# Patient Record
Sex: Female | Born: 1980
Health system: Southern US, Community
[De-identification: ages and names within clinical notes are randomized; demographics above are authoritative.]

## PROBLEM LIST (undated history)

## (undated) ENCOUNTER — Emergency Department (HOSPITAL_COMMUNITY): Payer: Medicaid Other

## (undated) DIAGNOSIS — G43909 Migraine, unspecified, not intractable, without status migrainosus: Secondary | ICD-10-CM

## (undated) DIAGNOSIS — I219 Acute myocardial infarction, unspecified: Secondary | ICD-10-CM

## (undated) DIAGNOSIS — I209 Angina pectoris, unspecified: Secondary | ICD-10-CM

## (undated) DIAGNOSIS — K219 Gastro-esophageal reflux disease without esophagitis: Secondary | ICD-10-CM

## (undated) DIAGNOSIS — G709 Myoneural disorder, unspecified: Secondary | ICD-10-CM

## (undated) DIAGNOSIS — E785 Hyperlipidemia, unspecified: Secondary | ICD-10-CM

## (undated) DIAGNOSIS — Z87442 Personal history of urinary calculi: Secondary | ICD-10-CM

## (undated) DIAGNOSIS — M1990 Unspecified osteoarthritis, unspecified site: Secondary | ICD-10-CM

## (undated) DIAGNOSIS — R0789 Other chest pain: Secondary | ICD-10-CM

## (undated) DIAGNOSIS — N189 Chronic kidney disease, unspecified: Secondary | ICD-10-CM

## (undated) DIAGNOSIS — N132 Hydronephrosis with renal and ureteral calculous obstruction: Secondary | ICD-10-CM

## (undated) DIAGNOSIS — F419 Anxiety disorder, unspecified: Secondary | ICD-10-CM

## (undated) DIAGNOSIS — I509 Heart failure, unspecified: Secondary | ICD-10-CM

## (undated) DIAGNOSIS — R0600 Dyspnea, unspecified: Secondary | ICD-10-CM

## (undated) DIAGNOSIS — T7840XA Allergy, unspecified, initial encounter: Secondary | ICD-10-CM

## (undated) DIAGNOSIS — F329 Major depressive disorder, single episode, unspecified: Secondary | ICD-10-CM

## (undated) DIAGNOSIS — I2510 Atherosclerotic heart disease of native coronary artery without angina pectoris: Secondary | ICD-10-CM

## (undated) DIAGNOSIS — I10 Essential (primary) hypertension: Secondary | ICD-10-CM

## (undated) DIAGNOSIS — G4730 Sleep apnea, unspecified: Secondary | ICD-10-CM

## (undated) DIAGNOSIS — F32A Depression, unspecified: Secondary | ICD-10-CM

## (undated) DIAGNOSIS — D649 Anemia, unspecified: Secondary | ICD-10-CM

## (undated) DIAGNOSIS — N138 Other obstructive and reflux uropathy: Secondary | ICD-10-CM

## (undated) DIAGNOSIS — R002 Palpitations: Secondary | ICD-10-CM

## (undated) DIAGNOSIS — N200 Calculus of kidney: Secondary | ICD-10-CM

## (undated) HISTORY — DX: Anemia, unspecified: D64.9

## (undated) HISTORY — DX: Heart failure, unspecified: I50.9

## (undated) HISTORY — DX: Hyperlipidemia, unspecified: E78.5

## (undated) HISTORY — DX: Essential (primary) hypertension: I10

## (undated) HISTORY — PX: CHOLECYSTECTOMY: SHX55

## (undated) HISTORY — DX: Calculus of kidney: N20.0

## (undated) HISTORY — PX: CARPAL TUNNEL RELEASE: SHX101

## (undated) HISTORY — DX: Other chest pain: R07.89

## (undated) HISTORY — DX: Palpitations: R00.2

## (undated) HISTORY — DX: Other obstructive and reflux uropathy: N13.8

## (undated) HISTORY — DX: Sleep apnea, unspecified: G47.30

## (undated) HISTORY — DX: Major depressive disorder, single episode, unspecified: F32.9

## (undated) HISTORY — PX: OTHER SURGICAL HISTORY: SHX169

## (undated) HISTORY — DX: Atherosclerotic heart disease of native coronary artery without angina pectoris: I25.10

## (undated) HISTORY — DX: Hydronephrosis with renal and ureteral calculous obstruction: N13.2

## (undated) HISTORY — DX: Allergy, unspecified, initial encounter: T78.40XA

## (undated) HISTORY — DX: Depression, unspecified: F32.A

## (undated) SURGERY — CYSTOURETEROSCOPY, WITH STENT INSERTION
Anesthesia: General | Laterality: Left

## (undated) SURGERY — LEFT HEART CATHETERIZATION WITH CORONARY ANGIOGRAM
Anesthesia: Moderate Sedation

---

## 2004-11-18 ENCOUNTER — Observation Stay (HOSPITAL_COMMUNITY): Admission: EM | Admit: 2004-11-18 | Discharge: 2004-11-19 | Payer: Self-pay | Admitting: Emergency Medicine

## 2004-11-18 ENCOUNTER — Ambulatory Visit: Payer: Self-pay | Admitting: Family Medicine

## 2005-05-29 ENCOUNTER — Emergency Department (HOSPITAL_COMMUNITY): Admission: EM | Admit: 2005-05-29 | Discharge: 2005-05-30 | Payer: Self-pay | Admitting: Emergency Medicine

## 2005-06-07 ENCOUNTER — Ambulatory Visit: Payer: Self-pay | Admitting: Orthopedic Surgery

## 2011-09-28 DIAGNOSIS — I2510 Atherosclerotic heart disease of native coronary artery without angina pectoris: Secondary | ICD-10-CM

## 2011-09-28 DIAGNOSIS — I219 Acute myocardial infarction, unspecified: Secondary | ICD-10-CM

## 2011-09-28 HISTORY — DX: Atherosclerotic heart disease of native coronary artery without angina pectoris: I25.10

## 2011-09-28 HISTORY — DX: Acute myocardial infarction, unspecified: I21.9

## 2011-10-08 ENCOUNTER — Encounter (HOSPITAL_COMMUNITY)
Admission: EM | Disposition: A | Discharge status: Home / Self Care | Payer: Self-pay | Source: Ambulatory Visit | Attending: Cardiovascular Disease

## 2011-10-08 ENCOUNTER — Encounter (HOSPITAL_COMMUNITY): Payer: Self-pay | Admitting: Internal Medicine

## 2011-10-08 ENCOUNTER — Inpatient Hospital Stay (HOSPITAL_COMMUNITY)
Admission: EM | Admit: 2011-10-08 | Discharge: 2011-10-11 | DRG: 249 | Disposition: A | Discharge status: Home / Self Care | Payer: Medicaid Other | Source: Ambulatory Visit | Attending: Cardiovascular Disease | Admitting: Cardiovascular Disease

## 2011-10-08 DIAGNOSIS — F17200 Nicotine dependence, unspecified, uncomplicated: Secondary | ICD-10-CM | POA: Diagnosis present

## 2011-10-08 DIAGNOSIS — I252 Old myocardial infarction: Secondary | ICD-10-CM | POA: Diagnosis present

## 2011-10-08 DIAGNOSIS — Z7902 Long term (current) use of antithrombotics/antiplatelets: Secondary | ICD-10-CM

## 2011-10-08 DIAGNOSIS — Z882 Allergy status to sulfonamides status: Secondary | ICD-10-CM

## 2011-10-08 DIAGNOSIS — I2109 ST elevation (STEMI) myocardial infarction involving other coronary artery of anterior wall: Secondary | ICD-10-CM

## 2011-10-08 DIAGNOSIS — E785 Hyperlipidemia, unspecified: Secondary | ICD-10-CM | POA: Diagnosis present

## 2011-10-08 DIAGNOSIS — Z720 Tobacco use: Secondary | ICD-10-CM

## 2011-10-08 DIAGNOSIS — R079 Chest pain, unspecified: Secondary | ICD-10-CM

## 2011-10-08 DIAGNOSIS — Z79899 Other long term (current) drug therapy: Secondary | ICD-10-CM

## 2011-10-08 DIAGNOSIS — Z23 Encounter for immunization: Secondary | ICD-10-CM

## 2011-10-08 DIAGNOSIS — E119 Type 2 diabetes mellitus without complications: Secondary | ICD-10-CM | POA: Diagnosis present

## 2011-10-08 DIAGNOSIS — E1169 Type 2 diabetes mellitus with other specified complication: Secondary | ICD-10-CM | POA: Diagnosis present

## 2011-10-08 DIAGNOSIS — I2510 Atherosclerotic heart disease of native coronary artery without angina pectoris: Secondary | ICD-10-CM | POA: Diagnosis present

## 2011-10-08 DIAGNOSIS — E782 Mixed hyperlipidemia: Secondary | ICD-10-CM | POA: Diagnosis present

## 2011-10-08 DIAGNOSIS — Z7985 Type 2 diabetes mellitus without complications: Secondary | ICD-10-CM

## 2011-10-08 HISTORY — PX: CARDIAC CATHETERIZATION: SHX172

## 2011-10-08 HISTORY — DX: Morbid (severe) obesity due to excess calories: E66.01

## 2011-10-08 HISTORY — PX: LEFT HEART CATHETERIZATION WITH CORONARY ANGIOGRAM: SHX5451

## 2011-10-08 HISTORY — DX: Migraine, unspecified, not intractable, without status migrainosus: G43.909

## 2011-10-08 HISTORY — PX: CORONARY ANGIOPLASTY WITH STENT PLACEMENT: SHX49

## 2011-10-08 SURGERY — LEFT HEART CATHETERIZATION WITH CORONARY ANGIOGRAM
Anesthesia: LOCAL

## 2011-10-08 MED ORDER — BIVALIRUDIN 250 MG IV SOLR
INTRAVENOUS | Status: AC
  Filled 2011-10-08: qty 250

## 2011-10-08 MED ORDER — LIDOCAINE HCL (PF) 1 % IJ SOLN
INTRAMUSCULAR | Status: AC
  Filled 2011-10-08: qty 30

## 2011-10-08 MED ORDER — FENTANYL CITRATE 0.05 MG/ML IJ SOLN
INTRAMUSCULAR | Status: AC
  Filled 2011-10-08: qty 2

## 2011-10-08 MED ORDER — MIDAZOLAM HCL 2 MG/2ML IJ SOLN
INTRAMUSCULAR | Status: AC
  Filled 2011-10-08: qty 2

## 2011-10-08 MED ORDER — HEPARIN (PORCINE) IN NACL 2-0.9 UNIT/ML-% IJ SOLN
INTRAMUSCULAR | Status: AC
  Filled 2011-10-08: qty 2000

## 2011-10-08 MED ORDER — HEPARIN SODIUM (PORCINE) 1000 UNIT/ML IJ SOLN
INTRAMUSCULAR | Status: AC
  Filled 2011-10-08: qty 1

## 2011-10-08 MED ORDER — VERAPAMIL HCL 2.5 MG/ML IV SOLN
INTRAVENOUS | Status: AC
  Filled 2011-10-08: qty 2

## 2011-10-08 MED ORDER — NITROGLYCERIN 0.2 MG/ML ON CALL CATH LAB
INTRAVENOUS | Status: AC
  Filled 2011-10-08: qty 1

## 2011-10-08 NOTE — H&P (Signed)
Cardiology History and Physical   History of Present Illness (and review of medical records): [name] is a 31 y.o. female who presents for evaluation of chest pain. Onset was approximately two hours ago, with persistent course since that time.  The patient admits to chest discomfort that feels like someone sitting on her chest, located in the mid chest with sharp pain radiating to her left arm. Currently rated as a scale of 5/10 in intensity.  Associated symptoms include nausea. Aggravating factors are none.  Alleviating factors are none.  She called EMS and was taken to [facility].  She initially ecg revealed sinus rhythm with PVCs.  Follow up ecg then revealed ST elevation in leads V1- V5.  CODE STEMI was activated.  Patient received Nitro, Zofran, Heparin bolus, Plavix load prior to transport.  She has had no previous cardiac testing.  Past Medical History  Diagnosis Date  . Morbid obesity   . Migraines     Past Surgical History  Procedure Date  . Cholecystectomy   . Cesarean section     No prescriptions prior to admission   Allergies  Allergen Reactions  . Aspirin Other (See Comments)    Tachycardia  . Sulfa Drugs Cross Reactors Other (See Comments)    Unknown    History  Substance Use Topics  . Smoking status: Current Everyday Smoker -- 0.5 packs/day    Types: Cigarettes  . Smokeless tobacco: Not on file  . Alcohol Use: No    Family History  Problem Relation Age of Onset  . Heart attack Father     Review of Systems A comprehensive review of systems was negative other than stated in HPI.  Objective:  No data found.  General Appearance:    Alert, cooperative, mod distress, obese female  Head:    Normocephalic, without obvious abnormality, atraumatic  Eyes:     PERRL, EOMI, anicteric sclerae  Neck:   Supple, no carotid bruit or JVD  Lungs:     Clear to auscultation bilaterally  Heart:    Regular rate and rhythm, S1 and S2 normal   Abdomen:     Soft,  non-tender, normoactive bowel sounds  Extremities:   Extremiteis nontraumatic  Pulses:   2+ and symmetric all extremities  Skin:   no rashes or lesions  Neurologic:   No focal deficits. AAO x3  No results found for this or any previous visit (from the past 48 hour(s)). No results found.  ECG:  First at 21:23 prob sinus rhythm with PVCS HR 89 2nd 21:50 Sinus with ST elevation in V1-V5, ST depression 1, avl  Assessment: Acute Anterior STEMI   Plan:  1. Direct Admit to Cath Lab for cardiac catheterization with likely intervention 2. Received Heparin bolus and Plavix 600mg  prior to arrival 3. Will Admit to CCU post cardiac catheterization 4. Medical management with Plavix/BB/Statin/Nitro prn 5. ASA allergy 6. Check lipids, hgba1c, tsh 6.  Tobacco cessation

## 2011-10-09 ENCOUNTER — Other Ambulatory Visit: Payer: Self-pay

## 2011-10-09 ENCOUNTER — Encounter (HOSPITAL_COMMUNITY): Payer: Self-pay | Admitting: *Deleted

## 2011-10-09 DIAGNOSIS — E785 Hyperlipidemia, unspecified: Secondary | ICD-10-CM | POA: Diagnosis present

## 2011-10-09 DIAGNOSIS — Z720 Tobacco use: Secondary | ICD-10-CM | POA: Diagnosis present

## 2011-10-09 DIAGNOSIS — I2109 ST elevation (STEMI) myocardial infarction involving other coronary artery of anterior wall: Secondary | ICD-10-CM

## 2011-10-09 DIAGNOSIS — E782 Mixed hyperlipidemia: Secondary | ICD-10-CM | POA: Diagnosis present

## 2011-10-09 DIAGNOSIS — I2510 Atherosclerotic heart disease of native coronary artery without angina pectoris: Secondary | ICD-10-CM

## 2011-10-09 DIAGNOSIS — I252 Old myocardial infarction: Secondary | ICD-10-CM | POA: Diagnosis present

## 2011-10-09 DIAGNOSIS — E1169 Type 2 diabetes mellitus with other specified complication: Secondary | ICD-10-CM | POA: Diagnosis present

## 2011-10-09 LAB — BASIC METABOLIC PANEL
BUN: 9 mg/dL (ref 6–23)
CO2: 24 mEq/L (ref 19–32)
Calcium: 8.8 mg/dL (ref 8.4–10.5)
Chloride: 103 mEq/L (ref 96–112)
Creatinine, Ser: 0.59 mg/dL (ref 0.50–1.10)
GFR calc Af Amer: >90 mL/min (ref >90)
GFR calc non Af Amer: >90 mL/min (ref >90)
Glucose, Bld: 170 mg/dL — ABNORMAL HIGH (ref 70–99)
Potassium: 3.7 mEq/L (ref 3.5–5.1)
Sodium: 137 mEq/L (ref 135–145)

## 2011-10-09 LAB — CBC
HCT: 36.8 % (ref 36.0–46.0)
Hemoglobin: 13 g/dL (ref 12.0–15.0)
MCH: 30.7 pg (ref 26.0–34.0)
MCHC: 35.3 g/dL (ref 30.0–36.0)
MCV: 87 fL (ref 78.0–100.0)
Platelets: 207 10*3/uL (ref 150–400)
RBC: 4.23 MIL/uL (ref 3.87–5.11)
RDW: 12.7 % (ref 11.5–15.5)
WBC: 11.9 10*3/uL — ABNORMAL HIGH (ref 4.0–10.5)

## 2011-10-09 LAB — CARDIAC PANEL(CRET KIN+CKTOT+MB+TROPI)
CK, MB: 62.4 ng/mL (ref 0.3–4.0)
CK, MB: 57.1 ng/mL (ref 0.3–4.0)
Relative Index: 8.4 — ABNORMAL HIGH (ref 0.0–2.5)
Relative Index: 8.1 — ABNORMAL HIGH (ref 0.0–2.5)
Total CK: 743 U/L — ABNORMAL HIGH (ref 7–177)
Total CK: 702 U/L — ABNORMAL HIGH (ref 7–177)
Troponin I: 20.07 ng/mL (ref <0.30)
Troponin I: 11.02 ng/mL (ref <0.30)

## 2011-10-09 LAB — GLUCOSE, CAPILLARY
Glucose-Capillary: 140 mg/dL — ABNORMAL HIGH (ref 70–99)
Glucose-Capillary: 138 mg/dL — ABNORMAL HIGH (ref 70–99)

## 2011-10-09 LAB — MRSA PCR SCREENING: MRSA by PCR: NEGATIVE

## 2011-10-09 LAB — LIPID PANEL
Cholesterol: 203 mg/dL — ABNORMAL HIGH (ref 0–200)
HDL: 27 mg/dL — ABNORMAL LOW (ref >39)
LDL Cholesterol: 125 mg/dL — ABNORMAL HIGH (ref 0–99)
Total CHOL/HDL Ratio: 7.5 RATIO
Triglycerides: 257 mg/dL — ABNORMAL HIGH (ref <150)
VLDL: 51 mg/dL — ABNORMAL HIGH (ref 0–40)

## 2011-10-09 LAB — HEMOGLOBIN A1C
Hgb A1c MFr Bld: 7.8 % — ABNORMAL HIGH (ref <5.7)
Mean Plasma Glucose: 177 mg/dL — ABNORMAL HIGH (ref <117)

## 2011-10-09 LAB — PROTIME-INR
INR: 1.17 (ref 0.00–1.49)
Prothrombin Time: 15.1 seconds (ref 11.6–15.2)

## 2011-10-09 LAB — TSH: TSH: 0.998 u[IU]/mL (ref 0.350–4.500)

## 2011-10-09 MED ORDER — NITROGLYCERIN 0.4 MG SL SUBL: 0.4000 mg | SUBLINGUAL_TABLET | SUBLINGUAL | Status: DC | PRN

## 2011-10-09 MED ORDER — ACETAMINOPHEN 325 MG PO TABS
650.0000 mg | ORAL_TABLET | ORAL | Status: DC | PRN
  Administered 2011-10-09 – 2011-10-11 (×3): 650 mg via ORAL
  Filled 2011-10-09 (×3): qty 2

## 2011-10-09 MED ORDER — CARVEDILOL 6.25 MG PO TABS
6.2500 mg | ORAL_TABLET | Freq: Two times a day (BID) | ORAL | Status: DC
  Administered 2011-10-09 – 2011-10-10 (×4): 6.25 mg via ORAL
  Filled 2011-10-09 (×7): qty 1

## 2011-10-09 MED ORDER — ENOXAPARIN SODIUM 100 MG/ML ~~LOC~~ SOLN
90.0000 mg | SUBCUTANEOUS | Status: DC
  Administered 2011-10-09 – 2011-10-10 (×2): 90 mg via SUBCUTANEOUS
  Filled 2011-10-09 (×3): qty 1

## 2011-10-09 MED ORDER — INSULIN ASPART 100 UNIT/ML ~~LOC~~ SOLN
0.0000 [IU] | Freq: Three times a day (TID) | SUBCUTANEOUS | Status: DC
  Administered 2011-10-10: 2 [IU] via SUBCUTANEOUS
  Administered 2011-10-10: 3 [IU] via SUBCUTANEOUS
  Administered 2011-10-10 – 2011-10-11 (×2): 2 [IU] via SUBCUTANEOUS
  Filled 2011-10-09: qty 3

## 2011-10-09 MED ORDER — MORPHINE SULFATE 2 MG/ML IJ SOLN: 2.0000 mg | INTRAMUSCULAR | Status: DC | PRN

## 2011-10-09 MED ORDER — CLOPIDOGREL BISULFATE 75 MG PO TABS
75.0000 mg | ORAL_TABLET | Freq: Every day | ORAL | Status: DC
  Administered 2011-10-09 – 2011-10-11 (×3): 75 mg via ORAL
  Filled 2011-10-09 (×4): qty 1

## 2011-10-09 MED ORDER — SPIRONOLACTONE 12.5 MG HALF TABLET
12.5000 mg | ORAL_TABLET | Freq: Every day | ORAL | Status: DC
  Administered 2011-10-09 – 2011-10-11 (×3): 12.5 mg via ORAL
  Filled 2011-10-09 (×3): qty 1

## 2011-10-09 MED ORDER — SODIUM CHLORIDE 0.9 % IJ SOLN
3.0000 mL | Freq: Two times a day (BID) | INTRAMUSCULAR | Status: AC
  Administered 2011-10-09 – 2011-10-11 (×4): 3 mL via INTRAVENOUS

## 2011-10-09 MED ORDER — NICOTINE 21 MG/24HR TD PT24
21.0000 mg | MEDICATED_PATCH | Freq: Every day | TRANSDERMAL | Status: DC
  Administered 2011-10-09 – 2011-10-10 (×2): 21 mg via TRANSDERMAL
  Filled 2011-10-09 (×3): qty 1

## 2011-10-09 MED ORDER — ROSUVASTATIN CALCIUM 40 MG PO TABS
40.0000 mg | ORAL_TABLET | Freq: Every day | ORAL | Status: DC
  Administered 2011-10-09 – 2011-10-10 (×2): 40 mg via ORAL
  Filled 2011-10-09 (×3): qty 1

## 2011-10-09 MED ORDER — INFLUENZA VIRUS VACC SPLIT PF IM SUSP
0.5000 mL | INTRAMUSCULAR | Status: AC
  Administered 2011-10-10: 0.5 mL via INTRAMUSCULAR
  Filled 2011-10-09: qty 0.5

## 2011-10-09 MED ORDER — PNEUMOCOCCAL VAC POLYVALENT 25 MCG/0.5ML IJ INJ
0.5000 mL | INJECTION | INTRAMUSCULAR | Status: AC
  Administered 2011-10-10: 0.5 mL via INTRAMUSCULAR
  Filled 2011-10-09: qty 0.5

## 2011-10-09 MED ORDER — SODIUM CHLORIDE 0.9 % IV SOLN
1.0000 mL/kg/h | INTRAVENOUS | Status: AC
  Administered 2011-10-09: 1.307 mL/kg/h via INTRAVENOUS

## 2011-10-09 MED ORDER — ONDANSETRON HCL 4 MG/2ML IJ SOLN
4.0000 mg | Freq: Four times a day (QID) | INTRAMUSCULAR | Status: DC | PRN
  Administered 2011-10-09: 4 mg via INTRAVENOUS

## 2011-10-09 MED ORDER — ONDANSETRON HCL 4 MG/2ML IJ SOLN
INTRAMUSCULAR | Status: AC
  Administered 2011-10-09: 4 mg via INTRAVENOUS
  Filled 2011-10-09: qty 2

## 2011-10-09 MED ORDER — METOPROLOL TARTRATE 25 MG PO TABS
25.0000 mg | ORAL_TABLET | Freq: Two times a day (BID) | ORAL | Status: DC
Start: 2011-10-09 — End: 2011-10-09

## 2011-10-09 NOTE — Progress Notes (Signed)
CARDIAC REHAB PHASE I   PRE:  Rate/Rhythm: 98SR  BP:  Supine: 139/77  Sitting:   Standing:    SaO2: 98%2L  MODE:  Ambulation: 150 ft   POST:  Rate/Rhythem: 103ST  BP:  Supine:   Sitting: 147/92  Standing:    SaO2: 95%RA 0900-1010 [name] with light pressure in chest but not pain as on adm. Walked 150 ft with asst x 1 with very slow pace. To recliner with call bell. Discussed Phase 2 and Katelyn gave permission to refer to [facility]. Encourage smoking cessation and gave handout. Katelyn was craving 4 on scale 0-4 so asked RN to check on nicotine patch. Began ed. Katelyn needs lots of reenforcement. Left diet handout and will go over diet on Monday.  Duanne Limerick

## 2011-10-09 NOTE — Progress Notes (Addendum)
Subjective:   [name] is a 31 year old white female with history of tobacco abuse and morbid obesity who was transferred from [facility] for an acute ST elevation anterior myocardial infarction on 10/08/11. She has 2 mm of ST elevation anterior precordial leads.   Cath with high grade lesion in LAD and Ramus. LAD treated with BMS. Ramus with cutting balloon. EF 55%.  Doing OK. Mild residual CP. Mild nausea. No CP or SOB. Radial site ok    Intake/Output Summary (Last 24 hours) at 10/09/11 0828 Last data filed at 10/09/11 0700  Gross per 24 hour  Intake   1098 ml  Output    850 ml  Net    248 ml    Current meds:    . bivalirudin      . clopidogrel  75 mg Oral Q breakfast  . fentaNYL      . heparin      . heparin      . influenza  inactive virus vaccine  0.5 mL Intramuscular Tomorrow-1000  . lidocaine      . metoprolol tartrate  25 mg Oral BID  . midazolam      . nitroGLYCERIN      . pneumococcal 23 valent vaccine  0.5 mL Intramuscular Tomorrow-1000  . rosuvastatin  40 mg Oral q1800  . verapamil       Infusions:    . sodium chloride 1.307 mL/kg/hr (10/09/11 0200)     Objective:  Blood pressure 136/73, pulse 80, temperature 98.5 F (36.9 C), temperature source Oral, resp. rate 18, height 5\' 10"  (1.778 m), weight 183 kg (403 lb 7.1 oz), SpO2 91.00%. Weight change:   Physical Exam: General:  Obese.No resp difficulty HEENT: normal. + hirsuit Neck: supple. JVP unable to see . Carotids 2+ bilat; no bruits. No lymphadenopathy or thryomegaly appreciated. Cor: PMI nonpalpable Regular rate & rhythm. No rubs, gallops or murmurs. Lungs: clear Abdomen: obese soft, nontender, nondistended.  Extremities: no cyanosis, clubbing, rash, tr edema. r radial site ok Neuro: alert & orientedx3, cranial nerves grossly intact. moves all 4 extremities w/o difficulty. Affect pleasant  Telemetry:  Sinus rhythm  Lab Results: Basic Metabolic Panel: No results found for this  basename: NA:5,K:2,CL:5,CO2:5,GLUCOSE:5,BUN:5,CREATININE:5,CALCIUM:5,MG:5,PHOS:5 in the last 168 hours Liver Function Tests: No results found for this basename: AST:5,ALT:5,ALKPHOS:5,BILITOT:5,PROT:5,ALBUMIN:5 in the last 168 hours No results found for this basename: LIPASE:5,AMYLASE:5 in the last 168 hours No results found for this basename: AMMONIA:5 in the last 168 hours CBC:  Lab 10/09/11 0630  WBC 11.9*  NEUTROABS --  HGB 13.0  HCT 36.8  MCV 87.0  PLT 207   Cardiac Enzymes:  Lab 10/09/11 0600  CKTOTAL 743*  CKMB 62.4*  CKMBINDEX --  TROPONINI 20.07*   BNP: No components found with this basename: POCBNP:5 CBG: No results found for this basename: GLUCAP:5 in the last 168 hours Microbiology: No results found for this basename: cult   No results found for this basename: CULT:2,SDES:2 in the last 168 hours  Imaging: No results found.  ECG:  SR 83. Evolving anterolateral MI. No ST elevation.   ASSESSMENT:   1. Anterior STEMI 1/11    --s/p BMS to LAD and cutting balloon of ramus 2. Morbid obesity 3. Tobacco abuse 4. Probable OSA  PLAN/DISCUSSION:  Doing OK. Continue b-blocker, statin and plavix. Cardiac rehab to see. Given Top >20 and ECG findings suspect EF will not remain 55%. Check echo. Change lopressor to carvedilol. Add low dose spiro.    Need for  smoking cessation reinforced. Will check CE to make sure CE coming down. If CE coming down can transfer to tele. Will need outpt sleep study.   LOS: 1 day    Arvilla Meres, MD 10/09/2011, 8:28 AM

## 2011-10-09 NOTE — Op Note (Signed)
Cardiac Catheterization Procedure Note  Name: [name] MRN: [id number] DOB: [birth year]  Procedure: Left Heart Cath, Selective Coronary Angiography, LV angiography, PTCA and stenting of the Mid LAD, cutting balloon angioplasty of the ramus intermediate   Indication: 31 year old white female with history of tobacco abuse and morbid obesity who is transferred from [facility] for an acute ST elevation anterior myocardial infarction. She has 2 mm of ST elevation anterior precordial leads. She has ongoing chest pain.  Procedural Details:  The right wrist was prepped, draped, and anesthetized with 1% lidocaine. Using the modified Seldinger technique, a 5 French sheath was introduced into the right radial artery. 3 mg of verapamil was administered through the sheath, Angiomax was administered intravenously. Standard Judkins catheters were used for selective coronary angiography and left ventriculography. Catheter exchanges were performed over an exchange length guidewire.  PROCEDURAL FINDINGS Hemodynamics: AO  135/92 with a mean of 114 mmHg LV 136/25 mmHg   Coronary angiography: Coronary dominance: Left  Left mainstem: Normal.  Left anterior descending (LAD): There is a 95% stenosis in the mid LAD immediately after the takeoff of the first diagonal. The first diagonal branch is moderate in size and appears normal. The LAD stenosis does appear to be hazy.  There is a moderately large ramus intermediate branch which has a 95% ostial stenosis.  Left circumflex (LCx): This is a dominant vessel and appears normal throughout.  Right coronary artery (RCA): This is a small nondominant vessel and is normal.  Left ventriculography: Left ventricular systolic function is abnormal with apical hypokinesis. LVEF is estimated at 55%, there is no significant mitral regurgitation   PCI Note:  Following the diagnostic procedure, the decision was made to proceed with PCI of the LAD.  Weight-based  bivalirudin was given for anticoagulation. Once a therapeutic ACT was achieved, a 5 [place] EBU guide catheter was inserted.  A pro-water coronary guidewire was used to cross the lesion.  The lesion was predilated with a 2.5 mm balloon.  The lesion was then stented with a 3.0 x 18 mm vision stent.  The stent was postdilated with a 3.25 mm noncompliant balloon.  Following PCI, there was 0% residual stenosis and TIMI-3 flow. At this point the patient's pain was improved but was Tauer of moderate intensity. We then proceeded to intervene on the ostial ramus intermediate lesion. This was crossed with the prowater wire. This was dilated using a 3.0 x 10 mm cutting balloon performing 3 inflations to 6 atmospheres. This showed a good angiographic result with less than 30% residual stenosis and TIMI grade 3 flow. Final angiography confirmed an excellent result. The patient tolerated the procedure well. There were no immediate procedural complications. A TR band was used for radial hemostasis. The patient was transferred to the post catheterization recovery area for further monitoring.  PCI Data: Vessel -  LAD/Segment - mid vessel immediately after the takeoff of the first diagonal. Percent Stenosis (pre)  95% TIMI-flow 3. Stent 3.0 x 18 mm vision stent. Percent Stenosis (post) 0% TIMI-flow (post) 3.  Second vessel: Ostial ramus intermediate branch Percent stenosis: 95%. TIMI flow 3. 3.0 mm cutting balloon Percent stenosis (post) less than 30% TIMI flow 3  Final Conclusions:   1. Severe 2 vessel obstructive coronary disease. 2. Overall well preserved LV systolic function with apical wall motion abnormality and ejection fraction of 55%. 3. Successful intracoronary stenting of the mid LAD with a bare-metal stent. 4. Successful cutting balloon angioplasty of the ramus intermediate branch.  Recommendations:  Continue therapy with Plavix. Patient reports a history of allergy to aspirin. Aggressive risk  factor modification.  Disposition: Patient be observed in the intensive care unit tonight. If she has no complications she should be able to be transferred to telemetry in the morning.  Theron Arista [facility] 10/09/2011, 12:20 AM

## 2011-10-09 NOTE — Progress Notes (Signed)
ANTICOAGULATION CONSULT NOTE - Initial Consult  Pharmacy Consult for Lovenox Indication: VTE prophylaxis  Allergies  Allergen Reactions  . Aspirin Other (See Comments)    Tachycardia  . Sulfa Drugs Cross Reactors Other (See Comments)    Unknown    Patient Measurements: Height: 5\' 10"  (177.8 cm) Weight: 403 lb 7.1 oz (183 kg) IBW/kg (Calculated) : 68.5  Adjusted Body Weight:   Vital Signs: Temp: 98.5 F (36.9 C) (01/12 0812) Temp src: Oral (01/12 0812) BP: 146/80 mmHg (01/12 1001) Pulse Rate: 84  (01/12 1000)  Labs:  Basename 10/09/11 0630 10/09/11 0600 10/09/11 0500  HGB 13.0 -- --  HCT 36.8 -- --  PLT 207 -- --  APTT -- -- --  LABPROT -- -- 15.1  INR -- -- 1.17  HEPARINUNFRC -- -- --  CREATININE 0.59 -- --  CKTOTAL -- 743* --  CKMB -- 62.4* --  TROPONINI -- 20.07* --   Estimated Creatinine Clearance: 182.2 ml/min (by C-G formula based on Cr of 0.59).  Medical History: Past Medical History  Diagnosis Date  . Morbid obesity   . Migraines     Assessment: 30 yoWF with hx tobacco abuse and morbid obesity s/p cardiac cath for STEMI on 01/11.   Pharmacy asked to dose lovenox for VTE prophylaxis.  CrCl 182 ml/min.  BMI 57.8 (weight 183 kg, height 5'10").  Will dose LMWH as 0.5mg /kg q 24h.  No bleeding noted.   Pt also on plavix,    Plan:  1.  Lovenox 90mg  SQ q 24 hours 2.  F/u renal fxn, duration, clinical course.    Haynes Hoehn, PharmD 10/09/2011 11:39 AM  Pager: [phone]

## 2011-10-10 DIAGNOSIS — E119 Type 2 diabetes mellitus without complications: Secondary | ICD-10-CM

## 2011-10-10 DIAGNOSIS — I369 Nonrheumatic tricuspid valve disorder, unspecified: Secondary | ICD-10-CM

## 2011-10-10 DIAGNOSIS — I2109 ST elevation (STEMI) myocardial infarction involving other coronary artery of anterior wall: Principal | ICD-10-CM

## 2011-10-10 DIAGNOSIS — Z7985 Type 2 diabetes mellitus without complications: Secondary | ICD-10-CM

## 2011-10-10 LAB — BASIC METABOLIC PANEL
BUN: 8 mg/dL (ref 6–23)
CO2: 25 mEq/L (ref 19–32)
Calcium: 9.1 mg/dL (ref 8.4–10.5)
Chloride: 100 mEq/L (ref 96–112)
Creatinine, Ser: 0.61 mg/dL (ref 0.50–1.10)
GFR calc Af Amer: >90 mL/min (ref >90)
GFR calc non Af Amer: >90 mL/min (ref >90)
Glucose, Bld: 263 mg/dL — ABNORMAL HIGH (ref 70–99)
Potassium: 3.6 mEq/L (ref 3.5–5.1)
Sodium: 136 mEq/L (ref 135–145)

## 2011-10-10 LAB — GLUCOSE, CAPILLARY
Glucose-Capillary: 151 mg/dL — ABNORMAL HIGH (ref 70–99)
Glucose-Capillary: 129 mg/dL — ABNORMAL HIGH (ref 70–99)
Glucose-Capillary: 177 mg/dL — ABNORMAL HIGH (ref 70–99)

## 2011-10-10 MED ORDER — LISINOPRIL 5 MG PO TABS
5.0000 mg | ORAL_TABLET | Freq: Every day | ORAL | Status: DC
  Administered 2011-10-10 – 2011-10-11 (×2): 5 mg via ORAL
  Filled 2011-10-10 (×3): qty 1

## 2011-10-10 MED ORDER — METFORMIN HCL 500 MG PO TABS
500.0000 mg | ORAL_TABLET | Freq: Two times a day (BID) | ORAL | Status: DC
  Administered 2011-10-11: 500 mg via ORAL
  Filled 2011-10-10 (×3): qty 1

## 2011-10-10 NOTE — Progress Notes (Signed)
In process of being transferred to 2900 via w/c with monitor

## 2011-10-10 NOTE — Progress Notes (Signed)
  Echocardiogram 2D Echocardiogram has been performed.  [name] [name] 10/10/2011, 10:12 AM

## 2011-10-10 NOTE — Progress Notes (Signed)
See note from earlier today.  Nurse called needing order for transfer.  Ok to transfer to tele. Tereso Newcomer, PA-C  8:45 PM 10/10/2011

## 2011-10-10 NOTE — Progress Notes (Signed)
Subjective:   [name] is a 31 year old white female with history of tobacco abuse and morbid obesity who was transferred from [facility] for an acute ST elevation anterior myocardial infarction on evening of 10/08/11. She has 2 mm of ST elevation anterior precordial leads.   Cath with high grade lesion in LAD and Ramus. LAD treated with BMS. Ramus with cutting balloon. EF 55%.  Improving. Feels much better! Walking halls with Cardiac Rehab. Cardiac enzymes washing out slowly. No further CP/SOB or nausea.  CBGs 140-150s.    Intake/Output Summary (Last 24 hours) at 10/10/11 0855 Last data filed at 10/10/11 0400  Gross per 24 hour  Intake   1746 ml  Output   1600 ml  Net    146 ml    Current meds:    . carvedilol  6.25 mg Oral BID WC  . clopidogrel  75 mg Oral Q breakfast  . enoxaparin (LOVENOX) injection  90 mg Subcutaneous Q24H  . influenza  inactive virus vaccine  0.5 mL Intramuscular Tomorrow-1000  . insulin aspart  0-15 Units Subcutaneous TID WC  . nicotine  21 mg Transdermal Daily  . pneumococcal 23 valent vaccine  0.5 mL Intramuscular Tomorrow-1000  . rosuvastatin  40 mg Oral q1800  . sodium chloride  3 mL Intravenous Q12H  . spironolactone  12.5 mg Oral Daily   Infusions:    . sodium chloride 1.307 mL/kg/hr (10/09/11 0200)     Objective:  Blood pressure 118/77, pulse 82, temperature 98 F (36.7 C), temperature source Oral, resp. rate 18, height 5\' 10"  (1.778 m), weight 183 kg (403 lb 7.1 oz), SpO2 98.00%. Weight change:   Physical Exam: General:  Obese.No resp difficulty HEENT: normal. + hirsuit Neck: supple. JVP unable to see . Carotids 2+ bilat; no bruits. No lymphadenopathy or thryomegaly appreciated. Cor: PMI nonpalpable Regular rate & rhythm. No rubs, gallops or murmurs. Lungs: clear Abdomen: obese soft, nontender, nondistended.  Extremities: no cyanosis, clubbing, rash, tr edema. r radial site ok Neuro: alert & orientedx3, cranial nerves grossly  intact. moves all 4 extremities w/o difficulty. Affect pleasant  Telemetry:  Sinus rhythm  Lab Results: Basic Metabolic Panel:  Lab 10/09/11 6578  NA 137  K 3.7  CL 103  CO2 24  GLUCOSE 170*  BUN 9  CREATININE 0.59  CALCIUM 8.8  MG --  PHOS --   Liver Function Tests: No results found for this basename: AST:5,ALT:5,ALKPHOS:5,BILITOT:5,PROT:5,ALBUMIN:5 in the last 168 hours No results found for this basename: LIPASE:5,AMYLASE:5 in the last 168 hours No results found for this basename: AMMONIA:5 in the last 168 hours CBC:  Lab 10/09/11 0630  WBC 11.9*  NEUTROABS --  HGB 13.0  HCT 36.8  MCV 87.0  PLT 207   Cardiac Enzymes:  Lab 10/09/11 1140 10/09/11 0600  CKTOTAL 702* 743*  CKMB 57.1* 62.4*  CKMBINDEX -- --  TROPONINI 11.02* 20.07*   BNP: No components found with this basename: POCBNP:5 CBG:  Lab 10/09/11 2113 10/09/11 1741  GLUCAP 138* 140*   Microbiology: No results found for this basename: cult   No results found for this basename: CULT:2,SDES:2 in the last 168 hours  Imaging: No results found.  ECG:  SR 83. Evolving anterolateral MI. No ST elevation.   ASSESSMENT:   1. Anterior STEMI 1/11    --s/p BMS to LAD and cutting balloon of ramus 2. Morbid obesity 3. Tobacco abuse 4. Probable OSA 5. DM2, new diagnosis  PLAN/DISCUSSION:  Doing well. Continue b-blocker, statin and  plavix.  Given Trop >20 and ECG findings suspect EF will not remain 55%. Check echo.   Transfer tele. Start metformin. Check Hgba1c. Will need outpt sleep study and PCP. Home in am.   LOS: 2 days    Arvilla Meres, MD 10/10/2011, 8:55 AM

## 2011-10-11 ENCOUNTER — Telehealth: Payer: Self-pay | Admitting: Cardiology

## 2011-10-11 ENCOUNTER — Telehealth: Payer: Self-pay | Admitting: *Deleted

## 2011-10-11 LAB — TSH: TSH: 1.17 u[IU]/mL (ref 0.350–4.500)

## 2011-10-11 LAB — POCT ACTIVATED CLOTTING TIME: Activated Clotting Time: 457 seconds

## 2011-10-11 LAB — GLUCOSE, CAPILLARY
Glucose-Capillary: 144 mg/dL — ABNORMAL HIGH (ref 70–99)
Glucose-Capillary: 135 mg/dL — ABNORMAL HIGH (ref 70–99)

## 2011-10-11 LAB — HEMOGLOBIN A1C
Hgb A1c MFr Bld: 7.4 % — ABNORMAL HIGH (ref <5.7)
Mean Plasma Glucose: 166 mg/dL — ABNORMAL HIGH (ref <117)

## 2011-10-11 MED ORDER — METFORMIN HCL 500 MG PO TABS: 500.0000 mg | ORAL_TABLET | Freq: Two times a day (BID) | ORAL | Status: DC

## 2011-10-11 MED ORDER — CARVEDILOL 12.5 MG PO TABS: 12.5000 mg | ORAL_TABLET | Freq: Two times a day (BID) | ORAL | Status: DC

## 2011-10-11 MED ORDER — CLOPIDOGREL BISULFATE 75 MG PO TABS: 75.0000 mg | ORAL_TABLET | Freq: Every day | ORAL | Status: DC

## 2011-10-11 MED ORDER — NITROGLYCERIN 0.4 MG SL SUBL: 0.4000 mg | SUBLINGUAL_TABLET | SUBLINGUAL | Status: DC | PRN

## 2011-10-11 MED ORDER — VARENICLINE TARTRATE 0.5 MG PO TABS: 0.5000 mg | ORAL_TABLET | Freq: Every day | ORAL | Status: DC

## 2011-10-11 MED ORDER — ROSUVASTATIN CALCIUM 40 MG PO TABS: 40.0000 mg | ORAL_TABLET | Freq: Every day | ORAL | Status: DC

## 2011-10-11 MED ORDER — NICOTINE 21 MG/24HR TD PT24: 1.0000 | MEDICATED_PATCH | Freq: Every day | TRANSDERMAL | Status: DC

## 2011-10-11 MED ORDER — CARVEDILOL 12.5 MG PO TABS
12.5000 mg | ORAL_TABLET | Freq: Two times a day (BID) | ORAL | Status: DC
  Administered 2011-10-11: 12.5 mg via ORAL
  Filled 2011-10-11 (×3): qty 1

## 2011-10-11 MED ORDER — NICOTINE 21 MG/24HR TD PT24: MEDICATED_PATCH | TRANSDERMAL | Status: DC

## 2011-10-11 MED ORDER — LISINOPRIL 5 MG PO TABS: 5.0000 mg | ORAL_TABLET | Freq: Every day | ORAL | Status: DC

## 2011-10-11 MED ORDER — ATORVASTATIN CALCIUM 80 MG PO TABS: 80.0000 mg | ORAL_TABLET | Freq: Every day | ORAL | Status: DC

## 2011-10-11 MED FILL — Ondansetron HCl Inj 4 MG/2ML (2 MG/ML): INTRAMUSCULAR | Qty: 2 | Status: AC

## 2011-10-11 MED FILL — Nitroglycerin IV Soln 200 MCG/ML in D5W: INTRAVENOUS | Qty: 250 | Status: AC

## 2011-10-11 MED FILL — Morphine Sulfate Inj 10 MG/ML: INTRAMUSCULAR | Qty: 1 | Status: AC

## 2011-10-11 MED FILL — Dextrose Inj 5%: INTRAVENOUS | Qty: 50 | Status: AC

## 2011-10-11 NOTE — Progress Notes (Signed)
Pt. Discharged 10/11/2011  11:31 AM Discharge instructions reviewed with patient/family. Patient/family verbalized understanding. All Rx's given. Questions answered as needed. Pt. Discharged to home with family/self. Taken off unit via W/C. Ashliegh Parekh

## 2011-10-11 NOTE — Telephone Encounter (Signed)
Discussed with Dr. Kirke Corin who states pt can have Lipitor 80 mg Take 1 tablet by mouth every night. Pt's husband notified and Rx sent to [facility].

## 2011-10-11 NOTE — Progress Notes (Signed)
   CARE MANAGEMENT NOTE 10/11/2011  Patient:  [name], [name]   Account Number:  [id number]  Date Initiated:  10/11/2011  Documentation initiated by:  GRAVES-BIGELOW,Deaundre Allston  Subjective/Objective Assessment:   Pt in with cp s/p cath. Plan for home today. Had some concerns with blood sugar machine and strips. PA wrote rx to see if insurance will pay. CM did make pt aware of places to get cheaper strips and machine.     Action/Plan:   No other needs assessed at this time.   Anticipated DC Date:  10/11/2011   Anticipated DC Plan:  HOME/SELF CARE      DC Planning Services  Medication Assistance  CM consult      Choice offered to / List presented to:             Status of service:  Completed, signed off Medicare Important Message given?   (If response is "NO", the following Medicare IM given date fields will be blank) Date Medicare IM given:   Date Additional Medicare IM given:    Discharge Disposition:  HOME/SELF CARE  Per UR Regulation:    Comments:

## 2011-10-11 NOTE — Discharge Summary (Signed)
Patient seen and examined and history reviewed. Agree with above findings and plan. See rounding note earlier today.   Theron Arista JordanMD 10/11/2011 11:04 AM

## 2011-10-11 NOTE — Telephone Encounter (Signed)
Discussed with Dr. Kirke Corin who states pt can have Lipitor 80 mg Take 1 tablet by mouth every night. Pt's husband notified and Rx sent to [facility] in [place].

## 2011-10-11 NOTE — Telephone Encounter (Signed)
New Msg: pt husband calling wanting to speak with someone regarding RX for lipitor. RX needs to be rewritten for generic. Please call this into [employer]. Please return pt husband call to inform when RX was sent.

## 2011-10-11 NOTE — Discharge Summary (Signed)
CARDIOLOGY DISCHARGE SUMMARY   Patient ID: [name] MRN: [id number] DOB/AGE: June 21, 1981 30 y.o.  Admit date: 10/08/2011 Discharge date: 10/11/2011  Primary Discharge Diagnosis:   . Acute anterior wall MI   Secondary Discharge Diagnosis:  Patient Active Problem List  Diagnoses    status post C-section and cholecystectomy   . Morbid obesity  . Tobacco abuse  . Hyperlipidemia  . DM2 (diabetes mellitus, type 2)   Procedures: Left Heart Cath, Selective Coronary Angiography, LV angiography, PTCA and stenting of the Mid LAD with a 3.0 x 18 mm bare metal Vision stent, cutting balloon angioplasty of the ramus intermediate, 2-D echo  Hospital Course: [name] is a 31 year old female with no previous cardiac issues. She had chest pain and called EMS. She was initially taken to [facility] where her initial ECG did not show any acute problem. However, a followup ECG showed lateral ST elevation. She was treated medically with nitroglycerin, heparin, Plavix and Zofran. She was transported emergently to [facility] and taken directly to the cath lab.  He had a 95% LAD that was treated with PTCA and a bare metal stent described above. A ramus intermediate branch had Cutting Balloon angioplasty which reduced the stenosis from 95% to less than 30%. There was no other significant disease noted. Her EF at cath was 55% and an echo was slightly less, 50%. She was started on a beta blocker, statin and ACE inhibitor. She was seen by cardiac rehabilitation and educated on MI restrictions and lifestyle modifications. She was seen by smoking cessation as well and started on nicotine patch.  She progressed gradually and continue to ambulate with cardiac rehabilitation. Her cardiac enzymes were trending down. On 08/10/2012 she was seen by Dr. [place]. She was ambulating without chest pain or shortness of breath and considered stable for discharge, to followup as an outpatient.   Labs:   Lab Results    Component Value Date   WBC 11.9* 10/09/2011   HGB 13.0 10/09/2011   HCT 36.8 10/09/2011   MCV 87.0 10/09/2011   PLT 207 10/09/2011    Lab 10/10/11 0929  NA 136  K 3.6  CL 100  CO2 25  BUN 8  CREATININE 0.61  CALCIUM 9.1  PROT --  BILITOT --  ALKPHOS --  ALT --  AST --  GLUCOSE 263*    Basename 10/09/11 1140 10/09/11 0600  CKTOTAL 702* 743*  CKMB 57.1* 62.4*  CKMBINDEX -- --  TROPONINI 11.02* 20.07*   Lipid Panel     Component Value Date/Time   CHOL 203* 10/09/2011 0600   TRIG 257* 10/09/2011 0600   HDL 27* 10/09/2011 0600   CHOLHDL 7.5 10/09/2011 0600   VLDL 51* 10/09/2011 0600   LDLCALC 125* 10/09/2011 0600     EKG: 09-Oct-2011 07:42:07  Normal sinus rhythm Possible Inferior infarct , age undetermined Anteroseptal infarct , age undetermined - likely recent ST & T wave abnormality, consider lateral ischemia and septal ischemia (vs. evolving MI) Prolonged QT Abnormal ECG No significant change since last tracing - except for further evloving of ST-T abnormalities in Precordial leads  Echo: Study Conclusions - Left ventricle: The cavity size was normal. Wall thickness was normal. The estimated ejection fraction was 50%. Wall motion was normal; there were no regional wall motion abnormalities. Left ventricular diastolic function parameters were normal. - Left atrium: The atrium was mildly dilated.  Allergies  Allergen Reactions  . Aspirin Other (See Comments)    Tachycardia  . Sulfa  Drugs Cross Reactors Other (See Comments)    Unknown     FOLLOW UP PLANS AND APPOINTMENTS Discharge Orders    Future Orders Please Complete By Expires   Diet - low sodium heart healthy      Diet Carb Modified      Increase activity slowly      Call MD for:  redness, tenderness, or signs of infection (pain, swelling, redness, odor or green/yellow discharge around incision site)        Current Discharge Medication List    START taking these medications   Details  carvedilol  (COREG) 12.5 MG tablet Take 1 tablet (12.5 mg total) by mouth 2 (two) times daily with a meal. Qty: 60 tablet, Refills: 11    clopidogrel (PLAVIX) 75 MG tablet Take 1 tablet (75 mg total) by mouth daily with breakfast. Qty: 30 tablet, Refills: 11    lisinopril (PRINIVIL,ZESTRIL) 5 MG tablet Take 1 tablet (5 mg total) by mouth daily. Qty: 30 tablet, Refills: 11    metFORMIN (GLUCOPHAGE) 500 MG tablet Take 1 tablet (500 mg total) by mouth 2 (two) times daily with a meal. Qty: 60 tablet, Refills: 3    nicotine (NICODERM CQ - DOSED IN MG/24 HOURS) 21 mg/24hr patch Place 1 patch (21 mg total) onto the skin daily. Qty: 7 patch, Refills: 0 - see instructions    nitroGLYCERIN (NITROSTAT) 0.4 MG SL tablet Place 1 tablet (0.4 mg total) under the tongue every 5 (five) minutes as needed for chest pain. Qty: 25 tablet, Refills: 3    Chantix starter pack - take as directed.  Crestor 40mg  daily  Glucose meter - check blood sugar BID - Rx hand-written and given to patient    CONTINUE these medications which have NOT CHANGED   Details  OVER THE COUNTER MEDICATION Apply 1 application topically once a week. For excema on hands.      STOP taking these medications     ibuprofen (ADVIL,MOTRIN) 200 MG tablet                BRING ALL MEDICATIONS WITH YOU TO FOLLOW UP APPOINTMENTS  Time spent with patient to include physician time: 40 min Signed: Theodore Demark 10/11/2011, 10:23 AM Co-Sign MD

## 2011-10-11 NOTE — Telephone Encounter (Signed)
Pt's husband called stating pt was just d/c'd from [facility] by [place]. Rx was sent to [facility] in [place] for Crestor 40 mg. Pt's insurance will not cover this. He would like to know if it can be changed to Lipitor which insurance will cover. Pt's husband is aware that there is no MD in our office at present to ask. We will discuss with MD as soon as available. Pt's husband  verbalized understanding. He states in the mean time he is going to try to reach [place] by phone.

## 2011-10-11 NOTE — Progress Notes (Signed)
CARDIAC REHAB PHASE I   PRE:  Rate/Rhythm: 92SR  BP:  Supine:   Sitting: 104/78  Standing:    SaO2: 99%RA  MODE:  Ambulation: 550 ft   POST:  Rate/Rhythem: 109  BP:  Supine:   Sitting: 116/80  Standing:    SaO2: 99%RA 0840-0925 Pt walked without chest pain. Tolerated well. Gait steady. Pt newly diagnosed with diabetes. Encouraged outpt classes. Gave diabetic diet and discussed counting carbs.  Put diabetic video on as I left and encouraged pt to view videos before she leaves. Needs to see case mgr to see if any asst. With glucometer.  Duanne Limerick

## 2011-10-11 NOTE — Progress Notes (Signed)
Pharmacy - Lovenox for VTE prophylaxis  Assessment: Pt is on lovenox for VTE prophylaxis. She is currently on 0.5mg /kg/day due to obesity. Dose is appropriate and no signs of bleeding or other problems noted.   Plan: Pharmacy will sign off since pt is stable. Please re- consult [place] if anything else is needed. Thank you for the consult!  Lysle Pearl, PharmD, BCPS Pager # [phone] 10/11/2011 8:29 AM

## 2011-10-11 NOTE — Consult Note (Signed)
Pt smokes about just under 1 ppd and says she knows she has to quit and wants to get a Rx for chantix. Pt is in preparation stage. Discussed chantix use instructions, side effects etc. Referred to MD for Rx. Pt's husband also smokes but she is asking him to smoke outside. Referred to 1-800 quit now for f/u and support. Discussed oral fixation substitutes, second hand smoke and in home smoking policy. Reviewed and gave pt Written education/contact information.

## 2011-10-11 NOTE — Progress Notes (Signed)
Subjective:   [name] is a 31 year old white female with history of tobacco abuse and morbid obesity who was transferred from [facility] for an acute ST elevation anterior myocardial infarction on evening of 10/08/11. She has 2 mm of ST elevation anterior precordial leads.   Cath with high grade lesion in LAD and Ramus. LAD treated with BMS. Ramus with cutting balloon. EF 55%.  Improving. Feels much better! Walking halls with Cardiac Rehab. Cardiac enzymes washing out slowly. No further CP/SOB or nausea.  CBGs 129-177.    Intake/Output Summary (Last 24 hours) at 10/11/11 0742 Last data filed at 10/10/11 1700  Gross per 24 hour  Intake    780 ml  Output    550 ml  Net    230 ml    Current meds:    . carvedilol  12.5 mg Oral BID WC  . clopidogrel  75 mg Oral Q breakfast  . enoxaparin (LOVENOX) injection  90 mg Subcutaneous Q24H  . influenza  inactive virus vaccine  0.5 mL Intramuscular Tomorrow-1000  . insulin aspart  0-15 Units Subcutaneous TID WC  . lisinopril  5 mg Oral Daily  . metFORMIN  500 mg Oral BID WC  . nicotine  21 mg Transdermal Daily  . pneumococcal 23 valent vaccine  0.5 mL Intramuscular Tomorrow-1000  . rosuvastatin  40 mg Oral q1800  . sodium chloride  3 mL Intravenous Q12H  . spironolactone  12.5 mg Oral Daily  . DISCONTD: carvedilol  6.25 mg Oral BID WC   Infusions:     Objective:  Blood pressure 132/89, pulse 85, temperature 98.2 F (36.8 C), temperature source Oral, resp. rate 18, height 5\' 10"  (1.778 m), weight 183 kg (403 lb 7.1 oz), SpO2 97.00%. Weight change:   Physical Exam: General:  Obese.No resp difficulty HEENT: normal. + hirsuit Neck: supple. JVP unable to see . Carotids 2+ bilat; no bruits. No lymphadenopathy or thryomegaly appreciated. Cor: PMI nonpalpable Regular rate & rhythm. No rubs, gallops or murmurs. Lungs: clear Abdomen: obese soft, nontender, nondistended.  Extremities: no cyanosis, clubbing, rash, tr edema. r radial  site ok Neuro: alert & orientedx3, cranial nerves grossly intact. moves all 4 extremities w/o difficulty. Affect pleasant  Telemetry:  Sinus rhythm  Lab Results: Basic Metabolic Panel:  Lab 10/10/11 8413 10/09/11 0630  NA 136 137  K 3.6 3.7  CL 100 103  CO2 25 24  GLUCOSE 263* 170*  BUN 8 9  CREATININE 0.61 0.59  CALCIUM 9.1 8.8  MG -- --  PHOS -- --   Liver Function Tests: No results found for this basename: AST:5,ALT:5,ALKPHOS:5,BILITOT:5,PROT:5,ALBUMIN:5 in the last 168 hours No results found for this basename: LIPASE:5,AMYLASE:5 in the last 168 hours No results found for this basename: AMMONIA:5 in the last 168 hours CBC:  Lab 10/09/11 0630  WBC 11.9*  NEUTROABS --  HGB 13.0  HCT 36.8  MCV 87.0  PLT 207   Cardiac Enzymes:  Lab 10/09/11 1140 10/09/11 0600  CKTOTAL 702* 743*  CKMB 57.1* 62.4*  CKMBINDEX -- --  TROPONINI 11.02* 20.07*   BNP: No components found with this basename: POCBNP:5 CBG:  Lab 10/11/11 0534 10/10/11 2114 10/10/11 1715 10/10/11 1218 10/10/11 0749  GLUCAP 135* 177* 129* 151* 144*   Microbiology: No results found for this basename: cult   No results found for this basename: CULT:2,SDES:2 in the last 168 hours  Imaging: No results found.  ECG:  SR 83. Evolving anterolateral MI. No ST elevation.   Echo  shows EF 50%   ASSESSMENT:   1. Anterior STEMI 1/11    --s/p BMS to LAD and cutting balloon of ramus 2. Morbid obesity 3. Tobacco abuse 4. Probable OSA 5. DM2, new diagnosis 6. Dyslipidemia  PLAN/DISCUSSION:  Doing well. Will increase Coreg to 12.5 mg bid. Continue ACEi, ASA, plavix, statin. Pt. Interested in having home glucose monitor if Medicaid will pay for it. Also wants to try Chantix. States Nicotine patch isn't helping. Discussed necessary lifestyle modifications. OK for discharge today. Will need follow up in our [place] office. I also recommended she get established with primary care for management of DM.    LOS: 3  days    Peter [place], MD,FACC 10/11/2011, 7:42 AM

## 2011-10-12 ENCOUNTER — Telehealth: Payer: Self-pay | Admitting: *Deleted

## 2011-10-12 LAB — POCT I-STAT, CHEM 8
BUN: 8 mg/dL (ref 6–23)
Calcium, Ion: 1.19 mmol/L (ref 1.12–1.32)
Chloride: 105 mEq/L (ref 96–112)
Creatinine, Ser: 0.5 mg/dL (ref 0.50–1.10)
Glucose, Bld: 188 mg/dL — ABNORMAL HIGH (ref 70–99)
HCT: 41 % (ref 36.0–46.0)
Hemoglobin: 13.9 g/dL (ref 12.0–15.0)
Potassium: 3.4 mEq/L — ABNORMAL LOW (ref 3.5–5.1)
Sodium: 140 mEq/L (ref 135–145)
TCO2: 23 mmol/L (ref 0–100)

## 2011-10-12 NOTE — Telephone Encounter (Signed)
Pt notified. She states she does not have a primary MD at this time. She is working on getting someone with her Careers information officer. She was first diagnosed w/diabetes during this hospitalization.

## 2011-10-12 NOTE — Telephone Encounter (Signed)
[name] called the office to ask if she could take dressing off her wrist from cath. She states she cannot find any instructions in d/c notes regarding this. Per Dr. Kirke Corin, pt can remove dressing.

## 2011-10-12 NOTE — Telephone Encounter (Signed)
Message copied by Arlyss Gandy on Tue Oct 12, 2011  3:42 PM ------      Message from: [place], [place] T      Created: Mon Oct 11, 2011  5:26 PM       Not sure why this is coming to me.      Hospital lab.      She has a h/o diabetes.      Fax to PCP.      Tereso Newcomer, PA-C  5:26 PM 10/11/2011

## 2011-10-25 ENCOUNTER — Encounter: Payer: Self-pay | Admitting: Cardiovascular Disease

## 2011-10-25 ENCOUNTER — Ambulatory Visit (INDEPENDENT_AMBULATORY_CARE_PROVIDER_SITE_OTHER): Payer: Medicaid Other | Admitting: Cardiovascular Disease

## 2011-10-25 DIAGNOSIS — Z720 Tobacco use: Secondary | ICD-10-CM

## 2011-10-25 DIAGNOSIS — E785 Hyperlipidemia, unspecified: Secondary | ICD-10-CM

## 2011-10-25 DIAGNOSIS — Z79899 Other long term (current) drug therapy: Secondary | ICD-10-CM

## 2011-10-25 DIAGNOSIS — I2510 Atherosclerotic heart disease of native coronary artery without angina pectoris: Secondary | ICD-10-CM | POA: Insufficient documentation

## 2011-10-25 DIAGNOSIS — F17200 Nicotine dependence, unspecified, uncomplicated: Secondary | ICD-10-CM

## 2011-10-25 NOTE — Assessment & Plan Note (Signed)
I will request a fasting lipid and liver profile to be done in 2 weeks from now. Continue high-dose Lipitor.

## 2011-10-25 NOTE — Assessment & Plan Note (Signed)
The patient had a recent anterior myocardial infarction. She had a bare-metal stent placed to the mid LAD and cutting balloon angioplasty to the ostial ramus. I had a prolonged discussion with the patient explaining that it's unusual to have that degree of coronary artery disease at her age. However, she has multiple risk factors for coronary artery disease and very poor lifestyle. She is now trying to improve her diet and trying to quit smoking. She is allergic to aspirin which caused her to have tachycardia in the past. I recommend continuing Plavix for at least one year but possibly indefinitely given that she is not able to take aspirin. I recommend starting cardiac rehabilitation. Continue Coreg. She has been having dry cough which is likely a side effect of lisinopril which will be stopped today especially that her ejection fraction was normal.

## 2011-10-25 NOTE — Progress Notes (Signed)
HPI  This is a 31 year old female who is here today for a followup visit. She presented recently with chest pain and was found to have anterior ST elevation. She was transferred to [facility] where she underwent emergent cardiac catheterization. She was found to have a 95% mid LAD stenosis and 95% ostial ramus stenosis. She had an angioplasty in bare-metal stent placement to the mid LAD as well as cutting balloon angioplasty to ostial ramus. Her ejection fraction was 55%. She had no complications. The patient has multiple comorbidities. She was diagnosed with type 2 diabetes at the time of her myocardial infarction. She has prolonged history of morbid obesity and tobacco use. She also has hypertension and hyperlipidemia. She had very poor lifestyle. She is now trying to make changes including smoking cessation and healthier diet. Since her hospital discharge, she has not had any chest pain. She continues to complain of dyspnea. She has been having severe dry cough since her discharge.  Allergies  Allergen Reactions  . Aspirin Other (See Comments)    Tachycardia  . Sulfa Drugs Cross Reactors Other (See Comments)    Unknown     Current Outpatient Prescriptions on File Prior to Visit  Medication Sig Dispense Refill  . atorvastatin (LIPITOR) 80 MG tablet Take 1 tablet (80 mg total) by mouth daily.  30 tablet  4  . carvedilol (COREG) 12.5 MG tablet Take 1 tablet (12.5 mg total) by mouth 2 (two) times daily with a meal.  60 tablet  11  . clopidogrel (PLAVIX) 75 MG tablet Take 1 tablet (75 mg total) by mouth daily with breakfast.  30 tablet  11  . metFORMIN (GLUCOPHAGE) 500 MG tablet Take 1 tablet (500 mg total) by mouth 2 (two) times daily with a meal.  60 tablet  3  . nitroGLYCERIN (NITROSTAT) 0.4 MG SL tablet Place 1 tablet (0.4 mg total) under the tongue every 5 (five) minutes as needed for chest pain.  25 tablet  3  . OVER THE COUNTER MEDICATION Apply 1 application topically once a week.  For excema on hands.      . varenicline (CHANTIX) 0.5 MG tablet Take 1 tablet (0.5 mg total) by mouth daily. 1 tab per day on days 1-3, 1 tab BID days 4-7, 2 tabs BID after that.  90 tablet  0     Past Medical History  Diagnosis Date  . Morbid obesity   . Migraines   . Hypertension   . Hyperlipidemia   . Coronary artery disease 09/2011    Anterior MI. PCI and BMS to LAD and angioplsty to ostial Ramus     Past Surgical History  Procedure Date  . Cholecystectomy   . Cesarean section   . Cardiac catheterization 10/08/2011    LAD: 95% mid, Ramus: 95% ostial  . Coronary angioplasty with stent placement 10/08/2011    LAD: BMS, Ramus: cutting balloon angioplasty     Family History  Problem Relation Age of Onset  . Heart attack Father      History   Social History  . Marital Status: Married    Spouse Name: N/A    Number of Children: N/A  . Years of Education: N/A   Occupational History  . Not on file.   Social History Main Topics  . Smoking status: Current Everyday Smoker -- 0.5 packs/day for 20 years    Types: Cigarettes  . Smokeless tobacco: Never Used  . Alcohol Use: No  . Drug Use: No  .  Sexually Active: Not on file   Other Topics Concern  . Not on file   Social History Narrative  . No narrative on file     PHYSICAL EXAM   BP 116/75  Pulse 86  Ht 5\' 11"  (1.803 m)  Wt 387 lb (175.542 kg)  BMI 53.98 kg/m2  Constitutional: She is oriented to person, place, and time. She is morbidly obese and well-nourished. No distress.  HENT: No nasal discharge.  Head: Normocephalic and atraumatic.  Eyes: Pupils are equal, round, and reactive to light. Right eye exhibits no discharge. Left eye exhibits no discharge.  Neck: Normal range of motion. Neck supple. No JVD present. No thyromegaly present.  Cardiovascular: Normal rate, regular rhythm, normal heart sounds and intact distal pulses. Exam reveals no gallop and no friction rub.  No murmur heard.  Pulmonary/Chest:  Effort normal and breath sounds normal. No stridor. No respiratory distress. She has no wheezes. She has no rales. She exhibits no tenderness.  Abdominal: Soft. Bowel sounds are normal. She exhibits no distension. There is no tenderness. There is no rebound and no guarding.  Musculoskeletal: Normal range of motion. She exhibits no edema and no tenderness.  Neurological: She is alert and oriented to person, place, and time. Coordination normal.  Skin: Skin is warm and dry. No rash noted. She is not diaphoretic. No erythema. No pallor.  Psychiatric: She has a normal mood and affect. Her behavior is normal. Judgment and thought content normal.  Right radial pulse is normal. There is no hematoma or ecchymosis.    ASSESSMENT AND PLAN

## 2011-10-25 NOTE — Assessment & Plan Note (Signed)
The patient is trying to quit smoking and currently she is known to about 5 cigarettes a day. The importance of smoking cessation was discussed with her. She was given a prescription for Chantix when she left the hospital.

## 2011-10-25 NOTE — Patient Instructions (Signed)
Follow up as scheduled. Stop Lisinopril. Your physician recommends that you go to the [facility] for a FASTING lipid profile and liver function labs. Do not eat or drink after midnight.  DO IN 2 WEEKS.  If the results of your test are normal or stable, you will receive a letter. If they are abnormal, the nurse will contact you by phone.

## 2011-11-03 ENCOUNTER — Telehealth: Payer: Self-pay | Admitting: *Deleted

## 2011-11-03 NOTE — Telephone Encounter (Signed)
Awaiting response from MD.  

## 2011-11-05 MED ORDER — VARENICLINE TARTRATE 0.5 MG X 11 & 1 MG X 42 PO MISC: ORAL | Status: DC

## 2011-11-05 NOTE — Telephone Encounter (Signed)
Send her an Rx for Chantix (1 start pack + 2 continuation packs)

## 2011-11-09 ENCOUNTER — Encounter: Payer: Self-pay | Admitting: *Deleted

## 2011-12-17 ENCOUNTER — Ambulatory Visit: Payer: Medicaid Other | Admitting: Cardiovascular Disease

## 2012-01-04 ENCOUNTER — Encounter: Payer: Self-pay | Admitting: Cardiology

## 2012-01-04 ENCOUNTER — Ambulatory Visit (INDEPENDENT_AMBULATORY_CARE_PROVIDER_SITE_OTHER): Payer: Medicaid Other | Admitting: Cardiology

## 2012-01-04 VITALS — BP 105/73 | HR 84 | Ht 71.0 in | Wt 361.0 lb

## 2012-01-04 DIAGNOSIS — I2510 Atherosclerotic heart disease of native coronary artery without angina pectoris: Secondary | ICD-10-CM

## 2012-01-04 DIAGNOSIS — F17200 Nicotine dependence, unspecified, uncomplicated: Secondary | ICD-10-CM

## 2012-01-04 DIAGNOSIS — Z720 Tobacco use: Secondary | ICD-10-CM

## 2012-01-04 MED ORDER — NICOTINE 10 MG IN INHA: RESPIRATORY_TRACT | Status: DC

## 2012-01-04 NOTE — Assessment & Plan Note (Signed)
The patient understands the need to lose weight with diet and exercise. We have discussed specific strategies for this.  She has lost 40 pounds by her report.

## 2012-01-04 NOTE — Progress Notes (Signed)
   HPI The patient presents for follow up of CAD.  Since she was last seen she has had no new cardiovascular complaints.  Unfortunately she has not been able to complete cardiac rehabilitation because she has gotten viruses or illnesses every time she has attended.  She is not exercising on her own.  She started smoking again after taking a course of Chantix.  The patient denies any new symptoms such as chest discomfort, neck or arm discomfort. There has been no new shortness of breath, PND or orthopnea. There have been no reported palpitations, presyncope or syncope.   Allergies  Allergen Reactions  . Aspirin Other (See Comments)    Tachycardia  . Sulfa Drugs Cross Reactors Other (See Comments)    Unknown    Current Outpatient Prescriptions  Medication Sig Dispense Refill  . ALPRAZolam (XANAX) 0.25 MG tablet Take 0.25 mg by mouth 2 (two) times daily.      Marland Kitchen atorvastatin (LIPITOR) 80 MG tablet Take 1 tablet (80 mg total) by mouth daily.  30 tablet  4  . carvedilol (COREG) 12.5 MG tablet Take 1 tablet (12.5 mg total) by mouth 2 (two) times daily with a meal.  60 tablet  11  . clopidogrel (PLAVIX) 75 MG tablet Take 1 tablet (75 mg total) by mouth daily with breakfast.  30 tablet  11  . metFORMIN (GLUCOPHAGE) 500 MG tablet Take 1 tablet (500 mg total) by mouth 2 (two) times daily with a meal.  60 tablet  3  . nitroGLYCERIN (NITROSTAT) 0.4 MG SL tablet Place 1 tablet (0.4 mg total) under the tongue every 5 (five) minutes as needed for chest pain.  25 tablet  3  . OVER THE COUNTER MEDICATION Apply 1 application topically once a week. For excema on hands.        Past Medical History  Diagnosis Date  . Morbid obesity   . Migraines   . Hypertension   . Hyperlipidemia   . Coronary artery disease 09/2011    Anterior MI. PCI and BMS to LAD and angioplsty to ostial Ramus    Past Surgical History  Procedure Date  . Cholecystectomy   . Cesarean section   . Cardiac catheterization 10/08/2011   LAD: 95% mid, Ramus: 95% ostial  . Coronary angioplasty with stent placement 10/08/2011    LAD: BMS, Ramus: cutting balloon angioplasty    ROS:  As stated in the HPI and negative for all other systems.  PHYSICAL EXAM BP 105/73  Pulse 84  Ht 5\' 11"  (1.803 m)  Wt 361 lb (163.749 kg)  BMI 50.35 kg/m2 GENERAL:  Well appearing HEENT:  Pupils equal round and reactive, fundi not visualized, oral mucosa unremarkable NECK:  No jugular venous distention, waveform within normal limits, carotid upstroke brisk and symmetric, no bruits, no thyromegaly LYMPHATICS:  No cervical, inguinal adenopathy LUNGS:  Clear to auscultation bilaterally BACK:  No CVA tenderness CHEST:  Unremarkable HEART:  PMI not displaced or sustained,S1 and S2 within normal limits, no S3, no S4, no clicks, no rubs, no murmurs ABD:  Flat, positive bowel sounds normal in frequency in pitch, no bruits, no rebound, no guarding, no midline pulsatile mass, no hepatomegaly, no splenomegaly, obese EXT:  2 plus pulses throughout, no edema, no cyanosis no clubbing SKIN:  No rashes no nodules NEURO:  Cranial nerves II through XII grossly intact, motor grossly intact throughout PSYCH:  Cognitively intact, oriented to person place and time   ASSESSMENT AND PLAN

## 2012-01-04 NOTE — Assessment & Plan Note (Signed)
The patient has no new sypmtoms.  No further cardiovascular testing is indicated.  We will continue with aggressive risk reduction and meds as listed.  

## 2012-01-04 NOTE — Assessment & Plan Note (Signed)
Her LDL in February was 12 with an HDL of 26. She will continue her current medication with increased exercise to try to raise her HDL.

## 2012-01-04 NOTE — Patient Instructions (Addendum)
Follow up in 6 weeks. Your physician recommends that you continue on your current medications as directed. Please refer to the Current Medication list given to you today. Use Nicotrol inhaler. 6-16 cartridges/day for 6-12 weeks and then gradually decrease for the next 6-12 weeks.

## 2012-01-04 NOTE — Assessment & Plan Note (Signed)
Greater than 10 minutes counseling. She ultimately wants to try the nicotine inhaler. I gave her prescription for this.

## 2012-02-17 ENCOUNTER — Encounter: Payer: Self-pay | Admitting: Physician Assistant

## 2012-02-17 ENCOUNTER — Ambulatory Visit (INDEPENDENT_AMBULATORY_CARE_PROVIDER_SITE_OTHER): Payer: Medicaid Other | Admitting: Physician Assistant

## 2012-02-17 VITALS — BP 103/70 | HR 76 | Ht 71.0 in | Wt 356.0 lb

## 2012-02-17 DIAGNOSIS — I2510 Atherosclerotic heart disease of native coronary artery without angina pectoris: Secondary | ICD-10-CM

## 2012-02-17 DIAGNOSIS — Z720 Tobacco use: Secondary | ICD-10-CM

## 2012-02-17 DIAGNOSIS — R002 Palpitations: Secondary | ICD-10-CM

## 2012-02-17 DIAGNOSIS — F17200 Nicotine dependence, unspecified, uncomplicated: Secondary | ICD-10-CM

## 2012-02-17 HISTORY — DX: Palpitations: R00.2

## 2012-02-17 MED ORDER — ATORVASTATIN CALCIUM 80 MG PO TABS: 80.0000 mg | ORAL_TABLET | Freq: Every day | ORAL | Status: DC

## 2012-02-17 NOTE — Patient Instructions (Addendum)
Your physician recommends that you schedule a follow-up appointment in:6 months. You will receive a reminder letter in the mail in about 4 months reminding you to call and schedule your appointment. If you don't receive this letter, please contact our office.   Your physician recommends that you continue on your current medications as directed. Please refer to the Current Medication list given to you today.   Your physician has recommended that you wear an event monitor. Event monitors are medical devices that record the heart's electrical activity. Doctors most often [place] these monitors to diagnose arrhythmias. Arrhythmias are problems with the speed or rhythm of the heartbeat. The monitor is a small, portable device. You can wear one while you do your normal daily activities. This is usually used to diagnose what is causing palpitations/syncope (passing out). You will be contacted by [facility].  Your physician recommends that you return for lab work in TODAY AT [facility].

## 2012-02-17 NOTE — Assessment & Plan Note (Signed)
Patient was encouraged to continue on current course to stop smoking tobacco altogether. She feels that she can do this without the recent addition of nicotine inhaler, which she was unable to tolerate secondary to lip ulceration. She appears to be committed to do so.

## 2012-02-17 NOTE — Assessment & Plan Note (Signed)
Quiescent on current medication regimen. Continue Plavix indefinitely, given that patient is allergic to ASA.

## 2012-02-17 NOTE — Progress Notes (Signed)
HPI: Patient returns for early scheduled followup, per Dr. Jenene Slicker request.  When last seen, she prescribed a nicotine inhaler to assist her in smoking cessation. She is currently down from one half pack per day to 2 cigarettes a day. Unfortunately, she was not able to tolerate the inhaler after a few weeks, due to blistering of her lips. At this point, however, she feels that she can proceed "cold [place]", finally quitting altogether.  She has not had any recurrent CP, since her myocardial infarction in January. She has, however, developed new onset tachy palpitations, near daily, lasting a few minutes in duration, and associated with some mild nausea.  Allergies  Allergen Reactions  . Aspirin Other (See Comments)    Tachycardia  . Sulfa Drugs Cross Reactors Other (See Comments)    Unknown    Current Outpatient Prescriptions  Medication Sig Dispense Refill  . ALPRAZolam (XANAX) 0.25 MG tablet Take 0.25 mg by mouth 2 (two) times daily.      Marland Kitchen atorvastatin (LIPITOR) 80 MG tablet Take 1 tablet (80 mg total) by mouth daily.  30 tablet  6  . carvedilol (COREG) 12.5 MG tablet Take 1 tablet (12.5 mg total) by mouth 2 (two) times daily with a meal.  60 tablet  11  . clopidogrel (PLAVIX) 75 MG tablet Take 1 tablet (75 mg total) by mouth daily with breakfast.  30 tablet  11  . metFORMIN (GLUCOPHAGE) 500 MG tablet Take 1 tablet (500 mg total) by mouth 2 (two) times daily with a meal.  60 tablet  3  . nitroGLYCERIN (NITROSTAT) 0.4 MG SL tablet Place 1 tablet (0.4 mg total) under the tongue every 5 (five) minutes as needed for chest pain.  25 tablet  3  . OVER THE COUNTER MEDICATION Apply 1 application topically once a week. For excema on hands.      Marland Kitchen DISCONTD: atorvastatin (LIPITOR) 80 MG tablet Take 1 tablet (80 mg total) by mouth daily.  30 tablet  4    Past Medical History  Diagnosis Date  . Morbid obesity   . Migraines   . Hypertension   . Hyperlipidemia   . Coronary artery disease  09/2011    Anterior MI. PCI and BMS to LAD and angioplsty to ostial Ramus    Past Surgical History  Procedure Date  . Cholecystectomy   . Cesarean section   . Cardiac catheterization 10/08/2011    LAD: 95% mid, Ramus: 95% ostial  . Coronary angioplasty with stent placement 10/08/2011    LAD: BMS, Ramus: cutting balloon angioplasty    History   Social History  . Marital Status: Married    Spouse Name: N/A    Number of Children: N/A  . Years of Education: N/A   Occupational History  . Not on file.   Social History Main Topics  . Smoking status: Current Everyday Smoker -- 0.5 packs/day for 20 years    Types: Cigarettes  . Smokeless tobacco: Never Used  . Alcohol Use: No  . Drug Use: No  . Sexually Active: Not on file   Other Topics Concern  . Not on file   Social History Narrative  . No narrative on file    Family History  Problem Relation Age of Onset  . Heart attack Father     ROS: no nausea, vomiting; no fever, chills; no melena, hematochezia; no claudication  PHYSICAL EXAM: BP 103/70  Pulse 76  Ht 5\' 11"  (1.803 m)  Wt 356 lb (161.481 kg)  BMI 49.65 kg/m2 GENERAL: 31 year old female, morbidly obese; NAD HEENT: NCAT, PERRLA, EOMI; sclera clear; no xanthelasma NECK: palpable bilateral carotid pulses, no bruits; unable to assess JVD; no TM LUNGS: CTA bilaterally CARDIAC: RRR (S1, S2); no significant murmurs; no rubs or gallops ABDOMEN: Markedly protuberant  EXTREMETIES: Trace bilateral peripheral edema SKIN: warm/dry; no obvious rash/lesions MUSCULOSKELETAL: no joint deformity NEURO: no focal deficit; NL affect   EKG:    ASSESSMENT & PLAN:  Palpitations We'll order a 21 day event monitor to rule out dysrhythmia. Will check BMET and TSH level.  Coronary artery disease Quiescent on current medication regimen. Continue Plavix indefinitely, given that patient is allergic to ASA.  Tobacco abuse Patient was encouraged to continue on current course to  stop smoking tobacco altogether. She feels that she can do this without the recent addition of nicotine inhaler, which she was unable to tolerate secondary to lip ulceration. She appears to be committed to do so.     Gene Oley [place], PAC

## 2012-02-17 NOTE — Assessment & Plan Note (Signed)
We'll order a 21 day event monitor to rule out dysrhythmia. Will check BMET and TSH level.

## 2012-02-23 ENCOUNTER — Telehealth: Payer: Self-pay | Admitting: *Deleted

## 2012-02-23 NOTE — Telephone Encounter (Signed)
Patient's husband informed

## 2012-02-23 NOTE — Telephone Encounter (Signed)
Message copied by Eustace Moore on Wed Feb 23, 2012  3:04 PM ------      Message from: Rande Brunt      Created: Fri Feb 18, 2012  3:17 PM       NL labs

## 2012-02-29 DIAGNOSIS — R002 Palpitations: Secondary | ICD-10-CM

## 2012-03-01 ENCOUNTER — Inpatient Hospital Stay (HOSPITAL_COMMUNITY)
Admission: EM | Admit: 2012-03-01 | Discharge: 2012-03-03 | DRG: 287 | Disposition: A | Discharge status: Home / Self Care | Payer: Medicaid Other | Source: Ambulatory Visit | Attending: Cardiology | Admitting: Cardiology

## 2012-03-01 ENCOUNTER — Encounter (HOSPITAL_COMMUNITY): Payer: Self-pay | Admitting: Emergency Medicine

## 2012-03-01 ENCOUNTER — Emergency Department (HOSPITAL_COMMUNITY): Payer: Medicaid Other

## 2012-03-01 DIAGNOSIS — I10 Essential (primary) hypertension: Secondary | ICD-10-CM | POA: Diagnosis present

## 2012-03-01 DIAGNOSIS — R0789 Other chest pain: Secondary | ICD-10-CM

## 2012-03-01 DIAGNOSIS — Z9861 Coronary angioplasty status: Secondary | ICD-10-CM

## 2012-03-01 DIAGNOSIS — E1169 Type 2 diabetes mellitus with other specified complication: Secondary | ICD-10-CM | POA: Diagnosis present

## 2012-03-01 DIAGNOSIS — I252 Old myocardial infarction: Secondary | ICD-10-CM

## 2012-03-01 DIAGNOSIS — E119 Type 2 diabetes mellitus without complications: Secondary | ICD-10-CM | POA: Diagnosis present

## 2012-03-01 DIAGNOSIS — Z7902 Long term (current) use of antithrombotics/antiplatelets: Secondary | ICD-10-CM

## 2012-03-01 DIAGNOSIS — Z882 Allergy status to sulfonamides status: Secondary | ICD-10-CM

## 2012-03-01 DIAGNOSIS — Z7985 Type 2 diabetes mellitus without complications: Secondary | ICD-10-CM

## 2012-03-01 DIAGNOSIS — R072 Precordial pain: Principal | ICD-10-CM | POA: Diagnosis present

## 2012-03-01 DIAGNOSIS — Z6841 Body Mass Index (BMI) 40.0 and over, adult: Secondary | ICD-10-CM

## 2012-03-01 DIAGNOSIS — Z720 Tobacco use: Secondary | ICD-10-CM | POA: Diagnosis present

## 2012-03-01 DIAGNOSIS — F17200 Nicotine dependence, unspecified, uncomplicated: Secondary | ICD-10-CM | POA: Diagnosis present

## 2012-03-01 DIAGNOSIS — Z8249 Family history of ischemic heart disease and other diseases of the circulatory system: Secondary | ICD-10-CM

## 2012-03-01 DIAGNOSIS — E782 Mixed hyperlipidemia: Secondary | ICD-10-CM | POA: Diagnosis present

## 2012-03-01 DIAGNOSIS — R079 Chest pain, unspecified: Secondary | ICD-10-CM

## 2012-03-01 DIAGNOSIS — I2510 Atherosclerotic heart disease of native coronary artery without angina pectoris: Secondary | ICD-10-CM | POA: Diagnosis present

## 2012-03-01 DIAGNOSIS — R002 Palpitations: Secondary | ICD-10-CM | POA: Diagnosis present

## 2012-03-01 DIAGNOSIS — Z886 Allergy status to analgesic agent status: Secondary | ICD-10-CM

## 2012-03-01 DIAGNOSIS — E785 Hyperlipidemia, unspecified: Secondary | ICD-10-CM | POA: Diagnosis present

## 2012-03-01 HISTORY — DX: Anxiety disorder, unspecified: F41.9

## 2012-03-01 LAB — BASIC METABOLIC PANEL
BUN: 10 mg/dL (ref 6–23)
CO2: 23 mEq/L (ref 19–32)
Calcium: 9.9 mg/dL (ref 8.4–10.5)
Chloride: 103 mEq/L (ref 96–112)
Creatinine, Ser: 0.57 mg/dL (ref 0.50–1.10)
GFR calc Af Amer: >90 mL/min (ref >90)
GFR calc non Af Amer: >90 mL/min (ref >90)
Glucose, Bld: 93 mg/dL (ref 70–99)
Potassium: 3.8 mEq/L (ref 3.5–5.1)
Sodium: 136 mEq/L (ref 135–145)

## 2012-03-01 LAB — D-DIMER, QUANTITATIVE: D-Dimer, Quant: 0.5 ug/mL-FEU — ABNORMAL HIGH (ref 0.00–0.48)

## 2012-03-01 LAB — POCT I-STAT TROPONIN I: Troponin i, poc: 0 ng/mL (ref 0.00–0.08)

## 2012-03-01 LAB — MRSA PCR SCREENING: MRSA by PCR: NEGATIVE

## 2012-03-01 LAB — CARDIAC PANEL(CRET KIN+CKTOT+MB+TROPI)
CK, MB: 1.6 ng/mL (ref 0.3–4.0)
CK, MB: 1.6 ng/mL (ref 0.3–4.0)
Relative Index: INVALID (ref 0.0–2.5)
Relative Index: INVALID (ref 0.0–2.5)
Total CK: 81 U/L (ref 7–177)
Total CK: 71 U/L (ref 7–177)
Troponin I: <0.3 ng/mL (ref <0.30)
Troponin I: <0.3 ng/mL (ref <0.30)

## 2012-03-01 LAB — CBC
HCT: 39.6 % (ref 36.0–46.0)
Hemoglobin: 13.6 g/dL (ref 12.0–15.0)
MCH: 30.4 pg (ref 26.0–34.0)
MCHC: 34.3 g/dL (ref 30.0–36.0)
MCV: 88.6 fL (ref 78.0–100.0)
Platelets: 265 10*3/uL (ref 150–400)
RBC: 4.47 MIL/uL (ref 3.87–5.11)
RDW: 13.2 % (ref 11.5–15.5)
WBC: 10.3 10*3/uL (ref 4.0–10.5)

## 2012-03-01 LAB — GLUCOSE, CAPILLARY: Glucose-Capillary: 93 mg/dL (ref 70–99)

## 2012-03-01 MED ORDER — SODIUM CHLORIDE 0.9 % IJ SOLN: 3.0000 mL | INTRAMUSCULAR | Status: DC | PRN

## 2012-03-01 MED ORDER — NITROGLYCERIN IN D5W 200-5 MCG/ML-% IV SOLN
3.0000 ug/min | INTRAVENOUS | Status: DC
  Administered 2012-03-01: 5 ug/min via INTRAVENOUS
  Administered 2012-03-02: 10 ug/min via INTRAVENOUS

## 2012-03-01 MED ORDER — SODIUM CHLORIDE 0.9 % IV SOLN: 250.0000 mL | INTRAVENOUS | Status: DC | PRN

## 2012-03-01 MED ORDER — INSULIN ASPART 100 UNIT/ML ~~LOC~~ SOLN: 0.0000 [IU] | Freq: Three times a day (TID) | SUBCUTANEOUS | Status: DC

## 2012-03-01 MED ORDER — CLOPIDOGREL BISULFATE 75 MG PO TABS
75.0000 mg | ORAL_TABLET | Freq: Every day | ORAL | Status: DC
  Administered 2012-03-02 – 2012-03-03 (×2): 75 mg via ORAL
  Filled 2012-03-01 (×2): qty 1

## 2012-03-01 MED ORDER — CARVEDILOL 12.5 MG PO TABS
12.5000 mg | ORAL_TABLET | Freq: Two times a day (BID) | ORAL | Status: DC
  Administered 2012-03-02 – 2012-03-03 (×3): 12.5 mg via ORAL
  Filled 2012-03-01 (×6): qty 1

## 2012-03-01 MED ORDER — ACETAMINOPHEN 325 MG PO TABS: 650.0000 mg | ORAL_TABLET | ORAL | Status: DC | PRN

## 2012-03-01 MED ORDER — ONDANSETRON HCL 4 MG/2ML IJ SOLN: 4.0000 mg | Freq: Four times a day (QID) | INTRAMUSCULAR | Status: DC | PRN

## 2012-03-01 MED ORDER — SODIUM CHLORIDE 0.9 % IV SOLN
INTRAVENOUS | Status: DC
  Administered 2012-03-02: 1000 mL via INTRAVENOUS

## 2012-03-01 MED ORDER — ALPRAZOLAM 0.25 MG PO TABS
0.2500 mg | ORAL_TABLET | Freq: Two times a day (BID) | ORAL | Status: DC
  Administered 2012-03-01 – 2012-03-03 (×4): 0.25 mg via ORAL
  Filled 2012-03-01 (×4): qty 1

## 2012-03-01 MED ORDER — HEPARIN (PORCINE) IN NACL 100-0.45 UNIT/ML-% IJ SOLN
1900.0000 [IU]/h | INTRAMUSCULAR | Status: DC
  Administered 2012-03-01: 1550 [IU]/h via INTRAVENOUS
  Administered 2012-03-02: 1900 [IU]/h via INTRAVENOUS
  Filled 2012-03-01 (×3): qty 250

## 2012-03-01 MED ORDER — ATORVASTATIN CALCIUM 80 MG PO TABS
80.0000 mg | ORAL_TABLET | Freq: Every day | ORAL | Status: DC
  Administered 2012-03-02 – 2012-03-03 (×2): 80 mg via ORAL
  Filled 2012-03-01 (×4): qty 1

## 2012-03-01 MED ORDER — DIAZEPAM 5 MG PO TABS
5.0000 mg | ORAL_TABLET | ORAL | Status: AC
  Administered 2012-03-02: 5 mg via ORAL
  Filled 2012-03-01: qty 1

## 2012-03-01 MED ORDER — HEPARIN BOLUS VIA INFUSION
4000.0000 [IU] | Freq: Once | INTRAVENOUS | Status: AC
  Administered 2012-03-01: 4000 [IU] via INTRAVENOUS

## 2012-03-01 MED ORDER — NITROGLYCERIN IN D5W 200-5 MCG/ML-% IV SOLN
2.0000 ug/min | Freq: Once | INTRAVENOUS | Status: AC
  Administered 2012-03-01: 5 ug/min via INTRAVENOUS
  Filled 2012-03-01: qty 250

## 2012-03-01 MED ORDER — NITROGLYCERIN 0.4 MG SL SUBL: 0.4000 mg | SUBLINGUAL_TABLET | SUBLINGUAL | Status: DC | PRN

## 2012-03-01 MED ORDER — MORPHINE SULFATE 4 MG/ML IJ SOLN
4.0000 mg | Freq: Once | INTRAMUSCULAR | Status: AC
  Administered 2012-03-01: 4 mg via INTRAVENOUS
  Filled 2012-03-01: qty 1

## 2012-03-01 MED ORDER — HYDROCERIN EX CREA: TOPICAL_CREAM | CUTANEOUS | Status: DC | PRN

## 2012-03-01 MED ORDER — SODIUM CHLORIDE 0.9 % IJ SOLN
3.0000 mL | Freq: Two times a day (BID) | INTRAMUSCULAR | Status: DC
  Administered 2012-03-01 – 2012-03-02 (×3): 3 mL via INTRAVENOUS

## 2012-03-01 MED ORDER — EUCERIN EX CREA: 1.0000 "application " | TOPICAL_CREAM | CUTANEOUS | Status: DC | PRN

## 2012-03-01 MED ORDER — ZOLPIDEM TARTRATE 5 MG PO TABS: 10.0000 mg | ORAL_TABLET | Freq: Every evening | ORAL | Status: DC | PRN

## 2012-03-01 MED ORDER — ONDANSETRON HCL 4 MG/2ML IJ SOLN
4.0000 mg | Freq: Once | INTRAMUSCULAR | Status: AC
  Administered 2012-03-01: 4 mg via INTRAVENOUS
  Filled 2012-03-01: qty 2

## 2012-03-01 NOTE — ED Notes (Signed)
C/o pressure to L side of chest with sob, nausea, and diaphoresis x 30 min.  States she was driving when pain started.  Reports MI in January.  States symptoms feel the same.  Reports pain and then tingling to R arm.

## 2012-03-01 NOTE — ED Notes (Signed)
Dr. Dillard Essex at the bedside with patient. Patient placed back on cardiac monitor.

## 2012-03-01 NOTE — H&P (Signed)
[name] is an 31 y.o. female.   Chief Complaint: Chest pain Primary Cardiologist: Dr. Antoine Poche HPI: This morbidly obese WF is admitted from [facility] ER with chest pain. Today she was driving to [place] and began to feel very hot. She turned around and started to drive back to [facility] and noted severe precordial chest pressure "like a car sitting on my chest" associated with right arm pain. Noted nausea and diaphoresis and dyspnea. Took SL NTG while driving with no significant improvement in pain. She states pain is identical to her heart attack pain of January 2013. At that time she had acute ASMI and had bare metal stent to LAD and cutting balloon angioplast of the intermediate.  Since then has done well with no recurrent angina until today.  She has had recent palpitations and started wearing a 21 day event monitor yesterday.  Past Medical History  Diagnosis Date  . Morbid obesity   . Migraines   . Hypertension   . Hyperlipidemia   . Coronary artery disease 09/2011    Anterior MI. PCI and BMS to LAD and angioplsty to ostial Ramus    Past Surgical History  Procedure Date  . Cholecystectomy   . Cesarean section   . Cardiac catheterization 10/08/2011    LAD: 95% mid, Ramus: 95% ostial  . Coronary angioplasty with stent placement 10/08/2011    LAD: BMS, Ramus: cutting balloon angioplasty    Family History  Problem Relation Age of Onset  . Heart attack Father    Social History:  reports that she has been smoking Cigarettes.  She has a 10 pack-year smoking history. She has never used smokeless tobacco. She reports that she does not drink alcohol or use illicit drugs.  Allergies:  Allergies  Allergen Reactions  . Aspirin Other (See Comments)    Tachycardia  . Sulfa Drugs Cross Reactors Nausea And Vomiting     (Not in a hospital admission)  Results for orders placed during the hospital encounter of 03/01/12 (from the past 48 hour(s))  CBC     Status: Normal   Collection Time   03/01/12  3:53 PM      Component Value Range Comment   WBC 10.3  4.0 - 10.5 (K/uL)    RBC 4.47  3.87 - 5.11 (MIL/uL)    Hemoglobin 13.6  12.0 - 15.0 (g/dL)    HCT 16.1  09.6 - 04.5 (%)    MCV 88.6  78.0 - 100.0 (fL)    MCH 30.4  26.0 - 34.0 (pg)    MCHC 34.3  30.0 - 36.0 (g/dL)    RDW 40.9  81.1 - 91.4 (%)    Platelets 265  150 - 400 (K/uL)   BASIC METABOLIC PANEL     Status: Normal   Collection Time   03/01/12  3:53 PM      Component Value Range Comment   Sodium 136  135 - 145 (mEq/L)    Potassium 3.8  3.5 - 5.1 (mEq/L)    Chloride 103  96 - 112 (mEq/L)    CO2 23  19 - 32 (mEq/L)    Glucose, Bld 93  70 - 99 (mg/dL)    BUN 10  6 - 23 (mg/dL)    Creatinine, Ser 7.82  0.50 - 1.10 (mg/dL)    Calcium 9.9  8.4 - 10.5 (mg/dL)    GFR calc non Af Amer >90  >90 (mL/min)    GFR calc Af Amer >90  >90 (mL/min)  POCT I-STAT TROPONIN I     Status: Normal   Collection Time   03/01/12  4:15 PM      Component Value Range Comment   Troponin i, poc 0.00  0.00 - 0.08 (ng/mL)    Comment 3            D-DIMER, QUANTITATIVE     Status: Abnormal   Collection Time   03/01/12  4:41 PM      Component Value Range Comment   D-Dimer, Quant 0.50 (*) 0.00 - 0.48 (ug/mL-FEU)   CARDIAC PANEL(CRET KIN+CKTOT+MB+TROPI)     Status: Normal   Collection Time   03/01/12  4:41 PM      Component Value Range Comment   Total CK 81  7 - 177 (U/L)    CK, MB 1.6  0.3 - 4.0 (ng/mL)    Troponin I <0.30  <0.30 (ng/mL)    Relative Index RELATIVE INDEX IS INVALID  0.0 - 2.5     Dg Chest 2 View  03/01/2012  *RADIOLOGY REPORT*  Clinical Data: Chest pain.  Shortness of breath.  CHEST - 2 VIEW  Comparison: None.  Findings: Suboptimal inspiration due to body habitus which accounts for atelectasis at the bases, left greater than right, and accentuates the cardiac silhouette. Cardiac silhouette moderately enlarged.  Hilar and mediastinal contours otherwise unremarkable. Lungs otherwise clear.  No visible pleural  effusions.  Visualized bony thorax intact.  IMPRESSION: Suboptimal inspiration due to body habitus accounts for bibasilar atelectasis.  No acute cardiopulmonary disease otherwise.  Moderate cardiomegaly.  Original Report Authenticated By: Arnell Sieving, M.D.   ROS reveals history of diarrhea since gall bladder surgery in 2005.  Hx of migraine headaches which have almost entirely disappeared since her MI. Patient has history of incomplete emptying of her bladder with frequent bladder infections. Has not seen a urologist yet. Nonproductive cough which she relates to her smoking (has cut way back but not yet stopped smoking altogether).  Blood pressure 100/59, pulse 70, temperature 98.9 F (37.2 C), temperature source Oral, resp. rate 22, last menstrual period 02/11/2012, SpO2 100.00%. The patient appears to be in no distress.  Head and neck exam reveals that the pupils are equal and reactive.  The extraocular movements are full.  There is no scleral icterus.  Mouth and pharynx are benign.  No lymphadenopathy. Patient has excessive facial hair.   No carotid bruits.  The jugular venous pressure is normal.  Thyroid is not enlarged or tender.  Chest is clear to percussion and auscultation.  No rales or rhonchi.  Expansion of the chest is symmetrical.  Heart reveals no abnormal lift or heave.  First and second heart sounds are normal.  There is no murmur gallop rub or click.  The abdomen is obese and nontender.  Bowel sounds are normoactive.  There is no hepatosplenomegaly or mass.  There are no abdominal bruits.  Extremities reveal no phlebitis or edema.  Pedal pulses are good.  There is no cyanosis or clubbing.  Neurologic exam is normal strength and no lateralizing weakness.  No sensory deficits.  Integument reveals no rash  Chest xray shows cardiomegaly exaggerated by poor inspiration.  No CHF.  EKG shows NSR with old ASMI but no acute ST changes.  Initial troponin  normal.  Assessment/Plan 1. Chest pain suggestive of acute coronary syndrome. No evidence of acute MI at this time. Chest pain identical to prior MI pain. Doubt pulmonary embolus (DDimer 0.50) but will be on IV heparin  for ACS. 2. CAD with prior ASMI 10/08/11 treated with BMS for 95% stenosis of mid-LAD and cutting balloon angioplasty of intermediate by Dr. [place]. 3. Morbid obesity but has lost 50 lb since January.  4. Allergy to ASA. Is on long term plavix. 5. Diabetes mellitus. Will hold metformin for cath and will check A1C 6. Tobacco abuse, ongoing but making an effort to stop.  Plan: Cardiac cath in am.  Noninvasive strategy likely to be inconclusive because of her body habitus.  Cassell Clement 03/01/2012, 5:45 PM

## 2012-03-01 NOTE — ED Notes (Signed)
[name] Husband cell phone [phone]

## 2012-03-01 NOTE — ED Notes (Signed)
Attempted report to 2000. RN unavailable

## 2012-03-01 NOTE — ED Notes (Signed)
Attempted report to 2013 RN. RN unavailable at this time. Per [employer] RN give report at the bedside.

## 2012-03-01 NOTE — ED Provider Notes (Signed)
History     CSN: [id number]  Arrival date & time 03/01/12  1538   First MD Initiated Contact with Patient 03/01/12 1627      Chief Complaint  Patient presents with  . Chest Pain    (Consider location/radiation/quality/duration/timing/severity/associated sxs/prior treatment) HPI Comments: Sudden onset of substernal chest pressure radiating to the left associated with shortness of breath, nausea diaphoresis. History of two-vessel CAD status post catheterization in January. One stent with Plavix. Patient has aspirin allergy. She took 3 nitroglycerin without relief. No previous pain since her MI. Denies any abdominal pain, vomiting, back pain, fever chills.  The history is provided by the patient.    Past Medical History  Diagnosis Date  . Morbid obesity   . Migraines   . Hypertension   . Hyperlipidemia   . Coronary artery disease 09/2011    Anterior MI. PCI and BMS to LAD and angioplsty to ostial Ramus    Past Surgical History  Procedure Date  . Cholecystectomy   . Cesarean section   . Cardiac catheterization 10/08/2011    LAD: 95% mid, Ramus: 95% ostial  . Coronary angioplasty with stent placement 10/08/2011    LAD: BMS, Ramus: cutting balloon angioplasty    Family History  Problem Relation Age of Onset  . Heart attack Father     History  Substance Use Topics  . Smoking status: Current Everyday Smoker -- 0.5 packs/day for 20 years    Types: Cigarettes  . Smokeless tobacco: Never Used  . Alcohol Use: No    OB History    Grav Para Term Preterm Abortions TAB SAB Ect Mult Living                  Review of Systems  Constitutional: Positive for diaphoresis. Negative for fever, activity change and appetite change.  HENT: Negative for congestion and rhinorrhea.   Eyes: Negative for visual disturbance.  Respiratory: Positive for chest tightness and shortness of breath. Negative for cough.   Cardiovascular: Positive for chest pain.  Gastrointestinal: Positive for  nausea. Negative for vomiting and abdominal pain.  Genitourinary: Negative for dysuria and hematuria.  Musculoskeletal: Negative for back pain.  Neurological: Negative for dizziness and headaches.    Allergies  Aspirin and Sulfa drugs cross reactors  Home Medications   No current outpatient prescriptions on file.  BP 108/56  Pulse 64  Temp(Src) 97.4 F (36.3 C) (Oral)  Resp 19  Ht 5\' 11"  (1.803 m)  Wt 350 lb 1.5 oz (158.8 kg)  BMI 48.83 kg/m2  SpO2 99%  LMP 02/11/2012  Physical Exam  Constitutional: She is oriented to person, place, and time. She appears well-developed and well-nourished. She appears distressed.       Uncomfortable  HENT:  Head: Normocephalic and atraumatic.  Mouth/Throat: Oropharynx is clear and moist.  Eyes: Conjunctivae are normal. Pupils are equal, round, and reactive to light.  Neck: Normal range of motion. Neck supple.  Cardiovascular: Normal rate, regular rhythm and normal heart sounds.   No murmur heard. Pulmonary/Chest: Effort normal and breath sounds normal. No respiratory distress. She exhibits tenderness.       Chest pain somewhat reproducible  Abdominal: Soft. There is no tenderness. There is no rebound and no guarding.  Musculoskeletal: Normal range of motion. She exhibits no edema and no tenderness.  Neurological: She is alert and oriented to person, place, and time. No cranial nerve deficit.  Skin: Skin is warm.    ED Course  Procedures (including critical care  time)  Labs Reviewed  D-DIMER, QUANTITATIVE - Abnormal; Notable for the following:    D-Dimer, Quant 0.50 (*)    All other components within normal limits  HEPARIN LEVEL (UNFRACTIONATED) - Abnormal; Notable for the following:    Heparin Unfractionated 0.19 (*)    All other components within normal limits  COMPREHENSIVE METABOLIC PANEL - Abnormal; Notable for the following:    Potassium 3.3 (*)    Albumin 3.2 (*)    All other components within normal limits  APTT -  Abnormal; Notable for the following:    aPTT 62 (*)    All other components within normal limits  CBC - Abnormal; Notable for the following:    HCT 35.4 (*)    All other components within normal limits  LIPID PANEL - Abnormal; Notable for the following:    Triglycerides 161 (*)    HDL 26 (*)    All other components within normal limits  CBC  BASIC METABOLIC PANEL  POCT I-STAT TROPONIN I  CARDIAC PANEL(CRET KIN+CKTOT+MB+TROPI)  MRSA PCR SCREENING  TSH  CARDIAC PANEL(CRET KIN+CKTOT+MB+TROPI)  CARDIAC PANEL(CRET KIN+CKTOT+MB+TROPI)  CARDIAC PANEL(CRET KIN+CKTOT+MB+TROPI)  PROTIME-INR  GLUCOSE, CAPILLARY  PREGNANCY, URINE  GLUCOSE, CAPILLARY  GLUCOSE, CAPILLARY  HEMOGLOBIN A1C   Dg Chest 2 View  03/01/2012  *RADIOLOGY REPORT*  Clinical Data: Chest pain.  Shortness of breath.  CHEST - 2 VIEW  Comparison: None.  Findings: Suboptimal inspiration due to body habitus which accounts for atelectasis at the bases, left greater than right, and accentuates the cardiac silhouette. Cardiac silhouette moderately enlarged.  Hilar and mediastinal contours otherwise unremarkable. Lungs otherwise clear.  No visible pleural effusions.  Visualized bony thorax intact.  IMPRESSION: Suboptimal inspiration due to body habitus accounts for bibasilar atelectasis.  No acute cardiopulmonary disease otherwise.  Moderate cardiomegaly.  Original Report Authenticated By: Arnell Sieving, M.D.     1. Coronary artery disease       MDM  Substernal chest pressure with shortness of breath and nausea. Similar to MI pain in January. History of two-vessel disease with stents. Pain goes in the right arm. Associated with diaphoresis.  Patient states ASA allergy.  Given NTG.  EKG without acute ischemia.  Troponin negative.  IV nitro, heparin, d/w Dr. Patty Sermons who will admit for acute coronary syndrome.       Date: 03/01/2012  Rate: 86  Rhythm: normal sinus rhythm  QRS Axis: normal  Intervals: normal  ST/T  Wave abnormalities: nonspecific ST/T changes  Conduction Disutrbances:none  Narrative Interpretation: T waves now upright  Old EKG Reviewed: unchanged    Glynn Octave, MD 03/02/12 1313

## 2012-03-01 NOTE — ED Notes (Signed)
Attempted report to RN at 2013. Per RN this is not stepdown unit and they cannot take patients with active chest pain. [facility] paged and notified of same who will change patient to stepdown unit bed assignment.

## 2012-03-01 NOTE — ED Notes (Signed)
Pt reports she was driving out of town to [place] this afternoon when she developed substernal chest pain described as "heaviness", rated 10/10 with radiation to her right arm. Pt reports she took 3 nitro while driving with change of pain from 10/10 to 8/10. Pt with recent MI in Jan and 1 stent placed at that time. Pt placed in gown on cardiac monitor and aware of plan.

## 2012-03-01 NOTE — Progress Notes (Signed)
ANTICOAGULATION CONSULT NOTE - Initial Consult  Pharmacy Consult for Heparin Indication: chest pain/ACS  Allergies  Allergen Reactions  . Aspirin Other (See Comments)    Tachycardia  . Sulfa Drugs Cross Reactors Nausea And Vomiting    Patient Measurements: Height: 5\' 11"  (180.3 cm) Weight: 356 lb (161.481 kg) IBW/kg (Calculated) : 70.8  Heparin Dosing Weight: 110 kg  Vital Signs: Temp: 98.9 F (37.2 C) (06/05 1546) Temp src: Oral (06/05 1546) BP: 100/59 mmHg (06/05 1730) Pulse Rate: 70  (06/05 1730)  Labs:  Basename 03/01/12 1641 03/01/12 1553  HGB -- 13.6  HCT -- 39.6  PLT -- 265  APTT -- --  LABPROT -- --  INR -- --  HEPARINUNFRC -- --  CREATININE -- 0.57  CKTOTAL 81 --  CKMB 1.6 --  TROPONINI <0.30 --    Estimated Creatinine Clearance: 170.7 ml/min (by C-G formula based on Cr of 0.57).   Medical History: Past Medical History  Diagnosis Date  . Morbid obesity   . Migraines   . Hypertension   . Hyperlipidemia   . Coronary artery disease 09/2011    Anterior MI. PCI and BMS to LAD and angioplsty to ostial Ramus    Medications:  Home Meds - Xanax, Lipitor, Coreg, Plavix, Metformin, NTG SL, Eucerin  Assessment: 31 y.o. female presents with chest pain. S/p MI Jan 2013 with BMS to LAD and balloon angioplasty to intermediate. To begin heparin. Plan for cath tomorrow.  Goal of Therapy:  Heparin level 0.3-0.7 units/ml Monitor platelets by anticoagulation protocol: Yes   Plan:  1. Heparin 4000 units IV now 2. Heparin gtt at 1550 units/hr 3. Heparin level 6 hours past gtt start 4. Daily Heparin level and CBC   Christoper Fabian, PharmD, BCPS Clinical pharmacist, pager [phone] 03/01/2012,6:12 PM

## 2012-03-02 ENCOUNTER — Encounter (HOSPITAL_COMMUNITY)
Admission: EM | Disposition: A | Discharge status: Home / Self Care | Payer: Self-pay | Source: Ambulatory Visit | Attending: Cardiology

## 2012-03-02 ENCOUNTER — Encounter (HOSPITAL_COMMUNITY): Payer: Self-pay | Admitting: Dietician

## 2012-03-02 DIAGNOSIS — I2510 Atherosclerotic heart disease of native coronary artery without angina pectoris: Secondary | ICD-10-CM

## 2012-03-02 HISTORY — PX: LEFT HEART CATHETERIZATION WITH CORONARY ANGIOGRAM: SHX5451

## 2012-03-02 LAB — COMPREHENSIVE METABOLIC PANEL
ALT: 19 U/L (ref 0–35)
AST: 15 U/L (ref 0–37)
Albumin: 3.2 g/dL — ABNORMAL LOW (ref 3.5–5.2)
Alkaline Phosphatase: 91 U/L (ref 39–117)
BUN: 12 mg/dL (ref 6–23)
CO2: 25 mEq/L (ref 19–32)
Calcium: 9.1 mg/dL (ref 8.4–10.5)
Chloride: 101 mEq/L (ref 96–112)
Creatinine, Ser: 0.75 mg/dL (ref 0.50–1.10)
GFR calc Af Amer: >90 mL/min (ref >90)
GFR calc non Af Amer: >90 mL/min (ref >90)
Glucose, Bld: 90 mg/dL (ref 70–99)
Potassium: 3.3 mEq/L — ABNORMAL LOW (ref 3.5–5.1)
Sodium: 136 mEq/L (ref 135–145)
Total Bilirubin: 0.3 mg/dL (ref 0.3–1.2)
Total Protein: 6.7 g/dL (ref 6.0–8.3)

## 2012-03-02 LAB — LIPID PANEL
Cholesterol: 114 mg/dL (ref 0–200)
HDL: 26 mg/dL — ABNORMAL LOW (ref >39)
LDL Cholesterol: 56 mg/dL (ref 0–99)
Total CHOL/HDL Ratio: 4.4 RATIO
Triglycerides: 161 mg/dL — ABNORMAL HIGH (ref <150)
VLDL: 32 mg/dL (ref 0–40)

## 2012-03-02 LAB — PROTIME-INR
INR: 1.13 (ref 0.00–1.49)
Prothrombin Time: 14.7 seconds (ref 11.6–15.2)

## 2012-03-02 LAB — CBC
HCT: 35.4 % — ABNORMAL LOW (ref 36.0–46.0)
Hemoglobin: 12.1 g/dL (ref 12.0–15.0)
MCH: 30.6 pg (ref 26.0–34.0)
MCHC: 34.2 g/dL (ref 30.0–36.0)
MCV: 89.6 fL (ref 78.0–100.0)
Platelets: 211 10*3/uL (ref 150–400)
RBC: 3.95 MIL/uL (ref 3.87–5.11)
RDW: 13.3 % (ref 11.5–15.5)
WBC: 9.8 10*3/uL (ref 4.0–10.5)

## 2012-03-02 LAB — APTT: aPTT: 62 seconds — ABNORMAL HIGH (ref 24–37)

## 2012-03-02 LAB — CARDIAC PANEL(CRET KIN+CKTOT+MB+TROPI)
CK, MB: 1.6 ng/mL (ref 0.3–4.0)
CK, MB: 1.6 ng/mL (ref 0.3–4.0)
Relative Index: INVALID (ref 0.0–2.5)
Relative Index: INVALID (ref 0.0–2.5)
Total CK: 61 U/L (ref 7–177)
Total CK: 69 U/L (ref 7–177)
Troponin I: <0.3 ng/mL (ref <0.30)
Troponin I: <0.3 ng/mL (ref <0.30)

## 2012-03-02 LAB — GLUCOSE, CAPILLARY
Glucose-Capillary: 97 mg/dL (ref 70–99)
Glucose-Capillary: 85 mg/dL (ref 70–99)
Glucose-Capillary: 101 mg/dL — ABNORMAL HIGH (ref 70–99)
Glucose-Capillary: 104 mg/dL — ABNORMAL HIGH (ref 70–99)
Glucose-Capillary: 71 mg/dL (ref 70–99)

## 2012-03-02 LAB — PREGNANCY, URINE: Preg Test, Ur: NEGATIVE

## 2012-03-02 LAB — TSH: TSH: 0.973 u[IU]/mL (ref 0.350–4.500)

## 2012-03-02 LAB — HEPARIN LEVEL (UNFRACTIONATED): Heparin Unfractionated: 0.19 IU/mL — ABNORMAL LOW (ref 0.30–0.70)

## 2012-03-02 LAB — HEMOGLOBIN A1C
Hgb A1c MFr Bld: 5.3 % (ref <5.7)
Mean Plasma Glucose: 105 mg/dL (ref <117)

## 2012-03-02 SURGERY — LEFT HEART CATHETERIZATION WITH CORONARY ANGIOGRAM

## 2012-03-02 MED ORDER — NITROGLYCERIN 0.2 MG/ML ON CALL CATH LAB
INTRAVENOUS | Status: AC
  Filled 2012-03-02: qty 1

## 2012-03-02 MED ORDER — HEPARIN BOLUS VIA INFUSION
2500.0000 [IU] | Freq: Once | INTRAVENOUS | Status: AC
  Administered 2012-03-02: 2500 [IU] via INTRAVENOUS
  Filled 2012-03-02: qty 2500

## 2012-03-02 MED ORDER — POTASSIUM CHLORIDE CRYS ER 20 MEQ PO TBCR
60.0000 meq | EXTENDED_RELEASE_TABLET | Freq: Once | ORAL | Status: AC
  Administered 2012-03-02: 60 meq via ORAL
  Filled 2012-03-02: qty 3

## 2012-03-02 MED ORDER — HEPARIN (PORCINE) IN NACL 2-0.9 UNIT/ML-% IJ SOLN
INTRAMUSCULAR | Status: AC
  Filled 2012-03-02: qty 2000

## 2012-03-02 MED ORDER — LIDOCAINE HCL (PF) 1 % IJ SOLN
INTRAMUSCULAR | Status: AC
  Filled 2012-03-02: qty 30

## 2012-03-02 MED ORDER — SODIUM CHLORIDE 0.9 % IV SOLN
INTRAVENOUS | Status: AC
  Administered 2012-03-02: 12:00:00 via INTRAVENOUS

## 2012-03-02 MED ORDER — HEPARIN SODIUM (PORCINE) 1000 UNIT/ML IJ SOLN
INTRAMUSCULAR | Status: AC
  Filled 2012-03-02: qty 1

## 2012-03-02 NOTE — H&P (View-Only) (Signed)
SUBJECTIVE:  Chest tightness throughout the night.  No SOB   PHYSICAL EXAM Filed Vitals:   03/02/12 0300 03/02/12 0400 03/02/12 0500 03/02/12 0600  BP: 94/49 91/52  91/47  Pulse: 70 73  61  Temp:  97.3 F (36.3 C)    TempSrc:  Oral    Resp: 17 19  17   Height:      Weight:   350 lb 1.5 oz (158.8 kg)   SpO2: 99% 97%  97%   General:  No distress Lungs:  Clear Heart:  RRR Abdomen:  Positive bowel sounds, no rebound no guarding Extremities:  No edema.  LABS: Lab Results  Component Value Date   CKTOTAL 61 03/02/2012   CKMB 1.6 03/02/2012   TROPONINI <0.30 03/02/2012   Results for orders placed during the hospital encounter of 03/01/12 (from the past 24 hour(s))  CBC     Status: Normal   Collection Time   03/01/12  3:53 PM      Component Value Range   WBC 10.3  4.0 - 10.5 (K/uL)   RBC 4.47  3.87 - 5.11 (MIL/uL)   Hemoglobin 13.6  12.0 - 15.0 (g/dL)   HCT 16.1  09.6 - 04.5 (%)   MCV 88.6  78.0 - 100.0 (fL)   MCH 30.4  26.0 - 34.0 (pg)   MCHC 34.3  30.0 - 36.0 (g/dL)   RDW 40.9  81.1 - 91.4 (%)   Platelets 265  150 - 400 (K/uL)  BASIC METABOLIC PANEL     Status: Normal   Collection Time   03/01/12  3:53 PM      Component Value Range   Sodium 136  135 - 145 (mEq/L)   Potassium 3.8  3.5 - 5.1 (mEq/L)   Chloride 103  96 - 112 (mEq/L)   CO2 23  19 - 32 (mEq/L)   Glucose, Bld 93  70 - 99 (mg/dL)   BUN 10  6 - 23 (mg/dL)   Creatinine, Ser 7.82  0.50 - 1.10 (mg/dL)   Calcium 9.9  8.4 - 95.6 (mg/dL)   GFR calc non Af Amer >90  >90 (mL/min)   GFR calc Af Amer >90  >90 (mL/min)  POCT I-STAT TROPONIN I     Status: Normal   Collection Time   03/01/12  4:15 PM      Component Value Range   Troponin i, poc 0.00  0.00 - 0.08 (ng/mL)   Comment 3           D-DIMER, QUANTITATIVE     Status: Abnormal   Collection Time   03/01/12  4:41 PM      Component Value Range   D-Dimer, Quant 0.50 (*) 0.00 - 0.48 (ug/mL-FEU)  CARDIAC PANEL(CRET KIN+CKTOT+MB+TROPI)     Status: Normal   Collection  Time   03/01/12  4:41 PM      Component Value Range   Total CK 81  7 - 177 (U/L)   CK, MB 1.6  0.3 - 4.0 (ng/mL)   Troponin I <0.30  <0.30 (ng/mL)   Relative Index RELATIVE INDEX IS INVALID  0.0 - 2.5   MRSA PCR SCREENING     Status: Normal   Collection Time   03/01/12  8:52 PM      Component Value Range   MRSA by PCR NEGATIVE  NEGATIVE   CARDIAC PANEL(CRET KIN+CKTOT+MB+TROPI)     Status: Normal   Collection Time   03/01/12  9:11 PM  Component Value Range   Total CK 71  7 - 177 (U/L)   CK, MB 1.6  0.3 - 4.0 (ng/mL)   Troponin I <0.30  <0.30 (ng/mL)   Relative Index RELATIVE INDEX IS INVALID  0.0 - 2.5   GLUCOSE, CAPILLARY     Status: Normal   Collection Time   03/01/12  9:34 PM      Component Value Range   Glucose-Capillary 93  70 - 99 (mg/dL)  HEPARIN LEVEL (UNFRACTIONATED)     Status: Abnormal   Collection Time   03/02/12  2:23 AM      Component Value Range   Heparin Unfractionated 0.19 (*) 0.30 - 0.70 (IU/mL)  COMPREHENSIVE METABOLIC PANEL     Status: Abnormal   Collection Time   03/02/12  2:23 AM      Component Value Range   Sodium 136  135 - 145 (mEq/L)   Potassium 3.3 (*) 3.5 - 5.1 (mEq/L)   Chloride 101  96 - 112 (mEq/L)   CO2 25  19 - 32 (mEq/L)   Glucose, Bld 90  70 - 99 (mg/dL)   BUN 12  6 - 23 (mg/dL)   Creatinine, Ser 4.54  0.50 - 1.10 (mg/dL)   Calcium 9.1  8.4 - 09.8 (mg/dL)   Total Protein 6.7  6.0 - 8.3 (g/dL)   Albumin 3.2 (*) 3.5 - 5.2 (g/dL)   AST 15  0 - 37 (U/L)   ALT 19  0 - 35 (U/L)   Alkaline Phosphatase 91  39 - 117 (U/L)   Total Bilirubin 0.3  0.3 - 1.2 (mg/dL)   GFR calc non Af Amer >90  >90 (mL/min)   GFR calc Af Amer >90  >90 (mL/min)  CARDIAC PANEL(CRET KIN+CKTOT+MB+TROPI)     Status: Normal   Collection Time   03/02/12  2:23 AM      Component Value Range   Total CK 61  7 - 177 (U/L)   CK, MB 1.6  0.3 - 4.0 (ng/mL)   Troponin I <0.30  <0.30 (ng/mL)   Relative Index RELATIVE INDEX IS INVALID  0.0 - 2.5   PROTIME-INR     Status: Normal    Collection Time   03/02/12  2:23 AM      Component Value Range   Prothrombin Time 14.7  11.6 - 15.2 (seconds)   INR 1.13  0.00 - 1.49   APTT     Status: Abnormal   Collection Time   03/02/12  2:23 AM      Component Value Range   aPTT 62 (*) 24 - 37 (seconds)  CBC     Status: Abnormal   Collection Time   03/02/12  2:23 AM      Component Value Range   WBC 9.8  4.0 - 10.5 (K/uL)   RBC 3.95  3.87 - 5.11 (MIL/uL)   Hemoglobin 12.1  12.0 - 15.0 (g/dL)   HCT 11.9 (*) 14.7 - 46.0 (%)   MCV 89.6  78.0 - 100.0 (fL)   MCH 30.6  26.0 - 34.0 (pg)   MCHC 34.2  30.0 - 36.0 (g/dL)   RDW 82.9  56.2 - 13.0 (%)   Platelets 211  150 - 400 (K/uL)  LIPID PANEL     Status: Abnormal   Collection Time   03/02/12  2:23 AM      Component Value Range   Cholesterol 114  0 - 200 (mg/dL)   Triglycerides 865 (*) <150 (mg/dL)  HDL 26 (*) >39 (mg/dL)   Total CHOL/HDL Ratio 4.4     VLDL 32  0 - 40 (mg/dL)   LDL Cholesterol 56  0 - 99 (mg/dL)    Intake/Output Summary (Last 24 hours) at 03/02/12 0725 Last data filed at 03/02/12 0600  Gross per 24 hour  Intake   1053 ml  Output      0 ml  Net   1053 ml    EKG:  NSR, rate 69, poor anterior R wave progression, no acute ST T wave changes.  03/02/2012  ASSESSMENT AND PLAN:  1)  Chest pain:  Ruled out. Cath today.  2)  Palpitations:  No sustained dysrhythmias on telemetry.  She will have her event recorder reviewed after it has been turned into our office  3)  Obesity:  Continue to educate.     4)  HTN:  BP is actually running low.  Continue current therapy.  5)  Hyperlipidemia:  LDL is 56.  Continue current therapy.  Rollene Rotunda 03/02/2012 7:25 AM

## 2012-03-02 NOTE — Progress Notes (Signed)
TR BAND REMOVAL  LOCATION:  right radial  DEFLATED PER PROTOCOL:  yes  TIME BAND OFF / DRESSING APPLIED:   1345   SITE UPON ARRIVAL:   Level 0  SITE AFTER BAND REMOVAL:  Level 0  REVERSE ALLEN'S TEST:    positive  CIRCULATION SENSATION AND MOVEMENT:  Within Normal Limits  yes  COMMENTS:    

## 2012-03-02 NOTE — Progress Notes (Signed)
 SUBJECTIVE:  Chest tightness throughout the night.  No SOB   PHYSICAL EXAM Filed Vitals:   03/02/12 0300 03/02/12 0400 03/02/12 0500 03/02/12 0600  BP: 94/49 91/52  91/47  Pulse: 70 73  61  Temp:  97.3 F (36.3 C)    TempSrc:  Oral    Resp: 17 19  17  Height:      Weight:   350 lb 1.5 oz (158.8 kg)   SpO2: 99% 97%  97%   General:  No distress Lungs:  Clear Heart:  RRR Abdomen:  Positive bowel sounds, no rebound no guarding Extremities:  No edema.  LABS: Lab Results  Component Value Date   CKTOTAL 61 03/02/2012   CKMB 1.6 03/02/2012   TROPONINI <0.30 03/02/2012   Results for orders placed during the hospital encounter of 03/01/12 (from the past 24 hour(s))  CBC     Status: Normal   Collection Time   03/01/12  3:53 PM      Component Value Range   WBC 10.3  4.0 - 10.5 (K/uL)   RBC 4.47  3.87 - 5.11 (MIL/uL)   Hemoglobin 13.6  12.0 - 15.0 (g/dL)   HCT 39.6  36.0 - 46.0 (%)   MCV 88.6  78.0 - 100.0 (fL)   MCH 30.4  26.0 - 34.0 (pg)   MCHC 34.3  30.0 - 36.0 (g/dL)   RDW 13.2  11.5 - 15.5 (%)   Platelets 265  150 - 400 (K/uL)  BASIC METABOLIC PANEL     Status: Normal   Collection Time   03/01/12  3:53 PM      Component Value Range   Sodium 136  135 - 145 (mEq/L)   Potassium 3.8  3.5 - 5.1 (mEq/L)   Chloride 103  96 - 112 (mEq/L)   CO2 23  19 - 32 (mEq/L)   Glucose, Bld 93  70 - 99 (mg/dL)   BUN 10  6 - 23 (mg/dL)   Creatinine, Ser 0.57  0.50 - 1.10 (mg/dL)   Calcium 9.9  8.4 - 10.5 (mg/dL)   GFR calc non Af Amer >90  >90 (mL/min)   GFR calc Af Amer >90  >90 (mL/min)  POCT I-STAT TROPONIN I     Status: Normal   Collection Time   03/01/12  4:15 PM      Component Value Range   Troponin i, poc 0.00  0.00 - 0.08 (ng/mL)   Comment 3           D-DIMER, QUANTITATIVE     Status: Abnormal   Collection Time   03/01/12  4:41 PM      Component Value Range   D-Dimer, Quant 0.50 (*) 0.00 - 0.48 (ug/mL-FEU)  CARDIAC PANEL(CRET KIN+CKTOT+MB+TROPI)     Status: Normal   Collection  Time   03/01/12  4:41 PM      Component Value Range   Total CK 81  7 - 177 (U/L)   CK, MB 1.6  0.3 - 4.0 (ng/mL)   Troponin I <0.30  <0.30 (ng/mL)   Relative Index RELATIVE INDEX IS INVALID  0.0 - 2.5   MRSA PCR SCREENING     Status: Normal   Collection Time   03/01/12  8:52 PM      Component Value Range   MRSA by PCR NEGATIVE  NEGATIVE   CARDIAC PANEL(CRET KIN+CKTOT+MB+TROPI)     Status: Normal   Collection Time   03/01/12  9:11 PM        Component Value Range   Total CK 71  7 - 177 (U/L)   CK, MB 1.6  0.3 - 4.0 (ng/mL)   Troponin I <0.30  <0.30 (ng/mL)   Relative Index RELATIVE INDEX IS INVALID  0.0 - 2.5   GLUCOSE, CAPILLARY     Status: Normal   Collection Time   03/01/12  9:34 PM      Component Value Range   Glucose-Capillary 93  70 - 99 (mg/dL)  HEPARIN LEVEL (UNFRACTIONATED)     Status: Abnormal   Collection Time   03/02/12  2:23 AM      Component Value Range   Heparin Unfractionated 0.19 (*) 0.30 - 0.70 (IU/mL)  COMPREHENSIVE METABOLIC PANEL     Status: Abnormal   Collection Time   03/02/12  2:23 AM      Component Value Range   Sodium 136  135 - 145 (mEq/L)   Potassium 3.3 (*) 3.5 - 5.1 (mEq/L)   Chloride 101  96 - 112 (mEq/L)   CO2 25  19 - 32 (mEq/L)   Glucose, Bld 90  70 - 99 (mg/dL)   BUN 12  6 - 23 (mg/dL)   Creatinine, Ser 0.75  0.50 - 1.10 (mg/dL)   Calcium 9.1  8.4 - 10.5 (mg/dL)   Total Protein 6.7  6.0 - 8.3 (g/dL)   Albumin 3.2 (*) 3.5 - 5.2 (g/dL)   AST 15  0 - 37 (U/L)   ALT 19  0 - 35 (U/L)   Alkaline Phosphatase 91  39 - 117 (U/L)   Total Bilirubin 0.3  0.3 - 1.2 (mg/dL)   GFR calc non Af Amer >90  >90 (mL/min)   GFR calc Af Amer >90  >90 (mL/min)  CARDIAC PANEL(CRET KIN+CKTOT+MB+TROPI)     Status: Normal   Collection Time   03/02/12  2:23 AM      Component Value Range   Total CK 61  7 - 177 (U/L)   CK, MB 1.6  0.3 - 4.0 (ng/mL)   Troponin I <0.30  <0.30 (ng/mL)   Relative Index RELATIVE INDEX IS INVALID  0.0 - 2.5   PROTIME-INR     Status: Normal    Collection Time   03/02/12  2:23 AM      Component Value Range   Prothrombin Time 14.7  11.6 - 15.2 (seconds)   INR 1.13  0.00 - 1.49   APTT     Status: Abnormal   Collection Time   03/02/12  2:23 AM      Component Value Range   aPTT 62 (*) 24 - 37 (seconds)  CBC     Status: Abnormal   Collection Time   03/02/12  2:23 AM      Component Value Range   WBC 9.8  4.0 - 10.5 (K/uL)   RBC 3.95  3.87 - 5.11 (MIL/uL)   Hemoglobin 12.1  12.0 - 15.0 (g/dL)   HCT 35.4 (*) 36.0 - 46.0 (%)   MCV 89.6  78.0 - 100.0 (fL)   MCH 30.6  26.0 - 34.0 (pg)   MCHC 34.2  30.0 - 36.0 (g/dL)   RDW 13.3  11.5 - 15.5 (%)   Platelets 211  150 - 400 (K/uL)  LIPID PANEL     Status: Abnormal   Collection Time   03/02/12  2:23 AM      Component Value Range   Cholesterol 114  0 - 200 (mg/dL)   Triglycerides 161 (*) <150 (mg/dL)     HDL 26 (*) >39 (mg/dL)   Total CHOL/HDL Ratio 4.4     VLDL 32  0 - 40 (mg/dL)   LDL Cholesterol 56  0 - 99 (mg/dL)    Intake/Output Summary (Last 24 hours) at 03/02/12 0725 Last data filed at 03/02/12 0600  Gross per 24 hour  Intake   1053 ml  Output      0 ml  Net   1053 ml    EKG:  NSR, rate 69, poor anterior R wave progression, no acute ST T wave changes.  03/02/2012  ASSESSMENT AND PLAN:  1)  Chest pain:  Ruled out. Cath today.  2)  Palpitations:  No sustained dysrhythmias on telemetry.  She will have her event recorder reviewed after it has been turned into our office  3)  Obesity:  Continue to educate.     4)  HTN:  BP is actually running low.  Continue current therapy.  5)  Hyperlipidemia:  LDL is 56.  Continue current therapy.  [name] 03/02/2012 7:25 AM   

## 2012-03-02 NOTE — Care Management Note (Signed)
    Page 1 of 1   03/02/2012     8:41:29 AM   CARE MANAGEMENT NOTE 03/02/2012  Patient:  [name], [name]   Account Number:  [id number]  Date Initiated:  03/02/2012  Documentation initiated by:  Junius Creamer  Subjective/Objective Assessment:   adm w ch pain     Action/Plan:   lives w husband, pcp dr Ninetta Lights [place]   Anticipated DC Date:     Anticipated DC Plan:        DC Planning Services  CM consult      Choice offered to / List presented to:             Status of service:   Medicare Important Message given?   (If response is "NO", the following Medicare IM given date fields will be blank) Date Medicare IM given:   Date Additional Medicare IM given:    Discharge Disposition:    Per UR Regulation:  Reviewed for med. necessity/level of care/duration of stay  If discussed at Long Length of Stay Meetings, dates discussed:    Comments:  6/6 8:40a debbie Zoriana Oats rn,bsn [phone]

## 2012-03-02 NOTE — Progress Notes (Signed)
ANTICOAGULATION CONSULT NOTE   Pharmacy Consult for Heparin Indication: chest pain/ACS  Allergies  Allergen Reactions  . Aspirin Other (See Comments)    Tachycardia  . Sulfa Drugs Cross Reactors Nausea And Vomiting    Patient Measurements: Height: 5\' 11"  (180.3 cm) Weight: 349 lb 10.4 oz (158.6 kg) IBW/kg (Calculated) : 70.8  Heparin Dosing Weight: 110 kg  Vital Signs: Temp: 97.3 F (36.3 C) (06/06 0400) Temp src: Oral (06/06 0400) BP: 106/56 mmHg (06/05 2300) Pulse Rate: 67  (06/05 2300)  Labs:  Basename 03/02/12 0223 03/01/12 2111 03/01/12 1641 03/01/12 1553  HGB 12.1 -- -- 13.6  HCT 35.4* -- -- 39.6  PLT 211 -- -- 265  APTT 62* -- -- --  LABPROT 14.7 -- -- --  INR 1.13 -- -- --  HEPARINUNFRC 0.19* -- -- --  CREATININE 0.75 -- -- 0.57  CKTOTAL 61 71 81 --  CKMB 1.6 1.6 1.6 --  TROPONINI <0.30 <0.30 <0.30 --    Estimated Creatinine Clearance: 168.8 ml/min (by C-G formula based on Cr of 0.75).  Assessment: 31 y.o. female with chest pain for Heparin Goal of Therapy:  Heparin level 0.3-0.7 units/ml Monitor platelets by anticoagulation protocol: Yes   Plan:  Heparin 2500 units IV bolus, then increase heparin 1900 units/hr F/U after cath  Geannie Risen, PharmD, BCPS  03/02/2012,4:26 AM

## 2012-03-02 NOTE — Interval H&P Note (Signed)
History and Physical Interval Note:  03/02/2012 10:46 AM  [name]  has presented today for surgery, with the diagnosis of cp  The various methods of treatment have been discussed with the patient and family. After consideration of risks, benefits and other options for treatment, the patient has consented to  Procedure(s) (LRB): LEFT HEART CATHETERIZATION WITH CORONARY ANGIOGRAM (N/A) as a surgical intervention .  The patients' history has been reviewed, patient examined, no change in status, stable for surgery.  I have reviewed the patients' chart and labs.  Questions were answered to the patient's satisfaction.     Timisha Mondry

## 2012-03-02 NOTE — CV Procedure (Signed)
    Cardiac Catheterization Operative Report  [name] [id number] 6/6/201311:36 AM FANTA,TESFAYE, MD, MD  Procedure Performed:  1. Left Heart Catheterization 2. Selective Coronary Angiography 3. Left ventricular angiogram  Operator: Verne Carrow, MD  Arterial access site:  Right radial artery.   Indication:  31 yo female with history of anterior STEMI January 2013 with bare metal stent placement mid LAD and cutting balloon angioplasty intermediate who is here today for cardiac cath after she was admitted yesterday with chest pain, negative cardiac enzymes.                             Procedure Details: The risks, benefits, complications, treatment options, and expected outcomes were discussed with the patient. The patient and/or family concurred with the proposed plan, giving informed consent. The patient was brought to the cath lab after IV hydration was begun and oral premedication was given. The patient was not sedated due to hypotension. The right wrist was assessed with an Allens test which was positive. The right wrist was prepped and draped in a sterile fashion. 1% lidocaine was used for local anesthesia. Using the modified Seldinger access technique, a 5 French sheath was placed in the right radial artery. 1.25 mg Nicardipine was given through the sheath. 6000 units IV heparin was given. Standard diagnostic catheters were used to perform selective coronary angiography. A pigtail catheter was used to perform a left ventricular angiogram. The sheath was removed from the right radial artery and a Terumo hemostasis band was applied at the arteriotomy site on the right wrist.   There were no immediate complications. The patient was taken to the recovery area in stable condition.   Hemodynamic Findings: Central aortic pressure: 108/70 Left ventricular pressure: 123/10/21  Angiographic Findings:  Left main: No obstructive disease noted.   Left Anterior Descending  Artery:  Large caliber vessel that courses to the apex. There is a stent present in the mid vessel that is widely patent with no restenosis. The remainder of the LAD is disease free. There is a moderate sized diagonal branch with 30% proximal stenosis.   Circumflex Artery: Dominant, large caliber vessel with no disease throughout the AV groove segment. There is small to moderate sized intermediate branch that has ostial 30% stenosis. The remainder of the Circumflex has no obstructive disease.   Right Coronary Artery: Small, non-dominant vessel. No disease noted.  Left Ventricular Angiogram: LVEF 50-55%.   Impression: 1. Double vessel CAD with patent stent mid LAD and patent angioplasty site at ostium of intermediate branch.  2. Normal LV systolic function   Recommendations: Continue medical management. Consider GI workup.        Complications:  None. The patient tolerated the procedure well.

## 2012-03-03 ENCOUNTER — Encounter (HOSPITAL_COMMUNITY): Payer: Self-pay | Admitting: Nurse Practitioner

## 2012-03-03 DIAGNOSIS — R0789 Other chest pain: Secondary | ICD-10-CM

## 2012-03-03 HISTORY — DX: Other chest pain: R07.89

## 2012-03-03 LAB — GLUCOSE, CAPILLARY: Glucose-Capillary: 93 mg/dL (ref 70–99)

## 2012-03-03 MED ORDER — METFORMIN HCL 500 MG PO TABS: 500.0000 mg | ORAL_TABLET | Freq: Two times a day (BID) | ORAL | Status: DC

## 2012-03-03 NOTE — Progress Notes (Signed)
Patient Name: [name] Date of Encounter: 03/03/2012   Principal Problem:  *Midsternal chest pain Active Problems:  Coronary artery disease  Morbid obesity  Tobacco abuse  Hyperlipidemia  DM2 (diabetes mellitus, type 2)  Anxiety   SUBJECTIVE  No chest pain or sob.  Feels relieved with results from cath.  Eager to go home.  CURRENT MEDS    . ALPRAZolam  0.25 mg Oral BID  . atorvastatin  80 mg Oral Daily  . carvedilol  12.5 mg Oral BID WC  . clopidogrel  75 mg Oral Daily  . diazepam  5 mg Oral On Call  . heparin      . heparin      . insulin aspart  0-15 Units Subcutaneous TID WC  . lidocaine      . nitroGLYCERIN      . sodium chloride  3 mL Intravenous Q12H   OBJECTIVE  Filed Vitals:   03/02/12 2101 03/03/12 0035 03/03/12 0512 03/03/12 0801  BP: 97/41 92/43 108/51 108/54  Pulse: 64 76 76 75  Temp: 98.5 F (36.9 C) 98.3 F (36.8 C) 98.1 F (36.7 C) 98 F (36.7 C)  TempSrc: Oral Oral Oral Oral  Resp: 18 16 19 20   Height:      Weight:  351 lb 10.1 oz (159.5 kg)    SpO2: 98% 100% 95% 97%    Intake/Output Summary (Last 24 hours) at 03/03/12 0839 Last data filed at 03/02/12 1006  Gross per 24 hour  Intake    244 ml  Output    550 ml  Net   -306 ml   Filed Weights   03/01/12 2000 03/02/12 0500 03/03/12 0035  Weight: 349 lb 10.4 oz (158.6 kg) 350 lb 1.5 oz (158.8 kg) 351 lb 10.1 oz (159.5 kg)    PHYSICAL EXAM  General: Pleasant, NAD. Neuro: Alert and oriented X 3. Moves all extremities spontaneously. Psych: Normal affect. HEENT:  Normal.  Hirsute.   Neck: Supple without bruits.  Obese, difficult to assess jvp. Lungs:  Resp regular and unlabored, CTA. Heart: RRR no s3, s4, or murmurs. Abdomen: Soft, non-tender, non-distended, BS + x 4.  Extremities: No clubbing, cyanosis or edema. DP/PT/Radials 2+ and equal bilaterally.  R wrist cath site w/o b/b/h.  Accessory Clinical Findings  CBC  Basename 03/02/12 0223 03/01/12 1553  WBC 9.8 10.3    NEUTROABS -- --  HGB 12.1 13.6  HCT 35.4* 39.6  MCV 89.6 88.6  PLT 211 265   Basic Metabolic Panel  Basename 03/02/12 0223 03/01/12 1553  NA 136 136  K 3.3* 3.8  CL 101 103  CO2 25 23  GLUCOSE 90 93  BUN 12 10  CREATININE 0.75 0.57  CALCIUM 9.1 9.9  MG -- --  PHOS -- --   Liver Function Tests  Basename 03/02/12 0223  AST 15  ALT 19  ALKPHOS 91  BILITOT 0.3  PROT 6.7  ALBUMIN 3.2*   Cardiac Enzymes  Basename 03/02/12 0830 03/02/12 0223 03/01/12 2111  CKTOTAL 69 61 71  CKMB 1.6 1.6 1.6  CKMBINDEX -- -- --  TROPONINI <0.30 <0.30 <0.30    D-Dimer  Basename 03/01/12 1641  DDIMER 0.50*   Hemoglobin A1C  Basename 03/02/12 0223  HGBA1C 5.3   Fasting Lipid Panel  Basename 03/02/12 0223  CHOL 114  HDL 26*  LDLCALC 56  TRIG 540*  CHOLHDL 4.4  LDLDIRECT --   Thyroid Function Tests  Basename 03/02/12 0223  TSH 0.973  T4TOTAL --  T3FREE --  THYROIDAB --   TELE  rsr  ECG  RSR, 72, poor R prog.  No acute changes.  ASSESSMENT AND PLAN  1. Midsternal Chest pain/cad:  S/p cath yesterday revealing patent stent and prev pci site (lad/Ramus).  No recurrent chest pain.  Pt believes anxiety likely playing a role in Ss and plans to f/u with Dr. Felecia Shelling (pcp) for additional mgmt.  Cont current cardiac meds - plavix, bb, statin (asa allergy).  D-dimer mildly elevated (0.50) - ? Clinical significance.  No further c/p, sob, no tachycardia - doubt PE.  2.  HTN:  Stable.    3. HL:  LDL 56.  LFT's wnl.  Cont current dose of statin.  4.  Tob Abuse:  Complete cessation advised.  She conts to smoke 3-5 cigs/day.  She says she is trying to cut back.  5.  Morbid Obesity:  May benefit from ETT @ some point with Rx for exercise.  6.  Anxiety:  On xanax @ home.  F/U PCP.  7.  Dispo:  Home today.  F/u in [place].  Signed, Nicolasa Ducking NP  History and all data above reviewed.  Patient examined.  I agree with the findings as above.  No further pain.  The patient  exam reveals COR:RRR  ,  Lungs: Clear  ,  Abd: Positive bowel sounds, no rebound no guarding, Ext Right wrist OK  .  All available labs, radiology testing, previous records reviewed. Agree with documented assessment and plan. OK to discharge.  Non anginal pain.  Very low clinical suspicion for pulmonary emboli.  Rollene Rotunda  9:39 AM  03/03/2012

## 2012-03-03 NOTE — Discharge Instructions (Signed)
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
## 2012-03-03 NOTE — Progress Notes (Signed)
Pt is smoking 4-5 cigarettes per day down from 1/2 ppd since she had her heart attack. congatulated pt on cutting down. She does need help with quitting completely. Recommended 2 mg nicotine gum to use for cravings in place of smoking. Pt agreeable. Discussed gum use instructions. Pt voices understanding. Referred to 1-800 quit now for f/u and support. Discussed oral fixation substitutes, second hand smoke and in home smoking policy. Reviewed and gave pt Written education/contact information.

## 2012-03-06 MED FILL — Nicardipine HCl IV Soln 2.5 MG/ML: INTRAVENOUS | Qty: 1 | Status: AC

## 2012-03-17 ENCOUNTER — Encounter: Payer: Medicaid Other | Admitting: Physician Assistant

## 2012-03-29 ENCOUNTER — Encounter: Payer: Medicaid Other | Admitting: Physician Assistant

## 2012-05-08 ENCOUNTER — Encounter: Payer: Self-pay | Admitting: Cardiology

## 2012-05-08 ENCOUNTER — Ambulatory Visit (INDEPENDENT_AMBULATORY_CARE_PROVIDER_SITE_OTHER): Payer: Medicaid Other | Admitting: Cardiology

## 2012-05-08 VITALS — BP 97/66 | HR 80 | Ht 71.0 in | Wt 335.0 lb

## 2012-05-08 DIAGNOSIS — R079 Chest pain, unspecified: Secondary | ICD-10-CM

## 2012-05-08 DIAGNOSIS — I2510 Atherosclerotic heart disease of native coronary artery without angina pectoris: Secondary | ICD-10-CM

## 2012-05-08 DIAGNOSIS — E785 Hyperlipidemia, unspecified: Secondary | ICD-10-CM

## 2012-05-08 DIAGNOSIS — Z720 Tobacco use: Secondary | ICD-10-CM

## 2012-05-08 DIAGNOSIS — R002 Palpitations: Secondary | ICD-10-CM

## 2012-05-08 DIAGNOSIS — F17200 Nicotine dependence, unspecified, uncomplicated: Secondary | ICD-10-CM

## 2012-05-08 MED ORDER — ATORVASTATIN CALCIUM 40 MG PO TABS: 40.0000 mg | ORAL_TABLET | Freq: Every day | ORAL | Status: DC

## 2012-05-08 NOTE — Progress Notes (Signed)
HPI The patient presents for follow up of CAD.  Because of chest pain she did have a followup catheterization on 6/6 which revealed nonobstructive CAD with patent LAD stent and previous PTCA site in the ramus. LV function was normal. Since that time the patient has had no chest discomfort, neck or arm discomfort. She's had no new palpitations, presyncope or syncope. She denies any PND or orthopnea. She has been doing her walking. She's had no significant limitations with this. Her biggest complaint has been cold intolerance.   Allergies  Allergen Reactions  . Aspirin Other (See Comments)    Tachycardia  . Sulfa Drugs Cross Reactors Nausea And Vomiting    Current Outpatient Prescriptions  Medication Sig Dispense Refill  . ALPRAZolam (XANAX) 0.25 MG tablet Take 0.25 mg by mouth 3 (three) times daily.       Marland Kitchen atorvastatin (LIPITOR) 80 MG tablet Take 80 mg by mouth daily.      . carvedilol (COREG) 12.5 MG tablet Take 12.5 mg by mouth 2 (two) times daily with a meal.      . clopidogrel (PLAVIX) 75 MG tablet Take 75 mg by mouth daily.      . metFORMIN (GLUCOPHAGE) 500 MG tablet Take 1 tablet (500 mg total) by mouth 2 (two) times daily with a meal.      . nitroGLYCERIN (NITROSTAT) 0.4 MG SL tablet Place 0.4 mg under the tongue every 5 (five) minutes x 3 doses as needed. For chest pain       . Skin Protectants, Misc. (EUCERIN) cream Apply 1 application topically as needed. For dry hands         Past Medical History  Diagnosis Date  . Morbid obesity   . Migraines   . Hypertension   . Hyperlipidemia   . Coronary artery disease 09/2011    a. Anterior MI. PCI and BMS to LAD and angioplsty to ostial Ramus;  b. 02/2012 Cath patent stent and ptca site, med rx.  . Anxiety     Past Surgical History  Procedure Date  . Cholecystectomy   . Cesarean section   . Cardiac catheterization 10/08/2011    LAD: 95% mid, Ramus: 95% ostial  . Coronary angioplasty with stent placement 10/08/2011    LAD: BMS,  Ramus: cutting balloon angioplasty    ROS:  As stated in the HPI and negative for all other systems.  PHYSICAL EXAM BP 97/66  Pulse 80  Ht 5\' 11"  (1.803 m)  Wt 335 lb (151.955 kg)  BMI 46.72 kg/m2 GENERAL:  Well appearing HEENT:  Pupils equal round and reactive, fundi not visualized, oral mucosa unremarkable NECK:  No jugular venous distention, waveform within normal limits, carotid upstroke brisk and symmetric, no bruits, no thyromegaly LYMPHATICS:  No cervical, inguinal adenopathy LUNGS:  Clear to auscultation bilaterally BACK:  No CVA tenderness CHEST:  Unremarkable HEART:  PMI not displaced or sustained,S1 and S2 within normal limits, no S3, no S4, no clicks, no rubs, no murmurs ABD:  Flat, positive bowel sounds normal in frequency in pitch, no bruits, no rebound, no guarding, no midline pulsatile mass, no hepatomegaly, no splenomegaly, obese EXT:  2 plus pulses throughout, no edema, no cyanosis no clubbing, right wrist without bruising or scarring at previous cath site   ASSESSMENT AND PLAN   Palpitations -  These are no longer problematic. No change in therapy is indicated.  Coronary artery disease -  The patient given the results of her recent catheterization will be managed  with risk reduction.  Tobacco abuse -  Patient was encouraged to continue on current course to stop smoking tobacco altogether.  Hypokalemia  - I will check a basic metabolic profile.   Hyperlipidemia  - I will reduce her Lipitor to 40 mg daily as her LDL was only 53. We can follow this up again at the next appointment.

## 2012-05-08 NOTE — Patient Instructions (Addendum)
Your physician recommends that you schedule a follow-up appointment in: 4 months with Dr. Antoine Poche. You will receive a reminder letter in the mail in about 2 months reminding you to call and schedule your appointment. If you don't receive this letter, please contact our office.  Your physician has recommended you make the following change in your medication: Decrease lipitor to 40 mg daily. You may break your 80 mg tablets in half until they are finished. Your new prescription has been sent to your pharmacy. Your physician recommends that you return for lab work tomorrow August 13 th 2013 at [facility].

## 2012-05-15 ENCOUNTER — Telehealth: Payer: Self-pay | Admitting: *Deleted

## 2012-05-15 NOTE — Telephone Encounter (Signed)
Patient informed. 

## 2012-05-15 NOTE — Telephone Encounter (Signed)
Message copied by Eustace Moore on Mon May 15, 2012  4:46 PM ------      Message from: FLEMING, PAMELA J      Created: Mon May 15, 2012  8:18 AM                   ----- Message -----         From: Rollene Rotunda, MD         Sent: 05/11/2012   9:25 PM           To: Rocco Serene, RN            Labs OK.

## 2012-06-23 ENCOUNTER — Emergency Department (HOSPITAL_COMMUNITY)
Admission: EM | Admit: 2012-06-23 | Discharge: 2012-06-23 | Disposition: A | Discharge status: Home / Self Care | Payer: Medicaid Other | Attending: Emergency Medicine | Admitting: Emergency Medicine

## 2012-06-23 ENCOUNTER — Encounter (HOSPITAL_COMMUNITY): Payer: Self-pay | Admitting: *Deleted

## 2012-06-23 DIAGNOSIS — I2510 Atherosclerotic heart disease of native coronary artery without angina pectoris: Secondary | ICD-10-CM | POA: Insufficient documentation

## 2012-06-23 DIAGNOSIS — Z9861 Coronary angioplasty status: Secondary | ICD-10-CM | POA: Insufficient documentation

## 2012-06-23 DIAGNOSIS — E119 Type 2 diabetes mellitus without complications: Secondary | ICD-10-CM | POA: Insufficient documentation

## 2012-06-23 DIAGNOSIS — E785 Hyperlipidemia, unspecified: Secondary | ICD-10-CM | POA: Insufficient documentation

## 2012-06-23 DIAGNOSIS — F17200 Nicotine dependence, unspecified, uncomplicated: Secondary | ICD-10-CM | POA: Insufficient documentation

## 2012-06-23 DIAGNOSIS — F411 Generalized anxiety disorder: Secondary | ICD-10-CM | POA: Insufficient documentation

## 2012-06-23 DIAGNOSIS — K029 Dental caries, unspecified: Secondary | ICD-10-CM

## 2012-06-23 DIAGNOSIS — I10 Essential (primary) hypertension: Secondary | ICD-10-CM | POA: Insufficient documentation

## 2012-06-23 MED ORDER — AMOXICILLIN 500 MG PO CAPS: 500.0000 mg | ORAL_CAPSULE | Freq: Three times a day (TID) | ORAL | Status: DC

## 2012-06-23 MED ORDER — HYDROCODONE-ACETAMINOPHEN 5-325 MG PO TABS: 1.0000 | ORAL_TABLET | ORAL | Status: DC | PRN

## 2012-06-23 MED ORDER — CEFTRIAXONE SODIUM 1 G IJ SOLR
1.0000 g | Freq: Once | INTRAMUSCULAR | Status: AC
  Administered 2012-06-23: 1 g via INTRAMUSCULAR
  Filled 2012-06-23: qty 10

## 2012-06-23 MED ORDER — PROMETHAZINE HCL 12.5 MG PO TABS
12.5000 mg | ORAL_TABLET | Freq: Once | ORAL | Status: AC
  Administered 2012-06-23: 12.5 mg via ORAL
  Filled 2012-06-23: qty 1

## 2012-06-23 MED ORDER — HYDROCODONE-ACETAMINOPHEN 5-325 MG PO TABS
2.0000 | ORAL_TABLET | Freq: Once | ORAL | Status: AC
  Administered 2012-06-23: 2 via ORAL
  Filled 2012-06-23: qty 2

## 2012-06-23 MED ORDER — LIDOCAINE HCL (PF) 2 % IJ SOLN
INTRAMUSCULAR | Status: AC
  Filled 2012-06-23: qty 10

## 2012-06-23 NOTE — ED Provider Notes (Signed)
History     CSN: [id number]  Arrival date & time 06/23/12  1603   First MD Initiated Contact with Patient 06/23/12 1708      Chief Complaint  Patient presents with  . Dental Pain    (Consider location/radiation/quality/duration/timing/severity/associated sxs/prior treatment) Patient is a 31 y.o. female presenting with tooth pain. The history is provided by the patient.  Dental PainThe primary symptoms include mouth pain and headaches. Primary symptoms do not include shortness of breath or cough. The symptoms began 3 to 5 days ago. The symptoms are worsening. The symptoms occur constantly.  The headache is not associated with photophobia.  Additional symptoms include: dental sensitivity to temperature, gum swelling, jaw pain and facial swelling. Additional symptoms do not include: nosebleeds. Medical issues include: smoking.    Past Medical History  Diagnosis Date  . Morbid obesity   . Migraines   . Hypertension   . Hyperlipidemia   . Anxiety   . Coronary artery disease 09/2011    a. Anterior MI. PCI and BMS to LAD and angioplsty to ostial Ramus;  b. 02/2012 Cath patent stent and ptca site, med rx.  . Diabetes mellitus     Past Surgical History  Procedure Date  . Cholecystectomy   . Cesarean section   . Coronary angioplasty with stent placement 10/08/2011    LAD: BMS, Ramus: cutting balloon angioplasty  . Cardiac catheterization 10/08/2011    LAD: 95% mid, Ramus: 95% ostial    Family History  Problem Relation Age of Onset  . Heart attack Father     History  Substance Use Topics  . Smoking status: Current Every Day Smoker -- 0.3 packs/day for 20 years    Types: Cigarettes  . Smokeless tobacco: Never Used  . Alcohol Use: No    OB History    Grav Para Term Preterm Abortions TAB SAB Ect Mult Living                  Review of Systems  Constitutional: Negative for activity change.       All ROS Neg except as noted in HPI  HENT: Positive for facial swelling and  dental problem. Negative for nosebleeds and neck pain.   Eyes: Negative for photophobia and discharge.  Respiratory: Negative for cough, shortness of breath and wheezing.   Cardiovascular: Positive for chest pain. Negative for palpitations.  Gastrointestinal: Negative for abdominal pain and blood in stool.  Genitourinary: Negative for dysuria, frequency and hematuria.  Musculoskeletal: Negative for back pain and arthralgias.  Skin: Negative.   Neurological: Positive for headaches. Negative for dizziness, seizures and speech difficulty.  Psychiatric/Behavioral: Negative for hallucinations and confusion. The patient is nervous/anxious.     Allergies  Aspirin and Sulfa drugs cross reactors  Home Medications   Current Outpatient Rx  Name Route Sig Dispense Refill  . ALPRAZOLAM 0.25 MG PO TABS Oral Take 0.25 mg by mouth 3 (three) times daily.     . ATORVASTATIN CALCIUM 40 MG PO TABS Oral Take 1 tablet (40 mg total) by mouth daily. 30 tablet 6    DOSE DECREASE  . CARVEDILOL 12.5 MG PO TABS Oral Take 12.5 mg by mouth 2 (two) times daily with a meal.    . CLOPIDOGREL BISULFATE 75 MG PO TABS Oral Take 75 mg by mouth daily.    Marland Kitchen METFORMIN HCL 500 MG PO TABS Oral Take 1 tablet (500 mg total) by mouth 2 (two) times daily with a meal.  Resume on 03/05/2012  . NITROGLYCERIN 0.4 MG SL SUBL Sublingual Place 0.4 mg under the tongue every 5 (five) minutes x 3 doses as needed. For chest pain     . EUCERIN EX CREA Topical Apply 1 application topically as needed. For dry hands       BP 139/82  Pulse 94  Temp 99.6 F (37.6 C) (Oral)  Resp 20  Ht 5\' 11"  (1.803 m)  Wt 296 lb (134.265 kg)  BMI 41.28 kg/m2  SpO2 100%  LMP 05/15/2012  Physical Exam  Nursing note and vitals reviewed. Constitutional: She is oriented to person, place, and time. She appears well-developed and well-nourished.  Non-toxic appearance.  HENT:  Head: Normocephalic.  Right Ear: Tympanic membrane and external ear normal.    Left Ear: Tympanic membrane and external ear normal.       Patient has poor dentition throughout the upper and lower jaw areas. There are 2 deep cavities of the first and second molar on the right lower jaw area. There is some swelling around the gum in this area. There is no visible abscess appreciated. There is no swelling under the tongue area. The airway is patent.  Eyes: EOM and lids are normal. Pupils are equal, round, and reactive to light.  Neck: Normal range of motion. Neck supple. Carotid bruit is not present.       Soreness with range of motion of the neck. No palpable lymphadenopathy appreciated.  Cardiovascular: Normal rate, regular rhythm, normal heart sounds, intact distal pulses and normal pulses.   Pulmonary/Chest: Breath sounds normal. No respiratory distress.  Abdominal: Soft. Bowel sounds are normal. There is no tenderness. There is no guarding.  Musculoskeletal: Normal range of motion.  Lymphadenopathy:       Head (right side): No submandibular adenopathy present.       Head (left side): No submandibular adenopathy present.    She has no cervical adenopathy.  Neurological: She is alert and oriented to person, place, and time. She has normal strength. No cranial nerve deficit or sensory deficit.  Skin: Skin is warm and dry.  Psychiatric: She has a normal mood and affect. Her speech is normal.    ED Course  Procedures (including critical care time)  Labs Reviewed - No data to display No results found.   No diagnosis found.    MDM  I have reviewed nursing notes, vital signs, and all appropriate lab and imaging results for this patient. Patient is a diabetic coronary artery disease patient who has advanced dental disease. She now has swelling around the gums of the right lower jaw. She feels a sensation of swelling of her face extending around her chin. The patient is treated in the emergency department with IM Rocephin. Prescription given for amoxicillin, and Norco  for pain #20 tablets. Patient strongly advised to see a dentist as sone as possible. Patient advised to notify her cardiac physicians of the medications ordered.       Kathie Dike, [place] 06/23/12 1728

## 2012-06-23 NOTE — ED Provider Notes (Signed)
Medical screening examination/treatment/procedure(s) were performed by non-physician practitioner and as supervising physician I was immediately available for consultation/collaboration.   Yaritzel Stange L Jasman Murri, MD 06/23/12 1853 

## 2012-06-23 NOTE — ED Notes (Signed)
Dental pain for 1 week rt mandibular  Pain.  With swelling.

## 2012-06-25 ENCOUNTER — Emergency Department (HOSPITAL_COMMUNITY)
Admission: EM | Admit: 2012-06-25 | Discharge: 2012-06-25 | DRG: 603 | Discharge status: Against Medical Advice | Payer: Medicaid Other | Attending: Emergency Medicine | Admitting: Emergency Medicine

## 2012-06-25 ENCOUNTER — Emergency Department (HOSPITAL_COMMUNITY): Payer: Medicaid Other

## 2012-06-25 ENCOUNTER — Encounter (HOSPITAL_COMMUNITY): Payer: Self-pay | Admitting: Emergency Medicine

## 2012-06-25 DIAGNOSIS — L0201 Cutaneous abscess of face: Secondary | ICD-10-CM | POA: Diagnosis present

## 2012-06-25 DIAGNOSIS — I10 Essential (primary) hypertension: Secondary | ICD-10-CM | POA: Insufficient documentation

## 2012-06-25 DIAGNOSIS — E119 Type 2 diabetes mellitus without complications: Secondary | ICD-10-CM | POA: Insufficient documentation

## 2012-06-25 DIAGNOSIS — Z6839 Body mass index (BMI) 39.0-39.9, adult: Secondary | ICD-10-CM

## 2012-06-25 DIAGNOSIS — L03211 Cellulitis of face: Secondary | ICD-10-CM | POA: Diagnosis present

## 2012-06-25 DIAGNOSIS — I2510 Atherosclerotic heart disease of native coronary artery without angina pectoris: Secondary | ICD-10-CM

## 2012-06-25 DIAGNOSIS — Z9089 Acquired absence of other organs: Secondary | ICD-10-CM | POA: Insufficient documentation

## 2012-06-25 DIAGNOSIS — Z79899 Other long term (current) drug therapy: Secondary | ICD-10-CM | POA: Insufficient documentation

## 2012-06-25 DIAGNOSIS — L0390 Cellulitis, unspecified: Secondary | ICD-10-CM

## 2012-06-25 LAB — COMPREHENSIVE METABOLIC PANEL
ALT: 16 U/L (ref 0–35)
AST: 13 U/L (ref 0–37)
Albumin: 3.6 g/dL (ref 3.5–5.2)
Alkaline Phosphatase: 84 U/L (ref 39–117)
BUN: 8 mg/dL (ref 6–23)
CO2: 24 mEq/L (ref 19–32)
Calcium: 9.7 mg/dL (ref 8.4–10.5)
Chloride: 98 mEq/L (ref 96–112)
Creatinine, Ser: 0.57 mg/dL (ref 0.50–1.10)
GFR calc Af Amer: >90 mL/min (ref >90)
GFR calc non Af Amer: >90 mL/min (ref >90)
Glucose, Bld: 96 mg/dL (ref 70–99)
Potassium: 3.8 mEq/L (ref 3.5–5.1)
Sodium: 133 mEq/L — ABNORMAL LOW (ref 135–145)
Total Bilirubin: 0.7 mg/dL (ref 0.3–1.2)
Total Protein: 8 g/dL (ref 6.0–8.3)

## 2012-06-25 LAB — CBC WITH DIFFERENTIAL/PLATELET
Basophils Absolute: 0 10*3/uL (ref 0.0–0.1)
Basophils Relative: 0 % (ref 0–1)
Eosinophils Absolute: 0.1 10*3/uL (ref 0.0–0.7)
Eosinophils Relative: 1 % (ref 0–5)
HCT: 37.1 % (ref 36.0–46.0)
Hemoglobin: 12.5 g/dL (ref 12.0–15.0)
Lymphocytes Relative: 18 % (ref 12–46)
Lymphs Abs: 2.1 10*3/uL (ref 0.7–4.0)
MCH: 29.8 pg (ref 26.0–34.0)
MCHC: 33.7 g/dL (ref 30.0–36.0)
MCV: 88.5 fL (ref 78.0–100.0)
Monocytes Absolute: 1.2 10*3/uL — ABNORMAL HIGH (ref 0.1–1.0)
Monocytes Relative: 10 % (ref 3–12)
Neutro Abs: 8.6 10*3/uL — ABNORMAL HIGH (ref 1.7–7.7)
Neutrophils Relative %: 72 % (ref 43–77)
Platelets: 232 10*3/uL (ref 150–400)
RBC: 4.19 MIL/uL (ref 3.87–5.11)
RDW: 13.3 % (ref 11.5–15.5)
WBC: 12 10*3/uL — ABNORMAL HIGH (ref 4.0–10.5)

## 2012-06-25 MED ORDER — CARVEDILOL 12.5 MG PO TABS
12.5000 mg | ORAL_TABLET | Freq: Two times a day (BID) | ORAL | Status: DC
  Filled 2012-06-25 (×3): qty 1

## 2012-06-25 MED ORDER — IOHEXOL 300 MG/ML  SOLN
75.0000 mL | Freq: Once | INTRAMUSCULAR | Status: AC | PRN
  Administered 2012-06-25: 75 mL via INTRAVENOUS

## 2012-06-25 MED ORDER — ONDANSETRON HCL 4 MG/2ML IJ SOLN: 4.0000 mg | Freq: Four times a day (QID) | INTRAMUSCULAR | Status: DC | PRN

## 2012-06-25 MED ORDER — CLOPIDOGREL BISULFATE 75 MG PO TABS: 75.0000 mg | ORAL_TABLET | Freq: Every day | ORAL | Status: DC

## 2012-06-25 MED ORDER — INSULIN ASPART 100 UNIT/ML ~~LOC~~ SOLN: 0.0000 [IU] | Freq: Three times a day (TID) | SUBCUTANEOUS | Status: DC

## 2012-06-25 MED ORDER — ENOXAPARIN SODIUM 40 MG/0.4ML ~~LOC~~ SOLN: 40.0000 mg | SUBCUTANEOUS | Status: DC

## 2012-06-25 MED ORDER — METFORMIN HCL 500 MG PO TABS: 500.0000 mg | ORAL_TABLET | Freq: Two times a day (BID) | ORAL | Status: DC

## 2012-06-25 MED ORDER — ATORVASTATIN CALCIUM 40 MG PO TABS
40.0000 mg | ORAL_TABLET | Freq: Every day | ORAL | Status: DC
  Filled 2012-06-25 (×3): qty 1

## 2012-06-25 MED ORDER — ACETAMINOPHEN 650 MG RE SUPP: 650.0000 mg | Freq: Four times a day (QID) | RECTAL | Status: DC | PRN

## 2012-06-25 MED ORDER — INSULIN ASPART 100 UNIT/ML ~~LOC~~ SOLN: 0.0000 [IU] | Freq: Every day | SUBCUTANEOUS | Status: DC

## 2012-06-25 MED ORDER — HYDROCODONE-ACETAMINOPHEN 5-325 MG PO TABS: 1.0000 | ORAL_TABLET | ORAL | Status: DC | PRN

## 2012-06-25 MED ORDER — SODIUM CHLORIDE 0.9 % IV SOLN: INTRAVENOUS | Status: DC

## 2012-06-25 MED ORDER — ACETAMINOPHEN 325 MG PO TABS: 650.0000 mg | ORAL_TABLET | Freq: Four times a day (QID) | ORAL | Status: DC | PRN

## 2012-06-25 MED ORDER — PIPERACILLIN-TAZOBACTAM 3.375 G IVPB: 3.3750 g | Freq: Three times a day (TID) | INTRAVENOUS | Status: DC

## 2012-06-25 MED ORDER — VANCOMYCIN HCL IN DEXTROSE 1-5 GM/200ML-% IV SOLN
1000.0000 mg | Freq: Once | INTRAVENOUS | Status: AC
  Administered 2012-06-25: 1000 mg via INTRAVENOUS
  Filled 2012-06-25: qty 200

## 2012-06-25 MED ORDER — ALUM & MAG HYDROXIDE-SIMETH 200-200-20 MG/5ML PO SUSP
30.0000 mL | Freq: Four times a day (QID) | ORAL | Status: DC | PRN
Start: 2012-06-25 — End: 2012-06-26

## 2012-06-25 MED ORDER — ONDANSETRON HCL 4 MG PO TABS: 4.0000 mg | ORAL_TABLET | Freq: Four times a day (QID) | ORAL | Status: DC | PRN

## 2012-06-25 MED ORDER — NITROGLYCERIN 0.4 MG SL SUBL: 0.4000 mg | SUBLINGUAL_TABLET | SUBLINGUAL | Status: DC | PRN

## 2012-06-25 NOTE — ED Notes (Signed)
Pt voicing concerns about admission. Pt states she doesn't want to be admitted.

## 2012-06-25 NOTE — ED Provider Notes (Addendum)
History  This chart was scribed for Katelyn Lennert, MD by Erskine Emery. This patient was seen in room [facility] and the patient's care was started at 16:36.   CSN: [id number]  Arrival date & time 06/25/12  1422   First MD Initiated Contact with Patient 06/25/12 1636      Chief Complaint  Patient presents with  . Abscess    (Consider location/radiation/quality/duration/timing/severity/associated sxs/prior Treatment) [name] is a 31 y.o. female who presents to the Emergency Department complaining of a dental abscess since Wednesday (5 days ago). Pt reports the abscess got bad enough that she couldn't open her mouth this Friday so she came to [facility]. Pt was given a shot and sent home with amoxicillin and Toradol. Pt reports since then the swelling has progressed, her face has become numb, and she has noticed discoloration. Pt reports a h/o CAD and stent placement. Patient is a 31 y.o. female presenting with abscess. The history is provided by the patient and the spouse. No language interpreter was used.  Abscess  This is a new problem. The current episode started less than one week ago. The onset was gradual. The problem occurs continuously. The problem has been gradually worsening. The abscess is present on the face. The problem is moderate. The abscess is characterized by painfulness and swelling. It is unknown what she was exposed to. Pertinent negatives include no diarrhea, no congestion and no cough. Recently, medical care has been given at this facility. Services received include medications given (Toradol and amoxicilin).    Past Medical History  Diagnosis Date  . Morbid obesity   . Migraines   . Hypertension   . Hyperlipidemia   . Anxiety   . Coronary artery disease 09/2011    a. Anterior MI. PCI and BMS to LAD and angioplsty to ostial Ramus;  b. 02/2012 Cath patent stent and ptca site, med rx.  . Diabetes mellitus     Past Surgical History  Procedure Date  .  Cholecystectomy   . Cesarean section   . Coronary angioplasty with stent placement 10/08/2011    LAD: BMS, Ramus: cutting balloon angioplasty  . Cardiac catheterization 10/08/2011    LAD: 95% mid, Ramus: 95% ostial    Family History  Problem Relation Age of Onset  . Heart attack Father     History  Substance Use Topics  . Smoking status: Current Every Day Smoker -- 0.3 packs/day for 20 years    Types: Cigarettes  . Smokeless tobacco: Never Used  . Alcohol Use: No    OB History    Grav Para Term Preterm Abortions TAB SAB Ect Mult Living                  Review of Systems  Constitutional: Negative for fatigue.  HENT: Positive for facial swelling, mouth sores, trouble swallowing and dental problem. Negative for congestion, sinus pressure and ear discharge.   Eyes: Negative for discharge.  Respiratory: Negative for cough.   Cardiovascular: Negative for chest pain.  Gastrointestinal: Negative for abdominal pain and diarrhea.  Genitourinary: Negative for frequency and hematuria.  Musculoskeletal: Negative for back pain.  Skin: Negative for rash.  Neurological: Negative for seizures and headaches.  Hematological: Negative.   Psychiatric/Behavioral: Negative for hallucinations.  All other systems reviewed and are negative.    Allergies  Aspirin; Bee venom; and Sulfa drugs cross reactors  Home Medications   Current Outpatient Rx  Name Route Sig Dispense Refill  . AMOXICILLIN 500 MG  PO CAPS Oral Take 1 capsule (500 mg total) by mouth 3 (three) times daily. 21 capsule 0  . ATORVASTATIN CALCIUM 40 MG PO TABS Oral Take 1 tablet (40 mg total) by mouth daily. 30 tablet 6    DOSE DECREASE  . CARVEDILOL 12.5 MG PO TABS Oral Take 12.5 mg by mouth 2 (two) times daily with a meal.    . CLOPIDOGREL BISULFATE 75 MG PO TABS Oral Take 75 mg by mouth daily.    Marland Kitchen HYDROCODONE-ACETAMINOPHEN 5-325 MG PO TABS Oral Take 1 tablet by mouth every 4 (four) hours as needed for pain. 20 tablet 0  .  METFORMIN HCL 500 MG PO TABS Oral Take 1 tablet (500 mg total) by mouth 2 (two) times daily with a meal.      Resume on 03/05/2012  . EUCERIN EX CREA Topical Apply 1 application topically as needed. For dry hands     . NITROGLYCERIN 0.4 MG SL SUBL Sublingual Place 0.4 mg under the tongue every 5 (five) minutes x 3 doses as needed. For chest pain       Triage Vitals: BP 99/61  Pulse 87  Temp 99.5 F (37.5 C)  Resp 20  Ht 5\' 11"  (1.803 m)  Wt 286 lb (129.729 kg)  BMI 39.89 kg/m2  SpO2 100%  LMP 05/15/2012  Physical Exam  Nursing note and vitals reviewed. Constitutional: She is oriented to person, place, and time. She appears well-developed.  HENT:  Head: Normocephalic and atraumatic.       Tender to lower right molar.  Eyes: Conjunctivae normal and EOM are normal. No scleral icterus.  Neck: Neck supple. No thyromegaly present.       Swelling to the left side of upper neck with tenderness and redness.  Cardiovascular: Normal rate and regular rhythm.  Exam reveals no friction rub.   No murmur heard. Pulmonary/Chest: No stridor. She has no wheezes. She exhibits no tenderness.  Abdominal: There is no tenderness.  Musculoskeletal: Normal range of motion. She exhibits no edema.  Neurological: She is oriented to person, place, and time. Coordination normal.  Skin: No rash noted. No erythema.  Psychiatric: She has a normal mood and affect. Her behavior is normal.    ED Course  Procedures (including critical care time) DIAGNOSTIC STUDIES: Oxygen Saturation is 100% on room air, normal by my interpretation.    COORDINATION OF CARE: 16:39--I evaluated the patient and we discussed a treatment plan including IV antibiotics, x-ray, and pain medication to which the pt agreed.   18:30--I rechecked the pt.  19:40--I rechecked the pt. We discussed her results and she agreed to be admitted. Dr. Felecia Shelling is currently the pt's PCP.  19:57--I rechecked the pt who is debating whether or not she  wants to be admitted.  20:18--I rechecked the pt who has decided to stay.  Labs Reviewed - No data to display No results found.   No diagnosis found.    MDM        The chart was scribed for me under my direct supervision.  I personally performed the history, physical, and medical decision making and all procedures in the evaluation of this patient.. Pt was seen by dr. Ouida Sills and she decided to leave ama  Katelyn Lennert, MD 06/25/12 1610  Katelyn Lennert, MD 06/25/12 2117

## 2012-06-25 NOTE — Progress Notes (Signed)
ANTIBIOTIC CONSULT NOTE - INITIAL  Pharmacy Consult for Zosyn Indication: cellulitis/abscess  Allergies  Allergen Reactions  . Aspirin Other (See Comments)    Tachycardia  . Bee Venom Swelling  . Sulfa Drugs Cross Reactors Nausea And Vomiting    Patient Measurements: Height: 5\' 11"  (180.3 cm) Weight: 286 lb (129.729 kg) IBW/kg (Calculated) : 70.8   Vital Signs: Temp: 99.5 F (37.5 C) (09/29 1436) BP: 99/61 mmHg (09/29 1436) Pulse Rate: 87  (09/29 1436) Intake/Output from previous day:   Intake/Output from this shift:    Labs:  Basename 06/25/12 1655  WBC 12.0*  HGB 12.5  PLT 232  LABCREA --  CREATININE 0.57   Estimated Creatinine Clearance: 149.1 ml/min (by C-G formula based on Cr of 0.57). No results found for this basename: VANCOTROUGH:2,VANCOPEAK:2,VANCORANDOM:2,GENTTROUGH:2,GENTPEAK:2,GENTRANDOM:2,TOBRATROUGH:2,TOBRAPEAK:2,TOBRARND:2,AMIKACINPEAK:2,AMIKACINTROU:2,AMIKACIN:2, in the last 72 hours   Microbiology: No results found for this or any previous visit (from the past 720 hour(s)).  Medical History: Past Medical History  Diagnosis Date  . Morbid obesity   . Migraines   . Hypertension   . Hyperlipidemia   . Anxiety   . Coronary artery disease 09/2011    a. Anterior MI. PCI and BMS to LAD and angioplsty to ostial Ramus;  b. 02/2012 Cath patent stent and ptca site, med rx.  . Diabetes mellitus     Medications:  Scheduled:    . atorvastatin  40 mg Oral Daily  . carvedilol  12.5 mg Oral BID WC  . clopidogrel  75 mg Oral Daily  . enoxaparin (LOVENOX) injection  40 mg Subcutaneous Q24H  . insulin aspart  0-20 Units Subcutaneous TID WC  . insulin aspart  0-5 Units Subcutaneous QHS  . metFORMIN  500 mg Oral BID WC   Assessment: 31 yo F with dental abscess that has worsened after IM dose of amoxicillin. She has received Vancomycin 1gm in ED. Admission orders to start on Zosyn.  Excellent renal function.   Goal of Therapy:  Eradicate  infection.  Plan:  1) Zosyn 3.375gm IV Q8h to be infused over 4hrs 2) Monitor renal function and cx data   Elson Clan 06/25/2012,9:14 PM

## 2012-06-25 NOTE — ED Notes (Signed)
Pt c/o dental abscess that is causing difficulty breathing d/t throat swelling. Pt was treated at [facility] on Friday and released. Pt stated her infection has gotten worse since she started the anitbiotics given to ger Friday.

## 2012-06-26 LAB — HEMOGLOBIN A1C
Hgb A1c MFr Bld: 5.3 % (ref <5.7)
Mean Plasma Glucose: 105 mg/dL (ref <117)

## 2012-06-27 NOTE — Progress Notes (Signed)
NAMEJAYLI, [name] NO.:  [id number]  MEDICAL RECORD NO.:  [id number]  LOCATION:  [facility]                         FACILITY:  [facility]  PHYSICIAN:  Kingsley Callander. Ouida Sills, MD       DATE OF BIRTH:  10-20-1980  DATE OF PROCEDURE: DATE OF DISCHARGE:  06/25/2012                                PROGRESS NOTE   This patient is a 31 year old white female patient of Dr. Felecia Shelling who presented to the emergency room with pain, redness, and swelling in her upper neck under her chin.  She had previously been in the emergency room 2 days prior to this visit and was treated for dental pain with amoxicillin on re-evaluation, she has since developed a cellulitis involving the soft tissues of the anterior neck/chin region.  There was no evidence of abscess.  The patient was treated with IV vancomycin, and she was advised to initiate continued IV therapy in the hospital.  She has a history of diabetes as well as hypertension and coronary heart disease.  PHYSICAL EXAMINATION:  GENERAL:  She was in no distress and was breathing comfortably. HEENT:  Oropharynx reveals carious dentition.  Eyes, nose, TMs are unremarkable.  She has marked swelling with redness and increased warmth under her chin.  There are no palpably enlarged lymph nodes. LUNGS:  Clear. HEART:  Regular with no murmurs. ABDOMEN:  Overweight and nontender with no hepatosplenomegaly. EXTREMITIES:  Reveal no clubbing or edema. NEUROLOGIC:  Grossly intact.  IMPRESSION/PLAN:  Cellulitis.  Admission advised for IV antibiotic therapy.  The patient and her husband declined admission stating that they will lose their house and subsequently lose their 2 children.  She will therefore be treated with Augmentin 875 mg b.i.d.  She was advised to follow up with Dr. Felecia Shelling and to follow up with her dentist in 1 day. As stated, she is leaving against medical advice.  This has been discussed with Dr. Estell Harpin, the emergency room physician as well, who  is initially seeing the patient.     Kingsley Callander. Ouida Sills, MD     ROF/MEDQ  D:  06/26/2012  T:  06/27/2012  Job:  [id number]

## 2012-07-07 ENCOUNTER — Telehealth: Payer: Self-pay | Admitting: Cardiology

## 2012-07-07 NOTE — Telephone Encounter (Signed)
[name] called stating that she is seeing [name] Dentist [phone]. They told her that she needs a letter stating That it is ok to have her tooth pulled and does she need to come off any medications.

## 2012-07-07 NOTE — Telephone Encounter (Signed)
She should not come off the Plavix for an elective procedure until January of next year.  She is OK to have the tooth pulled now if they will do it on the Plavix.  [name]

## 2012-07-10 NOTE — Telephone Encounter (Signed)
Patient informed and copy of message sent to [facility]. [phone].

## 2012-10-05 ENCOUNTER — Other Ambulatory Visit: Payer: Self-pay | Admitting: *Deleted

## 2012-10-05 DIAGNOSIS — I2510 Atherosclerotic heart disease of native coronary artery without angina pectoris: Secondary | ICD-10-CM

## 2012-10-05 MED ORDER — CLOPIDOGREL BISULFATE 75 MG PO TABS: 75.0000 mg | ORAL_TABLET | Freq: Every day | ORAL | Status: DC

## 2012-10-05 MED ORDER — CARVEDILOL 12.5 MG PO TABS: 12.5000 mg | ORAL_TABLET | Freq: Two times a day (BID) | ORAL | Status: DC

## 2012-10-05 MED ORDER — ATORVASTATIN CALCIUM 40 MG PO TABS: 40.0000 mg | ORAL_TABLET | Freq: Every day | ORAL | Status: DC

## 2012-11-24 ENCOUNTER — Ambulatory Visit: Payer: Medicaid Other | Admitting: Cardiology

## 2012-11-30 ENCOUNTER — Ambulatory Visit (INDEPENDENT_AMBULATORY_CARE_PROVIDER_SITE_OTHER): Payer: Medicaid Other | Admitting: Cardiology

## 2012-11-30 ENCOUNTER — Encounter: Payer: Self-pay | Admitting: Cardiology

## 2012-11-30 VITALS — BP 130/80 | HR 81 | Ht 71.0 in | Wt 328.0 lb

## 2012-11-30 DIAGNOSIS — F17200 Nicotine dependence, unspecified, uncomplicated: Secondary | ICD-10-CM

## 2012-11-30 DIAGNOSIS — I2510 Atherosclerotic heart disease of native coronary artery without angina pectoris: Secondary | ICD-10-CM

## 2012-11-30 DIAGNOSIS — R002 Palpitations: Secondary | ICD-10-CM

## 2012-11-30 DIAGNOSIS — I2109 ST elevation (STEMI) myocardial infarction involving other coronary artery of anterior wall: Secondary | ICD-10-CM

## 2012-11-30 DIAGNOSIS — Z720 Tobacco use: Secondary | ICD-10-CM

## 2012-11-30 NOTE — Progress Notes (Signed)
HPI The patient presents for follow up of CAD.  Because of chest pain she did have a followup catheterization on 03/02/12 which revealed nonobstructive CAD with patent LAD stent (placed in 09/2011) and previous PTCA site in the ramus. LV function was normal.   The patient has been under considerable stress at home dealing with a disabled child. She has been unable stop smoking. She's not exercising. She had lost significant weight but has gained some of this back. The patient denies any new symptoms such as chest discomfort, neck or arm discomfort. There has been no new shortness of breath, PND or orthopnea. There have been no reported palpitations, presyncope or syncope.I   Allergies  Allergen Reactions  . Aspirin Other (See Comments)    Tachycardia  . Bee Venom Swelling  . Sulfa Drugs Cross Reactors Nausea And Vomiting    Current Outpatient Prescriptions  Medication Sig Dispense Refill  . atorvastatin (LIPITOR) 40 MG tablet Take 1 tablet (40 mg total) by mouth daily.  30 tablet  6  . carvedilol (COREG) 12.5 MG tablet Take 1 tablet (12.5 mg total) by mouth 2 (two) times daily with a meal.  60 tablet  6  . clopidogrel (PLAVIX) 75 MG tablet Take 1 tablet (75 mg total) by mouth daily.  30 tablet  6  . gabapentin (NEURONTIN) 300 MG capsule Take 300 mg by mouth 2 (two) times daily.      . medroxyPROGESTERone (PROVERA) 5 MG tablet Take 5 mg by mouth daily.      . metFORMIN (GLUCOPHAGE) 500 MG tablet Take 1 tablet (500 mg total) by mouth 2 (two) times daily with a meal.      . nitroGLYCERIN (NITROSTAT) 0.4 MG SL tablet Place 0.4 mg under the tongue every 5 (five) minutes x 3 doses as needed. For chest pain       . Skin Protectants, Misc. (EUCERIN) cream Apply 1 application topically as needed. For dry hands        No current facility-administered medications for this visit.    Past Medical History  Diagnosis Date  . Morbid obesity   . Migraines   . Hypertension   . Hyperlipidemia   .  Anxiety   . Coronary artery disease 09/2011    a. Anterior MI. PCI and BMS to LAD and angioplsty to ostial Ramus;  b. 02/2012 Cath patent stent and ptca site, med rx.  . Diabetes mellitus     Past Surgical History  Procedure Laterality Date  . Cholecystectomy    . Cesarean section    . Coronary angioplasty with stent placement  10/08/2011    LAD: BMS, Ramus: cutting balloon angioplasty  . Cardiac catheterization  10/08/2011    LAD: 95% mid, Ramus: 95% ostial    ROS:  As stated in the HPI and negative for all other systems.  PHYSICAL EXAM BP 130/80  Pulse 81  Ht 5\' 11"  (1.803 m)  Wt 328 lb (148.78 kg)  BMI 45.77 kg/m2 GENERAL:  Well appearing HEENT:  Pupils equal round and reactive, fundi not visualized, oral mucosa unremarkable NECK:  No jugular venous distention, waveform within normal limits, carotid upstroke brisk and symmetric, no bruits, no thyromegaly LYMPHATICS:  No cervical, inguinal adenopathy LUNGS:  Clear to auscultation bilaterally BACK:  No CVA tenderness CHEST:  Unremarkable HEART:  PMI not displaced or sustained,S1 and S2 within normal limits, no S3, no S4, no clicks, no rubs, no murmurs ABD:  Flat, positive bowel sounds normal in frequency  in pitch, no bruits, no rebound, no guarding, no midline pulsatile mass, no hepatomegaly, no splenomegaly, obese EXT:  2 plus pulses throughout, no edema, no cyanosis no clubbing, right wrist without bruising or scarring at previous cath site   ASSESSMENT AND PLAN   Coronary artery disease -  The patient has no new sypmtoms.  No further cardiovascular testing is indicated.  We will continue with aggressive risk reduction and meds as listed.  Tobacco abuse -  I again discussed with the patient the need to stop smoking.  She has failed Chantix and nicotine replacement..  Hyperlipidemia  - She is at a target dose of Lipitor. She's had an excellent lipid profile previously. No change in therapy is indicated.  Weight - We  have had multiple discussions about this but this continues to be an ongoing in her life.

## 2012-11-30 NOTE — Patient Instructions (Addendum)

## 2013-02-16 ENCOUNTER — Other Ambulatory Visit: Payer: Self-pay | Admitting: Physician Assistant

## 2013-03-26 ENCOUNTER — Encounter: Payer: Self-pay | Admitting: Cardiovascular Disease

## 2013-03-26 DIAGNOSIS — R079 Chest pain, unspecified: Secondary | ICD-10-CM

## 2013-04-16 ENCOUNTER — Encounter: Payer: Medicaid Other | Admitting: Physician Assistant

## 2013-04-16 NOTE — Progress Notes (Signed)
Primary Cardiologist: Rollene Rotunda, MD   HPI: Post hospital followup from [facility], following recent presentation with CP. We were not formally consulted. Troponins, d-dimer NL. Of note, patient was discharged with diagnosis of costochondritis, with arrangements to follow in our clinic.  Allergies  Allergen Reactions  . Aspirin Other (See Comments)    Tachycardia  . Bee Venom Swelling  . Sulfa Drugs Cross Reactors Nausea And Vomiting    Current Outpatient Prescriptions  Medication Sig Dispense Refill  . atorvastatin (LIPITOR) 40 MG tablet Take 1 tablet (40 mg total) by mouth daily.  30 tablet  6  . carvedilol (COREG) 12.5 MG tablet Take 1 tablet (12.5 mg total) by mouth 2 (two) times daily with a meal.  60 tablet  6  . clopidogrel (PLAVIX) 75 MG tablet Take 1 tablet (75 mg total) by mouth daily.  30 tablet  6  . gabapentin (NEURONTIN) 300 MG capsule Take 300 mg by mouth 2 (two) times daily.      . medroxyPROGESTERone (PROVERA) 5 MG tablet Take 5 mg by mouth daily.      . metFORMIN (GLUCOPHAGE) 500 MG tablet Take 1 tablet (500 mg total) by mouth 2 (two) times daily with a meal.      . NITROSTAT 0.4 MG SL tablet TAKE 1 TABLET UNDER TONGUE AS NEEDED FOR CHEST PAINS EVERY 5 MINS. UP TO THREE TABLETS  25 tablet  2  . Skin Protectants, Misc. (EUCERIN) cream Apply 1 application topically as needed. For dry hands        No current facility-administered medications for this visit.    Past Medical History  Diagnosis Date  . Morbid obesity   . Migraines   . Hypertension   . Hyperlipidemia   . Anxiety   . Coronary artery disease 09/2011    a. Anterior MI. PCI and BMS to LAD and angioplsty to ostial Ramus;  b. 02/2012 Cath patent stent and ptca site, med rx.  . Diabetes mellitus     Past Surgical History  Procedure Laterality Date  . Cholecystectomy    . Cesarean section    . Coronary angioplasty with stent placement  10/08/2011    LAD: BMS, Ramus: cutting balloon angioplasty  . Cardiac  catheterization  10/08/2011    LAD: 95% mid, Ramus: 95% ostial    History   Social History  . Marital Status: Married    Spouse Name: N/A    Number of Children: N/A  . Years of Education: N/A   Occupational History  . Not on file.   Social History Main Topics  . Smoking status: Current Every Day Smoker -- 0.30 packs/day for 20 years    Types: Cigarettes  . Smokeless tobacco: Never Used  . Alcohol Use: No  . Drug Use: No  . Sexually Active: Yes    Birth Control/ Protection: None   Other Topics Concern  . Not on file   Social History Narrative  . No narrative on file    Family History  Problem Relation Age of Onset  . Heart attack Father     ROS: no nausea, vomiting; no fever, chills; no melena, hematochezia; no claudication  PHYSICAL EXAM: There were no vitals taken for this visit. GENERAL: 32 year old female; NAD HEENT: NCAT, PERRLA, EOMI; sclera clear; no xanthelasma NECK: palpable bilateral carotid pulses, no bruits; no JVD; no TM LUNGS: CTA bilaterally CARDIAC: RRR (S1, S2); no significant murmurs; no rubs or gallops ABDOMEN: soft, non-tender; intact BS EXTREMETIES: intact distal pulses;  no significant peripheral edema SKIN: warm/dry; no obvious rash/lesions MUSCULOSKELETAL: no joint deformity NEURO: no focal deficit; NL affect   EKG: reviewed and available in Electronic Records   ASSESSMENT & PLAN:  No problem-specific assessment & plan notes found for this encounter.   Gene Yakelin Grenier, PAC

## 2013-05-11 ENCOUNTER — Encounter: Payer: Medicaid Other | Admitting: Physician Assistant

## 2013-05-11 ENCOUNTER — Encounter: Payer: Self-pay | Admitting: Physician Assistant

## 2013-05-14 ENCOUNTER — Other Ambulatory Visit: Payer: Self-pay | Admitting: Cardiology

## 2013-11-26 ENCOUNTER — Encounter: Payer: Self-pay | Admitting: Cardiology

## 2013-11-26 ENCOUNTER — Ambulatory Visit (INDEPENDENT_AMBULATORY_CARE_PROVIDER_SITE_OTHER): Payer: Medicaid Other | Admitting: Cardiology

## 2013-11-26 ENCOUNTER — Encounter (INDEPENDENT_AMBULATORY_CARE_PROVIDER_SITE_OTHER): Payer: Self-pay

## 2013-11-26 ENCOUNTER — Encounter: Payer: Self-pay | Admitting: *Deleted

## 2013-11-26 VITALS — BP 109/68 | HR 77 | Ht 71.0 in | Wt 329.0 lb

## 2013-11-26 DIAGNOSIS — I2510 Atherosclerotic heart disease of native coronary artery without angina pectoris: Secondary | ICD-10-CM

## 2013-11-26 DIAGNOSIS — R0989 Other specified symptoms and signs involving the circulatory and respiratory systems: Secondary | ICD-10-CM

## 2013-11-26 DIAGNOSIS — R079 Chest pain, unspecified: Secondary | ICD-10-CM

## 2013-11-26 DIAGNOSIS — R0609 Other forms of dyspnea: Secondary | ICD-10-CM

## 2013-11-26 DIAGNOSIS — R0600 Dyspnea, unspecified: Secondary | ICD-10-CM

## 2013-11-26 DIAGNOSIS — R002 Palpitations: Secondary | ICD-10-CM

## 2013-11-26 MED ORDER — LISINOPRIL 2.5 MG PO TABS: 2.5000 mg | ORAL_TABLET | Freq: Every day | ORAL | Status: DC

## 2013-11-26 NOTE — Patient Instructions (Signed)
Your physician recommends that you schedule a follow-up appointment in: 1 month with Dr Wyline Mood  Your physician has requested that you have an echocardiogram. Echocardiography is a painless test that uses sound waves to create images of your heart. It provides your doctor with information about the size and shape of your heart and how well your heart's chambers and valves are working. This procedure takes approximately one hour. There are no restrictions for this procedure.  Your physician has requested that you have a lexiscan myoview. For further information please visit [email or web address] Please follow instruction sheet, as given.  Your physician recommends that you return for lab work in 3 weeks BMET and LIPID PANEL.  Your physician has recommended you make the following change in your medication:  START Lisinopril 2.5 mg daily

## 2013-11-26 NOTE — Progress Notes (Signed)
Clinical Summary Ms. Cardosa is a 33 y.o.female last seen by Dr Antoine PocheHochrein, this is our first visit together. She is seen for the following medical problems.  1. CAD - prior anterior MI 09/2011, 95% lesion in mid LAD and ramus with 95% ostial stenosis. LVEF 55% by LV gram, apical hypokinesis. BMS to mid LAD and cutting balloon angioplasty of ramus.  - repeat cath 02/2012 with patent vessels and stent - 09/2011 Echo: LVEF 50%  - chest pain on Wednesday, stabbing feeling in mid chest to right shoulder. 10/10. Took NG with some benefit. + SOB + Palps + Diaphoresis. Symptoms occurred at rest.  - negative EKG and cardiac enzymes. CT PE negative.  - diagnosed with pneumonia, completed abx. Symptoms resolving.  - has noted some progressive  DOE over the last few months as well  2. HTN - compliant with medications  3. Hyperlipidemia - no recent panel in our system - compliant with statin   Past Medical History  Diagnosis Date  . Morbid obesity   . Migraines   . Hypertension   . Hyperlipidemia   . Anxiety   . Coronary artery disease 09/2011    a. Anterior MI. PCI and BMS to LAD and angioplsty to ostial Ramus;  b. 02/2012 Cath patent stent and ptca site, med rx.  . Diabetes mellitus      Allergies  Allergen Reactions  . Aspirin Other (See Comments)    Tachycardia  . Bee Venom Swelling  . Sulfa Drugs Cross Reactors Nausea And Vomiting     Current Outpatient Prescriptions  Medication Sig Dispense Refill  . atorvastatin (LIPITOR) 40 MG tablet TAKE 1 TABLET BY MOUTH EVERY DAY  30 tablet  6  . carvedilol (COREG) 12.5 MG tablet TAKE 1 TABLET BY MOUTH TWICE DAILY WITH A MEAL.  60 tablet  6  . clopidogrel (PLAVIX) 75 MG tablet TAKE 1 TABLET BY MOUTH EVERY DAY  30 tablet  6  . gabapentin (NEURONTIN) 300 MG capsule Take 300 mg by mouth 2 (two) times daily.      . medroxyPROGESTERone (PROVERA) 5 MG tablet Take 5 mg by mouth daily.      . metFORMIN (GLUCOPHAGE) 500 MG tablet Take 1 tablet  (500 mg total) by mouth 2 (two) times daily with a meal.      . NITROSTAT 0.4 MG SL tablet TAKE 1 TABLET UNDER TONGUE AS NEEDED FOR CHEST PAINS EVERY 5 MINS. UP TO THREE TABLETS  25 tablet  2  . Skin Protectants, Misc. (EUCERIN) cream Apply 1 application topically as needed. For dry hands        No current facility-administered medications for this visit.     Past Surgical History  Procedure Laterality Date  . Cholecystectomy    . Cesarean section    . Coronary angioplasty with stent placement  10/08/2011    LAD: BMS, Ramus: cutting balloon angioplasty  . Cardiac catheterization  10/08/2011    LAD: 95% mid, Ramus: 95% ostial     Allergies  Allergen Reactions  . Aspirin Other (See Comments)    Tachycardia  . Bee Venom Swelling  . Sulfa Drugs Cross Reactors Nausea And Vomiting      Family History  Problem Relation Age of Onset  . Heart attack Father      Social History Ms. Betzer reports that she has been smoking Cigarettes.  She has a 6 pack-year smoking history. She has never used smokeless tobacco. Ms. Raymundo reports that she  does not drink alcohol.   Review of Systems CONSTITUTIONAL: No weight loss, fever, chills, weakness or fatigue.  HEENT: Eyes: No visual loss, blurred vision, double vision or yellow sclerae.No hearing loss, sneezing, congestion, runny nose or sore throat.  SKIN: No rash or itching.  CARDIOVASCULAR: per HPI RESPIRATORY: SOB GASTROINTESTINAL: No anorexia, nausea, vomiting or diarrhea. No abdominal pain or blood.  GENITOURINARY: No burning on urination, no polyuria NEUROLOGICAL: No headache, dizziness, syncope, paralysis, ataxia, numbness or tingling in the extremities. No change in bowel or bladder control.  MUSCULOSKELETAL: No muscle, back pain, joint pain or stiffness.  LYMPHATICS: No enlarged nodes. No history of splenectomy.  PSYCHIATRIC: No history of depression or anxiety.  ENDOCRINOLOGIC: No reports of sweating, cold or heat intolerance. No  polyuria or polydipsia.  Marland Kitchen   Physical Examination p 77 bp 109/68 Wt 329 lbs BMI 46 Gen: resting comfortably, no acute distress HEENT: no scleral icterus, pupils equal round and reactive, no palptable cervical adenopathy,  CV: RRR, no m/r/g, no JVD, no carotid bruits Resp: Clear to auscultation bilaterally GI: abdomen is soft, non-tender, non-distended, normal bowel sounds, no hepatosplenomegaly MSK: extremities are warm, no edema.  Skin: warm, no rash Neuro:  no focal deficits Psych: appropriate affect   Diagnostic Studies 02/2012 Cath Hemodynamic Findings:  Central aortic pressure: 108/70  Left ventricular pressure: 123/10/21  Angiographic Findings:  Left main: No obstructive disease noted.  Left Anterior Descending Artery: Large caliber vessel that courses to the apex. There is a stent present in the mid vessel that is widely patent with no restenosis. The remainder of the LAD is disease free. There is a moderate sized diagonal Betul Brisky with 30% proximal stenosis.  Circumflex Artery: Dominant, large caliber vessel with no disease throughout the AV groove segment. There is small to moderate sized intermediate Ajay Strubel that has ostial 30% stenosis. The remainder of the Circumflex has no obstructive disease.  Right Coronary Artery: Small, non-dominant vessel. No disease noted.  Left Ventricular Angiogram: LVEF 50-55%.  Impression:  1. Double vessel CAD with patent stent mid LAD and patent angioplasty site at ostium of intermediate Joanthony Hamza.  2. Normal LV systolic function    09/2011 Cath PROCEDURAL FINDINGS  Hemodynamics:  AO 135/92 with a mean of 114 mmHg  LV 136/25 mmHg  Coronary angiography:  Coronary dominance: Left  Left mainstem: Normal.  Left anterior descending (LAD): There is a 95% stenosis in the mid LAD immediately after the takeoff of the first diagonal. The first diagonal Sabel Hornbeck is moderate in size and appears normal. The LAD stenosis does appear to be hazy.  There is a  moderately large ramus intermediate Tasmine Hipwell which has a 95% ostial stenosis.  Left circumflex (LCx): This is a dominant vessel and appears normal throughout.  Right coronary artery (RCA): This is a small nondominant vessel and is normal.  Left ventriculography: Left ventricular systolic function is abnormal with apical hypokinesis. LVEF is estimated at 55%, there is no significant mitral regurgitation  PCI Note: Following the diagnostic procedure, the decision was made to proceed with PCI of the LAD. Weight-based bivalirudin was given for anticoagulation. Once a therapeutic ACT was achieved, a 5 [place] EBU guide catheter was inserted. A pro-water coronary guidewire was used to cross the lesion. The lesion was predilated with a 2.5 mm balloon. The lesion was then stented with a 3.0 x 18 mm vision stent. The stent was postdilated with a 3.25 mm noncompliant balloon. Following PCI, there was 0% residual stenosis and TIMI-3 flow. At  this point the patient's pain was improved but was Sole of moderate intensity. We then proceeded to intervene on the ostial ramus intermediate lesion. This was crossed with the prowater wire. This was dilated using a 3.0 x 10 mm cutting balloon performing 3 inflations to 6 atmospheres. This showed a good angiographic result with less than 30% residual stenosis and TIMI grade 3 flow. Final angiography confirmed an excellent result. The patient tolerated the procedure well. There were no immediate procedural complications. A TR band was used for radial hemostasis. The patient was transferred to the post catheterization recovery area for further monitoring.  PCI Data:  Vessel - LAD/Segment - mid vessel immediately after the takeoff of the first diagonal.  Percent Stenosis (pre) 95%  TIMI-flow 3.  Stent 3.0 x 18 mm vision stent.  Percent Stenosis (post) 0%  TIMI-flow (post) 3.  Second vessel: Ostial ramus intermediate Yemaya Barnier  Percent stenosis: 95%.  TIMI flow 3.  3.0 mm cutting  balloon  Percent stenosis (post) less than 30%  TIMI flow 3  Final Conclusions:  1. Severe 2 vessel obstructive coronary disease.  2. Overall well preserved LV systolic function with apical wall motion abnormality and ejection fraction of 55%.  3. Successful intracoronary stenting of the mid LAD with a bare-metal stent.  4. Successful cutting balloon angioplasty of the ramus intermediate Vishwa Dais.  Recommendations:  Continue therapy with Plavix. Patient reports a history of allergy to aspirin. Aggressive risk factor modification.  Disposition: Patient be observed in the intensive care unit tonight. If she has no complications she should be able to be transferred to telemetry in the morning.     Assessment and Plan  1. CAD/Chest pain - recent episode of chest pain along with progressing DOE - given history of CAD, will obtain lexiscan to evaluate for ischemia - obtain echo in setting of progressing DOE  2. HTN - continue current meds, start low dose ACE-I in setting of CAD and DM. Some question if previously causes a cough or not in past, will follow symptoms.   3. Hyperlipidemia - obtain fasting lipid panel - continue current statin for now  Follow up 1 month      Antoine Poche, M.D., F.A.C.C.

## 2013-11-30 ENCOUNTER — Ambulatory Visit (HOSPITAL_COMMUNITY): Payer: Medicaid Other

## 2013-12-05 ENCOUNTER — Encounter (HOSPITAL_COMMUNITY): Payer: Medicaid Other

## 2013-12-05 ENCOUNTER — Other Ambulatory Visit (HOSPITAL_COMMUNITY): Payer: Medicaid Other

## 2013-12-10 ENCOUNTER — Ambulatory Visit (HOSPITAL_COMMUNITY)
Admission: RE | Admit: 2013-12-10 | Discharge: 2013-12-10 | Disposition: A | Discharge status: Home / Self Care | Payer: Medicaid Other | Source: Ambulatory Visit | Attending: Cardiology | Admitting: Cardiology

## 2013-12-10 DIAGNOSIS — R002 Palpitations: Secondary | ICD-10-CM | POA: Insufficient documentation

## 2013-12-10 DIAGNOSIS — R0600 Dyspnea, unspecified: Secondary | ICD-10-CM

## 2013-12-10 DIAGNOSIS — R0989 Other specified symptoms and signs involving the circulatory and respiratory systems: Secondary | ICD-10-CM | POA: Insufficient documentation

## 2013-12-10 DIAGNOSIS — E119 Type 2 diabetes mellitus without complications: Secondary | ICD-10-CM | POA: Insufficient documentation

## 2013-12-10 DIAGNOSIS — Z9861 Coronary angioplasty status: Secondary | ICD-10-CM | POA: Insufficient documentation

## 2013-12-10 DIAGNOSIS — E785 Hyperlipidemia, unspecified: Secondary | ICD-10-CM | POA: Insufficient documentation

## 2013-12-10 DIAGNOSIS — I2510 Atherosclerotic heart disease of native coronary artery without angina pectoris: Secondary | ICD-10-CM | POA: Insufficient documentation

## 2013-12-10 DIAGNOSIS — E669 Obesity, unspecified: Secondary | ICD-10-CM | POA: Insufficient documentation

## 2013-12-10 DIAGNOSIS — I079 Rheumatic tricuspid valve disease, unspecified: Secondary | ICD-10-CM | POA: Insufficient documentation

## 2013-12-10 DIAGNOSIS — I10 Essential (primary) hypertension: Secondary | ICD-10-CM | POA: Insufficient documentation

## 2013-12-10 DIAGNOSIS — R0609 Other forms of dyspnea: Secondary | ICD-10-CM

## 2013-12-10 DIAGNOSIS — Z6841 Body Mass Index (BMI) 40.0 and over, adult: Secondary | ICD-10-CM | POA: Insufficient documentation

## 2013-12-10 NOTE — Progress Notes (Signed)
*  PRELIMINARY RESULTS* Echocardiogram 2D Echocardiogram has been performed.  Jacier Gladu 12/10/2013, 3:10 PM

## 2013-12-11 ENCOUNTER — Encounter (HOSPITAL_COMMUNITY): Admission: RE | Admit: 2013-12-11 | Payer: Medicaid Other | Source: Ambulatory Visit

## 2013-12-11 ENCOUNTER — Ambulatory Visit (HOSPITAL_COMMUNITY): Admission: RE | Admit: 2013-12-11 | Payer: Medicaid Other | Source: Ambulatory Visit

## 2013-12-11 ENCOUNTER — Encounter (HOSPITAL_COMMUNITY): Payer: Medicaid Other

## 2013-12-17 ENCOUNTER — Telehealth: Payer: Self-pay

## 2013-12-17 ENCOUNTER — Encounter (HOSPITAL_COMMUNITY): Payer: Medicaid Other

## 2013-12-17 ENCOUNTER — Encounter (HOSPITAL_COMMUNITY): Admission: RE | Admit: 2013-12-17 | Payer: Medicaid Other | Source: Ambulatory Visit

## 2013-12-17 NOTE — Telephone Encounter (Signed)
No show for myoview x 2

## 2013-12-17 NOTE — Telephone Encounter (Signed)
Message copied by Nori Riis on Mon Dec 17, 2013 10:47 AM ------      Message from: Ashley Akin      Created: Mon Dec 17, 2013 10:28 AM      Regarding: No Show for Myoview       Patient has no showed twice now for myoview.            Thanks,      Aurther Loft ------

## 2013-12-17 NOTE — Telephone Encounter (Signed)
Pt read on internet the test can have "more problems than it is worth" ie heart attack She wants to go see a pulmonologist to see why she is not "getting enough oxygen"

## 2013-12-17 NOTE — Telephone Encounter (Signed)
Message copied by CARLTON, CATHERINE A on Mon Dec 17, 2013 10:47 AM ------      Message from: STEWART, TERRY G      Created: Mon Dec 17, 2013 10:28 AM      Regarding: No Show for Myoview       Patient has no showed twice now for myoview.            Thanks,      Terry ------ 

## 2013-12-17 NOTE — Telephone Encounter (Signed)
She has a history of previous blockages and stents. There is a good chance that her recent symptoms are due to new blockages. This stress test is the safer way to evaluate for that, I continue to recommend that for her.  Dina Rich MD

## 2013-12-17 NOTE — Telephone Encounter (Signed)
Left message to call back  

## 2013-12-18 ENCOUNTER — Telehealth: Payer: Self-pay

## 2013-12-18 ENCOUNTER — Ambulatory Visit: Payer: Medicaid Other | Admitting: Cardiology

## 2013-12-18 NOTE — Telephone Encounter (Signed)
Patient states she wants to have lexi scan in 2 weeks,have referred to front desk to schedule apt

## 2013-12-18 NOTE — Telephone Encounter (Signed)
Pt states she wants to re-schedule lexi scan for 2 weeks from today,sent to receptionist to schedule

## 2013-12-31 ENCOUNTER — Encounter (HOSPITAL_COMMUNITY)
Admission: RE | Admit: 2013-12-31 | Discharge: 2013-12-31 | Disposition: A | Discharge status: Home / Self Care | Payer: Medicaid Other | Source: Ambulatory Visit | Attending: Cardiology | Admitting: Cardiology

## 2013-12-31 ENCOUNTER — Encounter (HOSPITAL_COMMUNITY): Payer: Self-pay

## 2013-12-31 DIAGNOSIS — R0789 Other chest pain: Secondary | ICD-10-CM

## 2013-12-31 DIAGNOSIS — R079 Chest pain, unspecified: Secondary | ICD-10-CM

## 2013-12-31 DIAGNOSIS — I2510 Atherosclerotic heart disease of native coronary artery without angina pectoris: Secondary | ICD-10-CM

## 2013-12-31 DIAGNOSIS — R9439 Abnormal result of other cardiovascular function study: Secondary | ICD-10-CM | POA: Insufficient documentation

## 2013-12-31 MED ORDER — REGADENOSON 0.4 MG/5ML IV SOLN
INTRAVENOUS | Status: AC
  Administered 2013-12-31: 0.4 mg via INTRAVENOUS
  Filled 2013-12-31: qty 5

## 2013-12-31 MED ORDER — SODIUM CHLORIDE 0.9 % IJ SOLN
INTRAMUSCULAR | Status: AC
  Administered 2013-12-31: 10 mL via INTRAVENOUS
  Filled 2013-12-31: qty 10

## 2013-12-31 MED ORDER — TECHNETIUM TC 99M SESTAMIBI GENERIC - CARDIOLITE
10.0000 | Freq: Once | INTRAVENOUS | Status: AC | PRN
  Administered 2013-12-31: 10 via INTRAVENOUS

## 2013-12-31 MED ORDER — TECHNETIUM TC 99M SESTAMIBI - CARDIOLITE
30.0000 | Freq: Once | INTRAVENOUS | Status: AC | PRN
  Administered 2013-12-31: 30 via INTRAVENOUS

## 2013-12-31 NOTE — Progress Notes (Signed)
.  Stress Lab Nurses Notes - [facility]  [name] 12/31/2013 Reason for doing test: Chest Pain and Dyspnea Type of test: Lexiscan  Cardiolite Nurse performing test: Fareeda Downard Billingsly, RN Nuclear Medicine Tech: Miranda Womack Echo Tech: Not Applicable MD performing test: Branch Family MD: Fanta Test explained and consent signed: yes IV started: 22g jelco, Saline lock flushed, No redness or edema and Saline lock started in radiology Symptoms: nausea Treatment/Intervention: None Reason test stopped: protocol completed After recovery IV was: Discontinued via X-ray tech and No redness or edema Patient to return to Nuc. Med at : 11:15 Patient discharged: Home Patient's Condition upon discharge was: stable Comments: During test BP 110/60 & HR 102.  Recovery BP 108/58 & HR 82.  Symptoms resolved in recovery. [name] 

## 2014-01-02 ENCOUNTER — Telehealth: Payer: Self-pay | Admitting: *Deleted

## 2014-01-02 ENCOUNTER — Encounter: Payer: Medicaid Other | Admitting: Cardiology

## 2014-01-02 ENCOUNTER — Encounter: Payer: Self-pay | Admitting: Cardiology

## 2014-01-02 DIAGNOSIS — R9439 Abnormal result of other cardiovascular function study: Secondary | ICD-10-CM

## 2014-01-02 DIAGNOSIS — R079 Chest pain, unspecified: Secondary | ICD-10-CM

## 2014-01-02 DIAGNOSIS — Z01812 Encounter for preprocedural laboratory examination: Secondary | ICD-10-CM

## 2014-01-02 NOTE — Telephone Encounter (Signed)
Please advise and I will call pt with results.

## 2014-01-02 NOTE — Progress Notes (Signed)
Clinical Summary [name] is a 33 y.o.female seen today for follow up of the following medical problems.   1. CAD  - prior anterior MI 09/2011, 95% lesion in mid LAD and ramus with 95% ostial stenosis. LVEF 55% by LV gram, apical hypokinesis. BMS to mid LAD and cutting balloon angioplasty of ramus.  - repeat cath 02/2012 with patent vessels and stent  - 09/2011 Echo: LVEF 50%  - chest pain on Wednesday, stabbing feeling in mid chest to right shoulder. 10/10. Took NG with some benefit. + SOB + Palps + Diaphoresis. Symptoms occurred at rest.  - negative EKG and cardiac enzymes. CT PE negative.  - diagnosed with pneumonia, completed abx. Symptoms resolving.  - has noted some progressive DOE over the last few months as well   2. HTN  - compliant with medications   3. Hyperlipidemia  - no recent panel in our system  - compliant with statin     Past Medical History  Diagnosis Date  . Morbid obesity   . Migraines   . Hypertension   . Hyperlipidemia   . Anxiety   . Coronary artery disease 09/2011    a. Anterior MI. PCI and BMS to LAD and angioplsty to ostial Ramus;  b. 02/2012 Cath patent stent and ptca site, med rx.  . Diabetes mellitus      Allergies  Allergen Reactions  . Bee Venom Swelling  . Sulfa Drugs Cross Reactors Nausea And Vomiting     Current Outpatient Prescriptions  Medication Sig Dispense Refill  . aspirin 81 MG tablet Take 81 mg by mouth daily.      Marland Kitchen atorvastatin (LIPITOR) 40 MG tablet TAKE 1 TABLET BY MOUTH EVERY DAY  30 tablet  6  . carvedilol (COREG) 12.5 MG tablet TAKE 1 TABLET BY MOUTH TWICE DAILY WITH A MEAL.  60 tablet  6  . clopidogrel (PLAVIX) 75 MG tablet TAKE 1 TABLET BY MOUTH EVERY DAY  30 tablet  6  . gabapentin (NEURONTIN) 300 MG capsule Take 300 mg by mouth 2 (two) times daily.      Marland Kitchen lisinopril (PRINIVIL,ZESTRIL) 2.5 MG tablet Take 1 tablet (2.5 mg total) by mouth daily.  30 tablet  6  . medroxyPROGESTERone (PROVERA) 5 MG tablet Take 5 mg  by mouth daily.      . metFORMIN (GLUCOPHAGE) 500 MG tablet Take 1 tablet (500 mg total) by mouth 2 (two) times daily with a meal.      . NITROSTAT 0.4 MG SL tablet TAKE 1 TABLET UNDER TONGUE AS NEEDED FOR CHEST PAINS EVERY 5 MINS. UP TO THREE TABLETS  25 tablet  2  . Skin Protectants, Misc. (EUCERIN) cream Apply 1 application topically as needed. For dry hands        No current facility-administered medications for this visit.     Past Surgical History  Procedure Laterality Date  . Cholecystectomy    . Cesarean section    . Coronary angioplasty with stent placement  10/08/2011    LAD: BMS, Ramus: cutting balloon angioplasty  . Cardiac catheterization  10/08/2011    LAD: 95% mid, Ramus: 95% ostial     Allergies  Allergen Reactions  . Bee Venom Swelling  . Sulfa Drugs Cross Reactors Nausea And Vomiting      Family History  Problem Relation Age of Onset  . Heart attack Father      Social History [name] reports that she has been smoking Cigarettes.  She  has a 6 pack-year smoking history. She has never used smokeless tobacco. [name] reports that she does not drink alcohol.   Review of Systems CONSTITUTIONAL: No weight loss, fever, chills, weakness or fatigue.  HEENT: Eyes: No visual loss, blurred vision, double vision or yellow sclerae.No hearing loss, sneezing, congestion, runny nose or sore throat.  SKIN: No rash or itching.  CARDIOVASCULAR:  RESPIRATORY: No shortness of breath, cough or sputum.  GASTROINTESTINAL: No anorexia, nausea, vomiting or diarrhea. No abdominal pain or blood.  GENITOURINARY: No burning on urination, no polyuria NEUROLOGICAL: No headache, dizziness, syncope, paralysis, ataxia, numbness or tingling in the extremities. No change in bowel or bladder control.  MUSCULOSKELETAL: No muscle, back pain, joint pain or stiffness.  LYMPHATICS: No enlarged nodes. No history of splenectomy.  PSYCHIATRIC: No history of depression or anxiety.    ENDOCRINOLOGIC: No reports of sweating, cold or heat intolerance. No polyuria or polydipsia.  Marland Kitchen   Physical Examination There were no vitals filed for this visit. There were no vitals filed for this visit.  Gen: resting comfortably, no acute distress HEENT: no scleral icterus, pupils equal round and reactive, no palptable cervical adenopathy,  CV Resp: Clear to auscultation bilaterally GI: abdomen is soft, non-tender, non-distended, normal bowel sounds, no hepatosplenomegaly MSK: extremities are warm, no edema.  Skin: warm, no rash Neuro:  no focal deficits Psych: appropriate affect   Diagnostic Studies 02/2012 Cath  Hemodynamic Findings:  Central aortic pressure: 108/70  Left ventricular pressure: 123/10/21  Angiographic Findings:  Left main: No obstructive disease noted.  Left Anterior Descending Artery: Large caliber vessel that courses to the apex. There is a stent present in the mid vessel that is widely patent with no restenosis. The remainder of the LAD is disease free. There is a moderate sized diagonal branch with 30% proximal stenosis.  Circumflex Artery: Dominant, large caliber vessel with no disease throughout the AV groove segment. There is small to moderate sized intermediate branch that has ostial 30% stenosis. The remainder of the Circumflex has no obstructive disease.  Right Coronary Artery: Small, non-dominant vessel. No disease noted.  Left Ventricular Angiogram: LVEF 50-55%.  Impression:  1. Double vessel CAD with patent stent mid LAD and patent angioplasty site at ostium of intermediate branch.  2. Normal LV systolic function    09/2011 Cath  PROCEDURAL FINDINGS  Hemodynamics:  AO 135/92 with a mean of 114 mmHg  LV 136/25 mmHg  Coronary angiography:  Coronary dominance: Left  Left mainstem: Normal.  Left anterior descending (LAD): There is a 95% stenosis in the mid LAD immediately after the takeoff of the first diagonal. The first diagonal branch is  moderate in size and appears normal. The LAD stenosis does appear to be hazy.  There is a moderately large ramus intermediate branch which has a 95% ostial stenosis.  Left circumflex (LCx): This is a dominant vessel and appears normal throughout.  Right coronary artery (RCA): This is a small nondominant vessel and is normal.  Left ventriculography: Left ventricular systolic function is abnormal with apical hypokinesis. LVEF is estimated at 55%, there is no significant mitral regurgitation  PCI Note: Following the diagnostic procedure, the decision was made to proceed with PCI of the LAD. Weight-based bivalirudin was given for anticoagulation. Once a therapeutic ACT was achieved, a 5 [place] EBU guide catheter was inserted. A pro-water coronary guidewire was used to cross the lesion. The lesion was predilated with a 2.5 mm balloon. The lesion was then stented with a 3.0  x 18 mm vision stent. The stent was postdilated with a 3.25 mm noncompliant balloon. Following PCI, there was 0% residual stenosis and TIMI-3 flow. At this point the patient's pain was improved but was [place] of moderate intensity. We then proceeded to intervene on the ostial ramus intermediate lesion. This was crossed with the prowater wire. This was dilated using a 3.0 x 10 mm cutting balloon performing 3 inflations to 6 atmospheres. This showed a good angiographic result with less than 30% residual stenosis and TIMI grade 3 flow. Final angiography confirmed an excellent result. The patient tolerated the procedure well. There were no immediate procedural complications. A TR band was used for radial hemostasis. The patient was transferred to the post catheterization recovery area for further monitoring.  PCI Data:  Vessel - LAD/Segment - mid vessel immediately after the takeoff of the first diagonal.  Percent Stenosis (pre) 95%  TIMI-flow 3.  Stent 3.0 x 18 mm vision stent.  Percent Stenosis (post) 0%  TIMI-flow (post) 3.  Second vessel:  Ostial ramus intermediate branch  Percent stenosis: 95%.  TIMI flow 3.  3.0 mm cutting balloon  Percent stenosis (post) less than 30%  TIMI flow 3  Final Conclusions:  1. Severe 2 vessel obstructive coronary disease.  2. Overall well preserved LV systolic function with apical wall motion abnormality and ejection fraction of 55%.  3. Successful intracoronary stenting of the mid LAD with a bare-metal stent.  4. Successful cutting balloon angioplasty of the ramus intermediate branch.  Recommendations:  Continue therapy with Plavix. Patient reports a history of allergy to aspirin. Aggressive risk factor modification.  Disposition: Patient be observed in the intensive care unit tonight. If she has no complications she should be able to be transferred to telemetry in the morning.     Assessment and Plan   1. CAD/Chest pain  - recent episode of chest pain along with progressing DOE  - given history of CAD, will obtain lexiscan to evaluate for ischemia  - obtain echo in setting of progressing DOE   2. HTN  - continue current meds, start low dose ACE-I in setting of CAD and DM. Some question if previously causes a cough or not in past, will follow symptoms.   3. Hyperlipidemia  - obtain fasting lipid panel  - continue current statin for now      Antoine PocheJonathan F. Branch, M.D., F.A.C.C.

## 2014-01-02 NOTE — Telephone Encounter (Signed)
Pt was on schedule for today to go over stress test results. Please call patient with result.

## 2014-01-03 ENCOUNTER — Other Ambulatory Visit: Payer: Self-pay | Admitting: Cardiology

## 2014-01-03 ENCOUNTER — Encounter (HOSPITAL_COMMUNITY): Payer: Self-pay | Admitting: Pharmacy Technician

## 2014-01-03 DIAGNOSIS — R079 Chest pain, unspecified: Secondary | ICD-10-CM

## 2014-01-03 NOTE — Telephone Encounter (Signed)
Spoke with pt, pt is made aware of cath tomorrow morning at 12 pm with Dr Tresa Endo. Labs to be drawn at [facility] cone before procedure. Pt will be at [facility] at 10 am.  Told pt to hold metformin the night before and morning of procedure and NPO after midnight.

## 2014-01-03 NOTE — Telephone Encounter (Signed)
Spoke with patient about abnormal stress test. She needs a left heart cath with coronary angiography for chest pain and abnormal stress test. Please arrange this for her.    Dina Rich MD

## 2014-01-04 ENCOUNTER — Encounter (HOSPITAL_COMMUNITY)
Admission: RE | Disposition: A | Discharge status: Home / Self Care | Payer: Self-pay | Source: Ambulatory Visit | Attending: Cardiovascular Disease

## 2014-01-04 ENCOUNTER — Ambulatory Visit (HOSPITAL_COMMUNITY)
Admission: RE | Admit: 2014-01-04 | Discharge: 2014-01-04 | Disposition: A | Discharge status: Home / Self Care | Payer: Medicaid Other | Source: Ambulatory Visit | Attending: Cardiovascular Disease | Admitting: Cardiovascular Disease

## 2014-01-04 DIAGNOSIS — I252 Old myocardial infarction: Secondary | ICD-10-CM | POA: Insufficient documentation

## 2014-01-04 DIAGNOSIS — E785 Hyperlipidemia, unspecified: Secondary | ICD-10-CM

## 2014-01-04 DIAGNOSIS — R079 Chest pain, unspecified: Secondary | ICD-10-CM

## 2014-01-04 DIAGNOSIS — Z9861 Coronary angioplasty status: Secondary | ICD-10-CM | POA: Insufficient documentation

## 2014-01-04 DIAGNOSIS — I059 Rheumatic mitral valve disease, unspecified: Secondary | ICD-10-CM | POA: Insufficient documentation

## 2014-01-04 DIAGNOSIS — Z720 Tobacco use: Secondary | ICD-10-CM

## 2014-01-04 DIAGNOSIS — E669 Obesity, unspecified: Secondary | ICD-10-CM | POA: Insufficient documentation

## 2014-01-04 DIAGNOSIS — I2510 Atherosclerotic heart disease of native coronary artery without angina pectoris: Secondary | ICD-10-CM

## 2014-01-04 HISTORY — PX: LEFT HEART CATHETERIZATION WITH CORONARY ANGIOGRAM: SHX5451

## 2014-01-04 LAB — BASIC METABOLIC PANEL
BUN: 13 mg/dL (ref 6–23)
CO2: 19 mEq/L (ref 19–32)
Calcium: 9.2 mg/dL (ref 8.4–10.5)
Chloride: 103 mEq/L (ref 96–112)
Creatinine, Ser: 0.59 mg/dL (ref 0.50–1.10)
GFR calc Af Amer: >90 mL/min (ref >90)
GFR calc non Af Amer: >90 mL/min (ref >90)
Glucose, Bld: 101 mg/dL — ABNORMAL HIGH (ref 70–99)
Potassium: 4 mEq/L (ref 3.7–5.3)
Sodium: 138 mEq/L (ref 137–147)

## 2014-01-04 LAB — CBC
HCT: 38.2 % (ref 36.0–46.0)
Hemoglobin: 13.7 g/dL (ref 12.0–15.0)
MCH: 30.9 pg (ref 26.0–34.0)
MCHC: 35.9 g/dL (ref 30.0–36.0)
MCV: 86 fL (ref 78.0–100.0)
Platelets: 231 10*3/uL (ref 150–400)
RBC: 4.44 MIL/uL (ref 3.87–5.11)
RDW: 12.8 % (ref 11.5–15.5)
WBC: 9.4 10*3/uL (ref 4.0–10.5)

## 2014-01-04 LAB — HCG, SERUM, QUALITATIVE: Preg, Serum: NEGATIVE

## 2014-01-04 LAB — PROTIME-INR
INR: 0.96 (ref 0.00–1.49)
Prothrombin Time: 12.6 seconds (ref 11.6–15.2)

## 2014-01-04 SURGERY — LEFT HEART CATHETERIZATION WITH CORONARY ANGIOGRAM
Anesthesia: LOCAL

## 2014-01-04 MED ORDER — SODIUM CHLORIDE 0.9 % IJ SOLN: 3.0000 mL | Freq: Two times a day (BID) | INTRAMUSCULAR | Status: DC

## 2014-01-04 MED ORDER — ASPIRIN 81 MG PO CHEW
81.0000 mg | CHEWABLE_TABLET | ORAL | Status: AC
  Administered 2014-01-04: 81 mg via ORAL
  Filled 2014-01-04: qty 1

## 2014-01-04 MED ORDER — VERAPAMIL HCL 2.5 MG/ML IV SOLN
INTRAVENOUS | Status: AC
  Filled 2014-01-04: qty 2

## 2014-01-04 MED ORDER — SODIUM CHLORIDE 0.9 % IV SOLN: INTRAVENOUS | Status: DC

## 2014-01-04 MED ORDER — HEPARIN SODIUM (PORCINE) 1000 UNIT/ML IJ SOLN
INTRAMUSCULAR | Status: AC
  Filled 2014-01-04: qty 1

## 2014-01-04 MED ORDER — HEPARIN (PORCINE) IN NACL 2-0.9 UNIT/ML-% IJ SOLN
INTRAMUSCULAR | Status: AC
  Filled 2014-01-04: qty 1500

## 2014-01-04 MED ORDER — NITROGLYCERIN 0.2 MG/ML ON CALL CATH LAB
INTRAVENOUS | Status: AC
  Filled 2014-01-04: qty 1

## 2014-01-04 MED ORDER — LIDOCAINE HCL (PF) 1 % IJ SOLN
INTRAMUSCULAR | Status: AC
  Filled 2014-01-04: qty 30

## 2014-01-04 MED ORDER — MIDAZOLAM HCL 2 MG/2ML IJ SOLN
INTRAMUSCULAR | Status: AC
  Filled 2014-01-04: qty 2

## 2014-01-04 MED ORDER — FENTANYL CITRATE 0.05 MG/ML IJ SOLN
INTRAMUSCULAR | Status: AC
  Filled 2014-01-04: qty 2

## 2014-01-04 MED ORDER — SODIUM CHLORIDE 0.9 % IJ SOLN: 3.0000 mL | INTRAMUSCULAR | Status: DC | PRN

## 2014-01-04 MED ORDER — SODIUM CHLORIDE 0.9 % IV SOLN: 250.0000 mL | INTRAVENOUS | Status: DC | PRN

## 2014-01-04 NOTE — Interval H&P Note (Signed)
History and Physical Interval Note:  01/04/2014 2:53 PM  [name]  has presented today for surgery, with the diagnosis of chest pain   The various methods of treatment have been discussed with the patient and family. After consideration of risks, benefits and other options for treatment, the patient has consented to  Procedure(s): LEFT HEART CATHETERIZATION WITH CORONARY ANGIOGRAM (N/A) as a surgical intervention .  The patient's history has been reviewed, patient examined, no change in status, stable for surgery.  I have reviewed the patient's chart and labs.  Questions were answered to the patient's satisfaction.     Lennette Bihari

## 2014-01-04 NOTE — H&P (View-Only) (Signed)
.  Stress Lab Nurses Notes - Karuna Robie D Proctor 12/31/2013 Reason for doing test: Chest Pain and Dyspnea Type of test: Marlane Hatcher Nurse performing test: Parke Poisson, RN Nuclear Medicine Tech: Marcella Dubs Echo Tech: Not Applicable MD performing test: Nilda Riggs MD: Fanta Test explained and consent signed: yes IV started: 22g jelco, Saline lock flushed, No redness or edema and Saline lock started in radiology Symptoms: nausea Treatment/Intervention: None Reason test stopped: protocol completed After recovery IV was: Discontinued via X-ray tech and No redness or edema Patient to return to Nuc. Med at : 11:15 Patient discharged: Home Patient's Condition upon discharge was: stable Comments: During test BP 110/60 & HR 102.  Recovery BP 108/58 & HR 82.  Symptoms resolved in recovery. Erskine Speed T

## 2014-01-04 NOTE — Discharge Instructions (Signed)

## 2014-01-04 NOTE — Interval H&P Note (Signed)
Cath Lab Visit (complete for each Cath Lab visit)  Clinical Evaluation Leading to the Procedure:   ACS: no  Non-ACS:    Anginal Classification: CCS III  Anti-ischemic medical therapy: Maximal Therapy (2 or more classes of medications)  Non-Invasive Test Results: High-risk stress test findings: cardiac mortality >3%/year  Prior CABG: No previous CABG      History and Physical Interval Note:  01/04/2014 3:16 PM  [name]  has presented today for surgery, with the diagnosis of chest pain   The various methods of treatment have been discussed with the patient and family. After consideration of risks, benefits and other options for treatment, the patient has consented to  Procedure(s): LEFT HEART CATHETERIZATION WITH CORONARY ANGIOGRAM (N/A) as a surgical intervention .  The patient's history has been reviewed, patient examined, no change in status, stable for surgery.  I have reviewed the patient's chart and labs.  Questions were answered to the patient's satisfaction.     Lennette Bihari

## 2014-01-04 NOTE — CV Procedure (Signed)
[name] is a 33 y.o. female    [id number]  [id number] LOCATION:  FACILITY: [facility]  PHYSICIAN: Lennette Bihari, MD, [facility] 08-01-1981   DATE OF PROCEDURE:  01/04/2014    CARDIAC CATHETERIZATION     HISTORY:   [name] is a 33 year old obese female who suffered an anterior wall myocardial infarction in January 2013 and was found to have a 95% stenosis in her LAD and ramus vessel.  She underwent bare-metal stenting to the LAD with a vision stent and cutting balloon angioplasty of the ramus vessel.  Repeat catheterization in June 2013 showed patent vessels and stent.  She was recently seen by Dr. Dina Rich with chest pain with some sharp, stabbing features.  She underwent a nuclear perfusion study which suggested anterior ischemia.  She now presents for definitive repeat cardiac catheterization.   PROCEDURE:  The patient was brought to the second floor [facility] cath lab in the postabsorptive state.  A right radial approach was utilized after she had a positive Allen's test verified.  Versed 2 mg and fentanyl 50 mcg were administered for conscious sedation.  Radial artery was punctured by the Seldinger technique and a 5/6 French glide sheaths Lunder was inserted without difficulty.  Radial cocktail, consisting of verapamil, nitroglycerin, and lidocaine was administered.    5000 units of heparin was administered.  Once the initial right catheter and versicle wire were advanced into the central aorta. Diagnostic catheterization was done utilizing JR 4, and JL 3.5 catheters.  A 5 French pigtail catheter was used for left ventriculography.  The TR band was applied at 1555 and 11 cc of air for hemostasis.  The patient tolerated the procedure well.  Catheterization laboratory pain-free with stable hemodynamics.  Total contrast used: 75 cc of Isovue.  HEMODYNAMICS:   Central Aorta: 100/60   Left Ventricle: 100/3  ANGIOGRAPHY:  1. Left main: Normal and trifurcated  into an LAD, ramus intermediate vessel, and a dominant left circumflex coronary artery  2. LAD: Barbera Setters gave rise to a proximal large first diagonal vessel.  The LAD just after the diagonal vessel had a widely patent stent.  The remainder of the LAD was free of significant disease and extended to the LV apex. 3. Ramus Intermediate: No evidence for restenosis at previous site of ostial cutting balloon intervention. 4. Left circumflex: Angiographically normal.  Dominant vessel, which gave rise to 2 marginal branches and in the PDA.Marland Kitchen  4. Right coronary artery: Angiographically normal, nondominant vessel  Left ventriculography revealed global LV function.  There was a suggestion of mild residual distal anterolateral hypocontractility.  There was catheter-induced mitral regurgitation.  IMPRESSION:  Normal LV function with mild distal anterolateral, residual hypocontractility  No significant residual coronary obstructive disease with evidence for widely patent LAD stent, no evidence for restenosis of the ramus intermediate vessel, normal dominant left circumflex coronary artery and normal nondominant right coronary artery.  RECOMMENDATION:  Medical therapy with weight reduction and smoking cessation.  Lennette Bihari, MD, [facility] 01/04/2014 4:00 PM

## 2014-01-10 ENCOUNTER — Encounter: Payer: Self-pay | Admitting: Cardiovascular Disease

## 2014-01-15 ENCOUNTER — Other Ambulatory Visit: Payer: Self-pay | Admitting: Cardiology

## 2014-02-25 NOTE — Progress Notes (Signed)
This encounter was created in error - please disregard.

## 2014-09-05 ENCOUNTER — Encounter (HOSPITAL_COMMUNITY): Payer: Self-pay | Admitting: Cardiology

## 2014-10-18 ENCOUNTER — Ambulatory Visit: Payer: Medicaid Other | Admitting: Cardiology

## 2014-12-13 ENCOUNTER — Encounter: Payer: Medicaid Other | Admitting: Cardiology

## 2014-12-13 ENCOUNTER — Encounter: Payer: Self-pay | Admitting: *Deleted

## 2014-12-13 NOTE — Progress Notes (Signed)
Clinical Summary [name] is a 34 y.o.female  1. CAD - prior anterior MI 09/2011, 95% lesion in mid LAD and ramus with 95% ostial stenosis. LVEF 55% by LV gram, apical hypokinesis. BMS to mid LAD and cutting balloon angioplasty of ramus.  - repeat cath 02/2012 with patent vessels and stent - 09/2011 Echo: LVEF 50% - 12/2013 Lexiscan large anteroseptal ischemia - cath 12/2013 with patent vessels   - chest pain on Wednesday, stabbing feeling in mid chest to right shoulder. 10/10. Took NG with some benefit. + SOB + Palps + Diaphoresis. Symptoms occurred at rest.  - negative EKG and cardiac enzymes. CT PE negative.  - diagnosed with pneumonia, completed abx. Symptoms resolving.  - has noted some progressive DOE over the last few months as well  2. HTN - compliant with medications  3. Hyperlipidemia - no recent panel in our system - compliant with statin Past Medical History  Diagnosis Date  . Morbid obesity   . Migraines   . Hypertension   . Hyperlipidemia   . Anxiety   . Coronary artery disease 09/2011    a. Anterior MI. PCI and BMS to LAD and angioplsty to ostial Ramus;  b. 02/2012 Cath patent stent and ptca site, med rx.  . Diabetes mellitus      Allergies  Allergen Reactions  . Bee Venom Swelling  . Sulfa Drugs Cross Reactors Nausea And Vomiting     Current Outpatient Prescriptions  Medication Sig Dispense Refill  . aspirin 81 MG tablet Take 81 mg by mouth daily.    Marland Kitchen atorvastatin (LIPITOR) 40 MG tablet TAKE 1 TABLET BY MOUTH EVERY DAY. 30 tablet 6  . carvedilol (COREG) 12.5 MG tablet TAKE 1 TABLET BY MOUTH TWICE DAILY WITH A MEAL. 60 tablet 6  . clopidogrel (PLAVIX) 75 MG tablet TAKE 1 TABLET BY MOUTH EVERY DAY. 30 tablet 6  . gabapentin (NEURONTIN) 300 MG capsule Take 600 mg by mouth 2 (two) times daily.     Marland Kitchen lisinopril (PRINIVIL,ZESTRIL) 2.5 MG tablet Take 1 tablet (2.5 mg total) by mouth daily. 30 tablet 6  . medroxyPROGESTERone (PROVERA) 5 MG tablet Take  5 mg by mouth daily.    . metFORMIN (GLUCOPHAGE) 500 MG tablet Take 1 tablet (500 mg total) by mouth 2 (two) times daily with a meal.    . nitroGLYCERIN (NITROSTAT) 0.4 MG SL tablet Place 0.4 mg under the tongue every 5 (five) minutes as needed for chest pain.    . Skin Protectants, Misc. (EUCERIN) cream Apply 1 application topically as needed (dry hands).      No current facility-administered medications for this visit.     Past Surgical History  Procedure Laterality Date  . Cholecystectomy    . Cesarean section    . Coronary angioplasty with stent placement  10/08/2011    LAD: BMS, Ramus: cutting balloon angioplasty  . Cardiac catheterization  10/08/2011    LAD: 95% mid, Ramus: 95% ostial  . Left heart catheterization with coronary angiogram N/A 10/08/2011    Procedure: LEFT HEART CATHETERIZATION WITH CORONARY ANGIOGRAM;  Surgeon: Peter M Swaziland, MD;  Location: [facility] CATH LAB;  Service: Cardiovascular;  Laterality: N/A;  . Left heart catheterization with coronary angiogram N/A 03/02/2012    Procedure: LEFT HEART CATHETERIZATION WITH CORONARY ANGIOGRAM;  Surgeon: Kathleene Hazel, MD;  Location: [facility] CATH LAB;  Service: Cardiovascular;  Laterality: N/A;  . Left heart catheterization with coronary angiogram N/A 01/04/2014    Procedure: LEFT  HEART CATHETERIZATION WITH CORONARY ANGIOGRAM;  Surgeon: Lennette Bihari, MD;  Location: [facility] CATH LAB;  Service: Cardiovascular;  Laterality: N/A;     Allergies  Allergen Reactions  . Bee Venom Swelling  . Sulfa Drugs Cross Reactors Nausea And Vomiting      Family History  Problem Relation Age of Onset  . Heart attack Father      Social History [name] reports that she has been smoking Cigarettes.  She has a 6 pack-year smoking history. She has never used smokeless tobacco. [name] reports that she does not drink alcohol.   Review of Systems CONSTITUTIONAL: No weight loss, fever, chills, weakness or fatigue.  HEENT: Eyes: No visual  loss, blurred vision, double vision or yellow sclerae.No hearing loss, sneezing, congestion, runny nose or sore throat.  SKIN: No rash or itching.  CARDIOVASCULAR:  RESPIRATORY: No shortness of breath, cough or sputum.  GASTROINTESTINAL: No anorexia, nausea, vomiting or diarrhea. No abdominal pain or blood.  GENITOURINARY: No burning on urination, no polyuria NEUROLOGICAL: No headache, dizziness, syncope, paralysis, ataxia, numbness or tingling in the extremities. No change in bowel or bladder control.  MUSCULOSKELETAL: No muscle, back pain, joint pain or stiffness.  LYMPHATICS: No enlarged nodes. No history of splenectomy.  PSYCHIATRIC: No history of depression or anxiety.  ENDOCRINOLOGIC: No reports of sweating, cold or heat intolerance. No polyuria or polydipsia.  Marland Kitchen   Physical Examination There were no vitals filed for this visit. There were no vitals filed for this visit.  Gen: resting comfortably, no acute distress HEENT: no scleral icterus, pupils equal round and reactive, no palptable cervical adenopathy,  CV Resp: Clear to auscultation bilaterally GI: abdomen is soft, non-tender, non-distended, normal bowel sounds, no hepatosplenomegaly MSK: extremities are warm, no edema.  Skin: warm, no rash Neuro:  no focal deficits Psych: appropriate affect   Diagnostic Studies 02/2012 Cath Hemodynamic Findings:  Central aortic pressure: 108/70  Left ventricular pressure: 123/10/21  Angiographic Findings:  Left main: No obstructive disease noted.  Left Anterior Descending Artery: Large caliber vessel that courses to the apex. There is a stent present in the mid vessel that is widely patent with no restenosis. The remainder of the LAD is disease free. There is a moderate sized diagonal [name] with 30% proximal stenosis.  Circumflex Artery: Dominant, large caliber vessel with no disease throughout the AV groove segment. There is small to moderate sized intermediate [name] that has  ostial 30% stenosis. The remainder of the Circumflex has no obstructive disease.  Right Coronary Artery: Small, non-dominant vessel. No disease noted.  Left Ventricular Angiogram: LVEF 50-55%.  Impression:  1. Double vessel CAD with patent stent mid LAD and patent angioplasty site at ostium of intermediate Dae Highley.  2. Normal LV systolic function    09/2011 Cath PROCEDURAL FINDINGS  Hemodynamics:  AO 135/92 with a mean of 114 mmHg  LV 136/25 mmHg  Coronary angiography:  Coronary dominance: Left  Left mainstem: Normal.  Left anterior descending (LAD): There is a 95% stenosis in the mid LAD immediately after the takeoff of the first diagonal. The first diagonal Leone Mobley is moderate in size and appears normal. The LAD stenosis does appear to be hazy.  There is a moderately large ramus intermediate Jahari Wiginton which has a 95% ostial stenosis.  Left circumflex (LCx): This is a dominant vessel and appears normal throughout.  Right coronary artery (RCA): This is a small nondominant vessel and is normal.  Left ventriculography: Left ventricular systolic function is abnormal with apical  hypokinesis. LVEF is estimated at 55%, there is no significant mitral regurgitation  PCI Note: Following the diagnostic procedure, the decision was made to proceed with PCI of the LAD. Weight-based bivalirudin was given for anticoagulation. Once a therapeutic ACT was achieved, a 5 [place] EBU guide catheter was inserted. A pro-water coronary guidewire was used to cross the lesion. The lesion was predilated with a 2.5 mm balloon. The lesion was then stented with a 3.0 x 18 mm vision stent. The stent was postdilated with a 3.25 mm noncompliant balloon. Following PCI, there was 0% residual stenosis and TIMI-3 flow. At this point the patient's pain was improved but was Laursen of moderate intensity. We then proceeded to intervene on the ostial ramus intermediate lesion. This was crossed with the prowater wire. This was  dilated using a 3.0 x 10 mm cutting balloon performing 3 inflations to 6 atmospheres. This showed a good angiographic result with less than 30% residual stenosis and TIMI grade 3 flow. Final angiography confirmed an excellent result. The patient tolerated the procedure well. There were no immediate procedural complications. A TR band was used for radial hemostasis. The patient was transferred to the post catheterization recovery area for further monitoring.  PCI Data:  Vessel - LAD/Segment - mid vessel immediately after the takeoff of the first diagonal.  Percent Stenosis (pre) 95%  TIMI-flow 3.  Stent 3.0 x 18 mm vision stent.  Percent Stenosis (post) 0%  TIMI-flow (post) 3.  Second vessel: Ostial ramus intermediate Rusti Arizmendi  Percent stenosis: 95%.  TIMI flow 3.  3.0 mm cutting balloon  Percent stenosis (post) less than 30%  TIMI flow 3  Final Conclusions:  1. Severe 2 vessel obstructive coronary disease.  2. Overall well preserved LV systolic function with apical wall motion abnormality and ejection fraction of 55%.  3. Successful intracoronary stenting of the mid LAD with a bare-metal stent.  4. Successful cutting balloon angioplasty of the ramus intermediate Kathryn Linarez.  Recommendations:  Continue therapy with Plavix. Patient reports a history of allergy to aspirin. Aggressive risk factor modification.  Disposition: Patient be observed in the intensive care unit tonight. If she has no complications she should be able to be transferred to telemetry in the morning.   12/2013 Lexiscan MPI HEMODYNAMICS:  Central Aorta: 100/60  Left Ventricle: 100/3  ANGIOGRAPHY:  1. Left main: Normal and trifurcated into an LAD, ramus intermediate vessel, and a dominant left circumflex coronary artery  2. LAD: Barbera Setters gave rise to a proximal large first diagonal vessel. The LAD just after the diagonal vessel had a widely patent stent. The remainder of the LAD was free of  significant disease and extended to the LV apex. 3. Ramus Intermediate: No evidence for restenosis at previous site of ostial cutting balloon intervention. 4. Left circumflex: Angiographically normal. Dominant vessel, which gave rise to 2 marginal branches and in the PDA.Marland Kitchen  4. Right coronary artery: Angiographically normal, nondominant vessel  Left ventriculography revealed global LV function. There was a suggestion of mild residual distal anterolateral hypocontractility. There was catheter-induced mitral regurgitation.  IMPRESSION:  Normal LV function with mild distal anterolateral, residual hypocontractility  No significant residual coronary obstructive disease with evidence for widely patent LAD stent, no evidence for restenosis of the ramus intermediate vessel, normal dominant left circumflex coronary artery and normal nondominant right coronary artery.  RECOMMENDATION:  Medical therapy with weight reduction and smoking cessation. IMPRESSION: 1. Abnormal Lexiscan for ischemia  2. Large anteroseptal and apical area of ischemia  3. High  risk study for major cardiac events based on area of myocardium at [facility]  4. Normal left ventricular systolic function, left ventricular ejection fraction 52%  12/2013 Cath  Assessment and Plan  1. CAD/Chest pain - recent episode of chest pain along with progressing DOE - given history of CAD, will obtain lexiscan to evaluate for ischemia - obtain echo in setting of progressing DOE  2. HTN - continue current meds, start low dose ACE-I in setting of CAD and DM. Some question if previously causes a cough or not in past, will follow symptoms.   3. Hyperlipidemia - obtain fasting lipid panel - continue current statin for now      Antoine Poche, M.D., F.A.C.C.

## 2015-01-18 ENCOUNTER — Encounter: Payer: Self-pay | Admitting: Family Medicine

## 2015-02-05 ENCOUNTER — Ambulatory Visit: Payer: Medicaid Other | Admitting: Cardiology

## 2015-02-25 ENCOUNTER — Encounter: Payer: Self-pay | Admitting: Cardiology

## 2015-02-25 ENCOUNTER — Ambulatory Visit (INDEPENDENT_AMBULATORY_CARE_PROVIDER_SITE_OTHER): Payer: Medicaid - Out of State | Admitting: Cardiology

## 2015-02-25 VITALS — BP 118/72 | HR 91 | Ht 71.0 in | Wt 382.4 lb

## 2015-02-25 DIAGNOSIS — E785 Hyperlipidemia, unspecified: Secondary | ICD-10-CM

## 2015-02-25 DIAGNOSIS — I10 Essential (primary) hypertension: Secondary | ICD-10-CM

## 2015-02-25 DIAGNOSIS — I2510 Atherosclerotic heart disease of native coronary artery without angina pectoris: Secondary | ICD-10-CM

## 2015-02-25 NOTE — Progress Notes (Signed)
Clinical Summary [name] is a 34 y.o.female seen today for follow up of the following medical problems.   1. CAD - prior anterior MI 09/2011, 95% lesion in mid LAD and ramus with 95% ostial stenosis. LVEF 55% by LV gram, apical hypokinesis. BMS to mid LAD and cutting balloon angioplasty of ramus.  - repeat cath 02/2012 with patent vessels and stent - 09/2011 Echo: LVEF 50% - 12/2013 Lexiscan large anteroseptal ischemia - cath 12/2013 with patent vessels  - no recent chest pain since 05/2014. She states around that time she was started on anxiety medications, and has not had significant chest pain since.  - compliant with meds   2. HTN - compliant with medications  3. Hyperlipidemia - no recent panel in our system - compliant with statin   Past Medical History  Diagnosis Date  . Morbid obesity   . Migraines   . Hypertension   . Hyperlipidemia   . Anxiety   . Coronary artery disease 09/2011    a. Anterior MI. PCI and BMS to LAD and angioplsty to ostial Ramus;  b. 02/2012 Cath patent stent and ptca site, med rx.  . Diabetes mellitus      Allergies  Allergen Reactions  . Bee Venom Swelling  . Sulfa Drugs Cross Reactors Nausea And Vomiting     Current Outpatient Prescriptions  Medication Sig Dispense Refill  . aspirin 81 MG tablet Take 81 mg by mouth daily.    Marland Kitchen atorvastatin (LIPITOR) 40 MG tablet TAKE 1 TABLET BY MOUTH EVERY DAY. 30 tablet 6  . carvedilol (COREG) 12.5 MG tablet TAKE 1 TABLET BY MOUTH TWICE DAILY WITH A MEAL. 60 tablet 6  . clopidogrel (PLAVIX) 75 MG tablet TAKE 1 TABLET BY MOUTH EVERY DAY. 30 tablet 6  . gabapentin (NEURONTIN) 300 MG capsule Take 600 mg by mouth 2 (two) times daily.     Marland Kitchen lisinopril (PRINIVIL,ZESTRIL) 2.5 MG tablet Take 1 tablet (2.5 mg total) by mouth daily. 30 tablet 6  . medroxyPROGESTERone (PROVERA) 5 MG tablet Take 5 mg by mouth daily.    . metFORMIN (GLUCOPHAGE) 500 MG tablet Take 1 tablet (500 mg total) by mouth 2 (two) times  daily with a meal.    . nitroGLYCERIN (NITROSTAT) 0.4 MG SL tablet Place 0.4 mg under the tongue every 5 (five) minutes as needed for chest pain.    . Skin Protectants, Misc. (EUCERIN) cream Apply 1 application topically as needed (dry hands).      No current facility-administered medications for this visit.     Past Surgical History  Procedure Laterality Date  . Cholecystectomy    . Cesarean section    . Coronary angioplasty with stent placement  10/08/2011    LAD: BMS, Ramus: cutting balloon angioplasty  . Cardiac catheterization  10/08/2011    LAD: 95% mid, Ramus: 95% ostial  . Left heart catheterization with coronary angiogram N/A 10/08/2011    Procedure: LEFT HEART CATHETERIZATION WITH CORONARY ANGIOGRAM;  Surgeon: Peter M Swaziland, MD;  Location: [facility] CATH LAB;  Service: Cardiovascular;  Laterality: N/A;  . Left heart catheterization with coronary angiogram N/A 03/02/2012    Procedure: LEFT HEART CATHETERIZATION WITH CORONARY ANGIOGRAM;  Surgeon: Kathleene Hazel, MD;  Location: [facility] CATH LAB;  Service: Cardiovascular;  Laterality: N/A;  . Left heart catheterization with coronary angiogram N/A 01/04/2014    Procedure: LEFT HEART CATHETERIZATION WITH CORONARY ANGIOGRAM;  Surgeon: Lennette Bihari, MD;  Location: [facility] CATH LAB;  Service:  Cardiovascular;  Laterality: N/A;     Allergies  Allergen Reactions  . Bee Venom Swelling  . Sulfa Drugs Cross Reactors Nausea And Vomiting      Family History  Problem Relation Age of Onset  . Heart attack Father      Social History [name] reports that she has been smoking Cigarettes.  She has a 6 pack-year smoking history. She has never used smokeless tobacco. [name] reports that she does not drink alcohol.   Review of Systems CONSTITUTIONAL: No weight loss, fever, chills, weakness or fatigue.  HEENT: Eyes: No visual loss, blurred vision, double vision or yellow sclerae.No hearing loss, sneezing, congestion, runny nose or sore throat.    SKIN: No rash or itching.  CARDIOVASCULAR: per HPI RESPIRATORY: No shortness of breath, cough or sputum.  GASTROINTESTINAL: No anorexia, nausea, vomiting or diarrhea. No abdominal pain or blood.  GENITOURINARY: No burning on urination, no polyuria NEUROLOGICAL: No headache, dizziness, syncope, paralysis, ataxia, numbness or tingling in the extremities. No change in bowel or bladder control.  MUSCULOSKELETAL: No muscle, back pain, joint pain or stiffness.  LYMPHATICS: No enlarged nodes. No history of splenectomy.  PSYCHIATRIC: No history of depression or anxiety.  ENDOCRINOLOGIC: No reports of sweating, cold or heat intolerance. No polyuria or polydipsia.  Marland Kitchen   Physical Examination Filed Vitals:   02/25/15 1207  BP: 118/72  Pulse: 91   Filed Vitals:   02/25/15 1207  Height:  (1.803 m)  Weight: 382 lb 6.4 oz (173.456 kg)    Gen: resting comfortably, no acute distress HEENT: no scleral icterus, pupils equal round and reactive, no palptable cervical adenopathy,  CV: RRR, no m/r/g, no JVD Resp: Clear to auscultation bilaterally GI: abdomen is soft, non-tender, non-distended, normal bowel sounds, no hepatosplenomegaly MSK: extremities are warm, no edema.  Skin: warm, no rash Neuro:  no focal deficits Psych: appropriate affect   Diagnostic Studies  02/2012 Cath Hemodynamic Findings:  Central aortic pressure: 108/70  Left ventricular pressure: 123/10/21  Angiographic Findings:  Left main: No obstructive disease noted.  Left Anterior Descending Artery: Large caliber vessel that courses to the apex. There is a stent present in the mid vessel that is widely patent with no restenosis. The remainder of the LAD is disease free. There is a moderate sized diagonal Jerrod Damiano with 30% proximal stenosis.  Circumflex Artery: Dominant, large caliber vessel with no disease throughout the AV groove segment. There is small to moderate sized intermediate Kaislee Chao that has ostial 30%  stenosis. The remainder of the Circumflex has no obstructive disease.  Right Coronary Artery: Small, non-dominant vessel. No disease noted.  Left Ventricular Angiogram: LVEF 50-55%.  Impression:  1. Double vessel CAD with patent stent mid LAD and patent angioplasty site at ostium of intermediate Stefanos Haynesworth.  2. Normal LV systolic function    09/2011 Cath PROCEDURAL FINDINGS  Hemodynamics:  AO 135/92 with a mean of 114 mmHg  LV 136/25 mmHg  Coronary angiography:  Coronary dominance: Left  Left mainstem: Normal.  Left anterior descending (LAD): There is a 95% stenosis in the mid LAD immediately after the takeoff of the first diagonal. The first diagonal Nicolina Hirt is moderate in size and appears normal. The LAD stenosis does appear to be hazy.  There is a moderately large ramus intermediate [place]ound Katich which has a 95% ostial stenosis.  Left circumflex (LCx): This is a dominant vessel and appears normal throughout.  Right coronary artery (RCA): This is a small nondominant vessel and is normal.  Left ventriculography: Left ventricular systolic function is abnormal with apical hypokinesis. LVEF is estimated at 55%, there is no significant mitral regurgitation  PCI Note: Following the diagnostic procedure, the decision was made to proceed with PCI of the LAD. Weight-based bivalirudin was given for anticoagulation. Once a therapeutic ACT was achieved, a 5 [place] EBU guide catheter was inserted. A pro-water coronary guidewire was used to cross the lesion. The lesion was predilated with a 2.5 mm balloon. The lesion was then stented with a 3.0 x 18 mm vision stent. The stent was postdilated with a 3.25 mm noncompliant balloon. Following PCI, there was 0% residual stenosis and TIMI-3 flow. At this point the patient's pain was improved but was Hubbard of moderate intensity. We then proceeded to intervene on the ostial ramus intermediate lesion. This was crossed with the prowater wire. This was dilated  using a 3.0 x 10 mm cutting balloon performing 3 inflations to 6 atmospheres. This showed a good angiographic result with less than 30% residual stenosis and TIMI grade 3 flow. Final angiography confirmed an excellent result. The patient tolerated the procedure well. There were no immediate procedural complications. A TR band was used for radial hemostasis. The patient was transferred to the post catheterization recovery area for further monitoring.  PCI Data:  Vessel - LAD/Segment - mid vessel immediately after the takeoff of the first diagonal.  Percent Stenosis (pre) 95%  TIMI-flow 3.  Stent 3.0 x 18 mm vision stent.  Percent Stenosis (post) 0%  TIMI-flow (post) 3.  Second vessel: Ostial ramus intermediate Justin Meisenheimer  Percent stenosis: 95%.  TIMI flow 3.  3.0 mm cutting balloon  Percent stenosis (post) less than 30%  TIMI flow 3  Final Conclusions:  1. Severe 2 vessel obstructive coronary disease.  2. Overall well preserved LV systolic function with apical wall motion abnormality and ejection fraction of 55%.  3. Successful intracoronary stenting of the mid LAD with a bare-metal stent.  4. Successful cutting balloon angioplasty of the ramus intermediate Raygen Linquist.  Recommendations:  Continue therapy with Plavix. Patient reports a history of allergy to aspirin. Aggressive risk factor modification.  Disposition: Patient be observed in the intensive care unit tonight. If she has no complications she should be able to be transferred to telemetry in the morning.   12/2013 Cath HEMODYNAMICS:  Central Aorta: 100/60  Left Ventricle: 100/3  ANGIOGRAPHY:  1. Left main: Normal and trifurcated into an LAD, ramus intermediate vessel, and a dominant left circumflex coronary artery  2. LAD: Barbera Setters gave rise to a proximal large first diagonal vessel. The LAD just after the diagonal vessel had a widely patent stent. The remainder of the LAD was free of significant disease and  extended to the LV apex. 3. Ramus Intermediate: No evidence for restenosis at previous site of ostial cutting balloon intervention. 4. Left circumflex: Angiographically normal. Dominant vessel, which gave rise to 2 marginal branches and in the PDA.Marland Kitchen  4. Right coronary artery: Angiographically normal, nondominant vessel  Left ventriculography revealed global LV function. There was a suggestion of mild residual distal anterolateral hypocontractility. There was catheter-induced mitral regurgitation.  IMPRESSION:  Normal LV function with mild distal anterolateral, residual hypocontractility  No significant residual coronary obstructive disease with evidence for widely patent LAD stent, no evidence for restenosis of the ramus intermediate vessel, normal dominant left circumflex coronary artery and normal nondominant right coronary artery.  RECOMMENDATION:  Medical therapy with weight reduction and smoking cessation. IMPRESSION: 1. Abnormal Lexiscan for ischemia  2. Large  anteroseptal and apical area of ischemia  3. High risk study for major cardiac events based on area of myocardium at [facility]  4. Normal left ventricular systolic function, left ventricular ejection fraction 52%     Assessment and Plan  1. CAD - no current symptoms, continue current meds - ok to stop plavix, no indication for continued use. Continue ASA  daily.  2. HTN - at goal, continue current meds  3. Hyperlipidemia - request lipid panel form pcp - continue currnet statin  F/u 6 months  Antoine Poche, M.D.

## 2015-02-25 NOTE — Patient Instructions (Signed)
Your physician wants you to follow-up in: 6 months with Dr Lurena Joiner will receive a reminder letter in the mail two months in advance. If you don't receive a letter, please call our office to schedule the follow-up appointment.    STOP Plavix    Thank you for choosing [facility] !

## 2015-05-30 ENCOUNTER — Telehealth: Payer: Self-pay | Admitting: *Deleted

## 2015-05-30 NOTE — Telephone Encounter (Signed)
Karen from Dr. Micah Flesher office (dentist) wanting pt to have 4 wisdom teeth extracted under IV sedation, if OK from cardiac standpoint and if plavix needed to be held. Will forward to Dr. Wyline Mood

## 2015-05-30 NOTE — Telephone Encounter (Signed)
Fax note to dentist [phone]         Addendum- tell dentist how many days she needs to be off ASA

## 2015-05-30 NOTE — Telephone Encounter (Signed)
Notified pt and  Clydie Braun at dentist faxed to their office

## 2015-05-30 NOTE — Telephone Encounter (Signed)
From my last note 01/2015 she was to stop taking plavix and take only aspirin since it has been several years since her last stent. Please varify what she is taking. It is ok to hold aspirin and or plavix as needed for her dental procedure.   Dominga Ferry MD

## 2015-05-30 NOTE — Telephone Encounter (Signed)
Typically aspirin and plavix held 7 days prior to procedure, restart day after  Dominga Ferry MD

## 2015-05-30 NOTE — Telephone Encounter (Signed)
Dentist wants you to tell them how many days pt should aspirin,they do not make recommendation

## 2015-12-18 ENCOUNTER — Ambulatory Visit: Payer: Medicaid - Out of State | Admitting: Cardiology

## 2015-12-26 ENCOUNTER — Ambulatory Visit: Payer: Medicaid Other | Admitting: Cardiology

## 2015-12-31 ENCOUNTER — Encounter: Payer: Medicaid Other | Admitting: Cardiology

## 2015-12-31 NOTE — Progress Notes (Signed)
Patient ID: Laurena Slimmer Adkison, female   DOB: 19-Jun-1981, 35 y.o.   MRN: [id number]     Clinical Summary Ms. Clementson is a 35 y.o.female seen today for follow up of the following medical problems.   1. CAD - prior anterior MI 09/2011, 95% lesion in mid LAD and ramus with 95% ostial stenosis. LVEF 55% by LV gram, apical hypokinesis. BMS to mid LAD and cutting balloon angioplasty of ramus.  - repeat cath 02/2012 with patent vessels and stent - 09/2011 Echo: LVEF 50% - 12/2013 Lexiscan large anteroseptal ischemia - cath 12/2013 with patent vessels  - no recent chest pain since 05/2014. She states around that time she was started on anxiety medications, and has not had significant chest pain since.  - compliant with meds   2. HTN - compliant with medications  3. Hyperlipidemia - no recent panel in our system - compliant with statin Past Medical History  Diagnosis Date  . Morbid obesity   . Migraines   . Hypertension   . Hyperlipidemia   . Anxiety   . Coronary artery disease 09/2011    a. Anterior MI. PCI and BMS to LAD and angioplsty to ostial Ramus;  b. 02/2012 Cath patent stent and ptca site, med rx.  . Diabetes mellitus      Allergies  Allergen Reactions  . Bee Venom Swelling  . Sulfa Drugs Cross Reactors Nausea And Vomiting     Current Outpatient Prescriptions  Medication Sig Dispense Refill  . aspirin 81 MG tablet Take 81 mg by mouth daily.    Marland Kitchen atorvastatin (LIPITOR) 80 MG tablet Take 80 mg by mouth daily.    . carvedilol (COREG) 12.5 MG tablet TAKE 1 TABLET BY MOUTH TWICE DAILY WITH A MEAL. 60 tablet 6  . escitalopram (LEXAPRO) 10 MG tablet Take 10 mg by mouth daily.    Marland Kitchen lisinopril (PRINIVIL,ZESTRIL) 2.5 MG tablet Take 1 tablet (2.5 mg total) by mouth daily. 30 tablet 6  . medroxyPROGESTERone (PROVERA) 5 MG tablet Take 5 mg by mouth daily.    . metFORMIN (GLUCOPHAGE) 500 MG tablet Take 1 tablet (500 mg total) by mouth 2 (two) times daily with a meal.    . nitroGLYCERIN  (NITROSTAT) 0.4 MG SL tablet Place 0.4 mg under the tongue every 5 (five) minutes as needed for chest pain.    . pregabalin (LYRICA) 75 MG capsule Take 75 mg by mouth 2 (two) times daily.    . Skin Protectants, Misc. (EUCERIN) cream Apply 1 application topically as needed (dry hands).     . varenicline (CHANTIX) 1 MG tablet Take 1 mg by mouth 2 (two) times daily.     No current facility-administered medications for this visit.     Past Surgical History  Procedure Laterality Date  . Cholecystectomy    . Cesarean section    . Coronary angioplasty with stent placement  10/08/2011    LAD: BMS, Ramus: cutting balloon angioplasty  . Cardiac catheterization  10/08/2011    LAD: 95% mid, Ramus: 95% ostial  . Left heart catheterization with coronary angiogram N/A 10/08/2011    Procedure: LEFT HEART CATHETERIZATION WITH CORONARY ANGIOGRAM;  Surgeon: Peter M Swaziland, MD;  Location: [facility] CATH LAB;  Service: Cardiovascular;  Laterality: N/A;  . Left heart catheterization with coronary angiogram N/A 03/02/2012    Procedure: LEFT HEART CATHETERIZATION WITH CORONARY ANGIOGRAM;  Surgeon: Kathleene Hazel, MD;  Location: [facility] CATH LAB;  Service: Cardiovascular;  Laterality: N/A;  . Left heart catheterization  with coronary angiogram N/A 01/04/2014    Procedure: LEFT HEART CATHETERIZATION WITH CORONARY ANGIOGRAM;  Surgeon: Lennette Bihari, MD;  Location: [facility] CATH LAB;  Service: Cardiovascular;  Laterality: N/A;     Allergies  Allergen Reactions  . Bee Venom Swelling  . Sulfa Drugs Cross Reactors Nausea And Vomiting      Family History  Problem Relation Age of Onset  . Heart attack Father      Social History Ms. Brumm reports that she has been smoking Cigarettes.  She has a 6 pack-year smoking history. She has never used smokeless tobacco. Ms. Teat reports that she does not drink alcohol.   Review of Systems CONSTITUTIONAL: No weight loss, fever, chills, weakness or fatigue.  HEENT: Eyes: No  visual loss, blurred vision, double vision or yellow sclerae.No hearing loss, sneezing, congestion, runny nose or sore throat.  SKIN: No rash or itching.  CARDIOVASCULAR:  RESPIRATORY: No shortness of breath, cough or sputum.  GASTROINTESTINAL: No anorexia, nausea, vomiting or diarrhea. No abdominal pain or blood.  GENITOURINARY: No burning on urination, no polyuria NEUROLOGICAL: No headache, dizziness, syncope, paralysis, ataxia, numbness or tingling in the extremities. No change in bowel or bladder control.  MUSCULOSKELETAL: No muscle, back pain, joint pain or stiffness.  LYMPHATICS: No enlarged nodes. No history of splenectomy.  PSYCHIATRIC: No history of depression or anxiety.  ENDOCRINOLOGIC: No reports of sweating, cold or heat intolerance. No polyuria or polydipsia.  Marland Kitchen   Physical Examination There were no vitals filed for this visit. There were no vitals filed for this visit.  Gen: resting comfortably, no acute distress HEENT: no scleral icterus, pupils equal round and reactive, no palptable cervical adenopathy,  CV Resp: Clear to auscultation bilaterally GI: abdomen is soft, non-tender, non-distended, normal bowel sounds, no hepatosplenomegaly MSK: extremities are warm, no edema.  Skin: warm, no rash Neuro:  no focal deficits Psych: appropriate affect   Diagnostic Studies 02/2012 Cath Hemodynamic Findings:  Central aortic pressure: 108/70  Left ventricular pressure: 123/10/21  Angiographic Findings:  Left main: No obstructive disease noted.  Left Anterior Descending Artery: Large caliber vessel that courses to the apex. There is a stent present in the mid vessel that is widely patent with no restenosis. The remainder of the LAD is disease free. There is a moderate sized diagonal Izzac Rockett with 30% proximal stenosis.  Circumflex Artery: Dominant, large caliber vessel with no disease throughout the AV groove segment. There is small to moderate sized intermediate Greysin Medlen  that has ostial 30% stenosis. The remainder of the Circumflex has no obstructive disease.  Right Coronary Artery: Small, non-dominant vessel. No disease noted.  Left Ventricular Angiogram: LVEF 50-55%.  Impression:  1. Double vessel CAD with patent stent mid LAD and patent angioplasty site at ostium of intermediate Oaklyn Mans.  2. Normal LV systolic function    09/2011 Cath PROCEDURAL FINDINGS  Hemodynamics:  AO 135/92 with a mean of 114 mmHg  LV 136/25 mmHg  Coronary angiography:  Coronary dominance: Left  Left mainstem: Normal.  Left anterior descending (LAD): There is a 95% stenosis in the mid LAD immediately after the takeoff of the first diagonal. The first diagonal Arias Weinert is moderate in size and appears normal. The LAD stenosis does appear to be hazy.  There is a moderately large ramus intermediate Percy Winterrowd which has a 95% ostial stenosis.  Left circumflex (LCx): This is a dominant vessel and appears normal throughout.  Right coronary artery (RCA): This is a small nondominant vessel and is normal.  Left ventriculography: Left ventricular systolic function is abnormal with apical hypokinesis. LVEF is estimated at 55%, there is no significant mitral regurgitation  PCI Note: Following the diagnostic procedure, the decision was made to proceed with PCI of the LAD. Weight-based bivalirudin was given for anticoagulation. Once a therapeutic ACT was achieved, a 5 [place] EBU guide catheter was inserted. A pro-water coronary guidewire was used to cross the lesion. The lesion was predilated with a 2.5 mm balloon. The lesion was then stented with a 3.0 x 18 mm vision stent. The stent was postdilated with a 3.25 mm noncompliant balloon. Following PCI, there was 0% residual stenosis and TIMI-3 flow. At this point the patient's pain was improved but was Graddy of moderate intensity. We then proceeded to intervene on the ostial ramus intermediate lesion. This was crossed with the prowater wire.  This was dilated using a 3.0 x 10 mm cutting balloon performing 3 inflations to 6 atmospheres. This showed a good angiographic result with less than 30% residual stenosis and TIMI grade 3 flow. Final angiography confirmed an excellent result. The patient tolerated the procedure well. There were no immediate procedural complications. A TR band was used for radial hemostasis. The patient was transferred to the post catheterization recovery area for further monitoring.  PCI Data:  Vessel - LAD/Segment - mid vessel immediately after the takeoff of the first diagonal.  Percent Stenosis (pre) 95%  TIMI-flow 3.  Stent 3.0 x 18 mm vision stent.  Percent Stenosis (post) 0%  TIMI-flow (post) 3.  Second vessel: Ostial ramus intermediate Lygia Olaes  Percent stenosis: 95%.  TIMI flow 3.  3.0 mm cutting balloon  Percent stenosis (post) less than 30%  TIMI flow 3  Final Conclusions:  1. Severe 2 vessel obstructive coronary disease.  2. Overall well preserved LV systolic function with apical wall motion abnormality and ejection fraction of 55%.  3. Successful intracoronary stenting of the mid LAD with a bare-metal stent.  4. Successful cutting balloon angioplasty of the ramus intermediate Reygan Heagle.  Recommendations:  Continue therapy with Plavix. Patient reports a history of allergy to aspirin. Aggressive risk factor modification.  Disposition: Patient be observed in the intensive care unit tonight. If she has no complications she should be able to be transferred to telemetry in the morning.   12/2013 Cath HEMODYNAMICS:  Central Aorta: 100/60  Left Ventricle: 100/3  ANGIOGRAPHY:  1. Left main: Normal and trifurcated into an LAD, ramus intermediate vessel, and a dominant left circumflex coronary artery  2. LAD: Barbera Setters gave rise to a proximal large first diagonal vessel. The LAD just after the diagonal vessel had a widely patent stent. The remainder of the LAD was free of  significant disease and extended to the LV apex. 3. Ramus Intermediate: No evidence for restenosis at previous site of ostial cutting balloon intervention. 4. Left circumflex: Angiographically normal. Dominant vessel, which gave rise to 2 marginal branches and in the PDA.Marland Kitchen  4. Right coronary artery: Angiographically normal, nondominant vessel  Left ventriculography revealed global LV function. There was a suggestion of mild residual distal anterolateral hypocontractility. There was catheter-induced mitral regurgitation.  IMPRESSION:  Normal LV function with mild distal anterolateral, residual hypocontractility  No significant residual coronary obstructive disease with evidence for widely patent LAD stent, no evidence for restenosis of the ramus intermediate vessel, normal dominant left circumflex coronary artery and normal nondominant right coronary artery.  RECOMMENDATION:  Medical therapy with weight reduction and smoking cessation. IMPRESSION: 1. Abnormal Lexiscan for ischemia  2. Large  anteroseptal and apical area of ischemia  3. High risk study for major cardiac events based on area of myocardium at [facility]  4. Normal left ventricular systolic function, left ventricular ejection fraction 52%    Assessment and Plan  1. CAD - no current symptoms, continue current meds - ok to stop plavix, no indication for continued use. Continue ASA 81mg  daily.  2. HTN - at goal, continue current meds  3. Hyperlipidemia - request lipid panel form pcp - continue currnet statin       Antoine Poche, M.D.

## 2016-01-30 ENCOUNTER — Ambulatory Visit: Payer: Medicaid Other | Admitting: Cardiology

## 2016-01-30 NOTE — Progress Notes (Unsigned)
Patient ID: Laurena Slimmer Fitzwater, female   DOB: 1981-09-13, 35 y.o.   MRN: [id number]     Clinical Summary Ms. Dutson is a 35 y.o.female  1. CAD - prior anterior MI 09/2011, 95% lesion in mid LAD and ramus with 95% ostial stenosis. LVEF 55% by LV gram, apical hypokinesis. BMS to mid LAD and cutting balloon angioplasty of ramus.  - repeat cath 02/2012 with patent vessels and stent - 09/2011 Echo: LVEF 50% - 12/2013 Lexiscan large anteroseptal ischemia - cath 12/2013 with patent vessels  - no recent chest pain since 05/2014. She states around that time she was started on anxiety medications, and has not had significant chest pain since.  - compliant with meds   2. HTN - compliant with medications  3. Hyperlipidemia - no recent panel in our system - compliant with statin Past Medical History  Diagnosis Date  . Morbid obesity   . Migraines   . Hypertension   . Hyperlipidemia   . Anxiety   . Coronary artery disease 09/2011    a. Anterior MI. PCI and BMS to LAD and angioplsty to ostial Ramus;  b. 02/2012 Cath patent stent and ptca site, med rx.  . Diabetes mellitus      Allergies  Allergen Reactions  . Bee Venom Swelling  . Sulfa Drugs Cross Reactors Nausea And Vomiting     Current Outpatient Prescriptions  Medication Sig Dispense Refill  . aspirin 81 MG tablet Take 81 mg by mouth daily.    Marland Kitchen atorvastatin (LIPITOR) 80 MG tablet Take 80 mg by mouth daily.    . carvedilol (COREG) 12.5 MG tablet TAKE 1 TABLET BY MOUTH TWICE DAILY WITH A MEAL. 60 tablet 6  . escitalopram (LEXAPRO) 10 MG tablet Take 10 mg by mouth daily.    Marland Kitchen lisinopril (PRINIVIL,ZESTRIL) 2.5 MG tablet Take 1 tablet (2.5 mg total) by mouth daily. 30 tablet 6  . medroxyPROGESTERone (PROVERA) 5 MG tablet Take 5 mg by mouth daily.    . metFORMIN (GLUCOPHAGE) 500 MG tablet Take 1 tablet (500 mg total) by mouth 2 (two) times daily with a meal.    . nitroGLYCERIN (NITROSTAT) 0.4 MG SL tablet Place 0.4 mg under the tongue  every 5 (five) minutes as needed for chest pain.    . pregabalin (LYRICA) 75 MG capsule Take 75 mg by mouth 2 (two) times daily.    . Skin Protectants, Misc. (EUCERIN) cream Apply 1 application topically as needed (dry hands).     . varenicline (CHANTIX) 1 MG tablet Take 1 mg by mouth 2 (two) times daily.     No current facility-administered medications for this visit.     Past Surgical History  Procedure Laterality Date  . Cholecystectomy    . Cesarean section    . Coronary angioplasty with stent placement  10/08/2011    LAD: BMS, Ramus: cutting balloon angioplasty  . Cardiac catheterization  10/08/2011    LAD: 95% mid, Ramus: 95% ostial  . Left heart catheterization with coronary angiogram N/A 10/08/2011    Procedure: LEFT HEART CATHETERIZATION WITH CORONARY ANGIOGRAM;  Surgeon: Peter M Swaziland, MD;  Location: [facility] CATH LAB;  Service: Cardiovascular;  Laterality: N/A;  . Left heart catheterization with coronary angiogram N/A 03/02/2012    Procedure: LEFT HEART CATHETERIZATION WITH CORONARY ANGIOGRAM;  Surgeon: Kathleene Hazel, MD;  Location: [facility] CATH LAB;  Service: Cardiovascular;  Laterality: N/A;  . Left heart catheterization with coronary angiogram N/A 01/04/2014    Procedure: LEFT HEART  CATHETERIZATION WITH CORONARY ANGIOGRAM;  Surgeon: Lennette Bihari, MD;  Location: [facility] CATH LAB;  Service: Cardiovascular;  Laterality: N/A;     Allergies  Allergen Reactions  . Bee Venom Swelling  . Sulfa Drugs Cross Reactors Nausea And Vomiting      Family History  Problem Relation Age of Onset  . Heart attack Father      Social History Ms. Jafari reports that she has been smoking Cigarettes.  She has a 6 pack-year smoking history. She has never used smokeless tobacco. Ms. Aird reports that she does not drink alcohol.   Review of Systems CONSTITUTIONAL: No weight loss, fever, chills, weakness or fatigue.  HEENT: Eyes: No visual loss, blurred vision, double vision or yellow sclerae.No  hearing loss, sneezing, congestion, runny nose or sore throat.  SKIN: No rash or itching.  CARDIOVASCULAR:  RESPIRATORY: No shortness of breath, cough or sputum.  GASTROINTESTINAL: No anorexia, nausea, vomiting or diarrhea. No abdominal pain or blood.  GENITOURINARY: No burning on urination, no polyuria NEUROLOGICAL: No headache, dizziness, syncope, paralysis, ataxia, numbness or tingling in the extremities. No change in bowel or bladder control.  MUSCULOSKELETAL: No muscle, back pain, joint pain or stiffness.  LYMPHATICS: No enlarged nodes. No history of splenectomy.  PSYCHIATRIC: No history of depression or anxiety.  ENDOCRINOLOGIC: No reports of sweating, cold or heat intolerance. No polyuria or polydipsia.  Marland Kitchen   Physical Examination There were no vitals filed for this visit. There were no vitals filed for this visit.  Gen: resting comfortably, no acute distress HEENT: no scleral icterus, pupils equal round and reactive, no palptable cervical adenopathy,  CV Resp: Clear to auscultation bilaterally GI: abdomen is soft, non-tender, non-distended, normal bowel sounds, no hepatosplenomegaly MSK: extremities are warm, no edema.  Skin: warm, no rash Neuro:  no focal deficits Psych: appropriate affect   Diagnostic Studies  02/2012 Cath Hemodynamic Findings:  Central aortic pressure: 108/70  Left ventricular pressure: 123/10/21  Angiographic Findings:  Left main: No obstructive disease noted.  Left Anterior Descending Artery: Large caliber vessel that courses to the apex. There is a stent present in the mid vessel that is widely patent with no restenosis. The remainder of the LAD is disease free. There is a moderate sized diagonal Minola Guin with 30% proximal stenosis.  Circumflex Artery: Dominant, large caliber vessel with no disease throughout the AV groove segment. There is small to moderate sized intermediate Raynard Mapps that has ostial 30% stenosis. The remainder of the Circumflex  has no obstructive disease.  Right Coronary Artery: Small, non-dominant vessel. No disease noted.  Left Ventricular Angiogram: LVEF 50-55%.  Impression:  1. Double vessel CAD with patent stent mid LAD and patent angioplasty site at ostium of intermediate Nohemi Nicklaus.  2. Normal LV systolic function    09/2011 Cath PROCEDURAL FINDINGS  Hemodynamics:  AO 135/92 with a mean of 114 mmHg  LV 136/25 mmHg  Coronary angiography:  Coronary dominance: Left  Left mainstem: Normal.  Left anterior descending (LAD): There is a 95% stenosis in the mid LAD immediately after the takeoff of the first diagonal. The first diagonal Lilliahna Schubring is moderate in size and appears normal. The LAD stenosis does appear to be hazy.  There is a moderately large ramus intermediate Talisha Erby which has a 95% ostial stenosis.  Left circumflex (LCx): This is a dominant vessel and appears normal throughout.  Right coronary artery (RCA): This is a small nondominant vessel and is normal.  Left ventriculography: Left ventricular systolic function is abnormal with apical  hypokinesis. LVEF is estimated at 55%, there is no significant mitral regurgitation  PCI Note: Following the diagnostic procedure, the decision was made to proceed with PCI of the LAD. Weight-based bivalirudin was given for anticoagulation. Once a therapeutic ACT was achieved, a 5 [place] EBU guide catheter was inserted. A pro-water coronary guidewire was used to cross the lesion. The lesion was predilated with a 2.5 mm balloon. The lesion was then stented with a 3.0 x 18 mm vision stent. The stent was postdilated with a 3.25 mm noncompliant balloon. Following PCI, there was 0% residual stenosis and TIMI-3 flow. At this point the patient's pain was improved but was Polak of moderate intensity. We then proceeded to intervene on the ostial ramus intermediate lesion. This was crossed with the prowater wire. This was dilated using a 3.0 x 10 mm cutting balloon  performing 3 inflations to 6 atmospheres. This showed a good angiographic result with less than 30% residual stenosis and TIMI grade 3 flow. Final angiography confirmed an excellent result. The patient tolerated the procedure well. There were no immediate procedural complications. A TR band was used for radial hemostasis. The patient was transferred to the post catheterization recovery area for further monitoring.  PCI Data:  Vessel - LAD/Segment - mid vessel immediately after the takeoff of the first diagonal.  Percent Stenosis (pre) 95%  TIMI-flow 3.  Stent 3.0 x 18 mm vision stent.  Percent Stenosis (post) 0%  TIMI-flow (post) 3.  Second vessel: Ostial ramus intermediate Howie Rufus  Percent stenosis: 95%.  TIMI flow 3.  3.0 mm cutting balloon  Percent stenosis (post) less than 30%  TIMI flow 3  Final Conclusions:  1. Severe 2 vessel obstructive coronary disease.  2. Overall well preserved LV systolic function with apical wall motion abnormality and ejection fraction of 55%.  3. Successful intracoronary stenting of the mid LAD with a bare-metal stent.  4. Successful cutting balloon angioplasty of the ramus intermediate Raiana Pharris.  Recommendations:  Continue therapy with Plavix. Patient reports a history of allergy to aspirin. Aggressive risk factor modification.  Disposition: Patient be observed in the intensive care unit tonight. If she has no complications she should be able to be transferred to telemetry in the morning.   12/2013 Cath HEMODYNAMICS:  Central Aorta: 100/60  Left Ventricle: 100/3  ANGIOGRAPHY:  1. Left main: Normal and trifurcated into an LAD, ramus intermediate vessel, and a dominant left circumflex coronary artery  2. LAD: Barbera Setters gave rise to a proximal large first diagonal vessel. The LAD just after the diagonal vessel had a widely patent stent. The remainder of the LAD was free of significant disease and extended to the LV apex. 3. Ramus  Intermediate: No evidence for restenosis at previous site of ostial cutting balloon intervention. 4. Left circumflex: Angiographically normal. Dominant vessel, which gave rise to 2 marginal branches and in the PDA.Marland Kitchen  4. Right coronary artery: Angiographically normal, nondominant vessel  Left ventriculography revealed global LV function. There was a suggestion of mild residual distal anterolateral hypocontractility. There was catheter-induced mitral regurgitation.  IMPRESSION:  Normal LV function with mild distal anterolateral, residual hypocontractility  No significant residual coronary obstructive disease with evidence for widely patent LAD stent, no evidence for restenosis of the ramus intermediate vessel, normal dominant left circumflex coronary artery and normal nondominant right coronary artery.    12/2013 MPI IMPRESSION: 1. Abnormal Lexiscan for ischemia  2. Large anteroseptal and apical area of ischemia  3. High risk study for major cardiac events based  on area of myocardium at [facility]  4. Normal left ventricular systolic function, left ventricular ejection fraction 52%    Assessment and Plan   1. CAD - no current symptoms, continue current meds - ok to stop plavix, no indication for continued use. Continue ASA  daily.  2. HTN - at goal, continue current meds  3. Hyperlipidemia - request lipid panel form pcp - continue currnet statin       Antoine Poche, M.D., F.A.C.C.

## 2016-02-19 ENCOUNTER — Encounter: Payer: Self-pay | Admitting: Cardiology

## 2016-02-19 ENCOUNTER — Ambulatory Visit (INDEPENDENT_AMBULATORY_CARE_PROVIDER_SITE_OTHER): Payer: PRIVATE HEALTH INSURANCE | Admitting: Cardiology

## 2016-02-19 VITALS — BP 113/80 | HR 97 | Ht 71.0 in | Wt >= 6400 oz

## 2016-02-19 DIAGNOSIS — I2510 Atherosclerotic heart disease of native coronary artery without angina pectoris: Secondary | ICD-10-CM | POA: Diagnosis not present

## 2016-02-19 DIAGNOSIS — R002 Palpitations: Secondary | ICD-10-CM | POA: Diagnosis not present

## 2016-02-19 DIAGNOSIS — E785 Hyperlipidemia, unspecified: Secondary | ICD-10-CM

## 2016-02-19 DIAGNOSIS — R7309 Other abnormal glucose: Secondary | ICD-10-CM | POA: Diagnosis not present

## 2016-02-19 DIAGNOSIS — R739 Hyperglycemia, unspecified: Secondary | ICD-10-CM

## 2016-02-19 MED ORDER — FUROSEMIDE 40 MG PO TABS: 40.0000 mg | ORAL_TABLET | Freq: Two times a day (BID) | ORAL | Status: DC

## 2016-02-19 NOTE — Patient Instructions (Addendum)
   Increase Lasix to 40mg  twice a day  - new sent to [facility] today. Continue all other medications.   Labs for CMET, Magnesium, CBC, FLP, HgA1C, TSH - orders given today - Reminder:  Nothing to eat or drink after 12 midnight prior to labs.  Office will contact with results via phone or letter.   Follow up in  3 months

## 2016-02-19 NOTE — Progress Notes (Signed)
Patient ID: Laurena Slimmer Denno, female   DOB: 1981/06/10, 35 y.o.   MRN: [id number]     Clinical Summary Ms. Trentman is a 35 y.o.female seen today for follow up of the following medical problems.   1. CAD - prior anterior MI 09/2011, 95% lesion in mid LAD and ramus with 95% ostial stenosis. LVEF 55% by LV gram, apical hypokinesis. BMS to mid LAD and cutting balloon angioplasty of ramus.  - repeat cath 02/2012 with patent vessels and stent - 09/2011 Echo: LVEF 50% - 12/2013 Lexiscan large anteroseptal ischemia - cath 12/2013 with patent vessels  - occasional sharp atypical chest pain. No other recent symptoms.   2. HTN - compliant with medications  3. Hyperlipidemia - compliant with statin  4. LE edema - increased edema. Weights up 20 pounds since our visit.  - not limiting sodium intake.    Past Medical History  Diagnosis Date  . Morbid obesity   . Migraines   . Hypertension   . Hyperlipidemia   . Anxiety   . Coronary artery disease 09/2011    a. Anterior MI. PCI and BMS to LAD and angioplsty to ostial Ramus;  b. 02/2012 Cath patent stent and ptca site, med rx.  . Diabetes mellitus      Allergies  Allergen Reactions  . Bee Venom Swelling  . Sulfa Drugs Cross Reactors Nausea And Vomiting     Current Outpatient Prescriptions  Medication Sig Dispense Refill  . aspirin 81 MG tablet Take 81 mg by mouth daily.    Marland Kitchen atorvastatin (LIPITOR) 80 MG tablet Take 80 mg by mouth daily.    . carvedilol (COREG) 12.5 MG tablet TAKE 1 TABLET BY MOUTH TWICE DAILY WITH A MEAL. 60 tablet 6  . escitalopram (LEXAPRO) 10 MG tablet Take 10 mg by mouth daily.    Marland Kitchen lisinopril (PRINIVIL,ZESTRIL) 2.5 MG tablet Take 1 tablet (2.5 mg total) by mouth daily. 30 tablet 6  . medroxyPROGESTERone (PROVERA) 5 MG tablet Take 5 mg by mouth daily.    . metFORMIN (GLUCOPHAGE) 500 MG tablet Take 1 tablet (500 mg total) by mouth 2 (two) times daily with a meal.    . nitroGLYCERIN (NITROSTAT) 0.4 MG SL tablet Place  0.4 mg under the tongue every 5 (five) minutes as needed for chest pain.    . pregabalin (LYRICA) 75 MG capsule Take 75 mg by mouth 2 (two) times daily.    . Skin Protectants, Misc. (EUCERIN) cream Apply 1 application topically as needed (dry hands).     . varenicline (CHANTIX) 1 MG tablet Take 1 mg by mouth 2 (two) times daily.     No current facility-administered medications for this visit.     Past Surgical History  Procedure Laterality Date  . Cholecystectomy    . Cesarean section    . Coronary angioplasty with stent placement  10/08/2011    LAD: BMS, Ramus: cutting balloon angioplasty  . Cardiac catheterization  10/08/2011    LAD: 95% mid, Ramus: 95% ostial  . Left heart catheterization with coronary angiogram N/A 10/08/2011    Procedure: LEFT HEART CATHETERIZATION WITH CORONARY ANGIOGRAM;  Surgeon: Peter M Swaziland, MD;  Location: [facility] CATH LAB;  Service: Cardiovascular;  Laterality: N/A;  . Left heart catheterization with coronary angiogram N/A 03/02/2012    Procedure: LEFT HEART CATHETERIZATION WITH CORONARY ANGIOGRAM;  Surgeon: Kathleene Hazel, MD;  Location: [facility] CATH LAB;  Service: Cardiovascular;  Laterality: N/A;  . Left heart catheterization with coronary angiogram N/A 01/04/2014  Procedure: LEFT HEART CATHETERIZATION WITH CORONARY ANGIOGRAM;  Surgeon: Lennette Bihari, MD;  Location: [facility] CATH LAB;  Service: Cardiovascular;  Laterality: N/A;     Allergies  Allergen Reactions  . Bee Venom Swelling  . Sulfa Drugs Cross Reactors Nausea And Vomiting      Family History  Problem Relation Age of Onset  . Heart attack Father      Social History Ms. Cortner reports that she has been smoking Cigarettes.  She has a 6 pack-year smoking history. She has never used smokeless tobacco. Ms. Eimer reports that she does not drink alcohol.   Review of Systems CONSTITUTIONAL: No weight loss, fever, chills, weakness or fatigue.  HEENT: Eyes: No visual loss, blurred vision, double  vision or yellow sclerae.No hearing loss, sneezing, congestion, runny nose or sore throat.  SKIN: No rash or itching.  CARDIOVASCULAR: per HPI RESPIRATORY: No shortness of breath, cough or sputum.  GASTROINTESTINAL: No anorexia, nausea, vomiting or diarrhea. No abdominal pain or blood.  GENITOURINARY: No burning on urination, no polyuria NEUROLOGICAL: No headache, dizziness, syncope, paralysis, ataxia, numbness or tingling in the extremities. No change in bowel or bladder control.  MUSCULOSKELETAL: No muscle, back pain, joint pain or stiffness.  LYMPHATICS: No enlarged nodes. No history of splenectomy.  PSYCHIATRIC: No history of depression or anxiety.  ENDOCRINOLOGIC: No reports of sweating, cold or heat intolerance. No polyuria or polydipsia.  Marland Kitchen   Physical Examination Filed Vitals:   02/19/16 1519  BP: 113/80  Pulse: 97   Filed Vitals:   02/19/16 1519  Height: 5\' 11"  (1.803 m)  Weight: 402 lb (182.346 kg)    Gen: resting comfortably, no acute distress HEENT: no scleral icterus, pupils equal round and reactive, no palptable cervical adenopathy,  CV: RRR, no m/r/g, no jvd Resp: Clear to auscultation bilaterally GI: abdomen is soft, non-tender, non-distended, normal bowel sounds, no hepatosplenomegaly MSK: extremities are warm, 1+ bilatral LE edema Skin: warm, no rash Neuro:  no focal deficits Psych: appropriate affect   Diagnostic Studies 02/2012 Cath Hemodynamic Findings:  Central aortic pressure: 108/70  Left ventricular pressure: 123/10/21  Angiographic Findings:  Left main: No obstructive disease noted.  Left Anterior Descending Artery: Large caliber vessel that courses to the apex. There is a stent present in the mid vessel that is widely patent with no restenosis. The remainder of the LAD is disease free. There is a moderate sized diagonal Ivis Nicolson with 30% proximal stenosis.  Circumflex Artery: Dominant, large caliber vessel with no disease throughout the AV  groove segment. There is small to moderate sized intermediate Kymberley Raz that has ostial 30% stenosis. The remainder of the Circumflex has no obstructive disease.  Right Coronary Artery: Small, non-dominant vessel. No disease noted.  Left Ventricular Angiogram: LVEF 50-55%.  Impression:  1. Double vessel CAD with patent stent mid LAD and patent angioplasty site at ostium of intermediate Abdishakur Gottschall.  2. Normal LV systolic function    09/2011 Cath PROCEDURAL FINDINGS  Hemodynamics:  AO 135/92 with a mean of 114 mmHg  LV 136/25 mmHg  Coronary angiography:  Coronary dominance: Left  Left mainstem: Normal.  Left anterior descending (LAD): There is a 95% stenosis in the mid LAD immediately after the takeoff of the first diagonal. The first diagonal Yarnell Kozloski is moderate in size and appears normal. The LAD stenosis does appear to be hazy.  There is a moderately large ramus intermediate Najla Aughenbaugh which has a 95% ostial stenosis.  Left circumflex (LCx): This is a dominant vessel and appears  normal throughout.  Right coronary artery (RCA): This is a small nondominant vessel and is normal.  Left ventriculography: Left ventricular systolic function is abnormal with apical hypokinesis. LVEF is estimated at 55%, there is no significant mitral regurgitation  PCI Note: Following the diagnostic procedure, the decision was made to proceed with PCI of the LAD. Weight-based bivalirudin was given for anticoagulation. Once a therapeutic ACT was achieved, a 5 [place] EBU guide catheter was inserted. A pro-water coronary guidewire was used to cross the lesion. The lesion was predilated with a 2.5 mm balloon. The lesion was then stented with a 3.0 x 18 mm vision stent. The stent was postdilated with a 3.25 mm noncompliant balloon. Following PCI, there was 0% residual stenosis and TIMI-3 flow. At this point the patient's pain was improved but was Brymer of moderate intensity. We then proceeded to intervene on the ostial  ramus intermediate lesion. This was crossed with the prowater wire. This was dilated using a 3.0 x 10 mm cutting balloon performing 3 inflations to 6 atmospheres. This showed a good angiographic result with less than 30% residual stenosis and TIMI grade 3 flow. Final angiography confirmed an excellent result. The patient tolerated the procedure well. There were no immediate procedural complications. A TR band was used for radial hemostasis. The patient was transferred to the post catheterization recovery area for further monitoring.  PCI Data:  Vessel - LAD/Segment - mid vessel immediately after the takeoff of the first diagonal.  Percent Stenosis (pre) 95%  TIMI-flow 3.  Stent 3.0 x 18 mm vision stent.  Percent Stenosis (post) 0%  TIMI-flow (post) 3.  Second vessel: Ostial ramus intermediate Georgann Bramble  Percent stenosis: 95%.  TIMI flow 3.  3.0 mm cutting balloon  Percent stenosis (post) less than 30%  TIMI flow 3  Final Conclusions:  1. Severe 2 vessel obstructive coronary disease.  2. Overall well preserved LV systolic function with apical wall motion abnormality and ejection fraction of 55%.  3. Successful intracoronary stenting of the mid LAD with a bare-metal stent.  4. Successful cutting balloon angioplasty of the ramus intermediate Mckinnley Cottier.  Recommendations:  Continue therapy with Plavix. Patient reports a history of allergy to aspirin. Aggressive risk factor modification.  Disposition: Patient be observed in the intensive care unit tonight. If she has no complications she should be able to be transferred to telemetry in the morning.   12/2013 Cath HEMODYNAMICS:  Central Aorta: 100/60  Left Ventricle: 100/3  ANGIOGRAPHY:  1. Left main: Normal and trifurcated into an LAD, ramus intermediate vessel, and a dominant left circumflex coronary artery  2. LAD: Barbera Setters gave rise to a proximal large first diagonal vessel. The LAD just after the diagonal vessel had a  widely patent stent. The remainder of the LAD was free of significant disease and extended to the LV apex. 3. Ramus Intermediate: No evidence for restenosis at previous site of ostial cutting balloon intervention. 4. Left circumflex: Angiographically normal. Dominant vessel, which gave rise to 2 marginal branches and in the PDA.Marland Kitchen  4. Right coronary artery: Angiographically normal, nondominant vessel  Left ventriculography revealed global LV function. There was a suggestion of mild residual distal anterolateral hypocontractility. There was catheter-induced mitral regurgitation.  IMPRESSION:  Normal LV function with mild distal anterolateral, residual hypocontractility  No significant residual coronary obstructive disease with evidence for widely patent LAD stent, no evidence for restenosis of the ramus intermediate vessel, normal dominant left circumflex coronary artery and normal nondominant right coronary artery.  12/2013 MPI IMPRESSION: 1. Abnormal Lexiscan for ischemia  2. Large anteroseptal and apical area of ischemia  3. High risk study for major cardiac events based on area of myocardium at [facility]  4. Normal left ventricular systolic function, left ventricular ejection fraction 52%      Assessment and Plan   1. CAD - no current symptoms, we will continue current meds - EKG shows NSR, no acute ischemic changes 2. HTN - at goal, we will continue current meds  3. Hyperlipidemia - repeat lipid panel - continue currnet statin   4. LE edema - increase lasix to 40mg  bid - repeat labs in 2 weeks.   F/u 3 months  Antoine Poche, M.D.

## 2016-02-26 DIAGNOSIS — I219 Acute myocardial infarction, unspecified: Secondary | ICD-10-CM

## 2016-02-26 HISTORY — DX: Acute myocardial infarction, unspecified: I21.9

## 2016-03-25 ENCOUNTER — Encounter: Payer: Self-pay | Admitting: *Deleted

## 2016-03-26 ENCOUNTER — Telehealth: Payer: Self-pay | Admitting: Cardiology

## 2016-03-26 NOTE — Telephone Encounter (Signed)
Patient's husband is asking to speak w/ nurse or Dr.Branch regarding patient being in [facility]. / tg

## 2016-03-26 NOTE — Telephone Encounter (Signed)
Spoke to pt's husband, he stated that his wife has had a "small MI" and is in [facility] and he is not pleased with the care they are getting thus far. He did say he may end up signing her out and taking her to [facility]. He just wanted to let us know what was going on. I did tell him to call us to keep [place] updated on pt.

## 2016-03-26 NOTE — Telephone Encounter (Signed)
Let pt husband know to keep [place] updated. Also to ask for her records upon her discharge or sign a release so we can get a copy of them.

## 2016-03-26 NOTE — Telephone Encounter (Signed)
Please let patient know that I am glad they are taking her to cath and to keep [place] posted on her progress. She will need a close f/u with [place] after discharge.   Dominga Ferry MD

## 2016-03-29 NOTE — Telephone Encounter (Signed)
-----   Message from Dyane Dustman sent at 03/29/2016  9:42 AM EDT ----- Regarding: RE: same day appt.  Thursday 04/01/16 @ 2pm.  Please advise patient.  Thanks, Aurther Loft ----- Message -----    From: Fonnie Birkenhead, CMA    Sent: 03/29/2016   9:24 AM      To: Virgel Gess Goins Subject: same day appt.                                 Could you please schedule a same day wit Dr. Wyline Mood for this pt. On either Wed or Thurs after 1 this week. Please,.. Misty Stanley

## 2016-03-29 NOTE — Telephone Encounter (Signed)
Called pt and left a detailed message of appointment 04/01/16 @ 2 with Dr. Wyline Mood.

## 2016-03-31 ENCOUNTER — Ambulatory Visit (INDEPENDENT_AMBULATORY_CARE_PROVIDER_SITE_OTHER): Payer: PRIVATE HEALTH INSURANCE | Admitting: Cardiology

## 2016-03-31 ENCOUNTER — Encounter: Payer: Self-pay | Admitting: Cardiology

## 2016-03-31 VITALS — BP 122/90 | HR 91 | Ht 69.0 in | Wt 390.0 lb

## 2016-03-31 DIAGNOSIS — I10 Essential (primary) hypertension: Secondary | ICD-10-CM

## 2016-03-31 DIAGNOSIS — E785 Hyperlipidemia, unspecified: Secondary | ICD-10-CM | POA: Diagnosis not present

## 2016-03-31 DIAGNOSIS — Z79899 Other long term (current) drug therapy: Secondary | ICD-10-CM

## 2016-03-31 DIAGNOSIS — I2510 Atherosclerotic heart disease of native coronary artery without angina pectoris: Secondary | ICD-10-CM

## 2016-03-31 MED ORDER — CARVEDILOL 25 MG PO TABS: 25.0000 mg | ORAL_TABLET | Freq: Two times a day (BID) | ORAL | Status: DC

## 2016-03-31 MED ORDER — TORSEMIDE 20 MG PO TABS
40.0000 mg | ORAL_TABLET | Freq: Every day | ORAL | Status: DC
Start: 2016-03-31 — End: 2016-10-04

## 2016-03-31 MED ORDER — CLOPIDOGREL BISULFATE 75 MG PO TABS: 75.0000 mg | ORAL_TABLET | Freq: Every day | ORAL | Status: DC

## 2016-03-31 NOTE — Patient Instructions (Signed)
Your physician recommends that you schedule a follow-up appointment in: 1 Month with Dr. Wyline Mood  Your physician recommends that you return for lab work in: CMET, MG  If you need a refill on your cardiac medications before your next appointment, please call your pharmacy.  Thank you for choosing [facility]!

## 2016-03-31 NOTE — Progress Notes (Signed)
Clinical Summary [name] is a 35 y.o.female seen today for follow up of the following medical problems  1. CAD - prior anterior MI 09/2011, 95% lesion in mid LAD and ramus with 95% ostial stenosis. LVEF 55% by LV gram, apical hypokinesis. BMS to mid LAD and cutting balloon angioplasty of ramus.  - repeat cath 02/2012 with patent vessels and stent - 09/2011 Echo: LVEF 50% - 12/2013 Lexiscan large anteroseptal ischemia - cath 12/2013 with patent vessels   - admit to [facility] last week with chest pain - peak troponin of 17 - cath 02/2016 [facility] showed LM patent, LAD patent with patent stent, distal LAD 30-40%, LCX patent, RCA 30-40%. LVEDP 40, PCWP 30 mean PA 32, CI 4.93 - echo 02/2016 [place] LVEF 50%, apical akinesis -started on torsemide 40mg  daily. Started on plavix 75mg  daily at discharge.  - elevated LFTs, statin held at discharge   2. HTN - compliant with medications. Coreg increased during recent admission.   3. Hyperlipidemia - statin held after recent admission with mildly elevated LFTs, appears she resumed taking.       SH: plans to move to [facility] this Friday.  Past Medical History  Diagnosis Date  . Morbid obesity (HCC)   . Migraines   . Hypertension   . Hyperlipidemia   . Anxiety   . Coronary artery disease 09/2011    a. Anterior MI. PCI and BMS to LAD and angioplsty to ostial Ramus;  b. 02/2012 Cath patent stent and ptca site, med rx.  . Diabetes mellitus      Allergies  Allergen Reactions  . Bee Venom Swelling  . Sulfa Drugs Cross Reactors Nausea And Vomiting     Current Outpatient Prescriptions  Medication Sig Dispense Refill  . aspirin 81 MG tablet Take 81 mg by mouth daily.    Marland Kitchen atorvastatin (LIPITOR) 80 MG tablet Take 80 mg by mouth daily.    Marland Kitchen buPROPion (WELLBUTRIN XL) 150 MG 24 hr tablet Take 1 tablet by mouth daily.    . carvedilol (COREG) 12.5 MG tablet TAKE 1 TABLET BY MOUTH TWICE DAILY WITH A MEAL. 60 tablet 6  .  furosemide (LASIX) 40 MG tablet Take 1 tablet (40 mg total) by mouth 2 (two) times daily. 60 tablet 6  . hydrOXYzine (VISTARIL) 25 MG capsule Take 1 capsule by mouth daily as needed.  0  . lisinopril (PRINIVIL,ZESTRIL) 2.5 MG tablet Take 1 tablet (2.5 mg total) by mouth daily. 30 tablet 6  . medroxyPROGESTERone (PROVERA) 5 MG tablet Take 5 mg by mouth daily.    . metFORMIN (GLUCOPHAGE) 1000 MG tablet Take 1 tablet by mouth 2 (two) times daily.  5  . nitroGLYCERIN (NITROSTAT) 0.4 MG SL tablet Place 0.4 mg under the tongue every 5 (five) minutes as needed for chest pain.    . potassium chloride SA (K-DUR,KLOR-CON) 20 MEQ tablet Take 1 tablet by mouth daily.    . pregabalin (LYRICA) 75 MG capsule Take 75 mg by mouth 2 (two) times daily.     No current facility-administered medications for this visit.     Past Surgical History  Procedure Laterality Date  . Cholecystectomy    . Cesarean section    . Coronary angioplasty with stent placement  10/08/2011    LAD: BMS, Ramus: cutting balloon angioplasty  . Cardiac catheterization  10/08/2011    LAD: 95% mid, Ramus: 95% ostial  . Left heart catheterization with coronary angiogram N/A 10/08/2011    Procedure:  LEFT HEART CATHETERIZATION WITH CORONARY ANGIOGRAM;  Surgeon: Peter M Swaziland, MD;  Location: [facility] CATH LAB;  Service: Cardiovascular;  Laterality: N/A;  . Left heart catheterization with coronary angiogram N/A 03/02/2012    Procedure: LEFT HEART CATHETERIZATION WITH CORONARY ANGIOGRAM;  Surgeon: Kathleene Hazel, MD;  Location: [facility] CATH LAB;  Service: Cardiovascular;  Laterality: N/A;  . Left heart catheterization with coronary angiogram N/A 01/04/2014    Procedure: LEFT HEART CATHETERIZATION WITH CORONARY ANGIOGRAM;  Surgeon: Lennette Bihari, MD;  Location: [facility] CATH LAB;  Service: Cardiovascular;  Laterality: N/A;     Allergies  Allergen Reactions  . Bee Venom Swelling  . Sulfa Drugs Cross Reactors Nausea And Vomiting      Family History    Problem Relation Age of Onset  . Heart attack Father      Social History Ms. Gheen reports that she has been smoking Cigarettes.  She started smoking about 26 years ago. She has a 10 pack-year smoking history. She has never used smokeless tobacco. Ms. Kronenberger reports that she does not drink alcohol.   Review of Systems CONSTITUTIONAL: No weight loss, fever, chills, weakness or fatigue.  HEENT: Eyes: No visual loss, blurred vision, double vision or yellow sclerae.No hearing loss, sneezing, congestion, runny nose or sore throat.  SKIN: No rash or itching.  CARDIOVASCULAR:  RESPIRATORY: No shortness of breath, cough or sputum.  GASTROINTESTINAL: No anorexia, nausea, vomiting or diarrhea. No abdominal pain or blood.  GENITOURINARY: No burning on urination, no polyuria NEUROLOGICAL: No headache, dizziness, syncope, paralysis, ataxia, numbness or tingling in the extremities. No change in bowel or bladder control.  MUSCULOSKELETAL: No muscle, back pain, joint pain or stiffness.  LYMPHATICS: No enlarged nodes. No history of splenectomy.  PSYCHIATRIC: No history of depression or anxiety.  ENDOCRINOLOGIC: No reports of sweating, cold or heat intolerance. No polyuria or polydipsia.  Marland Kitchen   Physical Examination Filed Vitals:   03/31/16 1400  BP: 122/90  Pulse: 91   Filed Vitals:   03/31/16 1400  Height: 5\' 9"  (1.753 m)  Weight: 390 lb (176.903 kg)    Gen: resting comfortably, no acute distress HEENT: no scleral icterus, pupils equal round and reactive, no palptable cervical adenopathy,  CV: RRR, no m/r/g, no jvd Resp: Clear to auscultation bilaterally GI: abdomen is soft, non-tender, non-distended, normal bowel sounds, no hepatosplenomegaly MSK: extremities are warm, no edema.  Skin: warm, no rash Neuro:  no focal deficits Psych: appropriate affect   Diagnostic Studies 02/2012 Cath Hemodynamic Findings:  Central aortic pressure: 108/70  Left ventricular pressure: 123/10/21   Angiographic Findings:  Left main: No obstructive disease noted.  Left Anterior Descending Artery: Large caliber vessel that courses to the apex. There is a stent present in the mid vessel that is widely patent with no restenosis. The remainder of the LAD is disease free. There is a moderate sized diagonal Honest Safranek with 30% proximal stenosis.  Circumflex Artery: Dominant, large caliber vessel with no disease throughout the AV groove segment. There is small to moderate sized intermediate Shiva Karis that has ostial 30% stenosis. The remainder of the Circumflex has no obstructive disease.  Right Coronary Artery: Small, non-dominant vessel. No disease noted.  Left Ventricular Angiogram: LVEF 50-55%.  Impression:  1. Double vessel CAD with patent stent mid LAD and patent angioplasty site at ostium of intermediate Mainor Hellmann.  2. Normal LV systolic function    09/2011 Cath PROCEDURAL FINDINGS  Hemodynamics:  AO 135/92 with a mean of 114 mmHg  LV 136/25  mmHg  Coronary angiography:  Coronary dominance: Left  Left mainstem: Normal.  Left anterior descending (LAD): There is a 95% stenosis in the mid LAD immediately after the takeoff of the first diagonal. The first diagonal Darothy Courtright is moderate in size and appears normal. The LAD stenosis does appear to be hazy.  There is a moderately large ramus intermediate [place] Gustafson which has a 95% ostial stenosis.  Left circumflex (LCx): This is a dominant vessel and appears normal throughout.  Right coronary artery (RCA): This is a small nondominant vessel and is normal.  Left ventriculography: Left ventricular systolic function is abnormal with apical hypokinesis. LVEF is estimated at 55%, there is no significant mitral regurgitation  PCI Note: Following the diagnostic procedure, the decision was made to proceed with PCI of the LAD. Weight-based bivalirudin was given for anticoagulation. Once a therapeutic ACT was achieved, a 5 [place] EBU guide catheter  was inserted. A pro-water coronary guidewire was used to cross the lesion. The lesion was predilated with a 2.5 mm balloon. The lesion was then stented with a 3.0 x 18 mm vision stent. The stent was postdilated with a 3.25 mm noncompliant balloon. Following PCI, there was 0% residual stenosis and TIMI-3 flow. At this point the patient's pain was improved but was Bossi of moderate intensity. We then proceeded to intervene on the ostial ramus intermediate lesion. This was crossed with the prowater wire. This was dilated using a 3.0 x 10 mm cutting balloon performing 3 inflations to 6 atmospheres. This showed a good angiographic result with less than 30% residual stenosis and TIMI grade 3 flow. Final angiography confirmed an excellent result. The patient tolerated the procedure well. There were no immediate procedural complications. A TR band was used for radial hemostasis. The patient was transferred to the post catheterization recovery area for further monitoring.  PCI Data:  Vessel - LAD/Segment - mid vessel immediately after the takeoff of the first diagonal.  Percent Stenosis (pre) 95%  TIMI-flow 3.  Stent 3.0 x 18 mm vision stent.  Percent Stenosis (post) 0%  TIMI-flow (post) 3.  Second vessel: Ostial ramus intermediate Chike Farrington  Percent stenosis: 95%.  TIMI flow 3.  3.0 mm cutting balloon  Percent stenosis (post) less than 30%  TIMI flow 3  Final Conclusions:  1. Severe 2 vessel obstructive coronary disease.  2. Overall well preserved LV systolic function with apical wall motion abnormality and ejection fraction of 55%.  3. Successful intracoronary stenting of the mid LAD with a bare-metal stent.  4. Successful cutting balloon angioplasty of the ramus intermediate Nikira Kushnir.  Recommendations:  Continue therapy with Plavix. Patient reports a history of allergy to aspirin. Aggressive risk factor modification.  Disposition: Patient be observed in the intensive care unit tonight.  If she has no complications she should be able to be transferred to telemetry in the morning.   12/2013 Cath HEMODYNAMICS:  Central Aorta: 100/60  Left Ventricle: 100/3  ANGIOGRAPHY:  1. Left main: Normal and trifurcated into an LAD, ramus intermediate vessel, and a dominant left circumflex coronary artery  2. LAD: Barbera Setters gave rise to a proximal large first diagonal vessel. The LAD just after the diagonal vessel had a widely patent stent. The remainder of the LAD was free of significant disease and extended to the LV apex. 3. Ramus Intermediate: No evidence for restenosis at previous site of ostial cutting balloon intervention. 4. Left circumflex: Angiographically normal. Dominant vessel, which gave rise to 2 marginal branches and in the PDA.Marland Kitchen  4.  Right coronary artery: Angiographically normal, nondominant vessel  Left ventriculography revealed global LV function. There was a suggestion of mild residual distal anterolateral hypocontractility. There was catheter-induced mitral regurgitation.  IMPRESSION:  Normal LV function with mild distal anterolateral, residual hypocontractility  No significant residual coronary obstructive disease with evidence for widely patent LAD stent, no evidence for restenosis of the ramus intermediate vessel, normal dominant left circumflex coronary artery and normal nondominant right coronary artery.    12/2013 MPI IMPRESSION: 1. Abnormal Lexiscan for ischemia  2. Large anteroseptal and apical area of ischemia  3. High risk study for major cardiac events based on area of myocardium at [facility]  4. Normal left ventricular systolic function, left ventricular ejection fraction 52%     Assessment and Plan  1. CAD - recent NSTEMI with admission to [facility], cath without obstructive CAD. Elevated LVEDP perhaps played some role - continue DAPT for 1 year for medical management of NSTEMI. Increased diuresis with torsemide.  - continue  current meds   2. HTN - at goal, we will continue current therapy.   3. Hyperlipidemia - repeat liver tests, mildly elevated during recent admission. I suspect possible venous congestion given very high wedge and LVEDP. Resume statin.   4. LE edema - continue torsemide, counseled ok to take additional as needed.       Antoine Poche, M.D.

## 2016-04-01 ENCOUNTER — Encounter: Payer: PRIVATE HEALTH INSURANCE | Admitting: Cardiology

## 2016-04-12 ENCOUNTER — Other Ambulatory Visit: Payer: Self-pay | Admitting: Cardiology

## 2016-04-12 NOTE — Telephone Encounter (Signed)
Pt has no scale, will take 20 mg now and see how that works, swelling has gone down quite a bit in ankles, will buy scale soon.

## 2016-04-12 NOTE — Telephone Encounter (Signed)
Husband says since wife started torsemide she has constipation and stomach cramps.They request another drug.They state it is a side effect and she has great diuresis  but cannot handle side effects.

## 2016-04-12 NOTE — Telephone Encounter (Signed)
Patient's husband states that patient is having side effects to fluid pill / tg

## 2016-04-12 NOTE — Telephone Encounter (Signed)
I would suggest decreased the dose or the frequency instead of changing the medication, as lasix did not seem to work well for her. Can try taking torsemide 20mg  daily instead of 40mg  daily, if symptoms continue can change to 20mg  every other day. What are her home weights and how is her swelling?   Dominga Ferry MD

## 2016-05-12 ENCOUNTER — Ambulatory Visit: Payer: PRIVATE HEALTH INSURANCE | Admitting: Cardiology

## 2016-05-19 ENCOUNTER — Encounter: Payer: Self-pay | Admitting: *Deleted

## 2016-05-20 ENCOUNTER — Ambulatory Visit: Payer: PRIVATE HEALTH INSURANCE | Admitting: Cardiology

## 2016-06-10 NOTE — Progress Notes (Signed)
This encounter was created in error - please disregard.

## 2016-06-24 NOTE — Progress Notes (Signed)
This encounter was created in error - please disregard.

## 2016-06-28 ENCOUNTER — Ambulatory Visit: Payer: PRIVATE HEALTH INSURANCE | Admitting: Cardiology

## 2016-07-15 LAB — COMPREHENSIVE METABOLIC PANEL
ALT: 24 U/L (ref 6–29)
AST: 22 U/L (ref 10–30)
Albumin: 3.7 g/dL (ref 3.6–5.1)
Alkaline Phosphatase: 91 U/L (ref 33–115)
BUN: 9 mg/dL (ref 7–25)
CO2: 23 mmol/L (ref 20–31)
Calcium: 9 mg/dL (ref 8.6–10.2)
Chloride: 99 mmol/L (ref 98–110)
Creat: 0.75 mg/dL (ref 0.50–1.10)
Glucose, Bld: 144 mg/dL — ABNORMAL HIGH (ref 65–99)
Potassium: 3.8 mmol/L (ref 3.5–5.3)
Sodium: 136 mmol/L (ref 135–146)
Total Bilirubin: 0.5 mg/dL (ref 0.2–1.2)
Total Protein: 7.3 g/dL (ref 6.1–8.1)

## 2016-07-15 LAB — MAGNESIUM: Magnesium: 1.7 mg/dL (ref 1.5–2.5)

## 2016-07-21 ENCOUNTER — Ambulatory Visit (INDEPENDENT_AMBULATORY_CARE_PROVIDER_SITE_OTHER): Payer: Medicaid - Out of State | Admitting: Cardiology

## 2016-07-21 ENCOUNTER — Encounter: Payer: Self-pay | Admitting: Cardiology

## 2016-07-21 VITALS — BP 107/75 | HR 88 | Ht 69.0 in | Wt 375.6 lb

## 2016-07-21 DIAGNOSIS — R600 Localized edema: Secondary | ICD-10-CM

## 2016-07-21 DIAGNOSIS — I2510 Atherosclerotic heart disease of native coronary artery without angina pectoris: Secondary | ICD-10-CM

## 2016-07-21 DIAGNOSIS — E782 Mixed hyperlipidemia: Secondary | ICD-10-CM

## 2016-07-21 DIAGNOSIS — R059 Cough, unspecified: Secondary | ICD-10-CM

## 2016-07-21 DIAGNOSIS — I10 Essential (primary) hypertension: Secondary | ICD-10-CM

## 2016-07-21 DIAGNOSIS — R05 Cough: Secondary | ICD-10-CM

## 2016-07-21 MED ORDER — LOSARTAN POTASSIUM 25 MG PO TABS: 12.5000 mg | ORAL_TABLET | Freq: Every day | ORAL | 3 refills | Status: DC

## 2016-07-21 NOTE — Patient Instructions (Signed)
Your physician recommends that you schedule a follow-up appointment in: 3 MONTHS WITH DR. BRANCH   Your physician has recommended you make the following change in your medication:   STOP LISINOPRIL   START LOSARTAN 12.5 MG DAILY   Your physician recommends that you return for lab work in: 1 MONTH BMP/MG  Thank you for choosing [facility]!!

## 2016-07-21 NOTE — Progress Notes (Signed)
Clinical Summary Ms. Hollingsworth is a 35 y.o.female seen today for follow up of the following medical problems  1. CAD - prior anterior MI 09/2011, 95% lesion in mid LAD and ramus with 95% ostial stenosis. LVEF 55% by LV gram, apical hypokinesis. BMS to mid LAD and cutting balloon angioplasty of ramus.  - repeat cath 02/2012 with patent vessels and stent - 09/2011 Echo: LVEF 50% - 12/2013 Lexiscan large anteroseptal ischemia - cath 12/2013 with patent vessels   - admit to [facility] last week with chest pain - peak troponin of 17 - cath 02/2016 [facility] showed LM patent, LAD patent with patent stent, distal LAD 30-40%, LCX patent, RCA 30-40%. LVEDP 40, PCWP 30 mean PA 32, CI 4.93 - echo 02/2016 [place] LVEF 50%, apical akinesis -started on torsemide 40mg  daily. Started on plavix 75mg  daily at discharge.  - elevated LFTs, statin held at discharge   - weight down 15 lbs since last visit. Has some occasional LE edema but overall improving. No recent chest pain.  - compliant with torsemide.   2. HTN - compliant with medications  3. Hyperlipidemia - compliant with statin  4. LE edema - on torsemide, overall improving.   5. Cough - she attributes to lisionpril, ongoing for several months   Past Medical History:  Diagnosis Date  . Anxiety   . Coronary artery disease 09/2011   a. Anterior MI. PCI and BMS to LAD and angioplsty to ostial Ramus;  b. 02/2012 Cath patent stent and ptca site, med rx.  . Diabetes mellitus   . Hyperlipidemia   . Hypertension   . Migraines   . Morbid obesity (HCC)      Allergies  Allergen Reactions  . Bee Venom Swelling  . Sulfa Drugs Cross Reactors Nausea And Vomiting     Current Outpatient Prescriptions  Medication Sig Dispense Refill  . aspirin 81 MG tablet Take 81 mg by mouth daily.    Marland Kitchen atorvastatin (LIPITOR) 80 MG tablet Take 80 mg by mouth daily.    Marland Kitchen buPROPion (WELLBUTRIN XL) 150 MG 24 hr tablet Take 1 tablet by mouth daily.     . carvedilol (COREG) 25 MG tablet Take 1 tablet (25 mg total) by mouth 2 (two) times daily with a meal. 60 tablet 6  . clopidogrel (PLAVIX) 75 MG tablet Take 1 tablet (75 mg total) by mouth daily. 30 tablet 6  . hydrOXYzine (VISTARIL) 25 MG capsule Take 1 capsule by mouth daily as needed.  0  . lisinopril (PRINIVIL,ZESTRIL) 2.5 MG tablet Take 1 tablet (2.5 mg total) by mouth daily. 30 tablet 6  . medroxyPROGESTERone (PROVERA) 5 MG tablet Take 5 mg by mouth daily.    . metFORMIN (GLUCOPHAGE) 1000 MG tablet Take 1 tablet by mouth 2 (two) times daily.  5  . nitroGLYCERIN (NITROSTAT) 0.4 MG SL tablet Place 0.4 mg under the tongue every 5 (five) minutes as needed for chest pain.    . potassium chloride SA (K-DUR,KLOR-CON) 20 MEQ tablet Take 1 tablet by mouth daily.    . pregabalin (LYRICA) 75 MG capsule Take 75 mg by mouth 2 (two) times daily.    Marland Kitchen torsemide (DEMADEX) 20 MG tablet Take 2 tablets (40 mg total) by mouth daily. (Patient taking differently: Take 40 mg by mouth daily. 04/12/16 decrease to 20 mg daily per KL, NP) 60 tablet 6   No current facility-administered medications for this visit.      Past Surgical History:  Procedure Laterality  Date  . CARDIAC CATHETERIZATION  10/08/2011   LAD: 95% mid, Ramus: 95% ostial  . CESAREAN SECTION    . CHOLECYSTECTOMY    . CORONARY ANGIOPLASTY WITH STENT PLACEMENT  10/08/2011   LAD: BMS, Ramus: cutting balloon angioplasty  . LEFT HEART CATHETERIZATION WITH CORONARY ANGIOGRAM N/A 10/08/2011   Procedure: LEFT HEART CATHETERIZATION WITH CORONARY ANGIOGRAM;  Surgeon: Peter M Swaziland, MD;  Location: [facility] CATH LAB;  Service: Cardiovascular;  Laterality: N/A;  . LEFT HEART CATHETERIZATION WITH CORONARY ANGIOGRAM N/A 03/02/2012   Procedure: LEFT HEART CATHETERIZATION WITH CORONARY ANGIOGRAM;  Surgeon: Kathleene Hazel, MD;  Location: [facility] CATH LAB;  Service: Cardiovascular;  Laterality: N/A;  . LEFT HEART CATHETERIZATION WITH CORONARY ANGIOGRAM N/A 01/04/2014     Procedure: LEFT HEART CATHETERIZATION WITH CORONARY ANGIOGRAM;  Surgeon: Lennette Bihari, MD;  Location: [facility] CATH LAB;  Service: Cardiovascular;  Laterality: N/A;     Allergies  Allergen Reactions  . Bee Venom Swelling  . Sulfa Drugs Cross Reactors Nausea And Vomiting      Family History  Problem Relation Age of Onset  . Heart attack Father      Social History Ms. Jourdan reports that she has been smoking Cigarettes.  She started smoking about 26 years ago. She has a 10.00 pack-year smoking history. She has never used smokeless tobacco. Ms. Gouveia reports that she does not drink alcohol.   Review of Systems CONSTITUTIONAL: No weight loss, fever, chills, weakness or fatigue.  HEENT: Eyes: No visual loss, blurred vision, double vision or yellow sclerae.No hearing loss, sneezing, congestion, runny nose or sore throat.  SKIN: No rash or itching.  CARDIOVASCULAR: per HPI RESPIRATORY: per HPI GASTROINTESTINAL: No anorexia, nausea, vomiting or diarrhea. No abdominal pain or blood.  GENITOURINARY: No burning on urination, no polyuria NEUROLOGICAL: No headache, dizziness, syncope, paralysis, ataxia, numbness or tingling in the extremities. No change in bowel or bladder control.  MUSCULOSKELETAL: No muscle, back pain, joint pain or stiffness.  LYMPHATICS: No enlarged nodes. No history of splenectomy.  PSYCHIATRIC: No history of depression or anxiety.  ENDOCRINOLOGIC: No reports of sweating, cold or heat intolerance. No polyuria or polydipsia.  Marland Kitchen   Physical Examination Vitals:   07/21/16 1336  BP: 107/75  Pulse: 88   Vitals:   07/21/16 1336  Weight: (!) 375 lb 9.6 oz (170.4 kg)  Height: 5\' 9"  (1.753 m)    Gen: resting comfortably, no acute distress HEENT: no scleral icterus, pupils equal round and reactive, no palptable cervical adenopathy,  CV: RRR, no m/r/g, no jvd Resp: Clear to auscultation bilaterally GI: abdomen is soft, non-tender, non-distended, normal bowel sounds,  no hepatosplenomegaly MSK: extremities are warm, no edema.  Skin: warm, no rash Neuro:  no focal deficits Psych: appropriate affect   Diagnostic Studies 02/2012 Cath Hemodynamic Findings:  Central aortic pressure: 108/70  Left ventricular pressure: 123/10/21  Angiographic Findings:  Left main: No obstructive disease noted.  Left Anterior Descending Artery: Large caliber vessel that courses to the apex. There is a stent present in the mid vessel that is widely patent with no restenosis. The remainder of the LAD is disease free. There is a moderate sized diagonal Jameelah Watts with 30% proximal stenosis.  Circumflex Artery: Dominant, large caliber vessel with no disease throughout the AV groove segment. There is small to moderate sized intermediate Lakeyia Surber that has ostial 30% stenosis. The remainder of the Circumflex has no obstructive disease.  Right Coronary Artery: Small, non-dominant vessel. No disease noted.  Left Ventricular Angiogram:  LVEF 50-55%.  Impression:  1. Double vessel CAD with patent stent mid LAD and patent angioplasty site at ostium of intermediate Taliya Mcclard.  2. Normal LV systolic function    09/2011 Cath PROCEDURAL FINDINGS  Hemodynamics:  AO 135/92 with a mean of 114 mmHg  LV 136/25 mmHg  Coronary angiography:  Coronary dominance: Left  Left mainstem: Normal.  Left anterior descending (LAD): There is a 95% stenosis in the mid LAD immediately after the takeoff of the first diagonal. The first diagonal Keaton Stirewalt is moderate in size and appears normal. The LAD stenosis does appear to be hazy.  There is a moderately large ramus intermediate Roopa Graver which has a 95% ostial stenosis.  Left circumflex (LCx): This is a dominant vessel and appears normal throughout.  Right coronary artery (RCA): This is a small nondominant vessel and is normal.  Left ventriculography: Left ventricular systolic function is abnormal with apical hypokinesis. LVEF is estimated at 55%,  there is no significant mitral regurgitation  PCI Note: Following the diagnostic procedure, the decision was made to proceed with PCI of the LAD. Weight-based bivalirudin was given for anticoagulation. Once a therapeutic ACT was achieved, a 5 [place] EBU guide catheter was inserted. A pro-water coronary guidewire was used to cross the lesion. The lesion was predilated with a 2.5 mm balloon. The lesion was then stented with a 3.0 x 18 mm vision stent. The stent was postdilated with a 3.25 mm noncompliant balloon. Following PCI, there was 0% residual stenosis and TIMI-3 flow. At this point the patient's pain was improved but was [place] of moderate intensity. We then proceeded to intervene on the ostial ramus intermediate lesion. This was crossed with the prowater wire. This was dilated using a 3.0 x 10 mm cutting balloon performing 3 inflations to 6 atmospheres. This showed a good angiographic result with less than 30% residual stenosis and TIMI grade 3 flow. Final angiography confirmed an excellent result. The patient tolerated the procedure well. There were no immediate procedural complications. A TR band was used for radial hemostasis. The patient was transferred to the post catheterization recovery area for further monitoring.  PCI Data:  Vessel - LAD/Segment - mid vessel immediately after the takeoff of the first diagonal.  Percent Stenosis (pre) 95%  TIMI-flow 3.  Stent 3.0 x 18 mm vision stent.  Percent Stenosis (post) 0%  TIMI-flow (post) 3.  Second vessel: Ostial ramus intermediate Evens Meno  Percent stenosis: 95%.  TIMI flow 3.  3.0 mm cutting balloon  Percent stenosis (post) less than 30%  TIMI flow 3  Final Conclusions:  1. Severe 2 vessel obstructive coronary disease.  2. Overall well preserved LV systolic function with apical wall motion abnormality and ejection fraction of 55%.  3. Successful intracoronary stenting of the mid LAD with a bare-metal stent.  4. Successful  cutting balloon angioplasty of the ramus intermediate Dorell Gatlin.  Recommendations:  Continue therapy with Plavix. Patient reports a history of allergy to aspirin. Aggressive risk factor modification.  Disposition: Patient be observed in the intensive care unit tonight. If she has no complications she should be able to be transferred to telemetry in the morning.   12/2013 Cath HEMODYNAMICS:  Central Aorta: 100/60  Left Ventricle: 100/3  ANGIOGRAPHY:  1. Left main: Normal and trifurcated into an LAD, ramus intermediate vessel, and a dominant left circumflex coronary artery  2. LAD: Barbera Setters gave rise to a proximal large first diagonal vessel. The LAD just after the diagonal vessel had a widely patent stent. The  remainder of the LAD was free of significant disease and extended to the LV apex. 3. Ramus Intermediate: No evidence for restenosis at previous site of ostial cutting balloon intervention. 4. Left circumflex: Angiographically normal. Dominant vessel, which gave rise to 2 marginal branches and in the PDA.Marland Kitchen.  4. Right coronary artery: Angiographically normal, nondominant vessel  Left ventriculography revealed global LV function. There was a suggestion of mild residual distal anterolateral hypocontractility. There was catheter-induced mitral regurgitation.  IMPRESSION:  Normal LV function with mild distal anterolateral, residual hypocontractility  No significant residual coronary obstructive disease with evidence for widely patent LAD stent, no evidence for restenosis of the ramus intermediate vessel, normal dominant left circumflex coronary artery and normal nondominant right coronary artery.    12/2013 MPI IMPRESSION: 1. Abnormal Lexiscan for ischemia  2. Large anteroseptal and apical area of ischemia  3. High risk study for major cardiac events based [facility]  4. Normal left ventricular systolic function, left  ventricular ejection fraction 52%     Assessment and Plan  1. CAD [facility] without obstructive CAD. Elevated LVEDP perhaps played some role - continue DAPT for 1 year for medical management of NSTEMI. - no recent symptoms, continue current meds   2. HTN - she is at goal, we will continue current therapy.   3. Hyperlipidemia - continue statin  4. LE edema - improving with torsemide, we will continue. Check BMET/Mg  5. Cough - possibly ACE-I cough, stop lisionpril and start losartan 12.5mg  daily.    F/u 3 months  Antoine PocheJonathan F. Tallula Grindle, M.D.

## 2016-10-04 ENCOUNTER — Other Ambulatory Visit: Payer: Self-pay | Admitting: Cardiology

## 2016-10-26 ENCOUNTER — Ambulatory Visit: Payer: PRIVATE HEALTH INSURANCE | Admitting: Cardiology

## 2016-10-26 NOTE — Progress Notes (Deleted)
Clinical Summary [name] is a 36 y.o.female  1. CAD - prior anterior MI 09/2011, 95% lesion in mid LAD and ramus with 95% ostial stenosis. LVEF 55% by LV gram, apical hypokinesis. BMS to mid LAD and cutting balloon angioplasty of ramus.  - repeat cath 02/2012 with patent vessels and stent - 09/2011 Echo: LVEF 50% - 12/2013 Lexiscan large anteroseptal ischemia - cath 12/2013 with patent vessels   - admit to [facility] last week with chest pain - peak troponin of 17 - cath 02/2016 [facility] showed LM patent, LAD patent with patent stent, distal LAD 30-40%, LCX patent, RCA 30-40%. LVEDP 40, PCWP 30 mean PA 32, CI 4.93 - echo 02/2016 [place] LVEF 50%, apical akinesis -started on torsemide 40mg  daily. Started on plavix 75mg  daily at discharge.  - elevated LFTs, statin held at discharge   - weight down 15 lbs since last visit. Has some occasional LE edema but overall improving. No recent chest pain.  - compliant with torsemide.   2. HTN - compliant with medications  3. Hyperlipidemia - compliant with statin  4. LE edema - on torsemide, overall improving.   5. Cough - she attributes to lisionpril, ongoing for several months Past Medical History:  Diagnosis Date  . Anxiety   . Coronary artery disease 09/2011   a. Anterior MI. PCI and BMS to LAD and angioplsty to ostial Ramus;  b. 02/2012 Cath patent stent and ptca site, med rx.  . Diabetes mellitus   . Hyperlipidemia   . Hypertension   . Migraines   . Morbid obesity (HCC)      Allergies  Allergen Reactions  . Bee Venom Swelling  . Sulfa Drugs Cross Reactors Nausea And Vomiting     Current Outpatient Prescriptions  Medication Sig Dispense Refill  . aspirin 81 MG tablet Take 81 mg by mouth daily.    Marland Kitchen atorvastatin (LIPITOR) 80 MG tablet Take 80 mg by mouth daily.    . carvedilol (COREG) 25 MG tablet TAKE 1 TABLET BY MOUTH TWICE DAILY WITH A MEAL 60 tablet 0  . clopidogrel (PLAVIX) 75 MG tablet Take 1  tablet (75 mg total) by mouth daily. 30 tablet 6  . hydrOXYzine (VISTARIL) 25 MG capsule Take 1 capsule by mouth daily as needed.  0  . losartan (COZAAR) 25 MG tablet Take 0.5 tablets (12.5 mg total) by mouth daily. 45 tablet 3  . medroxyPROGESTERone (PROVERA) 5 MG tablet Take 5 mg by mouth daily.    . metFORMIN (GLUCOPHAGE) 1000 MG tablet Take 1 tablet by mouth 2 (two) times daily.  5  . nitroGLYCERIN (NITROSTAT) 0.4 MG SL tablet Place 0.4 mg under the tongue every 5 (five) minutes as needed for chest pain.    . potassium chloride SA (K-DUR,KLOR-CON) 20 MEQ tablet Take 1 tablet by mouth daily.    . pregabalin (LYRICA) 75 MG capsule Take 75 mg by mouth 2 (two) times daily.    Marland Kitchen torsemide (DEMADEX) 20 MG tablet TAKE 2 TABLETS BY MOUTH DAILY 60 tablet 0   No current facility-administered medications for this visit.      Past Surgical History:  Procedure Laterality Date  . CARDIAC CATHETERIZATION  10/08/2011   LAD: 95% mid, Ramus: 95% ostial  . CESAREAN SECTION    . CHOLECYSTECTOMY    . CORONARY ANGIOPLASTY WITH STENT PLACEMENT  10/08/2011   LAD: BMS, Ramus: cutting balloon angioplasty  . LEFT HEART CATHETERIZATION WITH CORONARY ANGIOGRAM N/A 10/08/2011   Procedure:  LEFT HEART CATHETERIZATION WITH CORONARY ANGIOGRAM;  Surgeon: Peter M Swaziland, MD;  Location: [facility] CATH LAB;  Service: Cardiovascular;  Laterality: N/A;  . LEFT HEART CATHETERIZATION WITH CORONARY ANGIOGRAM N/A 03/02/2012   Procedure: LEFT HEART CATHETERIZATION WITH CORONARY ANGIOGRAM;  Surgeon: Kathleene Hazel, MD;  Location: [facility] CATH LAB;  Service: Cardiovascular;  Laterality: N/A;  . LEFT HEART CATHETERIZATION WITH CORONARY ANGIOGRAM N/A 01/04/2014   Procedure: LEFT HEART CATHETERIZATION WITH CORONARY ANGIOGRAM;  Surgeon: Lennette Bihari, MD;  Location[facility] CATH LAB;  Service: Cardiovascular;  Laterality: N/A;     Allergies  Allergen Reactions  . Bee Venom Swelling  . Sulfa Drugs Cross Reactors Nausea And Vomiting       Family History  Problem Relation Age of Onset  . Heart attack Father      Social History Ms. Strzelecki reports that she has been smoking Cigarettes.  She started smoking about 26 years ago. She has a 10.00 pack-year smoking history. She has never used smokeless tobacco. Ms. Secord reports that she does not drink alcohol.   Review of Systems CONSTITUTIONAL: No weight loss, fever, chills, weakness or fatigue.  HEENT: Eyes: No visual loss, blurred vision, double vision or yellow sclerae.No hearing loss, sneezing, congestion, runny nose or sore throat.  SKIN: No rash or itching.  CARDIOVASCULAR:  RESPIRATORY: No shortness of breath, cough or sputum.  GASTROINTESTINAL: No anorexia, nausea, vomiting or diarrhea. No abdominal pain or blood.  GENITOURINARY: No burning on urination, no polyuria NEUROLOGICAL: No headache, dizziness, syncope, paralysis, ataxia, numbness or tingling in the extremities. No change in bowel or bladder control.  MUSCULOSKELETAL: No muscle, back pain, joint pain or stiffness.  LYMPHATICS: No enlarged nodes. No history of splenectomy.  PSYCHIATRIC: No history of depression or anxiety.  ENDOCRINOLOGIC: No reports of sweating, cold or heat intolerance. No polyuria or polydipsia.  Marland Kitchen   Physical Examination There were no vitals filed for this visit. There were no vitals filed for this visit.  Gen: resting comfortably, no acute distress HEENT: no scleral icterus, pupils equal round and reactive, no palptable cervical adenopathy,  CV Resp: Clear to auscultation bilaterally GI: abdomen is soft, non-tender, non-distended, normal bowel sounds, no hepatosplenomegaly MSK: extremities are warm, no edema.  Skin: warm, no rash Neuro:  no focal deficits Psych: appropriate affect   Diagnostic Studies  02/2012 Cath Hemodynamic Findings: Central aortic pressure: 108/70  Left ventricular pressure: 123/10/21  Angiographic Findings: Left main: No obstructive  disease noted.  Left Anterior Descending Artery: Large caliber vessel that courses to the apex. There is a stent present in the mid vessel that is widely patent with no restenosis. The remainder of the LAD is disease free. There is a moderate sized diagonal Madelyn Tlatelpa with 30% proximal stenosis.  Circumflex Artery: Dominant, large caliber vessel with no disease throughout the AV groove segment. There is small to moderate sized intermediate Bracken Moffa that has ostial 30% stenosis. The remainder of the Circumflex has no obstructive disease.  Right Coronary Artery: Small, non-dominant vessel. No disease noted.  Left Ventricular Angiogram: LVEF 50-55%.  Impression:  1. Double vessel CAD with patent stent mid LAD and patent angioplasty site at ostium of intermediate Emerly Prak.  2. Normal LV systolic function   09/2011 Cath PROCEDURAL FINDINGS  Hemodynamics:  AO 135/92 with a mean of 114 mmHg  LV 136/25 mmHg  Coronary angiography:  Coronary dominance: Left  Left mainstem: Normal.  Left anterior descending (LAD): There is a 95% stenosis in the mid LAD immediately after  the takeoff of the first diagonal. The first diagonal Kaliel Bolds is moderate in size and appears normal. The LAD stenosis does appear to be hazy.  There is a moderately large ramus intermediate Abishai Viegas which has a 95% ostial stenosis.  Left circumflex (LCx): This is a dominant vessel and appears normal throughout.  Right coronary artery (RCA): This is a small nondominant vessel and is normal.  Left ventriculography: Left ventricular systolic function is abnormal with apical hypokinesis. LVEF is estimated at 55%, there is no significant mitral regurgitation  PCI Note: Following the diagnostic procedure, the decision was made to proceed with PCI of the LAD. Weight-based bivalirudin was given for anticoagulation. Once a therapeutic ACT was achieved, a 5 [place] EBU guide catheter was inserted. A pro-water coronary guidewire was used to  cross the lesion. The lesion was predilated with a 2.5 mm balloon. The lesion was then stented with a 3.0 x 18 mm vision stent. The stent was postdilated with a 3.25 mm noncompliant balloon. Following PCI, there was 0% residual stenosis and TIMI-3 flow. At this point the patient's pain was improved but was Cudd of moderate intensity. We then proceeded to intervene on the ostial ramus intermediate lesion. This was crossed with the prowater wire. This was dilated using a 3.0 x 10 mm cutting balloon performing 3 inflations to 6 atmospheres. This showed a good angiographic result with less than 30% residual stenosis and TIMI grade 3 flow. Final angiography confirmed an excellent result. The patient tolerated the procedure well. There were no immediate procedural complications. A TR band was used for radial hemostasis. The patient was transferred to the post catheterization recovery area for further monitoring.  PCI Data:  Vessel - LAD/Segment - mid vessel immediately after the takeoff of the first diagonal.  Percent Stenosis (pre) 95%  TIMI-flow 3.  Stent 3.0 x 18 mm vision stent.  Percent Stenosis (post) 0%  TIMI-flow (post) 3.  Second vessel: Ostial ramus intermediate Rachele Lamaster  Percent stenosis: 95%.  TIMI flow 3.  3.0 mm cutting balloon  Percent stenosis (post) less than 30%  TIMI flow 3  Final Conclusions:  1. Severe 2 vessel obstructive coronary disease.  2. Overall well preserved LV systolic function with apical wall motion abnormality and ejection fraction of 55%.  3. Successful intracoronary stenting of the mid LAD with a bare-metal stent.  4. Successful cutting balloon angioplasty of the ramus intermediate Ryliee Figge.  Recommendations:  Continue therapy with Plavix. Patient reports a history of allergy to aspirin. Aggressive risk factor modification.  Disposition: Patient be observed in the intensive care unit tonight. If she has no complications she should be able to be  transferred to telemetry in the morning.   12/2013 Cath HEMODYNAMICS:  Central Aorta: 100/60  Left Ventricle: 100/3  ANGIOGRAPHY:  1. Left main: Normal and trifurcated into an LAD, ramus intermediate vessel, and a dominant left circumflex coronary artery  2. LAD: Barbera Setters gave rise to a proximal large first diagonal vessel. The LAD just after the diagonal vessel had a widely patent stent. The remainder of the LAD was free of significant disease and extended to the LV apex. 3. Ramus Intermediate: No evidence for restenosis at previous site of ostial cutting balloon intervention. 4. Left circumflex: Angiographically normal. Dominant vessel, which gave rise to 2 marginal branches and in the PDA.Marland Kitchen  4. Right coronary artery: Angiographically normal, nondominant vessel  Left ventriculography revealed global LV function. There was a suggestion of mild residual distal anterolateral hypocontractility. There was catheter-induced mitral  regurgitation.  IMPRESSION:  Normal LV function with mild distal anterolateral, residual hypocontractility  No significant residual coronary obstructive disease with evidence for widely patent LAD stent, no evidence for restenosis of the ramus intermediate vessel, normal dominant left circumflex coronary artery and normal nondominant right coronary artery.    12/2013 [facility] IMPRESSION: 1. Abnormal Lexiscan for ischemia  2. Large anteroseptal and apical area of ischemia  3. High risk study for major cardiac events based on area of myocardium at [facility]  4. Normal left ventricular systolic function, left ventricular ejection fraction 52%     Assessment and Plan   1. CAD - recent NSTEMI with admission to [facility], cath without obstructive CAD. Elevated LVEDP perhaps played some role - continue DAPT for 1 year for medical management of NSTEMI. - no recent symptoms, continue current meds   2. HTN - she is at goal, we will  continue current therapy.   3. Hyperlipidemia - continue statin  4. LE edema - improving with torsemide, we will continue. Check BMET/Mg  5. Cough - possibly ACE-I cough, stop lisionpril and start losartan 12.5mg  daily.    F/u 3 months     Antoine Poche, M.D.

## 2016-11-05 ENCOUNTER — Other Ambulatory Visit: Payer: Self-pay | Admitting: Cardiology

## 2016-11-10 ENCOUNTER — Other Ambulatory Visit: Payer: Self-pay | Admitting: Cardiology

## 2016-11-17 ENCOUNTER — Encounter: Payer: Self-pay | Admitting: Cardiology

## 2016-11-17 ENCOUNTER — Ambulatory Visit (INDEPENDENT_AMBULATORY_CARE_PROVIDER_SITE_OTHER): Payer: Medicaid - Out of State | Admitting: Cardiology

## 2016-11-17 VITALS — BP 136/86 | HR 91 | Ht 71.0 in | Wt 383.0 lb

## 2016-11-17 DIAGNOSIS — E782 Mixed hyperlipidemia: Secondary | ICD-10-CM

## 2016-11-17 DIAGNOSIS — I2510 Atherosclerotic heart disease of native coronary artery without angina pectoris: Secondary | ICD-10-CM

## 2016-11-17 DIAGNOSIS — I10 Essential (primary) hypertension: Secondary | ICD-10-CM

## 2016-11-17 DIAGNOSIS — I5032 Chronic diastolic (congestive) heart failure: Secondary | ICD-10-CM | POA: Diagnosis not present

## 2016-11-17 DIAGNOSIS — Z79899 Other long term (current) drug therapy: Secondary | ICD-10-CM

## 2016-11-17 NOTE — Patient Instructions (Signed)
Medication Instructions:  Your physician recommends that you continue on your current medications as directed. Please refer to the Current Medication list given to you today.   Labwork: Your physician recommends that you return for lab work in: ASAP   Testing/Procedures: NONE  Follow-Up: Your physician recommends that you schedule a follow-up appointment in: 4 MONTHS    Any Other Special Instructions Will Be Listed Below (If Applicable). I HAVE GIVEN YOU A LIST OF PCP'S     If you need a refill on your cardiac medications before your next appointment, please call your pharmacy.

## 2016-11-17 NOTE — Progress Notes (Signed)
Clinical Summary [name] is a 36 y.o.female seen today for follow up of the following medical problems  1. CAD - prior anterior MI 09/2011, 95% lesion in mid LAD and ramus with 95% ostial stenosis. LVEF 55% by LV gram, apical hypokinesis. BMS to mid LAD and cutting balloon angioplasty of ramus.  - repeat cath 02/2012 with patent vessels and stent - 09/2011 Echo: LVEF 50% - 12/2013 Lexiscan large anteroseptal ischemia - cath 12/2013 with patent vessels - cath 02/2016 [facility] in setting of NSTEMIshowed LM patent, LAD patent with patent stent, distal LAD 30-40%, LCX patent, RCA 30-40%. LVEDP 40, PCWP 30 mean PA 32, CI 4.93 - echo 02/2016 [place] LVEF 50%, apical akinesis -started on torsemide 40mg  daily. Started on plavix 75mg  daily at discharge.   - no recent chest pain. Dubs with some SOB at times, mainly with long distances.  2. Chronic diastolic HF  occasional LE edema. Compliant with torsemide 40mg  daily. Working to limit sodium intake. Weights have been trending up.   3. HTN - she is compliant with medications  4. Hyperlipidemia - compliant with statin - she reports upcoming labs with pcp    Past Medical History:  Diagnosis Date  . Anxiety   . Coronary artery disease 09/2011   a. Anterior MI. PCI and BMS to LAD and angioplsty to ostial Ramus;  b. 02/2012 Cath patent stent and ptca site, med rx.  . Diabetes mellitus   . Hyperlipidemia   . Hypertension   . Migraines   . Morbid obesity (HCC)      Allergies  Allergen Reactions  . Bee Venom Swelling  . Sulfa Drugs Cross Reactors Nausea And Vomiting     Current Outpatient Prescriptions  Medication Sig Dispense Refill  . aspirin 81 MG tablet Take 81 mg by mouth daily.    Marland Kitchen atorvastatin (LIPITOR) 80 MG tablet Take 80 mg by mouth daily.    . carvedilol (COREG) 25 MG tablet TAKE 1 TABLET BY MOUTH TWICE DAILY WITH A MEAL 60 tablet 6  . clopidogrel (PLAVIX) 75 MG tablet TAKE 1 TABLET BY MOUTH DAILY 30 tablet 0  .  hydrOXYzine (VISTARIL) 25 MG capsule Take 1 capsule by mouth daily as needed.  0  . losartan (COZAAR) 25 MG tablet Take 0.5 tablets (12.5 mg total) by mouth daily. 45 tablet 3  . medroxyPROGESTERone (PROVERA) 5 MG tablet Take 5 mg by mouth daily.    . metFORMIN (GLUCOPHAGE) 1000 MG tablet Take 1 tablet by mouth 2 (two) times daily.  5  . nitroGLYCERIN (NITROSTAT) 0.4 MG SL tablet Place 0.4 mg under the tongue every 5 (five) minutes as needed for chest pain.    . potassium chloride SA (K-DUR,KLOR-CON) 20 MEQ tablet Take 1 tablet by mouth daily.    . pregabalin (LYRICA) 75 MG capsule Take 75 mg by mouth 2 (two) times daily.    Marland Kitchen torsemide (DEMADEX) 20 MG tablet TAKE 2 TABLETS BY MOUTH DAILY 60 tablet 6   No current facility-administered medications for this visit.      Past Surgical History:  Procedure Laterality Date  . CARDIAC CATHETERIZATION  10/08/2011   LAD: 95% mid, Ramus: 95% ostial  . CESAREAN SECTION    . CHOLECYSTECTOMY    . CORONARY ANGIOPLASTY WITH STENT PLACEMENT  10/08/2011   LAD: BMS, Ramus: cutting balloon angioplasty  . LEFT HEART CATHETERIZATION WITH CORONARY ANGIOGRAM N/A 10/08/2011   Procedure: LEFT HEART CATHETERIZATION WITH CORONARY ANGIOGRAM;  Surgeon: Peter M Swaziland,  MD;  Location: [facility] CATH LAB;  Service: Cardiovascular;  Laterality: N/A;  . LEFT HEART CATHETERIZATION WITH CORONARY ANGIOGRAM N/A 03/02/2012   Procedure: LEFT HEART CATHETERIZATION WITH CORONARY ANGIOGRAM;  Surgeon: Kathleene Hazel, MD;  Location: [facility] CATH LAB;  Service: Cardiovascular;  Laterality: N/A;  . LEFT HEART CATHETERIZATION WITH CORONARY ANGIOGRAM N/A 01/04/2014   Procedure: LEFT HEART CATHETERIZATION WITH CORONARY ANGIOGRAM;  Surgeon: Lennette Bihari, MD;  Location: [facility] CATH LAB;  Service: Cardiovascular;  Laterality: N/A;     Allergies  Allergen Reactions  . Bee Venom Swelling  . Sulfa Drugs Cross Reactors Nausea And Vomiting      Family History  Problem Relation Age of Onset  .  Heart attack Father      Social History [name] reports that she has been smoking Cigarettes.  She started smoking about 26 years ago. She has a 10.00 pack-year smoking history. She has never used smokeless tobacco. [name] reports that she does not drink alcohol.   Review of Systems CONSTITUTIONAL: No weight loss, fever, chills, weakness or fatigue.  HEENT: Eyes: No visual loss, blurred vision, double vision or yellow sclerae.No hearing loss, sneezing, congestion, runny nose or sore throat.  SKIN: No rash or itching.  CARDIOVASCULAR: per HPI RESPIRATORY: No shortness of breath, cough or sputum.  GASTROINTESTINAL: No anorexia, nausea, vomiting or diarrhea. No abdominal pain or blood.  GENITOURINARY: No burning on urination, no polyuria NEUROLOGICAL: No headache, dizziness, syncope, paralysis, ataxia, numbness or tingling in the extremities. No change in bowel or bladder control.  MUSCULOSKELETAL: No muscle, back pain, joint pain or stiffness.  LYMPHATICS: No enlarged nodes. No history of splenectomy.  PSYCHIATRIC: No history of depression or anxiety.  ENDOCRINOLOGIC: No reports of sweating, cold or heat intolerance. No polyuria or polydipsia.  Marland Kitchen   Physical Examination Vitals:   11/17/16 1317  BP: 136/86  Pulse: 91   Vitals:   11/17/16 1317  Weight: (!) 383 lb (173.7 kg)  Height: 5\' 11"  (1.803 m)    Gen: resting comfortably, no acute distress HEENT: no scleral icterus, pupils equal round and reactive, no palptable cervical adenopathy,  CV: RRR, no m/r/g, no jvd Resp: Clear to auscultation bilaterally GI: abdomen is soft, non-tender, non-distended, normal bowel sounds, no hepatosplenomegaly MSK: extremities are warm, no edema.  Skin: warm, no rash Neuro:  no focal deficits Psych: appropriate affect   Diagnostic Studies 02/2012 Cath Hemodynamic Findings: Central aortic pressure: 108/70  Left ventricular pressure: 123/10/21  Angiographic Findings: Left main:  No obstructive disease noted.  Left Anterior Descending Artery: Large caliber vessel that courses to the apex. There is a stent present in the mid vessel that is widely patent with no restenosis. The remainder of the LAD is disease free. There is a moderate sized diagonal branch with 30% proximal stenosis.  Circumflex Artery: Dominant, large caliber vessel with no disease throughout the AV groove segment. There is small to moderate sized intermediate branch that has ostial 30% stenosis. The remainder of the Circumflex has no obstructive disease.  Right Coronary Artery: Small, non-dominant vessel. No disease noted.  Left Ventricular Angiogram: LVEF 50-55%.  Impression:  1. Double vessel CAD with patent stent mid LAD and patent angioplasty site at ostium of intermediate branch.  2. Normal LV systolic function   09/2011 Cath PROCEDURAL FINDINGS  Hemodynamics:  AO 135/92 with a mean of 114 mmHg  LV 136/25 mmHg  Coronary angiography:  Coronary dominance: Left  Left mainstem: Normal.  Left anterior descending (LAD):  There is a 95% stenosis in the mid LAD immediately after the takeoff of the first diagonal. The first diagonal branch is moderate in size and appears normal. The LAD stenosis does appear to be hazy.  There is a moderately large ramus intermediate branch which has a 95% ostial stenosis.  Left circumflex (LCx): This is a dominant vessel and appears normal throughout.  Right coronary artery (RCA): This is a small nondominant vessel and is normal.  Left ventriculography: Left ventricular systolic function is abnormal with apical hypokinesis. LVEF is estimated at 55%, there is no significant mitral regurgitation  PCI Note: Following the diagnostic procedure, the decision was made to proceed with PCI of the LAD. Weight-based bivalirudin was given for anticoagulation. Once a therapeutic ACT was achieved, a 5 [place] EBU guide catheter was inserted. A pro-water coronary  guidewire was used to cross the lesion. The lesion was predilated with a 2.5 mm balloon. The lesion was then stented with a 3.0 x 18 mm vision stent. The stent was postdilated with a 3.25 mm noncompliant balloon. Following PCI, there was 0% residual stenosis and TIMI-3 flow. At this point the patient's pain was improved but was Brownstein of moderate intensity. We then proceeded to intervene on the ostial ramus intermediate lesion. This was crossed with the prowater wire. This was dilated using a 3.0 x 10 mm cutting balloon performing 3 inflations to 6 atmospheres. This showed a good angiographic result with less than 30% residual stenosis and TIMI grade 3 flow. Final angiography confirmed an excellent result. The patient tolerated the procedure well. There were no immediate procedural complications. A TR band was used for radial hemostasis. The patient was transferred to the post catheterization recovery area for further monitoring.  PCI Data:  Vessel - LAD/Segment - mid vessel immediately after the takeoff of the first diagonal.  Percent Stenosis (pre) 95%  TIMI-flow 3.  Stent 3.0 x 18 mm vision stent.  Percent Stenosis (post) 0%  TIMI-flow (post) 3.  Second vessel: Ostial ramus intermediate branch  Percent stenosis: 95%.  TIMI flow 3.  3.0 mm cutting balloon  Percent stenosis (post) less than 30%  TIMI flow 3  Final Conclusions:  1. Severe 2 vessel obstructive coronary disease.  2. Overall well preserved LV systolic function with apical wall motion abnormality and ejection fraction of 55%.  3. Successful intracoronary stenting of the mid LAD with a bare-metal stent.  4. Successful cutting balloon angioplasty of the ramus intermediate branch.  Recommendations:  Continue therapy with Plavix. Patient reports a history of allergy to aspirin. Aggressive risk factor modification.  Disposition: Patient be observed in the intensive care unit tonight. If she has no complications she  should be able to be transferred to telemetry in the morning.   12/2013 Cath HEMODYNAMICS:  Central Aorta: 100/60  Left Ventricle: 100/3  ANGIOGRAPHY:  1. Left main: Normal and trifurcated into an LAD, ramus intermediate vessel, and a dominant left circumflex coronary artery  2. LAD: Barbera Setters gave rise to a proximal large first diagonal vessel. The LAD just after the diagonal vessel had a widely patent stent. The remainder of the LAD was free of significant disease and extended to the LV apex. 3. Ramus Intermediate: No evidence for restenosis at previous site of ostial cutting balloon intervention. 4. Left circumflex: Angiographically normal. Dominant vessel, which gave rise to 2 marginal branches and in the PDA.Marland Kitchen  4. Right coronary artery: Angiographically normal, nondominant vessel  Left ventriculography revealed global LV function. There was a  suggestion of mild residual distal anterolateral hypocontractility. There was catheter-induced mitral regurgitation.  IMPRESSION:  Normal LV function with mild distal anterolateral, residual hypocontractility  No significant residual coronary obstructive disease with evidence for widely patent LAD stent, no evidence for restenosis of the ramus intermediate vessel, normal dominant left circumflex coronary artery and normal nondominant right coronary artery.    12/2013 MPI IMPRESSION: 1. Abnormal Lexiscan for ischemia  2. Large anteroseptal and apical area of ischemia  3. High risk study for major cardiac events based on area of myocardium at [facility]  4. Normal left ventricular systolic function, left ventricular ejection fraction 52%    Assessment and Plan  1. CAD - recent NSTEMI with admission to [facility], cath without obstructive CAD. Elevated LVEDP perhaps played some role - continue DAPT for 1 year for medical management of NSTEMI. - continue current meds  2. Chronic diastolic HF - uptrending weight.  Counseled on sodium restrictions, ok to take additional diuretic as needed for edema or significant weight gain.    3. HTN - at goal, continue current meds  4. Hyperlipidemia - repeat lipid panel. Continue statin.      F/u 4 months       Antoine Poche, M.D.

## 2017-02-15 ENCOUNTER — Encounter (HOSPITAL_COMMUNITY): Payer: Self-pay | Admitting: Emergency Medicine

## 2017-02-15 ENCOUNTER — Observation Stay (HOSPITAL_COMMUNITY)
Admission: EM | Admit: 2017-02-15 | Discharge: 2017-02-17 | Disposition: A | Discharge status: Home / Self Care | Payer: Medicaid - Out of State | Attending: Internal Medicine | Admitting: Internal Medicine

## 2017-02-15 ENCOUNTER — Emergency Department (HOSPITAL_COMMUNITY): Payer: Medicaid - Out of State

## 2017-02-15 DIAGNOSIS — Z6841 Body Mass Index (BMI) 40.0 and over, adult: Secondary | ICD-10-CM | POA: Insufficient documentation

## 2017-02-15 DIAGNOSIS — I252 Old myocardial infarction: Secondary | ICD-10-CM | POA: Insufficient documentation

## 2017-02-15 DIAGNOSIS — Z9049 Acquired absence of other specified parts of digestive tract: Secondary | ICD-10-CM | POA: Diagnosis not present

## 2017-02-15 DIAGNOSIS — E1122 Type 2 diabetes mellitus with diabetic chronic kidney disease: Secondary | ICD-10-CM

## 2017-02-15 DIAGNOSIS — N132 Hydronephrosis with renal and ureteral calculous obstruction: Secondary | ICD-10-CM

## 2017-02-15 DIAGNOSIS — N200 Calculus of kidney: Secondary | ICD-10-CM

## 2017-02-15 DIAGNOSIS — D72829 Elevated white blood cell count, unspecified: Secondary | ICD-10-CM | POA: Diagnosis not present

## 2017-02-15 DIAGNOSIS — B9689 Other specified bacterial agents as the cause of diseases classified elsewhere: Secondary | ICD-10-CM | POA: Diagnosis not present

## 2017-02-15 DIAGNOSIS — N136 Pyonephrosis: Secondary | ICD-10-CM | POA: Diagnosis not present

## 2017-02-15 DIAGNOSIS — Z955 Presence of coronary angioplasty implant and graft: Secondary | ICD-10-CM | POA: Insufficient documentation

## 2017-02-15 DIAGNOSIS — I2510 Atherosclerotic heart disease of native coronary artery without angina pectoris: Secondary | ICD-10-CM | POA: Diagnosis not present

## 2017-02-15 DIAGNOSIS — Z7902 Long term (current) use of antithrombotics/antiplatelets: Secondary | ICD-10-CM | POA: Insufficient documentation

## 2017-02-15 DIAGNOSIS — E119 Type 2 diabetes mellitus without complications: Secondary | ICD-10-CM | POA: Diagnosis not present

## 2017-02-15 DIAGNOSIS — D72828 Other elevated white blood cell count: Secondary | ICD-10-CM

## 2017-02-15 DIAGNOSIS — E785 Hyperlipidemia, unspecified: Secondary | ICD-10-CM | POA: Insufficient documentation

## 2017-02-15 DIAGNOSIS — N182 Chronic kidney disease, stage 2 (mild): Secondary | ICD-10-CM

## 2017-02-15 DIAGNOSIS — Z7982 Long term (current) use of aspirin: Secondary | ICD-10-CM | POA: Diagnosis not present

## 2017-02-15 DIAGNOSIS — Z7985 Type 2 diabetes mellitus without complications: Secondary | ICD-10-CM

## 2017-02-15 DIAGNOSIS — N138 Other obstructive and reflux uropathy: Secondary | ICD-10-CM

## 2017-02-15 DIAGNOSIS — E876 Hypokalemia: Secondary | ICD-10-CM | POA: Insufficient documentation

## 2017-02-15 DIAGNOSIS — Z79899 Other long term (current) drug therapy: Secondary | ICD-10-CM | POA: Diagnosis not present

## 2017-02-15 DIAGNOSIS — Z7984 Long term (current) use of oral hypoglycemic drugs: Secondary | ICD-10-CM | POA: Insufficient documentation

## 2017-02-15 DIAGNOSIS — E871 Hypo-osmolality and hyponatremia: Secondary | ICD-10-CM | POA: Insufficient documentation

## 2017-02-15 DIAGNOSIS — N10 Acute tubulo-interstitial nephritis: Secondary | ICD-10-CM

## 2017-02-15 DIAGNOSIS — I10 Essential (primary) hypertension: Secondary | ICD-10-CM | POA: Diagnosis not present

## 2017-02-15 DIAGNOSIS — F17210 Nicotine dependence, cigarettes, uncomplicated: Secondary | ICD-10-CM | POA: Insufficient documentation

## 2017-02-15 HISTORY — DX: Other obstructive and reflux uropathy: N13.8

## 2017-02-15 HISTORY — DX: Other obstructive and reflux uropathy: N20.0

## 2017-02-15 LAB — URINALYSIS, ROUTINE W REFLEX MICROSCOPIC
Bilirubin Urine: NEGATIVE
Glucose, UA: NEGATIVE mg/dL
Ketones, ur: NEGATIVE mg/dL
Nitrite: POSITIVE — AB
Protein, ur: 30 mg/dL — AB
Specific Gravity, Urine: 1.01 (ref 1.005–1.030)
pH: 6 (ref 5.0–8.0)

## 2017-02-15 LAB — COMPREHENSIVE METABOLIC PANEL
ALT: 20 U/L (ref 14–54)
AST: 16 U/L (ref 15–41)
Albumin: 3.7 g/dL (ref 3.5–5.0)
Alkaline Phosphatase: 80 U/L (ref 38–126)
Anion gap: 12 (ref 5–15)
BUN: 10 mg/dL (ref 6–20)
CO2: 19 mmol/L — ABNORMAL LOW (ref 22–32)
Calcium: 8.8 mg/dL — ABNORMAL LOW (ref 8.9–10.3)
Chloride: 103 mmol/L (ref 101–111)
Creatinine, Ser: 0.78 mg/dL (ref 0.44–1.00)
GFR calc Af Amer: >60 mL/min (ref >60)
GFR calc non Af Amer: >60 mL/min (ref >60)
Glucose, Bld: 144 mg/dL — ABNORMAL HIGH (ref 65–99)
Potassium: 3.3 mmol/L — ABNORMAL LOW (ref 3.5–5.1)
Sodium: 134 mmol/L — ABNORMAL LOW (ref 135–145)
Total Bilirubin: 1.1 mg/dL (ref 0.3–1.2)
Total Protein: 7.6 g/dL (ref 6.5–8.1)

## 2017-02-15 LAB — CBC
HCT: 40.6 % (ref 36.0–46.0)
Hemoglobin: 14 g/dL (ref 12.0–15.0)
MCH: 30.2 pg (ref 26.0–34.0)
MCHC: 34.5 g/dL (ref 30.0–36.0)
MCV: 87.5 fL (ref 78.0–100.0)
Platelets: 226 10*3/uL (ref 150–400)
RBC: 4.64 MIL/uL (ref 3.87–5.11)
RDW: 13.4 % (ref 11.5–15.5)
WBC: 18.7 10*3/uL — ABNORMAL HIGH (ref 4.0–10.5)

## 2017-02-15 LAB — LIPASE, BLOOD: Lipase: 17 U/L (ref 11–51)

## 2017-02-15 LAB — LACTIC ACID, PLASMA: Lactic Acid, Venous: 1 mmol/L (ref 0.5–1.9)

## 2017-02-15 LAB — PREGNANCY, URINE: Preg Test, Ur: NEGATIVE

## 2017-02-15 MED ORDER — ONDANSETRON HCL 4 MG/2ML IJ SOLN
4.0000 mg | Freq: Once | INTRAMUSCULAR | Status: AC
  Administered 2017-02-15: 4 mg via INTRAVENOUS
  Filled 2017-02-15: qty 2

## 2017-02-15 MED ORDER — KETOROLAC TROMETHAMINE 30 MG/ML IJ SOLN
30.0000 mg | Freq: Once | INTRAMUSCULAR | Status: AC
  Administered 2017-02-15: 30 mg via INTRAVENOUS
  Filled 2017-02-15: qty 1

## 2017-02-15 MED ORDER — SODIUM CHLORIDE 0.9 % IV BOLUS (SEPSIS)
1000.0000 mL | Freq: Once | INTRAVENOUS | Status: AC
  Administered 2017-02-15: 1000 mL via INTRAVENOUS

## 2017-02-15 MED ORDER — DEXTROSE 5 % IV SOLN
1.0000 g | Freq: Once | INTRAVENOUS | Status: AC
  Administered 2017-02-15: 1 g via INTRAVENOUS
  Filled 2017-02-15: qty 10

## 2017-02-15 MED ORDER — IOPAMIDOL (ISOVUE-300) INJECTION 61%
100.0000 mL | Freq: Once | INTRAVENOUS | Status: AC | PRN
  Administered 2017-02-15: 100 mL via INTRAVENOUS

## 2017-02-15 NOTE — ED Provider Notes (Signed)
[facility]-EMERGENCY DEPT Provider Note   CSN: [id number] Arrival date & time: 02/15/17  1908     History   Chief Complaint Chief Complaint  Patient presents with  . Abdominal Pain    HPI Filippa Yarbough Lundeen is a 36 y.o. female with past medical history as outlined below, significant for DM, HTN and CAD presenting with a 24 hour history of nausea, vomiting, diarrhea (for 12 hours yesterday, now resolved without treatment) and mid abdominal pain radiating into her left flank.  She also reports cloudy appearing urine but denies hematuria, increased urinary frequency or painful urination.  She has been unable to tolerate PO intake since yesterday. She denies chest pain, sob fevers, chills.  She has found no alleviators.  She believes she is dehydrated and reports increased thirst. She has had no medications for her symptoms prior to arrival and also has not been able to keep down any of her chronic medications today.  The history is provided by the patient.    Past Medical History:  Diagnosis Date  . Anxiety   . Coronary artery disease 09/2011   a. Anterior MI. PCI and BMS to LAD and angioplsty to ostial Ramus;  b. 02/2012 Cath patent stent and ptca site, med rx.  . Diabetes mellitus   . Hyperlipidemia   . Hypertension   . Migraines   . Morbid obesity [facility])     Patient Active Problem List   Diagnosis Date Noted  . Urinary tract obstruction due to kidney stone 02/15/2017  . Midsternal chest pain 03/03/2012  . Palpitations 02/17/2012  . Coronary artery disease   . DM2 (diabetes mellitus, type 2) (HCC) 10/10/2011  . Acute anterior wall MI (HCC) 10/09/2011  . Morbid obesity (HCC) 10/09/2011  . Tobacco abuse 10/09/2011  . Hyperlipidemia 10/09/2011    Past Surgical History:  Procedure Laterality Date  . CARDIAC CATHETERIZATION  10/08/2011   LAD: 95% mid, Ramus: 95% ostial  . CESAREAN SECTION    . CHOLECYSTECTOMY    . CORONARY ANGIOPLASTY WITH STENT PLACEMENT  10/08/2011   LAD: BMS,  Ramus: cutting balloon angioplasty  . LEFT HEART CATHETERIZATION WITH CORONARY ANGIOGRAM N/A 10/08/2011   Procedure: LEFT HEART CATHETERIZATION WITH CORONARY ANGIOGRAM;  Surgeon: Peter M Swaziland, MD;  Location: [facility] CATH LAB;  Service: Cardiovascular;  Laterality: N/A;  . LEFT HEART CATHETERIZATION WITH CORONARY ANGIOGRAM N/A 03/02/2012   Procedure: LEFT HEART CATHETERIZATION WITH CORONARY ANGIOGRAM;  Surgeon: Kathleene Hazel, MD;  Location: [facility] CATH LAB;  Service: Cardiovascular;  Laterality: N/A;  . LEFT HEART CATHETERIZATION WITH CORONARY ANGIOGRAM N/A 01/04/2014   Procedure: LEFT HEART CATHETERIZATION WITH CORONARY ANGIOGRAM;  Surgeon: Lennette Bihari, MD;  Location: [facility] CATH LAB;  Service: Cardiovascular;  Laterality: N/A;    OB History    No data available       Home Medications    Prior to Admission medications   Medication Sig Start Date End Date Taking? Authorizing Provider  aspirin 81 MG tablet Take 81 mg by mouth daily.    [provider]  atorvastatin (LIPITOR) 80 MG tablet Take 80 mg by mouth daily.    [provider]  carvedilol (COREG) 25 MG tablet TAKE 1 TABLET BY MOUTH TWICE DAILY WITH A MEAL 11/05/16   Antoine Poche, MD  clopidogrel (PLAVIX) 75 MG tablet TAKE 1 TABLET BY MOUTH DAILY 11/10/16   Antoine Poche, MD  losartan (COZAAR) 25 MG tablet Take 0.5 tablets (12.5 mg total) by mouth daily. 07/21/16 11/17/16  Antoine Poche, MD  medroxyPROGESTERone (PROVERA) 5 MG tablet Take 5 mg by mouth daily.    [provider]  metFORMIN (GLUCOPHAGE) 1000 MG tablet Take 1 tablet by mouth 2 (two) times daily. 01/18/16   [provider]  nitroGLYCERIN (NITROSTAT) 0.4 MG SL tablet Place 0.4 mg under the tongue every 5 (five) minutes as needed for chest pain.    [provider]  potassium chloride SA (K-DUR,KLOR-CON) 20 MEQ tablet Take 1 tablet by mouth daily. 01/27/16   [provider]  pregabalin (LYRICA) 75 MG capsule Take 75  mg by mouth 2 (two) times daily.    [provider]  torsemide (DEMADEX) 20 MG tablet TAKE 2 TABLETS BY MOUTH DAILY 11/05/16   Antoine Poche, MD    Family History Family History  Problem Relation Age of Onset  . Heart attack Father     Social History Social History  Substance Use Topics  . Smoking status: Current Every Day Smoker    Packs/day: 0.50    Years: 20.00    Types: Cigarettes    Start date: 03/18/1990  . Smokeless tobacco: Never Used  . Alcohol use No     Allergies   Bee venom and Sulfa drugs cross reactors   Review of Systems Review of Systems  Constitutional: Negative for fever.  HENT: Negative for congestion and sore throat.   Eyes: Negative.   Respiratory: Negative for chest tightness and shortness of breath.   Cardiovascular: Negative for chest pain.  Gastrointestinal: Positive for abdominal pain, diarrhea, nausea and vomiting.  Genitourinary: Positive for flank pain. Negative for dysuria, frequency, hematuria and urgency.  Musculoskeletal: Negative for arthralgias, joint swelling and neck pain.  Skin: Negative.  Negative for rash and wound.  Neurological: Negative for dizziness, weakness, light-headedness, numbness and headaches.  Psychiatric/Behavioral: Negative.      Physical Exam Updated Vital Signs BP 105/74 (BP Location: Right Arm)   Pulse 93   Temp 98.1 F (36.7 C) (Oral)   Resp 18   Ht 5\' 10"  (1.778 m)   Wt (!) 157.4 kg (347 lb)   LMP 02/08/2017   SpO2 96%   BMI 49.79 kg/m   Physical Exam  Constitutional: She appears well-developed and well-nourished.  Morbidly obese.  HENT:  Head: Normocephalic and atraumatic.  Eyes: Conjunctivae are normal.  Neck: Normal range of motion.  Cardiovascular: Normal rate, regular rhythm, normal heart sounds and intact distal pulses.   Pulmonary/Chest: Effort normal and breath sounds normal. She has no wheezes.  Abdominal: Soft. Bowel sounds are normal. She exhibits no mass. There is no  hepatosplenomegaly. There is tenderness in the periumbilical area and left upper quadrant. There is CVA tenderness. There is no rebound and no guarding.  Positive cva ttp left.  Musculoskeletal: Normal range of motion.  Neurological: She is alert.  Skin: Skin is warm and dry.  Psychiatric: She has a normal mood and affect.  Nursing note and vitals reviewed.    ED Treatments / Results  Labs (all labs ordered are listed, but only abnormal results are displayed) Labs Reviewed  COMPREHENSIVE METABOLIC PANEL - Abnormal; Notable for the following:       Result Value   Sodium 134 (*)    Potassium 3.3 (*)    CO2 19 (*)    Glucose, Bld 144 (*)    Calcium 8.8 (*)    All other components within normal limits  CBC - Abnormal; Notable for the following:    WBC 18.7 (*)  All other components within normal limits  URINALYSIS, ROUTINE W REFLEX MICROSCOPIC - Abnormal; Notable for the following:    APPearance CLOUDY (*)    Hgb urine dipstick LARGE (*)    Protein, ur 30 (*)    Nitrite POSITIVE (*)    Leukocytes, UA LARGE (*)    Bacteria, UA MANY (*)    Squamous Epithelial / LPF 0-5 (*)    All other components within normal limits  URINE CULTURE  CULTURE, BLOOD (ROUTINE X 2)  CULTURE, BLOOD (ROUTINE X 2)  LIPASE, BLOOD  PREGNANCY, URINE  LACTIC ACID, PLASMA    EKG  EKG Interpretation None       Radiology Ct Abdomen Pelvis W Contrast  Result Date: 02/15/2017 CLINICAL DATA:  36 y/o  F; 2 days of nausea, vomiting, and diarrhea. EXAM: CT ABDOMEN AND PELVIS WITH CONTRAST TECHNIQUE: Multidetector CT imaging of the abdomen and pelvis was performed using the standard protocol following bolus administration of intravenous contrast. CONTRAST:  ISOVUE-300 IOPAMIDOL (ISOVUE-300) INJECTION 61% COMPARISON:  None. FINDINGS: Lower chest: No acute abnormality. Hepatobiliary: No focal liver abnormality is seen. Status post cholecystectomy. No biliary dilatation. Hepatic steatosis. Pancreas:  Unremarkable. No pancreatic ductal dilatation or surrounding inflammatory changes. Spleen: Normal in size without focal abnormality. Adrenals/Urinary Tract: Moderate left hydronephrosis and perinephric stranding secondary to an obstructing stone in the distal left ureter just proximal to the ureterovesicular junction measuring 4 x 4 x 8 mm (series 2, image 91 and series 5, image 93). No focal kidney lesion. No right hydronephrosis. Normal adrenal glands. Normal bladder. Stomach/Bowel: Stomach is within normal limits. Appendix appears normal. No evidence of bowel wall thickening, distention, or inflammatory changes. Vascular/Lymphatic: Aortic atherosclerosis with minimal calcification. No enlarged abdominal or pelvic lymph nodes. Reproductive: Uterus and bilateral adnexa are unremarkable. Other: No abdominal wall hernia or abnormality. No abdominopelvic ascites. Musculoskeletal: No fracture is seen. IMPRESSION: 1. Moderate left hydronephrosis secondary to obstructing stone in the distal left ureter just proximal to the ureterovesicular junction measuring up to 8 mm. 2. Hepatic steatosis. 3. Minimal calcific aortic atherosclerosis. Electronically Signed   By: Mitzi Hansen M.D.   On: 02/15/2017 22:48    Procedures Procedures (including critical care time)  Medications Ordered in ED Medications  ondansetron (ZOFRAN) injection 4 mg (4 mg Intravenous Given 02/15/17 2053)  ketorolac (TORADOL) 30 MG/ML injection 30 mg (30 mg Intravenous Given 02/15/17 2053)  sodium chloride 0.9 % bolus 1,000 mL (0 mLs Intravenous Stopped 02/15/17 2300)  cefTRIAXone (ROCEPHIN) 1 g in dextrose 5 % 50 mL IVPB (0 g Intravenous Stopped 02/15/17 2300)  iopamidol (ISOVUE-300) 61 % injection 100 mL (100 mLs Intravenous Contrast Given 02/15/17 2220)     Initial Impression / Assessment and Plan / ED Course  I have reviewed the triage vital signs and the nursing notes.  Pertinent labs & imaging results that were available  during my care of the patient were reviewed by me and considered in my medical decision making (see chart for details).     Pt with leukocytosis with uti, no SIRS criteria with stable vital signs.  CT positive for left hydronephrosis and distal ureteral stone.  Discussed with Dr. Vernie Ammons who recommends transfer to [facility] for ureteral stenting, keep npo for now.  Will consult, asking for medical admission. Discussed with Dr. Onalee Hua who will see pt and arrange for transfer to [facility].   Final Clinical Impressions(s) / ED Diagnoses   Final diagnoses:  Ureteral stone with hydronephrosis  Acute pyelonephritis  Other elevated white blood cell (WBC) count    New Prescriptions New Prescriptions   No medications on file     Victoriano Lain 02/15/17 2342    Geoffery Lyons, MD 02/15/17 2350

## 2017-02-15 NOTE — ED Triage Notes (Signed)
Pt states she has had nausea, vomiting, and diarrhea for the last 2 days

## 2017-02-16 ENCOUNTER — Encounter (HOSPITAL_COMMUNITY): Payer: Self-pay | Admitting: Certified Registered"

## 2017-02-16 ENCOUNTER — Encounter (HOSPITAL_COMMUNITY)
Admission: EM | Disposition: A | Discharge status: Home / Self Care | Payer: Self-pay | Source: Home / Self Care | Attending: Emergency Medicine

## 2017-02-16 ENCOUNTER — Observation Stay (HOSPITAL_COMMUNITY): Payer: Medicaid - Out of State | Admitting: Certified Registered"

## 2017-02-16 ENCOUNTER — Ambulatory Visit (HOSPITAL_BASED_OUTPATIENT_CLINIC_OR_DEPARTMENT_OTHER): Payer: Self-pay | Admitting: Urology

## 2017-02-16 ENCOUNTER — Observation Stay (HOSPITAL_COMMUNITY): Payer: Medicaid - Out of State

## 2017-02-16 DIAGNOSIS — B9689 Other specified bacterial agents as the cause of diseases classified elsewhere: Secondary | ICD-10-CM | POA: Diagnosis not present

## 2017-02-16 DIAGNOSIS — Z955 Presence of coronary angioplasty implant and graft: Secondary | ICD-10-CM | POA: Diagnosis not present

## 2017-02-16 DIAGNOSIS — E871 Hypo-osmolality and hyponatremia: Secondary | ICD-10-CM | POA: Diagnosis not present

## 2017-02-16 DIAGNOSIS — I252 Old myocardial infarction: Secondary | ICD-10-CM | POA: Diagnosis not present

## 2017-02-16 DIAGNOSIS — Z79899 Other long term (current) drug therapy: Secondary | ICD-10-CM | POA: Diagnosis not present

## 2017-02-16 DIAGNOSIS — Z7982 Long term (current) use of aspirin: Secondary | ICD-10-CM | POA: Diagnosis not present

## 2017-02-16 DIAGNOSIS — N200 Calculus of kidney: Secondary | ICD-10-CM | POA: Diagnosis not present

## 2017-02-16 DIAGNOSIS — Z7902 Long term (current) use of antithrombotics/antiplatelets: Secondary | ICD-10-CM | POA: Diagnosis not present

## 2017-02-16 DIAGNOSIS — Z9049 Acquired absence of other specified parts of digestive tract: Secondary | ICD-10-CM | POA: Diagnosis not present

## 2017-02-16 DIAGNOSIS — I2510 Atherosclerotic heart disease of native coronary artery without angina pectoris: Secondary | ICD-10-CM

## 2017-02-16 DIAGNOSIS — E876 Hypokalemia: Secondary | ICD-10-CM | POA: Diagnosis not present

## 2017-02-16 DIAGNOSIS — E785 Hyperlipidemia, unspecified: Secondary | ICD-10-CM | POA: Diagnosis not present

## 2017-02-16 DIAGNOSIS — E119 Type 2 diabetes mellitus without complications: Secondary | ICD-10-CM | POA: Diagnosis not present

## 2017-02-16 DIAGNOSIS — F17210 Nicotine dependence, cigarettes, uncomplicated: Secondary | ICD-10-CM | POA: Diagnosis not present

## 2017-02-16 DIAGNOSIS — N138 Other obstructive and reflux uropathy: Secondary | ICD-10-CM

## 2017-02-16 DIAGNOSIS — N136 Pyonephrosis: Secondary | ICD-10-CM | POA: Diagnosis present

## 2017-02-16 DIAGNOSIS — N132 Hydronephrosis with renal and ureteral calculous obstruction: Secondary | ICD-10-CM

## 2017-02-16 DIAGNOSIS — I10 Essential (primary) hypertension: Secondary | ICD-10-CM | POA: Diagnosis not present

## 2017-02-16 DIAGNOSIS — Z6841 Body Mass Index (BMI) 40.0 and over, adult: Secondary | ICD-10-CM | POA: Diagnosis not present

## 2017-02-16 DIAGNOSIS — D72829 Elevated white blood cell count, unspecified: Secondary | ICD-10-CM | POA: Diagnosis not present

## 2017-02-16 DIAGNOSIS — Z7984 Long term (current) use of oral hypoglycemic drugs: Secondary | ICD-10-CM | POA: Diagnosis not present

## 2017-02-16 HISTORY — PX: CYSTOSCOPY WITH RETROGRADE PYELOGRAM, URETEROSCOPY AND STENT PLACEMENT: SHX5789

## 2017-02-16 LAB — GLUCOSE, CAPILLARY
Glucose-Capillary: 146 mg/dL — ABNORMAL HIGH (ref 65–99)
Glucose-Capillary: 138 mg/dL — ABNORMAL HIGH (ref 65–99)
Glucose-Capillary: 152 mg/dL — ABNORMAL HIGH (ref 65–99)
Glucose-Capillary: 128 mg/dL — ABNORMAL HIGH (ref 65–99)
Glucose-Capillary: 139 mg/dL — ABNORMAL HIGH (ref 65–99)

## 2017-02-16 LAB — SURGICAL PCR SCREEN
MRSA, PCR: NEGATIVE
Staphylococcus aureus: NEGATIVE

## 2017-02-16 SURGERY — CYSTOURETEROSCOPY, WITH RETROGRADE PYELOGRAM AND STENT INSERTION
Anesthesia: General | Site: Abdomen | Laterality: Left

## 2017-02-16 MED ORDER — ACETAMINOPHEN 10 MG/ML IV SOLN
INTRAVENOUS | Status: AC
  Administered 2017-02-16: 1000 mg via INTRAVENOUS
  Filled 2017-02-16: qty 100

## 2017-02-16 MED ORDER — MORPHINE SULFATE (PF) 4 MG/ML IV SOLN
4.0000 mg | Freq: Once | INTRAVENOUS | Status: AC
  Administered 2017-02-16: 4 mg via INTRAVENOUS
  Filled 2017-02-16: qty 1

## 2017-02-16 MED ORDER — LACTATED RINGERS IV SOLN
INTRAVENOUS | Status: DC
  Administered 2017-02-16: 1000 mL via INTRAVENOUS

## 2017-02-16 MED ORDER — IOHEXOL 300 MG/ML  SOLN
INTRAMUSCULAR | Status: DC | PRN
  Administered 2017-02-16: 5 mL

## 2017-02-16 MED ORDER — DEXTROSE 5 % IV SOLN
2.0000 g | INTRAVENOUS | Status: DC
  Administered 2017-02-16: 2 g via INTRAVENOUS
  Filled 2017-02-16 (×3): qty 2

## 2017-02-16 MED ORDER — LIDOCAINE 2% (20 MG/ML) 5 ML SYRINGE
INTRAMUSCULAR | Status: DC | PRN
  Administered 2017-02-16: 100 mg via INTRAVENOUS

## 2017-02-16 MED ORDER — ESMOLOL HCL 100 MG/10ML IV SOLN
INTRAVENOUS | Status: AC
  Filled 2017-02-16: qty 10

## 2017-02-16 MED ORDER — MIDAZOLAM HCL 5 MG/5ML IJ SOLN
INTRAMUSCULAR | Status: DC | PRN
  Administered 2017-02-16: 2 mg via INTRAVENOUS

## 2017-02-16 MED ORDER — KETOROLAC TROMETHAMINE 30 MG/ML IJ SOLN
30.0000 mg | Freq: Four times a day (QID) | INTRAMUSCULAR | Status: DC | PRN
  Administered 2017-02-16 (×2): 30 mg via INTRAVENOUS
  Filled 2017-02-16 (×2): qty 1

## 2017-02-16 MED ORDER — ALBUTEROL SULFATE HFA 108 (90 BASE) MCG/ACT IN AERS
INHALATION_SPRAY | RESPIRATORY_TRACT | Status: DC | PRN
  Administered 2017-02-16: 5 via RESPIRATORY_TRACT

## 2017-02-16 MED ORDER — ALBUTEROL SULFATE HFA 108 (90 BASE) MCG/ACT IN AERS
INHALATION_SPRAY | RESPIRATORY_TRACT | Status: AC
  Filled 2017-02-16: qty 6.7

## 2017-02-16 MED ORDER — FENTANYL CITRATE (PF) 100 MCG/2ML IJ SOLN
INTRAMUSCULAR | Status: AC
  Filled 2017-02-16: qty 2

## 2017-02-16 MED ORDER — ACETAMINOPHEN 10 MG/ML IV SOLN
1000.0000 mg | Freq: Once | INTRAVENOUS | Status: AC
  Administered 2017-02-16: 1000 mg via INTRAVENOUS

## 2017-02-16 MED ORDER — ONDANSETRON HCL 4 MG PO TABS: 4.0000 mg | ORAL_TABLET | Freq: Four times a day (QID) | ORAL | Status: DC | PRN

## 2017-02-16 MED ORDER — FENTANYL CITRATE (PF) 100 MCG/2ML IJ SOLN: 25.0000 ug | INTRAMUSCULAR | Status: DC | PRN

## 2017-02-16 MED ORDER — PROPOFOL 10 MG/ML IV BOLUS
INTRAVENOUS | Status: AC
  Filled 2017-02-16: qty 20

## 2017-02-16 MED ORDER — SODIUM CHLORIDE 0.9 % IV SOLN: 250.0000 mL | INTRAVENOUS | Status: DC | PRN

## 2017-02-16 MED ORDER — STERILE WATER FOR IRRIGATION IR SOLN
Status: DC | PRN
  Administered 2017-02-16: 3000 mL

## 2017-02-16 MED ORDER — PROPOFOL 10 MG/ML IV BOLUS
INTRAVENOUS | Status: DC | PRN
  Administered 2017-02-16: 100 mg via INTRAVENOUS
  Administered 2017-02-16: 200 mg via INTRAVENOUS

## 2017-02-16 MED ORDER — ONDANSETRON HCL 4 MG/2ML IJ SOLN
INTRAMUSCULAR | Status: DC | PRN
  Administered 2017-02-16: 4 mg via INTRAVENOUS

## 2017-02-16 MED ORDER — MIDAZOLAM HCL 2 MG/2ML IJ SOLN
INTRAMUSCULAR | Status: AC
  Filled 2017-02-16: qty 2

## 2017-02-16 MED ORDER — FENTANYL CITRATE (PF) 100 MCG/2ML IJ SOLN
INTRAMUSCULAR | Status: DC | PRN
  Administered 2017-02-16 (×2): 50 ug via INTRAVENOUS

## 2017-02-16 MED ORDER — ORAL CARE MOUTH RINSE
15.0000 mL | Freq: Two times a day (BID) | OROMUCOSAL | Status: DC
  Administered 2017-02-16 (×3): 15 mL via OROMUCOSAL

## 2017-02-16 MED ORDER — ESMOLOL HCL 100 MG/10ML IV SOLN
INTRAVENOUS | Status: DC | PRN
  Administered 2017-02-16: 20 mg via INTRAVENOUS

## 2017-02-16 MED ORDER — SODIUM CHLORIDE 0.9% FLUSH: 3.0000 mL | INTRAVENOUS | Status: DC | PRN

## 2017-02-16 MED ORDER — SODIUM CHLORIDE 0.9% FLUSH
3.0000 mL | Freq: Two times a day (BID) | INTRAVENOUS | Status: DC
  Administered 2017-02-16 (×3): 3 mL via INTRAVENOUS

## 2017-02-16 MED ORDER — SUCCINYLCHOLINE CHLORIDE 200 MG/10ML IV SOSY
PREFILLED_SYRINGE | INTRAVENOUS | Status: DC | PRN
  Administered 2017-02-16: 140 mg via INTRAVENOUS

## 2017-02-16 MED ORDER — ONDANSETRON HCL 4 MG/2ML IJ SOLN
INTRAMUSCULAR | Status: AC
  Filled 2017-02-16: qty 2

## 2017-02-16 MED ORDER — ONDANSETRON HCL 4 MG/2ML IJ SOLN: 4.0000 mg | Freq: Four times a day (QID) | INTRAMUSCULAR | Status: DC | PRN

## 2017-02-16 SURGICAL SUPPLY — 18 items
BAG URO CATCHER STRL LF (MISCELLANEOUS) ×2 IMPLANT
BASKET ZERO TIP 1.9FR (BASKET) ×2 IMPLANT
BSKT STON RTRVL ZERO TP 1.9FR (BASKET) ×1
CATH INTERMIT  6FR 70CM (CATHETERS) ×2 IMPLANT
CLOTH BEACON ORANGE TIMEOUT ST (SAFETY) ×2 IMPLANT
COVER SURGICAL LIGHT HANDLE (MISCELLANEOUS) ×2 IMPLANT
GLOVE BIOGEL M 8.0 STRL (GLOVE) ×2 IMPLANT
GOWN STRL REUS W/TWL XL LVL3 (GOWN DISPOSABLE) ×2 IMPLANT
GUIDEWIRE STR DUAL SENSOR (WIRE) ×2 IMPLANT
IV NS 1000ML (IV SOLUTION) ×2
IV NS 1000ML BAXH (IV SOLUTION) ×1 IMPLANT
MANIFOLD NEPTUNE II (INSTRUMENTS) ×2 IMPLANT
PACK CYSTO (CUSTOM PROCEDURE TRAY) ×2 IMPLANT
SHEATH ACCESS URETERAL 24CM (SHEATH) IMPLANT
SHEATH ACCESS URETERAL 38CM (SHEATH) IMPLANT
SHEATH ACCESS URETERAL 54CM (SHEATH) IMPLANT
STENT URET 6FRX24 CONTOUR (STENTS) ×2 IMPLANT
TUBING CONNECTING 10 (TUBING) ×2 IMPLANT

## 2017-02-16 NOTE — Progress Notes (Signed)
Patient ID: [name], female   DOB: [birth year], 36 y.o.   MRN: [id number]  PROGRESS NOTE    [name]  OVF:[id number] DOB: [birth year] DOA: 02/15/2017 PCP: Leone Payor, MD   Brief Narrative:  36 y.o. female with medical history significant of obesity, DM, CAD, HTN comes in with over one day of worsening left flank. She was found to have left-sided hydronephrosis, was started on IV antibiotics and urology was consulted. She had cystoscopy done this morning.  Assessment & Plan:   Principal Problem:   Urinary tract obstruction due to kidney stone Active Problems:   Morbid obesity (HCC)   DM2 (diabetes mellitus, type 2) (HCC)   Coronary artery disease  1. Acute pyelonephritis most likely secondary to left-sided hydronephrosis and obstructive uropathy: - Continue intravenous Rocephin. Follow cultures. Patient had cystoscopy and stent placement by urology today. - Follow up with urology as an outpatient for further management of the ureteral stone. - Pain management. Start diet.  2. Moderate left-sided hydronephrosis secondary to obstructing stone in the distal left ureter: Status post cystoscopy today.  3. Leukocytosis:  probably due to above; follow cultures. Repeat a.m. Labs  4. Mild hyponatremia and hypokalemia: IV fluids. Repeat a.m. Labs  5. Diabetes mellitus type II: SSI. Hold oral meds  6. Morbid obesity: Outpatient follow-up with primary care provider  7. History of coronary artery disease, stable: Resume antiplatelet upon discharge   DVT prophylaxis: SCDs Code Status:  Full Family Communication: None present at bedside Disposition Plan: Home tomorrow if condition improves  Consultants: Urology   Procedures: Cystoscopy today  Antimicrobials: Rocephin from 02/15/2017   Subjective: Patient seen and examined at bedside. She feels much better. Her flank pain is improved. Her nausea is improving  Objective: Vitals:   02/16/17 0800 02/16/17 0815  02/16/17 0830 02/16/17 1347  BP: 135/80 125/72 117/62 120/71  Pulse: (!) 109 (!) 112 (!) 104 (!) 115  Resp: 20 (!) 21 18 18   Temp: (!) 101.9 F (38.8 C) (!) 101.3 F (38.5 C) (!) 101 F (38.3 C) 99.1 F (37.3 C)  TempSrc:    Oral  SpO2: 98% 98% 99% 99%  Weight:      Height:        Intake/Output Summary (Last 24 hours) at 02/16/17 1535 Last data filed at 02/16/17 1341  Gross per 24 hour  Intake             1427 ml  Output              500 ml  Net              927 ml   Filed Weights   02/15/17 1919 02/16/17 0625  Weight: (!) 157.4 kg (347 lb) (!) 157.4 kg (347 lb)    Examination:  General exam: Appears calm and comfortable  Respiratory system: Bilateral decreased breath sound at bases Cardiovascular system: S1 & S2 heard,Intermittently tachycardic  Gastrointestinal system: Abdomen is nondistended, soft, mild lower quadrant tenderness. Normal bowel sounds heard. Extremities: No cyanosis, clubbing, edema    Data Reviewed: I have personally reviewed following labs and imaging studies  CBC:  Recent Labs Lab 02/15/17 2000  WBC 18.7*  HGB 14.0  HCT 40.6  MCV 87.5  PLT 226   Basic Metabolic Panel:  Recent Labs Lab 02/15/17 2100  NA 134*  K 3.3*  CL 103  CO2 19*  GLUCOSE 144*  BUN 10  CREATININE 0.78  CALCIUM 8.8*   GFR: Estimated  Creatinine Clearance: 158.2 mL/min (by C-G formula based on SCr of 0.78 mg/dL). Liver Function Tests:  Recent Labs Lab 02/15/17 2100  AST 16  ALT 20  ALKPHOS 80  BILITOT 1.1  PROT 7.6  ALBUMIN 3.7    Recent Labs Lab 02/15/17 2100  LIPASE 17   No results for input(s): AMMONIA in the last 168 hours. Coagulation Profile: No results for input(s): INR, PROTIME in the last 168 hours. Cardiac Enzymes: No results for input(s): CKTOTAL, CKMB, CKMBINDEX, TROPONINI in the last 168 hours. BNP (last 3 results) No results for input(s): PROBNP in the last 8760 hours. HbA1C: No results for input(s): HGBA1C in the last 72  hours. CBG:  Recent Labs Lab 02/16/17 0640 02/16/17 0732 02/16/17 1215  GLUCAP 146* 138* 152*   Lipid Profile: No results for input(s): CHOL, HDL, LDLCALC, TRIG, CHOLHDL, LDLDIRECT in the last 72 hours. Thyroid Function Tests: No results for input(s): TSH, T4TOTAL, FREET4, T3FREE, THYROIDAB in the last 72 hours. Anemia Panel: No results for input(s): VITAMINB12, FOLATE, FERRITIN, TIBC, IRON, RETICCTPCT in the last 72 hours. Sepsis Labs:  Recent Labs Lab 02/15/17 2302  LATICACIDVEN 1.0    Recent Results (from the past 240 hour(s))  Blood culture (routine x 2)     Status: None (Preliminary result)   Collection Time: 02/15/17 11:02 PM  Result Value Ref Range Status   Specimen Description BLOOD LEFT ARM  Final   Special Requests Blood Culture adequate volume  Final   Culture NO GROWTH < 12 HOURS  Final   Report Status PENDING  Incomplete  Blood culture (routine x 2)     Status: None (Preliminary result)   Collection Time: 02/15/17 11:18 PM  Result Value Ref Range Status   Specimen Description BLOOD LEFT ARM  Final   Special Requests Blood Culture adequate volume  Final   Culture NO GROWTH < 12 HOURS  Final   Report Status PENDING  Incomplete  Surgical pcr screen     Status: None   Collection Time: 02/16/17  5:21 AM  Result Value Ref Range Status   MRSA, PCR NEGATIVE NEGATIVE Final   Staphylococcus aureus NEGATIVE NEGATIVE Final    Comment:        The Xpert SA Assay (FDA approved for NASAL specimens in patients over 69 years of age), is one component of a comprehensive surveillance program.  Test performance has been validated by [facility] for patients greater than or equal to 66 year old. It is not intended to diagnose infection nor to guide or monitor treatment.          Radiology Studies: Ct Abdomen Pelvis W Contrast  Result Date: 02/15/2017 CLINICAL DATA:  36 y/o  F; 2 days of nausea, vomiting, and diarrhea. EXAM: CT ABDOMEN AND PELVIS WITH CONTRAST  TECHNIQUE: Multidetector CT imaging of the abdomen and pelvis was performed using the standard protocol following bolus administration of intravenous contrast. CONTRAST:  ISOVUE-300 IOPAMIDOL (ISOVUE-300) INJECTION 61% COMPARISON:  None. FINDINGS: Lower chest: No acute abnormality. Hepatobiliary: No focal liver abnormality is seen. Status post cholecystectomy. No biliary dilatation. Hepatic steatosis. Pancreas: Unremarkable. No pancreatic ductal dilatation or surrounding inflammatory changes. Spleen: Normal in size without focal abnormality. Adrenals/Urinary Tract: Moderate left hydronephrosis and perinephric stranding secondary to an obstructing stone in the distal left ureter just proximal to the ureterovesicular junction measuring 4 x 4 x 8 mm (series 2, image 91 and series 5, image 93). No focal kidney lesion. No right hydronephrosis. Normal adrenal glands.  Normal bladder. Stomach/Bowel: Stomach is within normal limits. Appendix appears normal. No evidence of bowel wall thickening, distention, or inflammatory changes. Vascular/Lymphatic: Aortic atherosclerosis with minimal calcification. No enlarged abdominal or pelvic lymph nodes. Reproductive: Uterus and bilateral adnexa are unremarkable. Other: No abdominal wall hernia or abnormality. No abdominopelvic ascites. Musculoskeletal: No fracture is seen. IMPRESSION: 1. Moderate left hydronephrosis secondary to obstructing stone in the distal left ureter just proximal to the ureterovesicular junction measuring up to 8 mm. 2. Hepatic steatosis. 3. Minimal calcific aortic atherosclerosis. Electronically Signed   By: Mitzi Hansen M.D.   On: 02/15/2017 22:48   Dg C-arm 1-60 Min-no Report  Result Date: 02/16/2017 Fluoroscopy was utilized by the requesting physician.  No radiographic interpretation.        Scheduled Meds: . mouth rinse  15 mL Mouth Rinse BID  . sodium chloride flush  3 mL Intravenous Q12H   Continuous Infusions: . sodium  chloride Stopped (02/16/17 1044)  . cefTRIAXone (ROCEPHIN)  IV Stopped (02/16/17 1044)     LOS: 0 days        Glade Lloyd, MD [facility] Pager [phone]  If 7PM-7AM, please contact night-coverage [email or web address] Password [email or web address] 02/16/2017, 3:35 PM

## 2017-02-16 NOTE — Transfer of Care (Signed)
Immediate Anesthesia Transfer of Care Note  Patient: [name]  Procedure(s) Performed: Procedure(s): CYSTOSCOPY WITH RETROGRADE PYELOGRAM, URETEROSCOPY AND STENT PLACEMENT (Left)  Patient Location: PACU  Anesthesia Type:General  Level of Consciousness:  sedated, patient cooperative and responds to stimulation  Airway & Oxygen Therapy:Patient Spontanous Breathing and Patient connected to face mask oxgen  Post-op Assessment:  Report given to PACU RN and Post -op Vital signs reviewed and stable  Post vital signs:  Reviewed and stable  Last Vitals:  Vitals:   02/16/17 0103 02/16/17 0223  BP:  127/75  Pulse:  (!) 101  Resp:  20  Temp: 36.9 C 37.8 C    Complications: No apparent anesthesia complications

## 2017-02-16 NOTE — Anesthesia Postprocedure Evaluation (Signed)
Anesthesia Post Note  Patient: [name]  Procedure(s) Performed: Procedure(s) (LRB): CYSTOSCOPY WITH RETROGRADE PYELOGRAM, URETEROSCOPY AND STENT PLACEMENT (Left)  Patient location during evaluation: PACU Anesthesia Type: General Level of consciousness: awake Pain management: pain level controlled Vital Signs Assessment: post-procedure vital signs reviewed and stable Respiratory status: spontaneous breathing Anesthetic complications: no       Last Vitals:  Vitals:   02/16/17 0815 02/16/17 0830  BP: 125/72 117/62  Pulse: (!) 112 (!) 104  Resp: (!) 21 18  Temp: (!) 38.5 C (!) 38.3 C    Last Pain:  Vitals:   02/16/17 0830  TempSrc:   PainSc: 0-No pain                 Zohair Epp

## 2017-02-16 NOTE — Anesthesia Procedure Notes (Signed)
Procedure Name: Intubation Date/Time: 02/16/2017 6:52 AM Performed by: Maxwell Caul Pre-anesthesia Checklist: Patient identified, Emergency Drugs available, Suction available and Patient being monitored Patient Re-evaluated:Patient Re-evaluated prior to inductionOxygen Delivery Method: Circle system utilized Preoxygenation: Pre-oxygenation with 100% oxygen Intubation Type: IV induction, Rapid sequence and Cricoid Pressure applied Laryngoscope Size: Mac and 4 Grade View: Grade I Tube type: Oral Tube size: 7.0 mm Number of attempts: 1 Airway Equipment and Method: Stylet Placement Confirmation: ETT inserted through vocal cords under direct vision,  positive ETCO2 and breath sounds checked- equal and bilateral Secured at: 21 cm Tube secured with: Tape Dental Injury: Teeth and Oropharynx as per pre-operative assessment

## 2017-02-16 NOTE — ED Notes (Signed)
Report given to [facility] and to [place], [place]

## 2017-02-16 NOTE — Anesthesia Preprocedure Evaluation (Signed)
Anesthesia Evaluation  Patient identified by MRN, date of birth, ID band Patient awake    Reviewed: Allergy & Precautions, NPO status , Patient's Chart, lab work & pertinent test results  Airway Mallampati: II  TM Distance: >3 FB     Dental   Pulmonary Current Smoker,    breath sounds clear to auscultation       Cardiovascular hypertension, + CAD and + Past MI   Rhythm:Regular Rate:Normal     Neuro/Psych  Headaches,    GI/Hepatic Neg liver ROS, GI history noted. CG   Endo/Other  diabetes  Renal/GU Renal disease     Musculoskeletal   Abdominal   Peds  Hematology   Anesthesia Other Findings   Reproductive/Obstetrics                             Anesthesia Physical Anesthesia Plan  ASA: III  Anesthesia Plan: General   Post-op Pain Management:    Induction: Intravenous, Rapid sequence and Cricoid pressure planned  Airway Management Planned: Oral ETT  Additional Equipment:   Intra-op Plan:   Post-operative Plan: Extubation in OR  Informed Consent: I have reviewed the patients History and Physical, chart, labs and discussed the procedure including the risks, benefits and alternatives for the proposed anesthesia with the patient or authorized representative who has indicated his/her understanding and acceptance.   Dental advisory given  Plan Discussed with: CRNA and Anesthesiologist  Anesthesia Plan Comments:         Anesthesia Quick Evaluation

## 2017-02-16 NOTE — H&P (Signed)
History and Physical    [name] JQG:[id number] DOB: [birth year] DOA: 02/15/2017  PCP: Leone Payor, MD  Patient coming from: home  Chief Complaint:  Abdominal pain, left flank pain  HPI: [name] is a 36 y.o. female with medical history significant of obesity, DM, CAD, HTN comes in with over one day of worsening left flank pain that radiates into her lower abdomen with associated vomiting.  She denies fever, but has been feeling awful.  No dysuria or hematuria.  Some loose stools.  No cough.  Found to be febrile here and with obstructive kidney stone.  Pt has no h/o stones.  Nothing has helped her pain at home.  Urology called at [facility] requested transfer there in order to do procedure tonight.    Review of Systems: As per HPI otherwise 10 point review of systems negative.   Past Medical History:  Diagnosis Date  . Anxiety   . Coronary artery disease 09/2011   a. Anterior MI. PCI and BMS to LAD and angioplsty to ostial Ramus;  b. 02/2012 Cath patent stent and ptca site, med rx.  . Diabetes mellitus   . Hyperlipidemia   . Hypertension   . Migraines   . Morbid obesity (HCC)     Past Surgical History:  Procedure Laterality Date  . CARDIAC CATHETERIZATION  10/08/2011   LAD: 95% mid, Ramus: 95% ostial  . CESAREAN SECTION    . CHOLECYSTECTOMY    . CORONARY ANGIOPLASTY WITH STENT PLACEMENT  10/08/2011   LAD: BMS, Ramus: cutting balloon angioplasty  . LEFT HEART CATHETERIZATION WITH CORONARY ANGIOGRAM N/A 10/08/2011   Procedure: LEFT HEART CATHETERIZATION WITH CORONARY ANGIOGRAM;  Surgeon: Peter M Swaziland, MD;  Location: [facility] CATH LAB;  Service: Cardiovascular;  Laterality: N/A;  . LEFT HEART CATHETERIZATION WITH CORONARY ANGIOGRAM N/A 03/02/2012   Procedure: LEFT HEART CATHETERIZATION WITH CORONARY ANGIOGRAM;  Surgeon: Kathleene Hazel, MD;  Location: [facility] CATH LAB;  Service: Cardiovascular;  Laterality: N/A;  . LEFT HEART CATHETERIZATION WITH CORONARY ANGIOGRAM N/A  01/04/2014   Procedure: LEFT HEART CATHETERIZATION WITH CORONARY ANGIOGRAM;  Surgeon: Lennette Bihari, MD;  Location: [facility] CATH LAB;  Service: Cardiovascular;  Laterality: N/A;     reports that she has been smoking Cigarettes.  She started smoking about 26 years ago. She has a 10.00 pack-year smoking history. She has never used smokeless tobacco. She reports that she does not drink alcohol or use drugs.  Allergies  Allergen Reactions  . Bee Venom Swelling  . Sulfa Drugs Cross Reactors Nausea And Vomiting    Family History  Problem Relation Age of Onset  . Heart attack Father     Prior to Admission medications   Medication Sig Start Date End Date Taking? Authorizing Provider  aspirin 81 MG tablet Take 81 mg by mouth daily.    [provider]  atorvastatin (LIPITOR) 80 MG tablet Take 80 mg by mouth daily.    [provider]  carvedilol (COREG) 25 MG tablet TAKE 1 TABLET BY MOUTH TWICE DAILY WITH A MEAL 11/05/16   Antoine Poche, MD  clopidogrel (PLAVIX) 75 MG tablet TAKE 1 TABLET BY MOUTH DAILY 11/10/16   Antoine Poche, MD  losartan (COZAAR) 25 MG tablet Take 0.5 tablets (12.5 mg total) by mouth daily. 07/21/16 11/17/16  Antoine Poche, MD  medroxyPROGESTERone (PROVERA) 5 MG tablet Take 5 mg by mouth daily.    [provider]  metFORMIN (GLUCOPHAGE) 1000 MG tablet Take 1 tablet  by mouth 2 (two) times daily. 01/18/16   [provider]  nitroGLYCERIN (NITROSTAT) 0.4 MG SL tablet Place 0.4 mg under the tongue every 5 (five) minutes as needed for chest pain.    [provider]  potassium chloride SA (K-DUR,KLOR-CON) 20 MEQ tablet Take 1 tablet by mouth daily. 01/27/16   [provider]  pregabalin (LYRICA) 75 MG capsule Take 75 mg by mouth 2 (two) times daily.    [provider]  torsemide (DEMADEX) 20 MG tablet TAKE 2 TABLETS BY MOUTH DAILY 11/05/16   Antoine Poche, MD    Physical Exam: Vitals:   02/15/17 1919 02/15/17  2007 02/15/17 2230  BP:  (!) 154/101 105/74  Pulse:  94 93  Resp:  18 18  Temp:  98.1 F (36.7 C)   TempSrc:  Oral   SpO2:  98% 96%  Weight: (!) 157.4 kg (347 lb)    Height: 5\' 10"  (1.778 m)      Constitutional: NAD, calm, comfortable, obese, heavy smoke smell Vitals:   02/15/17 1919 02/15/17 2007 02/15/17 2230  BP:  (!) 154/101 105/74  Pulse:  94 93  Resp:  18 18  Temp:  98.1 F (36.7 C)   TempSrc:  Oral   SpO2:  98% 96%  Weight: (!) 157.4 kg (347 lb)    Height: 5\' 10"  (1.778 m)     Eyes: PERRL, lids and conjunctivae normal ENMT: Mucous membranes are moist. Posterior pharynx clear of any exudate or lesions.Normal dentition.  Neck: normal, supple, no masses, no thyromegaly Respiratory: clear to auscultation bilaterally, no wheezing, no crackles. Normal respiratory effort. No accessory muscle use.  Cardiovascular: Regular rate and rhythm, no murmurs / rubs / gallops. No extremity edema. 2+ pedal pulses. No carotid bruits.  Abdomen: no tenderness, no masses palpated. No hepatosplenomegaly. Bowel sounds positive.  Musculoskeletal: no clubbing / cyanosis. No joint deformity upper and lower extremities. Good ROM, no contractures. Normal muscle tone.  Skin: no rashes, lesions, ulcers. No induration Neurologic: CN 2-12 grossly intact. Sensation intact, DTR normal. Strength 5/5 in all 4.  Psychiatric: Normal judgment and insight. Alert and oriented x 3. Normal mood.    Labs on Admission: I have personally reviewed following labs and imaging studies  CBC:  Recent Labs Lab 02/15/17 2000  WBC 18.7*  HGB 14.0  HCT 40.6  MCV 87.5  PLT 226   Basic Metabolic Panel:  Recent Labs Lab 02/15/17 2100  NA 134*  K 3.3*  CL 103  CO2 19*  GLUCOSE 144*  BUN 10  CREATININE 0.78  CALCIUM 8.8*   GFR: Estimated Creatinine Clearance: 158.2 mL/min (by C-G formula based on SCr of 0.78 mg/dL). Liver Function Tests:  Recent Labs Lab 02/15/17 2100  AST 16  ALT 20  ALKPHOS 80    BILITOT 1.1  PROT 7.6  ALBUMIN 3.7    Recent Labs Lab 02/15/17 2100  LIPASE 17   Urine analysis:    Component Value Date/Time   COLORURINE YELLOW 02/15/2017 2000   APPEARANCEUR CLOUDY (A) 02/15/2017 2000   LABSPEC 1.010 02/15/2017 2000   PHURINE 6.0 02/15/2017 2000   GLUCOSEU NEGATIVE 02/15/2017 2000   HGBUR LARGE (A) 02/15/2017 2000   BILIRUBINUR NEGATIVE 02/15/2017 2000   KETONESUR NEGATIVE 02/15/2017 2000   PROTEINUR 30 (A) 02/15/2017 2000   NITRITE POSITIVE (A) 02/15/2017 2000   LEUKOCYTESUR LARGE (A) 02/15/2017 2000   Radiological Exams on Admission: Ct Abdomen Pelvis W Contrast  Result Date: 02/15/2017 CLINICAL DATA:  36 y/o  F; 2 days of nausea, vomiting, and diarrhea. EXAM: CT ABDOMEN AND PELVIS WITH CONTRAST TECHNIQUE: Multidetector CT imaging of the abdomen and pelvis was performed using the standard protocol following bolus administration of intravenous contrast. CONTRAST:  ISOVUE-300 IOPAMIDOL (ISOVUE-300) INJECTION 61% COMPARISON:  None. FINDINGS: Lower chest: No acute abnormality. Hepatobiliary: No focal liver abnormality is seen. Status post cholecystectomy. No biliary dilatation. Hepatic steatosis. Pancreas: Unremarkable. No pancreatic ductal dilatation or surrounding inflammatory changes. Spleen: Normal in size without focal abnormality. Adrenals/Urinary Tract: Moderate left hydronephrosis and perinephric stranding secondary to an obstructing stone in the distal left ureter just proximal to the ureterovesicular junction measuring 4 x 4 x 8 mm (series 2, image 91 and series 5, image 93). No focal kidney lesion. No right hydronephrosis. Normal adrenal glands. Normal bladder. Stomach/Bowel: Stomach is within normal limits. Appendix appears normal. No evidence of bowel wall thickening, distention, or inflammatory changes. Vascular/Lymphatic: Aortic atherosclerosis with minimal calcification. No enlarged abdominal or pelvic lymph nodes. Reproductive: Uterus and  bilateral adnexa are unremarkable. Other: No abdominal wall hernia or abnormality. No abdominopelvic ascites. Musculoskeletal: No fracture is seen. IMPRESSION: 1. Moderate left hydronephrosis secondary to obstructing stone in the distal left ureter just proximal to the ureterovesicular junction measuring up to 8 mm. 2. Hepatic steatosis. 3. Minimal calcific aortic atherosclerosis. Electronically Signed   By: Mitzi Hansen M.D.   On: 02/15/2017 22:48   Old chart reviewed  Case discussed with edp  Assessment/Plan 36 yo female with obstructive left kidney stone  Principal Problem:   Urinary tract obstruction due to kidney stone- npo, hold anticoagulants.  Iv rocephin.  uc pending.  Transfer to [place] for urology evaluation and services tonight likely stent placement.  Pt aware and agreeable to plan.  Stable and not septic at this time.  Active Problems:   Morbid obesity (HCC)- noted   DM2 (diabetes mellitus, type 2) (HCC)- SSI, hold oral meds   Coronary artery disease- stable, holding antiplatelet meds     DVT prophylaxis: scds Code Status:  full Family Communication: none Disposition Plan:  Per day team Consults called:   Urology at [facility] long Admission status:  observation   Kahiau Schewe A MD [facility]  If 7PM-7AM, please contact night-coverage [email or web address] Password [email or web address]  02/16/2017, 12:11 AM

## 2017-02-16 NOTE — Consult Note (Signed)
Urology Consult  CC: Referring physician: Leone Payor, MD  Reason for referral: Obstructing Lt. Ureteral stone and infection  Impression/Assessment: 1. Left distal ureteral stone - she has a left distal ureteral stone causing severe left renal colic with some hydronephrosis noted on her CT scan. She has no other renal calculi. She continues to have pain and also has infected appearing urine. As a diabetic we have discussed the need for urgent stent placement to relieve obstruction. I have discussed the options for her treatment which would include a percutaneous nephrostomy tube to relieve the obstruction versus cystoscopy with stent placement and have recommended stent placement with the understanding that if this is not successful then a nephrostomy tube would be indicated. I went over the procedure with her in detail including how this is performed, its risks and complications, the possibility of success as well as the anticipated postoperative course. She understands and has elected to proceed.    Plan:   1. Cystoscopy, left retrograde pyelogram and left double-J stent placement.   2. Continue broad-spectrum antibiotics. 3. We'll place a Foley catheter the time of surgery to improve decompression. 4. Urine and blood cultures pending.    History of Present Illness: [name] is a 36 y.o. female with past medical history as outlined below, significant for DM, HTN and CAD presenting to the [facility] ER with a 24 hour history of nausea, vomiting, diarrhea (for 12 hours yesterday, now resolved without treatment) and mid abdominal pain radiating into her left flank with associated vomiting. She also reports cloudy appearing urine but denies hematuria, increased urinary frequency or painful urination.  She has been unable to tolerate PO intake since yesterday. She denies fevers or chills  but has been feeling awful.  She has found no alleviators.  She believes she is dehydrated and reports  increased thirst. She has had no medications for her symptoms prior to arrival and also has not been able to keep down any of her chronic medications. She has a history of having one stone when she was in her teens. She continues to have what she describes as severe pain worse then labor pains.  Past Medical History:  Diagnosis Date  . Anxiety   . Coronary artery disease 09/2011   a. Anterior MI. PCI and BMS to LAD and angioplsty to ostial Ramus;  b. 02/2012 Cath patent stent and ptca site, med rx.  . Diabetes mellitus   . Hyperlipidemia   . Hypertension   . Migraines   . Morbid obesity (HCC)    Past Surgical History:  Procedure Laterality Date  . CARDIAC CATHETERIZATION  10/08/2011   LAD: 95% mid, Ramus: 95% ostial  . CESAREAN SECTION    . CHOLECYSTECTOMY    . CORONARY ANGIOPLASTY WITH STENT PLACEMENT  10/08/2011   LAD: BMS, Ramus: cutting balloon angioplasty  . LEFT HEART CATHETERIZATION WITH CORONARY ANGIOGRAM N/A 10/08/2011   Procedure: LEFT HEART CATHETERIZATION WITH CORONARY ANGIOGRAM;  Surgeon: Peter M Swaziland, MD;  Location: [facility] CATH LAB;  Service: Cardiovascular;  Laterality: N/A;  . LEFT HEART CATHETERIZATION WITH CORONARY ANGIOGRAM N/A 03/02/2012   Procedure: LEFT HEART CATHETERIZATION WITH CORONARY ANGIOGRAM;  Surgeon: Kathleene Hazel, MD;  Location: [facility] CATH LAB;  Service: Cardiovascular;  Laterality: N/A;  . LEFT HEART CATHETERIZATION WITH CORONARY ANGIOGRAM N/A 01/04/2014   Procedure: LEFT HEART CATHETERIZATION WITH CORONARY ANGIOGRAM;  Surgeon: Lennette Bihari, MD;  Location: [facility] CATH LAB;  Service: Cardiovascular;  Laterality: N/A;  Medications:  Prior to Admission:  Prescriptions Prior to Admission  Medication Sig Dispense Refill Last Dose  . aspirin 81 MG tablet Take 81 mg by mouth daily.   02/15/2017 at 1030  . atorvastatin (LIPITOR) 80 MG tablet Take 80 mg by mouth daily.   02/15/2017 at Unknown time  . carvedilol (COREG) 25 MG tablet TAKE 1 TABLET BY MOUTH TWICE  DAILY WITH A MEAL 60 tablet 6 02/15/2017 at 1030  . clopidogrel (PLAVIX) 75 MG tablet TAKE 1 TABLET BY MOUTH DAILY 30 tablet 0 02/15/2017 at 1030  . losartan (COZAAR) 25 MG tablet Take 0.5 tablets (12.5 mg total) by mouth daily. 45 tablet 3 02/15/2017 at Unknown time  . medroxyPROGESTERone (PROVERA) 5 MG tablet Take 5 mg by mouth daily.   02/15/2017 at Unknown time  . metFORMIN (GLUCOPHAGE) 1000 MG tablet Take 1 tablet by mouth 2 (two) times daily.  5 02/15/2017 at Unknown time  . nitroGLYCERIN (NITROSTAT) 0.4 MG SL tablet Place 0.4 mg under the tongue every 5 (five) minutes as needed for chest pain.   not needed  . potassium chloride SA (K-DUR,KLOR-CON) 20 MEQ tablet Take 1 tablet by mouth daily.   02/15/2017 at Unknown time  . pregabalin (LYRICA) 75 MG capsule Take 75 mg by mouth 2 (two) times daily.   02/15/2017 at Unknown time  . torsemide (DEMADEX) 20 MG tablet TAKE 2 TABLETS BY MOUTH DAILY 60 tablet 6 02/15/2017 at Unknown time   Scheduled: . sodium chloride flush  3 mL Intravenous Q12H   Continuous: . sodium chloride    . cefTRIAXone (ROCEPHIN)  IV      Allergies:  Allergies  Allergen Reactions  . Bee Venom Swelling  . Sulfa Drugs Cross Reactors Nausea And Vomiting    Family History  Problem Relation Age of Onset  . Heart attack Father     Social History:  reports that she has been smoking Cigarettes.  She started smoking about 26 years ago. She has a 10.00 pack-year smoking history. She has never used smokeless tobacco. She reports that she does not drink alcohol or use drugs.  Review of Systems (10 point): Pertinent items are noted in HPI. A comprehensive review of systems was negative except as noted above.  Physical Exam:  Vital signs in last 24 hours: Temp:  [98.1 F (36.7 C)-100 F (37.8 C)] 100 F (37.8 C) (05/23 0223) Pulse Rate:  [93-101] 101 (05/23 0223) Resp:  [18-20] 20 (05/23 0223) BP: (105-154)/(74-101) 127/75 (05/23 0223) SpO2:  [96 %-100 %] 100 % (05/23  0223) Weight:  [347 lb (157.4 kg)] 347 lb (157.4 kg) (05/22 1919) General appearance: alert and appears stated age, morbidly obese Head: Normocephalic, without obvious abnormality, atraumatic Eyes: conjunctivae/corneas clear. EOM's intact.  Oropharynx: moist mucous membranes Neck: supple, symmetrical, trachea midline Resp: normal respiratory effort Cardio: regular rate and rhythm Back: symmetric, no curvature. ROM normal. Left CVA tenderness. GI: soft, non-tender; bowel sounds normal; no masses,  no organomegaly Extremities: extremities normal, atraumatic, no cyanosis or edema Skin: Skin color normal. No visible rashes or lesions Neurologic: Grossly normal  Laboratory Data:   Recent Labs  02/15/17 2000  WBC 18.7*  HGB 14.0  HCT 40.6   BMET  Recent Labs  02/15/17 2100  NA 134*  K 3.3*  CL 103  CO2 19*  GLUCOSE 144*  BUN 10  CREATININE 0.78  CALCIUM 8.8*   No results for input(s): LABPT, INR in the last 72 hours. No results for input(s): LABURIN  in the last 72 hours. Results for orders placed or performed during the hospital encounter of 02/15/17  Blood culture (routine x 2)     Status: None (Preliminary result)   Collection Time: 02/15/17 11:02 PM  Result Value Ref Range Status   Specimen Description BLOOD LEFT ARM  Final   Special Requests Blood Culture adequate volume  Final   Culture PENDING  Incomplete   Report Status PENDING  Incomplete  Blood culture (routine x 2)     Status: None (Preliminary result)   Collection Time: 02/15/17 11:18 PM  Result Value Ref Range Status   Specimen Description BLOOD LEFT ARM  Final   Special Requests Blood Culture adequate volume  Final   Culture PENDING  Incomplete   Report Status PENDING  Incomplete   Creatinine:  Recent Labs  02/15/17 2100  CREATININE 0.78    Imaging: Ct Abdomen Pelvis W Contrast  Result Date: 02/15/2017 CLINICAL DATA:  36 y/o  F; 2 days of nausea, vomiting, and diarrhea. EXAM: CT ABDOMEN AND  PELVIS WITH CONTRAST TECHNIQUE: Multidetector CT imaging of the abdomen and pelvis was performed using the standard protocol following bolus administration of intravenous contrast. CONTRAST:  ISOVUE-300 IOPAMIDOL (ISOVUE-300) INJECTION 61% COMPARISON:  None. FINDINGS: Lower chest: No acute abnormality. Hepatobiliary: No focal liver abnormality is seen. Status post cholecystectomy. No biliary dilatation. Hepatic steatosis. Pancreas: Unremarkable. No pancreatic ductal dilatation or surrounding inflammatory changes. Spleen: Normal in size without focal abnormality. Adrenals/Urinary Tract: Moderate left hydronephrosis and perinephric stranding secondary to an obstructing stone in the distal left ureter just proximal to the ureterovesicular junction measuring 4 x 4 x 8 mm (series 2, image 91 and series 5, image 93). No focal kidney lesion. No right hydronephrosis. Normal adrenal glands. Normal bladder. Stomach/Bowel: Stomach is within normal limits. Appendix appears normal. No evidence of bowel wall thickening, distention, or inflammatory changes. Vascular/Lymphatic: Aortic atherosclerosis with minimal calcification. No enlarged abdominal or pelvic lymph nodes. Reproductive: Uterus and bilateral adnexa are unremarkable. Other: No abdominal wall hernia or abnormality. No abdominopelvic ascites. Musculoskeletal: No fracture is seen. IMPRESSION: 1. Moderate left hydronephrosis secondary to obstructing stone in the distal left ureter just proximal to the ureterovesicular junction measuring up to 8 mm. 2. Hepatic steatosis. 3. Minimal calcific aortic atherosclerosis. Electronically Signed   By: Mitzi Hansen M.D.   On: 02/15/2017 22:48       [name] C 02/16/2017, 4:02 AM

## 2017-02-16 NOTE — Op Note (Signed)
PATIENT:  [name]  PRE-OPERATIVE DIAGNOSIS:  left Ureteral calculus with infection  POST-OPERATIVE DIAGNOSIS: Same  PROCEDURE:  1. Cystoscopy with left retrograde pyelogram including interpretation 2.   Left double-J stent placement. 3.  Fluoroscopy time less than 1 hour.  SURGEON: Garnett Farm, MD  INDICATION: [name] is a 36 year old female who is a diabetic and has a distal left ureteral stone causing obstruction and hydronephrosis but also has an obvious UTI with an elevated white blood cell count and early signs of sepsis.  She is brought to the operating room for stent placement.  ANESTHESIA:  General  EBL:  Minimal  DRAINS: 6 French, 24 cm double-J stent in the left ureter  SPECIMEN:  None  DESCRIPTION OF PROCEDURE: The patient was taken to the major OR and placed on the table. General anesthesia was administered and then the patient was moved to the dorsal lithotomy position. The genitalia was sterilely prepped and draped. An official timeout was performed.  Initially the 23 French cystoscope with 30 lens was passed under direct vision into the bladder. The bladder was then fully inspected.The urine was noted to be turbid. It was noted be free of any tumors, stones or inflammatory lesions. Ureteral orifices were of normal configuration and position. A 6 French open-ended ureteral catheter was then passed through the cystoscope into the ureteral orifice in order to perform a left retrograde pyelogram.  A retrograde pyelogram was performed by injecting full-strength contrast up the left ureter under direct fluoroscopic control. It revealed a filling defect in the distal left ureter consistent with the stone seen on the preoperative CT. The remainder of the didtal ureter was noted to be normal however because of the presence of active infection I did not inject contrast all the way up to the intrarenal collecting system in order to minimize pressure in the system. I  then passed a 0.038 inch floppy-tipped guidewire through the open ended catheter and into the area of the renal pelvis and this was left in place.    I then backloaded the cystoscope over the guidewire and passed the stent over the guidewire into the area of the renal pelvis. As the guidewire was removed good curl was noted in the renal pelvis. The bladder was drained and the cystoscope was then removed. The patient tolerated the procedure well no intraoperative complications.  PLAN OF CARE: Discharge to home after PACU  PATIENT DISPOSITION:  PACU - hemodynamically stable.

## 2017-02-17 ENCOUNTER — Encounter (HOSPITAL_COMMUNITY): Payer: Self-pay | Admitting: Urology

## 2017-02-17 DIAGNOSIS — N200 Calculus of kidney: Secondary | ICD-10-CM | POA: Diagnosis not present

## 2017-02-17 DIAGNOSIS — N138 Other obstructive and reflux uropathy: Secondary | ICD-10-CM | POA: Diagnosis not present

## 2017-02-17 LAB — CBC WITH DIFFERENTIAL/PLATELET
Basophils Absolute: 0 10*3/uL (ref 0.0–0.1)
Basophils Relative: 0 %
Eosinophils Absolute: 0 10*3/uL (ref 0.0–0.7)
Eosinophils Relative: 0 %
HCT: 34.5 % — ABNORMAL LOW (ref 36.0–46.0)
Hemoglobin: 11.7 g/dL — ABNORMAL LOW (ref 12.0–15.0)
Lymphocytes Relative: 13 %
Lymphs Abs: 1.9 10*3/uL (ref 0.7–4.0)
MCH: 29.6 pg (ref 26.0–34.0)
MCHC: 33.9 g/dL (ref 30.0–36.0)
MCV: 87.3 fL (ref 78.0–100.0)
Monocytes Absolute: 1.4 10*3/uL — ABNORMAL HIGH (ref 0.1–1.0)
Monocytes Relative: 10 %
Neutro Abs: 11.5 10*3/uL — ABNORMAL HIGH (ref 1.7–7.7)
Neutrophils Relative %: 77 %
Platelets: 202 10*3/uL (ref 150–400)
RBC: 3.95 MIL/uL (ref 3.87–5.11)
RDW: 13.3 % (ref 11.5–15.5)
WBC: 14.9 10*3/uL — ABNORMAL HIGH (ref 4.0–10.5)

## 2017-02-17 LAB — BASIC METABOLIC PANEL
Anion gap: 9 (ref 5–15)
BUN: 13 mg/dL (ref 6–20)
CO2: 24 mmol/L (ref 22–32)
Calcium: 9.2 mg/dL (ref 8.9–10.3)
Chloride: 101 mmol/L (ref 101–111)
Creatinine, Ser: 0.84 mg/dL (ref 0.44–1.00)
GFR calc Af Amer: >60 mL/min (ref >60)
GFR calc non Af Amer: >60 mL/min (ref >60)
Glucose, Bld: 145 mg/dL — ABNORMAL HIGH (ref 65–99)
Potassium: 3.2 mmol/L — ABNORMAL LOW (ref 3.5–5.1)
Sodium: 134 mmol/L — ABNORMAL LOW (ref 135–145)

## 2017-02-17 LAB — GLUCOSE, CAPILLARY: Glucose-Capillary: 136 mg/dL — ABNORMAL HIGH (ref 65–99)

## 2017-02-17 MED ORDER — LEVOFLOXACIN 750 MG PO TABS: 750.0000 mg | ORAL_TABLET | Freq: Every day | ORAL | 0 refills | Status: AC

## 2017-02-17 MED ORDER — ONDANSETRON HCL 4 MG PO TABS: 4.0000 mg | ORAL_TABLET | Freq: Four times a day (QID) | ORAL | 0 refills | Status: DC | PRN

## 2017-02-17 MED ORDER — TRAMADOL HCL 50 MG PO TABS: 50.0000 mg | ORAL_TABLET | Freq: Four times a day (QID) | ORAL | 0 refills | Status: DC | PRN

## 2017-02-17 NOTE — Discharge Instructions (Signed)

## 2017-02-17 NOTE — Discharge Summary (Signed)
Physician Discharge Summary  [name] UEA:[id number] DOB: December 05, 1980 DOA: 02/15/2017  PCP: Leone Payor, MD  Admit date: 02/15/2017 Discharge date: 02/17/2017  Admitted From: Home Disposition:  Home  Recommendations for Outpatient Follow-up:  1. Follow up with PCP in 1-2 weeks 2. Follow-up with urology/Dr. Vernie Ammons in 1week   Home Health: No Equipment/Devices: None Discharge Condition: Stable CODE STATUS: Full Diet recommendation: Heart Healthy / Carb Modified  Brief/Interim Summary: 36 y.o.femalewith medical history significant of obesity, DM, CAD, HTN comes in with over one day of worsening left flank. She was found to have left-sided hydronephrosis, was started on IV antibiotics and urology was consulted. She had cystoscopy and stent placed on 02/16/2017. Urine culture is growing gram-negative rods; identification is pending. She is [place] having low-grade temperatures. She wants to really go home today.  Discharge Diagnoses:  Principal Problem:   Urinary tract obstruction due to kidney stone Active Problems:   Morbid obesity (HCC)   DM2 (diabetes mellitus, type 2) (HCC)   Coronary artery disease  1. Acute pyelonephritis most likely secondary to left-sided hydronephrosis and obstructive uropathy: - Currently on Rocephin. Cultures negative so far. Patient had cystoscopy and stent placement by urology on 02/16/2017. Urine culture is growing gram-negative rods, identification is pending. Patient is [place] having some some low-grade temperatures. I advised her that she shouldn't be monitored in the hospital for 1 more day but she has refused to stay an extra day and wants to go home on oral antibiotics. Discharge patient on oral Levaquin for 2 weeks. Urine culture can be followed up as an outpatient by primary care provider or the urologist. If worsening symptoms, patient was advised to return back to the emergency room. - Follow up with urology as an outpatient for further  management of the ureteral stone.  2. Moderate left-sided hydronephrosis secondary to obstructing stone in the distal left ureter: Status post cystoscopy and stent placement.  3. Leukocytosis:  probably due to above; improving  4. Mild hyponatremia and hypokalemia: Stable  5. Diabetes mellitus type II: Resume outpatient oral meds. Outpatient follow-up  6. Morbid obesity: Outpatient follow-up with primary care provider  7. History of coronary artery disease, stable: Resume antiplatelet upon discharge. Follow-up with urology regarding timing of repeat procedure and when to stop antiplatelets.  Discharge Instructions  Discharge Instructions    Call MD for:  difficulty breathing, headache or visual disturbances    Complete by:  As directed    Call MD for:  extreme fatigue    Complete by:  As directed    Call MD for:  persistant dizziness or light-headedness    Complete by:  As directed    Call MD for:  persistant nausea and vomiting    Complete by:  As directed    Call MD for:  severe uncontrolled pain    Complete by:  As directed    Call MD for:  temperature >100.4    Complete by:  As directed    Diet - low sodium heart healthy    Complete by:  As directed    Diet Carb Modified    Complete by:  As directed    Increase activity slowly    Complete by:  As directed      Allergies as of 02/17/2017      Reactions   Bee Venom Swelling   Sulfa Drugs Cross Reactors Nausea And Vomiting      Medication List    STOP taking these medications  losartan 25 MG tablet Commonly known as:  COZAAR     TAKE these medications   aspirin 81 MG tablet Take 81 mg by mouth daily.   atorvastatin 80 MG tablet Commonly known as:  LIPITOR Take 80 mg by mouth daily.   carvedilol 25 MG tablet Commonly known as:  COREG TAKE 1 TABLET BY MOUTH TWICE DAILY WITH A MEAL   clopidogrel 75 MG tablet Commonly known as:  PLAVIX TAKE 1 TABLET BY MOUTH DAILY   levofloxacin 750 MG  tablet Commonly known as:  LEVAQUIN Take 1 tablet (750 mg total) by mouth daily.   medroxyPROGESTERone 5 MG tablet Commonly known as:  PROVERA Take 5 mg by mouth daily.   metFORMIN 1000 MG tablet Commonly known as:  GLUCOPHAGE Take 1 tablet by mouth 2 (two) times daily.   nitroGLYCERIN 0.4 MG SL tablet Commonly known as:  NITROSTAT Place 0.4 mg under the tongue every 5 (five) minutes as needed for chest pain.   ondansetron 4 MG tablet Commonly known as:  ZOFRAN Take 1 tablet (4 mg total) by mouth every 6 (six) hours as needed for nausea.   potassium chloride SA 20 MEQ tablet Commonly known as:  K-DUR,KLOR-CON Take 1 tablet by mouth daily.   pregabalin 75 MG capsule Commonly known as:  LYRICA Take 75 mg by mouth 2 (two) times daily.   torsemide 20 MG tablet Commonly known as:  DEMADEX TAKE 2 TABLETS BY MOUTH DAILY   traMADol 50 MG tablet Commonly known as:  ULTRAM Take 1 tablet (50 mg total) by mouth every 6 (six) hours as needed.      Follow-up Information    Call Ihor Gully, MD.   Specialty:  Urology Why:  For an appointment in ~1 week when you get home. Contact information: [address] [place] [address] [phone]        Leone Payor, MD Follow up in 1 week(s).   Specialty:  Internal Medicine         Allergies  Allergen Reactions  . Bee Venom Swelling  . Sulfa Drugs Cross Reactors Nausea And Vomiting    Consultations: Urology/Dr. Vernie Ammons Procedures/Studies: Ct Abdomen Pelvis W Contrast  Result Date: 02/15/2017 CLINICAL DATA:  36 y/o  F; 2 days of nausea, vomiting, and diarrhea. EXAM: CT ABDOMEN AND PELVIS WITH CONTRAST TECHNIQUE: Multidetector CT imaging of the abdomen and pelvis was performed using the standard protocol following bolus administration of intravenous contrast. CONTRAST:  ISOVUE-300 IOPAMIDOL (ISOVUE-300) INJECTION 61% COMPARISON:  None. FINDINGS: Lower chest: No acute abnormality. Hepatobiliary: No focal liver  abnormality is seen. Status post cholecystectomy. No biliary dilatation. Hepatic steatosis. Pancreas: Unremarkable. No pancreatic ductal dilatation or surrounding inflammatory changes. Spleen: Normal in size without focal abnormality. Adrenals/Urinary Tract: Moderate left hydronephrosis and perinephric stranding secondary to an obstructing stone in the distal left ureter just proximal to the ureterovesicular junction measuring 4 x 4 x 8 mm (series 2, image 91 and series 5, image 93). No focal kidney lesion. No right hydronephrosis. Normal adrenal glands. Normal bladder. Stomach/Bowel: Stomach is within normal limits. Appendix appears normal. No evidence of bowel wall thickening, distention, or inflammatory changes. Vascular/Lymphatic: Aortic atherosclerosis with minimal calcification. No enlarged abdominal or pelvic lymph nodes. Reproductive: Uterus and bilateral adnexa are unremarkable. Other: No abdominal wall hernia or abnormality. No abdominopelvic ascites. Musculoskeletal: No fracture is seen. IMPRESSION: 1. Moderate left hydronephrosis secondary to obstructing stone in the distal left ureter just proximal to the ureterovesicular junction measuring  up to 8 mm. 2. Hepatic steatosis. 3. Minimal calcific aortic atherosclerosis. Electronically Signed   By: Mitzi Hansen M.D.   On: 02/15/2017 22:48   Dg C-arm 1-60 Min-no Report  Result Date: 02/16/2017 Fluoroscopy was utilized by the requesting physician.  No radiographic interpretation.    Cystoscopy and stent placement on 02/16/2017   Subjective: Patient seen and examined at bedside. She feels better but complains of soreness all over. No current nausea or vomiting. Discharge Exam: Vitals:   02/16/17 2100 02/17/17 0533  BP: 117/69 124/81  Pulse: (!) 101 (!) 103  Resp: 20 20  Temp: 99.4 F (37.4 C) 100.3 F (37.9 C)   Vitals:   02/16/17 0830 02/16/17 1347 02/16/17 2100 02/17/17 0533  BP: 117/62 120/71 117/69 124/81  Pulse: (!) 104  (!) 115 (!) 101 (!) 103  Resp: 18 18 20 20   Temp: (!) 101 F (38.3 C) 99.1 F (37.3 C) 99.4 F (37.4 C) 100.3 F (37.9 C)  TempSrc:  Oral Oral Oral  SpO2: 99% 99% 96% 97%  Weight:      Height:        General: Pt is alert, awake, not in acute distress Cardiovascular: RRR, Intermittent tachycardia Respiratory: Bilateral decreased breath sound at bases  Abdominal: Soft, NT, ND, bowel sounds +; morbidly obese Extremities: no edema, no cyanosis, no clubbing    The results of significant diagnostics from this hospitalization (including imaging, microbiology, ancillary and laboratory) are listed below for reference.     Microbiology: Recent Results (from the past 240 hour(s))  Urine culture     Status: Abnormal (Preliminary result)   Collection Time: 02/15/17  9:12 PM  Result Value Ref Range Status   Specimen Description URINE, CLEAN CATCH  Final   Special Requests NONE  Final   Culture >=100,000 COLONIES/mL GRAM NEGATIVE RODS (A)  Final   Report Status PENDING  Incomplete  Blood culture (routine x 2)     Status: None (Preliminary result)   Collection Time: 02/15/17 11:02 PM  Result Value Ref Range Status   Specimen Description BLOOD LEFT ARM  Final   Special Requests Blood Culture adequate volume  Final   Culture NO GROWTH 2 DAYS  Final   Report Status PENDING  Incomplete  Blood culture (routine x 2)     Status: None (Preliminary result)   Collection Time: 02/15/17 11:18 PM  Result Value Ref Range Status   Specimen Description BLOOD LEFT ARM  Final   Special Requests Blood Culture adequate volume  Final   Culture NO GROWTH 2 DAYS  Final   Report Status PENDING  Incomplete  Surgical pcr screen     Status: None   Collection Time: 02/16/17  5:21 AM  Result Value Ref Range Status   MRSA, PCR NEGATIVE NEGATIVE Final   Staphylococcus aureus NEGATIVE NEGATIVE Final    Comment:        The Xpert SA Assay (FDA approved for NASAL specimens in patients over 18 years of age), is  one component of a comprehensive surveillance program.  Test performance has been validated by [facility] for patients greater than or equal to 17 year old. It is not intended to diagnose infection nor to guide or monitor treatment.      Labs: BNP (last 3 results) No results for input(s): BNP in the last 8760 hours. Basic Metabolic Panel:  Recent Labs Lab 02/15/17 2100 02/17/17 0457  NA 134* 134*  K 3.3* 3.2*  CL 103 101  CO2  19* 24  GLUCOSE 144* 145*  BUN 10 13  CREATININE 0.78 0.84  CALCIUM 8.8* 9.2   Liver Function Tests:  Recent Labs Lab 02/15/17 2100  AST 16  ALT 20  ALKPHOS 80  BILITOT 1.1  PROT 7.6  ALBUMIN 3.7    Recent Labs Lab 02/15/17 2100  LIPASE 17   No results for input(s): AMMONIA in the last 168 hours. CBC:  Recent Labs Lab 02/15/17 2000 02/17/17 0457  WBC 18.7* 14.9*  NEUTROABS  --  11.5*  HGB 14.0 11.7*  HCT 40.6 34.5*  MCV 87.5 87.3  PLT 226 202   Cardiac Enzymes: No results for input(s): CKTOTAL, CKMB, CKMBINDEX, TROPONINI in the last 168 hours. BNP: Invalid input(s): POCBNP CBG:  Recent Labs Lab 02/16/17 0732 02/16/17 1215 02/16/17 1657 02/16/17 2104 02/17/17 0736  GLUCAP 138* 152* 128* 139* 136*   D-Dimer No results for input(s): DDIMER in the last 72 hours. Hgb A1c No results for input(s): HGBA1C in the last 72 hours. Lipid Profile No results for input(s): CHOL, HDL, LDLCALC, TRIG, CHOLHDL, LDLDIRECT in the last 72 hours. Thyroid function studies No results for input(s): TSH, T4TOTAL, T3FREE, THYROIDAB in the last 72 hours.  Invalid input(s): FREET3 Anemia work up No results for input(s): VITAMINB12, FOLATE, FERRITIN, TIBC, IRON, RETICCTPCT in the last 72 hours. Urinalysis    Component Value Date/Time   COLORURINE YELLOW 02/15/2017 2000   APPEARANCEUR CLOUDY (A) 02/15/2017 2000   LABSPEC 1.010 02/15/2017 2000   PHURINE 6.0 02/15/2017 2000   GLUCOSEU NEGATIVE 02/15/2017 2000   HGBUR LARGE (A)  02/15/2017 2000   BILIRUBINUR NEGATIVE 02/15/2017 2000   KETONESUR NEGATIVE 02/15/2017 2000   PROTEINUR 30 (A) 02/15/2017 2000   NITRITE POSITIVE (A) 02/15/2017 2000   LEUKOCYTESUR LARGE (A) 02/15/2017 2000   Sepsis Labs Invalid input(s): PROCALCITONIN,  WBC,  LACTICIDVEN Microbiology Recent Results (from the past 240 hour(s))  Urine culture     Status: Abnormal (Preliminary result)   Collection Time: 02/15/17  9:12 PM  Result Value Ref Range Status   Specimen Description URINE, CLEAN CATCH  Final   Special Requests NONE  Final   Culture >=100,000 COLONIES/mL GRAM NEGATIVE RODS (A)  Final   Report Status PENDING  Incomplete  Blood culture (routine x 2)     Status: None (Preliminary result)   Collection Time: 02/15/17 11:02 PM  Result Value Ref Range Status   Specimen Description BLOOD LEFT ARM  Final   Special Requests Blood Culture adequate volume  Final   Culture NO GROWTH 2 DAYS  Final   Report Status PENDING  Incomplete  Blood culture (routine x 2)     Status: None (Preliminary result)   Collection Time: 02/15/17 11:18 PM  Result Value Ref Range Status   Specimen Description BLOOD LEFT ARM  Final   Special Requests Blood Culture adequate volume  Final   Culture NO GROWTH 2 DAYS  Final   Report Status PENDING  Incomplete  Surgical pcr screen     Status: None   Collection Time: 02/16/17  5:21 AM  Result Value Ref Range Status   MRSA, PCR NEGATIVE NEGATIVE Final   Staphylococcus aureus NEGATIVE NEGATIVE Final    Comment:        The Xpert SA Assay (FDA approved for NASAL specimens in patients over 66 years of age), is one component of a comprehensive surveillance program.  Test performance has been validated by [facility] for patients greater than or equal to 1 year  old. It is not intended to diagnose infection nor to guide or monitor treatment.      Time coordinating discharge: Over 30 minutes  SIGNED:   Glade Lloyd, MD  [facility] 02/17/2017,  9:44 AM Pager: [phone]  If 7PM-7AM, please contact night-coverage [email or web address] Password [email or web address]

## 2017-02-17 NOTE — Progress Notes (Signed)
Assessment: Left ureteral stone with obstruction and sepsis - She appears to be doing much better. She had a fever spike immediately after stent placement which is not unexpected. Since then she has remained afebrile. Her white blood cell count is trending toward normal. She remains on antibiotics. Her culture results remain pending. Once her culture returns she can be placed on oral antibiotics for 2 weeks and will follow-up with me as an outpatient to arrange treatment of her ureteral stone.  Plan: 1. Her Foley catheter can be removed. 2. Continue antibiotics. 3. Await culture results.     Subjective: Patient reports generalized muscle aches but no flank pain or bladder irritation from her stent.  Objective: Vital signs in last 24 hours: Temp:  [99.1 F (37.3 C)-102 F (38.9 C)] 100.3 F (37.9 C) (05/24 0533) Pulse Rate:  [101-115] 103 (05/24 0533) Resp:  [18-21] 20 (05/24 0533) BP: (117-136)/(62-87) 124/81 (05/24 0533) SpO2:  [96 %-100 %] 97 % (05/24 0533)A  Intake/Output from previous day: 05/23 0701 - 05/24 0700 In: 1427 [P.O.:702; I.V.:675; IV Piggyback:50] Out: 1300 [Urine:1300] Intake/Output this shift: Total I/O In: 0  Out: 400 [Urine:400]  Past Medical History:  Diagnosis Date  . Anxiety   . Coronary artery disease 09/2011   a. Anterior MI. PCI and BMS to LAD and angioplsty to ostial Ramus;  b. 02/2012 Cath patent stent and ptca site, med rx.  . Diabetes mellitus   . Hyperlipidemia   . Hypertension   . Migraines   . Morbid obesity (HCC)     Physical Exam:  Lungs - Normal respiratory effort, chest expands symmetrically.  Abdomen - Soft, non-tender & non-distended.  Lab Results:  Recent Labs  02/15/17 2000 02/17/17 0457  WBC 18.7* 14.9*  HGB 14.0 11.7*  HCT 40.6 34.5*   BMET  Recent Labs  02/15/17 2100 02/17/17 0457  NA 134* 134*  K 3.3* 3.2*  CL 103 101  CO2 19* 24  GLUCOSE 144* 145*  BUN 10 13  CREATININE 0.78 0.84  CALCIUM 8.8* 9.2    No results for input(s): LABURIN in the last 72 hours. Results for orders placed or performed during the hospital encounter of 02/15/17  Blood culture (routine x 2)     Status: None (Preliminary result)   Collection Time: 02/15/17 11:02 PM  Result Value Ref Range Status   Specimen Description BLOOD LEFT ARM  Final   Special Requests Blood Culture adequate volume  Final   Culture NO GROWTH < 12 HOURS  Final   Report Status PENDING  Incomplete  Blood culture (routine x 2)     Status: None (Preliminary result)   Collection Time: 02/15/17 11:18 PM  Result Value Ref Range Status   Specimen Description BLOOD LEFT ARM  Final   Special Requests Blood Culture adequate volume  Final   Culture NO GROWTH < 12 HOURS  Final   Report Status PENDING  Incomplete  Surgical pcr screen     Status: None   Collection Time: 02/16/17  5:21 AM  Result Value Ref Range Status   MRSA, PCR NEGATIVE NEGATIVE Final   Staphylococcus aureus NEGATIVE NEGATIVE Final    Comment:        The Xpert SA Assay (FDA approved for NASAL specimens in patients over 92 years of age), is one component of a comprehensive surveillance program.  Test performance has been validated by [facility] for patients greater than or equal to 73 year old. It is not intended to diagnose  infection nor to guide or monitor treatment.     Studies/Results: Dg C-arm 1-60 Min-no Report  Result Date: 02/16/2017 Fluoroscopy was utilized by the requesting physician.  No radiographic interpretation.      [place] 02/17/2017, 6:57 AM

## 2017-02-18 LAB — URINE CULTURE: Culture: >=100000 — AB

## 2017-02-20 LAB — CULTURE, BLOOD (ROUTINE X 2)
Culture: NO GROWTH
Culture: NO GROWTH
Special Requests: ADEQUATE
Special Requests: ADEQUATE

## 2017-03-09 ENCOUNTER — Other Ambulatory Visit: Payer: Self-pay | Admitting: Urology

## 2017-03-18 NOTE — Patient Instructions (Signed)
[name]  03/18/2017     @PREFPERIOPPHARMACY @   Your procedure is scheduled on  04/22/2017  Report to [facility] at  615  A.M.  Call this number if you have problems the morning of surgery:  [phone]   Remember:  Do not eat food or drink liquids after midnight.  Take these medicines the morning of surgery with A SIP OF WATER  Coreg, cozaar, lyrica.   Do not wear jewelry, make-up or nail polish.  Do not wear lotions, powders, or perfumes, or deoderant.  Do not shave 48 hours prior to surgery.  Men may shave face and neck.  Do not bring valuables to the hospital.  [facility] is not responsible for any belongings or valuables.  Contacts, dentures or bridgework may not be worn into surgery.  Leave your suitcase in the car.  After surgery it may be brought to your room.  For patients admitted to the hospital, discharge time will be determined by your treatment team.  Patients discharged the day of surgery will not be allowed to drive home.   Name and phone number of your driver:   Family   Special instructions:  None  Please read over the following fact sheets that you were given. Anesthesia Post-op Instructions and Care and Recovery After Surgery       Lithotripsy Lithotripsy is a treatment that can sometimes help eliminate kidney stones and the pain that they cause. A form of lithotripsy, also known as extracorporeal shock wave lithotripsy, is a nonsurgical procedure that crushes a kidney stone with shock waves. These shock waves pass through your body and focus on the kidney stone. They cause the kidney stone to break up while it is [place] in the urinary tract. This makes it easier for the smaller pieces of stone to pass in the urine. Tell a health care provider about:  Any allergies you have.  All medicines you are taking, including vitamins, herbs, eye drops, creams, and over-the-counter medicines.  Any blood disorders you  have.  Any surgeries you have had.  Any medical conditions you have.  Whether you are pregnant or may be pregnant.  Any problems you or family members have had with anesthetic medicines. What are the risks? Generally, this is a safe procedure. However, problems may occur, including:  Infection.  Bleeding of the kidney.  Bruising of the kidney or skin.  Scarring of the kidney, which can lead to: ? Increased blood pressure. ? Poor kidney function. ? Return (recurrence) of kidney stones.  Damage to other structures or organs, such as the liver, colon, spleen, or pancreas.  Blockage (obstruction) of the the tube that carries urine from the kidney to the bladder (ureter).  Failure of the kidney stone to break into pieces (fragments).  What happens before the procedure? Staying hydrated Follow instructions from your health care provider about hydration, which may include:  Up to 2 hours before the procedure - you may continue to drink clear liquids, such as water, clear fruit juice, black coffee, and plain tea.  Eating and drinking restrictions Follow instructions from your health care provider about eating and drinking, which may include:  8 hours before the procedure - stop eating heavy meals or foods such as meat, fried foods, or fatty foods.  6 hours before the procedure - stop eating light meals or foods, such as toast or cereal.  6  hours before the procedure - stop drinking milk or drinks that contain milk.  2 hours before the procedure - stop drinking clear liquids.  General instructions  Plan to have someone take you home from the hospital or clinic.  Ask your health care provider about: ? Changing or stopping your regular medicines. This is especially important if you are taking diabetes medicines or blood thinners. ? Taking medicines such as aspirin and ibuprofen. These medicines and other NSAIDs can thin your blood. Do not take these medicines for 7 days  before your procedure if your health care provider instructs you not to.  You may have tests, such as: ? Blood tests. ? Urine tests. ? Imaging tests, such as a CT scan. What happens during the procedure?  To lower your risk of infection: ? Your health care team will wash or sanitize their hands. ? Your skin will be washed with soap.  An IV tube will be inserted into one of your veins. This tube will give you fluids and medicines.  You will be given one or more of the following: ? A medicine to help you relax (sedative). ? A medicine to make you fall asleep (general anesthetic).  A water-filled cushion may be placed behind your kidney or on your abdomen. In some cases you may be placed in a tub of lukewarm water.  Your body will be positioned in a way that makes it easy to target the kidney stone.  A flexible tube with holes in it (stent) may be placed in the ureter. This will help keep urine flowing from the kidney if the fragments of the stone have been blocking the ureter.  An X-ray or ultrasound exam will be done to locate your stone.  Shock waves will be aimed at the stone. If you are awake, you may feel a tapping sensation as the shock waves pass through your body. The procedure may vary among health care providers and hospitals. What happens after the procedure?  You may have an X-ray to see whether the procedure was able to break up the kidney stone and how much of the stone has passed. If large stone fragments remain after treatment, you may need to have a second procedure at a later time.  Your blood pressure, heart rate, breathing rate, and blood oxygen level will be monitored until the medicines you were given have worn off.  You may be given antibiotics or pain medicine as needed.  If a stent was placed in your ureter during surgery, it may stay in place for a few weeks.  You may need strain your urine to collect pieces of the kidney stone for testing.  You will  need to drink plenty of water.  Do not drive for 24 hours if you were given a sedative. Summary  Lithotripsy is a treatment that can sometimes help eliminate kidney stones and the pain that they cause.  A form of lithotripsy, also known as extracorporeal shock wave lithotripsy, is a nonsurgical procedure that crushes a kidney stone with shock waves.  Generally, this is a safe procedure. However, problems may occur, including damage to the kidney or other organs, infection, or obstruction of the tube that carries urine from the kidney to the bladder (ureter).  When you go home, you will need to drink plenty of water. You may be asked to strain your urine to collect pieces of the kidney stone for testing. This information is not intended to replace advice given to you  by your health care provider. Make sure you discuss any questions you have with your health care provider. Document Released: 09/10/2000 Document Revised: 08/04/2016 Document Reviewed: 08/04/2016 Elsevier Interactive Patient Education  2017 [employer]  Lithotripsy, Care After This sheet gives you information about how to care for yourself after your procedure. Your health care provider may also give you more specific instructions. If you have problems or questions, contact your health care provider. What can I expect after the procedure? After the procedure, it is common to have:  Some blood in your urine. This should only last for a few days.  Soreness in your back, sides, or upper abdomen for a few days.  Blotches or bruises on your back where the pressure wave entered the skin.  Pain, discomfort, or nausea when pieces (fragments) of the kidney stone move through the tube that carries urine from the kidney to the bladder (ureter). Stone fragments may pass soon after the procedure, but they may continue to pass for up to 4-8 weeks. ? If you have severe pain or nausea, contact your health care provider. This may be caused  by a large stone that was not broken up, and this may mean that you need more treatment.  Some pain or discomfort during urination.  Some pain or discomfort in the lower abdomen or (in men) at the base of the penis.  Follow these instructions at home: Medicines  Take over-the-counter and prescription medicines only as told by your health care provider.  If you were prescribed an antibiotic medicine, take it as told by your health care provider. Do not stop taking the antibiotic even if you start to feel better.  Do not drive for 24 hours if you were given a medicine to help you relax (sedative).  Do not drive or use heavy machinery while taking prescription pain medicine. Eating and drinking  Drink enough water and fluids to keep your urine clear or pale yellow. This helps any remaining pieces of the stone to pass. It can also help prevent new stones from forming.  Eat plenty of fresh fruits and vegetables.  Follow instructions from your health care provider about eating and drinking restrictions. You may be instructed: ? To reduce how much salt (sodium) you eat or drink. Check ingredients and nutrition facts on packaged foods and beverages. ? To reduce how much meat you eat.  Eat the recommended amount of calcium for your age and gender. Ask your health care provider how much calcium you should have. General instructions  Get plenty of rest.  Most people can resume normal activities 1-2 days after the procedure. Ask your health care provider what activities are safe for you.  If directed, strain all urine through the strainer that was provided by your health care provider. ? Keep all fragments for your health care provider to see. Any stones that are found may be sent to a medical lab for examination. The stone may be as small as a grain of salt.  Keep all follow-up visits as told by your health care provider. This is important. Contact a health care provider if:  You have  pain that is severe or does not get better with medicine.  You have nausea that is severe or does not go away.  You have blood in your urine longer than your health care provider told you to expect.  You have more blood in your urine.  You have pain during urination that does not go away.  You urinate more frequently than usual and this does not go away.  You develop a rash or any other possible signs of an allergic reaction. Get help right away if:  You have severe pain in your back, sides, or upper abdomen.  You have severe pain while urinating.  Your urine is very dark red.  You have blood in your stool (feces).  You cannot pass any urine at all.  You feel a strong urge to urinate after emptying your bladder.  You have a fever or chills.  You develop shortness of breath, difficulty breathing, or chest pain.  You have severe nausea that leads to persistent vomiting.  You faint. Summary  After this procedure, it is common to have some pain, discomfort, or nausea when pieces (fragments) of the kidney stone move through the tube that carries urine from the kidney to the bladder (ureter). If this pain or nausea is severe, however, you should contact your health care provider.  Most people can resume normal activities 1-2 days after the procedure. Ask your health care provider what activities are safe for you.  Drink enough water and fluids to keep your urine clear or pale yellow. This helps any remaining pieces of the stone to pass, and it can help prevent new stones from forming.  If directed, strain your urine and keep all fragments for your health care provider to see. Fragments or stones may be as small as a grain of salt.  Get help right away if you have severe pain in your back, sides, or upper abdomen or have severe pain while urinating. This information is not intended to replace advice given to you by your health care provider. Make sure you discuss any questions  you have with your health care provider. Document Released: 10/03/2007 Document Revised: 08/04/2016 Document Reviewed: 08/04/2016 Elsevier Interactive Patient Education  2017 Else[employer] Inc.  Ureteral Stent Implantation, Care After Refer to this sheet in the next few weeks. These instructions provide you with information about caring for yourself after your procedure. Your health care provider may also give you more specific instructions. Your treatment has been planned according to current medical practices, but problems sometimes occur. Call your health care provider if you have any problems or questions after your procedure. What can I expect after the procedure? After the procedure, it is common to have:  Nausea.  Mild pain when you urinate. You may feel this pain in your lower back or lower abdomen. Pain should stop within a few minutes after you urinate. This may last for up to 1 week.  A small amount of blood in your urine for several days.  Follow these instructions at home:  Medicines  Take over-the-counter and prescription medicines only as told by your health care provider.  If you were prescribed an antibiotic medicine, take it as told by your health care provider. Do not stop taking the antibiotic even if you start to feel better.  Do not drive for 24 hours if you received a sedative.  Do not drive or operate heavy machinery while taking prescription pain medicines. Activity  Return to your normal activities as told by your health care provider. Ask your health care provider what activities are safe for you.  Do not lift anything that is heavier than 10 lb (4.5 kg). Follow this limit for 1 week after your procedure, or for as long as told by your health care provider. General instructions  Watch for any blood in your urine.  Call your health care provider if the amount of blood in your urine increases.  If you have a catheter: ? Follow instructions from your health care  provider about taking care of your catheter and collection bag. ? Do not take baths, swim, or use a hot tub until your health care provider approves.  Drink enough fluid to keep your urine clear or pale yellow.  Keep all follow-up visits as told by your health care provider. This is important. Contact a health care provider if:  You have pain that gets worse or does not get better with medicine, especially pain when you urinate.  You have difficulty urinating.  You feel nauseous or you vomit repeatedly during a period of more than 2 days after the procedure. Get help right away if:  Your urine is dark red or has blood clots in it.  You are leaking urine (have incontinence).  The end of the stent comes out of your urethra.  You cannot urinate.  You have sudden, sharp, or severe pain in your abdomen or lower back.  You have a fever. This information is not intended to replace advice given to you by your health care provider. Make sure you discuss any questions you have with your health care provider. Document Released: 05/16/2013 Document Revised: 02/19/2016 Document Reviewed: 03/28/2015 Elsevier Interactive Patient Education  [employer].  Ureteroscopy Ureteroscopy is a procedure to check for and treat problems inside part of the urinary tract. In this procedure, a thin, tube-shaped instrument with a light at the end (ureteroscope) is used to look at the inside of the kidneys and the ureters, which are the tubes that carry urine from the kidneys to the bladder. The ureteroscope is inserted into one or both of the ureters. You may need this procedure if you have frequent urinary tract infections (UTIs), blood in your urine, or a stone in one of your ureters. A ureteroscopy can be done to find the cause of urine blockage in a ureter and to evaluate other abnormalities inside the ureters or kidneys. If stones are found, they can be removed during the procedure. Polyps, abnormal  tissue, and some types of tumors can also be removed or treated. The ureteroscope may also have a tool to remove tissue to be checked for disease under a microscope (biopsy). Tell a health care provider about:  Any allergies you have.  All medicines you are taking, including vitamins, herbs, eye drops, creams, and over-the-counter medicines.  Any problems you or family members have had with anesthetic medicines.  Any blood disorders you have.  Any surgeries you have had.  Any medical conditions you have.  Whether you are pregnant or may be pregnant. What are the risks? Generally, this is a safe procedure. However, problems may occur, including:  Bleeding.  Infection.  Allergic reactions to medicines.  Scarring that narrows the ureter (stricture).  Creating a hole in the ureter (perforation).  What happens before the procedure? Staying hydrated Follow instructions from your health care provider about hydration, which may include:  Up to 2 hours before the procedure - you may continue to drink clear liquids, such as water, clear fruit juice, black coffee, and plain tea.  Eating and drinking restrictions Follow instructions from your health care provider about eating and drinking, which may include:  8 hours before the procedure - stop eating heavy meals or foods such as meat, fried foods, or fatty foods.  6 hours before the procedure - stop eating light  meals or foods, such as toast or cereal.  6 hours before the procedure - stop drinking milk or drinks that contain milk.  2 hours before the procedure - stop drinking clear liquids.  Medicines  Ask your health care provider about: ? Changing or stopping your regular medicines. This is especially important if you are taking diabetes medicines or blood thinners. ? Taking medicines such as aspirin and ibuprofen. These medicines can thin your blood. Do not take these medicines before your procedure if your health care  provider instructs you not to.  You may be given antibiotic medicine to help prevent infection. General instructions  You may have a urine sample taken to check for infection.  Plan to have someone take you home from the hospital or clinic. What happens during the procedure?  To reduce your risk of infection: ? Your health care team will wash or sanitize their hands. ? Your skin will be washed with soap.  An IV tube will be inserted into one of your veins.  You will be given one of the following: ? A medicine to help you relax (sedative). ? A medicine to make you fall asleep (general anesthetic). ? A medicine that is injected into your spine to numb the area below and slightly above the injection site (spinal anesthetic).  To lower your risk of infection, you may be given an antibiotic medicine by an injection or through the IV tube.  The opening from which you urinate (urethra) will be cleaned with a germ-killing solution.  The ureteroscope will be passed through your urethra into your bladder.  A salt-water solution will flow through the ureteroscope to fill your bladder. This will help the health care provider see the openings of your ureters more clearly.  Then, the ureteroscope will be passed into your ureter. ? If a growth is found, a piece of it may be removed so it can be examined under a microscope (biopsy). ? If a stone is found, it may be removed through the ureteroscope, or the stone may be broken up using a laser, shock waves, or electrical energy. ? In some cases, if the ureter is too small, a tube may be inserted that keeps the ureter open (ureteral stent). The stent may be left in place for 1 or 2 weeks to keep the ureter open, and then the ureteroscopy procedure will be performed.  The scope will be removed, and your bladder will be emptied. The procedure may vary among health care providers and hospitals. What happens after the procedure?  Your blood pressure,  heart rate, breathing rate, and blood oxygen level will be monitored until the medicines you were given have worn off.  You may be asked to urinate.  Donot drive for 24 hours if you were given a sedative. This information is not intended to replace advice given to you by your health care provider. Make sure you discuss any questions you have with your health care provider. Document Released: 09/18/2013 Document Revised: 06/29/2016 Document Reviewed: 06/25/2016 Elsevier Interactive Patient Education  2018 [employer].  Ureteroscopy, Care After This sheet gives you information about how to care for yourself after your procedure. Your health care provider may also give you more specific instructions. If you have problems or questions, contact your health care provider. What can I expect after the procedure? After the procedure, it is common to have:  A burning sensation when you urinate.  Blood in your urine.  Mild discomfort in the bladder area  or kidney area when urinating.  Needing to urinate more often or urgently.  Follow these instructions at home: Medicines  Take over-the-counter and prescription medicines only as told by your health care provider.  If you were prescribed an antibiotic medicine, take it as told by your health care provider. Do not stop taking the antibiotic even if you start to feel better. General instructions   Donot drive for 24 hours if you were given a medicine to help you relax (sedative) during your procedure.  To relieve burning, try taking a warm bath or holding a warm washcloth over your groin.  Drink enough fluid to keep your urine clear or pale yellow. ? Drink two 8-ounce glasses of water every hour for the first 2 hours after you get home. ? Continue to drink water often at home.  You can eat what you usually do.  Keep all follow-up visits as told by your health care provider. This is important. ? If you had a tube placed to keep urine  flowing (ureteral stent), ask your health care provider when you need to return to have it removed. Contact a health care provider if:  You have chills or a fever.  You have burning pain for longer than 24 hours after the procedure.  You have blood in your urine for longer than 24 hours after the procedure. Get help right away if:  You have large amounts of blood in your urine.  You have blood clots in your urine.  You have very bad pain.  You have chest pain or trouble breathing.  You are unable to urinate and you have the feeling of a full bladder. This information is not intended to replace advice given to you by your health care provider. Make sure you discuss any questions you have with your health care provider. Document Released: 09/18/2013 Document Revised: 06/29/2016 Document Reviewed: 06/25/2016 Elsevier Interactive Patient Education  2018 [employer].  General Anesthesia, Adult General anesthesia is the use of medicines to make a person "go to sleep" (be unconscious) for a medical procedure. General anesthesia is often recommended when a procedure:  Is long.  Requires you to be Cryder or in an unusual position.  Is major and can cause you to lose blood.  Is impossible to do without general anesthesia.  The medicines used for general anesthesia are called general anesthetics. In addition to making you sleep, the medicines:  Prevent pain.  Control your blood pressure.  Relax your muscles.  Tell a health care provider about:  Any allergies you have.  All medicines you are taking, including vitamins, herbs, eye drops, creams, and over-the-counter medicines.  Any problems you or family members have had with anesthetic medicines.  Types of anesthetics you have had in the past.  Any bleeding disorders you have.  Any surgeries you have had.  Any medical conditions you have.  Any history of heart or lung conditions, such as heart failure, sleep apnea, or  chronic obstructive pulmonary disease (COPD).  Whether you are pregnant or may be pregnant.  Whether you use tobacco, alcohol, marijuana, or street drugs.  Any history of Financial planner.  Any history of depression or anxiety. What are the risks? Generally, this is a safe procedure. However, problems may occur, including:  Allergic reaction to anesthetics.  Lung and heart problems.  Inhaling food or liquids from your stomach into your lungs (aspiration).  Injury to nerves.  Waking up during your procedure and being unable to move (rare).  Extreme agitation or a state of mental confusion (delirium) when you wake up from the anesthetic.  Air in the bloodstream, which can lead to stroke.  These problems are more likely to develop if you are having a major surgery or if you have an advanced medical condition. You can prevent some of these complications by answering all of your health care provider's questions thoroughly and by following all pre-procedure instructions. General anesthesia can cause side effects, including:  Nausea or vomiting  A sore throat from the breathing tube.  Feeling cold or shivery.  Feeling tired, washed out, or achy.  Sleepiness or drowsiness.  Confusion or agitation.  What happens before the procedure? Staying hydrated Follow instructions from your health care provider about hydration, which may include:  Up to 2 hours before the procedure - you may continue to drink clear liquids, such as water, clear fruit juice, black coffee, and plain tea.  Eating and drinking restrictions Follow instructions from your health care provider about eating and drinking, which may include:  8 hours before the procedure - stop eating heavy meals or foods such as meat, fried foods, or fatty foods.  6 hours before the procedure - stop eating light meals or foods, such as toast or cereal.  6 hours before the procedure - stop drinking milk or drinks that contain  milk.  2 hours before the procedure - stop drinking clear liquids.  Medicines  Ask your health care provider about: ? Changing or stopping your regular medicines. This is especially important if you are taking diabetes medicines or blood thinners. ? Taking medicines such as aspirin and ibuprofen. These medicines can thin your blood. Do not take these medicines before your procedure if your health care provider instructs you not to. ? Taking new dietary supplements or medicines. Do not take these during the week before your procedure unless your health care provider approves them.  If you are told to take a medicine or to continue taking a medicine on the day of the procedure, take the medicine with sips of water. General instructions   Ask if you will be going home the same day, the following day, or after a longer hospital stay. ? Plan to have someone take you home. ? Plan to have someone stay with you for the first 24 hours after you leave the hospital or clinic.  For 3-6 weeks before the procedure, try not to use any tobacco products, such as cigarettes, chewing tobacco, and e-cigarettes.  You may brush your teeth on the morning of the procedure, but make sure to spit out the toothpaste. What happens during the procedure?  You will be given anesthetics through a mask and through an IV tube in one of your veins.  You may receive medicine to help you relax (sedative).  As soon as you are asleep, a breathing tube may be used to help you breathe.  An anesthesia specialist will stay with you throughout the procedure. He or she will help keep you comfortable and safe by continuing to give you medicines and adjusting the amount of medicine that you get. He or she will also watch your blood pressure, pulse, and oxygen levels to make sure that the anesthetics do not cause any problems.  If a breathing tube was used to help you breathe, it will be removed before you wake up. The procedure  may vary among health care providers and hospitals. What happens after the procedure?  You will wake up, often slowly,  after the procedure is complete, usually in a recovery area.  Your blood pressure, heart rate, breathing rate, and blood oxygen level will be monitored until the medicines you were given have worn off.  You may be given medicine to help you calm down if you feel anxious or agitated.  If you will be going home the same day, your health care provider may check to make sure you can stand, drink, and urinate.  Your health care providers will treat your pain and side effects before you go home.  Do not drive for 24 hours if you received a sedative.  You may: ? Feel nauseous and vomit. ? Have a sore throat. ? Have mental slowness. ? Feel cold or shivery. ? Feel sleepy. ? Feel tired. ? Feel sore or achy, even in parts of your body where you did not have surgery. This information is not intended to replace advice given to you by your health care provider. Make sure you discuss any questions you have with your health care provider. Document Released: 12/21/2007 Document Revised: 02/24/2016 Document Reviewed: 08/28/2015 Elsevier Interactive Patient Education  2018 [employer]. General Anesthesia, Adult, Care After These instructions provide you with information about caring for yourself after your procedure. Your health care provider may also give you more specific instructions. Your treatment has been planned according to current medical practices, but problems sometimes occur. Call your health care provider if you have any problems or questions after your procedure. What can I expect after the procedure? After the procedure, it is common to have:  Vomiting.  A sore throat.  Mental slowness.  It is common to feel:  Nauseous.  Cold or shivery.  Sleepy.  Tired.  Sore or achy, even in parts of your body where you did not have surgery.  Follow these instructions  at home: For at least 24 hours after the procedure:  Do not: ? Participate in activities where you could fall or become injured. ? Drive. ? Use heavy machinery. ? Drink alcohol. ? Take sleeping pills or medicines that cause drowsiness. ? Make important decisions or sign legal documents. ? Take care of children on your own.  Rest. Eating and drinking  If you vomit, drink water, juice, or soup when you can drink without vomiting.  Drink enough fluid to keep your urine clear or pale yellow.  Make sure you have little or no nausea before eating solid foods.  Follow the diet recommended by your health care provider. General instructions  Have a responsible adult stay with you until you are awake and alert.  Return to your normal activities as told by your health care provider. Ask your health care provider what activities are safe for you.  Take over-the-counter and prescription medicines only as told by your health care provider.  If you smoke, do not smoke without supervision.  Keep all follow-up visits as told by your health care provider. This is important. Contact a health care provider if:  You continue to have nausea or vomiting at home, and medicines are not helpful.  You cannot drink fluids or start eating again.  You cannot urinate after 8-12 hours.  You develop a skin rash.  You have fever.  You have increasing redness at the site of your procedure. Get help right away if:  You have difficulty breathing.  You have chest pain.  You have unexpected bleeding.  You feel that you are having a life-threatening or urgent problem. This information is not intended  to replace advice given to you by your health care provider. Make sure you discuss any questions you have with your health care provider. Document Released: 12/20/2000 Document Revised: 02/16/2016 Document Reviewed: 08/28/2015 Elsevier Interactive Patient Education  [employer].

## 2017-03-21 ENCOUNTER — Ambulatory Visit: Payer: PRIVATE HEALTH INSURANCE | Admitting: Cardiology

## 2017-03-22 ENCOUNTER — Ambulatory Visit (INDEPENDENT_AMBULATORY_CARE_PROVIDER_SITE_OTHER): Payer: PRIVATE HEALTH INSURANCE | Admitting: Cardiology

## 2017-03-22 ENCOUNTER — Encounter (HOSPITAL_COMMUNITY)
Admission: RE | Admit: 2017-03-22 | Discharge: 2017-03-22 | Disposition: A | Discharge status: Home / Self Care | Payer: Medicaid - Out of State | Source: Ambulatory Visit | Attending: Urology | Admitting: Urology

## 2017-03-22 ENCOUNTER — Encounter: Payer: Self-pay | Admitting: Cardiology

## 2017-03-22 ENCOUNTER — Encounter (HOSPITAL_COMMUNITY): Payer: Self-pay

## 2017-03-22 VITALS — BP 124/78 | HR 84 | Ht 70.0 in | Wt 378.0 lb

## 2017-03-22 DIAGNOSIS — Z01812 Encounter for preprocedural laboratory examination: Secondary | ICD-10-CM | POA: Diagnosis present

## 2017-03-22 DIAGNOSIS — I10 Essential (primary) hypertension: Secondary | ICD-10-CM | POA: Diagnosis not present

## 2017-03-22 DIAGNOSIS — I2510 Atherosclerotic heart disease of native coronary artery without angina pectoris: Secondary | ICD-10-CM | POA: Diagnosis not present

## 2017-03-22 DIAGNOSIS — E782 Mixed hyperlipidemia: Secondary | ICD-10-CM

## 2017-03-22 DIAGNOSIS — N201 Calculus of ureter: Secondary | ICD-10-CM | POA: Diagnosis not present

## 2017-03-22 DIAGNOSIS — I5032 Chronic diastolic (congestive) heart failure: Secondary | ICD-10-CM

## 2017-03-22 HISTORY — DX: Chronic kidney disease, unspecified: N18.9

## 2017-03-22 HISTORY — DX: Acute myocardial infarction, unspecified: I21.9

## 2017-03-22 LAB — CBC WITH DIFFERENTIAL/PLATELET
Basophils Absolute: 0 10*3/uL (ref 0.0–0.1)
Basophils Relative: 0 %
Eosinophils Absolute: 0.1 10*3/uL (ref 0.0–0.7)
Eosinophils Relative: 1 %
HCT: 36.6 % (ref 36.0–46.0)
Hemoglobin: 12.2 g/dL (ref 12.0–15.0)
Lymphocytes Relative: 19 %
Lymphs Abs: 1.9 10*3/uL (ref 0.7–4.0)
MCH: 29.3 pg (ref 26.0–34.0)
MCHC: 33.3 g/dL (ref 30.0–36.0)
MCV: 87.8 fL (ref 78.0–100.0)
Monocytes Absolute: 0.5 10*3/uL (ref 0.1–1.0)
Monocytes Relative: 5 %
Neutro Abs: 7.4 10*3/uL (ref 1.7–7.7)
Neutrophils Relative %: 75 %
Platelets: 373 10*3/uL (ref 150–400)
RBC: 4.17 MIL/uL (ref 3.87–5.11)
RDW: 13.6 % (ref 11.5–15.5)
WBC: 9.9 10*3/uL (ref 4.0–10.5)

## 2017-03-22 LAB — BASIC METABOLIC PANEL
Anion gap: 10 (ref 5–15)
BUN: 9 mg/dL (ref 6–20)
CO2: 21 mmol/L — ABNORMAL LOW (ref 22–32)
Calcium: 8.9 mg/dL (ref 8.9–10.3)
Chloride: 102 mmol/L (ref 101–111)
Creatinine, Ser: 0.74 mg/dL (ref 0.44–1.00)
GFR calc Af Amer: >60 mL/min (ref >60)
GFR calc non Af Amer: >60 mL/min (ref >60)
Glucose, Bld: 294 mg/dL — ABNORMAL HIGH (ref 65–99)
Potassium: 3.7 mmol/L (ref 3.5–5.1)
Sodium: 133 mmol/L — ABNORMAL LOW (ref 135–145)

## 2017-03-22 LAB — HCG, SERUM, QUALITATIVE: Preg, Serum: NEGATIVE

## 2017-03-22 NOTE — Patient Instructions (Signed)
Your physician recommends that you schedule a follow-up appointment in: 3 months with Dr.Branch     Your physician recommends that you continue on your current medications as directed. Please refer to the Current Medication list given to you today.    If you need a refill on your cardiac medications before your next appointment, please call your pharmacy.     No lab work or tests ordered today.       Thank you for choosing [facility] !           

## 2017-03-22 NOTE — Progress Notes (Signed)
Clinical Summary [name] is a 36 y.o.female seen today for follow up of the following medical problems  1. CAD - prior anterior MI 09/2011, 95% lesion in mid LAD and ramus with 95% ostial stenosis. LVEF 55% by LV gram, apical hypokinesis. BMS to mid LAD and cutting balloon angioplasty of ramus.  - repeat cath 02/2012 with patent vessels and stent - 09/2011 Echo: LVEF 50% - 12/2013 Lexiscan large anteroseptal ischemia - cath 12/2013 with patent vessels - cath 02/2016 [facility] in setting of NSTEMIshowed LM patent, LAD patent with patent stent, distal LAD 30-40%, LCX patent, RCA 30-40%. LVEDP 40, PCWP 30 mean PA 32, CI 4.93 - echo 02/2016 [place] LVEF 50%, apical akinesis -started on torsemide 40mg  daily. Started on plavix 75mg  daily at discharge.    - no recent chest pain. Chronic SOB, usually in warmer weather.  - compliant with meds  2. Chronic diastolic HF  - occasioanal edema at times, overall controlled with diuretics.   3. HTN - she is compliant with meds  4. Hyperlipidemia - compliant with statin - she reports upcoming labs with pcp  5. Kidney stones - upcoming surgery  Past Medical History:  Diagnosis Date  . Anxiety   . Chronic kidney disease    history of kidney stones  . Coronary artery disease 09/2011   a. Anterior MI. PCI and BMS to LAD and angioplsty to ostial Ramus;  b. 02/2012 Cath patent stent and ptca site, med rx.  . Diabetes mellitus   . Hyperlipidemia   . Hypertension   . Migraines   . Morbid obesity (HCC)   . Myocardial infarct (HCC) 09/2011  . Myocardial infarction (HCC) 02/2016     Allergies  Allergen Reactions  . Bee Venom Anaphylaxis and Swelling  . Sulfa Drugs Cross Reactors Nausea And Vomiting     Current Outpatient Prescriptions  Medication Sig Dispense Refill  . acetaminophen (TYLENOL) 500 MG tablet Take 1,000 mg by mouth every 6 (six) hours as needed for fever (had fever over the weekend and took regularly).    Marland Kitchen aspirin  81 MG tablet Take 81 mg by mouth daily.    Marland Kitchen atorvastatin (LIPITOR) 80 MG tablet Take 80 mg by mouth daily.    . carvedilol (COREG) 25 MG tablet TAKE 1 TABLET BY MOUTH TWICE DAILY WITH A MEAL 60 tablet 6  . clopidogrel (PLAVIX) 75 MG tablet TAKE 1 TABLET BY MOUTH DAILY 30 tablet 0  . ibuprofen (ADVIL,MOTRIN) 200 MG tablet Take 400 mg by mouth every 8 (eight) hours as needed for headache.    . losartan (COZAAR) 25 MG tablet Take 12.5 mg by mouth daily.  0  . medroxyPROGESTERone (PROVERA) 5 MG tablet Take 5 mg by mouth daily.    . metFORMIN (GLUCOPHAGE) 1000 MG tablet Take 1,000 mg by mouth 2 (two) times daily.   5  . nitroGLYCERIN (NITROSTAT) 0.4 MG SL tablet Place 0.4 mg under the tongue every 5 (five) minutes as needed for chest pain.    Marland Kitchen ondansetron (ZOFRAN) 4 MG tablet Take 1 tablet (4 mg total) by mouth every 6 (six) hours as needed for nausea. (Patient not taking: Reported on 03/17/2017) 20 tablet 0  . potassium chloride SA (K-DUR,KLOR-CON) 20 MEQ tablet Take 20 mEq by mouth daily.     . pregabalin (LYRICA) 75 MG capsule Take 75 mg by mouth 2 (two) times daily.    Marland Kitchen torsemide (DEMADEX) 20 MG tablet TAKE 2 TABLETS BY MOUTH DAILY 60  tablet 6  . traMADol (ULTRAM) 50 MG tablet Take 1 tablet (50 mg total) by mouth every 6 (six) hours as needed. (Patient not taking: Reported on 03/17/2017) 20 tablet 0   No current facility-administered medications for this visit.      Past Surgical History:  Procedure Laterality Date  . CARDIAC CATHETERIZATION  10/08/2011   LAD: 95% mid, Ramus: 95% ostial  . CESAREAN SECTION    . CHOLECYSTECTOMY    . CORONARY ANGIOPLASTY WITH STENT PLACEMENT  10/08/2011   LAD: BMS, Ramus: cutting balloon angioplasty  . CYSTOSCOPY WITH RETROGRADE PYELOGRAM, URETEROSCOPY AND STENT PLACEMENT Left 02/16/2017   Procedure: CYSTOSCOPY WITH RETROGRADE PYELOGRAM, URETEROSCOPY AND STENT PLACEMENT;  Surgeon: Ihor Gully, MD;  Location: [facility];  Service: Urology;  Laterality: Left;  .  LEFT HEART CATHETERIZATION WITH CORONARY ANGIOGRAM N/A 10/08/2011   Procedure: LEFT HEART CATHETERIZATION WITH CORONARY ANGIOGRAM;  Surgeon: Peter M Swaziland, MD;  Location: [facility] CATH LAB;  Service: Cardiovascular;  Laterality: N/A;  . LEFT HEART CATHETERIZATION WITH CORONARY ANGIOGRAM N/A 03/02/2012   Procedure: LEFT HEART CATHETERIZATION WITH CORONARY ANGIOGRAM;  Surgeon: Kathleene Hazel, MD;  Location: [facility] CATH LAB;  Service: Cardiovascular;  Laterality: N/A;  . LEFT HEART CATHETERIZATION WITH CORONARY ANGIOGRAM N/A 01/04/2014   Procedure: LEFT HEART CATHETERIZATION WITH CORONARY ANGIOGRAM;  Surgeon: Lennette Bihari, MD;  Location: [facility] CATH LAB;  Service: Cardiovascular;  Laterality: N/A;     Allergies  Allergen Reactions  . Bee Venom Anaphylaxis and Swelling  . Sulfa Drugs Cross Reactors Nausea And Vomiting      Family History  Problem Relation Age of Onset  . Heart attack Father      Social History [name] reports that she has been smoking Cigarettes.  She started smoking about 27 years ago. She has a 10.00 pack-year smoking history. She has never used smokeless tobacco. [name] reports that she does not drink alcohol.   Review of Systems CONSTITUTIONAL: No weight loss, fever, chills, weakness or fatigue.  HEENT: Eyes: No visual loss, blurred vision, double vision or yellow sclerae.No hearing loss, sneezing, congestion, runny nose or sore throat.  SKIN: No rash or itching.  CARDIOVASCULAR: per hpi RESPIRATORY: No shortness of breath, cough or sputum.  GASTROINTESTINAL: No anorexia, nausea, vomiting or diarrhea. No abdominal pain or blood.  GENITOURINARY: No burning on urination, no polyuria NEUROLOGICAL: No headache, dizziness, syncope, paralysis, ataxia, numbness or tingling in the extremities. No change in bowel or bladder control.  MUSCULOSKELETAL: No muscle, back pain, joint pain or stiffness.  LYMPHATICS: No enlarged nodes. No history of splenectomy.  PSYCHIATRIC: No  history of depression or anxiety.  ENDOCRINOLOGIC: No reports of sweating, cold or heat intolerance. No polyuria or polydipsia.  Marland Kitchen   Physical Examination Vitals:   03/22/17 1336  BP: 124/78  Pulse: 84   Vitals:   03/22/17 1336  Weight: (!) 378 lb (171.5 kg)  Height: 5\' 10"  (1.778 m)    Gen: resting comfortably, no acute distress HEENT: no scleral icterus, pupils equal round and reactive, no palptable cervical adenopathy,  CV: RRR, no m/r/g, no jvd Resp: Clear to auscultation bilaterally GI: abdomen is soft, non-tender, non-distended, normal bowel sounds, no hepatosplenomegaly MSK: extremities are warm, no edema.  Skin: warm, no rash Neuro:  no focal deficits Psych: appropriate affect   Diagnostic Studies 02/2012 Cath Hemodynamic Findings: Central aortic pressure: 108/70  Left ventricular pressure: 123/10/21  Angiographic Findings: Left main: No obstructive disease noted.  Left Anterior Descending Artery: Large  caliber vessel that courses to the apex. There is a stent present in the mid vessel that is widely patent with no restenosis. The remainder of the LAD is disease free. There is a moderate sized diagonal Galilee Pierron with 30% proximal stenosis.  Circumflex Artery: Dominant, large caliber vessel with no disease throughout the AV groove segment. There is small to moderate sized intermediate Nadege Carriger that has ostial 30% stenosis. The remainder of the Circumflex has no obstructive disease.  Right Coronary Artery: Small, non-dominant vessel. No disease noted.  Left Ventricular Angiogram: LVEF 50-55%.  Impression:  1. Double vessel CAD with patent stent mid LAD and patent angioplasty site at ostium of intermediate Rache Klimaszewski.  2. Normal LV systolic function   09/2011 Cath PROCEDURAL FINDINGS  Hemodynamics:  AO 135/92 with a mean of 114 mmHg  LV 136/25 mmHg  Coronary angiography:  Coronary dominance: Left  Left mainstem: Normal.  Left anterior descending  (LAD): There is a 95% stenosis in the mid LAD immediately after the takeoff of the first diagonal. The first diagonal Caleyah Jr is moderate in size and appears normal. The LAD stenosis does appear to be hazy.  There is a moderately large ramus intermediate Mystique Bjelland which has a 95% ostial stenosis.  Left circumflex (LCx): This is a dominant vessel and appears normal throughout.  Right coronary artery (RCA): This is a small nondominant vessel and is normal.  Left ventriculography: Left ventricular systolic function is abnormal with apical hypokinesis. LVEF is estimated at 55%, there is no significant mitral regurgitation  PCI Note: Following the diagnostic procedure, the decision was made to proceed with PCI of the LAD. Weight-based bivalirudin was given for anticoagulation. Once a therapeutic ACT was achieved, a 5 [place] EBU guide catheter was inserted. A pro-water coronary guidewire was used to cross the lesion. The lesion was predilated with a 2.5 mm balloon. The lesion was then stented with a 3.0 x 18 mm vision stent. The stent was postdilated with a 3.25 mm noncompliant balloon. Following PCI, there was 0% residual stenosis and TIMI-3 flow. At this point the patient's pain was improved but was [place] of moderate intensity. We then proceeded to intervene on the ostial ramus intermediate lesion. This was crossed with the prowater wire. This was dilated using a 3.0 x 10 mm cutting balloon performing 3 inflations to 6 atmospheres. This showed a good angiographic result with less than 30% residual stenosis and TIMI grade 3 flow. Final angiography confirmed an excellent result. The patient tolerated the procedure well. There were no immediate procedural complications. A TR band was used for radial hemostasis. The patient was transferred to the post catheterization recovery area for further monitoring.  PCI Data:  Vessel - LAD/Segment - mid vessel immediately after the takeoff of the first diagonal.  Percent  Stenosis (pre) 95%  TIMI-flow 3.  Stent 3.0 x 18 mm vision stent.  Percent Stenosis (post) 0%  TIMI-flow (post) 3.  Second vessel: Ostial ramus intermediate Analeise Mccleery  Percent stenosis: 95%.  TIMI flow 3.  3.0 mm cutting balloon  Percent stenosis (post) less than 30%  TIMI flow 3  Final Conclusions:  1. Severe 2 vessel obstructive coronary disease.  2. Overall well preserved LV systolic function with apical wall motion abnormality and ejection fraction of 55%.  3. Successful intracoronary stenting of the mid LAD with a bare-metal stent.  4. Successful cutting balloon angioplasty of the ramus intermediate Eliyohu Class.  Recommendations:  Continue therapy with Plavix. Patient reports a history of allergy to aspirin. Aggressive  risk factor modification.  Disposition: Patient be observed in the intensive care unit tonight. If she has no complications she should be able to be transferred to telemetry in the morning.   12/2013 Cath HEMODYNAMICS:  Central Aorta: 100/60  Left Ventricle: 100/3  ANGIOGRAPHY:  1. Left main: Normal and trifurcated into an LAD, ramus intermediate vessel, and a dominant left circumflex coronary artery  2. LAD: Barbera Setters gave rise to a proximal large first diagonal vessel. The LAD just after the diagonal vessel had a widely patent stent. The remainder of the LAD was free of significant disease and extended to the LV apex. 3. Ramus Intermediate: No evidence for restenosis at previous site of ostial cutting balloon intervention. 4. Left circumflex: Angiographically normal. Dominant vessel, which gave rise to 2 marginal branches and in the PDA.Marland Kitchen  4. Right coronary artery: Angiographically normal, nondominant vessel  Left ventriculography revealed global LV function. There was a suggestion of mild residual distal anterolateral hypocontractility. There was catheter-induced mitral regurgitation.  IMPRESSION:  Normal LV function with mild  distal anterolateral, residual hypocontractility  No significant residual coronary obstructive disease with evidence for widely patent LAD stent, no evidence for restenosis of the ramus intermediate vessel, normal dominant left circumflex coronary artery and normal nondominant right coronary artery.    12/2013 MPI IMPRESSION: 1. Abnormal Lexiscan for ischemia  2. Large anteroseptal and apical area of ischemia  3. High risk study for major cardiac events based on area of myocardium at [facility]  4. Normal left ventricular systolic function, left ventricular ejection fraction 52%      Assessment and Plan  1. CAD - prior NSTEMI with admission to [facility], cath without obstructive CAD. Elevated LVEDP perhaps played some role - we will d/c plavix, she has completed 1 year for medically treated NSTEMI - continue current meds  2. Chronic diastolic HF - contolled, continue current meds   3. HTN -her bp is at goal, continue current meds  4. Hyperlipidemia -  Continue statin.       Antoine Poche, M.D.

## 2017-03-25 ENCOUNTER — Telehealth: Payer: Self-pay | Admitting: *Deleted

## 2017-03-25 NOTE — Telephone Encounter (Signed)
-----   Message from Antoine Poche, MD sent at 03/25/2017  7:30 AM EDT ----- Please let patient know that after looking things over further after our visit this week that I actually have her stop her plavix, I do not see much benefit to continuing, and we do not need to conintue plavix for an additional amount of time as we had initially talked about at our appt.   Katelyn Ferry MD

## 2017-03-25 NOTE — Telephone Encounter (Signed)
Husband Ree Kida Hulbert notified and verified understanding.

## 2017-03-28 ENCOUNTER — Encounter: Payer: Self-pay | Admitting: Cardiology

## 2017-03-28 ENCOUNTER — Encounter (HOSPITAL_COMMUNITY): Payer: Self-pay | Admitting: *Deleted

## 2017-03-28 ENCOUNTER — Ambulatory Visit (HOSPITAL_COMMUNITY): Payer: Medicaid - Out of State | Admitting: Anesthesiology

## 2017-03-28 ENCOUNTER — Ambulatory Visit (HOSPITAL_COMMUNITY): Payer: Medicaid - Out of State

## 2017-03-28 ENCOUNTER — Encounter (HOSPITAL_COMMUNITY)
Admission: RE | Disposition: A | Discharge status: Home / Self Care | Payer: Self-pay | Source: Ambulatory Visit | Attending: Urology

## 2017-03-28 ENCOUNTER — Ambulatory Visit (HOSPITAL_COMMUNITY)
Admission: RE | Admit: 2017-03-28 | Discharge: 2017-03-28 | Disposition: A | Discharge status: Home / Self Care | Payer: Medicaid - Out of State | Source: Ambulatory Visit | Attending: Urology | Admitting: Urology

## 2017-03-28 DIAGNOSIS — N132 Hydronephrosis with renal and ureteral calculous obstruction: Secondary | ICD-10-CM | POA: Diagnosis not present

## 2017-03-28 DIAGNOSIS — Z793 Long term (current) use of hormonal contraceptives: Secondary | ICD-10-CM | POA: Diagnosis not present

## 2017-03-28 DIAGNOSIS — Z882 Allergy status to sulfonamides status: Secondary | ICD-10-CM | POA: Insufficient documentation

## 2017-03-28 DIAGNOSIS — Z79899 Other long term (current) drug therapy: Secondary | ICD-10-CM | POA: Diagnosis not present

## 2017-03-28 DIAGNOSIS — Z7982 Long term (current) use of aspirin: Secondary | ICD-10-CM | POA: Diagnosis not present

## 2017-03-28 DIAGNOSIS — I252 Old myocardial infarction: Secondary | ICD-10-CM | POA: Diagnosis not present

## 2017-03-28 DIAGNOSIS — N201 Calculus of ureter: Secondary | ICD-10-CM | POA: Diagnosis not present

## 2017-03-28 DIAGNOSIS — Z87442 Personal history of urinary calculi: Secondary | ICD-10-CM | POA: Diagnosis not present

## 2017-03-28 DIAGNOSIS — Z9889 Other specified postprocedural states: Secondary | ICD-10-CM | POA: Diagnosis not present

## 2017-03-28 DIAGNOSIS — I2510 Atherosclerotic heart disease of native coronary artery without angina pectoris: Secondary | ICD-10-CM | POA: Insufficient documentation

## 2017-03-28 DIAGNOSIS — Z91030 Bee allergy status: Secondary | ICD-10-CM | POA: Diagnosis not present

## 2017-03-28 DIAGNOSIS — E785 Hyperlipidemia, unspecified: Secondary | ICD-10-CM | POA: Insufficient documentation

## 2017-03-28 DIAGNOSIS — N210 Calculus in bladder: Secondary | ICD-10-CM

## 2017-03-28 DIAGNOSIS — F17210 Nicotine dependence, cigarettes, uncomplicated: Secondary | ICD-10-CM | POA: Diagnosis not present

## 2017-03-28 DIAGNOSIS — I129 Hypertensive chronic kidney disease with stage 1 through stage 4 chronic kidney disease, or unspecified chronic kidney disease: Secondary | ICD-10-CM | POA: Insufficient documentation

## 2017-03-28 DIAGNOSIS — Z8249 Family history of ischemic heart disease and other diseases of the circulatory system: Secondary | ICD-10-CM | POA: Insufficient documentation

## 2017-03-28 DIAGNOSIS — Z955 Presence of coronary angioplasty implant and graft: Secondary | ICD-10-CM | POA: Diagnosis not present

## 2017-03-28 DIAGNOSIS — Z7984 Long term (current) use of oral hypoglycemic drugs: Secondary | ICD-10-CM | POA: Insufficient documentation

## 2017-03-28 DIAGNOSIS — Z87892 Personal history of anaphylaxis: Secondary | ICD-10-CM | POA: Diagnosis not present

## 2017-03-28 DIAGNOSIS — E1122 Type 2 diabetes mellitus with diabetic chronic kidney disease: Secondary | ICD-10-CM | POA: Insufficient documentation

## 2017-03-28 DIAGNOSIS — Z7901 Long term (current) use of anticoagulants: Secondary | ICD-10-CM | POA: Insufficient documentation

## 2017-03-28 DIAGNOSIS — N189 Chronic kidney disease, unspecified: Secondary | ICD-10-CM | POA: Diagnosis not present

## 2017-03-28 DIAGNOSIS — Z9049 Acquired absence of other specified parts of digestive tract: Secondary | ICD-10-CM | POA: Insufficient documentation

## 2017-03-28 HISTORY — PX: CYSTOSCOPY WITH RETROGRADE PYELOGRAM, URETEROSCOPY AND STENT PLACEMENT: SHX5789

## 2017-03-28 LAB — GLUCOSE, CAPILLARY
Glucose-Capillary: 131 mg/dL — ABNORMAL HIGH (ref 65–99)
Glucose-Capillary: 117 mg/dL — ABNORMAL HIGH (ref 65–99)

## 2017-03-28 SURGERY — CYSTOURETEROSCOPY, WITH RETROGRADE PYELOGRAM AND STENT INSERTION
Anesthesia: General | Laterality: Left

## 2017-03-28 MED ORDER — ONDANSETRON HCL 4 MG/2ML IJ SOLN
INTRAMUSCULAR | Status: AC
  Filled 2017-03-28: qty 2

## 2017-03-28 MED ORDER — OXYCODONE-ACETAMINOPHEN 5-325 MG PO TABS: 1.0000 | ORAL_TABLET | ORAL | 0 refills | Status: DC | PRN

## 2017-03-28 MED ORDER — LACTATED RINGERS IV SOLN
INTRAVENOUS | Status: DC
  Administered 2017-03-28 (×2): via INTRAVENOUS

## 2017-03-28 MED ORDER — MIDAZOLAM HCL 2 MG/2ML IJ SOLN
1.0000 mg | INTRAMUSCULAR | Status: AC
  Administered 2017-03-28 (×2): 2 mg via INTRAVENOUS
  Filled 2017-03-28: qty 2

## 2017-03-28 MED ORDER — GLYCOPYRROLATE 0.2 MG/ML IJ SOLN
0.2000 mg | Freq: Once | INTRAMUSCULAR | Status: AC
  Administered 2017-03-28: 0.2 mg via INTRAVENOUS

## 2017-03-28 MED ORDER — GLYCOPYRROLATE 0.2 MG/ML IJ SOLN
INTRAMUSCULAR | Status: AC
  Filled 2017-03-28: qty 2

## 2017-03-28 MED ORDER — LIDOCAINE HCL (CARDIAC) 20 MG/ML IV SOLN
INTRAVENOUS | Status: DC | PRN
  Administered 2017-03-28: 50 mg via INTRATRACHEAL

## 2017-03-28 MED ORDER — SODIUM CHLORIDE 0.9 % IR SOLN
Status: DC | PRN
  Administered 2017-03-28 (×2): 1000 mL via INTRAVESICAL

## 2017-03-28 MED ORDER — FENTANYL CITRATE (PF) 100 MCG/2ML IJ SOLN: 25.0000 ug | INTRAMUSCULAR | Status: DC | PRN

## 2017-03-28 MED ORDER — ROCURONIUM BROMIDE 50 MG/5ML IV SOLN
INTRAVENOUS | Status: AC
  Filled 2017-03-28: qty 1

## 2017-03-28 MED ORDER — DIATRIZOATE MEGLUMINE 30 % UR SOLN
URETHRAL | Status: DC | PRN
  Administered 2017-03-28: 10 mL via URETHRAL

## 2017-03-28 MED ORDER — FENTANYL CITRATE (PF) 100 MCG/2ML IJ SOLN
INTRAMUSCULAR | Status: DC | PRN
  Administered 2017-03-28: 50 ug via INTRAVENOUS
  Administered 2017-03-28: 100 ug via INTRAVENOUS

## 2017-03-28 MED ORDER — SUCCINYLCHOLINE CHLORIDE 20 MG/ML IJ SOLN
INTRAMUSCULAR | Status: AC
  Filled 2017-03-28: qty 1

## 2017-03-28 MED ORDER — FENTANYL CITRATE (PF) 100 MCG/2ML IJ SOLN
INTRAMUSCULAR | Status: AC
  Filled 2017-03-28: qty 2

## 2017-03-28 MED ORDER — PROPOFOL 10 MG/ML IV BOLUS
INTRAVENOUS | Status: AC
  Filled 2017-03-28: qty 20

## 2017-03-28 MED ORDER — ALBUTEROL SULFATE HFA 108 (90 BASE) MCG/ACT IN AERS
INHALATION_SPRAY | RESPIRATORY_TRACT | Status: DC | PRN
  Administered 2017-03-28: 3 via RESPIRATORY_TRACT

## 2017-03-28 MED ORDER — GLYCOPYRROLATE 0.2 MG/ML IJ SOLN
INTRAMUSCULAR | Status: AC
  Filled 2017-03-28: qty 1

## 2017-03-28 MED ORDER — PROPOFOL 10 MG/ML IV BOLUS
INTRAVENOUS | Status: DC | PRN
  Administered 2017-03-28: 170 mg via INTRAVENOUS

## 2017-03-28 MED ORDER — ONDANSETRON HCL 4 MG/2ML IJ SOLN
4.0000 mg | Freq: Once | INTRAMUSCULAR | Status: AC
  Administered 2017-03-28: 4 mg via INTRAVENOUS

## 2017-03-28 MED ORDER — GLYCOPYRROLATE 0.2 MG/ML IJ SOLN
INTRAMUSCULAR | Status: DC | PRN
  Administered 2017-03-28: 0.4 mg via INTRAVENOUS

## 2017-03-28 MED ORDER — LIDOCAINE HCL (PF) 1 % IJ SOLN
INTRAMUSCULAR | Status: AC
  Filled 2017-03-28: qty 5

## 2017-03-28 MED ORDER — MIDAZOLAM HCL 2 MG/2ML IJ SOLN
INTRAMUSCULAR | Status: AC
  Filled 2017-03-28: qty 2

## 2017-03-28 MED ORDER — DIATRIZOATE MEGLUMINE 30 % UR SOLN
URETHRAL | Status: AC
  Filled 2017-03-28: qty 300

## 2017-03-28 MED ORDER — NEOSTIGMINE METHYLSULFATE 10 MG/10ML IV SOLN
INTRAVENOUS | Status: DC | PRN
  Administered 2017-03-28: 3 mg via INTRAVENOUS

## 2017-03-28 MED ORDER — ONDANSETRON HCL 4 MG/2ML IJ SOLN
INTRAMUSCULAR | Status: DC | PRN
  Administered 2017-03-28: 4 mg via INTRAVENOUS

## 2017-03-28 MED ORDER — SUCCINYLCHOLINE CHLORIDE 20 MG/ML IJ SOLN
INTRAMUSCULAR | Status: DC | PRN
  Administered 2017-03-28: 120 mg via INTRAVENOUS

## 2017-03-28 MED ORDER — CIPROFLOXACIN IN D5W 400 MG/200ML IV SOLN
400.0000 mg | INTRAVENOUS | Status: AC
  Administered 2017-03-28: 400 mg via INTRAVENOUS
  Filled 2017-03-28: qty 200

## 2017-03-28 MED ORDER — NEOSTIGMINE METHYLSULFATE 10 MG/10ML IV SOLN
INTRAVENOUS | Status: AC
  Filled 2017-03-28: qty 1

## 2017-03-28 SURGICAL SUPPLY — 26 items
BAG DRAIN CYSTO-URO STER (DRAIN) ×3 IMPLANT
BAG HAMPER (MISCELLANEOUS) ×3 IMPLANT
CATH INTERMIT  6FR 70CM (CATHETERS) IMPLANT
CLOTH BEACON ORANGE TIMEOUT ST (SAFETY) ×3 IMPLANT
DECANTER SPIKE VIAL GLASS SM (MISCELLANEOUS) ×3 IMPLANT
EXTRACTOR STONE NITINOL NGAGE (UROLOGICAL SUPPLIES) ×3 IMPLANT
FIBER LASER FLEXIVA 200 (UROLOGICAL SUPPLIES) ×3 IMPLANT
GLOVE BIO SURGEON STRL SZ8 (GLOVE) ×3 IMPLANT
GLOVE BIOGEL PI IND STRL 7.0 (GLOVE) ×1 IMPLANT
GLOVE BIOGEL PI INDICATOR 7.0 (GLOVE) ×4
GOWN STRL REUS W/ TWL XL LVL3 (GOWN DISPOSABLE) ×1 IMPLANT
GOWN STRL REUS W/TWL LRG LVL3 (GOWN DISPOSABLE) ×3 IMPLANT
GOWN STRL REUS W/TWL XL LVL3 (GOWN DISPOSABLE) ×3
GUIDEWIRE STR DUAL SENSOR (WIRE) IMPLANT
GUIDEWIRE STR ZIPWIRE 035X150 (MISCELLANEOUS) ×3 IMPLANT
IV NS IRRIG 3000ML ARTHROMATIC (IV SOLUTION) ×6 IMPLANT
KIT ROOM TURNOVER AP CYSTO (KITS) ×3 IMPLANT
MANIFOLD NEPTUNE II (INSTRUMENTS) IMPLANT
NS IRRIG 1000ML POUR BTL (IV SOLUTION) ×6 IMPLANT
PACK CYSTO (CUSTOM PROCEDURE TRAY) ×3 IMPLANT
PAD ARMBOARD 7.5X6 YLW CONV (MISCELLANEOUS) ×3 IMPLANT
STENT URET 6FRX24 CONTOUR (STENTS) ×2 IMPLANT
STENT URET 6FRX26 CONTOUR (STENTS) IMPLANT
SYRINGE 10CC LL (SYRINGE) ×3 IMPLANT
TOWEL OR 17X26 4PK STRL BLUE (TOWEL DISPOSABLE) ×3 IMPLANT
WATER STERILE IRR 1000ML POUR (IV SOLUTION) ×3 IMPLANT

## 2017-03-28 NOTE — Transfer of Care (Signed)
Immediate Anesthesia Transfer of Care Note  Patient: [name]  Procedure(s) Performed: Procedure(s): LEFT STENT REMOVAL LEFT RETROGRADE PYELOGRAM LEFT URETEROSCOPY   LASER LITHOTRIPSY  AND  STENT REPLACEMENT (Left)  Patient Location: PACU  Anesthesia Type:General  Level of Consciousness: awake, alert , oriented and patient cooperative  Airway & Oxygen Therapy: Patient Spontanous Breathing and Patient connected to nasal cannula oxygen  Post-op Assessment: Report given to RN and Post -op Vital signs reviewed and stable  Post vital signs: Reviewed and stable  Last Vitals:  Vitals:   03/28/17 0725 03/28/17 0856  BP: 107/61 111/68  Pulse:  84  Resp: (!) 28 (!) 22  Temp:  37.1 C    Last Pain:  Vitals:   03/28/17 0856  TempSrc: Oral      Patients Stated Pain Goal: 10 (03/28/17 0646)  Complications: No apparent anesthesia complications

## 2017-03-28 NOTE — H&P (Signed)
Urology Admission H&P  Chief Complaint: left flank pain  History of Present Illness: [name] is a 36yo with a hx of L ureteral calculus who is s/p L stent placement for sepsis.  Past Medical History:  Diagnosis Date  . Anxiety   . Chronic kidney disease    history of kidney stones  . Coronary artery disease 09/2011   a. Anterior MI. PCI and BMS to LAD and angioplsty to ostial Ramus;  b. 02/2012 Cath patent stent and ptca site, med rx.  . Diabetes mellitus   . Hyperlipidemia   . Hypertension   . Migraines   . Morbid obesity (HCC)   . Myocardial infarct (HCC) 09/2011  . Myocardial infarction [facility]) 02/2016   Past Surgical History:  Procedure Laterality Date  . CARDIAC CATHETERIZATION  10/08/2011   LAD: 95% mid, Ramus: 95% ostial  . CESAREAN SECTION    . CHOLECYSTECTOMY    . CORONARY ANGIOPLASTY WITH STENT PLACEMENT  10/08/2011   LAD: BMS, Ramus: cutting balloon angioplasty  . CYSTOSCOPY WITH RETROGRADE PYELOGRAM, URETEROSCOPY AND STENT PLACEMENT Left 02/16/2017   Procedure: CYSTOSCOPY WITH RETROGRADE PYELOGRAM, URETEROSCOPY AND STENT PLACEMENT;  Surgeon: Ihor Gully, MD;  Location: [facility] ORS;  Service: Urology;  Laterality: Left;  . LEFT HEART CATHETERIZATION WITH CORONARY ANGIOGRAM N/A 10/08/2011   Procedure: LEFT HEART CATHETERIZATION WITH CORONARY ANGIOGRAM;  Surgeon: Peter M Swaziland, MD;  Location: [facility] CATH LAB;  Service: Cardiovascular;  Laterality: N/A;  . LEFT HEART CATHETERIZATION WITH CORONARY ANGIOGRAM N/A 03/02/2012   Procedure: LEFT HEART CATHETERIZATION WITH CORONARY ANGIOGRAM;  Surgeon: Kathleene Hazel, MD;  Location: [facility] CATH LAB;  Service: Cardiovascular;  Laterality: N/A;  . LEFT HEART CATHETERIZATION WITH CORONARY ANGIOGRAM N/A 01/04/2014   Procedure: LEFT HEART CATHETERIZATION WITH CORONARY ANGIOGRAM;  Surgeon: Lennette Bihari, MD;  Location: [facility] CATH LAB;  Service: Cardiovascular;  Laterality: N/A;    Home Medications:  Prescriptions Prior to Admission  Medication  Sig Dispense Refill Last Dose  . acetaminophen (TYLENOL) 500 MG tablet Take 1,000 mg by mouth every 6 (six) hours as needed for fever (had fever over the weekend and took regularly).   03/27/2017 at Unknown time  . atorvastatin (LIPITOR) 80 MG tablet Take 80 mg by mouth daily.   03/27/2017 at Unknown time  . carvedilol (COREG) 25 MG tablet TAKE 1 TABLET BY MOUTH TWICE DAILY WITH A MEAL 60 tablet 6 03/27/2017 at Unknown time  . clopidogrel (PLAVIX) 75 MG tablet TAKE 1 TABLET BY MOUTH DAILY 30 tablet 0 03/19/2017  . ibuprofen (ADVIL,MOTRIN) 200 MG tablet Take 400 mg by mouth every 8 (eight) hours as needed for headache.   03/27/2017 at Unknown time  . losartan (COZAAR) 25 MG tablet Take 12.5 mg by mouth daily.  0 03/27/2017 at Unknown time  . medroxyPROGESTERone (PROVERA) 5 MG tablet Take 5 mg by mouth daily.   03/27/2017 at Unknown time  . metFORMIN (GLUCOPHAGE) 1000 MG tablet Take 1,000 mg by mouth 2 (two) times daily.   5 03/27/2017 at Unknown time  . nitroGLYCERIN (NITROSTAT) 0.4 MG SL tablet Place 0.4 mg under the tongue every 5 (five) minutes as needed for chest pain.   03/27/2017 at Unknown time  . potassium chloride SA (K-DUR,KLOR-CON) 20 MEQ tablet Take 20 mEq by mouth daily.    03/27/2017 at Unknown time  . pregabalin (LYRICA) 75 MG capsule Take 75 mg by mouth 2 (two) times daily.   03/27/2017 at Unknown time  . torsemide (DEMADEX) 20 MG tablet  TAKE 2 TABLETS BY MOUTH DAILY 60 tablet 6 03/27/2017 at Unknown time  . aspirin 81 MG tablet Take 81 mg by mouth daily.   03/22/2017   Allergies:  Allergies  Allergen Reactions  . Bee Venom Anaphylaxis and Swelling  . Sulfa Drugs Cross Reactors Nausea And Vomiting    Family History  Problem Relation Age of Onset  . Heart attack Father    Social History:  reports that she has been smoking Cigarettes.  She started smoking about 27 years ago. She has a 10.00 pack-year smoking history. She has never used smokeless tobacco. She reports that she does not drink alcohol or  use drugs.  Review of Systems  Genitourinary: Positive for flank pain and hematuria.  All other systems reviewed and are negative.   Physical Exam:  Vital signs in last 24 hours: Temp:  [98.4 F (36.9 C)] 98.4 F (36.9 C) (07/02 0646) Pulse Rate:  [87-88] 87 (07/02 0715) Resp:  [16-29] 28 (07/02 0725) BP: (107-110)/(61-64) 107/61 (07/02 0725) SpO2:  [97 %] 97 % (07/02 0725) Physical Exam  Constitutional: She is oriented to person, place, and time. She appears well-developed and well-nourished.  HENT:  Head: Normocephalic and atraumatic.  Eyes: EOM are normal. Pupils are equal, round, and reactive to light.  Neck: Normal range of motion. No thyromegaly present.  Cardiovascular: Normal rate and regular rhythm.   Respiratory: Effort normal. No respiratory distress.  GI: Soft. She exhibits no distension.  Musculoskeletal: Normal range of motion. She exhibits no edema.  Neurological: She is alert and oriented to person, place, and time.  Skin: Skin is warm and dry.  Psychiatric: She has a normal mood and affect. Her behavior is normal. Judgment and thought content normal.    Laboratory Data:  Results for orders placed or performed during the hospital encounter of 03/28/17 (from the past 24 hour(s))  Glucose, capillary     Status: Abnormal   Collection Time: 03/28/17  7:08 AM  Result Value Ref Range   Glucose-Capillary 131 (H) 65 - 99 mg/dL   No results found for this or any previous visit (from the past 240 hour(s)). Creatinine:  Recent Labs  03/22/17 1307  CREATININE 0.74   Baseline Creatinine: 0.7  Impression/Assessment:  36yo with left ureteral calculus  Plan:  The risks/benefits/alternatives to L URS was explained to the patient and she understands and wishes to proceed with surgery  Wilkie Aye 03/28/2017, 7:31 AM

## 2017-03-28 NOTE — Anesthesia Preprocedure Evaluation (Signed)
Anesthesia Evaluation  Patient identified by MRN, date of birth, ID band Patient awake    Reviewed: Allergy & Precautions, NPO status , Patient's Chart, lab work & pertinent test results  Airway Mallampati: II  TM Distance: >3 FB     Dental  (+) Poor Dentition, Missing, Chipped, Dental Advisory Given   Pulmonary Current Smoker,    breath sounds clear to auscultation       Cardiovascular hypertension, + CAD and + Past MI   Rhythm:Regular Rate:Normal     Neuro/Psych  Headaches,    GI/Hepatic Neg liver ROS, GI history noted. CG   Endo/Other  diabetes, Type 2  Renal/GU Renal disease     Musculoskeletal   Abdominal   Peds  Hematology   Anesthesia Other Findings   Reproductive/Obstetrics                             Anesthesia Physical Anesthesia Plan  ASA: III  Anesthesia Plan: General   Post-op Pain Management:    Induction: Intravenous, Rapid sequence and Cricoid pressure planned  PONV Risk Score and Plan:   Airway Management Planned: Oral ETT  Additional Equipment:   Intra-op Plan:   Post-operative Plan: Extubation in OR  Informed Consent: I have reviewed the patients History and Physical, chart, labs and discussed the procedure including the risks, benefits and alternatives for the proposed anesthesia with the patient or authorized representative who has indicated his/her understanding and acceptance.   Dental advisory given  Plan Discussed with: CRNA and Anesthesiologist  Anesthesia Plan Comments:         Anesthesia Quick Evaluation

## 2017-03-28 NOTE — Addendum Note (Signed)
Addendum  created 03/28/17 1013 by Franco Nones, CRNA   Sign clinical note

## 2017-03-28 NOTE — Anesthesia Postprocedure Evaluation (Signed)
Anesthesia Post Note  Patient: [name]  Procedure(s) Performed: Procedure(s) (LRB): LEFT STENT REMOVAL LEFT RETROGRADE PYELOGRAM LEFT URETEROSCOPY   LASER LITHOTRIPSY  AND  STENT REPLACEMENT (Left)  Patient location during evaluation: PACU Anesthesia Type: General Level of consciousness: awake and alert, oriented and patient cooperative Pain management: pain level controlled Vital Signs Assessment: post-procedure vital signs reviewed and stable Respiratory status: spontaneous breathing Cardiovascular status: blood pressure returned to baseline and stable Postop Assessment: no headache, adequate PO intake and no signs of nausea or vomiting Anesthetic complications: no     Last Vitals:  Vitals:   03/28/17 0725 03/28/17 0856  BP: 107/61 111/68  Pulse:  84  Resp: (!) 28 (!) 22  Temp:  37.1 C    Last Pain:  Vitals:   03/28/17 0856  TempSrc: Oral                 Quandra Fedorchak

## 2017-03-28 NOTE — Anesthesia Postprocedure Evaluation (Signed)
Anesthesia Post Note  Patient: [name]  Procedure(s) Performed: Procedure(s) (LRB): LEFT STENT REMOVAL LEFT RETROGRADE PYELOGRAM LEFT URETEROSCOPY   LASER LITHOTRIPSY  AND  STENT REPLACEMENT (Left)  Patient location during evaluation: PACU Anesthesia Type: General Level of consciousness: awake and alert Pain management: satisfactory to patient Vital Signs Assessment: post-procedure vital signs reviewed and stable Respiratory status: spontaneous breathing Cardiovascular status: blood pressure returned to baseline Postop Assessment: no signs of nausea or vomiting Anesthetic complications: no     Last Vitals:  Vitals:   03/28/17 0930 03/28/17 0939  BP: 114/62 (!) 100/51  Pulse: 78 79  Resp: (!) 23 18  Temp:  36.8 C    Last Pain:  Vitals:   03/28/17 0939  TempSrc: Oral                 Geovanie Winnett

## 2017-03-28 NOTE — Op Note (Signed)
.  Preoperative diagnosis: Left ureteral stone  Postoperative diagnosis: Same  Procedure: 1 cystoscopy 2. Left retrograde pyelography 3.  Intraoperative fluoroscopy, under one hour, with interpretation 4.  Left ureteroscopic stone manipulation with laser lithotripsy 5.  Left 6 x 24 JJ ureteral stent exchange  Attending: Cleda Mccreedy  Anesthesia: General  Estimated blood loss: None  Drains: Left 6 x 24 JJ ureteral stent without tether  Specimens: stone for analysis  Antibiotics: Cipro  Findings: left distal ureteral stone. Moderate hydronephrosis. No masses/lesions in the bladder. Ureteral orifices in normal anatomic location.  Indications: Patient is a 36 year old female with a history of left ureteral calculus and who had a ureteral stent placed for sepsis.  After discussing treatment options, she decided proceed with left ureteroscopic stone manipulation.  Procedure her in detail: The patient was brought to the operating room and a brief timeout was done to ensure correct patient, correct procedure, correct site.  General anesthesia was administered patient was placed in dorsal lithotomy position.  Her genitalia was then prepped and draped in usual sterile fashion.  A rigid 22 French cystoscope was passed in the urethra and the bladder.  Bladder was inspected free masses or lesions.  the ureteral orifices were in the normal orthotopic locations. Using a grasper the ureteral stent was through to the urethral meatus. A zipwire was then advanced through the stent and up to the renal pelvis. a 6 french ureteral catheter was then instilled into the left ureteral orifice.  a gentle retrograde was obtained and findings noted above.    we then removed the cystoscope and cannulated the left ureteral orifice with a semirigid ureteroscope.   We encountered the stone at the distal ureter. using a 200 nm laser fiber and fragmented the stone into smaller pieces.  the pieces were then removed with a  Ngage basket. once all stone fragments were removed we then placed a 6 x 24 double-j ureteral stent over the original zip wire.   We removed the wire and good coil was noted in the the renal pelvis under fluoroscopy and the bladder under direct vision.   the stone fragments were then removed from the bladder and sent for analysis.   the bladder was then drained and this concluded the procedure which was well tolerated by patient.  Complications: None  Condition: Stable, extubated, transferred to PACU  Plan: Patient is to be discharged home as to follow-up in 2 weeks for stent removal.

## 2017-03-28 NOTE — Progress Notes (Signed)
Husband [name] called in inquire how many stones were removed.  "I am at home with our child".   I reviewed the operative note with patient spouse, size of stone was indeterminate and laser was used and became fragments.  Husband verbalized understanding.

## 2017-03-28 NOTE — Discharge Instructions (Signed)
Ureteral Stent Implantation, Care After Refer to this sheet in the next few weeks. These instructions provide you with information about caring for yourself after your procedure. Your health care provider may also give you more specific instructions. Your treatment has been planned according to current medical practices, but problems sometimes occur. Call your health care provider if you have any problems or questions after your procedure. What can I expect after the procedure? After the procedure, it is common to have:  Nausea.  Mild pain when you urinate. You may feel this pain in your lower back or lower abdomen. Pain should stop within a few minutes after you urinate. This may last for up to 1 week.  A small amount of blood in your urine for several days.  Follow these instructions at home:  Medicines  Take over-the-counter and prescription medicines only as told by your health care provider.  If you were prescribed an antibiotic medicine, take it as told by your health care provider. Do not stop taking the antibiotic even if you start to feel better.  Do not drive for 24 hours if you received a sedative.  Do not drive or operate heavy machinery while taking prescription pain medicines. Activity  Return to your normal activities as told by your health care provider. Ask your health care provider what activities are safe for you.  Do not lift anything that is heavier than 10 lb (4.5 kg). Follow this limit for 1 week after your procedure, or for as long as told by your health care provider. General instructions  Watch for any blood in your urine. Call your health care provider if the amount of blood in your urine increases.  If you have a catheter: ? Follow instructions from your health care provider about taking care of your catheter and collection bag. ? Do not take baths, swim, or use a hot tub until your health care provider approves.  Drink enough fluid to keep your urine  clear or pale yellow.  Keep all follow-up visits as told by your health care provider. This is important. Contact a health care provider if:  You have pain that gets worse or does not get better with medicine, especially pain when you urinate.  You have difficulty urinating.  You feel nauseous or you vomit repeatedly during a period of more than 2 days after the procedure. Get help right away if:  Your urine is dark red or has blood clots in it.  You are leaking urine (have incontinence).  The end of the stent comes out of your urethra.  You cannot urinate.  You have sudden, sharp, or severe pain in your abdomen or lower back.  You have a fever. This information is not intended to replace advice given to you by your health care provider. Make sure you discuss any questions you have with your health care provider. Document Released: 05/16/2013 Document Revised: 02/19/2016 Document Reviewed: 03/28/2015 Elsevier Interactive Patient Education  2018 [employer].  PATIENT INSTRUCTIONS POST-ANESTHESIA  IMMEDIATELY FOLLOWING SURGERY:  Do not drive or operate machinery for the first twenty four hours after surgery.  Do not make any important decisions for twenty four hours after surgery or while taking narcotic pain medications or sedatives.  If you develop intractable nausea and vomiting or a severe headache please notify your doctor immediately.  FOLLOW-UP:  Please make an appointment with your surgeon as instructed. You do not need to follow up with anesthesia unless specifically instructed to do so.  WOUND CARE INSTRUCTIONS (if applicable):  Keep a dry clean dressing on the anesthesia/puncture wound site if there is drainage.  Once the wound has quit draining you may leave it open to air.  Generally you should leave the bandage intact for twenty four hours unless there is drainage.  If the epidural site drains for more than 36-48 hours please call the anesthesia  department.  QUESTIONS?:  Please feel free to call your physician or the hospital operator if you have any questions, and they will be happy to assist you.      PATIENT INSTRUCTIONS POST-ANESTHESIA  IMMEDIATELY FOLLOWING SURGERY:  Do not drive or operate machinery for the first twenty four hours after surgery.  Do not make any important decisions for twenty four hours after surgery or while taking narcotic pain medications or sedatives.  If you develop intractable nausea and vomiting or a severe headache please notify your doctor immediately.  FOLLOW-UP:  Please make an appointment with your surgeon as instructed. You do not need to follow up with anesthesia unless specifically instructed to do so.  WOUND CARE INSTRUCTIONS (if applicable):  Keep a dry clean dressing on the anesthesia/puncture wound site if there is drainage.  Once the wound has quit draining you may leave it open to air.  Generally you should leave the bandage intact for twenty four hours unless there is drainage.  If the epidural site drains for more than 36-48 hours please call the anesthesia department.  QUESTIONS?:  Please feel free to call your physician or the hospital operator if you have any questions, and they will be happy to assist you.

## 2017-03-29 ENCOUNTER — Encounter (HOSPITAL_COMMUNITY): Payer: Self-pay | Admitting: Urology

## 2017-04-06 LAB — STONE ANALYSIS
Ca Oxalate,Dihydrate: 10 %
Ca Oxalate,Monohydr.: 75 %
Ca phos cry stone ql IR: 15 %
Stone Weight KSTONE: 43.5 mg

## 2017-04-13 ENCOUNTER — Ambulatory Visit (INDEPENDENT_AMBULATORY_CARE_PROVIDER_SITE_OTHER): Payer: Medicaid - Out of State | Admitting: Urology

## 2017-04-13 DIAGNOSIS — N200 Calculus of kidney: Secondary | ICD-10-CM | POA: Diagnosis not present

## 2017-04-15 ENCOUNTER — Other Ambulatory Visit: Payer: Self-pay | Admitting: Urology

## 2017-04-15 DIAGNOSIS — N200 Calculus of kidney: Secondary | ICD-10-CM

## 2017-05-02 ENCOUNTER — Other Ambulatory Visit: Payer: Self-pay | Admitting: Cardiology

## 2017-05-16 ENCOUNTER — Ambulatory Visit (HOSPITAL_COMMUNITY)
Admission: RE | Admit: 2017-05-16 | Discharge: 2017-05-16 | Disposition: A | Discharge status: Home / Self Care | Payer: Medicaid - Out of State | Source: Ambulatory Visit | Attending: Urology | Admitting: Urology

## 2017-05-16 DIAGNOSIS — N200 Calculus of kidney: Secondary | ICD-10-CM

## 2017-05-16 DIAGNOSIS — R937 Abnormal findings on diagnostic imaging of other parts of musculoskeletal system: Secondary | ICD-10-CM | POA: Insufficient documentation

## 2017-05-16 DIAGNOSIS — Z09 Encounter for follow-up examination after completed treatment for conditions other than malignant neoplasm: Secondary | ICD-10-CM | POA: Insufficient documentation

## 2017-05-16 DIAGNOSIS — Z87442 Personal history of urinary calculi: Secondary | ICD-10-CM | POA: Insufficient documentation

## 2017-05-25 ENCOUNTER — Ambulatory Visit: Payer: Medicaid - Out of State | Admitting: Urology

## 2017-06-02 ENCOUNTER — Other Ambulatory Visit: Payer: Self-pay | Admitting: Cardiology

## 2017-06-28 ENCOUNTER — Ambulatory Visit: Payer: Medicaid - Out of State | Admitting: Cardiology

## 2017-07-10 IMAGING — DX DG ABDOMEN 1V
2 series · 2 of 2 positions shown · non-contrast
Comparison: CT 02/15/2017 .

EXAM:
ABDOMEN - 1 VIEW

[abdomen kub (1 of 2)]
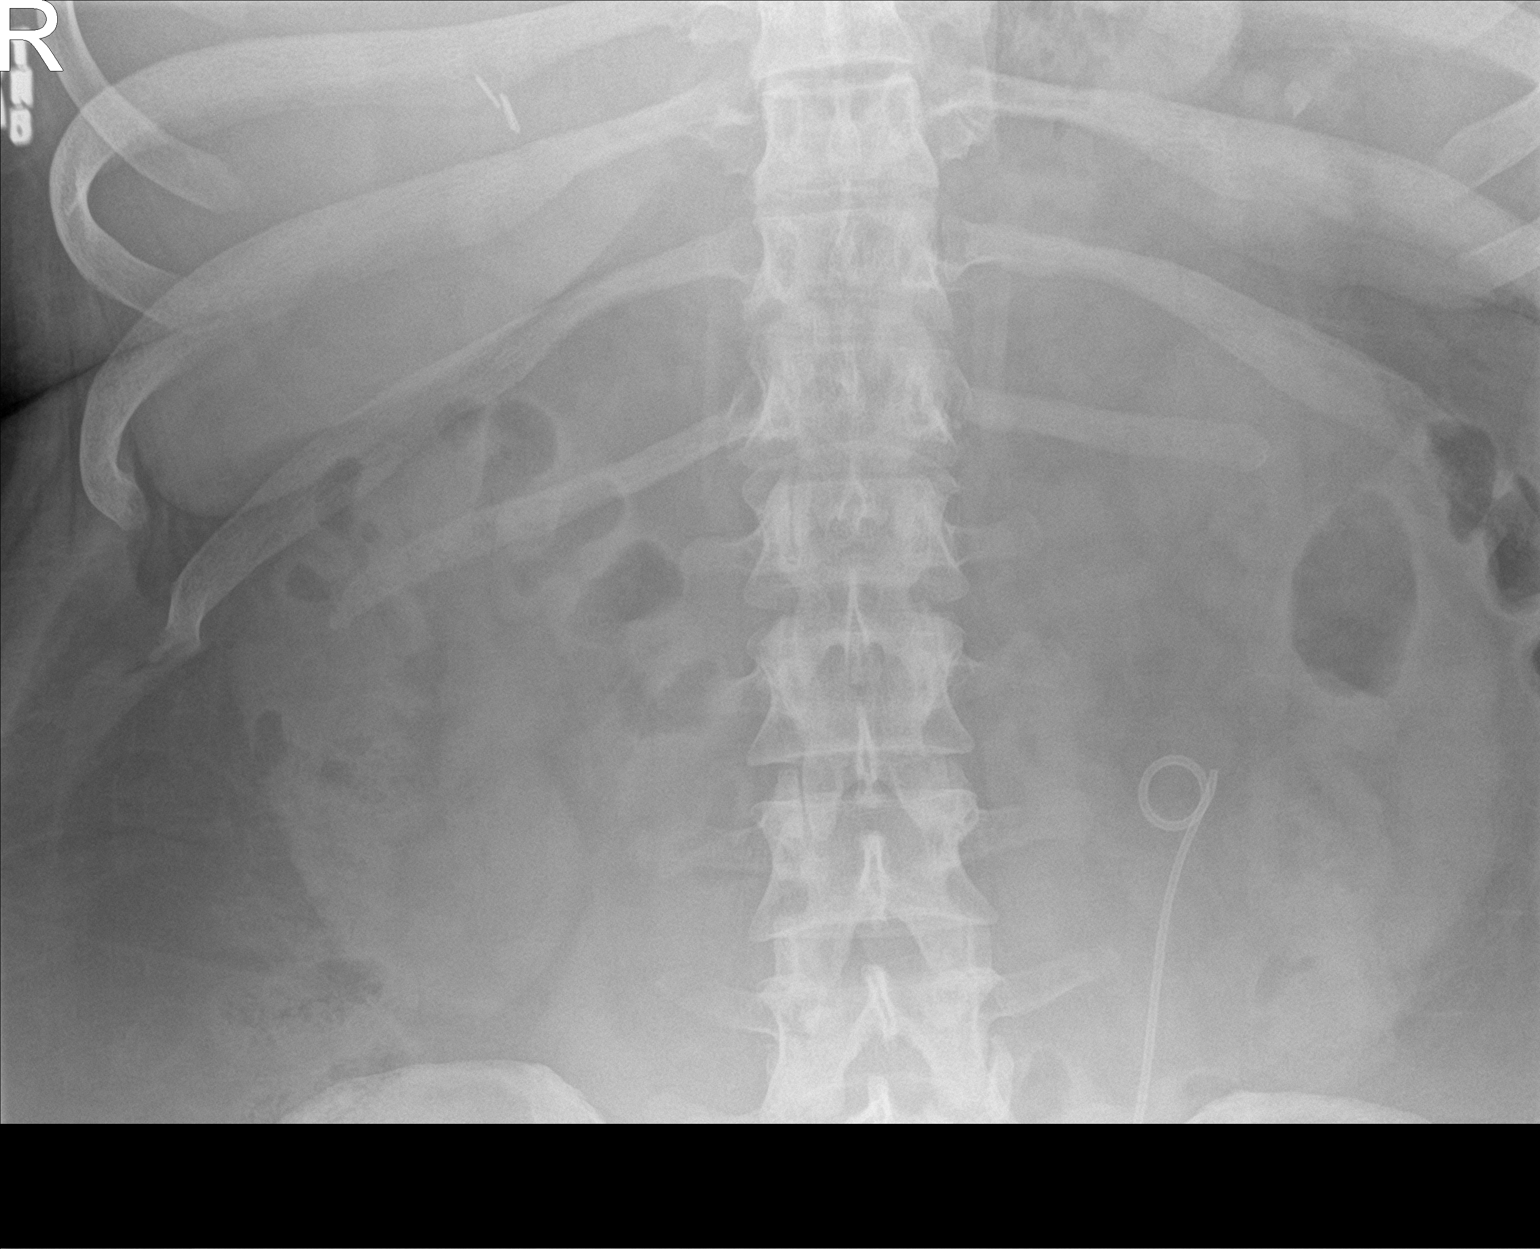

[abdomen kub (2 of 2)]
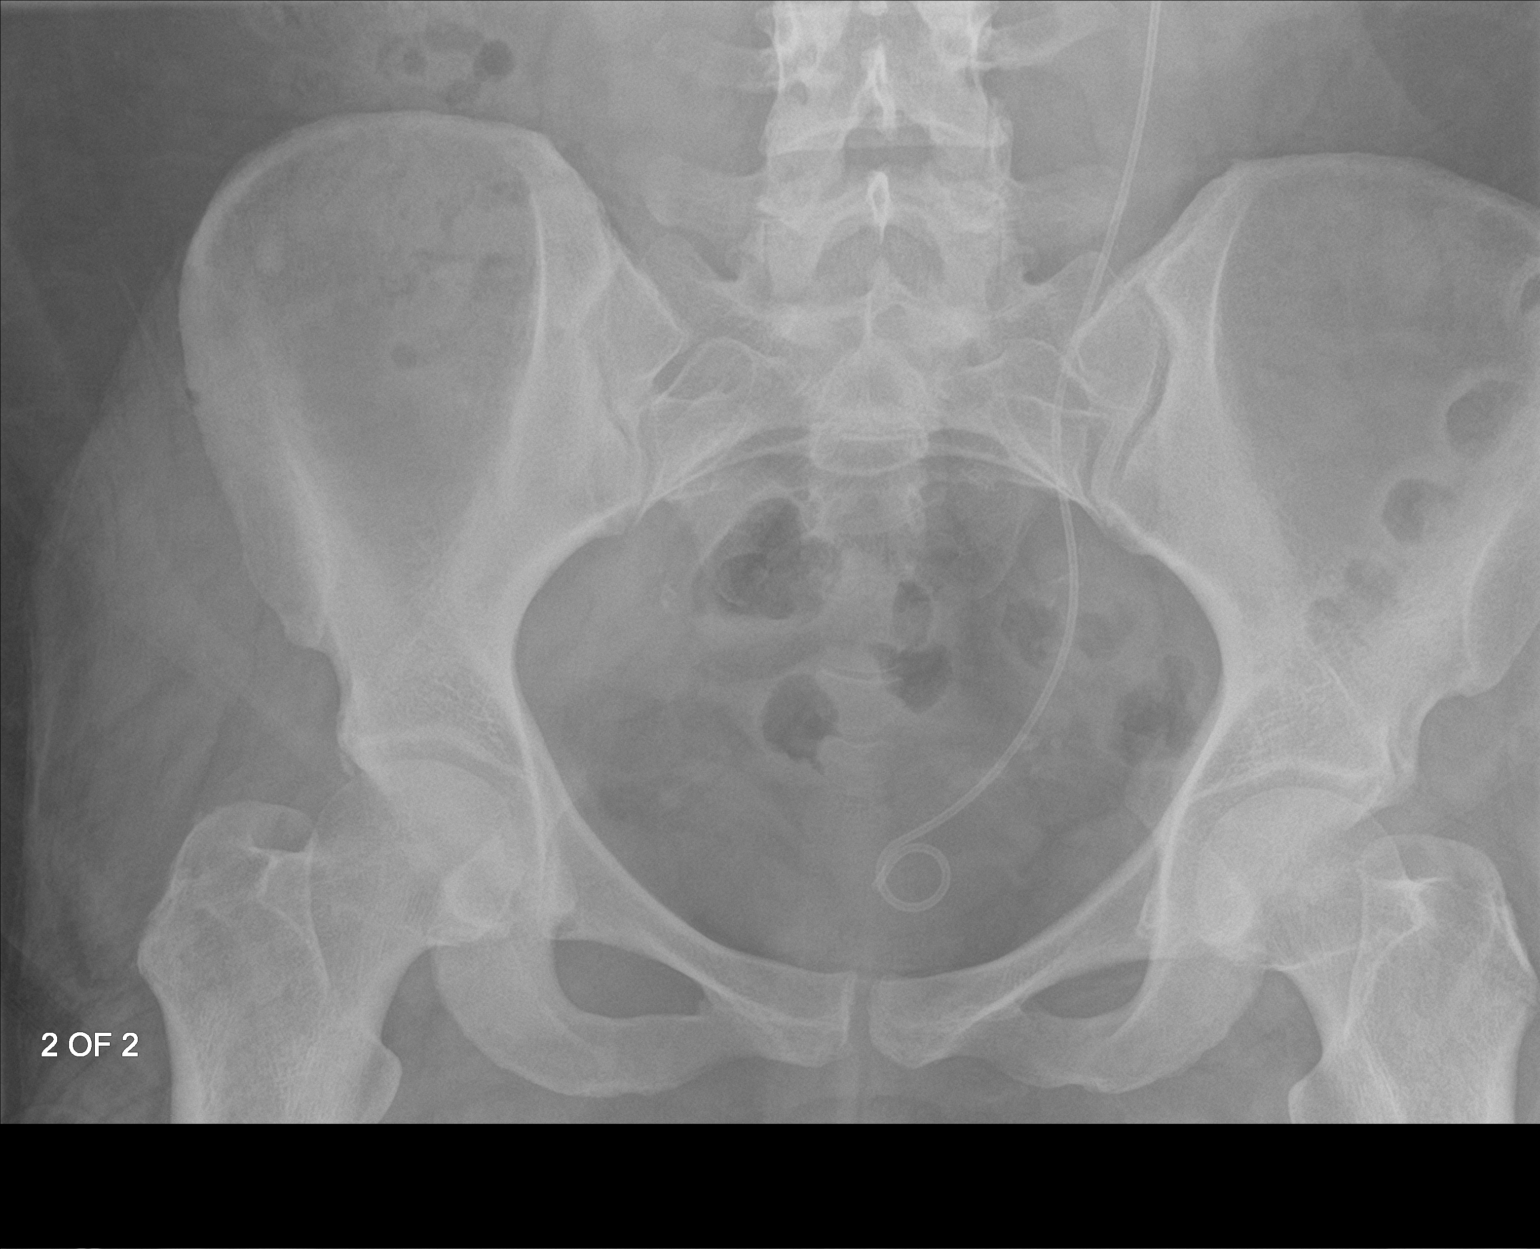

[2 of 2 positions shown; findings below may reference images not displayed]

FINDINGS: Surgical clips right upper quadrant. Double-J left ureteral stent
noted good anatomic position. Tiny calcifications in the left pelvis
may represent phleboliths. Residual distal left ureteral stone
fragments cannot be completely excluded. No bowel distention or free
air. No acute bony abnormality. Tiny sclerotic focus noted in the
right iliac wing most likely tiny bone island.The bowel gas pattern
is normal. No radio-opaque calculi or other significant radiographic
abnormality are seen.
IMPRESSION: Double-J left ureteral stent noted in good anatomic position. Tiny
calcifications in the left pelvis may represent phleboliths.
Residual distal left ureteral stone fragments cannot be completely
excluded .

## 2017-07-11 ENCOUNTER — Ambulatory Visit: Payer: Medicaid - Out of State | Admitting: Cardiology

## 2017-07-13 ENCOUNTER — Ambulatory Visit: Payer: Medicaid - Out of State | Admitting: Urology

## 2017-08-03 ENCOUNTER — Ambulatory Visit: Payer: Medicaid - Out of State | Admitting: Cardiology

## 2017-09-07 ENCOUNTER — Ambulatory Visit: Payer: Medicaid - Out of State | Admitting: Urology

## 2017-10-04 ENCOUNTER — Encounter: Payer: Self-pay | Admitting: Cardiology

## 2017-10-04 ENCOUNTER — Ambulatory Visit: Payer: Medicaid Other | Admitting: Cardiology

## 2017-10-04 VITALS — BP 104/68 | HR 110 | Ht 70.0 in | Wt 381.0 lb

## 2017-10-04 DIAGNOSIS — I2510 Atherosclerotic heart disease of native coronary artery without angina pectoris: Secondary | ICD-10-CM | POA: Diagnosis not present

## 2017-10-04 DIAGNOSIS — I10 Essential (primary) hypertension: Secondary | ICD-10-CM | POA: Diagnosis not present

## 2017-10-04 DIAGNOSIS — I5032 Chronic diastolic (congestive) heart failure: Secondary | ICD-10-CM

## 2017-10-04 DIAGNOSIS — Z79899 Other long term (current) drug therapy: Secondary | ICD-10-CM | POA: Diagnosis not present

## 2017-10-04 DIAGNOSIS — R0602 Shortness of breath: Secondary | ICD-10-CM

## 2017-10-04 DIAGNOSIS — E782 Mixed hyperlipidemia: Secondary | ICD-10-CM | POA: Diagnosis not present

## 2017-10-04 MED ORDER — PREGABALIN 75 MG PO CAPS: 75.0000 mg | ORAL_CAPSULE | Freq: Two times a day (BID) | ORAL | 1 refills | Status: DC

## 2017-10-04 MED ORDER — ATORVASTATIN CALCIUM 80 MG PO TABS: 80.0000 mg | ORAL_TABLET | Freq: Every day | ORAL | 3 refills | Status: DC

## 2017-10-04 MED ORDER — POTASSIUM CHLORIDE CRYS ER 20 MEQ PO TBCR: 20.0000 meq | EXTENDED_RELEASE_TABLET | Freq: Every day | ORAL | 3 refills | Status: DC

## 2017-10-04 MED ORDER — METFORMIN HCL 1000 MG PO TABS: 1000.0000 mg | ORAL_TABLET | Freq: Two times a day (BID) | ORAL | 1 refills | Status: DC

## 2017-10-04 MED ORDER — NITROGLYCERIN 0.4 MG SL SUBL: 0.4000 mg | SUBLINGUAL_TABLET | SUBLINGUAL | 3 refills | Status: DC | PRN

## 2017-10-04 MED ORDER — CARVEDILOL 25 MG PO TABS: ORAL_TABLET | ORAL | 6 refills | Status: DC

## 2017-10-04 MED ORDER — LOSARTAN POTASSIUM 25 MG PO TABS: 12.5000 mg | ORAL_TABLET | Freq: Every day | ORAL | 3 refills | Status: DC

## 2017-10-04 MED ORDER — TORSEMIDE 20 MG PO TABS: 40.0000 mg | ORAL_TABLET | Freq: Every day | ORAL | 6 refills | Status: DC

## 2017-10-04 NOTE — Progress Notes (Signed)
Clinical Summary [name] is a 37 y.o.female seen today for follow up of the following medical problems.   1. CAD - prior anterior MI 09/2011, 95% lesion in mid LAD and ramus with 95% ostial stenosis. LVEF 55% by LV gram, apical hypokinesis. BMS to mid LAD and cutting balloon angioplasty of ramus.  - repeat cath 02/2012 with patent vessels and stent - 09/2011 Echo: LVEF 50% - 12/2013 Lexiscan large anteroseptal ischemia - cath 12/2013 with patent vessels - cath 02/2016 [facility] in setting of NSTEMI showed LM patent, LAD patent with patent stent, distal LAD 30-40%, LCX patent, RCA 30-40%. LVEDP 40, PCWP 30 mean PA 32, CI 4.93 - echo 02/2016 [place] LVEF 50%, apical akinesis -started on torsemide 40mg  daily. Started on plavix 75mg  daily at discharge.    - plavix stoppded last visit - no recent chest pain. [name] with some SOB at times, worst with cold weather.   2. Chronic diastolic HF - edema overall controlled with diuretics.    3. HTN - compliant with meds - she reports by home bp monitor can be as high as 160s. She has not compared her monitor to any others.   4. Hyperlipidemia - compliant with statins.   5. Kidney stones - upcoming surgery  6. SOB - often brought on by cold weather. Coughing/wheezing. History of tobacco use   Past Medical History:  Diagnosis Date  . Anxiety   . Chronic kidney disease    history of kidney stones  . Coronary artery disease 09/2011   a. Anterior MI. PCI and BMS to LAD and angioplsty to ostial Ramus;  b. 02/2012 Cath patent stent and ptca site, med rx.  . Diabetes mellitus   . Hyperlipidemia   . Hypertension   . Migraines   . Morbid obesity (HCC)   . Myocardial infarct (HCC) 09/2011  . Myocardial infarction (HCC) 02/2016     Allergies  Allergen Reactions  . Bee Venom Anaphylaxis and Swelling  . Sulfa Drugs Cross Reactors Nausea And Vomiting     Current Outpatient Medications  Medication Sig Dispense Refill  .  acetaminophen (TYLENOL) 500 MG tablet Take 1,000 mg by mouth every 6 (six) hours as needed for fever (had fever over the weekend and took regularly).    Marland Kitchen aspirin 81 MG tablet Take 81 mg by mouth daily.    Marland Kitchen atorvastatin (LIPITOR) 80 MG tablet Take 80 mg by mouth daily.    . carvedilol (COREG) 25 MG tablet TAKE 1 TABLET BY MOUTH TWICE DAILY WITH A MEAL 60 tablet 6  . ibuprofen (ADVIL,MOTRIN) 200 MG tablet Take 400 mg by mouth every 8 (eight) hours as needed for headache.    . losartan (COZAAR) 25 MG tablet Take 12.5 mg by mouth daily.  0  . medroxyPROGESTERone (PROVERA) 5 MG tablet Take 5 mg by mouth daily.    . metFORMIN (GLUCOPHAGE) 1000 MG tablet Take 1,000 mg by mouth 2 (two) times daily.   5  . nitroGLYCERIN (NITROSTAT) 0.4 MG SL tablet Place 0.4 mg under the tongue every 5 (five) minutes as needed for chest pain.    Marland Kitchen oxyCODONE-acetaminophen (ROXICET) 5-325 MG tablet Take 1 tablet by mouth every 4 (four) hours as needed for moderate pain. 30 tablet 0  . potassium chloride SA (K-DUR,KLOR-CON) 20 MEQ tablet Take 20 mEq by mouth daily.     . pregabalin (LYRICA) 75 MG capsule Take 75 mg by mouth 2 (two) times daily.    Marland Kitchen torsemide (  DEMADEX) 20 MG tablet TAKE 2 TABLETS BY MOUTH DAILY 60 tablet 6   No current facility-administered medications for this visit.      Past Surgical History:  Procedure Laterality Date  . CARDIAC CATHETERIZATION  10/08/2011   LAD: 95% mid, Ramus: 95% ostial  . CESAREAN SECTION    . CHOLECYSTECTOMY    . CORONARY ANGIOPLASTY WITH STENT PLACEMENT  10/08/2011   LAD: BMS, Ramus: cutting balloon angioplasty  . CYSTOSCOPY WITH RETROGRADE PYELOGRAM, URETEROSCOPY AND STENT PLACEMENT Left 02/16/2017   Procedure: CYSTOSCOPY WITH RETROGRADE PYELOGRAM, URETEROSCOPY AND STENT PLACEMENT;  Surgeon: Ihor Gully, MD;  Location: [facility];  Service: Urology;  Laterality: Left;  . CYSTOSCOPY WITH RETROGRADE PYELOGRAM, URETEROSCOPY AND STENT PLACEMENT Left 03/28/2017   Procedure: LEFT  STENT REMOVAL LEFT RETROGRADE PYELOGRAM LEFT URETEROSCOPY   LASER LITHOTRIPSY  AND  STENT REPLACEMENT;  Surgeon: Malen Gauze, MD;  Location: [facility];  Service: Urology;  Laterality: Left;  . LEFT HEART CATHETERIZATION WITH CORONARY ANGIOGRAM N/A 10/08/2011   Procedure: LEFT HEART CATHETERIZATION WITH CORONARY ANGIOGRAM;  Surgeon: Peter M Swaziland, MD;  Location: [facility] CATH LAB;  Service: Cardiovascular;  Laterality: N/A;  . LEFT HEART CATHETERIZATION WITH CORONARY ANGIOGRAM N/A 03/02/2012   Procedure: LEFT HEART CATHETERIZATION WITH CORONARY ANGIOGRAM;  Surgeon: Kathleene Hazel, MD;  Location: [facility] CATH LAB;  Service: Cardiovascular;  Laterality: N/A;  . LEFT HEART CATHETERIZATION WITH CORONARY ANGIOGRAM N/A 01/04/2014   Procedure: LEFT HEART CATHETERIZATION WITH CORONARY ANGIOGRAM;  Surgeon: Lennette Bihari, MD;  Location: [facility] CATH LAB;  Service: Cardiovascular;  Laterality: N/A;     Allergies  Allergen Reactions  . Bee Venom Anaphylaxis and Swelling  . Sulfa Drugs Cross Reactors Nausea And Vomiting      Family History  Problem Relation Age of Onset  . Heart attack Father      Social History Ms. [name] reports that she has been smoking cigarettes.  She started smoking about 27 years ago. She has a 10.00 pack-year smoking history. she has never used smokeless tobacco. Ms. [name] reports that she does not drink alcohol.   Review of Systems CONSTITUTIONAL: No weight loss, fever, chills, weakness or fatigue.  HEENT: Eyes: No visual loss, blurred vision, double vision or yellow sclerae.No hearing loss, sneezing, congestion, runny nose or sore throat.  SKIN: No rash or itching.  CARDIOVASCULAR: per hpi RESPIRATORY: per hpi GASTROINTESTINAL: No anorexia, nausea, vomiting or diarrhea. No abdominal pain or blood.  GENITOURINARY: No burning on urination, no polyuria NEUROLOGICAL: No headache, dizziness, syncope, paralysis, ataxia, numbness or tingling in the extremities. No change in bowel  or bladder control.  MUSCULOSKELETAL: No muscle, back pain, joint pain or stiffness.  LYMPHATICS: No enlarged nodes. No history of splenectomy.  PSYCHIATRIC: No history of depression or anxiety.  ENDOCRINOLOGIC: No reports of sweating, cold or heat intolerance. No polyuria or polydipsia.  Marland Kitchen   Physical Examination Vitals:   10/04/17 1621  BP: 104/68  Pulse: (!) 110  SpO2: 95%   Vitals:   10/04/17 1621  Weight: (!) 381 lb (172.8 kg)  Height: 5\' 10"  (1.778 m)    Gen: resting comfortably, no acute distress HEENT: no scleral icterus, pupils equal round and reactive, no palptable cervical adenopathy,  CV: RRR, no m/r/g, no jvd Resp: Clear to auscultation bilaterally GI: abdomen is soft, non-tender, non-distended, normal bowel sounds, no hepatosplenomegaly MSK: extremities are warm, no edema.  Skin: warm, no rash Neuro:  no focal deficits Psych: appropriate affect   Diagnostic Studies  02/2012 Cath Hemodynamic Findings: Central aortic pressure: 108/70  Left ventricular pressure: 123/10/21  Angiographic Findings: Left main: No obstructive disease noted.  Left Anterior Descending Artery: Large caliber vessel that courses to the apex. There is a stent present in the mid vessel that is widely patent with no restenosis. The remainder of the LAD is disease free. There is a moderate sized diagonal Zuriyah Shatz with 30% proximal stenosis.  Circumflex Artery: Dominant, large caliber vessel with no disease throughout the AV groove segment. There is small to moderate sized intermediate Deairra Halleck that has ostial 30% stenosis. The remainder of the Circumflex has no obstructive disease.  Right Coronary Artery: Small, non-dominant vessel. No disease noted.  Left Ventricular Angiogram: LVEF 50-55%.  Impression:  1. Double vessel CAD with patent stent mid LAD and patent angioplasty site at ostium of intermediate Anmol Fleck.  2. Normal LV systolic function   09/2011 Cath PROCEDURAL FINDINGS   Hemodynamics:  AO 135/92 with a mean of 114 mmHg  LV 136/25 mmHg  Coronary angiography:  Coronary dominance: Left  Left mainstem: Normal.  Left anterior descending (LAD): There is a 95% stenosis in the mid LAD immediately after the takeoff of the first diagonal. The first diagonal Eliyahu Bille is moderate in size and appears normal. The LAD stenosis does appear to be hazy.  There is a moderately large ramus intermediate Chaz Mcglasson which has a 95% ostial stenosis.  Left circumflex (LCx): This is a dominant vessel and appears normal throughout.  Right coronary artery (RCA): This is a small nondominant vessel and is normal.  Left ventriculography: Left ventricular systolic function is abnormal with apical hypokinesis. LVEF is estimated at 55%, there is no significant mitral regurgitation  PCI Note: Following the diagnostic procedure, the decision was made to proceed with PCI of the LAD. Weight-based bivalirudin was given for anticoagulation. Once a therapeutic ACT was achieved, a 5 [place] EBU guide catheter was inserted. A pro-water coronary guidewire was used to cross the lesion. The lesion was predilated with a 2.5 mm balloon. The lesion was then stented with a 3.0 x 18 mm vision stent. The stent was postdilated with a 3.25 mm noncompliant balloon. Following PCI, there was 0% residual stenosis and TIMI-3 flow. At this point the patient's pain was improved but was Abbruzzese of moderate intensity. We then proceeded to intervene on the ostial ramus intermediate lesion. This was crossed with the prowater wire. This was dilated using a 3.0 x 10 mm cutting balloon performing 3 inflations to 6 atmospheres. This showed a good angiographic result with less than 30% residual stenosis and TIMI grade 3 flow. Final angiography confirmed an excellent result. The patient tolerated the procedure well. There were no immediate procedural complications. A TR band was used for radial hemostasis. The patient was transferred to  the post catheterization recovery area for further monitoring.  PCI Data:  Vessel - LAD/Segment - mid vessel immediately after the takeoff of the first diagonal.  Percent Stenosis (pre) 95%  TIMI-flow 3.  Stent 3.0 x 18 mm vision stent.  Percent Stenosis (post) 0%  TIMI-flow (post) 3.  Second vessel: Ostial ramus intermediate Liberti Appleton  Percent stenosis: 95%.  TIMI flow 3.  3.0 mm cutting balloon  Percent stenosis (post) less than 30%  TIMI flow 3  Final Conclusions:  1. Severe 2 vessel obstructive coronary disease.  2. Overall well preserved LV systolic function with apical wall motion abnormality and ejection fraction of 55%.  3. Successful intracoronary stenting of the mid LAD with a bare-metal  stent.  4. Successful cutting balloon angioplasty of the ramus intermediate Thu Baggett.  Recommendations:  Continue therapy with Plavix. Patient reports a history of allergy to aspirin. Aggressive risk factor modification.  Disposition: Patient be observed in the intensive care unit tonight. If she has no complications she should be able to be transferred to telemetry in the morning.   12/2013 Cath HEMODYNAMICS:  Central Aorta: 100/60  Left Ventricle: 100/3  ANGIOGRAPHY:  1. Left main: Normal and trifurcated into an LAD, ramus intermediate vessel, and a dominant left circumflex coronary artery  2. LAD: Barbera Setters gave rise to a proximal large first diagonal vessel. The LAD just after the diagonal vessel had a widely patent stent. The remainder of the LAD was free of significant disease and extended to the LV apex. 3. Ramus Intermediate: No evidence for restenosis at previous site of ostial cutting balloon intervention. 4. Left circumflex: Angiographically normal. Dominant vessel, which gave rise to 2 marginal branches and in the PDA.Marland Kitchen  4. Right coronary artery: Angiographically normal, nondominant vessel  Left ventriculography revealed global LV function. There  was a suggestion of mild residual distal anterolateral hypocontractility. There was catheter-induced mitral regurgitation.  IMPRESSION:  Normal LV function with mild distal anterolateral, residual hypocontractility  No significant residual coronary obstructive disease with evidence for widely patent LAD stent, no evidence for restenosis of the ramus intermediate vessel, normal dominant left circumflex coronary artery and normal nondominant right coronary artery.    12/2013 MPI IMPRESSION: 1. Abnormal Lexiscan for ischemia  2. Large anteroseptal and apical area of ischemia  3. High risk study for major cardiac events based on area of myocardium at [facility]  4. Normal left ventricular systolic function, left ventricular ejection fraction 52%      Assessment and Plan   1. CAD - no recent symptoms, continue current meds   2. Chronic diastolic HF - no recent symptoms, euvolemic. Continue current meds, repeat renal function and electrolyte labs   3. HTN - at goal in clinic, reports some high bp's at home unclear if accurate - asked to bring her bp cuff in to compare with our clinic  4. Hyperlipidemia -  repeat lipid panel, continue statin  5. SOB - often worst with cold weather, can have cough and wheezing. History of tobacco. Probable COPD - order PFTs   F/u 4 months   Antoine Poche, M.D.

## 2017-10-04 NOTE — Patient Instructions (Addendum)
Medication Instructions:  I have sent in refills of all medications to [facility]  Labwork: TODAY   Testing/Procedures: Your physician has recommended that you have a pulmonary function test. Pulmonary Function Tests are a group of tests that measure how well air moves in and out of your lungs.    Follow-Up: Your physician wants you to follow-up in: 6 months.  You will receive a reminder letter in the mail two months in advance. If you don't receive a letter, please call our office to schedule the follow-up appointment.  Your physician recommends that you schedule a follow-up appointment in: NURSE VISIT TO CHECK HER BP CUFF  Any Other Special Instructions Will Be Listed Below (If Applicable).  Dr. Delton See [phone] (PCP)   If you need a refill on your cardiac medications before your next appointment, please call your pharmacy.

## 2017-10-05 IMAGING — US US RENAL
1 series · 14 of 25 positions shown · non-contrast
Comparison: 02/15/2017, 03/28/2017,

CLINICAL DATA: Follow-up ureteral calculus

EXAM:
RENAL / URINARY TRACT ULTRASOUND COMPLETE

[Series 1: us renal · 14 of 68 slices shown]
[im 1/68]
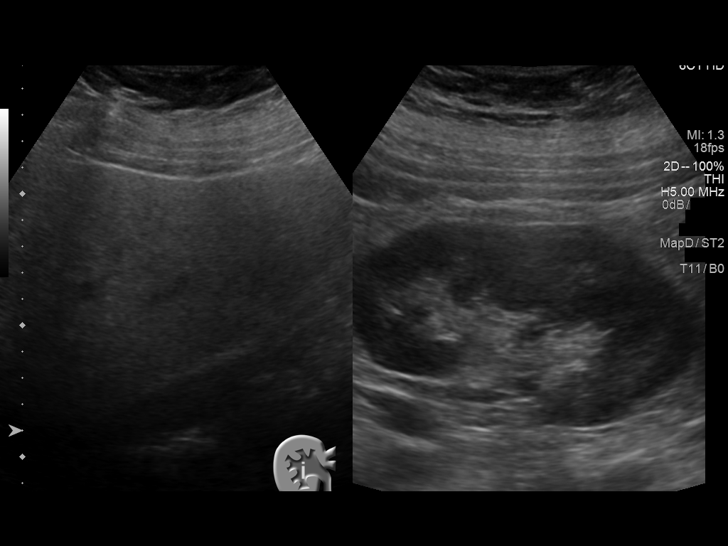
[im 6/68]
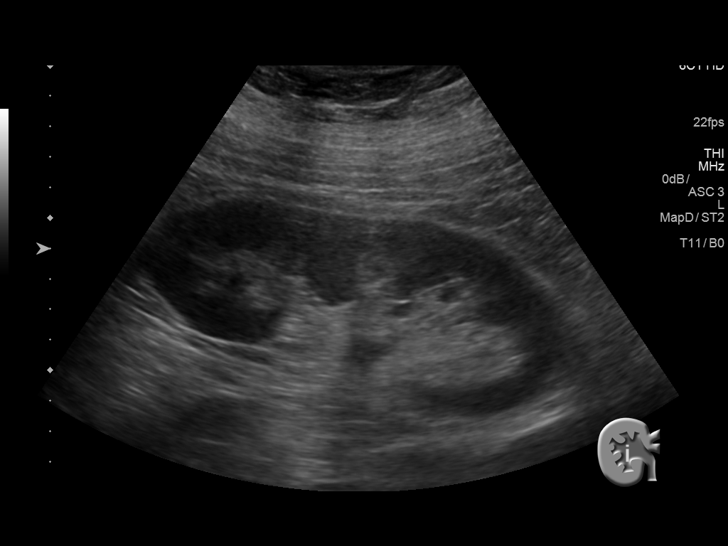
[im 12/68]
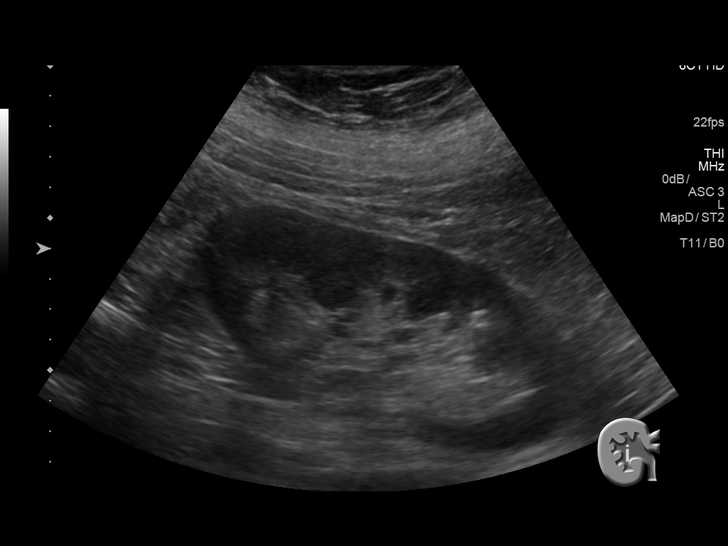
[im 17/68]
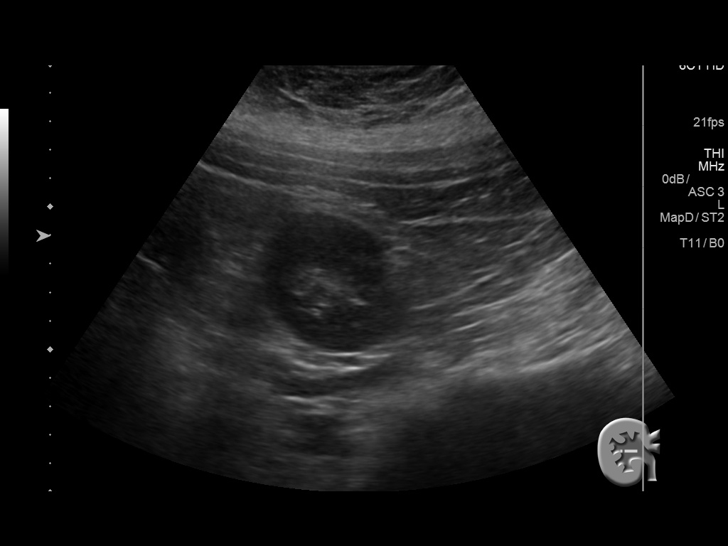
[im 23/68]
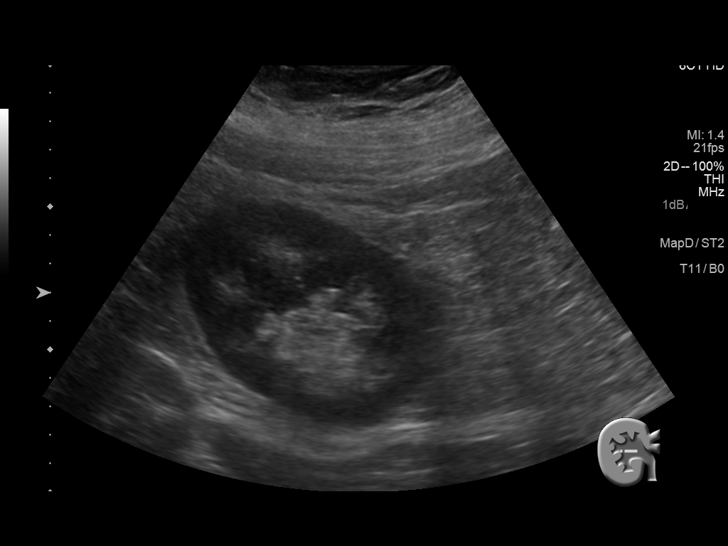
[im 26/68]
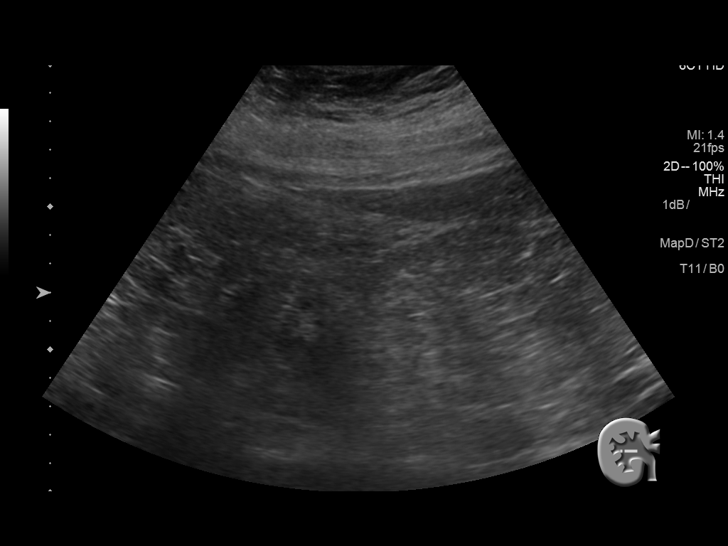
[im 31/68]
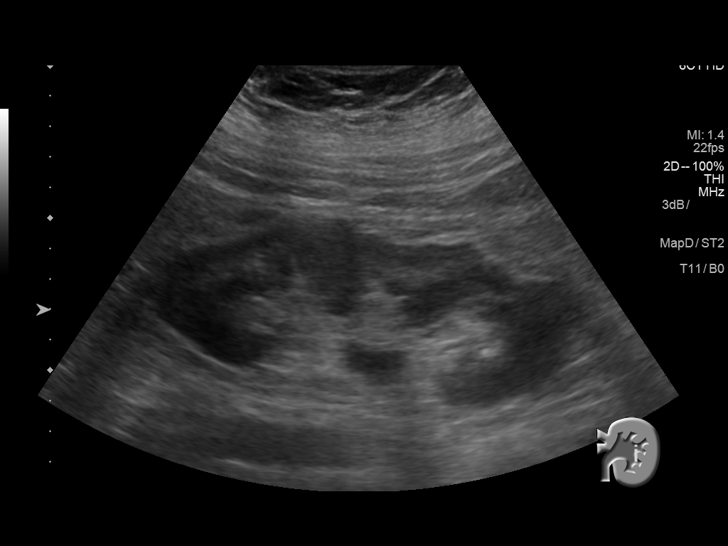
[im 37/68]
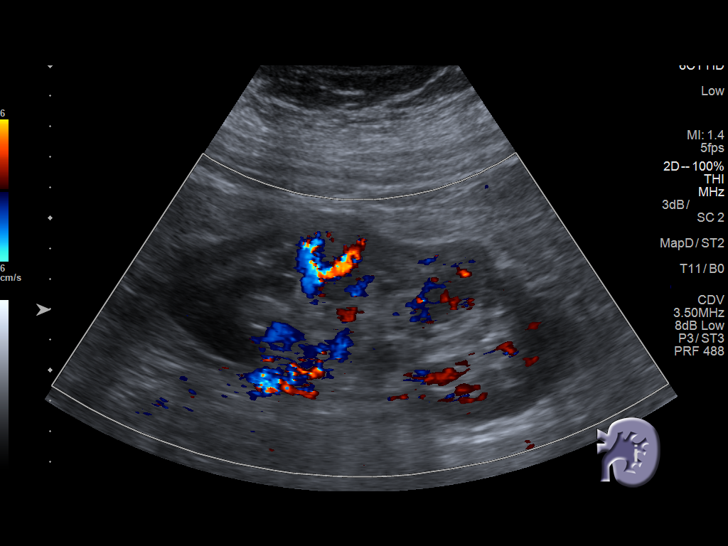
[im 42/68]
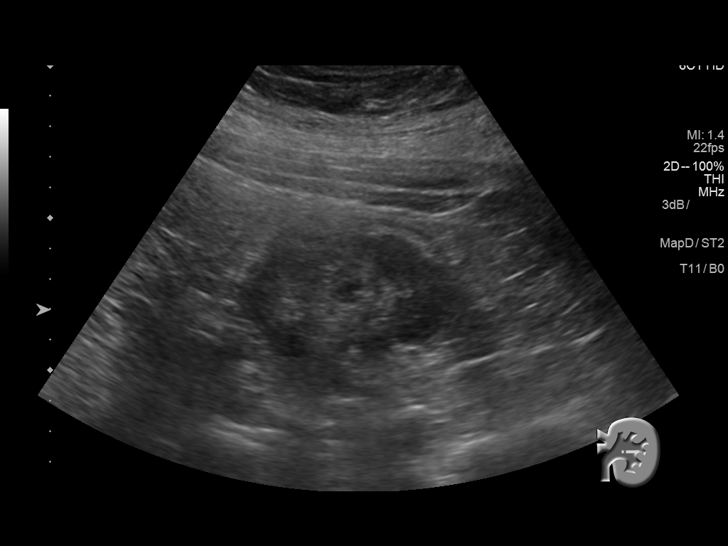
[im 45/68]
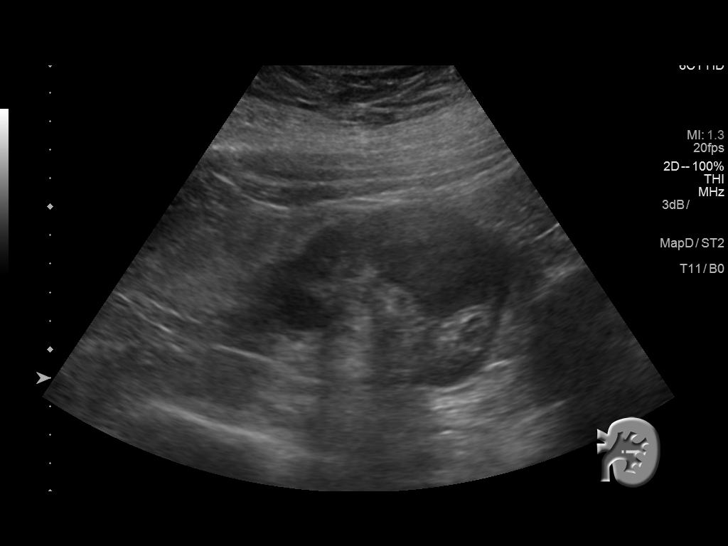
[im 51/68]
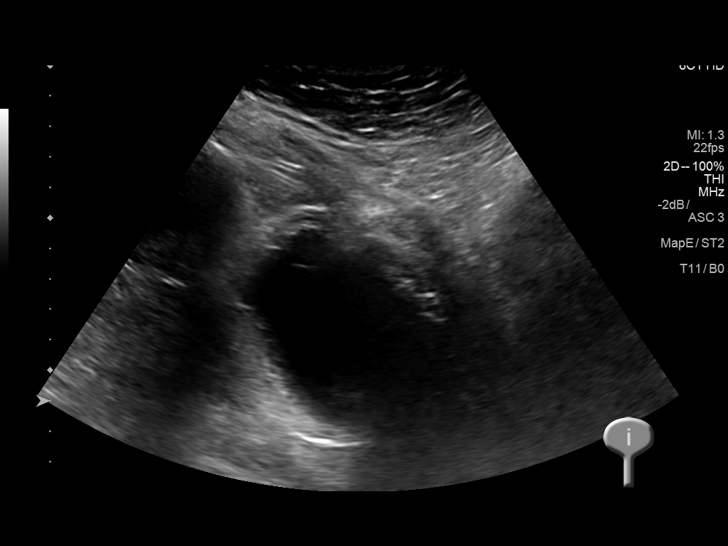
[im 56/68]
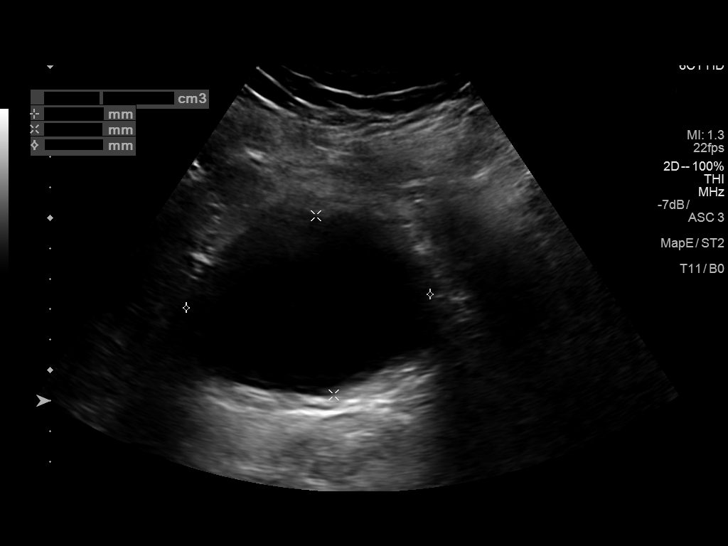
[im 62/68]
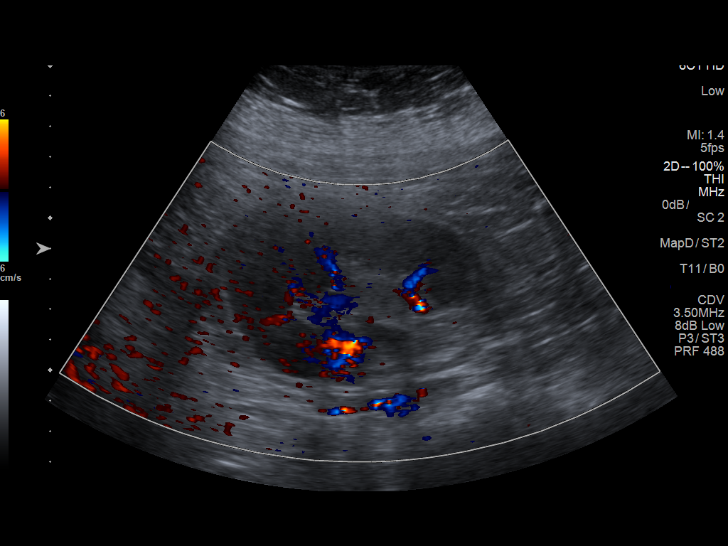
[im 68/68]
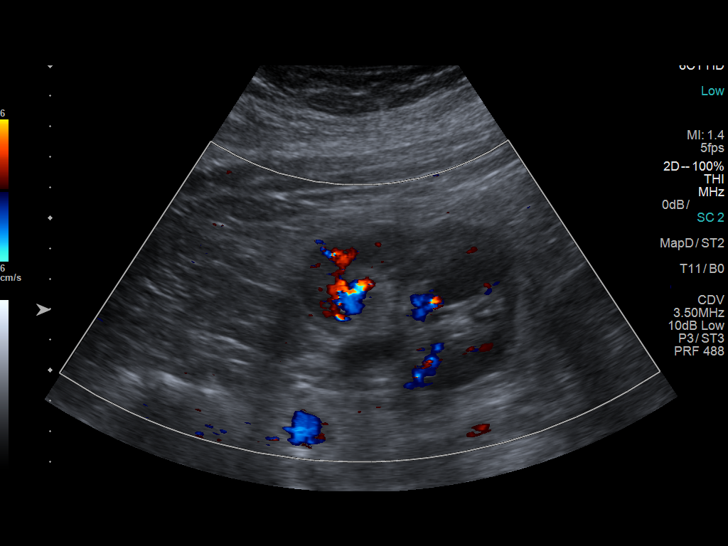

[14 of 25 positions shown; findings below may reference images not displayed]

FINDINGS: Right Kidney:

Length: 14.3 cm.. Echogenicity with is within normal limits. No
focal mass lesion or calculi are seen. Mild extrarenal pelvis is
noted.

Left Kidney:

Length: 14.2 cm.. Mild extrarenal pelvis is noted. The overall
appearance has improved significantly from the CT. No definitive
calculi are seen.

Bladder:

Predominately decompressed.
IMPRESSION: Mild extrarenal pelves bilaterally. No definitive significant
obstructive changes are seen.

## 2017-10-07 ENCOUNTER — Encounter: Payer: Self-pay | Admitting: Cardiology

## 2017-10-20 ENCOUNTER — Encounter (HOSPITAL_COMMUNITY): Payer: Medicaid - Out of State

## 2017-10-28 ENCOUNTER — Encounter: Payer: Self-pay | Admitting: Family Medicine

## 2017-10-28 ENCOUNTER — Ambulatory Visit: Payer: Medicaid Other | Admitting: Family Medicine

## 2017-10-28 ENCOUNTER — Other Ambulatory Visit: Payer: Self-pay

## 2017-10-28 DIAGNOSIS — Z8679 Personal history of other diseases of the circulatory system: Secondary | ICD-10-CM | POA: Insufficient documentation

## 2017-10-28 DIAGNOSIS — N182 Chronic kidney disease, stage 2 (mild): Secondary | ICD-10-CM

## 2017-10-28 DIAGNOSIS — M549 Dorsalgia, unspecified: Secondary | ICD-10-CM

## 2017-10-28 DIAGNOSIS — E1122 Type 2 diabetes mellitus with diabetic chronic kidney disease: Secondary | ICD-10-CM | POA: Diagnosis not present

## 2017-10-28 DIAGNOSIS — M545 Low back pain, unspecified: Secondary | ICD-10-CM

## 2017-10-28 DIAGNOSIS — I10 Essential (primary) hypertension: Secondary | ICD-10-CM | POA: Diagnosis not present

## 2017-10-28 DIAGNOSIS — E1140 Type 2 diabetes mellitus with diabetic neuropathy, unspecified: Secondary | ICD-10-CM | POA: Diagnosis not present

## 2017-10-28 DIAGNOSIS — G8929 Other chronic pain: Secondary | ICD-10-CM | POA: Insufficient documentation

## 2017-10-28 DIAGNOSIS — Z23 Encounter for immunization: Secondary | ICD-10-CM

## 2017-10-28 DIAGNOSIS — R0609 Other forms of dyspnea: Secondary | ICD-10-CM | POA: Diagnosis not present

## 2017-10-28 DIAGNOSIS — E282 Polycystic ovarian syndrome: Secondary | ICD-10-CM | POA: Diagnosis not present

## 2017-10-28 DIAGNOSIS — I152 Hypertension secondary to endocrine disorders: Secondary | ICD-10-CM | POA: Insufficient documentation

## 2017-10-28 DIAGNOSIS — R0600 Dyspnea, unspecified: Secondary | ICD-10-CM

## 2017-10-28 DIAGNOSIS — E1159 Type 2 diabetes mellitus with other circulatory complications: Secondary | ICD-10-CM | POA: Insufficient documentation

## 2017-10-28 NOTE — Patient Instructions (Signed)
Get labs today Need eye exam, referred for eye specialist Referred for urology evaluation Referred for pulmonary function test  Need old records  See me in 4-6 weeks

## 2017-10-28 NOTE — Progress Notes (Signed)
Chief Complaint  Patient presents with  . Establish Care   This is a new patient seen to establish care.  She lived in [place], recently moved down to the [place] area.  She sees a local cardiologist, desires family practice within the same health system. She has no acute complaints.  Limited old records are available.  No primary care records are available Patient has multiple medical problems. She is a diabetic.  She is on metformin.  She states her last hemoglobin A1c was 7.6.  It has not been checked in a year.  She tries to be compliant with diet.  She does not exercise.  She has known diabetic complications including neuropathy that is painful.  She takes Lyrica 75 twice daily.  She has not had an eye exam in many years.  Denies podiatry or foot problems.  Uncertain of any kidney involvement.  She does have kidney stones and is under the care of urology. She has hypertension.  Takes Cozaar.  It is well controlled. She has hyperlipidemia.  Takes Lipitor 80 a day.  Uncertain control, needs labs Patient has coronary artery disease and has had an MI she is under the care of cardiology. I have discussed the multiple health risks associated with cigarette smoking including, but not limited to, cardiovascular disease, lung disease and cancer.  I have strongly recommended that smoking be stopped.  I have reviewed the various methods of quitting including cold [place], classes, nicotine replacements and prescription medications.  I have offered assistance in this difficult process.  The patient is not interested in assistance at this time.  She insists that she used to weigh over 500 pounds, and then got down to 280.  She states that she tried to quit smoking and gained 100 pounds.  She is of the belief that if she quit smoking she will again gain weight. We discussed her morbid obesity.  Her BMI is 55.  She states that she has considered bariatric surgery.  She is never been referred for this.  She is  not ready to think about it right now, but will discuss with her family. She is under the care of GYN with polycystic ovarian syndrome.  She takes medroxyprogesterone daily.  Irregular bleeding.  Hirsutism.   Patient Active Problem List   Diagnosis Date Noted  . Essential hypertension 10/28/2017  . History of chronic CHF 10/28/2017  . PCOS (polycystic ovarian syndrome) 10/28/2017  . Diabetic neuropathy, painful (HCC) 10/28/2017  . Chronic back pain 10/28/2017  . Coronary artery disease   . DM2 (diabetes mellitus, type 2) (HCC) 10/10/2011  . Acute anterior wall MI (HCC) 10/09/2011  . Morbid obesity (HCC) 10/09/2011  . Tobacco abuse 10/09/2011  . Hyperlipidemia 10/09/2011    Outpatient Encounter Medications as of 10/28/2017  Medication Sig  . aspirin 81 MG tablet Take 81 mg by mouth daily.  Marland Kitchen atorvastatin (LIPITOR) 80 MG tablet Take 1 tablet (80 mg total) by mouth daily.  . carvedilol (COREG) 25 MG tablet TAKE 1 TABLET BY MOUTH TWICE DAILY WITH A MEAL  . ibuprofen (ADVIL,MOTRIN) 200 MG tablet Take 400 mg by mouth every 8 (eight) hours as needed for headache.  . losartan (COZAAR) 25 MG tablet Take 0.5 tablets (12.5 mg total) by mouth daily.  . medroxyPROGESTERone (PROVERA) 5 MG tablet Take 5 mg by mouth daily.  . metFORMIN (GLUCOPHAGE) 1000 MG tablet Take 1 tablet (1,000 mg total) by mouth 2 (two) times daily.  . potassium chloride SA (  K-DUR,KLOR-CON) 20 MEQ tablet Take 1 tablet (20 mEq total) by mouth daily.  . pregabalin (LYRICA) 75 MG capsule Take 1 capsule (75 mg total) by mouth 2 (two) times daily.  Marland Kitchen torsemide (DEMADEX) 20 MG tablet Take 2 tablets (40 mg total) by mouth daily.  . nitroGLYCERIN (NITROSTAT) 0.4 MG SL tablet Place 1 tablet (0.4 mg total) under the tongue every 5 (five) minutes as needed for chest pain. (Patient not taking: Reported on 10/28/2017)   No facility-administered encounter medications on file as of 10/28/2017.     Allergies  Allergen Reactions  . Bee Venom  Anaphylaxis and Swelling  . Sulfa Drugs Cross Reactors Nausea And Vomiting    Review of Systems  Constitutional: Positive for unexpected weight change. Negative for activity change and appetite change.  HENT: Negative for congestion, dental problem, postnasal drip and rhinorrhea.        No dental care  Eyes: Negative for redness and visual disturbance.       Overdue eye exam  Respiratory: Negative for cough and shortness of breath.   Cardiovascular: Positive for leg swelling. Negative for chest pain and palpitations.       Manages chronic swelling  Gastrointestinal: Positive for diarrhea. Negative for abdominal pain.       Chronic diarrhea since cholecystectomy  Genitourinary: Negative for difficulty urinating and frequency.  Musculoskeletal: Positive for arthralgias and back pain.       Chronic back and knee pain  Neurological: Negative for dizziness and headaches.  Psychiatric/Behavioral: Negative for dysphoric mood and sleep disturbance. The patient is not nervous/anxious.     BP 126/82 (BP Location: Left Arm, Patient Position: Sitting, Cuff Size: Normal)   Pulse (!) 106   Temp 98.1 F (36.7 C) (Temporal)   Resp 16   Ht 5\' 10"  (1.778 m)   Wt (!) 386 lb 4 oz (175.2 kg)   LMP 10/07/2017 (Approximate)   SpO2 98%   BMI 55.42 kg/m   Physical Exam  Constitutional: She is oriented to person, place, and time. She appears well-developed and well-nourished.  Over nourished.  Morbidly obese.  Large abdominal pannus.  HENT:  Head: Normocephalic and atraumatic.  Mouth/Throat: Oropharynx is clear and moist.  Poor dentition, caries, fracture  Eyes: Conjunctivae are normal. Pupils are equal, round, and reactive to light.  Neck: Normal range of motion. No thyromegaly present.  Cardiovascular: Normal rate, regular rhythm and normal heart sounds.  Pulmonary/Chest: Effort normal.  Decreased breath sounds  Abdominal: Bowel sounds are normal. She exhibits distension.  Obese    Musculoskeletal: Normal range of motion. She exhibits edema.  1+ edema  Neurological: She is alert and oriented to person, place, and time.  Skin: Skin is warm and dry.  Hirsutism.  Facial hair, chest hair, sideburns  Psychiatric: She has a normal mood and affect. Her behavior is normal.    ASSESSMENT/PLAN:  1. Essential hypertension Controlled  2. History of chronic CHF Under care of cardiology  3. PCOS (polycystic ovarian syndrome) By history  4. Diabetic neuropathy, painful (HCC) History controlled on Lyrica  5. Morbid obesity (HCC) Discussed diet, exercise, lifestyle, referral  6. Type 2 diabetes mellitus with stage 2 chronic kidney disease, without long-term current use of insulin (HCC) Controlled per patient - Ambulatory referral to Ophthalmology - Vitamin B12 - CBC - COMPLETE METABOLIC PANEL WITH GFR - Hemoglobin A1c - Lipid panel - TSH - Urinalysis, Routine w reflex microscopic - VITAMIN D 25 Hydroxy (Vit-D Deficiency, Fractures) - Microalbumin / creatinine  urine ratio  7. Chronic midline low back pain without sciatica Chronic  8. Dyspnea on exertion Limits exercise.  Likely obesity related and poor physical condition. - Pulmonary function test; Future  9. Needs flu shot Done - Flu Vaccine QUAD 6+ mos PF IM (Fluarix Quad PF) -Prevnar 13  Patient Instructions  Get labs today Need eye exam, referred for eye specialist Referred for urology evaluation Referred for pulmonary function test  Need old records  See me in 4-6 weeks     Eustace Moore, MD

## 2017-10-29 LAB — URINALYSIS, ROUTINE W REFLEX MICROSCOPIC
Bilirubin Urine: NEGATIVE
Glucose, UA: NEGATIVE
Hyaline Cast: NONE SEEN /LPF
Ketones, ur: NEGATIVE
Nitrite: NEGATIVE
Specific Gravity, Urine: 1.021 (ref 1.001–1.03)
pH: 5.5 (ref 5.0–8.0)

## 2017-10-29 LAB — CBC WITH DIFFERENTIAL/PLATELET
Basophils Absolute: 35 cells/uL (ref 0–200)
Basophils Relative: 0.3 %
Eosinophils Absolute: 138 cells/uL (ref 15–500)
Eosinophils Relative: 1.2 %
HCT: 39.4 % (ref 35.0–45.0)
Hemoglobin: 13.4 g/dL (ref 11.7–15.5)
Lymphs Abs: 2496 cells/uL (ref 850–3900)
MCH: 29.6 pg (ref 27.0–33.0)
MCHC: 34 g/dL (ref 32.0–36.0)
MCV: 87 fL (ref 80.0–100.0)
MPV: 9.6 fL (ref 7.5–12.5)
Monocytes Relative: 5.8 %
Neutro Abs: 8165 cells/uL — ABNORMAL HIGH (ref 1500–7800)
Neutrophils Relative %: 71 %
Platelets: 298 10*3/uL (ref 140–400)
RBC: 4.53 10*6/uL (ref 3.80–5.10)
RDW: 12.8 % (ref 11.0–15.0)
Total Lymphocyte: 21.7 %
WBC mixed population: 667 cells/uL (ref 200–950)
WBC: 11.5 10*3/uL — ABNORMAL HIGH (ref 3.8–10.8)

## 2017-10-29 LAB — TSH: TSH: 1.22 mIU/L

## 2017-10-29 LAB — COMPREHENSIVE METABOLIC PANEL
AG Ratio: 1.3 (calc) (ref 1.0–2.5)
ALT: 19 U/L (ref 6–29)
AST: 15 U/L (ref 10–30)
Albumin: 3.7 g/dL (ref 3.6–5.1)
Alkaline phosphatase (APISO): 107 U/L (ref 33–115)
BUN: 10 mg/dL (ref 7–25)
CO2: 24 mmol/L (ref 20–32)
Calcium: 9.1 mg/dL (ref 8.6–10.2)
Chloride: 105 mmol/L (ref 98–110)
Creat: 0.69 mg/dL (ref 0.50–1.10)
Globulin: 2.9 g/dL (calc) (ref 1.9–3.7)
Glucose, Bld: 120 mg/dL — ABNORMAL HIGH (ref 65–99)
Potassium: 4 mmol/L (ref 3.5–5.3)
Sodium: 136 mmol/L (ref 135–146)
Total Bilirubin: 0.5 mg/dL (ref 0.2–1.2)
Total Protein: 6.6 g/dL (ref 6.1–8.1)

## 2017-10-29 LAB — MAGNESIUM: Magnesium: 1.9 mg/dL (ref 1.5–2.5)

## 2017-10-29 LAB — LIPID PANEL
Cholesterol: 186 mg/dL (ref <200)
HDL: 30 mg/dL — ABNORMAL LOW (ref >50)
LDL Cholesterol (Calc): 122 mg/dL (calc) — ABNORMAL HIGH
Non-HDL Cholesterol (Calc): 156 mg/dL (calc) — ABNORMAL HIGH (ref <130)
Total CHOL/HDL Ratio: 6.2 (calc) — ABNORMAL HIGH (ref <5.0)
Triglycerides: 216 mg/dL — ABNORMAL HIGH (ref <150)

## 2017-10-29 LAB — VITAMIN B12: Vitamin B-12: 259 pg/mL (ref 200–1100)

## 2017-10-29 LAB — HEMOGLOBIN A1C
Hgb A1c MFr Bld: 6.6 % of total Hgb — ABNORMAL HIGH (ref <5.7)
Mean Plasma Glucose: 143 (calc)
eAG (mmol/L): 7.9 (calc)

## 2017-10-29 LAB — MICROALBUMIN / CREATININE URINE RATIO
Creatinine, Urine: 187 mg/dL (ref 20–275)
Microalb Creat Ratio: 47 mcg/mg creat — ABNORMAL HIGH (ref <30)
Microalb, Ur: 8.8 mg/dL

## 2017-10-29 LAB — VITAMIN D 25 HYDROXY (VIT D DEFICIENCY, FRACTURES): Vit D, 25-Hydroxy: 7 ng/mL — ABNORMAL LOW (ref 30–100)

## 2017-10-31 ENCOUNTER — Telehealth: Payer: Self-pay

## 2017-10-31 ENCOUNTER — Encounter: Payer: Self-pay | Admitting: Family Medicine

## 2017-10-31 MED ORDER — ROSUVASTATIN CALCIUM 40 MG PO TABS: 40.0000 mg | ORAL_TABLET | Freq: Every day | ORAL | 3 refills | Status: DC

## 2017-10-31 NOTE — Telephone Encounter (Signed)
I spoke  with pt, she will stop atorvastatin and start crestor 40 mg daily

## 2017-10-31 NOTE — Telephone Encounter (Signed)
-----   Message from Antoine Poche, MD sent at 10/31/2017 12:47 PM EST ----- Cholesterol too high, please stop atorvastatin and start crestor 40mg  daily   J BrancH MD

## 2017-11-08 ENCOUNTER — Ambulatory Visit (HOSPITAL_COMMUNITY)
Admission: RE | Admit: 2017-11-08 | Discharge: 2017-11-08 | Disposition: A | Discharge status: Home / Self Care | Payer: Medicaid Other | Source: Ambulatory Visit | Attending: Family Medicine | Admitting: Family Medicine

## 2017-11-08 DIAGNOSIS — R0600 Dyspnea, unspecified: Secondary | ICD-10-CM

## 2017-11-08 DIAGNOSIS — R0609 Other forms of dyspnea: Secondary | ICD-10-CM | POA: Diagnosis present

## 2017-11-08 LAB — PULMONARY FUNCTION TEST
DL/VA % pred: 62 %
DL/VA: 3.41 ml/min/mmHg/L
DLCO unc % pred: 73 %
DLCO unc: 23.75 ml/min/mmHg
FEF 25-75 Post: 4.31 L/sec
FEF 25-75 Pre: 3.61 L/sec
FEF2575-%Change-Post: 19 %
FEF2575-%Pred-Post: 121 %
FEF2575-%Pred-Pre: 102 %
FEV1-%Change-Post: 4 %
FEV1-%Pred-Post: 100 %
FEV1-%Pred-Pre: 95 %
FEV1-Post: 3.64 L
FEV1-Pre: 3.48 L
FEV1FVC-%Change-Post: 3 %
FEV1FVC-%Pred-Pre: 97 %
FEV6-%Change-Post: 0 %
FEV6-%Pred-Post: 98 %
FEV6-%Pred-Pre: 98 %
FEV6-Post: 4.35 L
FEV6-Pre: 4.31 L
FEV6FVC-%Pred-Post: 102 %
FEV6FVC-%Pred-Pre: 102 %
FVC-%Change-Post: 0 %
FVC-%Pred-Post: 97 %
FVC-%Pred-Pre: 96 %
FVC-Post: 4.35 L
FVC-Pre: 4.31 L
Post FEV1/FVC ratio: 84 %
Post FEV6/FVC ratio: 100 %
Pre FEV1/FVC ratio: 81 %
Pre FEV6/FVC Ratio: 100 %

## 2017-11-08 MED ORDER — ALBUTEROL SULFATE (2.5 MG/3ML) 0.083% IN NEBU
2.5000 mg | INHALATION_SOLUTION | Freq: Once | RESPIRATORY_TRACT | Status: AC
  Administered 2017-11-08: 2.5 mg via RESPIRATORY_TRACT

## 2017-11-09 ENCOUNTER — Telehealth: Payer: Self-pay | Admitting: Family Medicine

## 2017-11-09 ENCOUNTER — Encounter: Payer: Self-pay | Admitting: Family Medicine

## 2017-11-09 NOTE — Telephone Encounter (Signed)
Cb#: [phone]  Patient is requesting results from breathing test.

## 2017-11-10 NOTE — Telephone Encounter (Signed)
It was normal.  She was sent a letter

## 2017-11-11 NOTE — Telephone Encounter (Signed)
Called pt, aware. 

## 2017-12-02 ENCOUNTER — Ambulatory Visit: Payer: Medicaid Other | Admitting: Family Medicine

## 2017-12-05 ENCOUNTER — Encounter: Payer: Self-pay | Admitting: Family Medicine

## 2017-12-15 ENCOUNTER — Ambulatory Visit: Payer: Medicaid Other | Admitting: Family Medicine

## 2017-12-16 ENCOUNTER — Ambulatory Visit (INDEPENDENT_AMBULATORY_CARE_PROVIDER_SITE_OTHER): Payer: Medicaid Other | Admitting: Family Medicine

## 2017-12-16 ENCOUNTER — Encounter: Payer: Self-pay | Admitting: Family Medicine

## 2017-12-16 ENCOUNTER — Other Ambulatory Visit: Payer: Self-pay

## 2017-12-16 VITALS — BP 126/80 | HR 111 | Temp 97.6°F | Resp 16 | Ht 70.0 in | Wt 391.2 lb

## 2017-12-16 DIAGNOSIS — E559 Vitamin D deficiency, unspecified: Secondary | ICD-10-CM | POA: Diagnosis not present

## 2017-12-16 DIAGNOSIS — E1122 Type 2 diabetes mellitus with diabetic chronic kidney disease: Secondary | ICD-10-CM

## 2017-12-16 DIAGNOSIS — E282 Polycystic ovarian syndrome: Secondary | ICD-10-CM | POA: Diagnosis not present

## 2017-12-16 DIAGNOSIS — I10 Essential (primary) hypertension: Secondary | ICD-10-CM

## 2017-12-16 DIAGNOSIS — N182 Chronic kidney disease, stage 2 (mild): Secondary | ICD-10-CM

## 2017-12-16 MED ORDER — VITAMIN D (ERGOCALCIFEROL) 1.25 MG (50000 UNIT) PO CAPS: 50000.0000 [IU] | ORAL_CAPSULE | ORAL | 0 refills | Status: DC

## 2017-12-16 NOTE — Progress Notes (Signed)
Chief Complaint  Patient presents with  . Follow-up  Patient was a new patient last month.  She is here for follow-up. She previously complaint of dyspnea.  I did a pulmonary function test.  It was normal.  I explained to her her shortness of breath is likely due to her obesity and deconditioning. I discussed with her her lab test report.  She has a vitamin D of 7.  This is extremely low.  I am going to prescribe her vitamin D replacement 50,000 units a week for 12 weeks.  After that she needs to take an over-the-counter vitamin D 2000 units a day. Her LDL cholesterol is elevated at 122, HDL low at 30.  I started her on Crestor.  She has been taking it daily and tolerating it.  We will recheck her lipids in 3 months. Her diabetes was reasonably well controlled.  Her blood sugar, nonfasting, was 120 with a hemoglobin A1c of 6.6. The patient continues to smoke cigarettes.  She is unable and unwilling to quit at this time.  She is convinced that quitting cigarettes will increase her weight.  I explained to her that quitting cigarettes would be part of an overall plan to improve her health, and consider bariatric surgery. Her blood pressure is well controlled today. She has no new complaints today.  Patient Active Problem List   Diagnosis Date Noted  . Essential hypertension 10/28/2017  . History of chronic CHF 10/28/2017  . PCOS (polycystic ovarian syndrome) 10/28/2017  . Diabetic neuropathy, painful (HCC) 10/28/2017  . Chronic back pain 10/28/2017  . Coronary artery disease   . DM2 (diabetes mellitus, type 2) (HCC) 10/10/2011  . Acute anterior wall MI (HCC) 10/09/2011  . Morbid obesity (HCC) 10/09/2011  . Tobacco abuse 10/09/2011  . Hyperlipidemia 10/09/2011    Outpatient Encounter Medications as of 12/16/2017  Medication Sig  . aspirin 81 MG tablet Take 81 mg by mouth daily.  . carvedilol (COREG) 25 MG tablet TAKE 1 TABLET BY MOUTH TWICE DAILY WITH A MEAL  . ibuprofen  (ADVIL,MOTRIN) 200 MG tablet Take 400 mg by mouth every 8 (eight) hours as needed for headache.  . losartan (COZAAR) 25 MG tablet Take 0.5 tablets (12.5 mg total) by mouth daily.  . medroxyPROGESTERone (PROVERA) 5 MG tablet Take 5 mg by mouth daily.  . metFORMIN (GLUCOPHAGE) 1000 MG tablet Take 1 tablet (1,000 mg total) by mouth 2 (two) times daily.  . nitroGLYCERIN (NITROSTAT) 0.4 MG SL tablet Place 1 tablet (0.4 mg total) under the tongue every 5 (five) minutes as needed for chest pain.  . potassium chloride SA (K-DUR,KLOR-CON) 20 MEQ tablet Take 1 tablet (20 mEq total) by mouth daily.  . pregabalin (LYRICA) 75 MG capsule Take 1 capsule (75 mg total) by mouth 2 (two) times daily.  . rosuvastatin (CRESTOR) 40 MG tablet Take 1 tablet (40 mg total) by mouth daily.  Marland Kitchen torsemide (DEMADEX) 20 MG tablet Take 2 tablets (40 mg total) by mouth daily.  . Vitamin D, Ergocalciferol, (DRISDOL) 50000 units CAPS capsule Take 1 capsule (50,000 Units total) by mouth every 7 (seven) days.   No facility-administered encounter medications on file as of 12/16/2017.     Allergies  Allergen Reactions  . Bee Venom Anaphylaxis and Swelling  . Sulfa Drugs Cross Reactors Nausea And Vomiting    Review of Systems  Constitutional: Positive for unexpected weight change. Negative for activity change and appetite change.       Gain since  last visit  HENT: Negative for congestion, dental problem, postnasal drip and rhinorrhea.   Eyes: Negative for redness and visual disturbance.  Respiratory: Positive for shortness of breath. Negative for cough.        Normal PFT  Cardiovascular: Negative for chest pain, palpitations and leg swelling.  Gastrointestinal: Negative for abdominal pain, constipation and diarrhea.  Genitourinary: Positive for menstrual problem. Negative for difficulty urinating and frequency.       Irregular menses  Musculoskeletal: Positive for back pain. Negative for arthralgias.       Chronic back pain    Neurological: Negative for dizziness and headaches.  Psychiatric/Behavioral: Negative for dysphoric mood and sleep disturbance. The patient is not nervous/anxious.     BP 126/80 (BP Location: Right Arm, Patient Position: Sitting, Cuff Size: Large)   Pulse (!) 111   Temp 97.6 F (36.4 C) (Temporal)   Resp 16   Ht 5\' 10"  (1.778 m)   Wt (!) 391 lb 4 oz (177.5 kg)   SpO2 97%   BMI 56.14 kg/m   Physical Exam  Constitutional: She is oriented to person, place, and time. She appears well-developed and well-nourished.  Over nourished.  Morbidly obese.  Large abdominal pannus.  HENT:  Head: Normocephalic and atraumatic.  Mouth/Throat: Oropharynx is clear and moist.  Poor dentition, caries, fracture  Eyes: Pupils are equal, round, and reactive to light. Conjunctivae are normal.  Neck: Normal range of motion. No thyromegaly present.  Cardiovascular: Normal rate, regular rhythm and normal heart sounds.  Pulmonary/Chest: Effort normal.  Decreased breath sounds  Abdominal: Bowel sounds are normal. She exhibits distension.  Obese  Musculoskeletal: Normal range of motion. She exhibits edema.  1+ edema  Neurological: She is alert and oriented to person, place, and time.  Skin: Skin is warm and dry.  Hirsutism.  Facial hair, chest hair, sideburns  Psychiatric: She has a normal mood and affect. Her behavior is normal.    ASSESSMENT/PLAN:  1. PCOS (polycystic ovarian syndrome) Recommend follow-up with GYN  2. Essential hypertension Well-controlled  3. Type 2 diabetes mellitus with stage 2 chronic kidney disease, without long-term current use of insulin (HCC) Well-controlled.  Offered a consult with diabetes education for nutritional and diabetes counseling.  Refused  4. Vitamin D deficiency Discussed.  Prescribed high-dose replacement.   Patient Instructions  Call for GYN follow up  I am prescribing vitamin D once a week for 12 weeks  Due for eye exam  Last lab is in Feb, need  every 3 months This will be done at new office We will be happy to forward records       Eustace Moore, MD

## 2017-12-16 NOTE — Patient Instructions (Addendum)
Call for GYN follow up  I am prescribing vitamin D once a week for 12 weeks  Due for eye exam  Last lab is in Feb, need every 3 months This will be done at new office We will be happy to forward records

## 2018-01-15 ENCOUNTER — Other Ambulatory Visit: Payer: Self-pay | Admitting: Family Medicine

## 2018-01-17 ENCOUNTER — Telehealth: Payer: Self-pay

## 2018-01-17 NOTE — Telephone Encounter (Signed)
Called patient to let her know that her insurance would not cover Lyrica. Her alternative would be to take Gabapentin. Patient stated she had taken Gabapentin for 2 1/2 years and it didn't help. She didn't understand why [facility] medicaid covered the Lyrica and [place]'s medicaid won't. All the doctor had to do was fill out a paper. I explained to her that Dr.Nelson is leaving the practice and would not be her PCP after today and therefore  would not be completing the prior auth. The patient was upset stating she had asked about switching to Dr.Hagler and was told to wait until after her next appt, and when she came for her next appt, she was told Dr.Hagler had no availability. I explained to her I understood her frustration, however, I was new to the practice, and was coming in on the end of Dr.Nelson leaving. All I could do was offer her the alternative of Gabapentin per Dr.Nelson's instructions.

## 2018-01-24 ENCOUNTER — Telehealth: Payer: Self-pay

## 2018-01-24 NOTE — Progress Notes (Signed)
Subjective: DU:KGURKYHCW care, obesity HPI: [name] is a 37 y.o. female presenting to clinic today for:  1. Obesity/tobacco use disorder Patient reports that she has been struggling with obesity much of her life.  She notes that when she discontinued smoking about 2 years ago she had gained significant amounts of weight.  She reports her max weight over 500 pounds.  She has a history of CAD with MI at age 41, type 2 diabetes, sleep apnea.  She continues to smoke daily and reports that she has "tried everything under the sun".  She actually did discontinue smoking with Chantix for about 6 months but then resumed use because she had gained weight.  She is open to speaking with a bariatric surgeon, as this was suggested by her cardiologist Dr. Harl Bowie in the past as well.  2.  Family history of breast cancer Patient notes that her mother had a large breast cancer/tumor at age 39.  Patient notes that she has never been screened for breast cancer.  She denies any nipple discharge/bleeding, no breast changes, no masses.  She would like to have a screening mammogram if possible.  3.  Polycystic ovarian syndrome Patient reports that she has PCOS and dysfunctional uterine bleeding and is currently treated with progesterone daily for this so that " she does not have 4 periods a month".  She used to see an OB/GYN in [place].  She has not had a Pap smear in greater than 5 years.  She would like to reestablish with OB/GYN so that she can discuss consideration for hysterectomy.  She does not desire to have any more children.  Past Medical History:  Diagnosis Date  . Allergy   . Anemia   . Anxiety   . CHF (congestive heart failure) ([place])   . Chronic kidney disease    history of kidney stones  . Coronary artery disease 09/2011   a. Anterior MI. PCI and BMS to LAD and angioplsty to ostial Ramus;  b. 02/2012 Cath patent stent and ptca site, med rx.  . Depression   . Diabetes mellitus   .  Hyperlipidemia   . Hypertension   . Midsternal chest pain 03/03/2012  . Migraines   . Morbid obesity ([place])   . Myocardial infarct ([place]) 09/2011  . Myocardial infarction ([place]) 02/2016  . Palpitations 02/17/2012  . Sleep apnea   . Ureteral stone with hydronephrosis   . Urinary tract obstruction due to kidney stone 02/15/2017   Past Surgical History:  Procedure Laterality Date  . CARDIAC CATHETERIZATION  10/08/2011   LAD: 95% mid, Ramus: 95% ostial  . CARPAL TUNNEL RELEASE    . CESAREAN SECTION    . CHOLECYSTECTOMY    . CORONARY ANGIOPLASTY WITH STENT PLACEMENT  10/08/2011   LAD: BMS, Ramus: cutting balloon angioplasty  . CYSTOSCOPY WITH RETROGRADE PYELOGRAM, URETEROSCOPY AND STENT PLACEMENT Left 02/16/2017   Procedure: CYSTOSCOPY WITH RETROGRADE PYELOGRAM, URETEROSCOPY AND STENT PLACEMENT;  Surgeon: Kathie Rhodes, MD;  Location: [facility];  Service: Urology;  Laterality: Left;  . CYSTOSCOPY WITH RETROGRADE PYELOGRAM, URETEROSCOPY AND STENT PLACEMENT Left 03/28/2017   Procedure: LEFT STENT REMOVAL LEFT RETROGRADE PYELOGRAM LEFT URETEROSCOPY   LASER LITHOTRIPSY  AND  STENT REPLACEMENT;  Surgeon: Cleon Gustin, MD;  Location: [facility];  Service: Urology;  Laterality: Left;  . LEFT HEART CATHETERIZATION WITH CORONARY ANGIOGRAM N/A 10/08/2011   Procedure: LEFT HEART CATHETERIZATION WITH CORONARY ANGIOGRAM;  Surgeon: Peter M Martinique, MD;  Location: [facility] CATH LAB;  Service: Cardiovascular;  Laterality: N/A;  . LEFT HEART CATHETERIZATION WITH CORONARY ANGIOGRAM N/A 03/02/2012   Procedure: LEFT HEART CATHETERIZATION WITH CORONARY ANGIOGRAM;  Surgeon: Burnell Blanks, MD;  Location: [facility] CATH LAB;  Service: Cardiovascular;  Laterality: N/A;  . LEFT HEART CATHETERIZATION WITH CORONARY ANGIOGRAM N/A 01/04/2014   Procedure: LEFT HEART CATHETERIZATION WITH CORONARY ANGIOGRAM;  Surgeon: Troy Sine, MD;  Location: [facility] CATH LAB;  Service: Cardiovascular;  Laterality: N/A;   Social History   Socioeconomic  History  . Marital status: Married    Spouse name: Barnabas Lister  . Number of children: 2  . Years of education: 10  . Highest education level: Not on file  Occupational History  . Not on file  Social Needs  . Financial resource strain: Not hard at all  . Food insecurity:    Worry: Never true    Inability: Never true  . Transportation needs:    Medical: No    Non-medical: No  Tobacco Use  . Smoking status: Current Every Day Smoker    Packs/day: 0.50    Years: 20.00    Pack years: 10.00    Types: Cigarettes    Start date: 03/18/1990  . Smokeless tobacco: Never Used  Substance and Sexual Activity  . Alcohol use: Yes    Alcohol/week: 0.0 oz    Comment: maybe once a year  . Drug use: No  . Sexual activity: Yes    Birth control/protection: None    Comment: PCOS  Lifestyle  . Physical activity:    Days per week: 0 days    Minutes per session: 0 min  . Stress: To some extent  Relationships  . Social connections:    Talks on phone: More than three times a week    Gets together: More than three times a week    Attends religious service: Never    Active member of club or organization: No    Attends meetings of clubs or organizations: Never    Relationship status: Married  . Intimate partner violence:    Fear of current or ex partner: No    Emotionally abused: No    Physically abused: No    Forced sexual activity: No  Other Topics Concern  . Not on file  Social History Narrative   Unemployed   Applied for disability   Leisure "take care of house and kids"   Walks when able   Current Meds  Medication Sig  . aspirin 81 MG tablet Take 81 mg by mouth daily.  . carvedilol (COREG) 25 MG tablet TAKE 1 TABLET BY MOUTH TWICE DAILY WITH A MEAL  . ibuprofen (ADVIL,MOTRIN) 200 MG tablet Take 400 mg by mouth every 8 (eight) hours as needed for headache.  . losartan (COZAAR) 25 MG tablet Take 0.5 tablets (12.5 mg total) by mouth daily.  Marland Kitchen LYRICA 75 MG capsule TAKE ONE CAPSULE BY MOUTH  TWICE DAILY --PA NEEDED  . medroxyPROGESTERone (PROVERA) 5 MG tablet Take 5 mg by mouth daily.  . metFORMIN (GLUCOPHAGE) 1000 MG tablet Take 1 tablet (1,000 mg total) by mouth 2 (two) times daily.  . nitroGLYCERIN (NITROSTAT) 0.4 MG SL tablet Place 1 tablet (0.4 mg total) under the tongue every 5 (five) minutes as needed for chest pain.  . potassium chloride SA (K-DUR,KLOR-CON) 20 MEQ tablet Take 1 tablet (20 mEq total) by mouth daily.  . rosuvastatin (CRESTOR) 40 MG tablet Take 1 tablet (40 mg total) by mouth daily.  Marland Kitchen torsemide (DEMADEX) 20  MG tablet Take 2 tablets (40 mg total) by mouth daily.  . Vitamin D, Ergocalciferol, (DRISDOL) 50000 units CAPS capsule Take 1 capsule (50,000 Units total) by mouth every 7 (seven) days.   Family History  Problem Relation Age of Onset  . Heart murmur Father   . Diabetes Father   . Hypertension Father   . Hyperlipidemia Father   . Cancer Father        throat  . Diabetes Mother   . Cancer Mother        breast.uterine, ovarian  . Asthma Son   . Appendicitis Son   . Hernia Son   . Mental illness Son        Bipolar, personality d/o  . Stroke Maternal Grandmother   . Cancer Maternal Grandmother        breast  . Cancer Paternal Grandfather        lung   Allergies  Allergen Reactions  . Bee Venom Anaphylaxis and Swelling  . Sulfa Drugs Cross Reactors Nausea And Vomiting     Health Maintenance: TDap  ROS: Per HPI  Objective: Office vital signs reviewed. BP 121/82   Pulse (!) 101   Temp 98.5 F (36.9 C) (Oral)   Ht 5' 10"  (1.778 m)   Wt (!) 389 lb (176.4 kg)   BMI 55.82 kg/m   Physical Examination:  General: Awake, alert, morbidly obese, No acute distress HEENT: Normal, MMM, sclera white Cardio: regular rate and rhythm, S1S2 heard, no murmurs appreciated Pulm: clear to auscultation bilaterally, no wheezes, rhonchi or rales; normal work of breathing on room air Abdomen: obese Skin: several tattoos. No acanthosis nigricans  noted. Extremities: Warm, well-perfused, trace pedal edema.  No ulceration appreciated within the feet. Psych: Mood stable, speech normal, affect appropriate Depression screen [facility] 2/9 01/25/2018 10/28/2017  Decreased Interest 2 1  Down, Depressed, Hopeless 2 0  PHQ - 2 Score 4 1  Altered sleeping 2 -  Tired, decreased energy 2 -  Change in appetite 2 -  Feeling bad or failure about yourself  0 -  Trouble concentrating 2 -  Moving slowly or fidgety/restless 0 -  Suicidal thoughts 0 -  PHQ-9 Score 12 -  Difficult doing work/chores Somewhat difficult -    Assessment/ Plan: 37 y.o. female   1. Encounter to establish care with new doctor  2. Tobacco abuse Smoking cessation counseling performed.  Consider revisiting Chantix in the future should patient desire since she had success with stopping smoking.  She focuses primarily on the weight gain that occurred after discontinuation of tobacco use.  We discussed that there are options to reduce weight.  Patient is pre-contemplative at this point.  3. Morbid obesity ([place]) Patient is amenable to seeing a bariatric surgeon for discussion.  Per our discussion today, she has greatly reduce carbohydrate intake and essentially avoids all red meat.  She notes poor physical activity secondary to left lower extremity pain. - Amb Referral to Bariatric Surgery  4. Breast cancer screening Early family history in a first-degree relative of breast cancer.  Patient wishes to proceed with mammography.  Given age, we may not obtain a clear picture.  May need to consider BRCA testing  5. Need for Tdap vaccination Tdap administered.  6. Family history of breast cancer in mother See above.  7. History of MI (myocardial infarction) - Amb Referral to Bariatric Surgery  8. PCOS (polycystic ovarian syndrome) I referred to OB/GYN for consideration of hysterectomy per patient's request.  She  will need a Pap smear as well. - Ambulatory referral to Obstetrics /  Gynecology   Janora Norlander, [place] [phone]

## 2018-01-24 NOTE — Telephone Encounter (Signed)
Take 60mg  again on Wed and Thurs, update [place] again on Friday   Dominga Ferry MD

## 2018-01-24 NOTE — Telephone Encounter (Signed)
Pt notified. Voiced understanding.

## 2018-01-24 NOTE — Telephone Encounter (Signed)
Pt called to state that she is having a lot of swelling in her legs and abdomen. She has not weighed but her clothes are fitting tighter. She has taken an extra torsemide this morning (60 mg total) and she has not been able to go to bathroom yet. Please advise.

## 2018-01-25 ENCOUNTER — Ambulatory Visit: Payer: Medicaid Other | Admitting: Family Medicine

## 2018-01-25 ENCOUNTER — Encounter: Payer: Self-pay | Admitting: Family Medicine

## 2018-01-25 VITALS — BP 121/82 | HR 101 | Temp 98.5°F | Ht 70.0 in | Wt 389.0 lb

## 2018-01-25 DIAGNOSIS — I252 Old myocardial infarction: Secondary | ICD-10-CM

## 2018-01-25 DIAGNOSIS — Z7689 Persons encountering health services in other specified circumstances: Secondary | ICD-10-CM | POA: Diagnosis not present

## 2018-01-25 DIAGNOSIS — Z1239 Encounter for other screening for malignant neoplasm of breast: Secondary | ICD-10-CM

## 2018-01-25 DIAGNOSIS — Z1231 Encounter for screening mammogram for malignant neoplasm of breast: Secondary | ICD-10-CM

## 2018-01-25 DIAGNOSIS — Z803 Family history of malignant neoplasm of breast: Secondary | ICD-10-CM | POA: Diagnosis not present

## 2018-01-25 DIAGNOSIS — Z720 Tobacco use: Secondary | ICD-10-CM | POA: Diagnosis not present

## 2018-01-25 DIAGNOSIS — Z23 Encounter for immunization: Secondary | ICD-10-CM

## 2018-01-25 DIAGNOSIS — E282 Polycystic ovarian syndrome: Secondary | ICD-10-CM

## 2018-01-25 NOTE — Patient Instructions (Addendum)
You may schedule your mammogram when you check out today.  We offer mammogram on site 4 times per month. You are given your tetanus shot today.  This should cover you for 10 years unless you sustained a significant injury prior to that time.  I have referred you to OB/GYN and Bariatric surgery  Steps to Quit Smoking Smoking tobacco can be bad for your health. It can also affect almost every organ in your body. Smoking puts you and people around you at risk for many serious long-lasting (chronic) diseases. Quitting smoking is hard, but it is one of the best things that you can do for your health. It is never too late to quit. What are the benefits of quitting smoking? When you quit smoking, you lower your risk for getting serious diseases and conditions. They can include:  Lung cancer or lung disease.  Heart disease.  Stroke.  Heart attack.  Not being able to have children (infertility).  Weak bones (osteoporosis) and broken bones (fractures).  If you have coughing, wheezing, and shortness of breath, those symptoms may get better when you quit. You may also get sick less often. If you are pregnant, quitting smoking can help to lower your chances of having a baby of low birth weight. What can I do to help me quit smoking? Talk with your doctor about what can help you quit smoking. Some things you can do (strategies) include:  Quitting smoking totally, instead of slowly cutting back how much you smoke over a period of time.  Going to in-person counseling. You are more likely to quit if you go to many counseling sessions.  Using resources and support systems, such as: ? Agricultural engineer with a Veterinary surgeon. ? Phone quitlines. ? Automotive engineer. ? Support groups or group counseling. ? Text messaging programs. ? Mobile phone apps or applications.  Taking medicines. Some of these medicines may have nicotine in them. If you are pregnant or breastfeeding, do not take any medicines  to quit smoking unless your doctor says it is okay. Talk with your doctor about counseling or other things that can help you.  Talk with your doctor about using more than one strategy at the same time, such as taking medicines while you are also going to in-person counseling. This can help make quitting easier. What things can I do to make it easier to quit? Quitting smoking might feel very hard at first, but there is a lot that you can do to make it easier. Take these steps:  Talk to your family and friends. Ask them to support and encourage you.  Call phone quitlines, reach out to support groups, or work with a Veterinary surgeon.  Ask people who smoke to not smoke around you.  Avoid places that make you want (trigger) to smoke, such as: ? Bars. ? Parties. ? Smoke-break areas at work.  Spend time with people who do not smoke.  Lower the stress in your life. Stress can make you want to smoke. Try these things to help your stress: ? Getting regular exercise. ? Deep-breathing exercises. ? Yoga. ? Meditating. ? Doing a body scan. To do this, close your eyes, focus on one area of your body at a time from head to toe, and notice which parts of your body are tense. Try to relax the muscles in those areas.  Download or buy apps on your mobile phone or tablet that can help you stick to your quit plan. There are many free apps,  such as QuitGuide from the [employer] Office manager for Disease Control and Prevention). You can find more support from smokefree.gov and other websites.  This information is not intended to replace advice given to you by your health care provider. Make sure you discuss any questions you have with your health care provider. Document Released: 07/10/2009 Document Revised: 05/11/2016 Document Reviewed: 01/28/2015 Elsevier Interactive Patient Education  2018 [employer].

## 2018-01-27 ENCOUNTER — Other Ambulatory Visit (HOSPITAL_COMMUNITY)
Admission: AD | Admit: 2018-01-27 | Discharge: 2018-01-27 | Disposition: A | Discharge status: Home / Self Care | Payer: Medicaid Other | Source: Other Acute Inpatient Hospital | Attending: Urology | Admitting: Urology

## 2018-01-27 ENCOUNTER — Telehealth: Payer: Self-pay | Admitting: Cardiology

## 2018-01-27 ENCOUNTER — Ambulatory Visit: Payer: Medicaid Other | Admitting: Urology

## 2018-01-27 DIAGNOSIS — N390 Urinary tract infection, site not specified: Secondary | ICD-10-CM | POA: Insufficient documentation

## 2018-01-27 DIAGNOSIS — R109 Unspecified abdominal pain: Secondary | ICD-10-CM | POA: Diagnosis not present

## 2018-01-27 LAB — URINALYSIS, COMPLETE (UACMP) WITH MICROSCOPIC
Bilirubin Urine: NEGATIVE
Glucose, UA: NEGATIVE mg/dL
Ketones, ur: NEGATIVE mg/dL
Nitrite: POSITIVE — AB
Protein, ur: NEGATIVE mg/dL
Specific Gravity, Urine: 1.015 (ref 1.005–1.030)
WBC, UA: >50 WBC/hpf — ABNORMAL HIGH (ref 0–5)
pH: 5 (ref 5.0–8.0)

## 2018-01-27 NOTE — Telephone Encounter (Signed)
In regards to conversation with Misty Stanley yesterday. / t g

## 2018-01-27 NOTE — Telephone Encounter (Signed)
I spoke with  Patient, she will increase torsemide as directed and call us back on Monday

## 2018-01-27 NOTE — Telephone Encounter (Signed)
Per family , no change in urine output, cannot get hold of urologist, does not have scale.

## 2018-01-27 NOTE — Telephone Encounter (Signed)
Take toresmide 40mg  bid today, Sat, Sun and update [place] Monday.  Can we get her a PA appt next week.   Dominga Ferry MD

## 2018-01-30 LAB — URINE CULTURE: Culture: >=100000 — AB

## 2018-02-14 DIAGNOSIS — H5213 Myopia, bilateral: Secondary | ICD-10-CM | POA: Diagnosis not present

## 2018-02-14 DIAGNOSIS — H52223 Regular astigmatism, bilateral: Secondary | ICD-10-CM | POA: Diagnosis not present

## 2018-02-14 LAB — HM DIABETES EYE EXAM

## 2018-02-27 ENCOUNTER — Encounter: Payer: Medicaid Other | Admitting: Obstetrics and Gynecology

## 2018-03-10 ENCOUNTER — Encounter: Payer: Self-pay | Admitting: Cardiology

## 2018-03-13 ENCOUNTER — Encounter: Payer: Medicaid Other | Admitting: Obstetrics and Gynecology

## 2018-03-15 ENCOUNTER — Ambulatory Visit: Payer: Medicaid Other | Admitting: Urology

## 2018-04-05 ENCOUNTER — Encounter: Payer: Medicaid Other | Admitting: Obstetrics and Gynecology

## 2018-04-13 ENCOUNTER — Encounter: Payer: Self-pay | Admitting: Cardiology

## 2018-04-21 ENCOUNTER — Ambulatory Visit: Payer: Medicaid Other | Admitting: Cardiology

## 2018-04-24 ENCOUNTER — Encounter: Payer: Medicaid Other | Admitting: Obstetrics and Gynecology

## 2018-04-28 ENCOUNTER — Ambulatory Visit: Payer: Medicaid Other | Admitting: Family Medicine

## 2018-05-05 ENCOUNTER — Other Ambulatory Visit: Payer: Self-pay | Admitting: Family Medicine

## 2018-05-05 ENCOUNTER — Other Ambulatory Visit: Payer: Self-pay

## 2018-05-05 DIAGNOSIS — Z1239 Encounter for other screening for malignant neoplasm of breast: Secondary | ICD-10-CM

## 2018-05-10 ENCOUNTER — Ambulatory Visit: Payer: Medicaid Other | Admitting: Obstetrics and Gynecology

## 2018-05-10 ENCOUNTER — Other Ambulatory Visit (HOSPITAL_COMMUNITY)
Admission: RE | Admit: 2018-05-10 | Discharge: 2018-05-10 | Disposition: A | Discharge status: Home / Self Care | Payer: Medicaid Other | Source: Ambulatory Visit | Attending: Obstetrics and Gynecology | Admitting: Obstetrics and Gynecology

## 2018-05-10 ENCOUNTER — Ambulatory Visit (INDEPENDENT_AMBULATORY_CARE_PROVIDER_SITE_OTHER): Payer: Medicaid Other | Admitting: Cardiology

## 2018-05-10 ENCOUNTER — Encounter: Payer: Self-pay | Admitting: Obstetrics and Gynecology

## 2018-05-10 ENCOUNTER — Encounter: Payer: Self-pay | Admitting: Cardiology

## 2018-05-10 VITALS — BP 127/86 | HR 90 | Ht 70.0 in | Wt >= 6400 oz

## 2018-05-10 VITALS — BP 117/81 | HR 87 | Ht 70.0 in | Wt >= 6400 oz

## 2018-05-10 DIAGNOSIS — Z0001 Encounter for general adult medical examination with abnormal findings: Secondary | ICD-10-CM

## 2018-05-10 DIAGNOSIS — Z124 Encounter for screening for malignant neoplasm of cervix: Secondary | ICD-10-CM

## 2018-05-10 DIAGNOSIS — I10 Essential (primary) hypertension: Secondary | ICD-10-CM

## 2018-05-10 DIAGNOSIS — E782 Mixed hyperlipidemia: Secondary | ICD-10-CM

## 2018-05-10 DIAGNOSIS — I2510 Atherosclerotic heart disease of native coronary artery without angina pectoris: Secondary | ICD-10-CM | POA: Diagnosis not present

## 2018-05-10 DIAGNOSIS — N939 Abnormal uterine and vaginal bleeding, unspecified: Secondary | ICD-10-CM | POA: Diagnosis not present

## 2018-05-10 DIAGNOSIS — I5033 Acute on chronic diastolic (congestive) heart failure: Secondary | ICD-10-CM | POA: Diagnosis not present

## 2018-05-10 DIAGNOSIS — E282 Polycystic ovarian syndrome: Secondary | ICD-10-CM

## 2018-05-10 DIAGNOSIS — N898 Other specified noninflammatory disorders of vagina: Secondary | ICD-10-CM

## 2018-05-10 LAB — POCT WET PREP (WET MOUNT): Trichomonas Wet Prep HPF POC: ABSENT

## 2018-05-10 MED ORDER — TORSEMIDE 20 MG PO TABS: 60.0000 mg | ORAL_TABLET | Freq: Two times a day (BID) | ORAL | 3 refills | Status: DC

## 2018-05-10 NOTE — Patient Instructions (Signed)
Your physician recommends that you schedule a follow-up appointment in: 3-4 WEEKS WITH AN EXTENDER IN [place] OFFICE   Your physician has recommended you make the following change in your medication:   INCREASE TORSEMIDE 60 MG TWICE DAILY  Your physician recommends that you return for lab work in: 1 WEEK BMP/MG/BNP  Your physician recommends that you weigh, daily, at the same time every day, and in the same amount of clothing. Please record your daily weights for 1 WEEK AND CALL us.  Thank you for choosing [facility]!!

## 2018-05-10 NOTE — Progress Notes (Signed)
Informed pts husband of U/S appt at [facility] on 8/19 @ 230. Advised to arrive at 215 with a full bladder. Advised to call them for appt changes.

## 2018-05-10 NOTE — Progress Notes (Signed)
Clinical Summary [name] is a 37 y.o.female seen today for follow up of the following medical problems.   1. CAD - prior anterior MI 09/2011, 95% lesion in mid LAD and ramus with 95% ostial stenosis. LVEF 55% by LV gram, apical hypokinesis. BMS to mid LAD and cutting balloon angioplasty of ramus.  - repeat cath 02/2012 with patent vessels and stent - 09/2011 Echo: LVEF 50% - 12/2013 Lexiscan large anteroseptal ischemia - cath 12/2013 with patent vessels - cath 02/2016 [facility] in setting of NSTEMI showed LM patent, LAD patent with patent stent, distal LAD 30-40%, LCX patent, RCA 30-40%. LVEDP 40, PCWP 30 mean PA 32, CI 4.93 - echo 02/2016 [place] LVEF 50%, apical akinesis   - no recent chest pain.     2. Chronic diastolic HF - edema overall controlled with diuretics.   - takes toresmide 40mg  daily. Some days takes bid which increases. Reported 20 lbs weight gain in 2 weeks. Increased SOB. Example DOE with walking at grocery store.    3. HTN - she is compliant with meds  4. Hyperlipidemia -she is compliant with statin. Marland Kitchen   5. Kidney stones - upcoming surgery   6. Obesity - considering bariatric intervention Past Medical History:  Diagnosis Date  . Allergy   . Anemia   . Anxiety   . CHF (congestive heart failure) (HCC)   . Chronic kidney disease    history of kidney stones  . Coronary artery disease 09/2011   a. Anterior MI. PCI and BMS to LAD and angioplsty to ostial Ramus;  b. 02/2012 Cath patent stent and ptca site, med rx.  . Depression   . Diabetes mellitus   . Hyperlipidemia   . Hypertension   . Midsternal chest pain 03/03/2012  . Migraines   . Morbid obesity (HCC)   . Myocardial infarct (HCC) 09/2011  . Myocardial infarction (HCC) 02/2016  . Palpitations 02/17/2012  . Sleep apnea   . Ureteral stone with hydronephrosis   . Urinary tract obstruction due to kidney stone 02/15/2017     Allergies  Allergen Reactions  . Bee Venom Anaphylaxis and  Swelling  . Sulfa Drugs Cross Reactors Nausea And Vomiting     Current Outpatient Medications  Medication Sig Dispense Refill  . aspirin 81 MG tablet Take 81 mg by mouth daily.    . carvedilol (COREG) 25 MG tablet TAKE 1 TABLET BY MOUTH TWICE DAILY WITH A MEAL 60 tablet 6  . ibuprofen (ADVIL,MOTRIN) 200 MG tablet Take 400 mg by mouth every 8 (eight) hours as needed for headache.    . losartan (COZAAR) 25 MG tablet Take 0.5 tablets (12.5 mg total) by mouth daily. 45 tablet 3  . LYRICA 75 MG capsule TAKE ONE CAPSULE BY MOUTH TWICE DAILY --PA NEEDED 60 capsule 0  . medroxyPROGESTERone (PROVERA) 5 MG tablet Take 5 mg by mouth daily.    . metFORMIN (GLUCOPHAGE) 1000 MG tablet Take 1 tablet (1,000 mg total) by mouth 2 (two) times daily. 60 tablet 1  . nitroGLYCERIN (NITROSTAT) 0.4 MG SL tablet Place 1 tablet (0.4 mg total) under the tongue every 5 (five) minutes as needed for chest pain. 25 tablet 3  . potassium chloride SA (K-DUR,KLOR-CON) 20 MEQ tablet Take 1 tablet (20 mEq total) by mouth daily. 90 tablet 3  . rosuvastatin (CRESTOR) 40 MG tablet Take 1 tablet (40 mg total) by mouth daily. 90 tablet 3  . torsemide (DEMADEX) 20 MG tablet Take 2 tablets (40  mg total) by mouth daily. 60 tablet 6  . Vitamin D, Ergocalciferol, (DRISDOL) 50000 units CAPS capsule Take 1 capsule (50,000 Units total) by mouth every 7 (seven) days. 12 capsule 0   No current facility-administered medications for this visit.      Past Surgical History:  Procedure Laterality Date  . CARDIAC CATHETERIZATION  10/08/2011   LAD: 95% mid, Ramus: 95% ostial  . CARPAL TUNNEL RELEASE    . CESAREAN SECTION    . CHOLECYSTECTOMY    . CORONARY ANGIOPLASTY WITH STENT PLACEMENT  10/08/2011   LAD: BMS, Ramus: cutting balloon angioplasty  . CYSTOSCOPY WITH RETROGRADE PYELOGRAM, URETEROSCOPY AND STENT PLACEMENT Left 02/16/2017   Procedure: CYSTOSCOPY WITH RETROGRADE PYELOGRAM, URETEROSCOPY AND STENT PLACEMENT;  Surgeon: Ihor Gully,  MD;  Location: [facility];  Service: Urology;  Laterality: Left;  . CYSTOSCOPY WITH RETROGRADE PYELOGRAM, URETEROSCOPY AND STENT PLACEMENT Left 03/28/2017   Procedure: LEFT STENT REMOVAL LEFT RETROGRADE PYELOGRAM LEFT URETEROSCOPY   LASER LITHOTRIPSY  AND  STENT REPLACEMENT;  Surgeon: Malen Gauze, MD;  Location: [facility];  Service: Urology;  Laterality: Left;  . LEFT HEART CATHETERIZATION WITH CORONARY ANGIOGRAM N/A 10/08/2011   Procedure: LEFT HEART CATHETERIZATION WITH CORONARY ANGIOGRAM;  Surgeon: Peter M Swaziland, MD;  Location: [facility] CATH LAB;  Service: Cardiovascular;  Laterality: N/A;  . LEFT HEART CATHETERIZATION WITH CORONARY ANGIOGRAM N/A 03/02/2012   Procedure: LEFT HEART CATHETERIZATION WITH CORONARY ANGIOGRAM;  Surgeon: Kathleene Hazel, MD;  Location: [facility] CATH LAB;  Service: Cardiovascular;  Laterality: N/A;  . LEFT HEART CATHETERIZATION WITH CORONARY ANGIOGRAM N/A 01/04/2014   Procedure: LEFT HEART CATHETERIZATION WITH CORONARY ANGIOGRAM;  Surgeon: Lennette Bihari, MD;  Location: [facility] CATH LAB;  Service: Cardiovascular;  Laterality: N/A;     Allergies  Allergen Reactions  . Bee Venom Anaphylaxis and Swelling  . Sulfa Drugs Cross Reactors Nausea And Vomiting      Family History  Problem Relation Age of Onset  . Heart murmur Father   . Diabetes Father   . Hypertension Father   . Hyperlipidemia Father   . Cancer Father        throat  . Diabetes Mother   . Cancer Mother        breast.uterine, ovarian  . Asthma Son   . Appendicitis Son   . Hernia Son   . Mental illness Son        Bipolar, personality d/o  . Stroke Maternal Grandmother   . Cancer Maternal Grandmother        breast  . Cancer Paternal Grandfather        lung     Social History [name] reports that she has been smoking cigarettes. She started smoking about 28 years ago. She has a 10.00 pack-year smoking history. She has never used smokeless tobacco. [name] reports that she drinks alcohol.   Review  of Systems CONSTITUTIONAL: No weight loss, fever, chills, weakness or fatigue.  HEENT: Eyes: No visual loss, blurred vision, double vision or yellow sclerae.No hearing loss, sneezing, congestion, runny nose or sore throat.  SKIN: No rash or itching.  CARDIOVASCULAR: per hpi RESPIRATORY: per hpi GASTROINTESTINAL: No anorexia, nausea, vomiting or diarrhea. No abdominal pain or blood.  GENITOURINARY: No burning on urination, no polyuria NEUROLOGICAL: No headache, dizziness, syncope, paralysis, ataxia, numbness or tingling in the extremities. No change in bowel or bladder control.  MUSCULOSKELETAL: No muscle, back pain, joint pain or stiffness.  LYMPHATICS: No enlarged nodes. No history of splenectomy.  PSYCHIATRIC: No history of depression or anxiety.  ENDOCRINOLOGIC: No reports of sweating, cold or heat intolerance. No polyuria or polydipsia.  Marland Kitchen   Physical Examination Vitals:   05/10/18 1512  BP: 117/81  Pulse: 87  SpO2: 97%   Vitals:   05/10/18 1512  Weight: (!) 401 lb 12.8 oz (182.3 kg)  Height: 5\' 10"  (1.778 m)    Gen: resting comfortably, no acute distress HEENT: no scleral icterus, pupils equal round and reactive, no palptable cervical adenopathy,  CV: RRR, no m/r/g, no jvd Resp: Clear to auscultation bilaterally GI: abdomen is soft, non-tender, non-distended, normal bowel sounds, no hepatosplenomegaly MSK: extremities are warm, 1+ bialteral LE edema Skin: warm, no rash Neuro:  no focal deficits Psych: appropriate affect   Diagnostic Studies 02/2012 Cath Hemodynamic Findings: Central aortic pressure: 108/70  Left ventricular pressure: 123/10/21  Angiographic Findings: Left main: No obstructive disease noted.  Left Anterior Descending Artery: Large caliber vessel that courses to the apex. There is a stent present in the mid vessel that is widely patent with no restenosis. The remainder of the LAD is disease free. There is a moderate sized diagonal  with  30% proximal stenosis.  Circumflex Artery: Dominant, large caliber vessel with no disease throughout the AV groove segment. There is small to moderate sized intermediate  that has ostial 30% stenosis. The remainder of the Circumflex has no obstructive disease.  Right Coronary Artery: Small, non-dominant vessel. No disease noted.  Left Ventricular Angiogram: LVEF 50-55%.  Impression:  1. Double vessel CAD with patent stent mid LAD and patent angioplasty site at ostium of intermediate .  2. Normal LV systolic function   09/2011 Cath PROCEDURAL FINDINGS  Hemodynamics:  AO 135/92 with a mean of 114 mmHg  LV 136/25 mmHg  Coronary angiography:  Coronary dominance: Left  Left mainstem: Normal.  Left anterior descending (LAD): There is a 95% stenosis in the mid LAD immediately after the takeoff of the first diagonal. The first diagonal  is moderate in size and appears normal. The LAD stenosis does appear to be hazy.  There is a moderately large ramus intermediate  which has a 95% ostial stenosis.  Left circumflex (LCx): This is a dominant vessel and appears normal throughout.  Right coronary artery (RCA): This is a small nondominant vessel and is normal.  Left ventriculography: Left ventricular systolic function is abnormal with apical hypokinesis. LVEF is estimated at 55%, there is no significant mitral regurgitation  PCI Note: Following the diagnostic procedure, the decision was made to proceed with PCI of the LAD. Weight-based bivalirudin was given for anticoagulation. Once a therapeutic ACT was achieved, a 5 [place] EBU guide catheter was inserted. A pro-water coronary guidewire was used to cross the lesion. The lesion was predilated with a 2.5 mm balloon. The lesion was then stented with a 3.0 x 18 mm vision stent. The stent was postdilated with a 3.25 mm noncompliant balloon. Following PCI, there was 0% residual stenosis and TIMI-3 flow. At this point the  patient's pain was improved but was [place] of moderate intensity. We then proceeded to intervene on the ostial ramus intermediate lesion. This was crossed with the prowater wire. This was dilated using a 3.0 x 10 mm cutting balloon performing 3 inflations to 6 atmospheres. This showed a good angiographic result with less than 30% residual stenosis and TIMI grade 3 flow. Final angiography confirmed an excellent result. The patient tolerated the procedure well. There were no immediate procedural complications. A TR band was  used for radial hemostasis. The patient was transferred to the post catheterization recovery area for further monitoring.  PCI Data:  Vessel - LAD/Segment - mid vessel immediately after the takeoff of the first diagonal.  Percent Stenosis (pre) 95%  TIMI-flow 3.  Stent 3.0 x 18 mm vision stent.  Percent Stenosis (post) 0%  TIMI-flow (post) 3.  Second vessel: Ostial ramus intermediate   Percent stenosis: 95%.  TIMI flow 3.  3.0 mm cutting balloon  Percent stenosis (post) less than 30%  TIMI flow 3  Final Conclusions:  1. Severe 2 vessel obstructive coronary disease.  2. Overall well preserved LV systolic function with apical wall motion abnormality and ejection fraction of 55%.  3. Successful intracoronary stenting of the mid LAD with a bare-metal stent.  4. Successful cutting balloon angioplasty of the ramus intermediate .  Recommendations:  Continue therapy with Plavix. Patient reports a history of allergy to aspirin. Aggressive risk factor modification.  Disposition: Patient be observed in the intensive care unit tonight. If she has no complications she should be able to be transferred to telemetry in the morning.   12/2013 Cath HEMODYNAMICS:  Central Aorta: 100/60  Left Ventricle: 100/3  ANGIOGRAPHY:  1. Left main: Normal and trifurcated into an LAD, ramus intermediate vessel, and a dominant left circumflex coronary artery   2. LAD: Barbera Setters gave rise to a proximal large first diagonal vessel. The LAD just after the diagonal vessel had a widely patent stent. The remainder of the LAD was free of significant disease and extended to the LV apex. 3. Ramus Intermediate: No evidence for restenosis at previous site of ostial cutting balloon intervention. 4. Left circumflex: Angiographically normal. Dominant vessel, which gave rise to 2 marginal es and in the PDA.Marland Kitchen  4. Right coronary artery: Angiographically normal, nondominant vessel  Left ventriculography revealed global LV function. There was a suggestion of mild residual distal anterolateral hypocontractility. There was catheter-induced mitral regurgitation.  IMPRESSION:  Normal LV function with mild distal anterolateral, residual hypocontractility  No significant residual coronary obstructive disease with evidence for widely patent LAD stent, no evidence for restenosis of the ramus intermediate vessel, normal dominant left circumflex coronary artery and normal nondominant right coronary artery.    12/2013 MPI IMPRESSION: 1. Abnormal Lexiscan for ischemia  2. Large anteroseptal and apical area of ischemia  3. High risk study for major cardiac events based on area of myocardium at [facility]  4. Normal left ventricular systolic function, left ventricular ejection fraction 52%     Assessment and Plan  1. CAD -no chest pain symptoms, recent SOB due to acute on chronic diastolic HF as opposed to CAD - continue current meds   2. Acute on chronic diastolic HF - recent weight gain, edema, SOB - not repsonding to torsemide 40mg  daily or bid - increase torsemide to 60mg  bid. CHeck BMET/Mg/BNP in 1 weeks.  - she is to call and update weights in 1 week - f/u in 3-4 weeks.    3. HTN - she is at goal, continue current meds  4. Hyperlipidemia - continue statin     Antoine Poche, M.D.

## 2018-05-10 NOTE — Progress Notes (Addendum)
Patient ID: [name], female   DOB: 06-21-1981, 37 y.o.   MRN: [id number] Referral from Dr. [facility],  Assessment:  Annual Gyn Exam hx PCOS AUB with hx fibroid uterus   Plan:  1. pap smear done, next pap due 3 years 2. Transvaginal u/s  3. ConsideRx provera once a dayr IUD after u/s, may need endometrial biopsy pre-IUD. 4.   Continue Rx provera once a day by Dr Ralph Dowdy. Subjective:  [name] is a 37 y.o. female G2P0 who presents for annual exam. Patient's last menstrual period was 05/02/2018. The patient has complaints today of   She has PCOS with fibroids. She is diabetic, is in heart failure and is currently a smoker. GYN Hx.She has periods 3 times a month and says that " she has blood clots from the size of golf balls to the size of baseballs". Has not had a ultrasound in almost 5 years since last pap. Pt lost significant amount of weight after having a heart attack. She got pregnant with both of her children while on birth control including Depo and pill. Pt broke her pelvis at age 19 and her son broke her pelvis again after failed induction of 70 hours of labor.  The following portions of the patient's history were reviewed and updated as appropriate: allergies, current medications, past family history, past medical history, past social history, past surgical history and problem list. Past Medical History:  Diagnosis Date  . Allergy   . Anemia   . Anxiety   . CHF (congestive heart failure) (HCC)   . Chronic kidney disease    history of kidney stones  . Coronary artery disease 09/2011   a. Anterior MI. PCI and BMS to LAD and angioplsty to ostial Ramus;  b. 02/2012 Cath patent stent and ptca site, med rx.  . Depression   . Diabetes mellitus   . Hyperlipidemia   . Hypertension   . Midsternal chest pain 03/03/2012  . Migraines   . Morbid obesity (HCC)   . Myocardial infarct (HCC) 09/2011  . Myocardial infarction (HCC) 02/2016  . Palpitations  02/17/2012  . Sleep apnea   . Ureteral stone with hydronephrosis   . Urinary tract obstruction due to kidney stone 02/15/2017    Past Surgical History:  Procedure Laterality Date  . CARDIAC CATHETERIZATION  10/08/2011   LAD: 95% mid, Ramus: 95% ostial  . CARPAL TUNNEL RELEASE    . CESAREAN SECTION    . CHOLECYSTECTOMY    . CORONARY ANGIOPLASTY WITH STENT PLACEMENT  10/08/2011   LAD: BMS, Ramus: cutting balloon angioplasty  . CYSTOSCOPY WITH RETROGRADE PYELOGRAM, URETEROSCOPY AND STENT PLACEMENT Left 02/16/2017   Procedure: CYSTOSCOPY WITH RETROGRADE PYELOGRAM, URETEROSCOPY AND STENT PLACEMENT;  Surgeon: Ihor Gully, MD;  Location: [facility];  Service: Urology;  Laterality: Left;  . CYSTOSCOPY WITH RETROGRADE PYELOGRAM, URETEROSCOPY AND STENT PLACEMENT Left 03/28/2017   Procedure: LEFT STENT REMOVAL LEFT RETROGRADE PYELOGRAM LEFT URETEROSCOPY   LASER LITHOTRIPSY  AND  STENT REPLACEMENT;  Surgeon: Malen Gauze, MD;  Location: [facility];  Service: Urology;  Laterality: Left;  . LEFT HEART CATHETERIZATION WITH CORONARY ANGIOGRAM N/A 10/08/2011   Procedure: LEFT HEART CATHETERIZATION WITH CORONARY ANGIOGRAM;  Surgeon: Peter M Swaziland, MD;  Location: [facility] CATH LAB;  Service: Cardiovascular;  Laterality: N/A;  . LEFT HEART CATHETERIZATION WITH CORONARY ANGIOGRAM N/A 03/02/2012   Procedure: LEFT HEART CATHETERIZATION WITH CORONARY ANGIOGRAM;  Surgeon: Kathleene Hazel, MD;  Location: [facility] CATH LAB;  Service:  Cardiovascular;  Laterality: N/A;  . LEFT HEART CATHETERIZATION WITH CORONARY ANGIOGRAM N/A 01/04/2014   Procedure: LEFT HEART CATHETERIZATION WITH CORONARY ANGIOGRAM;  Surgeon: Lennette Bihari, MD;  Location: [facility] CATH LAB;  Service: Cardiovascular;  Laterality: N/A;     Current Outpatient Medications:  .  aspirin 81 MG tablet, Take 81 mg by mouth daily., Disp: , Rfl:  .  carvedilol (COREG) 25 MG tablet, TAKE 1 TABLET BY MOUTH TWICE DAILY WITH A MEAL, Disp: 60 tablet, Rfl: 6 .  ibuprofen  (ADVIL,MOTRIN) 200 MG tablet, Take 400 mg by mouth every 8 (eight) hours as needed for headache., Disp: , Rfl:  .  losartan (COZAAR) 25 MG tablet, Take 0.5 tablets (12.5 mg total) by mouth daily., Disp: 45 tablet, Rfl: 3 .  LYRICA 75 MG capsule, TAKE ONE CAPSULE BY MOUTH TWICE DAILY --PA NEEDED, Disp: 60 capsule, Rfl: 0 .  medroxyPROGESTERone (PROVERA) 5 MG tablet, Take 5 mg by mouth daily., Disp: , Rfl:  .  metFORMIN (GLUCOPHAGE) 1000 MG tablet, Take 1 tablet (1,000 mg total) by mouth 2 (two) times daily., Disp: 60 tablet, Rfl: 1 .  potassium chloride SA (K-DUR,KLOR-CON) 20 MEQ tablet, Take 1 tablet (20 mEq total) by mouth daily., Disp: 90 tablet, Rfl: 3 .  torsemide (DEMADEX) 20 MG tablet, Take 2 tablets (40 mg total) by mouth daily., Disp: 60 tablet, Rfl: 6 .  nitroGLYCERIN (NITROSTAT) 0.4 MG SL tablet, Place 1 tablet (0.4 mg total) under the tongue every 5 (five) minutes as needed for chest pain. (Patient not taking: Reported on 05/10/2018), Disp: 25 tablet, Rfl: 3 .  rosuvastatin (CRESTOR) 40 MG tablet, Take 1 tablet (40 mg total) by mouth daily., Disp: 90 tablet, Rfl: 3 .  Vitamin D, Ergocalciferol, (DRISDOL) 50000 units CAPS capsule, Take 1 capsule (50,000 Units total) by mouth every 7 (seven) days. (Patient not taking: Reported on 05/10/2018), Disp: 12 capsule, Rfl: 0  Review of Systems Constitutional: negative Gastrointestinal: negative Genitourinary: normal  Objective:  BP 127/86 (BP Location: Right Arm, Patient Position: Sitting, Cuff Size: Large)   Pulse 90   Ht 5\' 10"  (1.778 m)   Wt (!) 401 lb 9.6 oz (182.2 kg)   LMP 05/02/2018   BMI 57.62 kg/m    BMI: Body mass index is 57.62 kg/m.  General Appearance: Alert, appropriate appearance for age. No acute distress HEENT: Grossly normal Neck / Thyroid:  Cardiovascular: RRR; normal S1, S2, no murmur Lungs: CTA bilaterally Back: No CVAT Gastrointestinal: Soft, non-tender, no masses or organomegaly Pelvic Exam: Large snowman  speculum used,  VAGINA: heavy normal watery discharge CERVIX:tiny no prior vaginal deliveries. UTERUS:Could not exam due to body inhabitus  ADNEXA:unable to assess due to body weight WET MOUNT: neg for trick and whiff, neg yeast,   PAP: Pap smear done today. Lymphatic Exam: Non-palpable nodes in neck, clavicular, axillary, or inguinal regions Skin: no rash or abnormalities Neurologic: Normal gait and speech, no tremor  Psychiatric: Alert and oriented, appropriate affect. Urinalysis:Not done       By signing my name below, I, Arnette Norris, attest that this documentation has been prepared under the direction and in the presence of Tilda Burrow, MD. Electronically Signed: Arnette Norris Medical Scribe. 05/10/18. 1:52 PM.  I personally performed the services described in this documentation, which was SCRIBED in my presence. The recorded information has been reviewed and considered accurate. It has been edited as necessary during review. Tilda Burrow, MD

## 2018-05-12 LAB — CYTOLOGY - PAP
Adequacy: ABSENT
Diagnosis: NEGATIVE
HPV: NOT DETECTED

## 2018-05-15 ENCOUNTER — Ambulatory Visit (HOSPITAL_COMMUNITY): Admission: RE | Admit: 2018-05-15 | Payer: Medicaid Other | Source: Ambulatory Visit

## 2018-05-15 ENCOUNTER — Telehealth: Payer: Self-pay

## 2018-05-15 NOTE — Telephone Encounter (Signed)
Returned call, advised Mr. Coger on medication adjustments. He voiced understanding. Stated pt will get labs done tomorrow.

## 2018-05-15 NOTE — Telephone Encounter (Signed)
Pt's husband called. He stated that his wife is having severe dizziness, SOB, fatigue and nausea on the increased dose of torsemide. (60 mg BID) She has already dropped 11 lbs of fluid. Please advise.

## 2018-05-15 NOTE — Telephone Encounter (Signed)
Hold fluid pills today and tomorrow, can she go ahead and get her blood work done today or tomorrow   Dominga Ferry MD

## 2018-05-24 ENCOUNTER — Ambulatory Visit: Payer: Medicaid Other | Admitting: Family Medicine

## 2018-05-30 ENCOUNTER — Ambulatory Visit (HOSPITAL_COMMUNITY): Payer: Medicaid Other

## 2018-06-05 ENCOUNTER — Other Ambulatory Visit: Payer: Self-pay | Admitting: Cardiology

## 2018-06-05 DIAGNOSIS — I2510 Atherosclerotic heart disease of native coronary artery without angina pectoris: Secondary | ICD-10-CM | POA: Diagnosis not present

## 2018-06-06 LAB — BRAIN NATRIURETIC PEPTIDE: Brain Natriuretic Peptide: 16 pg/mL (ref <100)

## 2018-06-06 LAB — BASIC METABOLIC PANEL WITH GFR
BUN: 11 mg/dL (ref 7–25)
CO2: 26 mmol/L (ref 20–32)
Calcium: 9.1 mg/dL (ref 8.6–10.2)
Chloride: 104 mmol/L (ref 98–110)
Creat: 0.81 mg/dL (ref 0.50–1.10)
GFR, Est African American: 106 mL/min/{1.73_m2} (ref >=60)
GFR, Est Non African American: 91 mL/min/{1.73_m2} (ref >=60)
Glucose, Bld: 136 mg/dL (ref 65–139)
Potassium: 4 mmol/L (ref 3.5–5.3)
Sodium: 137 mmol/L (ref 135–146)

## 2018-06-06 LAB — MAGNESIUM: Magnesium: 1.9 mg/dL (ref 1.5–2.5)

## 2018-06-07 ENCOUNTER — Encounter: Payer: Self-pay | Admitting: Student

## 2018-06-07 NOTE — Progress Notes (Signed)
Cardiology Office Note    Date:  06/08/2018   ID:  [name], DOB 05/25/1981, MRN [id number]  PCP:  Raliegh Ip, DO  Cardiologist: Dina Rich, MD    Chief Complaint  Patient presents with  . Follow-up    1 month visit    History of Present Illness:    [name] is a 37 y.o. female with past medical history of CAD (s/p BMS to LAD and angioplasty of RI in 09/2011, patent stent by cath in 02/2012, 12/2013, and 02/2016 with most recent showing 30-40% dLAD and 30-40% RCA stenosis), chronic diastolic CHF, HTN, HLD, and Type 2 DM who presents to the office today for 28-month follow-up.   She was last examined by Dr. Wyline Mood on 05/10/2018 and denied any recent chest discomfort. She did report a 20 pound weight gain over the past 2 weeks with increased dyspnea on exertion and had been taking Torsemide 40 mg daily daily with occasional extra tablets. Weight was 401 lbs at the time of her visit and Torsemide was titrated to 60 mg twice daily with plans for repeat BMET in 1 week. The patient's spouse called the office 5 days later reporting that she was having severe dizziness, fatigue, and nausea with the increased dosing and had lost 11 pounds since her visit, therefore she was informed to hold her diuretic for 2 days and have labs obtained. She did not have labs until 06/05/2018 and these showed K+ 4.0, Na+ 137, and creatinine 0.81 with BNP improved to 16.  In talking with the patient today, she reports that she resumed taking Torsemide 40 mg twice daily approximately 2 weeks ago due to feeling drained throughout the day. Since then, her weight has increased by approximately 11 pounds on her home scales. She denies any associated dyspnea on exertion, orthopnea, PND, or lower extremity edema but has noticed worsening abdominal distention. No recent chest pain or palpitations.  She initially tells me that she follows a low-sodium diet but then says she has experienced multiple  episodes of dietary indiscretion by going to [place] for family events.    Past Medical History:  Diagnosis Date  . Allergy   . Anemia   . Anxiety   . CHF (congestive heart failure) (HCC)   . Chronic kidney disease    history of kidney stones  . Coronary artery disease 09/2011   a.s/p BMS to LAD and angioplasty of RI in 09/2011 b. patent stent by cath in 02/2012, 12/2013, and 02/2016 with most recent showing 30-40% dLAD and 30-40% RCA stenosis  . Depression   . Diabetes mellitus   . Hyperlipidemia   . Hypertension   . Midsternal chest pain 03/03/2012  . Migraines   . Morbid obesity (HCC)   . Myocardial infarct (HCC) 09/2011  . Myocardial infarction (HCC) 02/2016  . Palpitations 02/17/2012  . Sleep apnea   . Ureteral stone with hydronephrosis   . Urinary tract obstruction due to kidney stone 02/15/2017    Past Surgical History:  Procedure Laterality Date  . CARDIAC CATHETERIZATION  10/08/2011   LAD: 95% mid, Ramus: 95% ostial  . CARPAL TUNNEL RELEASE    . CESAREAN SECTION    . CHOLECYSTECTOMY    . CORONARY ANGIOPLASTY WITH STENT PLACEMENT  10/08/2011   LAD: BMS, Ramus: cutting balloon angioplasty  . CYSTOSCOPY WITH RETROGRADE PYELOGRAM, URETEROSCOPY AND STENT PLACEMENT Left 02/16/2017   Procedure: CYSTOSCOPY WITH RETROGRADE PYELOGRAM, URETEROSCOPY AND STENT PLACEMENT;  Surgeon: Ihor Gully,  MD;  Location: [facility];  Service: Urology;  Laterality: Left;  . CYSTOSCOPY WITH RETROGRADE PYELOGRAM, URETEROSCOPY AND STENT PLACEMENT Left 03/28/2017   Procedure: LEFT STENT REMOVAL LEFT RETROGRADE PYELOGRAM LEFT URETEROSCOPY   LASER LITHOTRIPSY  AND  STENT REPLACEMENT;  Surgeon: Malen Gauze, MD;  Location: [facility];  Service: Urology;  Laterality: Left;  . LEFT HEART CATHETERIZATION WITH CORONARY ANGIOGRAM N/A 10/08/2011   Procedure: LEFT HEART CATHETERIZATION WITH CORONARY ANGIOGRAM;  Surgeon: Peter M Swaziland, MD;  Location: [facility] CATH LAB;  Service: Cardiovascular;  Laterality: N/A;    . LEFT HEART CATHETERIZATION WITH CORONARY ANGIOGRAM N/A 03/02/2012   Procedure: LEFT HEART CATHETERIZATION WITH CORONARY ANGIOGRAM;  Surgeon: Kathleene Hazel, MD;  Location: [facility] CATH LAB;  Service: Cardiovascular;  Laterality: N/A;  . LEFT HEART CATHETERIZATION WITH CORONARY ANGIOGRAM N/A 01/04/2014   Procedure: LEFT HEART CATHETERIZATION WITH CORONARY ANGIOGRAM;  Surgeon: Lennette Bihari, MD;  Location: [facility] C[facility] LAB;  Service: Cardiovascular;  Laterality: N/A;    Current Medications: Outpatient Medications Prior to Visit  Medication Sig Dispense Refill  . aspirin 81 MG tablet Take 81 mg by mouth daily.    . carvedilol (COREG) 25 MG tablet TAKE 1 TABLET BY MOUTH TWICE DAILY WITH A MEAL 60 tablet 6  . ibuprofen (ADVIL,MOTRIN) 200 MG tablet Take 400 mg by mouth every 8 (eight) hours as needed for headache.    . losartan (COZAAR) 25 MG tablet Take 0.5 tablets (12.5 mg total) by mouth daily. 45 tablet 3  . LYRICA 75 MG capsule TAKE ONE CAPSULE BY MOUTH TWICE DAILY --PA NEEDED 60 capsule 0  . medroxyPROGESTERone (PROVERA) 5 MG tablet Take 5 mg by mouth daily.    . metFORMIN (GLUCOPHAGE) 1000 MG tablet Take 1 tablet (1,000 mg total) by mouth 2 (two) times daily. 60 tablet 1  . nitroGLYCERIN (NITROSTAT) 0.4 MG SL tablet Place 1 tablet (0.4 mg total) under the tongue every 5 (five) minutes as needed for chest pain. 25 tablet 3  . potassium chloride SA (K-DUR,KLOR-CON) 20 MEQ tablet Take 1 tablet (20 mEq total) by mouth daily. 90 tablet 3  . rosuvastatin (CRESTOR) 40 MG tablet Take 1 tablet (40 mg total) by mouth daily. 90 tablet 3  . torsemide (DEMADEX) 20 MG tablet Take 3 tablets (60 mg total) by mouth 2 (two) times daily. 180 tablet 3   No facility-administered medications prior to visit.      Allergies:   Bee venom and Sulfa drugs cross reactors   Social History   Socioeconomic History  . Marital status: Married    Spouse name: Ree Kida  . Number of children: 2  . Years of education: 10   . Highest education level: Not on file  Occupational History  . Not on file  Social Needs  . Financial resource strain: Not hard at all  . Food insecurity:    Worry: Never true    Inability: Never true  . Transportation needs:    Medical: No    Non-medical: No  Tobacco Use  . Smoking status: Current Every Day Smoker    Packs/day: 0.50    Years: 20.00    Pack years: 10.00    Types: Cigarettes    Start date: 03/18/1990  . Smokeless tobacco: Never Used  Substance and Sexual Activity  . Alcohol use: Yes    Alcohol/week: 0.0 standard drinks    Comment: maybe once a year  . Drug use: No  . Sexual activity: Yes  Birth control/protection: None    Comment: PCOS  Lifestyle  . Physical activity:    Days per week: 0 days    Minutes per session: 0 min  . Stress: To some extent  Relationships  . Social connections:    Talks on phone: More than three times a week    Gets together: More than three times a week    Attends religious service: Never    Active member of club or organization: No    Attends meetings of clubs or organizations: Never    Relationship status: Married  Other Topics Concern  . Not on file  Social History Narrative   Unemployed   Applied for disability   Leisure "take care of house and kids"   Walks when able     Family History:  The patient's family history includes Appendicitis in her son; Asthma in her son; Cancer in her father, maternal grandmother, mother, and paternal grandfather; Diabetes in her father and mother; Heart murmur in her father; Hernia in her son; Hyperlipidemia in her father; Hypertension in her father; Mental illness in her son; Stroke in her maternal grandmother.   Review of Systems:   Please see the history of present illness.     General:  No chills, fever, or night sweats. Positive for weight gain.  Cardiovascular:  No chest pain, dyspnea on exertion, edema, orthopnea, palpitations, paroxysmal nocturnal dyspnea. Dermatological:  No rash, lesions/masses Respiratory: No cough, dyspnea Urologic: No hematuria, dysuria Abdominal:   No nausea, vomiting, diarrhea, bright red blood per rectum, melena, or hematemesis. Positive for abdominal distension.  Neurologic:  No visual changes, wkns, changes in mental status. All other systems reviewed and are otherwise negative except as noted above.   Physical Exam:    VS:  BP 112/82   Pulse 95   Ht 5\' 9"  (1.753 m)   Wt (!) 403 lb (182.8 kg)   SpO2 99%   BMI 59.51 kg/m    General: Well developed, morbidly obese Caucasian female appearing in no acute distress. Head: Normocephalic, atraumatic, sclera non-icteric, no xanthomas, nares are without discharge.  Neck: No carotid bruits. JVD unable to be assessed secondary to body habitus.  Lungs: Respirations regular and unlabored, without wheezes or rales.  Heart: Regular rate and rhythm. No S3 or S4.  No murmur, no rubs, or gallops appreciated. Abdomen: Soft, non-tender, non-distended with normoactive bowel sounds. No hepatomegaly. No rebound/guarding. No obvious abdominal masses. Msk:  Strength and tone appear normal for age. No joint deformities or effusions. Extremities: No clubbing or cyanosis. Trace edema bilaterally.  Distal pedal pulses are 2+ bilaterally. Neuro: Alert and oriented X 3. Moves all extremities spontaneously. No focal deficits noted. Psych:  Responds to questions appropriately with a normal affect. Skin: No rashes or lesions noted  Wt Readings from Last 3 Encounters:  06/08/18 (!) 403 lb (182.8 kg)  05/10/18 (!) 401 lb 12.8 oz (182.3 kg)  05/10/18 (!) 401 lb 9.6 oz (182.2 kg)      Studies/Labs Reviewed:   EKG:  EKG is ordered today.  The ekg ordered today demonstrates normal sinus rhythm, heart rate 91, with nonspecific IVCD.  Recent Labs: 10/28/2017: ALT 19; Hemoglobin 13.4; Platelets 298; TSH 1.22 06/05/2018: Brain Natriuretic Peptide 16; BUN 11; Creat 0.81; Magnesium 1.9; Potassium 4.0; Sodium 137    Lipid Panel    Component Value Date/Time   CHOL 186 10/28/2017 1221   TRIG 216 (H) 10/28/2017 1221   HDL 30 (L) 10/28/2017 1221  CHOLHDL 6.2 (H) 10/28/2017 1221   VLDL 32 03/02/2012 0223   LDLCALC 122 (H) 10/28/2017 1221    Additional studies/ records that were reviewed today include:   Cardiac Catheterization: 03/2016   Echocardiogram: 03/2016   Assessment:    1. Coronary artery disease involving native coronary artery of native heart without angina pectoris   2. Chronic diastolic heart failure (HCC)   3. Essential hypertension   4. Hyperlipidemia LDL goal <70      Plan:   In order of problems listed above:  1. CAD  - s/p BMS to LAD and angioplasty of RI in 09/2011, patent stent by cath in 02/2012, 12/2013, and 02/2016 with most recent showing 30-40% dLAD and 30-40% RCA stenosis. - she denies any recent chest pain or dyspnea on exertion. No indication for repeat ischemic evaluation at this time.  - continue ASA, statin, and BB therapy.   2. Chronic Diastolic CHF - volume status is difficult to assess in the setting of her morbid obesity but she was unable to tolerate Torsemide 60mg  BID due to this making her feel fatigued and drained. Since resuming 40mg  BID, she has gained over 10 lbs on her home scales. She is willing to take a higher dose of the tablet in the PM as she reports already being awake multiple times throughout the night and taking additional dosing in the daytime caused more symptoms. Therefore, will adjust Rx to 40mg  in AM/60mg  in PM. Creatinine stable by recent labs on 06/05/2018. - we again reviewed the importance of sodium restriction as dietary indiscretion is likely playing a role in her fluid accumulation as well.   3. HTN - BP is well-controlled at 112/82 during today's visit. - continue Coreg 25mg  BID and Losartan 12.5mg  daily.   4. HLD - followed by PCP and due for repeat labs at her next visit by her report. Goal LDL is < 70 with known CAD.  Remains on Crestor 40mg  daily.   Medication Adjustments/Labs and Tests Ordered: Current medicines are reviewed at length with the patient today.  Concerns regarding medicines are outlined above.  Medication changes, Labs and Tests ordered today are listed in the Patient Instructions below. Patient Instructions  Medication Instructions:  Your physician has recommended you make the following change in your medication:  Take Torsesemide 40 mg in the AM and 60 mg in the PM.    Labwork: NONE   Testing/Procedures: NONE   Follow-Up: Your physician recommends that you schedule a follow-up appointment in: 2 months.    Any Other Special Instructions Will Be Listed Below (If Applicable).  If you need a refill on your cardiac medications before your next appointment, please call your pharmacy.  Thank you for choosing [facility]!    Signed, Ellsworth Lennox, PA-C  06/08/2018 8:27 PM    [facility] [address] [place], [place] [address] Phone: [phone]

## 2018-06-08 ENCOUNTER — Encounter: Payer: Self-pay | Admitting: Student

## 2018-06-08 ENCOUNTER — Ambulatory Visit: Payer: Medicaid Other | Admitting: Student

## 2018-06-08 VITALS — BP 112/82 | HR 95 | Ht 69.0 in | Wt >= 6400 oz

## 2018-06-08 DIAGNOSIS — I2510 Atherosclerotic heart disease of native coronary artery without angina pectoris: Secondary | ICD-10-CM | POA: Diagnosis not present

## 2018-06-08 DIAGNOSIS — I10 Essential (primary) hypertension: Secondary | ICD-10-CM | POA: Diagnosis not present

## 2018-06-08 DIAGNOSIS — E785 Hyperlipidemia, unspecified: Secondary | ICD-10-CM | POA: Diagnosis not present

## 2018-06-08 DIAGNOSIS — I5032 Chronic diastolic (congestive) heart failure: Secondary | ICD-10-CM

## 2018-06-08 MED ORDER — TORSEMIDE 20 MG PO TABS: ORAL_TABLET | ORAL | 6 refills | Status: DC

## 2018-06-08 NOTE — Patient Instructions (Signed)
Medication Instructions:  Your physician has recommended you make the following change in your medication:  Take Torsesemide 40 mg in the AM and 60 mg in the PM.    Labwork: NONE   Testing/Procedures: NONE   Follow-Up: Your physician recommends that you schedule a follow-up appointment in: 2 months.    Any Other Special Instructions Will Be Listed Below (If Applicable).     If you need a refill on your cardiac medications before your next appointment, please call your pharmacy.  Thank you for choosing [facility]!

## 2018-06-13 ENCOUNTER — Encounter: Payer: Self-pay | Admitting: Family Medicine

## 2018-06-21 ENCOUNTER — Ambulatory Visit (HOSPITAL_COMMUNITY): Admission: RE | Admit: 2018-06-21 | Payer: Medicaid Other | Source: Ambulatory Visit

## 2018-06-26 ENCOUNTER — Ambulatory Visit: Payer: Medicaid Other | Admitting: Family Medicine

## 2018-06-26 VITALS — BP 125/83 | HR 90 | Temp 98.4°F | Ht 69.0 in | Wt >= 6400 oz

## 2018-06-26 DIAGNOSIS — M545 Low back pain, unspecified: Secondary | ICD-10-CM

## 2018-06-26 DIAGNOSIS — I10 Essential (primary) hypertension: Secondary | ICD-10-CM

## 2018-06-26 DIAGNOSIS — E1122 Type 2 diabetes mellitus with diabetic chronic kidney disease: Secondary | ICD-10-CM | POA: Diagnosis not present

## 2018-06-26 DIAGNOSIS — N182 Chronic kidney disease, stage 2 (mild): Secondary | ICD-10-CM

## 2018-06-26 DIAGNOSIS — I252 Old myocardial infarction: Secondary | ICD-10-CM

## 2018-06-26 DIAGNOSIS — G8929 Other chronic pain: Secondary | ICD-10-CM | POA: Diagnosis not present

## 2018-06-26 DIAGNOSIS — Z23 Encounter for immunization: Secondary | ICD-10-CM

## 2018-06-26 LAB — BAYER DCA HB A1C WAIVED: HB A1C (BAYER DCA - WAIVED): 7.8 % — ABNORMAL HIGH (ref <7.0)

## 2018-06-26 MED ORDER — LIRAGLUTIDE 18 MG/3ML ~~LOC~~ SOPN: 0.6000 mg | PEN_INJECTOR | Freq: Every day | SUBCUTANEOUS | 0 refills | Status: DC

## 2018-06-26 MED ORDER — METFORMIN HCL 1000 MG PO TABS: 1000.0000 mg | ORAL_TABLET | Freq: Two times a day (BID) | ORAL | 5 refills | Status: DC

## 2018-06-26 MED ORDER — PREGABALIN 75 MG PO CAPS: 75.0000 mg | ORAL_CAPSULE | Freq: Two times a day (BID) | ORAL | 2 refills | Status: DC

## 2018-06-26 MED ORDER — BLOOD GLUCOSE METER KIT: PACK | 0 refills | Status: AC

## 2018-06-26 NOTE — Progress Notes (Signed)
Subjective: CC: DM PCP: Janora Norlander, DO ZWC:HENIDPOEU [name] is a 37 y.o. female presenting to clinic today for:  1. Type 2 Diabetes w/ HLD and HTN/ hx MI/neuropathy Patient reports: Glucometer: Needs replacement.  She was previously using [place] but states that Medicaid does not pay for refills of strips of this meter, High at home: 300s; fasting blood sugars at home: 113-145, Taking medication(s): Metformin 1000 milligrams p.o. twice daily, Crestor 40 mg daily and Cozaar 12.5 mg daily. side effects: None.  She also notes that she has bilateral peripheral neuropathy and states that she was previously on Lyrica but that this medication required a prior authorization by her previous PCP.  This is never pursued and she is wondering if we can attempt to get this medication for her, as it did help quite a bit.  She is previously been treated with gabapentin but states that it stopped working and that is when she was transitioned over to Lyrica.  Last eye exam: Had at my eye doctor earlier this year. Last foot exam: Up-to-date Last A1c: 10/2017 was 6.6. Nephropathy screen indicated?: on ACE-I Last flu, zoster and/or pneumovax: needs Flu  ROS: denies fever, chills, dizziness, LOC, polyuria, polydipsia, unintended weight loss/gain, foot ulcerations, She reports chronic LE and hand numbness or tingling in extremities.  No chest pain.  2.  Morbid obesity Patient reports that she tried to get an appointment with the bariatric surgeon in [place].  She actually went through the online course and when she went to make the appointment was told that they did not accept Medicaid.  She is unsure as to who accepts her insurance for bariatric surgery but she is [place] very interested in pursuing this.  She reports that she has totally cleaned up her diet and continues to have difficulty losing weight.  She reports about a 200 pound weight loss since she had her heart attack but has since plateaued.  She  reports appropriate portion sizes, consumption of lean meats.  She avoids carbohydrates.  However, she goes on to say that she drinks soda intermittently.  ROS: Per HPI  Allergies  Allergen Reactions  . Bee Venom Anaphylaxis and Swelling  . Sulfa Drugs Cross Reactors Nausea And Vomiting   Past Medical History:  Diagnosis Date  . Allergy   . Anemia   . Anxiety   . CHF (congestive heart failure) ([place])   . Chronic kidney disease    history of kidney stones  . Coronary artery disease 09/2011   a.s/p BMS to LAD and angioplasty of RI in 09/2011 b. patent stent by cath in 02/2012, 12/2013, and 02/2016 with most recent showing 30-40% dLAD and 30-40% RCA stenosis  . Depression   . Diabetes mellitus   . Hyperlipidemia   . Hypertension   . Midsternal chest pain 03/03/2012  . Migraines   . Morbid obesity ([place])   . Myocardial infarct ([place]) 09/2011  . Myocardial infarction ([place]) 02/2016  . Palpitations 02/17/2012  . Sleep apnea   . Ureteral stone with hydronephrosis   . Urinary tract obstruction due to kidney stone 02/15/2017    Current Outpatient Medications:  .  aspirin 81 MG tablet, Take 81 mg by mouth daily., Disp: , Rfl:  .  carvedilol (COREG) 25 MG tablet, TAKE 1 TABLET BY MOUTH TWICE DAILY WITH A MEAL, Disp: 60 tablet, Rfl: 6 .  ibuprofen (ADVIL,MOTRIN) 200 MG tablet, Take 400 mg by mouth every 8 (eight) hours as needed for headache., Disp: ,  Rfl:  .  losartan (COZAAR) 25 MG tablet, Take 0.5 tablets (12.5 mg total) by mouth daily., Disp: 45 tablet, Rfl: 3 .  LYRICA 75 MG capsule, TAKE ONE CAPSULE BY MOUTH TWICE DAILY --PA NEEDED, Disp: 60 capsule, Rfl: 0 .  medroxyPROGESTERone (PROVERA) 5 MG tablet, Take 5 mg by mouth daily., Disp: , Rfl:  .  metFORMIN (GLUCOPHAGE) 1000 MG tablet, Take 1 tablet (1,000 mg total) by mouth 2 (two) times daily., Disp: 60 tablet, Rfl: 1 .  nitroGLYCERIN (NITROSTAT) 0.4 MG SL tablet, Place 1 tablet (0.4 mg total) under the tongue every 5 (five) minutes as  needed for chest pain., Disp: 25 tablet, Rfl: 3 .  potassium chloride SA (K-DUR,KLOR-CON) 20 MEQ tablet, Take 1 tablet (20 mEq total) by mouth daily., Disp: 90 tablet, Rfl: 3 .  rosuvastatin (CRESTOR) 40 MG tablet, Take 1 tablet (40 mg total) by mouth daily., Disp: 90 tablet, Rfl: 3 .  torsemide (DEMADEX) 20 MG tablet, Take 40 mg ( 2 Tablets) in the AM and 60 mg (30 Tablets) in the PM, Disp: 150 tablet, Rfl: 6 Social History   Socioeconomic History  . Marital status: Married    Spouse name: Barnabas Lister  . Number of children: 2  . Years of education: 10  . Highest education level: Not on file  Occupational History  . Not on file  Social Needs  . Financial resource strain: Not hard at all  . Food insecurity:    Worry: Never true    Inability: Never true  . Transportation needs:    Medical: No    Non-medical: No  Tobacco Use  . Smoking status: Current Every Day Smoker    Packs/day: 0.50    Years: 20.00    Pack years: 10.00    Types: Cigarettes    Start date: 03/18/1990  . Smokeless tobacco: Never Used  Substance and Sexual Activity  . Alcohol use: Yes    Alcohol/week: 0.0 standard drinks    Comment: maybe once a year  . Drug use: No  . Sexual activity: Yes    Birth control/protection: None    Comment: PCOS  Lifestyle  . Physical activity:    Days per week: 0 days    Minutes per session: 0 min  . Stress: To some extent  Relationships  . Social connections:    Talks on phone: More than three times a week    Gets together: More than three times a week    Attends religious service: Never    Active member of club or organization: No    Attends meetings of clubs or organizations: Never    Relationship status: Married  . Intimate partner violence:    Fear of current or ex partner: No    Emotionally abused: No    Physically abused: No    Forced sexual activity: No  Other Topics Concern  . Not on file  Social History Narrative   Unemployed   Applied for disability   Leisure  "take care of house and kids"   Walks when able   Family History  Problem Relation Age of Onset  . Heart murmur Father   . Diabetes Father   . Hypertension Father   . Hyperlipidemia Father   . Cancer Father        throat  . Diabetes Mother   . Cancer Mother        breast.uterine, ovarian  . Asthma Son   . Appendicitis Son   . Hernia  Son   . Mental illness Son        Bipolar, personality Katelyn/o  . Stroke Maternal Grandmother   . Cancer Maternal Grandmother        breast  . Cancer Paternal Grandfather        lung    Objective: Office vital signs reviewed. BP 125/83   Pulse 90   Temp 98.4 F (36.9 C) (Oral)   Ht 5' 9"  (1.753 m)   Wt (!) 402 lb (182.3 kg)   BMI 59.37 kg/m   Physical Examination:  General: Awake, alert, morbidly obese, No acute distress HEENT: Normal, sclera white, MMM Cardio: regular rate and rhythm, S1S2 heard, no murmurs appreciated Pulm: clear to auscultation bilaterally, no wheezes, rhonchi or rales; normal work of breathing on room air Extremities: warm, well perfused, No edema, cyanosis or clubbing; +2 pulses bilaterally Neuro: decreased sensation to bilateral feet.  Assessment/ Plan: 37 y.o. female   1. Type 2 diabetes mellitus with stage 2 chronic kidney disease, without long-term current use of insulin (HCC) A1c has gone up quite a bit since last check.  Today was 7.8.  We discussed different medications that were available and patient wished to pursue Victoza.  This has been prescribed.  Instructions for use discussed with the patient.  Lyrica prescribed.  Controlled substance contract reviewed and signed.  She will follow-up in 1 month for recheck. - Bayer DCA Hb A1c Waived - Amb Referral to Bariatric Surgery  2. History of acute anterior wall MI Start Victoza as above.  If does not tolerate, will consider Jardiance.  Should also consider Repatha given elevation in LDL on 10/2017 panel.  Plan to recheck Lipid at next visit. - Amb Referral to  Bariatric Surgery  3. Morbid obesity ([place]) - Amb Referral to Bariatric Surgery  4. Essential hypertension Controlled.  - Amb Referral to Bariatric Surgery  5. Chronic midline low back pain without sciatica - Amb Referral to Bariatric Surgery  6. Need for influenza vaccination Administered during today's visit.    Orders Placed This Encounter  Procedures  . Bayer DCA Hb A1c Waived   Meds ordered this encounter  Medications  . pregabalin (LYRICA) 75 MG capsule    Sig: Take 1 capsule (75 mg total) by mouth 2 (two) times daily.    Dispense:  60 capsule    Refill:  2  . metFORMIN (GLUCOPHAGE) 1000 MG tablet    Sig: Take 1 tablet (1,000 mg total) by mouth 2 (two) times daily.    Dispense:  60 tablet    Refill:  5  . liraglutide (VICTOZA) 18 MG/3ML SOPN    Sig: Inject 0.1 mLs (0.6 mg total) into the skin daily.    Dispense:  3 mL    Refill:  0  . blood glucose meter kit and supplies    Sig: Dispense based on patient and insurance preference. Use up to four times daily as directed. (FOR ICD-10:  E11.22). Check BG daily    Dispense:  1 each    Refill:  0    Order Specific Question:   Number of strips    Answer:   100    Order Specific Question:   Number of lancets    Answer:   100   The Narcotic Database has been reviewed.  There were no red flags.      Janora Norlander, DO [place] [phone]

## 2018-06-26 NOTE — Patient Instructions (Addendum)
Your A1c is high at 7.8 today. You were given a sample of Victoza.  Inject 0.6mg  into the skin ONCE daily (better at night).  We discussed you can decrease it to 3 clicks if needed.  Side effects include decreased appetite, nausea and some stomach pain.  If you develop SEVERE abdominal pain or inability to stay hydrated, discontinue the medicaiton.  Continue the Metformin twice daily.  See me in 1 month.

## 2018-06-27 ENCOUNTER — Telehealth: Payer: Self-pay

## 2018-06-27 NOTE — Telephone Encounter (Signed)
Medicaid non preferred Pregabalin   Preferred are duloxetine cap., and gabapentin

## 2018-06-27 NOTE — Telephone Encounter (Signed)
Failed gabapentin previously.  She is aware that a PA may be needed.  Can we proceed with it?

## 2018-07-06 ENCOUNTER — Ambulatory Visit (HOSPITAL_COMMUNITY)
Admission: RE | Admit: 2018-07-06 | Discharge: 2018-07-06 | Disposition: A | Discharge status: Home / Self Care | Payer: Medicaid Other | Source: Ambulatory Visit | Attending: Obstetrics and Gynecology | Admitting: Obstetrics and Gynecology

## 2018-07-06 DIAGNOSIS — N939 Abnormal uterine and vaginal bleeding, unspecified: Secondary | ICD-10-CM | POA: Diagnosis not present

## 2018-07-06 DIAGNOSIS — E282 Polycystic ovarian syndrome: Secondary | ICD-10-CM | POA: Insufficient documentation

## 2018-07-10 ENCOUNTER — Telehealth: Payer: Self-pay | Admitting: Family Medicine

## 2018-07-10 ENCOUNTER — Other Ambulatory Visit: Payer: Self-pay | Admitting: Family Medicine

## 2018-07-10 MED ORDER — LIRAGLUTIDE 18 MG/3ML ~~LOC~~ SOPN: 0.6000 mg | PEN_INJECTOR | Freq: Every day | SUBCUTANEOUS | 1 refills | Status: DC

## 2018-07-10 NOTE — Telephone Encounter (Signed)
Sent to pharmacy.  Continue 0.6mg  daily x2 more weeks, then can increase to 1.2mg  daily.  Follow up in 2 months for recheck DM.

## 2018-07-10 NOTE — Telephone Encounter (Signed)
Patient is needing a rx for victoza. Patients husband states she is doing good on it now.

## 2018-07-11 ENCOUNTER — Ambulatory Visit: Payer: Medicaid Other | Admitting: Family Medicine

## 2018-07-11 ENCOUNTER — Other Ambulatory Visit: Payer: Self-pay

## 2018-07-11 MED ORDER — INSULIN PEN NEEDLE 32G X 6 MM MISC: 1.0000 | Freq: Every day | 3 refills | Status: DC

## 2018-07-11 MED ORDER — LIRAGLUTIDE 18 MG/3ML ~~LOC~~ SOPN: 0.6000 mg | PEN_INJECTOR | Freq: Every day | SUBCUTANEOUS | 1 refills | Status: DC

## 2018-07-11 NOTE — Telephone Encounter (Signed)
Spoke with patient's husband and he is aware the Victoza was sent to pharmacy.  They Piet[place] need needles, so a prescription was sent to [facility] these also.

## 2018-07-17 ENCOUNTER — Encounter: Payer: Self-pay | Admitting: Family Medicine

## 2018-07-17 ENCOUNTER — Ambulatory Visit: Payer: Medicaid Other | Admitting: Family Medicine

## 2018-07-17 VITALS — BP 131/83 | HR 89 | Temp 98.1°F | Ht 69.0 in | Wt >= 6400 oz

## 2018-07-17 DIAGNOSIS — J069 Acute upper respiratory infection, unspecified: Secondary | ICD-10-CM

## 2018-07-17 DIAGNOSIS — N182 Chronic kidney disease, stage 2 (mild): Secondary | ICD-10-CM

## 2018-07-17 DIAGNOSIS — E1122 Type 2 diabetes mellitus with diabetic chronic kidney disease: Secondary | ICD-10-CM | POA: Diagnosis not present

## 2018-07-17 DIAGNOSIS — N939 Abnormal uterine and vaginal bleeding, unspecified: Secondary | ICD-10-CM | POA: Diagnosis not present

## 2018-07-17 DIAGNOSIS — B9789 Other viral agents as the cause of diseases classified elsewhere: Secondary | ICD-10-CM

## 2018-07-17 MED ORDER — MEDROXYPROGESTERONE ACETATE 5 MG PO TABS: 5.0000 mg | ORAL_TABLET | Freq: Every day | ORAL | 1 refills | Status: DC

## 2018-07-17 MED ORDER — BENZONATATE 100 MG PO CAPS: 100.0000 mg | ORAL_CAPSULE | Freq: Three times a day (TID) | ORAL | 0 refills | Status: DC | PRN

## 2018-07-17 NOTE — Patient Instructions (Signed)

## 2018-07-17 NOTE — Progress Notes (Signed)
Subjective: CC: Type 2 diabetes PCP: Janora Norlander, DO CXK:GYJEHUDJS [name] is a 37 y.o. female presenting to clinic today for:  1.  Type 2 diabetes Patient was started on Victoza about 2 to 3 weeks ago.  She notes that during the first 3 days she did have a bit of nausea and vomiting.  However, that is since subsided and she has been tolerating Victoza without difficulty.  Blood sugars are running around 150 at home.  She continues to use metformin as prescribed.  She is tolerating fluids and food without difficulty now.  She often eats about 2 meals per day and feels that she becomes full easily.  2.  Abnormal vaginal bleeding Patient was seen for abnormal uterine bleeding on 05/10/2018 by Dr. Glo Herring.  She had pelvic ultrasound which showed an endometrial thickness of 5 mm.  No adnexal masses were identified.  She notes that she only has 1 tablet of the Provera left and would like to have this refilled.  She is not sure she is supposed to follow-up again with Dr. Glo Herring but does report some reluctance to have IUD inserted or any type of ablation performed.  3.  Sinus symptoms/hoarseness Patient reports a 1 day history of sinus symptoms and hoarseness.  She reports that she recently went to the emergency department with her son who was ill recently.  There are many family members with similar sickness.  Denies any hemoptysis or fevers.  ROS: Per HPI  Allergies  Allergen Reactions  . Bee Venom Anaphylaxis and Swelling  . Sulfa Drugs Cross Reactors Nausea And Vomiting   Past Medical History:  Diagnosis Date  . Allergy   . Anemia   . Anxiety   . CHF (congestive heart failure) ([place])   . Chronic kidney disease    history of kidney stones  . Coronary artery disease 09/2011   a.s/p BMS to LAD and angioplasty of RI in 09/2011 b. patent stent by cath in 02/2012, 12/2013, and 02/2016 with most recent showing 30-40% dLAD and 30-40% RCA stenosis  . Depression   . Diabetes mellitus    . Hyperlipidemia   . Hypertension   . Midsternal chest pain 03/03/2012  . Migraines   . Morbid obesity ([place])   . Myocardial infarct ([place]) 09/2011  . Myocardial infarction ([place]) 02/2016  . Palpitations 02/17/2012  . Sleep apnea   . Ureteral stone with hydronephrosis   . Urinary tract obstruction due to kidney stone 02/15/2017    Current Outpatient Medications:  .  aspirin 81 MG tablet, Take 81 mg by mouth daily., Disp: , Rfl:  .  blood glucose meter kit and supplies, Dispense based on patient and insurance preference. Use up to four times daily as directed. (FOR ICD-10:  E11.22). Check BG daily, Disp: 1 each, Rfl: 0 .  carvedilol (COREG) 25 MG tablet, TAKE 1 TABLET BY MOUTH TWICE DAILY WITH A MEAL, Disp: 60 tablet, Rfl: 6 .  ibuprofen (ADVIL,MOTRIN) 200 MG tablet, Take 400 mg by mouth every 8 (eight) hours as needed for headache., Disp: , Rfl:  .  Insulin Pen Needle (NOVOFINE) 32G X 6 MM MISC, 1 each by Does not apply route daily., Disp: 100 each, Rfl: 3 .  liraglutide (VICTOZA) 18 MG/3ML SOPN, Inject 0.1-0.2 mLs (0.6-1.2 mg total) into the skin daily., Disp: 3 mL, Rfl: 1 .  losartan (COZAAR) 25 MG tablet, Take 0.5 tablets (12.5 mg total) by mouth daily., Disp: 45 tablet, Rfl: 3 .  medroxyPROGESTERone (  PROVERA) 5 MG tablet, Take 5 mg by mouth daily., Disp: , Rfl:  .  metFORMIN (GLUCOPHAGE) 1000 MG tablet, Take 1 tablet (1,000 mg total) by mouth 2 (two) times daily., Disp: 60 tablet, Rfl: 5 .  nitroGLYCERIN (NITROSTAT) 0.4 MG SL tablet, Place 1 tablet (0.4 mg total) under the tongue every 5 (five) minutes as needed for chest pain., Disp: 25 tablet, Rfl: 3 .  potassium chloride SA (K-DUR,KLOR-CON) 20 MEQ tablet, Take 1 tablet (20 mEq total) by mouth daily., Disp: 90 tablet, Rfl: 3 .  pregabalin (LYRICA) 75 MG capsule, Take 1 capsule (75 mg total) by mouth 2 (two) times daily., Disp: 60 capsule, Rfl: 2 .  rosuvastatin (CRESTOR) 40 MG tablet, Take 1 tablet (40 mg total) by mouth daily., Disp: 90  tablet, Rfl: 3 .  torsemide (DEMADEX) 20 MG tablet, Take 40 mg ( 2 Tablets) in the AM and 60 mg (30 Tablets) in the PM, Disp: 150 tablet, Rfl: 6 Social History   Socioeconomic History  . Marital status: Married    Spouse name: Barnabas Lister  . Number of children: 2  . Years of education: 10  . Highest education level: Not on file  Occupational History  . Not on file  Social Needs  . Financial resource strain: Not hard at all  . Food insecurity:    Worry: Never true    Inability: Never true  . Transportation needs:    Medical: No    Non-medical: No  Tobacco Use  . Smoking status: Current Every Day Smoker    Packs/day: 0.50    Years: 20.00    Pack years: 10.00    Types: Cigarettes    Start date: 03/18/1990  . Smokeless tobacco: Never Used  Substance and Sexual Activity  . Alcohol use: Yes    Alcohol/week: 0.0 standard drinks    Comment: maybe once a year  . Drug use: No  . Sexual activity: Yes    Birth control/protection: None    Comment: PCOS  Lifestyle  . Physical activity:    Days per week: 0 days    Minutes per session: 0 min  . Stress: To some extent  Relationships  . Social connections:    Talks on phone: More than three times a week    Gets together: More than three times a week    Attends religious service: Never    Active member of club or organization: No    Attends meetings of clubs or organizations: Never    Relationship status: Married  . Intimate partner violence:    Fear of current or ex partner: No    Emotionally abused: No    Physically abused: No    Forced sexual activity: No  Other Topics Concern  . Not on file  Social History Narrative   Unemployed   Applied for disability   Leisure "take care of house and kids"   Walks when able   Family History  Problem Relation Age of Onset  . Heart murmur Father   . Diabetes Father   . Hypertension Father   . Hyperlipidemia Father   . Cancer Father        throat  . Diabetes Mother   . Cancer Mother          breast.uterine, ovarian  . Asthma Son   . Appendicitis Son   . Hernia Son   . Mental illness Son        Bipolar, personality Katelyn/o  . Stroke Maternal  Grandmother   . Cancer Maternal Grandmother        breast  . Cancer Paternal Grandfather        lung    Objective: Office vital signs reviewed. BP 131/83   Pulse 89   Temp 98.1 F (36.7 C) (Other (Comment))   Ht _0  (1.753 m)   Wt (!) 400 lb (181.4 kg)   BMI 59.07 kg/m   Physical Examination:  General: Awake, alert, morbid obesity, No acute distress HEENT: Normal    Neck: No masses palpated. No lymphadenopathy    Ears: Tympanic membranes intact, normal light reflex, no erythema, no bulging    Eyes: PERRLA, extraocular membranes intact, sclera white    Nose: nasal turbinates moist, clear nasal discharge    Throat: moist mucus membranes, mild oropharyngeal erythema, no tonsillar exudate.  Airway is patent Pulm: intermittent coughing. normal work of breathing on room air  Assessment/ Plan: 37 y.o. female   1. Type 2 diabetes mellitus with stage 2 chronic kidney disease, without long-term current use of insulin (HCC) Continue Victoza.  This has been refilled.  She may advance to 1.2 mg injected into the skin in about 2 weeks.  We discussed that if she has return of extreme nausea or vomiting after increasing medication, she may resume 0.6 mg dosing and increase by 1 click every 2 days until she reaches 1.2 mg daily.  She was good understanding of follow-up in 2 months for repeat A1c.  2. Morbid obesity ([place]) Has had about a 2 pound weight loss since last visit.  I anticipate that she will continue to have some weight loss with this Victoza  3. Viral URI with cough Nothing on exam to suggest bacterial infection.  Tessalon Perles has been sent for cough.  4. Abnormal uterine bleeding I see that patient had a vaginal ultrasound performed on 07/06/2018.  From her reports, she was not aware that she would be seeing Dr.  Glo Herring again.  She again voiced reluctance to have IUD inserted.  I will reach out to him to see if continuing Provera long-term is appropriate or if there are any plans for endometrial biopsy.  For now, refill of the Provera has been sent.    Meds ordered this encounter  Medications  . benzonatate (TESSALON PERLES) 100 MG capsule    Sig: Take 1 capsule (100 mg total) by mouth 3 (three) times daily as needed.    Dispense:  20 capsule    Refill:  0  . medroxyPROGESTERone (PROVERA) 5 MG tablet    Sig: Take 1 tablet (5 mg total) by mouth daily.    Dispense:  90 tablet    Refill:  [place], [place] [phone]

## 2018-07-31 ENCOUNTER — Other Ambulatory Visit: Payer: Self-pay | Admitting: Cardiology

## 2018-08-04 ENCOUNTER — Other Ambulatory Visit: Payer: Self-pay | Admitting: Family Medicine

## 2018-08-04 NOTE — Telephone Encounter (Signed)
We should recheck this level before refilling.  Patient should be on maintenance OTC Vit D 1000 units daily.

## 2018-08-04 NOTE — Telephone Encounter (Signed)
Husband aware per dpr.  

## 2018-08-11 ENCOUNTER — Ambulatory Visit (INDEPENDENT_AMBULATORY_CARE_PROVIDER_SITE_OTHER): Payer: Medicaid Other | Admitting: Cardiology

## 2018-08-11 ENCOUNTER — Encounter: Payer: Self-pay | Admitting: Cardiology

## 2018-08-11 VITALS — BP 140/88 | HR 94 | Ht 69.0 in | Wt 396.0 lb

## 2018-08-11 DIAGNOSIS — I5032 Chronic diastolic (congestive) heart failure: Secondary | ICD-10-CM | POA: Diagnosis not present

## 2018-08-11 DIAGNOSIS — I10 Essential (primary) hypertension: Secondary | ICD-10-CM

## 2018-08-11 DIAGNOSIS — I25119 Atherosclerotic heart disease of native coronary artery with unspecified angina pectoris: Secondary | ICD-10-CM

## 2018-08-11 DIAGNOSIS — Z01818 Encounter for other preprocedural examination: Secondary | ICD-10-CM

## 2018-08-11 DIAGNOSIS — E782 Mixed hyperlipidemia: Secondary | ICD-10-CM | POA: Diagnosis not present

## 2018-08-11 NOTE — Patient Instructions (Signed)
Medication Instructions:  Your physician recommends that you continue on your current medications as directed. Please refer to the Current Medication list given to you today.   Labwork: bmet Cbc   Testing/Procedures: Your physician has requested that you have a cardiac catheterization. Cardiac catheterization is used to diagnose and/or treat various heart conditions. Doctors may recommend this procedure for a number of different reasons. The most common reason is to evaluate chest pain. Chest pain can be a symptom of coronary artery disease (CAD), and cardiac catheterization can show whether plaque is narrowing or blocking your heart's arteries. This procedure is also used to evaluate the valves, as well as measure the blood flow and oxygen levels in different parts of your heart. For further information please visit [email or web address] Please follow instruction sheet, as given.    Follow-Up: Your physician recommends that you schedule a follow-up appointment in: 3 weeks    Any Other Special Instructions Will Be Listed Below (If Applicable).     If you need a refill on your cardiac medications before your next appointment, please call your pharmacy.    [facility] [address] [place] [address] Dept: [phone] Loc: [phone]  Kowsar D Yero  08/11/2018  You are scheduled for a Cardiac Catheterization on Tuesday, November 19 with Dr. Peter [place].  1. Please arrive at the [facility] (Main Entrance A) at [facility]: [address] [place], [place] [address] at 11:30 AM (This time is two hours before your procedure to ensure your preparation). Free valet parking service is available.   Special note: Every effort is made to have your procedure done on time. Please understand that emergencies sometimes delay scheduled procedures.  2. Diet: Do not eat solid foods after midnight.  The  patient may have clear liquids until 5am upon the day of the procedure.  3. Labs:Monday Nov. 18th @ [facility]   4. Medication instructions in preparation for your procedure:   Contrast Allergy: No      Do not take torsemide the day of cath  Do not take victoza the night before or the morning of cath  Do not take metformin the day before , the day of & 2 days after cath    On the morning of your procedure, take your Aspirin and any morning medicines NOT listed above.  You may use sips of water.  5. Plan for one night stay--bring personal belongings. 6. Bring a current list of your medications and current insurance cards. 7. You MUST have a responsible person to drive you home. 8. Someone MUST be with you the first 24 hours after you arrive home or your discharge will be delayed. 9. Please wear clothes that are easy to get on and off and wear slip-on shoes.  Thank you for allowing [place] to care for you!   -- [facility]

## 2018-08-11 NOTE — Progress Notes (Signed)
Clinical Summary Ms. Forsberg is a 37 y.o.female seen today for follow up of the following medical problems.  1. CAD - prior anterior MI 09/2011, 95% lesion in mid LAD and ramus with 95% ostial stenosis. LVEF 55% by LV gram, apical hypokinesis. BMS to mid LAD and cutting balloon angioplasty of ramus.  - repeat cath 02/2012 with patent vessels and stent - 09/2011 Echo: LVEF 50% - 12/2013 Lexiscan large anteroseptal ischemia - cath 12/2013 with patent vessels - cath 02/2016 [facility] in setting of NSTEMI showed LM patent, LAD patent with patent stent, distal LAD 30-40%, LCX patent, RCA 30-40%. LVEDP 40, PCWP 30 mean PA 32, CI 4.93 - echo 02/2016 [place] LVEF 50%, apical akinesis   - 3 episdoes over the 2.5 weeks of chest pain - pressure midchest and left shoulder, 4/10 in severity. Often occur when laying in bed. +SOB, mild nausea. Would sit up. Would last 15-40 minutes. Tried NG with some improvement.  - can have some increase SOB above her baseline. Can have some pain with exertion.  - no change in severity or frequency.     2. Chronic diastolic HF .- previousvisit had weight gain, edema. Increased toresemide to 16m bid. Dropped 11 pounds, developed dizziness and fatigue. On 476mbid she gained back 10 lbs. Last visit she was changed to torsemide 4075mn AM and 54m77m PM (she preferred higher evening dose).    - tolerating torsemide 40mg74mAM and 54mg 26mDenies any edema. Chronic SOB. Better with sitting up. WEight is coming down. Chronic orthopnea unchanged - home weights   3. HTN -compliant with meds  4. Hyperlipidemia -compliant with statin   5. Obesity - considering bariatric intervention    Past Medical History:  Diagnosis Date  . Allergy   . Anemia   . Anxiety   . CHF (congestive heart failure) (HCC)  GordonChronic kidney disease    history of kidney stones  . Coronary artery disease 09/2011   a.s/p BMS to LAD and angioplasty of RI in 09/2011 b.  patent stent by cath in 02/2012, 12/2013, and 02/2016 with most recent showing 30-40% dLAD and 30-40% RCA stenosis  . Depression   . Diabetes mellitus   . Hyperlipidemia   . Hypertension   . Midsternal chest pain 03/03/2012  . Migraines   . Morbid obesity (HCC)  [place]enburg[place]ocardial infarct (HCC) [id number]  . Myocardial infarction (HCC) [id number]  . Palpitations 02/17/2012  . Sleep apnea   . Ureteral stone with hydronephrosis   . Urinary tract obstruction due to kidney stone 02/15/2017     Allergies  Allergen Reactions  . Bee Venom Anaphylaxis and Swelling  . Sulfa Drugs Cross Reactors Nausea And Vomiting     Current Outpatient Medications  Medication Sig Dispense Refill  . ACCU-CHEK AVIVA PLUS test strip USE TO TEST BLOOD SUGAR FOUR TIMES DAILY 400 each 3  . aspirin 81 MG tablet Take 81 mg by mouth daily.    . benzonatate (TESSALON PERLES) 100 MG capsule Take 1 capsule (100 mg total) by mouth 3 (three) times daily as needed. 20 capsule 0  . blood glucose meter kit and supplies Dispense based on patient and insurance preference. Use up to four times daily as directed. (FOR ICD-10:  E11.22). Check BG daily 1 each 0  . carvedilol (COREG) 25 MG tablet TAKE 1 TABLET BY MOUTH TWICE DAILY WITH A MEAL 60 tablet 6  . ibuprofen (ADVIL,MOTRIN) 200 MG tablet Take  400 mg by mouth every 8 (eight) hours as needed for headache.    . Insulin Pen Needle (NOVOFINE) 32G X 6 MM MISC 1 each by Does not apply route daily. 100 each 3  . liraglutide (VICTOZA) 18 MG/3ML SOPN Inject 0.1-0.2 mLs (0.6-1.2 mg total) into the skin daily. 3 mL 1  . losartan (COZAAR) 25 MG tablet Take 0.5 tablets (12.5 mg total) by mouth daily. 45 tablet 3  . medroxyPROGESTERone (PROVERA) 5 MG tablet Take 1 tablet (5 mg total) by mouth daily. 90 tablet 1  . metFORMIN (GLUCOPHAGE) 1000 MG tablet Take 1 tablet (1,000 mg total) by mouth 2 (two) times daily. 60 tablet 5  . nitroGLYCERIN (NITROSTAT) 0.4 MG SL tablet DISSOLVE 1 TABLET UNDER THE  TONGUE EVERY 5 MINUTES AS NEEDED FOR CHEST PAIN. DO NOT EXCEED A TOTAL OF 3 DOSES IN 15 MINUTES. 25 tablet 3  . potassium chloride SA (K-DUR,KLOR-CON) 20 MEQ tablet Take 1 tablet (20 mEq total) by mouth daily. 90 tablet 3  . pregabalin (LYRICA) 75 MG capsule Take 1 capsule (75 mg total) by mouth 2 (two) times daily. 60 capsule 2  . rosuvastatin (CRESTOR) 40 MG tablet Take 1 tablet (40 mg total) by mouth daily. 90 tablet 3  . torsemide (DEMADEX) 20 MG tablet Take 40 mg ( 2 Tablets) in the AM and 60 mg (30 Tablets) in the PM 150 tablet 6   No current facility-administered medications for this visit.      Past Surgical History:  Procedure Laterality Date  . CARDIAC CATHETERIZATION  10/08/2011   LAD: 95% mid, Ramus: 95% ostial  . CARPAL TUNNEL RELEASE    . CESAREAN SECTION    . CHOLECYSTECTOMY    . CORONARY ANGIOPLASTY WITH STENT PLACEMENT  10/08/2011   LAD: BMS, Ramus: cutting balloon angioplasty  . CYSTOSCOPY WITH RETROGRADE PYELOGRAM, URETEROSCOPY AND STENT PLACEMENT Left 02/16/2017   Procedure: CYSTOSCOPY WITH RETROGRADE PYELOGRAM, URETEROSCOPY AND STENT PLACEMENT;  Surgeon: Kathie Rhodes, MD;  Location: [facility];  Service: Urology;  Laterality: Left;  . CYSTOSCOPY WITH RETROGRADE PYELOGRAM, URETEROSCOPY AND STENT PLACEMENT Left 03/28/2017   Procedure: LEFT STENT REMOVAL LEFT RETROGRADE PYELOGRAM LEFT URETEROSCOPY   LASER LITHOTRIPSY  AND  STENT REPLACEMENT;  Surgeon: Cleon Gustin, MD;  Location: [facility];  Service: Urology;  Laterality: Left;  . LEFT HEART CATHETERIZATION WITH CORONARY ANGIOGRAM N/A 10/08/2011   Procedure: LEFT HEART CATHETERIZATION WITH CORONARY ANGIOGRAM;  Surgeon: Peter M Martinique, MD;  Location: [facility] CATH LAB;  Service: Cardiovascular;  Laterality: N/A;  . LEFT HEART CATHETERIZATION WITH CORONARY ANGIOGRAM N/A 03/02/2012   Procedure: LEFT HEART CATHETERIZATION WITH CORONARY ANGIOGRAM;  Surgeon: Burnell Blanks, MD;  Location: [facility] CATH LAB;  Service: Cardiovascular;   Laterality: N/A;  . LEFT HEART CATHETERIZATION WITH CORONARY ANGIOGRAM N/A 01/04/2014   Procedure: LEFT HEART CATHETERIZATION WITH CORONARY ANGIOGRAM;  Surgeon: Troy Sine, MD;  Location: [facility] CATH LAB;  Service: Cardiovascular;  Laterality: N/A;     Allergies  Allergen Reactions  . Bee Venom Anaphylaxis and Swelling  . Sulfa Drugs Cross Reactors Nausea And Vomiting      Family History  Problem Relation Age of Onset  . Heart murmur Father   . Diabetes Father   . Hypertension Father   . Hyperlipidemia Father   . Cancer Father        throat  . Diabetes Mother   . Cancer Mother        breast.uterine, ovarian  . Asthma Son   .  Appendicitis Son   . Hernia Son   . Mental illness Son        Bipolar, personality d/o  . Stroke Maternal Grandmother   . Cancer Maternal Grandmother        breast  . Cancer Paternal Grandfather        lung     Social History Ms. Lehane reports that she has been smoking cigarettes. She started smoking about 28 years ago. She has a 10.00 pack-year smoking history. She has never used smokeless tobacco. Ms. Nater reports that she drinks alcohol.   Review of Systems CONSTITUTIONAL: No weight loss, fever, chills, weakness or fatigue.  HEENT: Eyes: No visual loss, blurred vision, double vision or yellow sclerae.No hearing loss, sneezing, congestion, runny nose or sore throat.  SKIN: No rash or itching.  CARDIOVASCULAR: per hpi RESPIRATORY: No shortness of breath, cough or sputum.  GASTROINTESTINAL: No anorexia, nausea, vomiting or diarrhea. No abdominal pain or blood.  GENITOURINARY: No burning on urination, no polyuria NEUROLOGICAL: No headache, dizziness, syncope, paralysis, ataxia, numbness or tingling in the extremities. No change in bowel or bladder control.  MUSCULOSKELETAL: No muscle, back pain, joint pain or stiffness.  LYMPHATICS: No enlarged nodes. No history of splenectomy.  PSYCHIATRIC: No history of depression or anxiety.    ENDOCRINOLOGIC: No reports of sweating, cold or heat intolerance. No polyuria or polydipsia.  Marland Kitchen   Physical Examination Vitals:   08/11/18 1530  BP: 140/88  Pulse: 94  SpO2: 98%   Vitals:   08/11/18 1530  Weight: (!) 396 lb (179.6 kg)  Height: _0  (1.753 m)    Gen: resting comfortably, no acute distress HEENT: no scleral icterus, pupils equal round and reactive, no palptable cervical adenopathy,  CV: RRR, no m/r/g no jvd Resp: Clear to auscultation bilaterally GI: abdomen is soft, non-tender, non-distended, normal bowel sounds, no hepatosplenomegaly MSK: extremities are warm, no edema.  Skin: warm, no rash Neuro:  no focal deficits Psych: appropriate affect   Diagnostic Studies  02/2012 Cath Hemodynamic Findings: Central aortic pressure: 108/70  Left ventricular pressure: 123/10/21  Angiographic Findings: Left main: No obstructive disease noted.  Left Anterior Descending Artery: Large caliber vessel that courses to the apex. There is a stent present in the mid vessel that is widely patent with no restenosis. The remainder of the LAD is disease free. There is a moderate sized diagonal Brodrick Curran with 30% proximal stenosis.  Circumflex Artery: Dominant, large caliber vessel with no disease throughout the AV groove segment. There is small to moderate sized intermediate Fatina Sprankle that has ostial 30% stenosis. The remainder of the Circumflex has no obstructive disease.  Right Coronary Artery: Small, non-dominant vessel. No disease noted.  Left Ventricular Angiogram: LVEF 50-55%.  Impression:  1. Double vessel CAD with patent stent mid LAD and patent angioplasty site at ostium of intermediate Zykerria Tanton.  2. Normal LV systolic function   11/3293 Cath PROCEDURAL FINDINGS  Hemodynamics:  AO 135/92 with a mean of 114 mmHg  LV 136/25 mmHg  Coronary angiography:  Coronary dominance: Left  Left mainstem: Normal.  Left anterior descending (LAD): There is a 95%  stenosis in the mid LAD immediately after the takeoff of the first diagonal. The first diagonal Edmund Holcomb is moderate in size and appears normal. The LAD stenosis does appear to be hazy.  There is a moderately large ramus intermediate Zaul Hubers which has a 95% ostial stenosis.  Left circumflex (LCx): This is a dominant vessel and appears normal throughout.  Right coronary artery (  RCA): This is a small nondominant vessel and is normal.  Left ventriculography: Left ventricular systolic function is abnormal with apical hypokinesis. LVEF is estimated at 55%, there is no significant mitral regurgitation  PCI Note: Following the diagnostic procedure, the decision was made to proceed with PCI of the LAD. Weight-based bivalirudin was given for anticoagulation. Once a therapeutic ACT was achieved, a 5 [place] EBU guide catheter was inserted. A pro-water coronary guidewire was used to cross the lesion. The lesion was predilated with a 2.5 mm balloon. The lesion was then stented with a 3.0 x 18 mm vision stent. The stent was postdilated with a 3.25 mm noncompliant balloon. Following PCI, there was 0% residual stenosis and TIMI-3 flow. At this point the patient's pain was improved but was Frary of moderate intensity. We then proceeded to intervene on the ostial ramus intermediate lesion. This was crossed with the prowater wire. This was dilated using a 3.0 x 10 mm cutting balloon performing 3 inflations to 6 atmospheres. This showed a good angiographic result with less than 30% residual stenosis and TIMI grade 3 flow. Final angiography confirmed an excellent result. The patient tolerated the procedure well. There were no immediate procedural complications. A TR band was used for radial hemostasis. The patient was transferred to the post catheterization recovery area for further monitoring.  PCI Data:  Vessel - LAD/Segment - mid vessel immediately after the takeoff of the first diagonal.  Percent Stenosis (pre) 95%   TIMI-flow 3.  Stent 3.0 x 18 mm vision stent.  Percent Stenosis (post) 0%  TIMI-flow (post) 3.  Second vessel: Ostial ramus intermediate Lakena Sparlin  Percent stenosis: 95%.  TIMI flow 3.  3.0 mm cutting balloon  Percent stenosis (post) less than 30%  TIMI flow 3  Final Conclusions:  1. Severe 2 vessel obstructive coronary disease.  2. Overall well preserved LV systolic function with apical wall motion abnormality and ejection fraction of 55%.  3. Successful intracoronary stenting of the mid LAD with a bare-metal stent.  4. Successful cutting balloon angioplasty of the ramus intermediate Knight Oelkers.  Recommendations:  Continue therapy with Plavix. Patient reports a history of allergy to aspirin. Aggressive risk factor modification.  Disposition: Patient be observed in the intensive care unit tonight. If she has no complications she should be able to be transferred to telemetry in the morning.   12/2013 Cath HEMODYNAMICS:  Central Aorta: 100/60  Left Ventricle: 100/3  ANGIOGRAPHY:  1. Left main: Normal and trifurcated into an LAD, ramus intermediate vessel, and a dominant left circumflex coronary artery  2. LAD: Leandrew Koyanagi gave rise to a proximal large first diagonal vessel. The LAD just after the diagonal vessel had a widely patent stent. The remainder of the LAD was free of significant disease and extended to the LV apex. 3. Ramus Intermediate: No evidence for restenosis at previous site of ostial cutting balloon intervention. 4. Left circumflex: Angiographically normal. Dominant vessel, which gave rise to 2 marginal branches and in the PDA.Marland Kitchen  4. Right coronary artery: Angiographically normal, nondominant vessel  Left ventriculography revealed global LV function. There was a suggestion of mild residual distal anterolateral hypocontractility. There was catheter-induced mitral regurgitation.  IMPRESSION:  Normal LV function with mild distal anterolateral,  residual hypocontractility  No significant residual coronary obstructive disease with evidence for widely patent LAD stent, no evidence for restenosis of the ramus intermediate vessel, normal dominant left circumflex coronary artery and normal nondominant right coronary artery.    12/2013 MPI IMPRESSION: 1. Abnormal Lexiscan  for ischemia  2. Large anteroseptal and apical area of ischemia  3. High risk study for major cardiac events based on area of myocardium at [facility]  4. Normal left ventricular systolic function, left ventricular ejection fraction 52%   Assessment and Plan  1. CAD -recent chest pain symptoms concenring for possible angina - due to body habitus noninvasive ischemic testing is of limited value - given her history and recent symptoms would plan for cath to further evaluate   2. Chronic diastolic HF - continue current diuretic. Volume status has been difficult to assess by exam due to body habitus. On more potent diuretics she had symptoms of dehydration, continue current diuretics, follow up LVEDP from cath.     3. HTN - mildly elevated, has been at goal last few visits. Continue to monitor.   4. Hyperlipidemia -she will conitue statin  I have reviewed the risks, indications, and alternatives to cardiac catheterization, possible angioplasty, and stenting with the patient today. Risks include but are not limited to bleeding, infection, vascular injury, stroke, myocardial infection, arrhythmia, kidney injury, radiation-related injury in the case of prolonged fluoroscopy use, emergency cardiac surgery, and death. The patient understands the risks of serious complication is 1-2 in 5953 with diagnostic cardiac cath and 1-2% or less with angioplasty/stenting.       Arnoldo Lenis, M.D.

## 2018-08-11 NOTE — H&P (View-Only) (Signed)
Clinical Summary [name] is a 37 y.o.female seen today for follow up of the following medical problems.  1. CAD - prior anterior MI 09/2011, 95% lesion in mid LAD and ramus with 95% ostial stenosis. LVEF 55% by LV gram, apical hypokinesis. BMS to mid LAD and cutting balloon angioplasty of ramus.  - repeat cath 02/2012 with patent vessels and stent - 09/2011 Echo: LVEF 50% - 12/2013 Lexiscan large anteroseptal ischemia - cath 12/2013 with patent vessels - cath 02/2016 [facility] in setting of NSTEMI showed LM patent, LAD patent with patent stent, distal LAD 30-40%, LCX patent, RCA 30-40%. LVEDP 40, PCWP 30 mean PA 32, CI 4.93 - echo 02/2016 [place] LVEF 50%, apical akinesis   - 3 episdoes over the 2.5 weeks of chest pain - pressure midchest and left shoulder, 4/10 in severity. Often occur when laying in bed. +SOB, mild nausea. Would sit up. Would last 15-40 minutes. Tried NG with some improvement.  - can have some increase SOB above her baseline. Can have some pain with exertion.  - no change in severity or frequency.     2. Chronic diastolic HF .- previousvisit had weight gain, edema. Increased toresemide to 16m bid. Dropped 11 pounds, developed dizziness and fatigue. On 476mbid she gained back 10 lbs. Last visit she was changed to torsemide 4075mn AM and 54m77m PM (she preferred higher evening dose).    - tolerating torsemide 40mg74mAM and 54mg 26mDenies any edema. Chronic SOB. Better with sitting up. WEight is coming down. Chronic orthopnea unchanged - home weights   3. HTN -compliant with meds  4. Hyperlipidemia -compliant with statin   5. Obesity - considering bariatric intervention    Past Medical History:  Diagnosis Date  . Allergy   . Anemia   . Anxiety   . CHF (congestive heart failure) (HCC)  GordonChronic kidney disease    history of kidney stones  . Coronary artery disease 09/2011   a.s/p BMS to LAD and angioplasty of RI in 09/2011 b.  patent stent by cath in 02/2012, 12/2013, and 02/2016 with most recent showing 30-40% dLAD and 30-40% RCA stenosis  . Depression   . Diabetes mellitus   . Hyperlipidemia   . Hypertension   . Midsternal chest pain 03/03/2012  . Migraines   . Morbid obesity (HCC)  [place]ocardial infarct (HCC) [id number]  . Myocardial infarction (HCC) [id number]  . Palpitations 02/17/2012  . Sleep apnea   . Ureteral stone with hydronephrosis   . Urinary tract obstruction due to kidney stone 02/15/2017     Allergies  Allergen Reactions  . Bee Venom Anaphylaxis and Swelling  . Sulfa Drugs Cross Reactors Nausea And Vomiting     Current Outpatient Medications  Medication Sig Dispense Refill  . ACCU-CHEK AVIVA PLUS test strip USE TO TEST BLOOD SUGAR FOUR TIMES DAILY 400 each 3  . aspirin 81 MG tablet Take 81 mg by mouth daily.    . benzonatate (TESSALON PERLES) 100 MG capsule Take 1 capsule (100 mg total) by mouth 3 (three) times daily as needed. 20 capsule 0  . blood glucose meter kit and supplies Dispense based on patient and insurance preference. Use up to four times daily as directed. (FOR ICD-10:  E11.22). Check BG daily 1 each 0  . carvedilol (COREG) 25 MG tablet TAKE 1 TABLET BY MOUTH TWICE DAILY WITH A MEAL 60 tablet 6  . ibuprofen (ADVIL,MOTRIN) 200 MG tablet Take  400 mg by mouth every 8 (eight) hours as needed for headache.    . Insulin Pen Needle (NOVOFINE) 32G X 6 MM MISC 1 each by Does not apply route daily. 100 each 3  . liraglutide (VICTOZA) 18 MG/3ML SOPN Inject 0.1-0.2 mLs (0.6-1.2 mg total) into the skin daily. 3 mL 1  . losartan (COZAAR) 25 MG tablet Take 0.5 tablets (12.5 mg total) by mouth daily. 45 tablet 3  . medroxyPROGESTERone (PROVERA) 5 MG tablet Take 1 tablet (5 mg total) by mouth daily. 90 tablet 1  . metFORMIN (GLUCOPHAGE) 1000 MG tablet Take 1 tablet (1,000 mg total) by mouth 2 (two) times daily. 60 tablet 5  . nitroGLYCERIN (NITROSTAT) 0.4 MG SL tablet DISSOLVE 1 TABLET UNDER THE  TONGUE EVERY 5 MINUTES AS NEEDED FOR CHEST PAIN. DO NOT EXCEED A TOTAL OF 3 DOSES IN 15 MINUTES. 25 tablet 3  . potassium chloride SA (K-DUR,KLOR-CON) 20 MEQ tablet Take 1 tablet (20 mEq total) by mouth daily. 90 tablet 3  . pregabalin (LYRICA) 75 MG capsule Take 1 capsule (75 mg total) by mouth 2 (two) times daily. 60 capsule 2  . rosuvastatin (CRESTOR) 40 MG tablet Take 1 tablet (40 mg total) by mouth daily. 90 tablet 3  . torsemide (DEMADEX) 20 MG tablet Take 40 mg ( 2 Tablets) in the AM and 60 mg (30 Tablets) in the PM 150 tablet 6   No current facility-administered medications for this visit.      Past Surgical History:  Procedure Laterality Date  . CARDIAC CATHETERIZATION  10/08/2011   LAD: 95% mid, Ramus: 95% ostial  . CARPAL TUNNEL RELEASE    . CESAREAN SECTION    . CHOLECYSTECTOMY    . CORONARY ANGIOPLASTY WITH STENT PLACEMENT  10/08/2011   LAD: BMS, Ramus: cutting balloon angioplasty  . CYSTOSCOPY WITH RETROGRADE PYELOGRAM, URETEROSCOPY AND STENT PLACEMENT Left 02/16/2017   Procedure: CYSTOSCOPY WITH RETROGRADE PYELOGRAM, URETEROSCOPY AND STENT PLACEMENT;  Surgeon: Ottelin, Mark, MD;  Location: [facility];  Service: Urology;  Laterality: Left;  . CYSTOSCOPY WITH RETROGRADE PYELOGRAM, URETEROSCOPY AND STENT PLACEMENT Left 03/28/2017   Procedure: LEFT STENT REMOVAL LEFT RETROGRADE PYELOGRAM LEFT URETEROSCOPY   LASER LITHOTRIPSY  AND  STENT REPLACEMENT;  Surgeon: McKenzie, Patrick L, MD;  Location: [facility];  Service: Urology;  Laterality: Left;  . LEFT HEART CATHETERIZATION WITH CORONARY ANGIOGRAM N/A 10/08/2011   Procedure: LEFT HEART CATHETERIZATION WITH CORONARY ANGIOGRAM;  Surgeon: Peter M Jordan, MD;  Location: [facility];  Service: Cardiovascular;  Laterality: N/A;  . LEFT HEART CATHETERIZATION WITH CORONARY ANGIOGRAM N/A 03/02/2012   Procedure: LEFT HEART CATHETERIZATION WITH CORONARY ANGIOGRAM;  Surgeon: Christopher D McAlhany, MD;  Location: [facility];  Service: Cardiovascular;   Laterality: N/A;  . LEFT HEART CATHETERIZATION WITH CORONARY ANGIOGRAM N/A 01/04/2014   Procedure: LEFT HEART CATHETERIZATION WITH CORONARY ANGIOGRAM;  Surgeon: Thomas A Kelly, MD;  Location: [facility];  Service: Cardiovascular;  Laterality: N/A;     Allergies  Allergen Reactions  . Bee Venom Anaphylaxis and Swelling  . Sulfa Drugs Cross Reactors Nausea And Vomiting      Family History  Problem Relation Age of Onset  . Heart murmur Father   . Diabetes Father   . Hypertension Father   . Hyperlipidemia Father   . Cancer Father        throat  . Diabetes Mother   . Cancer Mother        breast.uterine, ovarian  . Asthma Son   .   Appendicitis Son   . Hernia Son   . Mental illness Son        Bipolar, personality d/o  . Stroke Maternal Grandmother   . Cancer Maternal Grandmother        breast  . Cancer Paternal Grandfather        lung     Social History Ms. Scally reports that she has been smoking cigarettes. She started smoking about 28 years ago. She has a 10.00 pack-year smoking history. She has never used smokeless tobacco. Ms. Auer reports that she drinks alcohol.   Review of Systems CONSTITUTIONAL: No weight loss, fever, chills, weakness or fatigue.  HEENT: Eyes: No visual loss, blurred vision, double vision or yellow sclerae.No hearing loss, sneezing, congestion, runny nose or sore throat.  SKIN: No rash or itching.  CARDIOVASCULAR: per hpi RESPIRATORY: No shortness of breath, cough or sputum.  GASTROINTESTINAL: No anorexia, nausea, vomiting or diarrhea. No abdominal pain or blood.  GENITOURINARY: No burning on urination, no polyuria NEUROLOGICAL: No headache, dizziness, syncope, paralysis, ataxia, numbness or tingling in the extremities. No change in bowel or bladder control.  MUSCULOSKELETAL: No muscle, back pain, joint pain or stiffness.  LYMPHATICS: No enlarged nodes. No history of splenectomy.  PSYCHIATRIC: No history of depression or anxiety.    ENDOCRINOLOGIC: No reports of sweating, cold or heat intolerance. No polyuria or polydipsia.  Marland Kitchen   Physical Examination Vitals:   08/11/18 1530  BP: 140/88  Pulse: 94  SpO2: 98%   Vitals:   08/11/18 1530  Weight: (!) 396 lb (179.6 kg)  Height: _0  (1.753 m)    Gen: resting comfortably, no acute distress HEENT: no scleral icterus, pupils equal round and reactive, no palptable cervical adenopathy,  CV: RRR, no m/r/g no jvd Resp: Clear to auscultation bilaterally GI: abdomen is soft, non-tender, non-distended, normal bowel sounds, no hepatosplenomegaly MSK: extremities are warm, no edema.  Skin: warm, no rash Neuro:  no focal deficits Psych: appropriate affect   Diagnostic Studies  02/2012 Cath Hemodynamic Findings: Central aortic pressure: 108/70  Left ventricular pressure: 123/10/21  Angiographic Findings: Left main: No obstructive disease noted.  Left Anterior Descending Artery: Large caliber vessel that courses to the apex. There is a stent present in the mid vessel that is widely patent with no restenosis. The remainder of the LAD is disease free. There is a moderate sized diagonal Mele Sylvester with 30% proximal stenosis.  Circumflex Artery: Dominant, large caliber vessel with no disease throughout the AV groove segment. There is small to moderate sized intermediate Cyenna Rebello that has ostial 30% stenosis. The remainder of the Circumflex has no obstructive disease.  Right Coronary Artery: Small, non-dominant vessel. No disease noted.  Left Ventricular Angiogram: LVEF 50-55%.  Impression:  1. Double vessel CAD with patent stent mid LAD and patent angioplasty site at ostium of intermediate Princeston Blizzard.  2. Normal LV systolic function   11/3293 Cath PROCEDURAL FINDINGS  Hemodynamics:  AO 135/92 with a mean of 114 mmHg  LV 136/25 mmHg  Coronary angiography:  Coronary dominance: Left  Left mainstem: Normal.  Left anterior descending (LAD): There is a 95%  stenosis in the mid LAD immediately after the takeoff of the first diagonal. The first diagonal Dagan Heinz is moderate in size and appears normal. The LAD stenosis does appear to be hazy.  There is a moderately large ramus intermediate Cerissa Zeiger which has a 95% ostial stenosis.  Left circumflex (LCx): This is a dominant vessel and appears normal throughout.  Right coronary artery (  RCA): This is a small nondominant vessel and is normal.  Left ventriculography: Left ventricular systolic function is abnormal with apical hypokinesis. LVEF is estimated at 55%, there is no significant mitral regurgitation  PCI Note: Following the diagnostic procedure, the decision was made to proceed with PCI of the LAD. Weight-based bivalirudin was given for anticoagulation. Once a therapeutic ACT was achieved, a 5 [place] EBU guide catheter was inserted. A pro-water coronary guidewire was used to cross the lesion. The lesion was predilated with a 2.5 mm balloon. The lesion was then stented with a 3.0 x 18 mm vision stent. The stent was postdilated with a 3.25 mm noncompliant balloon. Following PCI, there was 0% residual stenosis and TIMI-3 flow. At this point the patient's pain was improved but was Keltz of moderate intensity. We then proceeded to intervene on the ostial ramus intermediate lesion. This was crossed with the prowater wire. This was dilated using a 3.0 x 10 mm cutting balloon performing 3 inflations to 6 atmospheres. This showed a good angiographic result with less than 30% residual stenosis and TIMI grade 3 flow. Final angiography confirmed an excellent result. The patient tolerated the procedure well. There were no immediate procedural complications. A TR band was used for radial hemostasis. The patient was transferred to the post catheterization recovery area for further monitoring.  PCI Data:  Vessel - LAD/Segment - mid vessel immediately after the takeoff of the first diagonal.  Percent Stenosis (pre) 95%   TIMI-flow 3.  Stent 3.0 x 18 mm vision stent.  Percent Stenosis (post) 0%  TIMI-flow (post) 3.  Second vessel: Ostial ramus intermediate Eltha Tingley  Percent stenosis: 95%.  TIMI flow 3.  3.0 mm cutting balloon  Percent stenosis (post) less than 30%  TIMI flow 3  Final Conclusions:  1. Severe 2 vessel obstructive coronary disease.  2. Overall well preserved LV systolic function with apical wall motion abnormality and ejection fraction of 55%.  3. Successful intracoronary stenting of the mid LAD with a bare-metal stent.  4. Successful cutting balloon angioplasty of the ramus intermediate Renelle Stegenga.  Recommendations:  Continue therapy with Plavix. Patient reports a history of allergy to aspirin. Aggressive risk factor modification.  Disposition: Patient be observed in the intensive care unit tonight. If she has no complications she should be able to be transferred to telemetry in the morning.   12/2013 Cath HEMODYNAMICS:  Central Aorta: 100/60  Left Ventricle: 100/3  ANGIOGRAPHY:  1. Left main: Normal and trifurcated into an LAD, ramus intermediate vessel, and a dominant left circumflex coronary artery  2. LAD: Leandrew Koyanagi gave rise to a proximal large first diagonal vessel. The LAD just after the diagonal vessel had a widely patent stent. The remainder of the LAD was free of significant disease and extended to the LV apex. 3. Ramus Intermediate: No evidence for restenosis at previous site of ostial cutting balloon intervention. 4. Left circumflex: Angiographically normal. Dominant vessel, which gave rise to 2 marginal branches and in the PDA.Marland Kitchen  4. Right coronary artery: Angiographically normal, nondominant vessel  Left ventriculography revealed global LV function. There was a suggestion of mild residual distal anterolateral hypocontractility. There was catheter-induced mitral regurgitation.  IMPRESSION:  Normal LV function with mild distal anterolateral,  residual hypocontractility  No significant residual coronary obstructive disease with evidence for widely patent LAD stent, no evidence for restenosis of the ramus intermediate vessel, normal dominant left circumflex coronary artery and normal nondominant right coronary artery.    12/2013 MPI IMPRESSION: 1. Abnormal Lexiscan  for ischemia  2. Large anteroseptal and apical area of ischemia  3. High risk study for major cardiac events based on area of myocardium at [facility]  4. Normal left ventricular systolic function, left ventricular ejection fraction 52%   Assessment and Plan  1. CAD -recent chest pain symptoms concenring for possible angina - due to body habitus noninvasive ischemic testing is of limited value - given her history and recent symptoms would plan for cath to further evaluate   2. Chronic diastolic HF - continue current diuretic. Volume status has been difficult to assess by exam due to body habitus. On more potent diuretics she had symptoms of dehydration, continue current diuretics, follow up LVEDP from cath.     3. HTN - mildly elevated, has been at goal last few visits. Continue to monitor.   4. Hyperlipidemia -she will conitue statin  I have reviewed the risks, indications, and alternatives to cardiac catheterization, possible angioplasty, and stenting with the patient today. Risks include but are not limited to bleeding, infection, vascular injury, stroke, myocardial infection, arrhythmia, kidney injury, radiation-related injury in the case of prolonged fluoroscopy use, emergency cardiac surgery, and death. The patient understands the risks of serious complication is 1-2 in 5953 with diagnostic cardiac cath and 1-2% or less with angioplasty/stenting.       Arnoldo Lenis, M.D.

## 2018-08-13 ENCOUNTER — Other Ambulatory Visit: Payer: Self-pay | Admitting: Cardiology

## 2018-08-13 DIAGNOSIS — R0789 Other chest pain: Secondary | ICD-10-CM

## 2018-08-14 ENCOUNTER — Other Ambulatory Visit (HOSPITAL_COMMUNITY)
Admission: RE | Admit: 2018-08-14 | Discharge: 2018-08-14 | Disposition: A | Discharge status: Home / Self Care | Payer: Medicaid Other | Source: Ambulatory Visit | Attending: Cardiology | Admitting: Cardiology

## 2018-08-14 ENCOUNTER — Telehealth: Payer: Self-pay | Admitting: *Deleted

## 2018-08-14 DIAGNOSIS — Z01818 Encounter for other preprocedural examination: Secondary | ICD-10-CM | POA: Diagnosis not present

## 2018-08-14 LAB — CBC WITH DIFFERENTIAL/PLATELET
Abs Immature Granulocytes: 0.08 10*3/uL — ABNORMAL HIGH (ref 0.00–0.07)
Basophils Absolute: 0.1 10*3/uL (ref 0.0–0.1)
Basophils Relative: 0 %
Eosinophils Absolute: 0.2 10*3/uL (ref 0.0–0.5)
Eosinophils Relative: 1 %
HCT: 43.7 % (ref 36.0–46.0)
Hemoglobin: 14.8 g/dL (ref 12.0–15.0)
Immature Granulocytes: 1 %
Lymphocytes Relative: 27 %
Lymphs Abs: 3.6 10*3/uL (ref 0.7–4.0)
MCH: 30.6 pg (ref 26.0–34.0)
MCHC: 33.9 g/dL (ref 30.0–36.0)
MCV: 90.3 fL (ref 80.0–100.0)
Monocytes Absolute: 0.7 10*3/uL (ref 0.1–1.0)
Monocytes Relative: 5 %
Neutro Abs: 8.8 10*3/uL — ABNORMAL HIGH (ref 1.7–7.7)
Neutrophils Relative %: 66 %
Platelets: 307 10*3/uL (ref 150–400)
RBC: 4.84 MIL/uL (ref 3.87–5.11)
RDW: 12.6 % (ref 11.5–15.5)
WBC: 13.3 10*3/uL — ABNORMAL HIGH (ref 4.0–10.5)
nRBC: 0 % (ref 0.0–0.2)

## 2018-08-14 LAB — BASIC METABOLIC PANEL
Anion gap: 11 (ref 5–15)
BUN: 12 mg/dL (ref 6–20)
CO2: 22 mmol/L (ref 22–32)
Calcium: 9.1 mg/dL (ref 8.9–10.3)
Chloride: 102 mmol/L (ref 98–111)
Creatinine, Ser: 0.74 mg/dL (ref 0.44–1.00)
GFR calc Af Amer: >60 mL/min (ref >60)
GFR calc non Af Amer: >60 mL/min (ref >60)
Glucose, Bld: 120 mg/dL — ABNORMAL HIGH (ref 70–99)
Potassium: 3.5 mmol/L (ref 3.5–5.1)
Sodium: 135 mmol/L (ref 135–145)

## 2018-08-14 NOTE — Telephone Encounter (Addendum)
Pt contacted pre-catheterization scheduled at [facility] for: Tuesday August 15, 2018 1:30 PM Verified arrival time and place: [facility] Main Entrance A at: 11:30 AM  No solid food after midnight prior to cath, clear liquids until 5 AM day of procedure. Contrast allergy: no  Hold: Insulin-AM of procedure. Metformin-day of procedure and 48 hours post procedure. Victoza-will hold. Torsemide-AM of procedure. KCl-AM of procedure.  Except hold medications AM meds can be  taken pre-cath with sip of water including: ASA 81 mg  Confirmed patient has responsible person to drive home post procedure and for 24 hours after you arrive home: yes I spoke with patient's husband, Jack(DPR), pt resting and he asked me to review instructions with him, he verbalized understanding.  Copied from lab results: Notes recorded by Antoine Poche, MD on 08/14/2018 at 11:10 AM EST Labs overall look good. Nothing to prevent cath. Her white blood cell is mildly elevated, we have seen some of this before for her in the past so may be chronic, but also can be sign of infection. Any recent infectious symptoms (cough, sore throat, fevers/chills, vomiting or diarrhea, pain or frequent urination?)  Ree Kida states pt denies any infectious symptoms, including cough, sore throat, fever, chills, vomiting, diarrhea, painful/frequent urination, any acute illness symptoms and is ready to have catheterization done tomorrow.

## 2018-08-15 ENCOUNTER — Telehealth: Payer: Self-pay

## 2018-08-15 ENCOUNTER — Encounter (HOSPITAL_COMMUNITY)
Admission: RE | Disposition: A | Discharge status: Home / Self Care | Payer: Self-pay | Source: Ambulatory Visit | Attending: Cardiology

## 2018-08-15 ENCOUNTER — Other Ambulatory Visit: Payer: Self-pay

## 2018-08-15 ENCOUNTER — Ambulatory Visit (HOSPITAL_COMMUNITY)
Admission: RE | Admit: 2018-08-15 | Discharge: 2018-08-15 | Disposition: A | Discharge status: Home / Self Care | Payer: Medicaid Other | Source: Ambulatory Visit | Attending: Cardiology | Admitting: Cardiology

## 2018-08-15 DIAGNOSIS — N189 Chronic kidney disease, unspecified: Secondary | ICD-10-CM | POA: Insufficient documentation

## 2018-08-15 DIAGNOSIS — Z87442 Personal history of urinary calculi: Secondary | ICD-10-CM | POA: Insufficient documentation

## 2018-08-15 DIAGNOSIS — Z793 Long term (current) use of hormonal contraceptives: Secondary | ICD-10-CM | POA: Insufficient documentation

## 2018-08-15 DIAGNOSIS — F419 Anxiety disorder, unspecified: Secondary | ICD-10-CM | POA: Diagnosis not present

## 2018-08-15 DIAGNOSIS — Z79899 Other long term (current) drug therapy: Secondary | ICD-10-CM | POA: Insufficient documentation

## 2018-08-15 DIAGNOSIS — I252 Old myocardial infarction: Secondary | ICD-10-CM | POA: Diagnosis not present

## 2018-08-15 DIAGNOSIS — R0789 Other chest pain: Secondary | ICD-10-CM

## 2018-08-15 DIAGNOSIS — G4730 Sleep apnea, unspecified: Secondary | ICD-10-CM | POA: Diagnosis not present

## 2018-08-15 DIAGNOSIS — E1122 Type 2 diabetes mellitus with diabetic chronic kidney disease: Secondary | ICD-10-CM | POA: Diagnosis not present

## 2018-08-15 DIAGNOSIS — Z6841 Body Mass Index (BMI) 40.0 and over, adult: Secondary | ICD-10-CM | POA: Insufficient documentation

## 2018-08-15 DIAGNOSIS — Z833 Family history of diabetes mellitus: Secondary | ICD-10-CM | POA: Insufficient documentation

## 2018-08-15 DIAGNOSIS — E1159 Type 2 diabetes mellitus with other circulatory complications: Secondary | ICD-10-CM | POA: Diagnosis present

## 2018-08-15 DIAGNOSIS — Z955 Presence of coronary angioplasty implant and graft: Secondary | ICD-10-CM | POA: Diagnosis not present

## 2018-08-15 DIAGNOSIS — F17210 Nicotine dependence, cigarettes, uncomplicated: Secondary | ICD-10-CM | POA: Diagnosis not present

## 2018-08-15 DIAGNOSIS — Z823 Family history of stroke: Secondary | ICD-10-CM | POA: Insufficient documentation

## 2018-08-15 DIAGNOSIS — Z7982 Long term (current) use of aspirin: Secondary | ICD-10-CM | POA: Diagnosis not present

## 2018-08-15 DIAGNOSIS — I25119 Atherosclerotic heart disease of native coronary artery with unspecified angina pectoris: Secondary | ICD-10-CM | POA: Diagnosis not present

## 2018-08-15 DIAGNOSIS — I209 Angina pectoris, unspecified: Secondary | ICD-10-CM | POA: Diagnosis present

## 2018-08-15 DIAGNOSIS — I5032 Chronic diastolic (congestive) heart failure: Secondary | ICD-10-CM | POA: Diagnosis present

## 2018-08-15 DIAGNOSIS — E119 Type 2 diabetes mellitus without complications: Secondary | ICD-10-CM

## 2018-08-15 DIAGNOSIS — E1169 Type 2 diabetes mellitus with other specified complication: Secondary | ICD-10-CM | POA: Diagnosis present

## 2018-08-15 DIAGNOSIS — E782 Mixed hyperlipidemia: Secondary | ICD-10-CM | POA: Diagnosis present

## 2018-08-15 DIAGNOSIS — Z9049 Acquired absence of other specified parts of digestive tract: Secondary | ICD-10-CM | POA: Diagnosis not present

## 2018-08-15 DIAGNOSIS — Z882 Allergy status to sulfonamides status: Secondary | ICD-10-CM | POA: Diagnosis not present

## 2018-08-15 DIAGNOSIS — Z791 Long term (current) use of non-steroidal anti-inflammatories (NSAID): Secondary | ICD-10-CM | POA: Diagnosis not present

## 2018-08-15 DIAGNOSIS — Z794 Long term (current) use of insulin: Secondary | ICD-10-CM | POA: Insufficient documentation

## 2018-08-15 DIAGNOSIS — I130 Hypertensive heart and chronic kidney disease with heart failure and stage 1 through stage 4 chronic kidney disease, or unspecified chronic kidney disease: Secondary | ICD-10-CM | POA: Insufficient documentation

## 2018-08-15 DIAGNOSIS — Z91030 Bee allergy status: Secondary | ICD-10-CM | POA: Diagnosis not present

## 2018-08-15 DIAGNOSIS — F329 Major depressive disorder, single episode, unspecified: Secondary | ICD-10-CM | POA: Diagnosis not present

## 2018-08-15 DIAGNOSIS — Z9889 Other specified postprocedural states: Secondary | ICD-10-CM | POA: Diagnosis not present

## 2018-08-15 DIAGNOSIS — I10 Essential (primary) hypertension: Secondary | ICD-10-CM | POA: Diagnosis present

## 2018-08-15 DIAGNOSIS — Z720 Tobacco use: Secondary | ICD-10-CM | POA: Diagnosis present

## 2018-08-15 DIAGNOSIS — Z8249 Family history of ischemic heart disease and other diseases of the circulatory system: Secondary | ICD-10-CM | POA: Insufficient documentation

## 2018-08-15 DIAGNOSIS — E785 Hyperlipidemia, unspecified: Secondary | ICD-10-CM | POA: Insufficient documentation

## 2018-08-15 DIAGNOSIS — I152 Hypertension secondary to endocrine disorders: Secondary | ICD-10-CM | POA: Diagnosis present

## 2018-08-15 DIAGNOSIS — Z7985 Type 2 diabetes mellitus without complications: Secondary | ICD-10-CM

## 2018-08-15 HISTORY — PX: LEFT HEART CATH AND CORONARY ANGIOGRAPHY: CATH118249

## 2018-08-15 LAB — GLUCOSE, CAPILLARY
Glucose-Capillary: 146 mg/dL — ABNORMAL HIGH (ref 70–99)
Glucose-Capillary: 124 mg/dL — ABNORMAL HIGH (ref 70–99)

## 2018-08-15 LAB — PREGNANCY, URINE: Preg Test, Ur: NEGATIVE

## 2018-08-15 SURGERY — LEFT HEART CATH AND CORONARY ANGIOGRAPHY
Anesthesia: LOCAL

## 2018-08-15 MED ORDER — MIDAZOLAM HCL 2 MG/2ML IJ SOLN
INTRAMUSCULAR | Status: DC | PRN
  Administered 2018-08-15: 1 mg via INTRAVENOUS

## 2018-08-15 MED ORDER — IOHEXOL 350 MG/ML SOLN
INTRAVENOUS | Status: DC | PRN
  Administered 2018-08-15: 80 mL via INTRACARDIAC

## 2018-08-15 MED ORDER — SODIUM CHLORIDE 0.9 % IV SOLN: INTRAVENOUS | Status: DC

## 2018-08-15 MED ORDER — FENTANYL CITRATE (PF) 100 MCG/2ML IJ SOLN
INTRAMUSCULAR | Status: AC
  Filled 2018-08-15: qty 2

## 2018-08-15 MED ORDER — VERAPAMIL HCL 2.5 MG/ML IV SOLN
INTRAVENOUS | Status: DC | PRN
  Administered 2018-08-15: 10 mL via INTRA_ARTERIAL

## 2018-08-15 MED ORDER — LIDOCAINE HCL (PF) 1 % IJ SOLN
INTRAMUSCULAR | Status: AC
  Filled 2018-08-15: qty 30

## 2018-08-15 MED ORDER — HEPARIN (PORCINE) IN NACL 1000-0.9 UT/500ML-% IV SOLN
INTRAVENOUS | Status: DC | PRN
  Administered 2018-08-15 (×2): 500 mL

## 2018-08-15 MED ORDER — FENTANYL CITRATE (PF) 100 MCG/2ML IJ SOLN
INTRAMUSCULAR | Status: DC | PRN
  Administered 2018-08-15: 25 ug via INTRAVENOUS

## 2018-08-15 MED ORDER — ISOSORBIDE MONONITRATE ER 30 MG PO TB24: 15.0000 mg | ORAL_TABLET | Freq: Every day | ORAL | 3 refills | Status: DC

## 2018-08-15 MED ORDER — METFORMIN HCL 1000 MG PO TABS: 1000.0000 mg | ORAL_TABLET | Freq: Two times a day (BID) | ORAL | 5 refills | Status: DC

## 2018-08-15 MED ORDER — MIDAZOLAM HCL 2 MG/2ML IJ SOLN
INTRAMUSCULAR | Status: AC
  Filled 2018-08-15: qty 2

## 2018-08-15 MED ORDER — SODIUM CHLORIDE 0.9% FLUSH: 3.0000 mL | Freq: Two times a day (BID) | INTRAVENOUS | Status: DC

## 2018-08-15 MED ORDER — LIDOCAINE HCL (PF) 1 % IJ SOLN
INTRAMUSCULAR | Status: DC | PRN
  Administered 2018-08-15: 2 mL

## 2018-08-15 MED ORDER — SODIUM CHLORIDE 0.9 % IV SOLN
INTRAVENOUS | Status: DC
  Administered 2018-08-15: 10:00:00 via INTRAVENOUS

## 2018-08-15 MED ORDER — SODIUM CHLORIDE 0.9% FLUSH: 3.0000 mL | INTRAVENOUS | Status: DC | PRN

## 2018-08-15 MED ORDER — SODIUM CHLORIDE 0.9 % IV SOLN: 250.0000 mL | INTRAVENOUS | Status: DC | PRN

## 2018-08-15 MED ORDER — ASPIRIN 81 MG PO CHEW: 81.0000 mg | CHEWABLE_TABLET | ORAL | Status: DC

## 2018-08-15 MED ORDER — ONDANSETRON HCL 4 MG/2ML IJ SOLN: 4.0000 mg | Freq: Four times a day (QID) | INTRAMUSCULAR | Status: DC | PRN

## 2018-08-15 MED ORDER — HEPARIN SODIUM (PORCINE) 1000 UNIT/ML IJ SOLN
INTRAMUSCULAR | Status: DC | PRN
  Administered 2018-08-15: 6000 [IU] via INTRAVENOUS

## 2018-08-15 MED ORDER — ACETAMINOPHEN 325 MG PO TABS: 650.0000 mg | ORAL_TABLET | ORAL | Status: DC | PRN

## 2018-08-15 SURGICAL SUPPLY — 11 items
CATH 5FR JL3.5 JR4 ANG PIG MP (CATHETERS) ×2 IMPLANT
DEVICE RAD COMP TR BAND LRG (VASCULAR PRODUCTS) ×2 IMPLANT
GLIDESHEATH SLEND SS 6F .021 (SHEATH) ×1 IMPLANT
GUIDEWIRE INQWIRE 1.5J.035X260 (WIRE) IMPLANT
INQWIRE 1.5J .035X260CM (WIRE) ×2
KIT HEART LEFT (KITS) ×2 IMPLANT
PACK CARDIAC CATHETERIZATION (CUSTOM PROCEDURE TRAY) ×2 IMPLANT
SHEATH PROBE COVER 6X72 (BAG) ×2 IMPLANT
SYR MEDRAD MARK V 150ML (SYRINGE) ×2 IMPLANT
TRANSDUCER W/STOPCOCK (MISCELLANEOUS) ×2 IMPLANT
TUBING CIL FLEX 10 FLL-RA (TUBING) ×2 IMPLANT

## 2018-08-15 NOTE — Discharge Instructions (Signed)

## 2018-08-15 NOTE — Telephone Encounter (Signed)
Can try imdur 15mg  daily, this is a pill form of nitroglycerin that is long acting. Update [place] in 1 week on symptoms, we can try adjusting dose if needed   Dominga Ferry MD

## 2018-08-15 NOTE — Interval H&P Note (Signed)
History and Physical Interval Note:  08/15/2018 11:33 AM  [name]  has presented today for surgery, with the diagnosis of cp  The various methods of treatment have been discussed with the patient and family. After consideration of risks, benefits and other options for treatment, the patient has consented to  Procedure(s): LEFT HEART CATH AND CORONARY ANGIOGRAPHY (N/A) as a surgical intervention .  The patient's history has been reviewed, patient examined, no change in status, stable for surgery.  I have reviewed the patient's chart and labs.  Questions were answered to the patient's satisfaction.   Cath Lab Visit (complete for each Cath Lab visit)  Clinical Evaluation Leading to the Procedure:   ACS: No.  Non-ACS:    Anginal Classification: CCS III  Anti-ischemic medical therapy: Minimal Therapy (1 class of medications)  Non-Invasive Test Results: No non-invasive testing performed  Prior CABG: No previous CABG        Theron Arista [facility] 08/15/2018 11:33 AM

## 2018-08-15 NOTE — Progress Notes (Signed)
Pt states she is not able to void. Serum preg order placed

## 2018-08-15 NOTE — Telephone Encounter (Signed)
Pt made aware, sent RX to [facility].

## 2018-08-15 NOTE — Telephone Encounter (Signed)
Pt's husband called stating that pt is done with heart cath. And it turned out fine. He stated that with her having constant chest pains that are keeping her from sleeping at night,  they would like to know if there is a medication that she could try to help. He stated that when she takes nitro it does stop the pain, but knows she cannot take them continuously. Please advise.

## 2018-08-16 ENCOUNTER — Encounter (HOSPITAL_COMMUNITY): Payer: Self-pay | Admitting: Cardiology

## 2018-08-18 ENCOUNTER — Telehealth: Payer: Self-pay | Admitting: Cardiology

## 2018-08-18 NOTE — Telephone Encounter (Signed)
Error/tg °

## 2018-08-24 ENCOUNTER — Other Ambulatory Visit: Payer: Self-pay | Admitting: Family Medicine

## 2018-09-01 ENCOUNTER — Telehealth: Payer: Self-pay

## 2018-09-01 MED ORDER — RANOLAZINE ER 500 MG PO TB12: 500.0000 mg | ORAL_TABLET | Freq: Two times a day (BID) | ORAL | 3 refills | Status: DC

## 2018-09-01 NOTE — Telephone Encounter (Signed)
Pt's husband called and stated that pt is having some shaking side effects from the imdur 15 mg daily. They did state that is does in fact help with her chest pains, but only about half of the day. They ask is there another medication that they could try that may help longer without the side effects. Please advise.

## 2018-09-01 NOTE — Telephone Encounter (Signed)
Spoke with pt. She is aware. Sent in script.

## 2018-09-01 NOTE — Addendum Note (Signed)
Addended by: Abelino Derrick R on: 09/01/2018 11:42 AM   Modules accepted: Orders

## 2018-09-01 NOTE — Telephone Encounter (Addendum)
    Would not further titrate given her side effects. Given her small vessel disease by recent cath, could try Ranexa 500mg  BID. There is not a generic of this so would need to see what her co-pay would be for the medication and is a prior authorization is indicated.   Signed, Ellsworth Lennox, PA-C 09/01/2018, 11:00 AM Pager: [phone]

## 2018-09-12 ENCOUNTER — Encounter: Payer: Self-pay | Admitting: Cardiology

## 2018-09-12 ENCOUNTER — Ambulatory Visit: Payer: Medicaid Other | Admitting: Cardiology

## 2018-09-12 VITALS — BP 144/82 | HR 98 | Wt 391.0 lb

## 2018-09-12 DIAGNOSIS — I25118 Atherosclerotic heart disease of native coronary artery with other forms of angina pectoris: Secondary | ICD-10-CM | POA: Diagnosis not present

## 2018-09-12 DIAGNOSIS — I5032 Chronic diastolic (congestive) heart failure: Secondary | ICD-10-CM

## 2018-09-12 NOTE — Progress Notes (Signed)
Clinical Summary [name] is a 37 y.o.female seen today for a focused visit on her history of CAD and recent chest pain.     1. CAD - prior anterior MI 09/2011, 95% lesion in mid LAD and ramus with 95% ostial stenosis. LVEF 55% by LV gram, apical hypokinesis. BMS to mid LAD and cutting balloon angioplasty of ramus.  - repeat cath 02/2012 with patent vessels and stent - 09/2011 Echo: LVEF 50% - 12/2013 Lexiscan large anteroseptal ischemia - cath 12/2013 with patent vessels - cath 02/2016 [facility] in setting of NSTEMI showed LM patent, LAD patent with patent stent, distal LAD 30-40%, LCX patent, RCA 30-40%. LVEDP 40, PCWP 30 mean PA 32, CI 4.93 - echo 02/2016 [place] LVEF 50%, apical akinesis   - 3 episdoes over the 2.5 weeks of chest pain - pressure midchest and left shoulder, 4/10 in severity. Often occur when laying in bed. +SOB, mild nausea. Would sit up. Would last 15-40 minutes. Tried NG with some improvement.  - can have some increase SOB above her baseline. Can have some pain with exertion.  - no change in severity or frequency.    - last visit due to symptoms we referred for cath - 07/2018 cath mild to moderate disease other than small D1 99% too small for PCI. Normal LVEDP - we tried imdur 48m daily. Reported some episodes of shaking on this medication. Changed to ranexa but has not filled yet.  - [place] with some chest pressure at times.       2. Chronic diastolic HF .- previousvisit had weight gain, edema. Increased toresemide to 656mbid. Dropped 11 pounds, developed dizziness and fatigue. On 4062mid she gained back 10 lbs. Last visit she was changed to torsemide 42m73m AM and 60mg59mPM (she preferred higher evening dose).    - tolerating torsemide 42mg 58mM and 60mg P49menies any edema. Chronic SOB. Better with sitting up. WEight is coming down. Chronic orthopnea unchanged - home weights   - normal LVEDP of 9 by cath 07/2018     Past Medical  History:  Diagnosis Date  . Allergy   . Anemia   . Anxiety   . CHF (congestive heart failure) (HCC)   [place]    history of kidney stones  . Coronary artery disease 09/2011   a.s/p BMS to LAD and angioplasty of RI in 09/2011 b. patent stent by cath in 02/2012, 12/2013, and 02/2016 with most recent showing 30-40% dLAD and 30-40% RCA stenosis  . Depression   . Diabetes mellitus   . Hyperlipidemia   . Hypertension   . Midsternal chest pain 03/03/2012  . Migraines   . Morbid obesity (HCC)   [place]13  . Myocardial infarction (HCC) 06[place]17  . Palpitations 02/17/2012  . Sleep apnea   . Ureteral stone with hydronephrosis   . Urinary tract obstruction due to kidney stone 02/15/2017     Allergies  Allergen Reactions  . Bee Venom Anaphylaxis and Swelling  . Latex Itching  . Sulfa Drugs Cross Reactors Nausea And Vomiting     Current Outpatient Medications  Medication Sig Dispense Refill  . ACCU-CHEK AVIVA PLUS test strip USE TO TEST BLOOD SUGAR FOUR TIMES DAILY 400 each 3  . aspirin 81 MG chewable tablet Chew 81 mg by mouth daily.    . benzonatate (TESSALON PERLES) 100 MG capsule Take 1 capsule (100 mg total) by mouth 3 (three) times  daily as needed. 20 capsule 0  . blood glucose meter kit and supplies Dispense based on patient and insurance preference. Use up to four times daily as directed. (FOR ICD-10:  E11.22). Check BG daily 1 each 0  . carvedilol (COREG) 25 MG tablet TAKE 1 TABLET BY MOUTH TWICE DAILY WITH A MEAL (Patient taking differently: Take 25 mg by mouth 2 (two) times daily. ) 60 tablet 6  . ibuprofen (ADVIL,MOTRIN) 200 MG tablet Take 400 mg by mouth every 8 (eight) hours as needed (for headaches/pain.).     . Insulin Pen Needle (NOVOFINE) 32G X 6 MM MISC 1 each by Does not apply route daily. 100 each 3  . losartan (COZAAR) 25 MG tablet Take 0.5 tablets (12.5 mg total) by mouth daily. 45 tablet 3  . medroxyPROGESTERone (PROVERA) 5  MG tablet Take 1 tablet (5 mg total) by mouth daily. 90 tablet 1  . metFORMIN (GLUCOPHAGE) 1000 MG tablet Take 1 tablet (1,000 mg total) by mouth 2 (two) times daily. 60 tablet 5  . nitroGLYCERIN (NITROSTAT) 0.4 MG SL tablet DISSOLVE 1 TABLET UNDER THE TONGUE EVERY 5 MINUTES AS NEEDED FOR CHEST PAIN. DO NOT EXCEED A TOTAL OF 3 DOSES IN 15 MINUTES. (Patient taking differently: Place 0.4 mg under the tongue every 5 (five) minutes x 3 doses as needed for chest pain. ) 25 tablet 3  . potassium chloride SA (K-DUR,KLOR-CON) 20 MEQ tablet Take 1 tablet (20 mEq total) by mouth daily. 90 tablet 3  . pregabalin (LYRICA) 75 MG capsule Take 1 capsule (75 mg total) by mouth 2 (two) times daily. 60 capsule 2  . ranolazine (RANEXA) 500 MG 12 hr tablet Take 1 tablet (500 mg total) by mouth 2 (two) times daily. 60 tablet 3  . rosuvastatin (CRESTOR) 40 MG tablet Take 1 tablet (40 mg total) by mouth daily. 90 tablet 3  . torsemide (DEMADEX) 20 MG tablet Take 40 mg ( 2 Tablets) in the AM and 60 mg (30 Tablets) in the PM (Patient taking differently: Take 40-60 mg by mouth See admin instructions. Take 40 mg ( 2 Tablets) in the AM and 60 mg (30 Tablets) in the PM) 150 tablet 6  . VICTOZA 18 MG/3ML SOPN inject 0.6-1.2 MG INTO THE SKIN EVERY DAY 6 mL 1   No current facility-administered medications for this visit.      Past Surgical History:  Procedure Laterality Date  . CARDIAC CATHETERIZATION  10/08/2011   LAD: 95% mid, Ramus: 95% ostial  . CARPAL TUNNEL RELEASE    . CESAREAN SECTION    . CHOLECYSTECTOMY    . CORONARY ANGIOPLASTY WITH STENT PLACEMENT  10/08/2011   LAD: BMS, Ramus: cutting balloon angioplasty  . CYSTOSCOPY WITH RETROGRADE PYELOGRAM, URETEROSCOPY AND STENT PLACEMENT Left 02/16/2017   Procedure: CYSTOSCOPY WITH RETROGRADE PYELOGRAM, URETEROSCOPY AND STENT PLACEMENT;  Surgeon: Kathie Rhodes, MD;  Location: [facility];  Service: Urology;  Laterality: Left;  . CYSTOSCOPY WITH RETROGRADE PYELOGRAM, URETEROSCOPY  AND STENT PLACEMENT Left 03/28/2017   Procedure: LEFT STENT REMOVAL LEFT RETROGRADE PYELOGRAM LEFT URETEROSCOPY   LASER LITHOTRIPSY  AND  STENT REPLACEMENT;  Surgeon: Cleon Gustin, MD;  Location: [facility];  Service: Urology;  Laterality: Left;  . LEFT HEART CATH AND CORONARY ANGIOGRAPHY N/A 08/15/2018   Procedure: LEFT HEART CATH AND CORONARY ANGIOGRAPHY;  Surgeon: Martinique, Peter M, MD;  Location: [facility] CV LAB;  Service: Cardiovascular;  Laterality: N/A;  . LEFT HEART CATHETERIZATION WITH CORONARY ANGIOGRAM N/A 10/08/2011  Procedure: LEFT HEART CATHETERIZATION WITH CORONARY ANGIOGRAM;  Surgeon: Peter M Martinique, MD;  Location: [facility] CATH LAB;  Service: Cardiovascular;  Laterality: N/A;  . LEFT HEART CATHETERIZATION WITH CORONARY ANGIOGRAM N/A 03/02/2012   Procedure: LEFT HEART CATHETERIZATION WITH CORONARY ANGIOGRAM;  Surgeon: Burnell Blanks, MD;  Location: [facility] CATH LAB;  Service: Cardiovascular;  Laterality: N/A;  . LEFT HEART CATHETERIZATION WITH CORONARY ANGIOGRAM N/A 01/04/2014   Procedure: LEFT HEART CATHETERIZATION WITH CORONARY ANGIOGRAM;  Surgeon: Troy Sine, MD;  Location: [facility] CATH LAB;  Service: Cardiovascular;  Laterality: N/A;     Allergies  Allergen Reactions  . Bee Venom Anaphylaxis and Swelling  . Latex Itching  . Sulfa Drugs Cross Reactors Nausea And Vomiting      Family History  Problem Relation Age of Onset  . Heart murmur Father   . Diabetes Father   . Hypertension Father   . Hyperlipidemia Father   . Cancer Father        throat  . Diabetes Mother   . Cancer Mother        breast.uterine, ovarian  . Asthma Son   . Appendicitis Son   . Hernia Son   . Mental illness Son        Bipolar, personality d/o  . Stroke Maternal Grandmother   . Cancer Maternal Grandmother        breast  . Cancer Paternal Grandfather        lung     Social History Ms. Hitchcock reports that she has been smoking cigarettes. She started smoking about 28 years ago. She has a  10.00 pack-year smoking history. She has never used smokeless tobacco. Ms. Enis reports current alcohol use.   Review of Systems CONSTITUTIONAL: No weight loss, fever, chills, weakness or fatigue.  HEENT: Eyes: No visual loss, blurred vision, double vision or yellow sclerae.No hearing loss, sneezing, congestion, runny nose or sore throat.  SKIN: No rash or itching.  CARDIOVASCULAR: per hpi RESPIRATORY: No shortness of breath, cough or sputum.  GASTROINTESTINAL: No anorexia, nausea, vomiting or diarrhea. No abdominal pain or blood.  GENITOURINARY: No burning on urination, no polyuria NEUROLOGICAL: No headache, dizziness, syncope, paralysis, ataxia, numbness or tingling in the extremities. No change in bowel or bladder control.  MUSCULOSKELETAL: No muscle, back pain, joint pain or stiffness.  LYMPHATICS: No enlarged nodes. No history of splenectomy.  PSYCHIATRIC: No history of depression or anxiety.  ENDOCRINOLOGIC: No reports of sweating, cold or heat intolerance. No polyuria or polydipsia.  Marland Kitchen   Physical Examination Vitals:   09/12/18 1300  BP: (!) 144/82  Pulse: 98  SpO2: 98%   Vitals:   09/12/18 1300  Weight: (!) 391 lb (177.4 kg)    Gen: resting comfortably, no acute distress HEENT: no scleral icterus, pupils equal round and reactive, no palptable cervical adenopathy,  CV: RRR, no m/r/g, no jvd Resp: Clear to auscultation bilaterally GI: abdomen is soft, non-tender, non-distended, normal bowel sounds, no hepatosplenomegaly MSK: extremities are warm, no edema.  Skin: warm, no rash Neuro:  no focal deficits Psych: appropriate affect   Diagnostic Studies 02/2012 Cath Hemodynamic Findings: Central aortic pressure: 108/70  Left ventricular pressure: 123/10/21  Angiographic Findings: Left main: No obstructive disease noted.  Left Anterior Descending Artery: Large caliber vessel that courses to the apex. There is a stent present in the mid vessel that is widely  patent with no restenosis. The remainder of the LAD is disease free. There is a moderate sized diagonal Steadman Prosperi with 30%  proximal stenosis.  Circumflex Artery: Dominant, large caliber vessel with no disease throughout the AV groove segment. There is small to moderate sized intermediate Adelbert Gaspard that has ostial 30% stenosis. The remainder of the Circumflex has no obstructive disease.  Right Coronary Artery: Small, non-dominant vessel. No disease noted.  Left Ventricular Angiogram: LVEF 50-55%.  Impression:  1. Double vessel CAD with patent stent mid LAD and patent angioplasty site at ostium of intermediate Dezarae Mcclaran.  2. Normal LV systolic function   12/5857 Cath PROCEDURAL FINDINGS  Hemodynamics:  AO 135/92 with a mean of 114 mmHg  LV 136/25 mmHg  Coronary angiography:  Coronary dominance: Left  Left mainstem: Normal.  Left anterior descending (LAD): There is a 95% stenosis in the mid LAD immediately after the takeoff of the first diagonal. The first diagonal Magali Bray is moderate in size and appears normal. The LAD stenosis does appear to be hazy.  There is a moderately large ramus intermediate Vanna Shavers which has a 95% ostial stenosis.  Left circumflex (LCx): This is a dominant vessel and appears normal throughout.  Right coronary artery (RCA): This is a small nondominant vessel and is normal.  Left ventriculography: Left ventricular systolic function is abnormal with apical hypokinesis. LVEF is estimated at 55%, there is no significant mitral regurgitation  PCI Note: Following the diagnostic procedure, the decision was made to proceed with PCI of the LAD. Weight-based bivalirudin was given for anticoagulation. Once a therapeutic ACT was achieved, a 5 [place] EBU guide catheter was inserted. A pro-water coronary guidewire was used to cross the lesion. The lesion was predilated with a 2.5 mm balloon. The lesion was then stented with a 3.0 x 18 mm vision stent. The stent was postdilated  with a 3.25 mm noncompliant balloon. Following PCI, there was 0% residual stenosis and TIMI-3 flow. At this point the patient's pain was improved but was Cease of moderate intensity. We then proceeded to intervene on the ostial ramus intermediate lesion. This was crossed with the prowater wire. This was dilated using a 3.0 x 10 mm cutting balloon performing 3 inflations to 6 atmospheres. This showed a good angiographic result with less than 30% residual stenosis and TIMI grade 3 flow. Final angiography confirmed an excellent result. The patient tolerated the procedure well. There were no immediate procedural complications. A TR band was used for radial hemostasis. The patient was transferred to the post catheterization recovery area for further monitoring.  PCI Data:  Vessel - LAD/Segment - mid vessel immediately after the takeoff of the first diagonal.  Percent Stenosis (pre) 95%  TIMI-flow 3.  Stent 3.0 x 18 mm vision stent.  Percent Stenosis (post) 0%  TIMI-flow (post) 3.  Second vessel: Ostial ramus intermediate Samarrah Tranchina  Percent stenosis: 95%.  TIMI flow 3.  3.0 mm cutting balloon  Percent stenosis (post) less than 30%  TIMI flow 3  Final Conclusions:  1. Severe 2 vessel obstructive coronary disease.  2. Overall well preserved LV systolic function with apical wall motion abnormality and ejection fraction of 55%.  3. Successful intracoronary stenting of the mid LAD with a bare-metal stent.  4. Successful cutting balloon angioplasty of the ramus intermediate Taylen Wendland.  Recommendations:  Continue therapy with Plavix. Patient reports a history of allergy to aspirin. Aggressive risk factor modification.  Disposition: Patient be observed in the intensive care unit tonight. If she has no complications she should be able to be transferred to telemetry in the morning.   12/2013 Cath HEMODYNAMICS:  Central Aorta: 100/60  Left Ventricle: 100/3  ANGIOGRAPHY:  1. Left  main: Normal and trifurcated into an LAD, ramus intermediate vessel, and a dominant left circumflex coronary artery  2. LAD: Leandrew Koyanagi gave rise to a proximal large first diagonal vessel. The LAD just after the diagonal vessel had a widely patent stent. The remainder of the LAD was free of significant disease and extended to the LV apex. 3. Ramus Intermediate: No evidence for restenosis at previous site of ostial cutting balloon intervention. 4. Left circumflex: Angiographically normal. Dominant vessel, which gave rise to 2 marginal branches and in the PDA.Marland Kitchen  4. Right coronary artery: Angiographically normal, nondominant vessel  Left ventriculography revealed global LV function. There was a suggestion of mild residual distal anterolateral hypocontractility. There was catheter-induced mitral regurgitation.  IMPRESSION:  Normal LV function with mild distal anterolateral, residual hypocontractility  No significant residual coronary obstructive disease with evidence for widely patent LAD stent, no evidence for restenosis of the ramus intermediate vessel, normal dominant left circumflex coronary artery and normal nondominant right coronary artery.    12/2013 MPI IMPRESSION: 1. Abnormal Lexiscan for ischemia  2. Large anteroseptal and apical area of ischemia  3. High risk study for major cardiac events based on area of myocardium at [facility]  4. Normal left ventricular systolic function, left ventricular ejection fraction 52%    Assessment and Plan   1. CAD with stable angina -recent chest pain symptoms, cath shows patent vessels other than small diag disease too small for intervention. Continue medical therapy.    2.Chronic diastolic HF - continue current diuretic, normal LVEDP by recent cath.       Arnoldo Lenis, M.D

## 2018-09-12 NOTE — Patient Instructions (Signed)

## 2018-09-15 ENCOUNTER — Ambulatory Visit: Payer: Medicaid Other | Admitting: Family Medicine

## 2018-09-26 ENCOUNTER — Other Ambulatory Visit: Payer: Self-pay | Admitting: Family Medicine

## 2018-09-26 ENCOUNTER — Other Ambulatory Visit: Payer: Self-pay | Admitting: Cardiology

## 2018-10-04 ENCOUNTER — Ambulatory Visit: Payer: Medicaid Other | Admitting: Family Medicine

## 2018-10-04 VITALS — BP 123/83 | HR 97 | Temp 99.2°F | Ht 70.0 in | Wt 393.0 lb

## 2018-10-04 DIAGNOSIS — N182 Chronic kidney disease, stage 2 (mild): Secondary | ICD-10-CM

## 2018-10-04 DIAGNOSIS — E1122 Type 2 diabetes mellitus with diabetic chronic kidney disease: Secondary | ICD-10-CM

## 2018-10-04 DIAGNOSIS — Z720 Tobacco use: Secondary | ICD-10-CM

## 2018-10-04 LAB — BAYER DCA HB A1C WAIVED: HB A1C (BAYER DCA - WAIVED): 6.6 % (ref <7.0)

## 2018-10-04 MED ORDER — PREGABALIN 75 MG PO CAPS: 75.0000 mg | ORAL_CAPSULE | Freq: Two times a day (BID) | ORAL | 4 refills | Status: DC

## 2018-10-04 MED ORDER — LIRAGLUTIDE 18 MG/3ML ~~LOC~~ SOPN: 1.2000 mg | PEN_INJECTOR | Freq: Every day | SUBCUTANEOUS | 0 refills | Status: DC

## 2018-10-04 NOTE — Progress Notes (Signed)
Subjective: CC: Type 2 diabetes PCP: Janora Norlander, DO FXT:[id number] D Poplin is a 38 y.o. female presenting to clinic today for:  1.  Type 2 diabetes w/ morbid obesity Patient was started on Victoza 3 months ago.  She has been keeping an eye on her blood sugars which she notes typically run 100s to 200s.  Occasionally she will have higher numbers in this but this seems to be related to what she is eating.  She is certainly no longer having blood sugars in the 5 and 600s which she is grateful for.  She does have a history of diabetic neuropathy.  She is on the Lyrica for this and states that it tends to work fairly well.  She is not had any hypoglycemic episodes since our last visit.  She continues with metformin as directed as well.  No chest pain, shortness of breath.  She is no longer having nausea related to the Victoza.  2.  Tobacco use disorder Patient reports that she continues to smoke but is down to half a pack per day.  Again, no chest pain, shortness of breath.  Her cardiologist recently started her on Ranexa which she states she is trying to get used to.  ROS: Per HPI  Allergies  Allergen Reactions  . Bee Venom Anaphylaxis and Swelling  . Latex Itching  . Sulfa Drugs Cross Reactors Nausea And Vomiting   Past Medical History:  Diagnosis Date  . Allergy   . Anemia   . Anxiety   . CHF (congestive heart failure) ([place])   . Chronic kidney disease    history of kidney stones  . Coronary artery disease 09/2011   a.s/p BMS to LAD and angioplasty of RI in 09/2011 b. patent stent by cath in 02/2012, 12/2013, and 02/2016 with most recent showing 30-40% dLAD and 30-40% RCA stenosis  . Depression   . Diabetes mellitus   . Hyperlipidemia   . Hypertension   . Midsternal chest pain 03/03/2012  . Migraines   . Morbid obesity ([place])   . Myocardial infarct ([place]) 09/2011  . Myocardial infarction ([place]) 02/2016  . Palpitations 02/17/2012  . Sleep apnea   . Ureteral stone with  hydronephrosis   . Urinary tract obstruction due to kidney stone 02/15/2017    Current Outpatient Medications:  .  ACCU-CHEK AVIVA PLUS test strip, USE TO TEST BLOOD SUGAR FOUR TIMES DAILY, Disp: 400 each, Rfl: 3 .  aspirin 81 MG chewable tablet, Chew 81 mg by mouth daily., Disp: , Rfl:  .  blood glucose meter kit and supplies, Dispense based on patient and insurance preference. Use up to four times daily as directed. (FOR ICD-10:  E11.22). Check BG daily, Disp: 1 each, Rfl: 0 .  carvedilol (COREG) 25 MG tablet, TAKE 1 TABLET BY MOUTH TWICE DAILY with a meal, Disp: 180 tablet, Rfl: 3 .  ibuprofen (ADVIL,MOTRIN) 200 MG tablet, Take 400 mg by mouth every 8 (eight) hours as needed (for headaches/pain.). , Disp: , Rfl:  .  Insulin Pen Needle (NOVOFINE) 32G X 6 MM MISC, 1 each by Does not apply route daily., Disp: 100 each, Rfl: 3 .  losartan (COZAAR) 25 MG tablet, Take 0.5 tablets (12.5 mg total) by mouth daily., Disp: 45 tablet, Rfl: 3 .  medroxyPROGESTERone (PROVERA) 5 MG tablet, Take 1 tablet (5 mg total) by mouth daily., Disp: 90 tablet, Rfl: 1 .  metFORMIN (GLUCOPHAGE) 1000 MG tablet, Take 1 tablet (1,000 mg total) by mouth 2 (two)  times daily., Disp: 60 tablet, Rfl: 5 .  nitroGLYCERIN (NITROSTAT) 0.4 MG SL tablet, DISSOLVE 1 TABLET UNDER THE TONGUE EVERY 5 MINUTES AS NEEDED FOR CHEST PAIN. DO NOT EXCEED A TOTAL OF 3 DOSES IN 15 MINUTES. (Patient taking differently: Place 0.4 mg under the tongue every 5 (five) minutes x 3 doses as needed for chest pain. ), Disp: 25 tablet, Rfl: 3 .  potassium chloride SA (K-DUR,KLOR-CON) 20 MEQ tablet, Take 1 tablet (20 mEq total) by mouth daily., Disp: 90 tablet, Rfl: 3 .  pregabalin (LYRICA) 75 MG capsule, TAKE ONE CAPSULE BY MOUTH TWICE DAILY, Disp: 60 capsule, Rfl: 0 .  ranolazine (RANEXA) 500 MG 12 hr tablet, Take 1 tablet (500 mg total) by mouth 2 (two) times daily., Disp: 60 tablet, Rfl: 3 .  rosuvastatin (CRESTOR) 40 MG tablet, Take 1 tablet (40 mg total)  by mouth daily., Disp: 90 tablet, Rfl: 3 .  torsemide (DEMADEX) 20 MG tablet, Take 40 mg ( 2 Tablets) in the AM and 60 mg (30 Tablets) in the PM (Patient taking differently: Take 40-60 mg by mouth See admin instructions. Take 40 mg ( 2 Tablets) in the AM and 60 mg (30 Tablets) in the PM), Disp: 150 tablet, Rfl: 6 .  VICTOZA 18 MG/3ML SOPN, inject 0.6-1.2 MG INTO THE SKIN EVERY DAY, Disp: 6 mL, Rfl: 1 Social History   Socioeconomic History  . Marital status: Married    Spouse name: Barnabas Lister  . Number of children: 2  . Years of education: 10  . Highest education level: Not on file  Occupational History  . Not on file  Social Needs  . Financial resource strain: Not hard at all  . Food insecurity:    Worry: Never true    Inability: Never true  . Transportation needs:    Medical: No    Non-medical: No  Tobacco Use  . Smoking status: Current Every Day Smoker    Packs/day: 0.50    Years: 20.00    Pack years: 10.00    Types: Cigarettes    Start date: 03/18/1990  . Smokeless tobacco: Never Used  Substance and Sexual Activity  . Alcohol use: Yes    Alcohol/week: 0.0 standard drinks    Comment: maybe once a year  . Drug use: No  . Sexual activity: Yes    Birth control/protection: None    Comment: PCOS  Lifestyle  . Physical activity:    Days per week: 0 days    Minutes per session: 0 min  . Stress: To some extent  Relationships  . Social connections:    Talks on phone: More than three times a week    Gets together: More than three times a week    Attends religious service: Never    Active member of club or organization: No    Attends meetings of clubs or organizations: Never    Relationship status: Married  . Intimate partner violence:    Fear of current or ex partner: No    Emotionally abused: No    Physically abused: No    Forced sexual activity: No  Other Topics Concern  . Not on file  Social History Narrative   Unemployed   Applied for disability   Leisure "take care  of house and kids"   Walks when able   Family History  Problem Relation Age of Onset  . Heart murmur Father   . Diabetes Father   . Hypertension Father   . Hyperlipidemia Father   .  Cancer Father        throat  . Diabetes Mother   . Cancer Mother        breast.uterine, ovarian  . Asthma Son   . Appendicitis Son   . Hernia Son   . Mental illness Son        Bipolar, personality d/o  . Stroke Maternal Grandmother   . Cancer Maternal Grandmother        breast  . Cancer Paternal Grandfather        lung    Objective: Office vital signs reviewed. BP 123/83   Pulse 97   Temp 99.2 F (37.3 C) (Oral)   Ht 5' 10"  (1.778 m)   Wt (!) 393 lb (178.3 kg)   BMI 56.39 kg/m   Physical Examination:  General: Awake, alert, morbidly obese, No acute distress HEENT: Normal    Neck: No masses palpated. No lymphadenopathy    Ears: Tympanic membranes intact, normal light reflex, no erythema, no bulging    Eyes: PERRLA, extraocular membranes intact, sclera white    Nose: nasal turbinates moist, clear nasal discharge    Throat: moist mucus membranes, mild oropharyngeal erythema, no tonsillar exudate.  Airway is patent Cardio: Regular rate and rhythm.  No murmurs.  S1-S2 heard. Pulm: Clear to auscultation bilaterally.  No rhonchi or rales.  Very mild intermittent wheeze noted.  Lab Results  Component Value Date   HGBA1C 7.8 (H) 06/26/2018    Assessment/ Plan: 38 y.o. female   1. Type 2 diabetes mellitus with stage 2 chronic kidney disease, without long-term current use of insulin (HCC) Her blood sugar is under good control today with an A1c of 6.6.  Continue metformin and Victoza.  Refills of Victoza have been sent to her pharmacy.  Continue to monitor blood sugars.  May follow-up in the next 3 to 6 months pending control at home.  She will be due for fasting labs at next visit. - Bayer DCA Hb A1c Waived  2. Morbid obesity ([place]) She is had a 7 pound weight loss since her last visit.   So far she is doing excellent on the Victoza and metformin.  3. Tobacco abuse In the active phase of cessation.  We will continue to assist patient as able.   Meds ordered this encounter  Medications  . pregabalin (LYRICA) 75 MG capsule    Sig: Take 1 capsule (75 mg total) by mouth 2 (two) times daily.    Dispense:  60 capsule    Refill:  4    Ok to add refills to rx sent 1/7  . liraglutide (VICTOZA) 18 MG/3ML SOPN    Sig: Inject 0.2 mLs (1.2 mg total) into the skin daily.    Dispense:  6 mL    Refill:  0   The Narcotic Database has been reviewed.  There were no red flags.     Janora Norlander, DO [place] [phone]

## 2018-10-05 ENCOUNTER — Encounter: Payer: Self-pay | Admitting: Family Medicine

## 2018-10-05 ENCOUNTER — Other Ambulatory Visit: Payer: Self-pay | Admitting: Cardiology

## 2018-11-01 ENCOUNTER — Other Ambulatory Visit: Payer: Self-pay | Admitting: Cardiology

## 2018-11-21 ENCOUNTER — Other Ambulatory Visit: Payer: Self-pay | Admitting: Family Medicine

## 2018-12-02 ENCOUNTER — Other Ambulatory Visit: Payer: Self-pay | Admitting: Family Medicine

## 2018-12-25 ENCOUNTER — Other Ambulatory Visit: Payer: Self-pay | Admitting: Student

## 2018-12-25 ENCOUNTER — Other Ambulatory Visit: Payer: Self-pay | Admitting: Family Medicine

## 2019-01-12 ENCOUNTER — Other Ambulatory Visit: Payer: Self-pay | Admitting: Family Medicine

## 2019-01-21 ENCOUNTER — Other Ambulatory Visit: Payer: Self-pay | Admitting: Student

## 2019-02-27 ENCOUNTER — Other Ambulatory Visit: Payer: Self-pay | Admitting: Family Medicine

## 2019-03-07 ENCOUNTER — Other Ambulatory Visit: Payer: Self-pay | Admitting: Student

## 2019-03-08 ENCOUNTER — Ambulatory Visit: Payer: Medicaid Other | Admitting: Cardiology

## 2019-03-13 ENCOUNTER — Ambulatory Visit: Payer: Medicaid Other | Admitting: Cardiology

## 2019-03-21 ENCOUNTER — Other Ambulatory Visit: Payer: Self-pay | Admitting: Family Medicine

## 2019-03-28 ENCOUNTER — Other Ambulatory Visit: Payer: Self-pay

## 2019-03-28 ENCOUNTER — Encounter: Payer: Self-pay | Admitting: Student

## 2019-03-28 ENCOUNTER — Ambulatory Visit (INDEPENDENT_AMBULATORY_CARE_PROVIDER_SITE_OTHER): Payer: Medicaid Other | Admitting: Student

## 2019-03-28 VITALS — BP 124/72 | HR 92 | Ht 70.0 in | Wt >= 6400 oz

## 2019-03-28 DIAGNOSIS — I25118 Atherosclerotic heart disease of native coronary artery with other forms of angina pectoris: Secondary | ICD-10-CM | POA: Diagnosis not present

## 2019-03-28 DIAGNOSIS — I10 Essential (primary) hypertension: Secondary | ICD-10-CM

## 2019-03-28 DIAGNOSIS — E1122 Type 2 diabetes mellitus with diabetic chronic kidney disease: Secondary | ICD-10-CM

## 2019-03-28 DIAGNOSIS — E785 Hyperlipidemia, unspecified: Secondary | ICD-10-CM

## 2019-03-28 DIAGNOSIS — Z720 Tobacco use: Secondary | ICD-10-CM

## 2019-03-28 DIAGNOSIS — I5032 Chronic diastolic (congestive) heart failure: Secondary | ICD-10-CM

## 2019-03-28 DIAGNOSIS — N182 Chronic kidney disease, stage 2 (mild): Secondary | ICD-10-CM

## 2019-03-28 NOTE — Progress Notes (Signed)
Cardiology Office Note    Date:  03/28/2019   ID:  [name], DOB [birth year], MRN [id number]  PCP:  Janora Norlander, DO  Cardiologist: Carlyle Dolly, MD    Chief Complaint  Patient presents with  . Follow-up    6 month visit    History of Present Illness:    [name] is a 38 y.o. female with past medical history of CAD (s/p BMS to LAD and angioplasty of RI in 2013, patent stent by repeat caths in 02/2012, 12/2013, and 02/2016, cath in 07/2018 showing patent mid-LAD stent with 99% D1 stenosis, 50% RCA, and 35% RI stenosis with medical management recommended as D1 was small in caliber), chronic diastolic CHF, HTN, HLD, Type 2 DM, and tobacco use who presents to the office today for an overdue follow-up visit.   She was last examined by Dr. Harl Bowie in 08/2018 following her recent catheterization which showed a patent stent with small vessel CAD as outlined above. Was [place] having intermittent episodes of chest pain but had not yet started Ranexa. Was continued on her current regimen at that time including ASA 62m daily, Coreg 227mBID, Losartan 12.39m33maily, and Crestor 57m19mily with instructions to start Ranexa as previously recommended.   In talking with the patient today, she denies any recurrent chest pain resembling her prior angina. Has occasional episodes of shooting pain lasting for seconds at a time but no exertional symptoms. Has not had to utilize SL NTG. Has baseline dyspnea on exertion and says this is worse in the hot temperatures. No specific orthopnea, PND, or edema. She is listed as taking Torsemide 57mg33mly but says she only takes this on an as-needed basis as her Victoza causes increased urination as well. Weight has increased by 10+ lbs on her home scales over the past few months but she says this has been gradual in onset and reports her dietary habits have worsened due to being home more frequently due to COVIDMindenhe has increased her tobacco  use, now smoking 0.5 - 1.0 ppd. Reports being under increased stress due to her children being at home and her husband's job situation as he is currently out of work and this is causing financial difficulty.   Past Medical History:  Diagnosis Date  . Allergy   . Anemia   . Anxiety   . CHF (congestive heart failure) (HCC) [place] Chronic kidney disease    history of kidney stones  . Coronary artery disease 09/2011   a.s/p BMS to LAD and angioplasty of RI in 09/2011 b. patent stent by cath in 02/2012, 12/2013, and 02/2016 with most recent showing 30-40% dLAD and 30-40% RCA stenosis  . Depression   . Diabetes mellitus   . Hyperlipidemia   . Hypertension   . Midsternal chest pain 03/03/2012  . Migraines   . Morbid obesity (HCC) [place] Myocardial infarct (HCC) [place]2013  . Myocardial infarction (HCC) [place]2017  . Palpitations 02/17/2012  . Sleep apnea   . Ureteral stone with hydronephrosis   . Urinary tract obstruction due to kidney stone 02/15/2017    Past Surgical History:  Procedure Laterality Date  . CARDIAC CATHETERIZATION  10/08/2011   LAD: 95% mid, Ramus: 95% ostial  . CARPAL TUNNEL RELEASE    . CESAREAN SECTION    . CHOLECYSTECTOMY    . CORONARY ANGIOPLASTY WITH STENT PLACEMENT  10/08/2011   LAD: BMS, Ramus: cutting balloon angioplasty  . CYSTOSCOPY WITH  RETROGRADE PYELOGRAM, URETEROSCOPY AND STENT PLACEMENT Left 02/16/2017   Procedure: CYSTOSCOPY WITH RETROGRADE PYELOGRAM, URETEROSCOPY AND STENT PLACEMENT;  Surgeon: Kathie Rhodes, MD;  Location: [facility];  Service: Urology;  Laterality: Left;  . CYSTOSCOPY WITH RETROGRADE PYELOGRAM, URETEROSCOPY AND STENT PLACEMENT Left 03/28/2017   Procedure: LEFT STENT REMOVAL LEFT RETROGRADE PYELOGRAM LEFT URETEROSCOPY   LASER LITHOTRIPSY  AND  STENT REPLACEMENT;  Surgeon: Cleon Gustin, MD;  Location: [facility];  Service: Urology;  Laterality: Left;  . LEFT HEART CATH AND CORONARY ANGIOGRAPHY N/A 08/15/2018   Procedure: LEFT HEART CATH AND CORONARY  ANGIOGRAPHY;  Surgeon: Martinique, Peter M, MD;  Location: [place] CV LAB;  Service: Cardiovascular;  Laterality: N/A;  . LEFT HEART CATHETERIZATION WITH CORONARY ANGIOGRAM N/A 10/08/2011   Procedure: LEFT HEART CATHETERIZATION WITH CORONARY ANGIOGRAM;  Surgeon: Peter M Martinique, MD;  Location: [facility] CATH LAB;  Service: Cardiovascular;  Laterality: N/A;  . LEFT HEART CATHETERIZATION WITH CORONARY ANGIOGRAM N/A 03/02/2012   Procedure: LEFT HEART CATHETERIZATION WITH CORONARY ANGIOGRAM;  Surgeon: Burnell Blanks, MD;  Location: [facility] CATH LAB;  Service: Cardiovascular;  Laterality: N/A;  . LEFT HEART CATHETERIZATION WITH CORONARY ANGIOGRAM N/A 01/04/2014   Procedure: LEFT HEART CATHETERIZATION WITH CORONARY ANGIOGRAM;  Surgeon: Troy Sine, MD;  Location: [facility] CATH LAB;  Service: Cardiovascular;  Laterality: N/A;    Current Medications: Outpatient Medications Prior to Visit  Medication Sig Dispense Refill  . ACCU-CHEK AVIVA PLUS test strip USE TO TEST BLOOD SUGAR FOUR TIMES DAILY 400 each 3  . aspirin 81 MG chewable tablet Chew 81 mg by mouth daily.    . blood glucose meter kit and supplies Dispense based on patient and insurance preference. Use up to four times daily as directed. (FOR ICD-10:  E11.22). Check BG daily 1 each 0  . carvedilol (COREG) 25 MG tablet TAKE 1 TABLET BY MOUTH TWICE DAILY with a meal 180 tablet 3  . ibuprofen (ADVIL,MOTRIN) 200 MG tablet Take 400 mg by mouth every 8 (eight) hours as needed (for headaches/pain.).     . Insulin Pen Needle (NOVOFINE) 32G X 6 MM MISC 1 each by Does not apply route daily. 100 each 3  . losartan (COZAAR) 25 MG tablet TAKE 1/2 TABLET BY MOUTH EVERY DAY 45 tablet 3  . medroxyPROGESTERone (PROVERA) 5 MG tablet TAKE 1 TABLET BY MOUTH DAILY 90 tablet 0  . metFORMIN (GLUCOPHAGE) 1000 MG tablet TAKE 1 TABLET BY MOUTH TWICE DAILY 60 tablet 5  . nitroGLYCERIN (NITROSTAT) 0.4 MG SL tablet DISSOLVE 1 TABLET UNDER THE TONGUE EVERY 5 MINUTES AS NEEDED FOR CHEST  PAIN. DO NOT EXCEED A TOTAL OF 3 DOSES IN 15 MINUTES. (Patient taking differently: Place 0.4 mg under the tongue every 5 (five) minutes x 3 doses as needed for chest pain. ) 25 tablet 3  . potassium chloride SA (K-DUR,KLOR-CON) 20 MEQ tablet TAKE 1 TABLET BY MOUTH EVERY DAY 90 tablet 3  . pregabalin (LYRICA) 75 MG capsule Take 1 capsule (75 mg total) by mouth 2 (two) times daily. 60 capsule 4  . RANEXA 500 MG 12 hr tablet TAKE 1 TABLET BY MOUTH TWICE DAILY 180 tablet 1  . rosuvastatin (CRESTOR) 40 MG tablet TAKE ONE TABLET BY MOUTH DAILY 90 tablet 3  . torsemide (DEMADEX) 20 MG tablet Take 40 mg by mouth daily.    Marland Kitchen VICTOZA 18 MG/3ML SOPN INJECT 1.2 MG INTO THE SKIN EVERY DAY 6 mL 0  . torsemide (DEMADEX) 20 MG tablet TAKE TWO TABLETS  BY MOUTH EVERY MORNING and TAKE THREE TABLETS EVERY EVENING (Patient taking differently: TAKE TWO TABLETS BY MOUTH EVERY MORNING) 150 tablet 3   No facility-administered medications prior to visit.      Allergies:   Bee venom, Latex, and Sulfa drugs cross reactors   Social History   Socioeconomic History  . Marital status: Married    Spouse name: Barnabas Lister  . Number of children: 2  . Years of education: 10  . Highest education level: Not on file  Occupational History  . Not on file  Social Needs  . Financial resource strain: Not hard at all  . Food insecurity    Worry: Never true    Inability: Never true  . Transportation needs    Medical: No    Non-medical: No  Tobacco Use  . Smoking status: Current Every Day Smoker    Packs/day: 0.50    Years: 20.00    Pack years: 10.00    Types: Cigarettes    Start date: 03/18/1990  . Smokeless tobacco: Never Used  Substance and Sexual Activity  . Alcohol use: Yes    Alcohol/week: 0.0 standard drinks    Comment: maybe once a year  . Drug use: No  . Sexual activity: Yes    Birth control/protection: None    Comment: PCOS  Lifestyle  . Physical activity    Days per week: 0 days    Minutes per session: 0 min   . Stress: To some extent  Relationships  . Social connections    Talks on phone: More than three times a week    Gets together: More than three times a week    Attends religious service: Never    Active member of club or organization: No    Attends meetings of clubs or organizations: Never    Relationship status: Married  Other Topics Concern  . Not on file  Social History Narrative   Unemployed   Applied for disability   Leisure "take care of house and kids"   Walks when able     Family History:  The patient's family history includes Appendicitis in her son; Asthma in her son; Cancer in her father, maternal grandmother, mother, and paternal grandfather; Diabetes in her father and mother; Heart murmur in her father; Hernia in her son; Hyperlipidemia in her father; Hypertension in her father; Mental illness in her son; Stroke in her maternal grandmother.   Review of Systems:   Please see the history of present illness.     General:  No chills, fever, night sweats or weight changes.  Cardiovascular:  No chest pain, orthopnea, palpitations, paroxysmal nocturnal dyspnea. Positive for edema and dyspnea on exertion.  Dermatological: No rash, lesions/masses Respiratory: No cough, dyspnea Urologic: No hematuria, dysuria Abdominal:   No nausea, vomiting, diarrhea, bright red blood per rectum, melena, or hematemesis Neurologic:  No visual changes, wkns, changes in mental status. All other systems reviewed and are otherwise negative except as noted above.   Physical Exam:    VS:  BP 124/72   Pulse 92   Ht 5' 10"  (1.778 m)   Wt (!) 404 lb (183.3 kg)   BMI 57.97 kg/m    General: Well developed, obese female appearing in no acute distress. Head: Normocephalic, atraumatic, sclera non-icteric, no xanthomas, nares are without discharge.  Neck: No carotid bruits. JVD not elevated.  Lungs: Respirations regular and unlabored, without wheezes or rales.  Heart: Regular rate and rhythm. No S3  or S4.  No murmur, no rubs, or gallops appreciated. Abdomen: Soft, non-tender, non-distended with normoactive bowel sounds. No hepatomegaly. No rebound/guarding. No obvious abdominal masses. Msk:  Strength and tone appear normal for age. No joint deformities or effusions. Extremities: No clubbing or cyanosis. Trace ankle edema bilaterally.  Distal pedal pulses are 2+ bilaterally. Neuro: Alert and oriented X 3. Moves all extremities spontaneously. No focal deficits noted. Psych:  Responds to questions appropriately with a normal affect. Skin: No rashes or lesions noted  Wt Readings from Last 3 Encounters:  03/28/19 (!) 404 lb (183.3 kg)  10/04/18 (!) 393 lb (178.3 kg)  09/12/18 (!) 391 lb (177.4 kg)     Studies/Labs Reviewed:   EKG:  EKG is not ordered today.   Recent Labs: 06/05/2018: Brain Natriuretic Peptide 16; Magnesium 1.9 08/14/2018: BUN 12; Creatinine, Ser 0.74; Hemoglobin 14.8; Platelets 307; Potassium 3.5; Sodium 135   Lipid Panel    Component Value Date/Time   CHOL 186 10/28/2017 1221   TRIG 216 (H) 10/28/2017 1221   HDL 30 (L) 10/28/2017 1221   CHOLHDL 6.2 (H) 10/28/2017 1221   VLDL 32 03/02/2012 0223   LDLCALC 122 (H) 10/28/2017 1221    Additional studies/ records that were reviewed today include:   Cardiac Catheterization: 07/2018  Prox LAD lesion is 30% stenosed.  Ost 1st Diag to 1st Diag lesion is 99% stenosed.  Ost Ramus to Ramus lesion is 35% stenosed.  Prox RCA to Dist RCA lesion is 50% stenosed.  There is mild left ventricular systolic dysfunction.  LV end diastolic pressure is normal.  The left ventricular ejection fraction is 45-50% by visual estimate.   1. Single vessel obstructive CAD    - 99% ostial first diagonal. This vessel appears small in caliber.    - Patent stent in the mid LAD    - 35% ramus intermediate    - the RCA is a small nondominant vessel with diffuse 50% stenosis 2. Mild LV dysfunction with anterolateral HK. EF estimated  at 45-50% 3. Normal LVEDP  Plan: recommend medical management. The diagonal appears too small for PCI.  Recommend Aspirin 5m daily for moderate CAD.  Assessment:    1. Coronary artery disease of native artery of native heart with stable angina pectoris ([place]   2. Chronic diastolic heart failure ([place]   3. Essential hypertension   4. Hyperlipidemia LDL goal <70   5. Type 2 diabetes mellitus with stage 2 chronic kidney disease, without long-term current use of insulin ([facility]   6. Tobacco abuse      Plan:   In order of problems listed above:  1. CAD - s/p BMS to LAD and angioplasty of RI in 2013, patent stent by repeat caths in 02/2012, 12/2013, and 02/2016, cath in 07/2018 showing patent mid-LAD stent with 99% D1 stenosis, 50% RCA, and 35% RI stenosis with medical management recommended as D1 was small in caliber for intervention.  - denies any recurrent anginal symptoms.  - continue medical therapy with ASA, BB, statin, and Ranexa (previously intolerant to Imdur). We reviewed Ranexa could be titrated in the future if needed but she reports consuming grapefruit juice on a daily basis so if ever adjusting the dose from 5075mBID to 100060mID, she would need to limit her consumption of this.   2. Chronic Diastolic CHF - has dyspnea on exertion at baseline but denies any orthopnea, PND, or edema. Assessment of fluid status limited in the setting of her body habitus but she appears close  to euvolemic.  - continue with Torsemide 41m daily. Importance of following daily weights and limiting sodium consumption reviewed with the patient.   3. HTN - BP well-controlled at 124/72 during today's visit. Continue current regimen with Coreg 246mBID and Losartan 12.76m83maily.   4. HLD - followed by PCP. Due for repeat FLP and LFT's with annual labs. Remains on Crestor 64m43mily with goal LDL less than 70 with known CAD.   5. Type 2 DM - followed by PCP. Hgb A1c at 6.6 when checked in  09/2018.  6. Tobacco Use  - currently smoking 0.5 - 1.0 ppd. Reduction with cessation advised. Reports being unable to quit at this time due to her current stress level as described above.    Medication Adjustments/Labs and Tests Ordered: Current medicines are reviewed at length with the patient today.  Concerns regarding medicines are outlined above.  Medication changes, Labs and Tests ordered today are listed in the Patient Instructions below. Patient Instructions  Medication Instructions:  Your physician recommends that you continue on your current medications as directed. Please refer to the Current Medication list given to you today.  If you need a refill on your cardiac medications before your next appointment, please call your pharmacy.   Lab work: NONE   If you have labs (blood work) drawn today and your tests are completely normal, you will receive your results only by: . MyMarland Kitchenhart Message (if you have MyChart) OR . A paper copy in the mail If you have any lab test that is abnormal or we need to change your treatment, we will call you to review the results.  Testing/Procedures: NONE   Follow-Up: [facility] and your health needs are our priority.  As part of our continuing mission to provide you with exceptional heart care, we have created designated Provider Care Teams.  These Care Teams include your primary Cardiologist (physician) and Advanced Practice Providers (APPs -  Physician Assistants and Nurse Practitioners) who all work together to provide you with the care you need, when you need it. You will need a follow up appointment in 6 months.  Please call our office 2 months in advance to schedule this appointment.  You may see BranCarlyle Dolly or one of the following Advanced Practice Providers on your designated Care Team:   BritBernerd Pho-C ([facility] LLCMichErmalinda Barrios-C ([place] Other Special Instructions Will Be Listed  Below (If Applicable). Thank you for choosing [facility]     Signed, BritErma Heritage-C  03/28/2019 7:48 PM    [facility] [place] [id number]: [phone]

## 2019-03-28 NOTE — Patient Instructions (Signed)
Medication Instructions:  Your physician recommends that you continue on your current medications as directed. Please refer to the Current Medication list given to you today.  If you need a refill on your cardiac medications before your next appointment, please call your pharmacy.   Lab work: NONE   If you have labs (blood work) drawn today and your tests are completely normal, you will receive your results only by: . MyChart Message (if you have MyChart) OR . A paper copy in the mail If you have any lab test that is abnormal or we need to change your treatment, we will call you to review the results.  Testing/Procedures: NONE   Follow-Up: At [facility], you and your health needs are our priority.  As part of our continuing mission to provide you with exceptional heart care, we have created designated Provider Care Teams.  These Care Teams include your primary Cardiologist (physician) and Advanced Practice Providers (APPs -  Physician Assistants and Nurse Practitioners) who all work together to provide you with the care you need, when you need it. You will need a follow up appointment in 6 months.  Please call our office 2 months in advance to schedule this appointment.  You may see Branch, Jonathan, MD or one of the following Advanced Practice Providers on your designated Care Team:   Brittany Strader, PA-C ([place] Office) . Michele Lenze, PA-C ([place] Office)  Any Other Special Instructions Will Be Listed Below (If Applicable). Thank you for choosing [facility]!     

## 2019-04-11 ENCOUNTER — Ambulatory Visit (INDEPENDENT_AMBULATORY_CARE_PROVIDER_SITE_OTHER): Payer: Medicaid Other | Admitting: Family Medicine

## 2019-04-11 DIAGNOSIS — E1122 Type 2 diabetes mellitus with diabetic chronic kidney disease: Secondary | ICD-10-CM | POA: Diagnosis not present

## 2019-04-11 DIAGNOSIS — N182 Chronic kidney disease, stage 2 (mild): Secondary | ICD-10-CM | POA: Diagnosis not present

## 2019-04-11 MED ORDER — PREGABALIN 75 MG PO CAPS: 75.0000 mg | ORAL_CAPSULE | Freq: Two times a day (BID) | ORAL | 4 refills | Status: DC

## 2019-04-11 MED ORDER — METFORMIN HCL 1000 MG PO TABS: 1000.0000 mg | ORAL_TABLET | Freq: Two times a day (BID) | ORAL | 5 refills | Status: DC

## 2019-04-11 MED ORDER — VICTOZA 18 MG/3ML ~~LOC~~ SOPN: PEN_INJECTOR | SUBCUTANEOUS | 3 refills | Status: DC

## 2019-04-11 NOTE — Progress Notes (Signed)
Telephone visit  Subjective: CC: DM2 PCP: Janora Norlander, DO [name] is a 38 y.o. female calls for telephone consult today. Patient provides verbal consent for consult held via phone.  Location of patient: home Location of provider: Working remotely from home Others present for call: none  1. Type 2 Diabetes:  Patient reports fasting blood sugars have been around 1 10-1 15.  She does have occasional elevations in blood sugars greater than 200 but this typically is associated with certain foods.  She is compliant with both Trulicity and metformin.  She takes Lyrica for neuropathy.  She is overdue for diabetic foot exam. Last A1c:  Lab Results  Component Value Date   HGBA1C 6.6 10/04/2018   Last flu, zoster and/or pneumovax:  Immunization History  Administered Date(s) Administered  . Influenza Split 10/10/2011  . Influenza,inj,Quad PF,6+ Mos 10/28/2017, 06/26/2018  . Pneumococcal Conjugate-13 10/28/2017  . Pneumococcal Polysaccharide-23 10/10/2011  . Tdap 01/25/2018    She reports the neuropathy in her feet and legs is somewhat uncontrolled.  She takes the Lyrica twice daily as prescribed.  No excessive sedation, falls or respiratory depression.    ROS: Per HPI  Allergies  Allergen Reactions  . Bee Venom Anaphylaxis and Swelling  . Latex Itching  . Sulfa Drugs Cross Reactors Nausea And Vomiting   Past Medical History:  Diagnosis Date  . Allergy   . Anemia   . Anxiety   . CHF (congestive heart failure) ([place])   . Chronic kidney disease    history of kidney stones  . Coronary artery disease 09/2011   a.s/p BMS to LAD and angioplasty of RI in 09/2011 b. patent stent by cath in 02/2012, 12/2013, and 02/2016 with most recent showing 30-40% dLAD and 30-40% RCA stenosis  . Depression   . Diabetes mellitus   . Hyperlipidemia   . Hypertension   . Midsternal chest pain 03/03/2012  . Migraines   . Morbid obesity ([place])   . Myocardial infarct ([place]) 09/2011   . Myocardial infarction ([place]) 02/2016  . Palpitations 02/17/2012  . Sleep apnea   . Ureteral stone with hydronephrosis   . Urinary tract obstruction due to kidney stone 02/15/2017    Current Outpatient Medications:  .  ACCU-CHEK AVIVA PLUS test strip, USE TO TEST BLOOD SUGAR FOUR TIMES DAILY, Disp: 400 each, Rfl: 3 .  aspirin 81 MG chewable tablet, Chew 81 mg by mouth daily., Disp: , Rfl:  .  blood glucose meter kit and supplies, Dispense based on patient and insurance preference. Use up to four times daily as directed. (FOR ICD-10:  E11.22). Check BG daily, Disp: 1 each, Rfl: 0 .  carvedilol (COREG) 25 MG tablet, TAKE 1 TABLET BY MOUTH TWICE DAILY with a meal, Disp: 180 tablet, Rfl: 3 .  ibuprofen (ADVIL,MOTRIN) 200 MG tablet, Take 400 mg by mouth every 8 (eight) hours as needed (for headaches/pain.). , Disp: , Rfl:  .  Insulin Pen Needle (NOVOFINE) 32G X 6 MM MISC, 1 each by Does not apply route daily., Disp: 100 each, Rfl: 3 .  losartan (COZAAR) 25 MG tablet, TAKE 1/2 TABLET BY MOUTH EVERY DAY, Disp: 45 tablet, Rfl: 3 .  medroxyPROGESTERone (PROVERA) 5 MG tablet, TAKE 1 TABLET BY MOUTH DAILY, Disp: 90 tablet, Rfl: 0 .  metFORMIN (GLUCOPHAGE) 1000 MG tablet, TAKE 1 TABLET BY MOUTH TWICE DAILY, Disp: 60 tablet, Rfl: 5 .  nitroGLYCERIN (NITROSTAT) 0.4 MG SL tablet, DISSOLVE 1 TABLET UNDER THE TONGUE EVERY 5 MINUTES  AS NEEDED FOR CHEST PAIN. DO NOT EXCEED A TOTAL OF 3 DOSES IN 15 MINUTES. (Patient taking differently: Place 0.4 mg under the tongue every 5 (five) minutes x 3 doses as needed for chest pain. ), Disp: 25 tablet, Rfl: 3 .  potassium chloride SA (K-DUR,KLOR-CON) 20 MEQ tablet, TAKE 1 TABLET BY MOUTH EVERY DAY, Disp: 90 tablet, Rfl: 3 .  pregabalin (LYRICA) 75 MG capsule, Take 1 capsule (75 mg total) by mouth 2 (two) times daily., Disp: 60 capsule, Rfl: 4 .  RANEXA 500 MG 12 hr tablet, TAKE 1 TABLET BY MOUTH TWICE DAILY, Disp: 180 tablet, Rfl: 1 .  rosuvastatin (CRESTOR) 40 MG tablet,  TAKE ONE TABLET BY MOUTH DAILY, Disp: 90 tablet, Rfl: 3 .  torsemide (DEMADEX) 20 MG tablet, Take 40 mg by mouth daily., Disp: , Rfl:  .  VICTOZA 18 MG/3ML SOPN, INJECT 1.2 MG INTO THE SKIN EVERY DAY, Disp: 6 mL, Rfl: 0  Assessment/ Plan: 38 y.o. female   1. Type 2 diabetes mellitus with stage 2 chronic kidney disease, without long-term current use of insulin ([place]) Plan for A1c at next visit.  This is been scheduled in 1 month.  Patient aware of date and time.  Plan for increased dose of Lyrica at that visit.  Will need UDS and controlled substance contract renewal as well.  Plan for diabetic foot exam. - metFORMIN (GLUCOPHAGE) 1000 MG tablet; Take 1 tablet (1,000 mg total) by mouth 2 (two) times daily.  Dispense: 60 tablet; Refill: 5 - liraglutide (VICTOZA) 18 MG/3ML SOPN; INJECT 1.2 MG INTO THE SKIN EVERY DAY  Dispense: 6 mL; Refill: 3 - pregabalin (LYRICA) 75 MG capsule; Take 1 capsule (75 mg total) by mouth 2 (two) times daily.  Dispense: 60 capsule; Refill: 4   Start time: 2:57pm End time: 3:05pm  Total time spent on patient care (including telephone call/ virtual visit): 14 minutes  [place], [place] [phone]

## 2019-04-12 ENCOUNTER — Other Ambulatory Visit: Payer: Self-pay | Admitting: Family Medicine

## 2019-05-18 ENCOUNTER — Ambulatory Visit: Payer: Self-pay | Admitting: Family Medicine

## 2019-06-05 ENCOUNTER — Other Ambulatory Visit: Payer: Self-pay

## 2019-06-05 ENCOUNTER — Ambulatory Visit: Payer: Medicaid Other | Admitting: Family Medicine

## 2019-06-05 ENCOUNTER — Encounter: Payer: Self-pay | Admitting: Family Medicine

## 2019-06-05 VITALS — BP 122/79 | HR 102 | Temp 98.6°F | Ht 70.0 in | Wt >= 6400 oz

## 2019-06-05 DIAGNOSIS — Z79899 Other long term (current) drug therapy: Secondary | ICD-10-CM | POA: Diagnosis not present

## 2019-06-05 DIAGNOSIS — I152 Hypertension secondary to endocrine disorders: Secondary | ICD-10-CM

## 2019-06-05 DIAGNOSIS — D72829 Elevated white blood cell count, unspecified: Secondary | ICD-10-CM

## 2019-06-05 DIAGNOSIS — N182 Chronic kidney disease, stage 2 (mild): Secondary | ICD-10-CM

## 2019-06-05 DIAGNOSIS — E1140 Type 2 diabetes mellitus with diabetic neuropathy, unspecified: Secondary | ICD-10-CM

## 2019-06-05 DIAGNOSIS — I10 Essential (primary) hypertension: Secondary | ICD-10-CM | POA: Diagnosis not present

## 2019-06-05 DIAGNOSIS — Z23 Encounter for immunization: Secondary | ICD-10-CM

## 2019-06-05 DIAGNOSIS — E1159 Type 2 diabetes mellitus with other circulatory complications: Secondary | ICD-10-CM

## 2019-06-05 DIAGNOSIS — F411 Generalized anxiety disorder: Secondary | ICD-10-CM

## 2019-06-05 DIAGNOSIS — E785 Hyperlipidemia, unspecified: Secondary | ICD-10-CM | POA: Diagnosis not present

## 2019-06-05 DIAGNOSIS — E1122 Type 2 diabetes mellitus with diabetic chronic kidney disease: Secondary | ICD-10-CM | POA: Diagnosis not present

## 2019-06-05 DIAGNOSIS — E1169 Type 2 diabetes mellitus with other specified complication: Secondary | ICD-10-CM | POA: Diagnosis not present

## 2019-06-05 LAB — BAYER DCA HB A1C WAIVED: HB A1C (BAYER DCA - WAIVED): 7.3 % — ABNORMAL HIGH (ref <7.0)

## 2019-06-05 MED ORDER — FLUOXETINE HCL 20 MG PO CAPS: 20.0000 mg | ORAL_CAPSULE | Freq: Every day | ORAL | 1 refills | Status: DC

## 2019-06-05 MED ORDER — PREGABALIN 100 MG PO CAPS: 100.0000 mg | ORAL_CAPSULE | Freq: Two times a day (BID) | ORAL | 2 refills | Status: DC

## 2019-06-05 NOTE — Progress Notes (Signed)
Subjective: CC: DM, HTN, HLD PCP: Janora Norlander, DO HBZ:JIRCVELFY D [name] is a 38 y.o. female presenting to clinic today for:  1. Type 2 Diabetes w. Neuropathy, HTN/ HLD and hx MI:  Patient reports: High at home: 200s; Low at home: 120s.  FBG tends to be around 120s, Taking medication(s): metformin, Victoza, cozaar, crestor, coreg, torsemide.  Last eye exam: Due Last foot exam: Due Last A1c:  Lab Results  Component Value Date   HGBA1C 6.6 10/04/2018   Nephropathy screen indicated?: on ARB Last flu, zoster and/or pneumovax:  Immunization History  Administered Date(s) Administered  . Influenza Split 10/10/2011  . Influenza,inj,Quad PF,6+ Mos 10/28/2017, 06/26/2018  . Pneumococcal Conjugate-13 10/28/2017  . Pneumococcal Polysaccharide-23 10/10/2011  . Tdap 01/25/2018    ROS: Denies dizziness, LOC, polyuria, polydipsia, unintended weight loss/gain, foot ulcerations.  She has chronic numbness or tingling in extremities (this is somewhat worse over the last couple of months), shortness of breath and chest pain (these are unchanged).  She reports "diabetic blisters in her hands".  2. Anxiety Patient reports long standing history of panic/ anxiety disorder.  She was previously treated with Vistaril but she notes that she discontinue this because it made her too sleepy.  She is also been on low-dose benzodiazepine but notes that this was not effective.  She is never been on an SSRI to her knowledge but would like to start something because the anxiety seems to be getting worse given the COVID-19 restrictions and need to be homebound.  She notes that she was paralyzed with anxiety recently in the shower and her husband had to assist her getting out.  Since then she placed a shower chair in her tub in case she needs it.  ROS: Per HPI  Allergies  Allergen Reactions  . Bee Venom Anaphylaxis and Swelling  . Latex Itching  . Sulfa Drugs Cross Reactors Nausea And Vomiting   Past  Medical History:  Diagnosis Date  . Allergy   . Anemia   . Anxiety   . CHF (congestive heart failure) ([place])   . Chronic kidney disease    history of kidney stones  . Coronary artery disease 09/2011   a.s/p BMS to LAD and angioplasty of RI in 09/2011 b. patent stent by cath in 02/2012, 12/2013, and 02/2016 with most recent showing 30-40% dLAD and 30-40% RCA stenosis  . Depression   . Diabetes mellitus   . Hyperlipidemia   . Hypertension   . Midsternal chest pain 03/03/2012  . Migraines   . Morbid obesity ([place])   . Myocardial infarct ([place]) 09/2011  . Myocardial infarction ([place]) 02/2016  . Palpitations 02/17/2012  . Sleep apnea   . Ureteral stone with hydronephrosis   . Urinary tract obstruction due to kidney stone 02/15/2017    Current Outpatient Medications:  .  ACCU-CHEK AVIVA PLUS test strip, USE TO TEST BLOOD SUGAR FOUR TIMES DAILY, Disp: 400 each, Rfl: 3 .  aspirin 81 MG chewable tablet, Chew 81 mg by mouth daily., Disp: , Rfl:  .  blood glucose meter kit and supplies, Dispense based on patient and insurance preference. Use up to four times daily as directed. (FOR ICD-10:  E11.22). Check BG daily, Disp: 1 each, Rfl: 0 .  carvedilol (COREG) 25 MG tablet, TAKE 1 TABLET BY MOUTH TWICE DAILY with a meal, Disp: 180 tablet, Rfl: 3 .  ibuprofen (ADVIL,MOTRIN) 200 MG tablet, Take 400 mg by mouth every 8 (eight) hours as needed (for headaches/pain.). ,  Disp: , Rfl:  .  Insulin Pen Needle (NOVOFINE) 32G X 6 MM MISC, 1 each by Does not apply route daily., Disp: 100 each, Rfl: 3 .  liraglutide (VICTOZA) 18 MG/3ML SOPN, INJECT 1.2 MG INTO THE SKIN EVERY DAY, Disp: 6 mL, Rfl: 3 .  losartan (COZAAR) 25 MG tablet, TAKE 1/2 TABLET BY MOUTH EVERY DAY, Disp: 45 tablet, Rfl: 3 .  medroxyPROGESTERone (PROVERA) 5 MG tablet, TAKE 1 TABLET BY MOUTH DAILY, Disp: 90 tablet, Rfl: 0 .  metFORMIN (GLUCOPHAGE) 1000 MG tablet, Take 1 tablet (1,000 mg total) by mouth 2 (two) times daily., Disp: 60 tablet, Rfl: 5  .  nitroGLYCERIN (NITROSTAT) 0.4 MG SL tablet, DISSOLVE 1 TABLET UNDER THE TONGUE EVERY 5 MINUTES AS NEEDED FOR CHEST PAIN. DO NOT EXCEED A TOTAL OF 3 DOSES IN 15 MINUTES. (Patient taking differently: Place 0.4 mg under the tongue every 5 (five) minutes x 3 doses as needed for chest pain. ), Disp: 25 tablet, Rfl: 3 .  potassium chloride SA (K-DUR,KLOR-CON) 20 MEQ tablet, TAKE 1 TABLET BY MOUTH EVERY DAY, Disp: 90 tablet, Rfl: 3 .  pregabalin (LYRICA) 75 MG capsule, Take 1 capsule (75 mg total) by mouth 2 (two) times daily., Disp: 60 capsule, Rfl: 4 .  RANEXA 500 MG 12 hr tablet, TAKE 1 TABLET BY MOUTH TWICE DAILY, Disp: 180 tablet, Rfl: 1 .  rosuvastatin (CRESTOR) 40 MG tablet, TAKE ONE TABLET BY MOUTH DAILY, Disp: 90 tablet, Rfl: 3 .  torsemide (DEMADEX) 20 MG tablet, Take 40 mg by mouth daily., Disp: , Rfl:  Social History   Socioeconomic History  . Marital status: Married    Spouse name: Barnabas Lister  . Number of children: 2  . Years of education: 10  . Highest education level: Not on file  Occupational History  . Not on file  Social Needs  . Financial resource strain: Not hard at all  . Food insecurity    Worry: Never true    Inability: Never true  . Transportation needs    Medical: No    Non-medical: No  Tobacco Use  . Smoking status: Current Every Day Smoker    Packs/day: 0.50    Years: 20.00    Pack years: 10.00    Types: Cigarettes    Start date: 03/18/1990  . Smokeless tobacco: Never Used  Substance and Sexual Activity  . Alcohol use: Yes    Alcohol/week: 0.0 standard drinks    Comment: maybe once a year  . Drug use: No  . Sexual activity: Yes    Birth control/protection: None    Comment: PCOS  Lifestyle  . Physical activity    Days per week: 0 days    Minutes per session: 0 min  . Stress: To some extent  Relationships  . Social connections    Talks on phone: More than three times a week    Gets together: More than three times a week    Attends religious service:  Never    Active member of club or organization: No    Attends meetings of clubs or organizations: Never    Relationship status: Married  . Intimate partner violence    Fear of current or ex partner: No    Emotionally abused: No    Physically abused: No    Forced sexual activity: No  Other Topics Concern  . Not on file  Social History Narrative   Unemployed   Applied for disability   Leisure "take care of house  and kids"   Walks when able   Family History  Problem Relation Age of Onset  . Heart murmur Father   . Diabetes Father   . Hypertension Father   . Hyperlipidemia Father   . Cancer Father        throat  . Diabetes Mother   . Cancer Mother        breast.uterine, ovarian  . Asthma Son   . Appendicitis Son   . Hernia Son   . Mental illness Son        Bipolar, personality d/o  . Stroke Maternal Grandmother   . Cancer Maternal Grandmother        breast  . Cancer Paternal Grandfather        lung    Objective: Office vital signs reviewed. BP 122/79   Pulse (!) 102   Temp 98.6 F (37 C) (Temporal)   Ht 5' 10"  (1.778 m)   Wt (!) 401 lb (181.9 kg)   BMI 57.54 kg/m   Physical Examination:  General: Awake, alert, obese, No acute distress. Smells of tobacco HEENT: Normal, sclera white, MMM Cardio: regular rate and rhythm, S1S2 heard, no murmurs appreciated Pulm: clear to auscultation bilaterally, no wheezes, rhonchi or rales; normal work of breathing on room air Extremities: warm, well perfused, No edema, cyanosis or clubbing; +2 pulses bilaterally MSK: normal gait and station Skin: dry; intact; no rashes or lesions Neuro:see DM foot Diabetic Foot Exam - Simple   Simple Foot Form Diabetic Foot exam was performed with the following findings: Yes 06/05/2019  1:16 PM  Visual Inspection No deformities, no ulcerations, no other skin breakdown bilaterally: Yes Sensation Testing See comments: Yes Pulse Check Posterior Tibialis and Dorsalis pulse intact bilaterally:  Yes Comments Essentially absent monofilament testing to bilateral feet.  She was able to sense it on the plantar aspect just below the great toe on the right.  Vibratory sensation absent bilaterally as well.  No ulcerations or pre-ulcerative calluses noted.    Psych: Mood stable, speech normal, affect appropriate, good eye contact Depression screen [facility] 2/9 06/05/2019 10/04/2018 07/17/2018  Decreased Interest 0 0 0  Down, Depressed, Hopeless 0 0 0  PHQ - 2 Score 0 0 0  Altered sleeping 0 0 -  Tired, decreased energy 0 0 -  Change in appetite 0 0 -  Feeling bad or failure about yourself  0 0 -  Trouble concentrating 0 0 -  Moving slowly or fidgety/restless 0 0 -  Suicidal thoughts 0 0 -  PHQ-9 Score 0 0 -  Difficult doing work/chores - Not difficult at all -   GAD 7 : Generalized Anxiety Score 06/05/2019  Nervous, Anxious, on Edge 2  Control/stop worrying 2  Worry too much - different things 2  Trouble relaxing 3  Restless 0  Easily annoyed or irritable 3  Afraid - awful might happen 0  Total GAD 7 Score 12  Anxiety Difficulty Somewhat difficult   Assessment/ Plan: 38 y.o. female   1. Type 2 diabetes mellitus with stage 2 chronic kidney disease, without long-term current use of insulin (HCC) Her A1c has increased since last visit and is now in the uncontrolled range.  She had been previously controlled and I do question if the anxiety she is has been experiencing is causing her to emotionally eat.  I am starting her on Prozac and therefore we will leave her current regimen for diabetes control as is.  If she has not made improvement by  next visit and is at target again we will need to consider additional medications.  She is to schedule her diabetic eye exam.  Diabetic foot exam was performed today and showed neuropathy which is known.  Her flu vaccination was administered. Lab Results  Component Value Date   HGBA1C 7.3 (H) 06/05/2019  - [facility] DCA Hb A1c Waived - Lipid Panel  2.  Hypertension associated with diabetes ([place]) Controlled.  Continue current regimen - Lipid Panel  3. Hyperlipidemia associated with type 2 diabetes mellitus (HCC) Continue statin. - Lipid Panel - CMP14+EGFR  4. Diabetic neuropathy, painful ([place]) Controlled substance contract was updated and signed.  I have increased her Lyrica to 100 mg twice daily given ongoing lower extremity pain. - ToxASSURE Select 13 (MW), Urine - pregabalin (LYRICA) 100 MG capsule; Take 1 capsule (100 mg total) by mouth 2 (two) times daily.  Dispense: 60 capsule; Refill: 2  5. Controlled substance agreement signed - ToxASSURE Select 13 (MW), Urine  6. Leukocytosis, unspecified type - CBC  7. Generalized anxiety disorder Prozac 20 mg daily started.  May need to consider increase to 40 mg.  She will follow-up in 4 to 6 weeks for recheck via virtual visit. - FLUoxetine (PROZAC) 20 MG capsule; Take 1 capsule (20 mg total) by mouth daily.  Dispense: 30 capsule; Refill: 1   No orders of the defined types were placed in this encounter.  No orders of the defined types were placed in this encounter.    Janora Norlander, DO [place] [phone]

## 2019-06-05 NOTE — Patient Instructions (Signed)
We have increased your Lyrica today. Prozac added for anxiety/ panic.  Take 1 capsule daily.  Taking the medicine as directed and not missing any doses is one of the best things you can do to treat your depression/anxiety.  Here are some things to keep in mind:  1) Side effects (stomach upset, some increased anxiety) may happen before you notice a benefit.  These side effects typically go away over time. 2) Changes to your dose of medicine or a change in medication all together is sometimes necessary 3) Most people need to be on medication at least 12 months 4) Many people will notice an improvement within two weeks but the full effect of the medication can take up to 4-6 weeks 5) Stopping the medication when you start feeling better often results in a return of symptoms 6) Never discontinue your medication without contacting a health care professional first.  Some medications require gradual discontinuation/ taper and can make you sick if you stop them abruptly.  If your symptoms worsen or you have thoughts of suicide/homicide, PLEASE SEEK IMMEDIATE MEDICAL ATTENTION.  You may always call:  National Suicide Hotline: 680-218-2924 [place]: (718) 194-2553 Crisis Recovery in [place]: [phone]   These are available 24 hours a day, 7 days a week.

## 2019-06-06 ENCOUNTER — Other Ambulatory Visit: Payer: Self-pay | Admitting: Family Medicine

## 2019-06-06 DIAGNOSIS — E1169 Type 2 diabetes mellitus with other specified complication: Secondary | ICD-10-CM

## 2019-06-06 DIAGNOSIS — I252 Old myocardial infarction: Secondary | ICD-10-CM

## 2019-06-06 DIAGNOSIS — E785 Hyperlipidemia, unspecified: Secondary | ICD-10-CM

## 2019-06-06 LAB — CMP14+EGFR
ALT: 57 IU/L — ABNORMAL HIGH (ref 0–32)
AST: 49 IU/L — ABNORMAL HIGH (ref 0–40)
Albumin/Globulin Ratio: 1.3 (ref 1.2–2.2)
Albumin: 3.9 g/dL (ref 3.8–4.8)
Alkaline Phosphatase: 102 IU/L (ref 39–117)
BUN/Creatinine Ratio: 12 (ref 9–23)
BUN: 9 mg/dL (ref 6–24)
Bilirubin Total: 0.4 mg/dL (ref 0.0–1.2)
CO2: 18 mmol/L — ABNORMAL LOW (ref 20–29)
Calcium: 8.8 mg/dL (ref 8.7–10.2)
Chloride: 100 mmol/L (ref 96–106)
Creatinine, Ser: 0.73 mg/dL (ref 0.57–1.00)
GFR calc Af Amer: 119 mL/min/{1.73_m2} (ref >59)
GFR calc non Af Amer: 103 mL/min/{1.73_m2} (ref >59)
Globulin, Total: 3 g/dL (ref 1.5–4.5)
Glucose: 179 mg/dL — ABNORMAL HIGH (ref 65–99)
Potassium: 4 mmol/L (ref 3.5–5.2)
Sodium: 134 mmol/L (ref 134–144)
Total Protein: 6.9 g/dL (ref 6.0–8.5)

## 2019-06-06 LAB — CBC
Hematocrit: 41.3 % (ref 34.0–46.6)
Hemoglobin: 14.2 g/dL (ref 11.1–15.9)
MCH: 30.3 pg (ref 26.6–33.0)
MCHC: 34.4 g/dL (ref 31.5–35.7)
MCV: 88 fL (ref 79–97)
Platelets: 266 10*3/uL (ref 150–450)
RBC: 4.69 x10E6/uL (ref 3.77–5.28)
RDW: 12.8 % (ref 11.7–15.4)
WBC: 10.7 10*3/uL (ref 3.4–10.8)

## 2019-06-06 LAB — LIPID PANEL
Chol/HDL Ratio: 7.1 ratio — ABNORMAL HIGH (ref 0.0–4.4)
Cholesterol, Total: 213 mg/dL — ABNORMAL HIGH (ref 100–199)
HDL: 30 mg/dL — ABNORMAL LOW (ref >39)
LDL Chol Calc (NIH): 141 mg/dL — ABNORMAL HIGH (ref 0–99)
Triglycerides: 232 mg/dL — ABNORMAL HIGH (ref 0–149)
VLDL Cholesterol Cal: 42 mg/dL — ABNORMAL HIGH (ref 5–40)

## 2019-06-06 MED ORDER — VASCEPA 1 G PO CAPS: 2.0000 | ORAL_CAPSULE | Freq: Two times a day (BID) | ORAL | 5 refills | Status: DC

## 2019-06-07 ENCOUNTER — Telehealth: Payer: Self-pay | Admitting: *Deleted

## 2019-06-07 LAB — TOXASSURE SELECT 13 (MW), URINE

## 2019-06-07 NOTE — Telephone Encounter (Signed)
Vascepa 1 GM capsules Non-Preferred by Medicaid  Pt must try and fail both Fenofibrate tab (generic for Tricor) and Gemfibrozil tab (generic for Lopid)

## 2019-06-11 NOTE — Telephone Encounter (Signed)
Is this something we could submit a Prior Auth for?  She has had a MI in the past and has uncontrolled cholesterol on high dose statin.  Evidence is stronger for Vascepa, so I prefer this if possible.

## 2019-06-11 NOTE — Telephone Encounter (Signed)
The only way I can do PA is if the 2 preferred are contraindicated.

## 2019-06-14 ENCOUNTER — Other Ambulatory Visit: Payer: Self-pay | Admitting: Family Medicine

## 2019-06-14 DIAGNOSIS — E785 Hyperlipidemia, unspecified: Secondary | ICD-10-CM

## 2019-06-14 DIAGNOSIS — E1169 Type 2 diabetes mellitus with other specified complication: Secondary | ICD-10-CM

## 2019-06-14 MED ORDER — OMEGA-3-ACID ETHYL ESTERS 1 G PO CAPS: 2.0000 g | ORAL_CAPSULE | Freq: Two times a day (BID) | ORAL | 3 refills | Status: DC

## 2019-06-15 ENCOUNTER — Telehealth: Payer: Self-pay | Admitting: *Deleted

## 2019-06-15 NOTE — Telephone Encounter (Signed)
Lovaza and generic Lovaza Non Preferred by insurance  Must try and fail 2 of the preferred which are generic Tricor and generic Lopid

## 2019-06-25 ENCOUNTER — Other Ambulatory Visit: Payer: Self-pay | Admitting: Family Medicine

## 2019-06-25 DIAGNOSIS — E782 Mixed hyperlipidemia: Secondary | ICD-10-CM

## 2019-06-25 MED ORDER — FENOFIBRATE 48 MG PO TABS: 48.0000 mg | ORAL_TABLET | Freq: Every day | ORAL | 0 refills | Status: DC

## 2019-06-25 NOTE — Telephone Encounter (Signed)
I have sent in tricor 48mg  daily.  Please have patient continue crestor.  If develops new muscle pain, she is to discontinue and call me immediately.  Otherwise, plan for fasting lipid panel and repeat FLTs in 12 weeks.

## 2019-06-25 NOTE — Telephone Encounter (Signed)
Patient aware.

## 2019-07-04 ENCOUNTER — Other Ambulatory Visit: Payer: Self-pay | Admitting: Student

## 2019-07-04 ENCOUNTER — Other Ambulatory Visit: Payer: Self-pay | Admitting: Family Medicine

## 2019-07-04 ENCOUNTER — Other Ambulatory Visit: Payer: Self-pay | Admitting: Cardiology

## 2019-07-24 ENCOUNTER — Ambulatory Visit (INDEPENDENT_AMBULATORY_CARE_PROVIDER_SITE_OTHER): Payer: Medicaid Other | Admitting: Family Medicine

## 2019-07-24 DIAGNOSIS — E1140 Type 2 diabetes mellitus with diabetic neuropathy, unspecified: Secondary | ICD-10-CM | POA: Diagnosis not present

## 2019-07-24 DIAGNOSIS — E1169 Type 2 diabetes mellitus with other specified complication: Secondary | ICD-10-CM | POA: Diagnosis not present

## 2019-07-24 DIAGNOSIS — F411 Generalized anxiety disorder: Secondary | ICD-10-CM

## 2019-07-24 DIAGNOSIS — N182 Chronic kidney disease, stage 2 (mild): Secondary | ICD-10-CM

## 2019-07-24 DIAGNOSIS — F5104 Psychophysiologic insomnia: Secondary | ICD-10-CM

## 2019-07-24 DIAGNOSIS — E785 Hyperlipidemia, unspecified: Secondary | ICD-10-CM | POA: Diagnosis not present

## 2019-07-24 DIAGNOSIS — E1122 Type 2 diabetes mellitus with diabetic chronic kidney disease: Secondary | ICD-10-CM | POA: Diagnosis not present

## 2019-07-24 MED ORDER — FLUOXETINE HCL 20 MG PO CAPS: 20.0000 mg | ORAL_CAPSULE | Freq: Every day | ORAL | 5 refills | Status: DC

## 2019-07-24 NOTE — Progress Notes (Signed)
Telephone visit  Subjective: CC: f/u sugar, anxiety PCP: Janora Norlander, DO IOM:BTDHRCBUL [name] is a 38 y.o. female calls for telephone consult today. Patient provides verbal consent for consult held via phone.  Location of patient: home Location of provider: Working remotely from home Others present for call: none  1. DM w/ HLD Sugars are ranging 125-220s.  She is tolerating the fenofibrate with the Crestor without difficulty.  Denies myalgia.  Her neuropathy has improved with the increase in Lyrica.  She is ambulating more easily now that the pain is more bearable.  No falls.  She is trying to eat better.  2. Anxiety Patient reports that anxiety and panic have improved quite a bit since starting the Prozac 22m.  She notes some GI side effects initially (decreased appetite, nausea) but that has since resolved.  The panic and anxiety has decreased in frequency and severity.  She continues to have sleep issues despite being tired at night.  She denies naps during the day.   ROS: Per HPI  Allergies  Allergen Reactions  . Bee Venom Anaphylaxis and Swelling  . Latex Itching  . Sulfa Drugs Cross Reactors Nausea And Vomiting   Past Medical History:  Diagnosis Date  . Allergy   . Anemia   . Anxiety   . [place] ([place]est[place]) [place]   . Chronic kidney disease    history of kidney stones  . Coronary artery disease 09/2011   a.s/p BMS to LAD and angioplasty of RI in 09/2011 b. patent stent by cath in 02/2012, 12/2013, and 02/2016 with most recent showing 30-40% dLAD and 30-40% RCA stenosis  . Depression   . Diabetes mellitus   . Hyperlipidemia   . Hypertension   . Midsternal chest pain 03/03/2012  . Migraines   . Morbid obesity ([place]   . Myocardial infarct ([place] 09/2011  . Myocardial infarction ([place] 02/2016  . Palpitations 02/17/2012  . Sleep apnea   . Ureteral stone with hydronephrosis   . Urinary tract obstruction due to kidney stone 02/15/2017    Current  Outpatient Medications:  .  ACCU-CHEK AVIVA PLUS test strip, USE TO TEST BLOOD SUGAR FOUR TIMES DAILY, Disp: 400 each, Rfl: 3 .  aspirin 81 MG chewable tablet, Chew 81 mg by mouth daily., Disp: , Rfl:  .  blood glucose meter kit and supplies, Dispense based on patient and insurance preference. Use up to four times daily as directed. (FOR ICD-10:  E11.22). Check BG daily, Disp: 1 each, Rfl: 0 .  carvedilol (COREG) 25 MG tablet, TAKE 1 TABLET BY MOUTH TWICE DAILY with a meal, Disp: 180 tablet, Rfl: 3 .  fenofibrate (TRICOR) 48 MG tablet, Take 1 tablet (48 mg total) by mouth daily., Disp: 90 tablet, Rfl: 0 .  FLUoxetine (PROZAC) 20 MG capsule, Take 1 capsule (20 mg total) by mouth daily., Disp: 30 capsule, Rfl: 1 .  Insulin Pen Needle (GLOBAL EASE INJECT PEN NEEDLES) 31G X 5 MM MISC, Use one daily Dx E11.9, Disp: 100 each, Rfl: 3 .  liraglutide (VICTOZA) 18 MG/3ML SOPN, INJECT 1.2 MG INTO THE SKIN EVERY DAY, Disp: 6 mL, Rfl: 3 .  losartan (COZAAR) 25 MG tablet, TAKE 1/2 TABLET BY MOUTH EVERY DAY, Disp: 45 tablet, Rfl: 3 .  medroxyPROGESTERone (PROVERA) 5 MG tablet, TAKE 1 TABLET BY MOUTH DAILY, Disp: 90 tablet, Rfl: 0 .  metFORMIN (GLUCOPHAGE) 1000 MG tablet, Take 1 tablet (1,000 mg total) by mouth 2 (two) times daily., Disp: 60 tablet,  Rfl: 5 .  nitroGLYCERIN (NITROSTAT) 0.4 MG SL tablet, DISSOLVE 1 TABLET UNDER THE TONGUE EVERY 5 MINUTES AS NEEDED FOR CHEST PAIN. DO NOT EXCEED A TOTAL OF 3 DOSES IN 15 MINUTES. (Patient taking differently: Place 0.4 mg under the tongue every 5 (five) minutes x 3 doses as needed for chest pain. ), Disp: 25 tablet, Rfl: 3 .  potassium chloride SA (K-DUR,KLOR-CON) 20 MEQ tablet, TAKE 1 TABLET BY MOUTH EVERY DAY, Disp: 90 tablet, Rfl: 3 .  pregabalin (LYRICA) 100 MG capsule, Take 1 capsule (100 mg total) by mouth 2 (two) times daily., Disp: 60 capsule, Rfl: 2 .  ranolazine (RANEXA) 500 MG 12 hr tablet, TAKE 1 TABLET BY MOUTH TWICE DAILY, Disp: 180 tablet, Rfl: 3 .   rosuvastatin (CRESTOR) 40 MG tablet, TAKE ONE TABLET BY MOUTH DAILY, Disp: 90 tablet, Rfl: 3 .  torsemide (DEMADEX) 20 MG tablet, TAKE TWO TABLETS BY MOUTH EVERY MORNING and TAKE THREE TABLETS EVERY EVENING, Disp: 150 tablet, Rfl: 3  Assessment/ Plan: 38 y.o. female   1. Generalized anxiety disorder Under better control. Continue current dose of Prozac.  Follow up in December as scheduled. - FLUoxetine (PROZAC) 20 MG capsule; Take 1 capsule (20 mg total) by mouth daily.  Dispense: 30 capsule; Refill: 5  2. Psychophysiological insomnia Start melatonin at bedtime.  3. Type 2 diabetes mellitus with stage 2 chronic kidney disease, without long-term current use of insulin ([place]) Follow up in December as scheduled for recheck A1c.  Continue diet modification  4. Diabetic neuropathy, painful ([place]) Improving w/ increased dose of Lyrica.  5. Hyperlipidemia associated with type 2 diabetes mellitus ([place]) Tolerating fenofibrate w/ statin. Plan for FLP at next visit.  Start time: 1:01pm End time: 1:17pm  Total time spent on patient care (including telephone call/ virtual visit): 21 minutes  [place], [place] [phone]

## 2019-08-02 ENCOUNTER — Other Ambulatory Visit: Payer: Self-pay | Admitting: Family Medicine

## 2019-08-02 DIAGNOSIS — E1122 Type 2 diabetes mellitus with diabetic chronic kidney disease: Secondary | ICD-10-CM

## 2019-08-06 ENCOUNTER — Other Ambulatory Visit: Payer: Self-pay | Admitting: Family Medicine

## 2019-08-06 NOTE — Telephone Encounter (Signed)
done

## 2019-08-26 ENCOUNTER — Other Ambulatory Visit: Payer: Self-pay | Admitting: Family Medicine

## 2019-08-26 DIAGNOSIS — E1140 Type 2 diabetes mellitus with diabetic neuropathy, unspecified: Secondary | ICD-10-CM

## 2019-08-28 NOTE — Telephone Encounter (Signed)
The Narcotic Database has been reviewed.  There were no red flags.    

## 2019-08-29 ENCOUNTER — Telehealth (INDEPENDENT_AMBULATORY_CARE_PROVIDER_SITE_OTHER): Payer: Medicaid Other | Admitting: Cardiology

## 2019-08-29 ENCOUNTER — Encounter: Payer: Self-pay | Admitting: Cardiology

## 2019-08-29 VITALS — BP 158/86 | Ht 70.0 in | Wt 394.0 lb

## 2019-08-29 DIAGNOSIS — I25118 Atherosclerotic heart disease of native coronary artery with other forms of angina pectoris: Secondary | ICD-10-CM | POA: Diagnosis not present

## 2019-08-29 DIAGNOSIS — I10 Essential (primary) hypertension: Secondary | ICD-10-CM

## 2019-08-29 DIAGNOSIS — E782 Mixed hyperlipidemia: Secondary | ICD-10-CM

## 2019-08-29 DIAGNOSIS — I5032 Chronic diastolic (congestive) heart failure: Secondary | ICD-10-CM

## 2019-08-29 NOTE — Patient Instructions (Signed)
Your physician wants you to follow-up in: 6 MONTHS WITH DR BRANCH You will receive a reminder letter in the mail two months in advance. If you don't receive a letter, please call our office to schedule the follow-up appointment. ° °Your physician recommends that you continue on your current medications as directed. Please refer to the Current Medication list given to you today. ° °You have been referred to LIPID CLINIC  ° °Thank you for choosing [facility]!! ° ° ° °

## 2019-08-29 NOTE — Progress Notes (Signed)
Virtual Visit via Telephone Note   This visit type was conducted due to national recommendations for restrictions regarding the COVID-19 Pandemic (e.g. social distancing) in an effort to limit this patient's exposure and mitigate transmission in our community.  Due to her co-morbid illnesses, this patient is at least at moderate risk for complications without adequate follow up.  This format is felt to be most appropriate for this patient at this time.  The patient did not have access to video technology/had technical difficulties with video requiring transitioning to audio format only (telephone).  All issues noted in this document were discussed and addressed.  No physical exam could be performed with this format.  Please refer to the patient's chart for her  consent to telehealth for [facility].   Date:  08/29/2019   ID:  [name], DOB [birth year], MRN [id number]  Patient Location: Home Provider Location: Home  PCP:  Janora Norlander, DO  Cardiologist:  Carlyle Dolly, MD  Electrophysiologist:  None   Evaluation Performed:  Follow-Up Visit  Chief Complaint:  Follow up  History of Present Illness:    [name] is a 38 y.o. female seen today for a focused visit on her history of CAD and recent chest pain.     1. CAD - prior anterior MI 09/2011, 95% lesion in mid LAD and ramus with 95% ostial stenosis. LVEF 55% by LV gram, apical hypokinesis. BMS to mid LAD and cutting balloon angioplasty of ramus.  - repeat cath 02/2012 with patent vessels and stent - 09/2011 Echo: LVEF 50% - 12/2013 Lexiscan large anteroseptal ischemia - cath 12/2013 with patent vessels - cath 02/2016 [facility] in setting of NSTEMI showed LM patent, LAD patent with patent stent, distal LAD 30-40%, LCX patent, RCA 30-40%. LVEDP 40, PCWP 30 mean PA 32, CI 4.93 - echo 02/2016 [place] LVEF 50%, apical akinesis    - 07/2018 cath mild to moderate disease other than small D1 99% too small for  PCI. Normal LVEDP - we tried imdur 56m daily. Reported some episodes of shaking on this medication. Changed to ranexa   - mild chest pains at times, short in duration - compliant with meds   2. Chronic diastolic HF  - takes torsemide 458mdaily, will take additional 2075ms needed.Reports fluid is well controlled   3. HTN -compliant with meds. Elevated at home today but from prior recent clinic visits has been at goal.    4. Hyperlipidemia - 05/2019 TC 213 TG 232 HDL 30 LDL 141 - pcp recently started fenofibrate - has been compliant with crestor 40      The patient does not have symptoms concerning for COVID-19 infection (fever, chills, cough, or new shortness of breath).    Past Medical History:  Diagnosis Date  . Allergy   . Anemia   . Anxiety   . CHF (congestive heart failure) (HCCTroy . Chronic kidney disease    history of kidney stones  . Coronary artery disease 09/2011   a.s/p BMS to LAD and angioplasty of RI in 09/2011 b. patent stent by cath in 02/2012, 12/2013, and 02/2016 with most recent showing 30-40% dLAD and 30-40% RCA stenosis  . Depression   . Diabetes mellitus   . Hyperlipidemia   . Hypertension   . Midsternal chest pain 03/03/2012  . Migraines   . Morbid obesity (HCCScott . Myocardial infarct (HCCKam[place]1/2013  . Myocardial infarction (HCCC[place]6/2017  . Palpitations 02/17/2012  .  Sleep apnea   . Ureteral stone with hydronephrosis   . Urinary tract obstruction due to kidney stone 02/15/2017   Past Surgical History:  Procedure Laterality Date  . CARDIAC CATHETERIZATION  10/08/2011   LAD: 95% mid, Ramus: 95% ostial  . CARPAL TUNNEL RELEASE    . CESAREAN SECTION    . CHOLECYSTECTOMY    . CORONARY ANGIOPLASTY WITH STENT PLACEMENT  10/08/2011   LAD: BMS, Ramus: cutting balloon angioplasty  . CYSTOSCOPY WITH RETROGRADE PYELOGRAM, URETEROSCOPY AND STENT PLACEMENT Left 02/16/2017   Procedure: CYSTOSCOPY WITH RETROGRADE PYELOGRAM, URETEROSCOPY AND STENT  PLACEMENT;  Surgeon: Kathie Rhodes, MD;  Location: [facility];  Service: Urology;  Laterality: Left;  . CYSTOSCOPY WITH RETROGRADE PYELOGRAM, URETEROSCOPY AND STENT PLACEMENT Left 03/28/2017   Procedure: LEFT STENT REMOVAL LEFT RETROGRADE PYELOGRAM LEFT URETEROSCOPY   LASER LITHOTRIPSY  AND  STENT REPLACEMENT;  Surgeon: Cleon Gustin, MD;  Location: [facility];  Service: Urology;  Laterality: Left;  . LEFT HEART CATH AND CORONARY ANGIOGRAPHY N/A 08/15/2018   Procedure: LEFT HEART CATH AND CORONARY ANGIOGRAPHY;  Surgeon: Martinique, Peter M, MD;  Location: [place] CV LAB;  Service: Cardiovascular;  Laterality: N/A;  . LEFT HEART CATHETERIZATION WITH CORONARY ANGIOGRAM N/A 10/08/2011   Procedure: LEFT HEART CATHETERIZATION WITH CORONARY ANGIOGRAM;  Surgeon: Peter M Martinique, MD;  Location: [facility] CATH LAB;  Service: Cardiovascular;  Laterality: N/A;  . LEFT HEART CATHETERIZATION WITH CORONARY ANGIOGRAM N/A 03/02/2012   Procedure: LEFT HEART CATHETERIZATION WITH CORONARY ANGIOGRAM;  Surgeon: Burnell Blanks, MD;  Location: [facility] CATH LAB;  Service: Cardiovascular;  Laterality: N/A;  . LEFT HEART CATHETERIZATION WITH CORONARY ANGIOGRAM N/A 01/04/2014   Procedure: LEFT HEART CATHETERIZATION WITH CORONARY ANGIOGRAM;  Surgeon: Troy Sine, MD;  Location: [facility] CATH LAB;  Service: Cardiovascular;  Laterality: N/A;     Current Meds  Medication Sig  . ACCU-CHEK AVIVA PLUS test strip USE TO TEST BLOOD SUGAR FOUR TIMES DAILY  . aspirin 81 MG chewable tablet Chew 81 mg by mouth daily.  . blood glucose meter kit and supplies Dispense based on patient and insurance preference. Use up to four times daily as directed. (FOR ICD-10:  E11.22). Check BG daily  . carvedilol (COREG) 25 MG tablet TAKE 1 TABLET BY MOUTH TWICE DAILY with a meal  . fenofibrate (TRICOR) 48 MG tablet Take 1 tablet (48 mg total) by mouth daily.  Marland Kitchen FLUoxetine (PROZAC) 20 MG capsule Take 1 capsule (20 mg total) by mouth daily.  . Insulin Pen Needle  (GLOBAL EASE INJECT PEN NEEDLES) 31G X 5 MM MISC Use one daily Dx E11.9  . liraglutide (VICTOZA) 18 MG/3ML SOPN INJECT 1.2 MG INTO SKIN EVERY DAY  . losartan (COZAAR) 25 MG tablet TAKE 1/2 TABLET BY MOUTH EVERY DAY  . medroxyPROGESTERone (PROVERA) 5 MG tablet TAKE 1 TABLET BY MOUTH DAILY  . metFORMIN (GLUCOPHAGE) 1000 MG tablet Take 1 tablet (1,000 mg total) by mouth 2 (two) times daily.  . nitroGLYCERIN (NITROSTAT) 0.4 MG SL tablet DISSOLVE 1 TABLET UNDER THE TONGUE EVERY 5 MINUTES AS NEEDED FOR CHEST PAIN. DO NOT EXCEED A TOTAL OF 3 DOSES IN 15 MINUTES. (Patient taking differently: Place 0.4 mg under the tongue every 5 (five) minutes x 3 doses as needed for chest pain. )  . potassium chloride SA (K-DUR,KLOR-CON) 20 MEQ tablet TAKE 1 TABLET BY MOUTH EVERY DAY  . pregabalin (LYRICA) 100 MG capsule TAKE ONE CAPSULE BY MOUTH TWICE DAILY  . ranolazine (RANEXA) 500 MG 12 hr  tablet TAKE 1 TABLET BY MOUTH TWICE DAILY  . rosuvastatin (CRESTOR) 40 MG tablet TAKE ONE TABLET BY MOUTH DAILY  . torsemide (DEMADEX) 20 MG tablet Take 40 mg by mouth daily.  . [DISCONTINUED] torsemide (DEMADEX) 20 MG tablet TAKE TWO TABLETS BY MOUTH EVERY MORNING and TAKE THREE TABLETS EVERY EVENING     Allergies:   Bee venom, Latex, and Sulfa drugs cross reactors   Social History   Tobacco Use  . Smoking status: Current Every Day Smoker    Packs/day: 0.50    Years: 20.00    Pack years: 10.00    Types: Cigarettes    Start date: 03/18/1990  . Smokeless tobacco: Never Used  Substance Use Topics  . Alcohol use: Yes    Alcohol/week: 0.0 standard drinks    Comment: maybe once a year  . Drug use: No     Family Hx: The patient's family history includes Appendicitis in her son; Asthma in her son; Cancer in her father, maternal grandmother, mother, and paternal grandfather; Diabetes in her father and mother; Heart murmur in her father; Hernia in her son; Hyperlipidemia in her father; Hypertension in her father; Mental  illness in her son; Stroke in her maternal grandmother.  ROS:   Please see the history of present illness.     All other systems reviewed and are negative.   Prior CV studies:   The following studies were reviewed today:  02/2012 Cath Hemodynamic Findings: Central aortic pressure: 108/70  Left ventricular pressure: 123/10/21  Angiographic Findings: Left main: No obstructive disease noted.  Left Anterior Descending Artery: Large caliber vessel that courses to the apex. There is a stent present in the mid vessel that is widely patent with no restenosis. The remainder of the LAD is disease free. There is a moderate sized diagonal Nyna Chilton with 30% proximal stenosis.  Circumflex Artery: Dominant, large caliber vessel with no disease throughout the AV groove segment. There is small to moderate sized intermediate Jarquavious Fentress that has ostial 30% stenosis. The remainder of the Circumflex has no obstructive disease.  Right Coronary Artery: Small, non-dominant vessel. No disease noted.  Left Ventricular Angiogram: LVEF 50-55%.  Impression:  1. Double vessel CAD with patent stent mid LAD and patent angioplasty site at ostium of intermediate Laithan Conchas.  2. Normal LV systolic function   02/3844 Cath PROCEDURAL FINDINGS  Hemodynamics:  AO 135/92 with a mean of 114 mmHg  LV 136/25 mmHg  Coronary angiography:  Coronary dominance: Left  Left mainstem: Normal.  Left anterior descending (LAD): There is a 95% stenosis in the mid LAD immediately after the takeoff of the first diagonal. The first diagonal Tyren Dugar is moderate in size and appears normal. The LAD stenosis does appear to be hazy.  There is a moderately large ramus intermediate Zakiya Sporrer which has a 95% ostial stenosis.  Left circumflex (LCx): This is a dominant vessel and appears normal throughout.  Right coronary artery (RCA): This is a small nondominant vessel and is normal.  Left ventriculography: Left ventricular systolic  function is abnormal with apical hypokinesis. LVEF is estimated at 55%, there is no significant mitral regurgitation  PCI Note: Following the diagnostic procedure, the decision was made to proceed with PCI of the LAD. Weight-based bivalirudin was given for anticoagulation. Once a therapeutic ACT was achieved, a 5 [place] EBU guide catheter was inserted. A pro-water coronary guidewire was used to cross the lesion. The lesion was predilated with a 2.5 mm balloon. The lesion was then stented with a  3.0 x 18 mm vision stent. The stent was postdilated with a 3.25 mm noncompliant balloon. Following PCI, there was 0% residual stenosis and TIMI-3 flow. At this point the patient's pain was improved but was Shutt of moderate intensity. We then proceeded to intervene on the ostial ramus intermediate lesion. This was crossed with the prowater wire. This was dilated using a 3.0 x 10 mm cutting balloon performing 3 inflations to 6 atmospheres. This showed a good angiographic result with less than 30% residual stenosis and TIMI grade 3 flow. Final angiography confirmed an excellent result. The patient tolerated the procedure well. There were no immediate procedural complications. A TR band was used for radial hemostasis. The patient was transferred to the post catheterization recovery area for further monitoring.  PCI Data:  Vessel - LAD/Segment - mid vessel immediately after the takeoff of the first diagonal.  Percent Stenosis (pre) 95%  TIMI-flow 3.  Stent 3.0 x 18 mm vision stent.  Percent Stenosis (post) 0%  TIMI-flow (post) 3.  Second vessel: Ostial ramus intermediate Vaishali Baise  Percent stenosis: 95%.  TIMI flow 3.  3.0 mm cutting balloon  Percent stenosis (post) less than 30%  TIMI flow 3  Final Conclusions:  1. Severe 2 vessel obstructive coronary disease.  2. Overall well preserved LV systolic function with apical wall motion abnormality and ejection fraction of 55%.  3. Successful  intracoronary stenting of the mid LAD with a bare-metal stent.  4. Successful cutting balloon angioplasty of the ramus intermediate Delfin Squillace.  Recommendations:  Continue therapy with Plavix. Patient reports a history of allergy to aspirin. Aggressive risk factor modification.  Disposition: Patient be observed in the intensive care unit tonight. If she has no complications she should be able to be transferred to telemetry in the morning.   12/2013 Cath HEMODYNAMICS:  Central Aorta: 100/60  Left Ventricle: 100/3  ANGIOGRAPHY:  1. Left main: Normal and trifurcated into an LAD, ramus intermediate vessel, and a dominant left circumflex coronary artery  2. LAD: Leandrew Koyanagi gave rise to a proximal large first diagonal vessel. The LAD just after the diagonal vessel had a widely patent stent. The remainder of the LAD was free of significant disease and extended to the LV apex. 3. Ramus Intermediate: No evidence for restenosis at previous site of ostial cutting balloon intervention. 4. Left circumflex: Angiographically normal. Dominant vessel, which gave rise to 2 marginal branches and in the PDA.Marland Kitchen  4. Right coronary artery: Angiographically normal, nondominant vessel  Left ventriculography revealed global LV function. There was a suggestion of mild residual distal anterolateral hypocontractility. There was catheter-induced mitral regurgitation.  IMPRESSION:  Normal LV function with mild distal anterolateral, residual hypocontractility  No significant residual coronary obstructive disease with evidence for widely patent LAD stent, no evidence for restenosis of the ramus intermediate vessel, normal dominant left circumflex coronary artery and normal nondominant right coronary artery.    12/2013 MPI IMPRESSION: 1. Abnormal Lexiscan for ischemia  2. Large anteroseptal and apical area of ischemia  3. High risk study for major cardiac events based on area of myocardium at  [facility]  4. Normal left ventricular systolic function, left ventricular ejection fraction 52%   Labs/Other Tests and Data Reviewed:    EKG:  No ECG reviewed.  Recent Labs: 06/05/2019: ALT 57; BUN 9; Creatinine, Ser 0.73; Hemoglobin 14.2; Platelets 266; Potassium 4.0; Sodium 134   Recent Lipid Panel Lab Results  Component Value Date/Time   CHOL 213 (H) 06/05/2019 10:08 AM   TRIG 232 (H)  06/05/2019 10:08 AM   HDL 30 (L) 06/05/2019 10:08 AM   CHOLHDL 7.1 (H) 06/05/2019 10:08 AM   CHOLHDL 6.2 (H) 10/28/2017 12:21 PM   LDLCALC 141 (H) 06/05/2019 10:08 AM   LDLCALC 122 (H) 10/28/2017 12:21 PM    Wt Readings from Last 3 Encounters:  08/29/19 (!) 394 lb (178.7 kg)  06/05/19 (!) 401 lb (181.9 kg)  03/28/19 (!) 404 lb (183.3 kg)     Objective:    Vital Signs:  BP (!) 158/86   Ht 5' 10"  (1.778 m)   Wt (!) 394 lb (178.7 kg)   BMI 56.53 kg/m    Normal affect. Normal speech pattern and tone. Comfortable, no apparent distress. No Audible signs of SOB or wheezing.   ASSESSMENT & PLAN:    1. CAD with stable angina -overall doing well, room to titrate ranexa if needed. Did not tolerate imdur   2.Chronic diastolic HF - doing well, continue torsemide   3. Hyperlipidemia - not well controlled on crestor 40 - refer to lipid clinic, I assume she will need considreation for repatha or praluent  4. HTN - home check elevated, at last several clinic visits has been at goal - monitor at this time, asked to bring cuff to her next clinic visit to compare.     COVID-19 Education: The signs and symptoms of COVID-19 were discussed with the patient and how to seek care for testing (follow up with PCP or arrange E-visit).  The importance of social distancing was discussed today.  Time:   Today, I have spent 22 minutes with the patient with telehealth technology discussing the above problems.     Medication Adjustments/Labs and Tests Ordered: Current medicines are reviewed at  length with the patient today.  Concerns regarding medicines are outlined above.   Tests Ordered: No orders of the defined types were placed in this encounter.   Medication Changes: No orders of the defined types were placed in this encounter.   Follow Up:  In Person in 6 month(s)  Signed, Carlyle Dolly, MD  08/29/2019 3:47 PM    [place]

## 2019-09-03 ENCOUNTER — Other Ambulatory Visit: Payer: Self-pay | Admitting: Family Medicine

## 2019-09-03 DIAGNOSIS — N182 Chronic kidney disease, stage 2 (mild): Secondary | ICD-10-CM

## 2019-09-03 DIAGNOSIS — E1122 Type 2 diabetes mellitus with diabetic chronic kidney disease: Secondary | ICD-10-CM

## 2019-09-07 ENCOUNTER — Ambulatory Visit: Payer: Self-pay | Admitting: Family Medicine

## 2019-09-24 ENCOUNTER — Ambulatory Visit: Payer: Medicaid Other | Admitting: Cardiology

## 2019-10-02 ENCOUNTER — Other Ambulatory Visit: Payer: Self-pay | Admitting: Family Medicine

## 2019-10-02 ENCOUNTER — Other Ambulatory Visit: Payer: Self-pay | Admitting: Cardiology

## 2019-10-02 DIAGNOSIS — E1122 Type 2 diabetes mellitus with diabetic chronic kidney disease: Secondary | ICD-10-CM

## 2019-10-02 DIAGNOSIS — N182 Chronic kidney disease, stage 2 (mild): Secondary | ICD-10-CM

## 2019-10-09 ENCOUNTER — Encounter: Payer: Self-pay | Admitting: Family Medicine

## 2019-10-09 ENCOUNTER — Other Ambulatory Visit: Payer: Self-pay | Admitting: Cardiology

## 2019-10-09 ENCOUNTER — Ambulatory Visit: Payer: Medicaid Other | Admitting: Family Medicine

## 2019-10-09 ENCOUNTER — Other Ambulatory Visit: Payer: Self-pay

## 2019-10-09 VITALS — BP 109/75 | HR 89 | Temp 97.3°F | Ht 70.0 in | Wt 392.0 lb

## 2019-10-09 DIAGNOSIS — I10 Essential (primary) hypertension: Secondary | ICD-10-CM

## 2019-10-09 DIAGNOSIS — I152 Hypertension secondary to endocrine disorders: Secondary | ICD-10-CM

## 2019-10-09 DIAGNOSIS — G479 Sleep disorder, unspecified: Secondary | ICD-10-CM | POA: Diagnosis not present

## 2019-10-09 DIAGNOSIS — E785 Hyperlipidemia, unspecified: Secondary | ICD-10-CM | POA: Diagnosis not present

## 2019-10-09 DIAGNOSIS — E782 Mixed hyperlipidemia: Secondary | ICD-10-CM | POA: Diagnosis not present

## 2019-10-09 DIAGNOSIS — N182 Chronic kidney disease, stage 2 (mild): Secondary | ICD-10-CM

## 2019-10-09 DIAGNOSIS — E1159 Type 2 diabetes mellitus with other circulatory complications: Secondary | ICD-10-CM

## 2019-10-09 DIAGNOSIS — F411 Generalized anxiety disorder: Secondary | ICD-10-CM | POA: Diagnosis not present

## 2019-10-09 DIAGNOSIS — E1169 Type 2 diabetes mellitus with other specified complication: Secondary | ICD-10-CM | POA: Diagnosis not present

## 2019-10-09 DIAGNOSIS — E1122 Type 2 diabetes mellitus with diabetic chronic kidney disease: Secondary | ICD-10-CM | POA: Diagnosis not present

## 2019-10-09 LAB — BAYER DCA HB A1C WAIVED: HB A1C (BAYER DCA - WAIVED): 6.1 % (ref <7.0)

## 2019-10-09 MED ORDER — FENOFIBRATE 48 MG PO TABS: 48.0000 mg | ORAL_TABLET | Freq: Every day | ORAL | 0 refills | Status: DC

## 2019-10-09 MED ORDER — FLUOXETINE HCL 40 MG PO CAPS: 40.0000 mg | ORAL_CAPSULE | Freq: Every day | ORAL | 2 refills | Status: DC

## 2019-10-09 NOTE — Addendum Note (Signed)
Addended by: Raliegh Ip on: 10/09/2019 12:57 PM   Modules accepted: Orders

## 2019-10-09 NOTE — Progress Notes (Addendum)
Subjective: CC: DM, HTN, HLD PCP: Janora Norlander, DO [id number] D [name] is a 39 y.o. female presenting to clinic today for:  1. Type 2 Diabetes w. Neuropathy, HTN/ HLD and hx MI:  Patient reports that she has been walking more.  She reports decreased appetite with use of Prozac.  She also notes quite a bit of stress, see below.  She is taking medication(s): metformin, Victoza, cozaar, crestor, TriCor, coreg, torsemide.  Last eye exam: Due.  Notes it will be 4 months before she can get in for checkup Last foot exam: UTD Last A1c:  Lab Results  Component Value Date   HGBA1C 7.3 (H) 06/05/2019   Nephropathy screen indicated?: on ARB Last flu, zoster and/or pneumovax:  Immunization History  Administered Date(s) Administered  . Influenza Split 10/10/2011  . Influenza,inj,Quad PF,6+ Mos 10/28/2017, 06/26/2018, 06/05/2019  . Influenza-Unspecified 06/26/2018, 06/05/2019  . Pneumococcal Conjugate-13 10/28/2017  . Pneumococcal Polysaccharide-23 10/10/2011  . Tdap 01/25/2018    ROS: No chest pain, shortness of breath.  She has chronic neuropathy and is on Lyrica for this.  2. Anxiety She admits that she does feel better on fluoxetine 20 mg daily but notes that she continues to have breakthrough anxiety, particularly at nighttime.  She will be very tired and try lay down to sleep but cannot shut her mind off.  She started taking melatonin OTC but has not noticed a huge difference with this.  She was previously on Vistaril but she had to discontinue this because combined with the Lyrica it caused excessive sedation.  Symptoms have been refractory to benzodiazepines in the past.  She has never been on Buspar.  She is no longer having panic attacks in the shower, which she is pleased about.  She does note increased stress over her father in [place].  He recently required a toe amputation.  She is transporting him around to many appointments for this.  She goes on to state that she  has had a home sleep study before but it was found to be inconclusive.  She never pursued a formal sleep study.  ROS: Per HPI  Allergies  Allergen Reactions  . Bee Venom Anaphylaxis and Swelling  . Latex Itching  . Sulfa Drugs Cross Reactors Nausea And Vomiting   Past Medical History:  Diagnosis Date  . Allergy   . Anemia   . Anxiety   . CHF (congestive heart failure) ([place])   . Chronic kidney disease    history of kidney stones  . Coronary artery disease 09/2011   a.s/p BMS to LAD and angioplasty of RI in 09/2011 b. patent stent by cath in 02/2012, 12/2013, and 02/2016 with most recent showing 30-40% dLAD and 30-40% RCA stenosis  . Depression   . Diabetes mellitus   . Hyperlipidemia   . Hypertension   . Midsternal chest pain 03/03/2012  . Migraines   . Morbid obesity ([place])   . Myocardial infarct ([place]) 09/2011  . Myocardial infarction ([place]) 02/2016  . Palpitations 02/17/2012  . Sleep apnea   . Ureteral stone with hydronephrosis   . Urinary tract obstruction due to kidney stone 02/15/2017    Current Outpatient Medications:  .  ACCU-CHEK AVIVA PLUS test strip, USE TO TEST BLOOD SUGAR FOUR TIMES DAILY, Disp: 400 strip, Rfl: 3 .  aspirin 81 MG chewable tablet, Chew 81 mg by mouth daily., Disp: , Rfl:  .  blood glucose meter kit and supplies, Dispense based on patient and insurance preference. Use  up to four times daily as directed. (FOR ICD-10:  E11.22). Check BG daily, Disp: 1 each, Rfl: 0 .  carvedilol (COREG) 25 MG tablet, TAKE 1 TABLET BY MOUTH TWICE DAILY WITH A MEAL, Disp: 180 tablet, Rfl: 3 .  fenofibrate (TRICOR) 48 MG tablet, Take 1 tablet (48 mg total) by mouth daily., Disp: 90 tablet, Rfl: 0 .  FLUoxetine (PROZAC) 20 MG capsule, Take 1 capsule (20 mg total) by mouth daily., Disp: 30 capsule, Rfl: 5 .  Insulin Pen Needle (GLOBAL EASE INJECT PEN NEEDLES) 31G X 5 MM MISC, USE ONCE EVERY DAY Dx E11.9, Disp: 100 each, Rfl: 3 .  liraglutide (VICTOZA) 18 MG/3ML SOPN, INJECT 1.2  MG INTO SKIN EVERY DAY, Disp: 6 mL, Rfl: 0 .  losartan (COZAAR) 25 MG tablet, TAKE 1/2 TABLET BY MOUTH EVERY DAY, Disp: 45 tablet, Rfl: 3 .  medroxyPROGESTERone (PROVERA) 5 MG tablet, TAKE 1 TABLET BY MOUTH DAILY, Disp: 90 tablet, Rfl: 0 .  metFORMIN (GLUCOPHAGE) 1000 MG tablet, Take 1 tablet (1,000 mg total) by mouth 2 (two) times daily., Disp: 60 tablet, Rfl: 5 .  nitroGLYCERIN (NITROSTAT) 0.4 MG SL tablet, DISSOLVE 1 TABLET UNDER THE TONGUE EVERY 5 MINUTES AS NEEDED FOR CHEST PAIN. DO NOT EXCEED A TOTAL OF 3 DOSES IN 15 MINUTES. (Patient taking differently: Place 0.4 mg under the tongue every 5 (five) minutes x 3 doses as needed for chest pain. ), Disp: 25 tablet, Rfl: 3 .  potassium chloride SA (KLOR-CON) 20 MEQ tablet, TAKE 1 TABLET BY MOUTH EVERY DAY, Disp: 90 tablet, Rfl: 3 .  pregabalin (LYRICA) 100 MG capsule, TAKE ONE CAPSULE BY MOUTH TWICE DAILY, Disp: 60 capsule, Rfl: 2 .  ranolazine (RANEXA) 500 MG 12 hr tablet, TAKE 1 TABLET BY MOUTH TWICE DAILY, Disp: 180 tablet, Rfl: 3 .  rosuvastatin (CRESTOR) 40 MG tablet, TAKE ONE TABLET BY MOUTH DAILY, Disp: 90 tablet, Rfl: 3 .  torsemide (DEMADEX) 20 MG tablet, Take 40 mg by mouth daily., Disp: , Rfl:  Social History   Socioeconomic History  . Marital status: Married    Spouse name: Barnabas Lister  . Number of children: 2  . Years of education: 10  . Highest education level: Not on file  Occupational History  . Not on file  Tobacco Use  . Smoking status: Current Every Day Smoker    Packs/day: 0.50    Years: 20.00    Pack years: 10.00    Types: Cigarettes    Start date: 03/18/1990  . Smokeless tobacco: Never Used  Substance and Sexual Activity  . Alcohol use: Yes    Alcohol/week: 0.0 standard drinks    Comment: maybe once a year  . Drug use: No  . Sexual activity: Yes    Birth control/protection: None    Comment: PCOS  Other Topics Concern  . Not on file  Social History Narrative   Unemployed   Applied for disability   Leisure "take  care of house and kids"   Walks when able   Social Determinants of Health   Financial Resource Strain:   . Difficulty of Paying Living Expenses: Not on file  Food Insecurity:   . Worried About Charity fundraiser in the Last Year: Not on file  . Ran Out of Food in the Last Year: Not on file  Transportation Needs:   . Lack of Transportation (Medical): Not on file  . Lack of Transportation (Non-Medical): Not on file  Physical Activity:   . Days  of Exercise per Week: Not on file  . Minutes of Exercise per Session: Not on file  Stress:   . Feeling of Stress : Not on file  Social Connections:   . Frequency of Communication with Friends and Family: Not on file  . Frequency of Social Gatherings with Friends and Family: Not on file  . Attends Religious Services: Not on file  . Active Member of Clubs or Organizations: Not on file  . Attends Archivist Meetings: Not on file  . Marital Status: Not on file  Intimate Partner Violence:   . Fear of Current or Ex-Partner: Not on file  . Emotionally Abused: Not on file  . Physically Abused: Not on file  . Sexually Abused: Not on file   Family History  Problem Relation Age of Onset  . Heart murmur Father   . Diabetes Father   . Hypertension Father   . Hyperlipidemia Father   . Cancer Father        throat  . Diabetes Mother   . Cancer Mother        breast.uterine, ovarian  . Asthma Son   . Appendicitis Son   . Hernia Son   . Mental illness Son        Bipolar, personality d/o  . Stroke Maternal Grandmother   . Cancer Maternal Grandmother        breast  . Cancer Paternal Grandfather        lung    Objective: Office vital signs reviewed. BP 109/75   Pulse 89   Temp (!) 97.3 F (36.3 C) (Temporal)   Ht '5\' 10"'$  (1.778 m)   Wt (!) 392 lb (177.8 kg)   LMP 09/16/2019 (Approximate)   SpO2 97%   BMI 56.25 kg/m   Physical Examination:  General: Awake, alert, obese, No acute distress. Odor of tobacco HEENT: Normal,  sclera white, MMM Cardio: regular rate and rhythm, S1S2 heard, no murmurs appreciated Pulm: clear to auscultation bilaterally, no wheezes, rhonchi or rales; normal work of breathing on room air Extremities: warm, well perfused, No edema, cyanosis or clubbing; +2 pulses bilaterally Psych: Mood somewhat depressed, speech normal, affect appropriate, good eye contact Depression screen [facility] 2/9 10/09/2019 06/05/2019 10/04/2018  Decreased Interest 0 0 0  Down, Depressed, Hopeless 1 0 0  PHQ - 2 Score 1 0 0  Altered sleeping 1 0 0  Tired, decreased energy 1 0 0  Change in appetite 0 0 0  Feeling bad or failure about yourself  0 0 0  Trouble concentrating 0 0 0  Moving slowly or fidgety/restless 0 0 0  Suicidal thoughts 0 0 0  PHQ-9 Score 3 0 0  Difficult doing work/chores Not difficult at all - Not difficult at all   GAD 7 : Generalized Anxiety Score 10/09/2019 06/05/2019  Nervous, Anxious, on Edge 2 2  Control/stop worrying 1 2  Worry too much - different things 1 2  Trouble relaxing 1 3  Restless 0 0  Easily annoyed or irritable 1 3  Afraid - awful might happen 1 0  Total GAD 7 Score 7 12  Anxiety Difficulty Somewhat difficult Somewhat difficult   Assessment/ Plan: 39 y.o. female   1. Generalized anxiety disorder Not controlled. - TSH - FLUoxetine (PROZAC) 40 MG capsule; Take 1 capsule (40 mg total) by mouth daily.  Dispense: 30 capsule; Refill: 2  2. Type 2 diabetes mellitus with stage 2 chronic kidney disease, without long-term current use of insulin ([place])  Controlled.  Continue current regimen - Bayer DCA Hb A1c Waived  3. Hyperlipidemia associated with type 2 diabetes mellitus ([place]) Continue statin for secondary prevention - Lipid Panel - CMP14+EGFR - TSH  4. Hypertension associated with diabetes ([place]) Controlled. - Lipid Panel  5. Disordered sleep Previously had home sleep study which was inconclusive.  She never proceeded with formal evaluation.  Given underlying medical  conditions and body habitus I think she would very much benefit from formal sleep study.  Referral to neurology placed.  Patient aware of the referral and will make an appointment - Ambulatory referral to Neurology   No orders of the defined types were placed in this encounter.  No orders of the defined types were placed in this encounter.   Janora Norlander, DO [place] [phone]

## 2019-10-09 NOTE — Patient Instructions (Addendum)
Ok to increase Melatonin to 6mg  if needed. We increased the Prozac to 40mg . See me in 6 weeks for follow up on anxiety.  Telephone visit is ok.  A1c 6.1 today.  You've lost 10lbs since last visit.  I am sending you for a sleep study.  Get this set up when you can.   Sleep Apnea Sleep apnea is a condition in which breathing pauses or becomes shallow during sleep. Episodes of sleep apnea usually last 10 seconds or longer, and they may occur as many as 20 times an hour. Sleep apnea disrupts your sleep and keeps your body from getting the rest that it needs. This condition can increase your risk of certain health problems, including:  Heart attack.  Stroke.  Obesity.  Diabetes.  Heart failure.  Irregular heartbeat. What are the causes? There are three kinds of sleep apnea:  Obstructive sleep apnea. This kind is caused by a blocked or collapsed airway.  Central sleep apnea. This kind happens when the part of the brain that controls breathing does not send the correct signals to the muscles that control breathing.  Mixed sleep apnea. This is a combination of obstructive and central sleep apnea. The most common cause of this condition is a collapsed or blocked airway. An airway can collapse or become blocked if:  Your throat muscles are abnormally relaxed.  Your tongue and tonsils are larger than normal.  You are overweight.  Your airway is smaller than normal. What increases the risk? You are more likely to develop this condition if you:  Are overweight.  Smoke.  Have a smaller than normal airway.  Are elderly.  Are female.  Drink alcohol.  Take sedatives or tranquilizers.  Have a family history of sleep apnea. What are the signs or symptoms? Symptoms of this condition include:  Trouble staying asleep.  Daytime sleepiness and tiredness.  Irritability.  Loud snoring.  Morning headaches.  Trouble concentrating.  Forgetfulness.  Decreased interest in  sex.  Unexplained sleepiness.  Mood swings.  Personality changes.  Feelings of depression.  Waking up often during the night to urinate.  Dry mouth.  Sore throat. How is this diagnosed? This condition may be diagnosed with:  A medical history.  A physical exam.  A series of tests that are done while you are sleeping (sleep study). These tests are usually done in a sleep lab, but they may also be done at home. How is this treated? Treatment for this condition aims to restore normal breathing and to ease symptoms during sleep. It may involve managing health issues that can affect breathing, such as high blood pressure or obesity. Treatment may include:  Sleeping on your side.  Using a decongestant if you have nasal congestion.  Avoiding the use of depressants, including alcohol, sedatives, and narcotics.  Losing weight if you are overweight.  Making changes to your diet.  Quitting smoking.  Using a device to open your airway while you sleep, such as: ? An oral appliance. This is a custom-made mouthpiece that shifts your lower jaw forward. ? A continuous positive airway pressure (CPAP) device. This device blows air through a mask when you breathe out (exhale). ? A nasal expiratory positive airway pressure (EPAP) device. This device has valves that you put into each nostril. ? A bi-level positive airway pressure (BPAP) device. This device blows air through a mask when you breathe in (inhale) and breathe out (exhale).  Having surgery if other treatments do not work. During surgery, excess  tissue is removed to create a wider airway. It is important to get treatment for sleep apnea. Without treatment, this condition can lead to:  High blood pressure.  Coronary artery disease.  In men, an inability to achieve or maintain an erection (impotence).  Reduced thinking abilities. Follow these instructions at home: Lifestyle  Make any lifestyle changes that your health care  provider recommends.  Eat a healthy, well-balanced diet.  Take steps to lose weight if you are overweight.  Avoid using depressants, including alcohol, sedatives, and narcotics.  Do not use any products that contain nicotine or tobacco, such as cigarettes, e-cigarettes, and chewing tobacco. If you need help quitting, ask your health care provider. General instructions  Take over-the-counter and prescription medicines only as told by your health care provider.  If you were given a device to open your airway while you sleep, use it only as told by your health care provider.  If you are having surgery, make sure to tell your health care provider you have sleep apnea. You may need to bring your device with you.  Keep all follow-up visits as told by your health care provider. This is important. Contact a health care provider if:  The device that you received to open your airway during sleep is uncomfortable or does not seem to be working.  Your symptoms do not improve.  Your symptoms get worse. Get help right away if:  You develop: ? Chest pain. ? Shortness of breath. ? Discomfort in your back, arms, or stomach.  You have: ? Trouble speaking. ? Weakness on one side of your body. ? Drooping in your face. These symptoms may represent a serious problem that is an emergency. Do not wait to see if the symptoms will go away. Get medical help right away. Call your local emergency services (911 in the [place] Do not drive yourself to the hospital. Summary  Sleep apnea is a condition in which breathing pauses or becomes shallow during sleep.  The most common cause is a collapsed or blocked airway.  The goal of treatment is to restore normal breathing and to ease symptoms during sleep. This information is not intended to replace advice given to you by your health care provider. Make sure you discuss any questions you have with your health care provider. Document Revised: 02/28/2019  Document Reviewed: 05/09/2018 Elsevier Patient Education  [place].

## 2019-10-10 LAB — CMP14+EGFR
ALT: 23 IU/L (ref 0–32)
AST: 16 IU/L (ref 0–40)
Albumin/Globulin Ratio: 1.4 (ref 1.2–2.2)
Albumin: 3.9 g/dL (ref 3.8–4.8)
Alkaline Phosphatase: 83 IU/L (ref 39–117)
BUN/Creatinine Ratio: 10 (ref 9–23)
BUN: 8 mg/dL (ref 6–24)
Bilirubin Total: 0.3 mg/dL (ref 0.0–1.2)
CO2: 18 mmol/L — ABNORMAL LOW (ref 20–29)
Calcium: 9.1 mg/dL (ref 8.7–10.2)
Chloride: 103 mmol/L (ref 96–106)
Creatinine, Ser: 0.82 mg/dL (ref 0.57–1.00)
GFR calc Af Amer: 104 mL/min/{1.73_m2} (ref >59)
GFR calc non Af Amer: 90 mL/min/{1.73_m2} (ref >59)
Globulin, Total: 2.7 g/dL (ref 1.5–4.5)
Glucose: 98 mg/dL (ref 65–99)
Potassium: 3.9 mmol/L (ref 3.5–5.2)
Sodium: 137 mmol/L (ref 134–144)
Total Protein: 6.6 g/dL (ref 6.0–8.5)

## 2019-10-10 LAB — LIPID PANEL
Chol/HDL Ratio: 4 ratio (ref 0.0–4.4)
Cholesterol, Total: 127 mg/dL (ref 100–199)
HDL: 32 mg/dL — ABNORMAL LOW (ref >39)
LDL Chol Calc (NIH): 66 mg/dL (ref 0–99)
Triglycerides: 171 mg/dL — ABNORMAL HIGH (ref 0–149)
VLDL Cholesterol Cal: 29 mg/dL (ref 5–40)

## 2019-10-10 LAB — TSH: TSH: 1.23 u[IU]/mL (ref 0.450–4.500)

## 2019-10-15 ENCOUNTER — Other Ambulatory Visit: Payer: Self-pay | Admitting: Family Medicine

## 2019-10-15 ENCOUNTER — Encounter: Payer: Self-pay | Admitting: Family Medicine

## 2019-10-15 DIAGNOSIS — G479 Sleep disorder, unspecified: Secondary | ICD-10-CM

## 2019-10-19 ENCOUNTER — Other Ambulatory Visit (HOSPITAL_BASED_OUTPATIENT_CLINIC_OR_DEPARTMENT_OTHER): Payer: Self-pay

## 2019-10-19 DIAGNOSIS — R0683 Snoring: Secondary | ICD-10-CM

## 2019-10-19 DIAGNOSIS — G4730 Sleep apnea, unspecified: Secondary | ICD-10-CM

## 2019-10-19 DIAGNOSIS — G4710 Hypersomnia, unspecified: Secondary | ICD-10-CM

## 2019-10-22 ENCOUNTER — Other Ambulatory Visit: Payer: Self-pay

## 2019-10-22 ENCOUNTER — Other Ambulatory Visit (HOSPITAL_COMMUNITY)
Admission: RE | Admit: 2019-10-22 | Discharge: 2019-10-22 | Disposition: A | Discharge status: Home / Self Care | Payer: Medicaid Other | Source: Ambulatory Visit | Attending: Family Medicine | Admitting: Family Medicine

## 2019-10-22 DIAGNOSIS — Z01812 Encounter for preprocedural laboratory examination: Secondary | ICD-10-CM | POA: Diagnosis present

## 2019-10-22 DIAGNOSIS — Z20822 Contact with and (suspected) exposure to covid-19: Secondary | ICD-10-CM | POA: Diagnosis not present

## 2019-10-22 LAB — SARS CORONAVIRUS 2 (TAT 6-24 HRS): SARS Coronavirus 2: NEGATIVE

## 2019-10-24 ENCOUNTER — Telehealth: Payer: Self-pay | Admitting: Family Medicine

## 2019-10-24 NOTE — Telephone Encounter (Signed)
REFERRAL REQUEST Telephone Note  What type of referral do you need? Sleep study at home  Have you been seen at our office for this problem? Yes, she had one scheduled for today but had to cancel due to not knowing what weather will do (Advise that they will likely need an appointment with their PCP before a referral can be done)  Is there a particular doctor or location that you prefer? Would like to have it done at home   Patient notified that referrals can take up to a week or longer to process. If they haven't heard anything within a week they should call back and speak with the referral department.

## 2019-10-29 ENCOUNTER — Other Ambulatory Visit: Payer: Self-pay | Admitting: Cardiology

## 2019-10-29 ENCOUNTER — Other Ambulatory Visit: Payer: Self-pay | Admitting: Family Medicine

## 2019-10-29 DIAGNOSIS — E1122 Type 2 diabetes mellitus with diabetic chronic kidney disease: Secondary | ICD-10-CM

## 2019-10-30 NOTE — Telephone Encounter (Signed)
A home sleep study will not be sufficient for patient.  She needs an in office sleep study because she will likely need titration of CPAP which cannot be done at home

## 2019-10-30 NOTE — Telephone Encounter (Signed)
Patients husband aware and verbalized understanding. They will call back when she is ready to do a in office one she is not wanting another COVID test and is having to take care of her father in law

## 2019-11-05 ENCOUNTER — Ambulatory Visit: Payer: Medicaid Other | Admitting: Internal Medicine

## 2019-11-08 ENCOUNTER — Other Ambulatory Visit: Payer: Self-pay | Admitting: Cardiology

## 2019-11-19 ENCOUNTER — Ambulatory Visit (INDEPENDENT_AMBULATORY_CARE_PROVIDER_SITE_OTHER): Payer: Medicaid Other | Admitting: Family Medicine

## 2019-11-19 DIAGNOSIS — G479 Sleep disorder, unspecified: Secondary | ICD-10-CM

## 2019-11-19 DIAGNOSIS — F411 Generalized anxiety disorder: Secondary | ICD-10-CM | POA: Diagnosis not present

## 2019-11-19 NOTE — Progress Notes (Signed)
Telephone visit  Subjective: CC: Follow-up anxiety disorder PCP: Janora Norlander, DO [id number] D Milks is a 39 y.o. female calls for telephone consult today. Patient provides verbal consent for consult held via phone.  Due to COVID-19 pandemic this visit was conducted virtually. This visit type was conducted due to national recommendations for restrictions regarding the COVID-19 Pandemic (e.g. social distancing, sheltering in place) in an effort to limit this patient's exposure and mitigate transmission in our community. All issues noted in this document were discussed and addressed.  A physical exam was not performed with this format.   Location of patient: home Location of provider: Working remotely from home Others present for call: husband  1.  Generalized disorder Last visit we increased her fluoxetine to 40 mg daily she is having uncontrolled anxiety and panic.  She was also sent for sleep study given reports of disordered sleep.  Today she notes that she was going to get the sleep study but there was a snow storm so she plans on rescheduling it.  She's not done this yet due to caring for her father in law.    She reports that anxiety attacks during the day have improved and are "under control now".  She continues to have sleep difficulty with sleep.  She can get to sleep but cannot stay asleep.   ROS: Per HPI  Allergies  Allergen Reactions  . Bee Venom Anaphylaxis and Swelling  . Latex Itching  . Sulfa Drugs Cross Reactors Nausea And Vomiting   Past Medical History:  Diagnosis Date  . Allergy   . Anemia   . Anxiety   . CHF (congestive heart failure) ([place])   . Chronic kidney disease    history of kidney stones  . Coronary artery disease 09/2011   a.s/p BMS to LAD and angioplasty of RI in 09/2011 b. patent stent by cath in 02/2012, 12/2013, and 02/2016 with most recent showing 30-40% dLAD and 30-40% RCA stenosis  . Depression   . Diabetes mellitus   . Hyperlipidemia    . Hypertension   . Midsternal chest pain 03/03/2012  . Migraines   . Morbid obesity ([place])   . Myocardial infarct ([place]) 09/2011  . Myocardial infarction ([place]) 02/2016  . Palpitations 02/17/2012  . Sleep apnea   . Ureteral stone with hydronephrosis   . Urinary tract obstruction due to kidney stone 02/15/2017    Current Outpatient Medications:  .  ACCU-CHEK AVIVA PLUS test strip, USE TO TEST BLOOD SUGAR FOUR TIMES DAILY, Disp: 400 strip, Rfl: 3 .  aspirin 81 MG chewable tablet, Chew 81 mg by mouth daily., Disp: , Rfl:  .  blood glucose meter kit and supplies, Dispense based on patient and insurance preference. Use up to four times daily as directed. (FOR ICD-10:  E11.22). Check BG daily, Disp: 1 each, Rfl: 0 .  carvedilol (COREG) 25 MG tablet, TAKE 1 TABLET BY MOUTH TWICE DAILY WITH A MEAL, Disp: 180 tablet, Rfl: 3 .  fenofibrate (TRICOR) 48 MG tablet, Take 1 tablet (48 mg total) by mouth daily., Disp: 90 tablet, Rfl: 0 .  FLUoxetine (PROZAC) 40 MG capsule, Take 1 capsule (40 mg total) by mouth daily., Disp: 30 capsule, Rfl: 2 .  Insulin Pen Needle (GLOBAL EASE INJECT PEN NEEDLES) 31G X 5 MM MISC, USE ONCE EVERY DAY Dx E11.9, Disp: 100 each, Rfl: 3 .  losartan (COZAAR) 25 MG tablet, TAKE 1/2 TABLET BY MOUTH EVERY DAY, Disp: 45 tablet, Rfl: 3 .  medroxyPROGESTERone (  PROVERA) 5 MG tablet, TAKE 1 TABLET BY MOUTH DAILY, Disp: 90 tablet, Rfl: 0 .  metFORMIN (GLUCOPHAGE) 1000 MG tablet, Take 1 tablet (1,000 mg total) by mouth 2 (two) times daily., Disp: 60 tablet, Rfl: 5 .  nitroGLYCERIN (NITROSTAT) 0.4 MG SL tablet, DISSOLVE 1 TABLET UNDER THE TONGUE EVERY 5 MINUTES AS NEEDED FOR CHEST PAIN. DO NOT EXCEED A TOTAL OF 3 DOSES IN 15 MINUTES., Disp: 25 tablet, Rfl: 3 .  potassium chloride SA (KLOR-CON) 20 MEQ tablet, TAKE 1 TABLET BY MOUTH EVERY DAY, Disp: 90 tablet, Rfl: 3 .  pregabalin (LYRICA) 100 MG capsule, TAKE ONE CAPSULE BY MOUTH TWICE DAILY, Disp: 60 capsule, Rfl: 2 .  ranolazine (RANEXA) 500  MG 12 hr tablet, TAKE 1 TABLET BY MOUTH TWICE DAILY, Disp: 180 tablet, Rfl: 3 .  rosuvastatin (CRESTOR) 40 MG tablet, TAKE 1 TABLET BY MOUTH EVERY DAY, Disp: 90 tablet, Rfl: 3 .  torsemide (DEMADEX) 20 MG tablet, Take 40 mg by mouth daily as needed. , Disp: , Rfl:  .  VICTOZA 18 MG/3ML SOPN, INJECT 1.2 MG INTO SKIN EVERY DAY, Disp: 6 mL, Rfl: 0   Psych: speech normal, pleasant, thought process linear  Assessment/ Plan: 39 y.o. female   1. Generalized anxiety disorder Stabilized w/ prozac 22m daily.  Continue current therapy.  Follow-up in 3 months, sooner if needed  2. Disordered sleep Recommend rescheduling sleep study.  She will plan on doing this.  She is thinking about trying to get the morning cream for again but is going to discuss with her husband first as she is the only 1 with a driver's license and she worries about transportation issues with the rest of the family   Start time: 1:00pm End time: 1:13pm  Total time spent on patient care (including telephone call/ virtual visit): 20 minutes  [place]([phone]

## 2019-11-28 ENCOUNTER — Other Ambulatory Visit: Payer: Self-pay | Admitting: Family Medicine

## 2019-11-28 DIAGNOSIS — E1140 Type 2 diabetes mellitus with diabetic neuropathy, unspecified: Secondary | ICD-10-CM

## 2019-12-04 ENCOUNTER — Other Ambulatory Visit: Payer: Self-pay | Admitting: Family Medicine

## 2019-12-04 DIAGNOSIS — N182 Chronic kidney disease, stage 2 (mild): Secondary | ICD-10-CM

## 2019-12-04 DIAGNOSIS — E1122 Type 2 diabetes mellitus with diabetic chronic kidney disease: Secondary | ICD-10-CM

## 2019-12-06 ENCOUNTER — Other Ambulatory Visit: Payer: Self-pay | Admitting: Family Medicine

## 2019-12-06 DIAGNOSIS — E1140 Type 2 diabetes mellitus with diabetic neuropathy, unspecified: Secondary | ICD-10-CM

## 2019-12-06 NOTE — Telephone Encounter (Signed)
Last office visit 11/19/2019 Last refill 08/28/2019, #60, 2 refills

## 2019-12-14 ENCOUNTER — Ambulatory Visit: Payer: Medicaid Other | Admitting: Internal Medicine

## 2019-12-27 ENCOUNTER — Other Ambulatory Visit: Payer: Self-pay | Admitting: Family Medicine

## 2019-12-27 ENCOUNTER — Encounter: Payer: Self-pay | Admitting: Internal Medicine

## 2019-12-27 ENCOUNTER — Telehealth (INDEPENDENT_AMBULATORY_CARE_PROVIDER_SITE_OTHER): Payer: Medicaid Other | Admitting: Internal Medicine

## 2019-12-27 DIAGNOSIS — F411 Generalized anxiety disorder: Secondary | ICD-10-CM

## 2019-12-27 DIAGNOSIS — N182 Chronic kidney disease, stage 2 (mild): Secondary | ICD-10-CM | POA: Diagnosis not present

## 2019-12-27 DIAGNOSIS — E785 Hyperlipidemia, unspecified: Secondary | ICD-10-CM | POA: Diagnosis not present

## 2019-12-27 DIAGNOSIS — I2510 Atherosclerotic heart disease of native coronary artery without angina pectoris: Secondary | ICD-10-CM | POA: Diagnosis not present

## 2019-12-27 DIAGNOSIS — I25118 Atherosclerotic heart disease of native coronary artery with other forms of angina pectoris: Secondary | ICD-10-CM

## 2019-12-27 DIAGNOSIS — E1122 Type 2 diabetes mellitus with diabetic chronic kidney disease: Secondary | ICD-10-CM

## 2019-12-27 MED ORDER — ICOSAPENT ETHYL 1 G PO CAPS: 2.0000 g | ORAL_CAPSULE | Freq: Two times a day (BID) | ORAL | 11 refills | Status: DC

## 2019-12-27 NOTE — Progress Notes (Signed)
Virtual Visit via Telephone Note   This visit type was conducted due to national recommendations for restrictions regarding the COVID-19 Pandemic (e.g. social distancing) in an effort to limit this patient's exposure and mitigate transmission in our community.  Due to her co-morbid illnesses, this patient is at least at moderate risk for complications without adequate follow up.  This format is felt to be most appropriate for this patient at this time.  The patient did not have access to video technology/had technical difficulties with video requiring transitioning to audio format only (telephone).  All issues noted in this document were discussed and addressed.  No physical exam could be performed with this format.  Please refer to the patient's chart for her  consent to telehealth for [facility].   Evaluation Performed:  Telephone visit  Date:  12/27/2019   ID:  [name], DOB June 22, 1981, MRN [id number]  Patient Location:  [address] [place] [address]  Provider location:   [address], [place], [place] [address]  PCP:  Janora Norlander, DO  Cardiologist:  Carlyle Dolly, MD Electrophysiologist:  None   Chief Complaint:  Lipid management  History of Present Illness:    [name] is a 39 y.o. female who presents via audio/video conferencing for a telehealth visit today.  This is a pleasant 39 year old female with a history of coronary artery disease dating back to 2013 at which time she received a bare-metal stent to the LAD and angioplasty to the ramus intermedius.  Since then she has had multiple cardiac catheterizations, most recently in 2019 which showed some smaller vessel disease off of the LAD which was not amenable to PCI.  There is a strong family history of coronary disease in both parents.  Most recently her lipids were [place] not at goal 6 months ago which showed total cholesterol 213, triglycerides 232, HDL 30 and LDL 141.  Since  then she has had improved glycemic control, she has been on Victoza and lost about 30 pounds and made some dietary changes although reported she read he was eating a "healthy" diet.  Those changes, coupled with taking rosuvastatin 40 mg daily and low-dose fenofibrate, have significantly improved her numbers.  Her labs 2 months ago showed total cholesterol 127, triglycerides 171, HDL 32 and LDL was at target at 66.  The patient does not have symptoms concerning for COVID-19 infection (fever, chills, cough, or new SHORTNESS OF BREATH).    Prior CV studies:   The following studies were reviewed today:  Chart reviewed Lab work  PMHx:  Past Medical History:  Diagnosis Date  . Allergy   . Anemia   . Anxiety   . CHF (congestive heart failure) ([place])   . Chronic kidney disease    history of kidney stones  . Coronary artery disease 09/2011   a.s/p BMS to LAD and angioplasty of RI in 09/2011 b. patent stent by cath in 02/2012, 12/2013, and 02/2016 with most recent showing 30-40% dLAD and 30-40% RCA stenosis  . Depression   . Diabetes mellitus   . Hyperlipidemia   . Hypertension   . Midsternal chest pain 03/03/2012  . Migraines   . Morbid obesity ([place])   . Myocardial infarct ([place]) 09/2011  . Myocardial infarction ([place]) 02/2016  . Palpitations 02/17/2012  . Sleep apnea   . Ureteral stone with hydronephrosis   . Urinary tract obstruction due to kidney stone 02/15/2017    Past Surgical History:  Procedure Laterality  Date  . CARDIAC CATHETERIZATION  10/08/2011   LAD: 95% mid, Ramus: 95% ostial  . CARPAL TUNNEL RELEASE    . CESAREAN SECTION    . CHOLECYSTECTOMY    . CORONARY ANGIOPLASTY WITH STENT PLACEMENT  10/08/2011   LAD: BMS, Ramus: cutting balloon angioplasty  . CYSTOSCOPY WITH RETROGRADE PYELOGRAM, URETEROSCOPY AND STENT PLACEMENT Left 02/16/2017   Procedure: CYSTOSCOPY WITH RETROGRADE PYELOGRAM, URETEROSCOPY AND STENT PLACEMENT;  Surgeon: Kathie Rhodes, MD;  Location: [facility];  Service:  Urology;  Laterality: Left;  . CYSTOSCOPY WITH RETROGRADE PYELOGRAM, URETEROSCOPY AND STENT PLACEMENT Left 03/28/2017   Procedure: LEFT STENT REMOVAL LEFT RETROGRADE PYELOGRAM LEFT URETEROSCOPY   LASER LITHOTRIPSY  AND  STENT REPLACEMENT;  Surgeon: Cleon Gustin, MD;  Location: [facility];  Service: Urology;  Laterality: Left;  . LEFT HEART CATH AND CORONARY ANGIOGRAPHY N/A 08/15/2018   Procedure: LEFT HEART CATH AND CORONARY ANGIOGRAPHY;  Surgeon: Martinique, Peter M, MD;  Location: [facility];  Service: Cardiovascular;  Laterality: N/A;  . LEFT HEART CATHETERIZATION WITH CORONARY ANGIOGRAM N/A 10/08/2011   Procedure: LEFT HEART CATHETERIZATION WITH CORONARY ANGIOGRAM;  Surgeon: Peter M Martinique, MD;  Location: [facility] CATH LAB;  Service: Cardiovascular;  Laterality: N/A;  . LEFT HEART CATHETERIZATION WITH CORONARY ANGIOGRAM N/A 03/02/2012   Procedure: LEFT HEART CATHETERIZATION WITH CORONARY ANGIOGRAM;  Surgeon: Burnell Blanks, MD;  Location: [facility] CATH LAB;  Service: Cardiovascular;  Laterality: N/A;  . LEFT HEART CATHETERIZATION WITH CORONARY ANGIOGRAM N/A 01/04/2014   Procedure: LEFT HEART CATHETERIZATION WITH CORONARY ANGIOGRAM;  Surgeon: Troy Sine, MD;  Location: [facility] CATH LAB;  Service: Cardiovascular;  Laterality: N/A;    FAMHx:  Family History  Problem Relation Age of Onset  . Heart murmur Father   . Diabetes Father   . Hypertension Father   . Hyperlipidemia Father   . Cancer Father        throat  . Diabetes Mother   . Cancer Mother        breast.uterine, ovarian  . Asthma Son   . Appendicitis Son   . Hernia Son   . Mental illness Son        Bipolar, personality d/o  . Stroke Maternal Grandmother   . Cancer Maternal Grandmother        breast  . Cancer Paternal Grandfather        lung    SOCHx:   reports that she has been smoking cigarettes. She started smoking about 29 years ago. She has a 10.00 pack-year smoking history. She has never used smokeless tobacco. She  reports current alcohol use. She reports that she does not use drugs.  ALLERGIES:  Allergies  Allergen Reactions  . Bee Venom Anaphylaxis and Swelling  . Latex Itching  . Sulfa Drugs Cross Reactors Nausea And Vomiting    MEDS:  Current Meds  Medication Sig  . ACCU-CHEK AVIVA PLUS test strip USE TO TEST BLOOD SUGAR FOUR TIMES DAILY  . aspirin 81 MG chewable tablet Chew 81 mg by mouth daily.  . blood glucose meter kit and supplies Dispense based on patient and insurance preference. Use up to four times daily as directed. (FOR ICD-10:  E11.22). Check BG daily  . carvedilol (COREG) 25 MG tablet TAKE 1 TABLET BY MOUTH TWICE DAILY WITH A MEAL  . fenofibrate (TRICOR) 48 MG tablet Take 1 tablet (48 mg total) by mouth daily.  Marland Kitchen FLUoxetine (PROZAC) 40 MG capsule Take 1 capsule (40 mg total) by mouth daily.  Marland Kitchen  Insulin Pen Needle (GLOBAL EASE INJECT PEN NEEDLES) 31G X 5 MM MISC USE ONCE EVERY DAY Dx E11.9  . losartan (COZAAR) 25 MG tablet TAKE 1/2 TABLET BY MOUTH EVERY DAY  . medroxyPROGESTERone (PROVERA) 5 MG tablet TAKE 1 TABLET BY MOUTH DAILY  . metFORMIN (GLUCOPHAGE) 1000 MG tablet TAKE 1 TABLET BY MOUTH TWICE DAILY  . nitroGLYCERIN (NITROSTAT) 0.4 MG SL tablet DISSOLVE 1 TABLET UNDER THE TONGUE EVERY 5 MINUTES AS NEEDED FOR CHEST PAIN. DO NOT EXCEED A TOTAL OF 3 DOSES IN 15 MINUTES.  Marland Kitchen potassium chloride SA (KLOR-CON) 20 MEQ tablet TAKE 1 TABLET BY MOUTH EVERY DAY  . pregabalin (LYRICA) 100 MG capsule TAKE ONE CAPSULE BY MOUTH TWICE DAILY  . ranolazine (RANEXA) 500 MG 12 hr tablet TAKE 1 TABLET BY MOUTH TWICE DAILY  . rosuvastatin (CRESTOR) 40 MG tablet TAKE 1 TABLET BY MOUTH EVERY DAY  . torsemide (DEMADEX) 20 MG tablet Take 40 mg by mouth daily as needed.   Marland Kitchen VICTOZA 18 MG/3ML SOPN INJECT 1.2 MG INTO SKIN EVERY DAY     ROS: Pertinent items noted in HPI and remainder of comprehensive ROS otherwise negative.  Labs/Other Tests and Data Reviewed:    Recent Labs: 06/05/2019: Hemoglobin  14.2; Platelets 266 10/09/2019: ALT 23; BUN 8; Creatinine, Ser 0.82; Potassium 3.9; Sodium 137; TSH 1.230   Recent Lipid Panel Lab Results  Component Value Date/Time   CHOL 127 10/09/2019 08:49 AM   TRIG 171 (H) 10/09/2019 08:49 AM   HDL 32 (L) 10/09/2019 08:49 AM   CHOLHDL 4.0 10/09/2019 08:49 AM   CHOLHDL 6.2 (H) 10/28/2017 12:21 PM   LDLCALC 66 10/09/2019 08:49 AM   LDLCALC 122 (H) 10/28/2017 12:21 PM    Wt Readings from Last 3 Encounters:  10/09/19 (!) 392 lb (177.8 kg)  08/29/19 (!) 394 lb (178.7 kg)  06/05/19 (!) 401 lb (181.9 kg)     Exam:    Vital Signs:  There were no vitals taken for this visit.   Deferred  ASSESSMENT & PLAN:    1. Mixed dyslipidemia, goal LDL less than 70 2. Coronary artery disease with prior PCI 3. Type 2 diabetes-hemoglobin A1c 6.1 4. Morbid obesity with recent 30 pound weight loss  Ms. Berkland has significant premature coronary disease which likely has a large genetic component given her family history.  This is complicated by her morbid obesity, type 2 diabetes and dyslipidemia.  Recently her numbers have improved significantly, probably primarily with weight loss and adjustment in her medications.  Her LDL is now at target however triglycerides remain elevated.  Recent data from the REDUCE-IT trial in 2019, demonstrated a 25 to 30% additional risk reduction in cardiovascular events for patients on a statin with well-controlled LDL cholesterol who had persistently elevated triglycerides over 150.  She is a great candidate for this for cardiovascular risk reduction as we do not see a significant risk reduction with fenofibrate despite the fact that it does lower triglycerides.  Would recommend adding 2 g twice daily and repeat lipids in about 3 months.  Follow-up with me at that time.  Thanks again for the kind referral.  COVID-19 Education: The signs and symptoms of COVID-19 were discussed with the patient and how to seek care for testing (follow up  with PCP or arrange E-visit).  The importance of social distancing was discussed today.  Patient Risk:   After full review of this patients clinical status, I feel that they are at least moderate risk at  this time.  Time:   Today, I have spent 25 minutes with the patient with telehealth technology discussing dyslipidemia, type 2 diabetes, coronary disease, weight loss, high triglycerides.     Medication Adjustments/Labs and Tests Ordered: Current medicines are reviewed at length with the patient today.  Concerns regarding medicines are outlined above.   Tests Ordered: No orders of the defined types were placed in this encounter.   Medication Changes: No orders of the defined types were placed in this encounter.   Disposition:  in 3 month(s)  Pixie Casino, MD, [facility], [place] Director of the Advanced Lipid Disorders &  Cardiovascular Risk Reduction Clinic Diplomate of the American Board of Clinical Lipidology Attending Cardiologist  Direct Dial: [phone]  Fax: [phone]  Website:  [email or web address]  Pixie Casino, MD  12/27/2019 8:22 AM

## 2019-12-27 NOTE — Patient Instructions (Signed)
Medication Instructions:  START vascepa 2 capsules twice daily Continue other current medications  *If you need a refill on your cardiac medications before your next appointment, please call your pharmacy*   Lab Work: FASTING lab work in 3 months to check cholesterol   If you have labs (blood work) drawn today and your tests are completely normal, you will receive your results only by: Marland Kitchen MyChart Message (if you have MyChart) OR . A paper copy in the mail If you have any lab test that is abnormal or we need to change your treatment, we will call you to review the results.   Testing/Procedures: NONE   Follow-Up: At [facility], you and your health needs are our priority.  As part of our continuing mission to provide you with exceptional heart care, we have created designated Provider Care Teams.  These Care Teams include your primary Cardiologist (physician) and Advanced Practice Providers (APPs -  Physician Assistants and Nurse Practitioners) who all work together to provide you with the care you need, when you need it.  We recommend signing up for the patient portal called "MyChart".  Sign up information is provided on this After Visit Summary.  MyChart is used to connect with patients for Virtual Visits (Telemedicine).  Patients are able to view lab/test results, encounter notes, upcoming appointments, etc.  Non-urgent messages can be sent to your provider as well.   To learn more about what you can do with MyChart, go to [email or web address].    Your next appointment:   3 month(s) - lipid clinic  The format for your next appointment:   Either In Person or Virtual  Provider:   K. [place] Hilty, MD   Other Instructions

## 2019-12-28 ENCOUNTER — Ambulatory Visit: Payer: Medicaid Other | Admitting: Internal Medicine

## 2019-12-28 ENCOUNTER — Telehealth: Payer: Self-pay | Admitting: Internal Medicine

## 2019-12-28 NOTE — Telephone Encounter (Signed)
PA for vascepa submitted via [facility] portal for [place] Medicaid Confirmation #:[id number] W Prior Approval #:[id number] Status:APPROVED

## 2020-01-01 ENCOUNTER — Other Ambulatory Visit: Payer: Self-pay | Admitting: Family Medicine

## 2020-01-01 DIAGNOSIS — E1122 Type 2 diabetes mellitus with diabetic chronic kidney disease: Secondary | ICD-10-CM

## 2020-01-01 DIAGNOSIS — E785 Hyperlipidemia, unspecified: Secondary | ICD-10-CM

## 2020-01-01 DIAGNOSIS — E1169 Type 2 diabetes mellitus with other specified complication: Secondary | ICD-10-CM

## 2020-01-07 MED ORDER — ICOSAPENT ETHYL 1 G PO CAPS: 2.0000 g | ORAL_CAPSULE | Freq: Two times a day (BID) | ORAL | 11 refills | Status: DC

## 2020-01-07 NOTE — Addendum Note (Signed)
Addended by: Lindell Spar on: 01/07/2020 03:50 PM   Modules accepted: Orders

## 2020-01-07 NOTE — Telephone Encounter (Signed)
Rx sent to pharmacy for DAW

## 2020-01-07 NOTE — Telephone Encounter (Signed)
Follow Up  Received a phone call from [facility] and they said that a new prescription needs to be sent in for the Vascepa with "DAW1" on it and then Medicaid will pay for it

## 2020-01-09 ENCOUNTER — Telehealth: Payer: Self-pay

## 2020-01-09 MED ORDER — RANOLAZINE ER 1000 MG PO TB12: 1000.0000 mg | ORAL_TABLET | Freq: Two times a day (BID) | ORAL | 3 refills | Status: DC

## 2020-01-09 NOTE — Telephone Encounter (Signed)
If her pain had been doing well and not suddenly its progressing would be worried about a change in her heart. I would suggest she have an ER evaluation if symptoms are that severe. If mild and resolving then yes can increase ranexa to 1000mg  bid and could do a virtual visit on Friday. If ongoing significant symptoms needs to come for ER evaluation   J Dreon Pineda MD

## 2020-01-09 NOTE — Telephone Encounter (Signed)
Pt called in stating she has had to take a few nitro over the past several days. She stated she was having lots of chest pressure, sob and pain in right arm like she did prior to her STEMI several years ago. She stated after taking the nitro the pressure stopped. She wonders if her ranexa may need to be increased. PLease advise.

## 2020-01-09 NOTE — Telephone Encounter (Signed)
Spoke with pt, informed her of recommendations. She voiced understanding. She states that she will go to ED if the symptoms return and are as severe as before. She would try the increased Ranexa dose. Sent RX in to [facility]. She wanted an in person visit verses virtual. Made appointment with Randall An, PA-C  For 4/20.

## 2020-01-13 ENCOUNTER — Inpatient Hospital Stay (HOSPITAL_COMMUNITY)
Admission: EM | Admit: 2020-01-13 | Discharge: 2020-01-15 | DRG: 247 | Disposition: A | Discharge status: Home / Self Care | Payer: Medicaid Other | Attending: Cardiology | Admitting: Cardiology

## 2020-01-13 ENCOUNTER — Other Ambulatory Visit (HOSPITAL_COMMUNITY): Payer: Self-pay

## 2020-01-13 ENCOUNTER — Emergency Department (HOSPITAL_COMMUNITY): Payer: Medicaid Other

## 2020-01-13 ENCOUNTER — Other Ambulatory Visit: Payer: Self-pay

## 2020-01-13 DIAGNOSIS — Z7982 Long term (current) use of aspirin: Secondary | ICD-10-CM

## 2020-01-13 DIAGNOSIS — E782 Mixed hyperlipidemia: Secondary | ICD-10-CM | POA: Diagnosis present

## 2020-01-13 DIAGNOSIS — Z91030 Bee allergy status: Secondary | ICD-10-CM

## 2020-01-13 DIAGNOSIS — D72829 Elevated white blood cell count, unspecified: Secondary | ICD-10-CM | POA: Diagnosis present

## 2020-01-13 DIAGNOSIS — I25110 Atherosclerotic heart disease of native coronary artery with unstable angina pectoris: Secondary | ICD-10-CM | POA: Diagnosis present

## 2020-01-13 DIAGNOSIS — I200 Unstable angina: Secondary | ICD-10-CM | POA: Diagnosis not present

## 2020-01-13 DIAGNOSIS — Z720 Tobacco use: Secondary | ICD-10-CM

## 2020-01-13 DIAGNOSIS — I201 Angina pectoris with documented spasm: Secondary | ICD-10-CM | POA: Diagnosis not present

## 2020-01-13 DIAGNOSIS — E119 Type 2 diabetes mellitus without complications: Secondary | ICD-10-CM | POA: Diagnosis present

## 2020-01-13 DIAGNOSIS — I214 Non-ST elevation (NSTEMI) myocardial infarction: Principal | ICD-10-CM | POA: Diagnosis present

## 2020-01-13 DIAGNOSIS — I110 Hypertensive heart disease with heart failure: Secondary | ICD-10-CM | POA: Diagnosis present

## 2020-01-13 DIAGNOSIS — Z20822 Contact with and (suspected) exposure to covid-19: Secondary | ICD-10-CM | POA: Diagnosis not present

## 2020-01-13 DIAGNOSIS — I2510 Atherosclerotic heart disease of native coronary artery without angina pectoris: Secondary | ICD-10-CM | POA: Diagnosis present

## 2020-01-13 DIAGNOSIS — Z833 Family history of diabetes mellitus: Secondary | ICD-10-CM

## 2020-01-13 DIAGNOSIS — E1169 Type 2 diabetes mellitus with other specified complication: Secondary | ICD-10-CM | POA: Diagnosis not present

## 2020-01-13 DIAGNOSIS — I5032 Chronic diastolic (congestive) heart failure: Secondary | ICD-10-CM | POA: Diagnosis present

## 2020-01-13 DIAGNOSIS — I252 Old myocardial infarction: Secondary | ICD-10-CM | POA: Diagnosis not present

## 2020-01-13 DIAGNOSIS — E669 Obesity, unspecified: Secondary | ICD-10-CM | POA: Diagnosis not present

## 2020-01-13 DIAGNOSIS — I10 Essential (primary) hypertension: Secondary | ICD-10-CM | POA: Diagnosis not present

## 2020-01-13 DIAGNOSIS — Z825 Family history of asthma and other chronic lower respiratory diseases: Secondary | ICD-10-CM | POA: Diagnosis not present

## 2020-01-13 DIAGNOSIS — I152 Hypertension secondary to endocrine disorders: Secondary | ICD-10-CM | POA: Diagnosis present

## 2020-01-13 DIAGNOSIS — I5033 Acute on chronic diastolic (congestive) heart failure: Secondary | ICD-10-CM | POA: Diagnosis not present

## 2020-01-13 DIAGNOSIS — Z8249 Family history of ischemic heart disease and other diseases of the circulatory system: Secondary | ICD-10-CM

## 2020-01-13 DIAGNOSIS — Z79899 Other long term (current) drug therapy: Secondary | ICD-10-CM | POA: Diagnosis not present

## 2020-01-13 DIAGNOSIS — F17210 Nicotine dependence, cigarettes, uncomplicated: Secondary | ICD-10-CM | POA: Diagnosis present

## 2020-01-13 DIAGNOSIS — G4489 Other headache syndrome: Secondary | ICD-10-CM | POA: Diagnosis not present

## 2020-01-13 DIAGNOSIS — Z83438 Family history of other disorder of lipoprotein metabolism and other lipidemia: Secondary | ICD-10-CM

## 2020-01-13 DIAGNOSIS — Z716 Tobacco abuse counseling: Secondary | ICD-10-CM | POA: Diagnosis not present

## 2020-01-13 DIAGNOSIS — Z882 Allergy status to sulfonamides status: Secondary | ICD-10-CM

## 2020-01-13 DIAGNOSIS — Z794 Long term (current) use of insulin: Secondary | ICD-10-CM | POA: Diagnosis not present

## 2020-01-13 DIAGNOSIS — Z823 Family history of stroke: Secondary | ICD-10-CM | POA: Diagnosis not present

## 2020-01-13 DIAGNOSIS — E785 Hyperlipidemia, unspecified: Secondary | ICD-10-CM | POA: Diagnosis present

## 2020-01-13 DIAGNOSIS — Z955 Presence of coronary angioplasty implant and graft: Secondary | ICD-10-CM

## 2020-01-13 DIAGNOSIS — Z91040 Latex allergy status: Secondary | ICD-10-CM

## 2020-01-13 DIAGNOSIS — Z7985 Type 2 diabetes mellitus without complications: Secondary | ICD-10-CM

## 2020-01-13 DIAGNOSIS — Z79890 Hormone replacement therapy (postmenopausal): Secondary | ICD-10-CM

## 2020-01-13 DIAGNOSIS — R079 Chest pain, unspecified: Secondary | ICD-10-CM | POA: Diagnosis not present

## 2020-01-13 DIAGNOSIS — Z6841 Body Mass Index (BMI) 40.0 and over, adult: Secondary | ICD-10-CM | POA: Diagnosis not present

## 2020-01-13 DIAGNOSIS — E1159 Type 2 diabetes mellitus with other circulatory complications: Secondary | ICD-10-CM | POA: Diagnosis present

## 2020-01-13 DIAGNOSIS — R0789 Other chest pain: Secondary | ICD-10-CM | POA: Diagnosis not present

## 2020-01-13 HISTORY — DX: Non-ST elevation (NSTEMI) myocardial infarction: I21.4

## 2020-01-13 LAB — HEMOGLOBIN A1C
Hgb A1c MFr Bld: 6.1 % — ABNORMAL HIGH (ref 4.8–5.6)
Mean Plasma Glucose: 128.37 mg/dL

## 2020-01-13 LAB — CBC
HCT: 39.2 % (ref 36.0–46.0)
Hemoglobin: 13.2 g/dL (ref 12.0–15.0)
MCH: 31.6 pg (ref 26.0–34.0)
MCHC: 33.7 g/dL (ref 30.0–36.0)
MCV: 93.8 fL (ref 80.0–100.0)
Platelets: 298 10*3/uL (ref 150–400)
RBC: 4.18 MIL/uL (ref 3.87–5.11)
RDW: 12.4 % (ref 11.5–15.5)
WBC: 12.9 10*3/uL — ABNORMAL HIGH (ref 4.0–10.5)
nRBC: 0 % (ref 0.0–0.2)

## 2020-01-13 LAB — HIV ANTIBODY (ROUTINE TESTING W REFLEX): HIV Screen 4th Generation wRfx: NONREACTIVE

## 2020-01-13 LAB — SARS CORONAVIRUS 2 (TAT 6-24 HRS): SARS Coronavirus 2: NEGATIVE

## 2020-01-13 LAB — BASIC METABOLIC PANEL
Anion gap: 13 (ref 5–15)
BUN: 8 mg/dL (ref 6–20)
CO2: 20 mmol/L — ABNORMAL LOW (ref 22–32)
Calcium: 9.3 mg/dL (ref 8.9–10.3)
Chloride: 104 mmol/L (ref 98–111)
Creatinine, Ser: 0.72 mg/dL (ref 0.44–1.00)
GFR calc Af Amer: >60 mL/min (ref >60)
GFR calc non Af Amer: >60 mL/min (ref >60)
Glucose, Bld: 98 mg/dL (ref 70–99)
Potassium: 3.8 mmol/L (ref 3.5–5.1)
Sodium: 137 mmol/L (ref 135–145)

## 2020-01-13 LAB — GLUCOSE, CAPILLARY
Glucose-Capillary: 82 mg/dL (ref 70–99)
Glucose-Capillary: 135 mg/dL — ABNORMAL HIGH (ref 70–99)
Glucose-Capillary: 98 mg/dL (ref 70–99)

## 2020-01-13 LAB — MAGNESIUM: Magnesium: 1.6 mg/dL — ABNORMAL LOW (ref 1.7–2.4)

## 2020-01-13 LAB — TROPONIN I (HIGH SENSITIVITY)
Troponin I (High Sensitivity): 214 ng/L (ref <18)
Troponin I (High Sensitivity): 320 ng/L (ref <18)

## 2020-01-13 LAB — I-STAT BETA HCG BLOOD, ED (MC, WL, AP ONLY): I-stat hCG, quantitative: <5 m[IU]/mL (ref <5)

## 2020-01-13 LAB — HEPARIN LEVEL (UNFRACTIONATED): Heparin Unfractionated: <0.1 IU/mL — ABNORMAL LOW (ref 0.30–0.70)

## 2020-01-13 MED ORDER — SODIUM CHLORIDE 0.9% FLUSH
3.0000 mL | Freq: Two times a day (BID) | INTRAVENOUS | Status: DC
  Administered 2020-01-14: 08:00:00 3 mL via INTRAVENOUS

## 2020-01-13 MED ORDER — INSULIN ASPART 100 UNIT/ML ~~LOC~~ SOLN: 0.0000 [IU] | Freq: Every day | SUBCUTANEOUS | Status: DC

## 2020-01-13 MED ORDER — SODIUM CHLORIDE 0.9 % WEIGHT BASED INFUSION: 1.0000 mL/kg/h | INTRAVENOUS | Status: DC

## 2020-01-13 MED ORDER — ALPRAZOLAM 0.25 MG PO TABS: 0.2500 mg | ORAL_TABLET | Freq: Two times a day (BID) | ORAL | Status: DC | PRN

## 2020-01-13 MED ORDER — FLUOXETINE HCL 20 MG PO CAPS
40.0000 mg | ORAL_CAPSULE | Freq: Every day | ORAL | Status: DC
  Administered 2020-01-13 – 2020-01-15 (×3): 40 mg via ORAL
  Filled 2020-01-13 (×3): qty 2

## 2020-01-13 MED ORDER — ASPIRIN 81 MG PO CHEW
324.0000 mg | CHEWABLE_TABLET | ORAL | Status: AC
  Administered 2020-01-13: 13:00:00 324 mg via ORAL
  Filled 2020-01-13: qty 4

## 2020-01-13 MED ORDER — SODIUM CHLORIDE 0.9% FLUSH: 3.0000 mL | INTRAVENOUS | Status: DC | PRN

## 2020-01-13 MED ORDER — FENOFIBRATE 54 MG PO TABS
54.0000 mg | ORAL_TABLET | Freq: Every day | ORAL | Status: DC
  Administered 2020-01-13 – 2020-01-15 (×3): 54 mg via ORAL
  Filled 2020-01-13 (×3): qty 1

## 2020-01-13 MED ORDER — RANOLAZINE ER 500 MG PO TB12
1000.0000 mg | ORAL_TABLET | Freq: Two times a day (BID) | ORAL | Status: DC
  Administered 2020-01-13 – 2020-01-15 (×5): 1000 mg via ORAL
  Filled 2020-01-13 (×6): qty 2

## 2020-01-13 MED ORDER — ZOLPIDEM TARTRATE 5 MG PO TABS: 5.0000 mg | ORAL_TABLET | Freq: Every evening | ORAL | Status: DC | PRN

## 2020-01-13 MED ORDER — ASPIRIN 81 MG PO CHEW: 81.0000 mg | CHEWABLE_TABLET | Freq: Every day | ORAL | Status: DC

## 2020-01-13 MED ORDER — CARVEDILOL 25 MG PO TABS
25.0000 mg | ORAL_TABLET | Freq: Two times a day (BID) | ORAL | Status: DC
  Administered 2020-01-13 – 2020-01-15 (×4): 25 mg via ORAL
  Filled 2020-01-13 (×4): qty 1

## 2020-01-13 MED ORDER — INSULIN ASPART 100 UNIT/ML ~~LOC~~ SOLN
0.0000 [IU] | Freq: Three times a day (TID) | SUBCUTANEOUS | Status: DC
  Administered 2020-01-13: 17:00:00 3 [IU] via SUBCUTANEOUS
  Administered 2020-01-15: 12:00:00 7 [IU] via SUBCUTANEOUS

## 2020-01-13 MED ORDER — TORSEMIDE 20 MG PO TABS: 40.0000 mg | ORAL_TABLET | Freq: Every day | ORAL | Status: DC | PRN

## 2020-01-13 MED ORDER — HEPARIN (PORCINE) 25000 UT/250ML-% IV SOLN
1950.0000 [IU]/h | INTRAVENOUS | Status: DC
  Administered 2020-01-13: 13:00:00 1350 [IU]/h via INTRAVENOUS
  Administered 2020-01-14: 1750 [IU]/h via INTRAVENOUS
  Filled 2020-01-13 (×2): qty 250

## 2020-01-13 MED ORDER — ASPIRIN 81 MG PO CHEW
81.0000 mg | CHEWABLE_TABLET | ORAL | Status: AC
  Administered 2020-01-14: 81 mg via ORAL
  Filled 2020-01-13: qty 1

## 2020-01-13 MED ORDER — ONDANSETRON HCL 4 MG/2ML IJ SOLN: 4.0000 mg | Freq: Four times a day (QID) | INTRAMUSCULAR | Status: DC | PRN

## 2020-01-13 MED ORDER — LOSARTAN POTASSIUM 25 MG PO TABS
12.5000 mg | ORAL_TABLET | Freq: Every day | ORAL | Status: DC
  Administered 2020-01-13 – 2020-01-15 (×3): 12.5 mg via ORAL
  Filled 2020-01-13: qty 0.5
  Filled 2020-01-13 (×2): qty 1

## 2020-01-13 MED ORDER — SODIUM CHLORIDE 0.9 % IV SOLN: 250.0000 mL | INTRAVENOUS | Status: DC | PRN

## 2020-01-13 MED ORDER — SODIUM CHLORIDE 0.9 % WEIGHT BASED INFUSION
1.0000 mL/kg/h | INTRAVENOUS | Status: DC
  Administered 2020-01-14: 07:00:00 1 mL/kg/h via INTRAVENOUS

## 2020-01-13 MED ORDER — HEPARIN BOLUS VIA INFUSION
4000.0000 [IU] | Freq: Once | INTRAVENOUS | Status: AC
  Administered 2020-01-13: 22:00:00 4000 [IU] via INTRAVENOUS
  Filled 2020-01-13: qty 4000

## 2020-01-13 MED ORDER — PREGABALIN 100 MG PO CAPS
100.0000 mg | ORAL_CAPSULE | Freq: Two times a day (BID) | ORAL | Status: DC
  Administered 2020-01-13 – 2020-01-15 (×5): 100 mg via ORAL
  Filled 2020-01-13 (×5): qty 1

## 2020-01-13 MED ORDER — POTASSIUM CHLORIDE CRYS ER 20 MEQ PO TBCR
20.0000 meq | EXTENDED_RELEASE_TABLET | ORAL | Status: DC
  Administered 2020-01-13 – 2020-01-15 (×2): 20 meq via ORAL
  Filled 2020-01-13 (×3): qty 1

## 2020-01-13 MED ORDER — NITROGLYCERIN 0.4 MG SL SUBL: 0.4000 mg | SUBLINGUAL_TABLET | SUBLINGUAL | Status: DC | PRN

## 2020-01-13 MED ORDER — NITROGLYCERIN IN D5W 200-5 MCG/ML-% IV SOLN
0.0000 ug/min | INTRAVENOUS | Status: DC
  Administered 2020-01-13: 17:00:00 10 ug/min via INTRAVENOUS
  Administered 2020-01-13: 15:00:00 5 ug/min via INTRAVENOUS
  Administered 2020-01-13: 15 ug/min via INTRAVENOUS
  Filled 2020-01-13: qty 250

## 2020-01-13 MED ORDER — SODIUM CHLORIDE 0.9% FLUSH
3.0000 mL | Freq: Two times a day (BID) | INTRAVENOUS | Status: DC
  Administered 2020-01-13 – 2020-01-14 (×3): 3 mL via INTRAVENOUS

## 2020-01-13 MED ORDER — ASPIRIN 81 MG PO CHEW: 81.0000 mg | CHEWABLE_TABLET | ORAL | Status: AC

## 2020-01-13 MED ORDER — ROSUVASTATIN CALCIUM 20 MG PO TABS
40.0000 mg | ORAL_TABLET | Freq: Every day | ORAL | Status: DC
  Administered 2020-01-13 – 2020-01-15 (×3): 40 mg via ORAL
  Filled 2020-01-13: qty 2
  Filled 2020-01-13: qty 8
  Filled 2020-01-13: qty 2

## 2020-01-13 MED ORDER — ACETAMINOPHEN 325 MG PO TABS
650.0000 mg | ORAL_TABLET | ORAL | Status: DC | PRN
  Administered 2020-01-13 – 2020-01-14 (×2): 650 mg via ORAL
  Filled 2020-01-13 (×2): qty 2

## 2020-01-13 MED ORDER — SODIUM CHLORIDE 0.9% FLUSH
3.0000 mL | Freq: Once | INTRAVENOUS | Status: AC
  Administered 2020-01-13: 09:00:00 3 mL via INTRAVENOUS

## 2020-01-13 MED ORDER — ASPIRIN EC 81 MG PO TBEC: 81.0000 mg | DELAYED_RELEASE_TABLET | Freq: Every day | ORAL | Status: DC

## 2020-01-13 MED ORDER — ASPIRIN 300 MG RE SUPP: 300.0000 mg | RECTAL | Status: AC

## 2020-01-13 MED ORDER — SODIUM CHLORIDE 0.9 % WEIGHT BASED INFUSION: 3.0000 mL/kg/h | INTRAVENOUS | Status: DC

## 2020-01-13 MED ORDER — ASPIRIN EC 81 MG PO TBEC
81.0000 mg | DELAYED_RELEASE_TABLET | Freq: Every day | ORAL | Status: DC
  Administered 2020-01-15: 09:00:00 81 mg via ORAL
  Filled 2020-01-13: qty 1

## 2020-01-13 MED ORDER — SODIUM CHLORIDE 0.9 % WEIGHT BASED INFUSION
3.0000 mL/kg/h | INTRAVENOUS | Status: DC
  Administered 2020-01-14: 3 mL/kg/h via INTRAVENOUS

## 2020-01-13 MED ORDER — HEPARIN BOLUS VIA INFUSION
4000.0000 [IU] | Freq: Once | INTRAVENOUS | Status: AC
  Administered 2020-01-13: 13:00:00 4000 [IU] via INTRAVENOUS
  Filled 2020-01-13: qty 4000

## 2020-01-13 NOTE — H&P (Addendum)
Cardiology Admission History and Physical:   Patient ID: [name] MRN: [id number]; DOB: 24-Dec-1980   Admission date: 01/13/2020  Primary Care Provider: Janora Norlander, DO Primary Cardiologist: Carlyle Dolly, MD  Primary Electrophysiologist:  None   Chief Complaint:  Chest pain  Patient Profile:   [name] is a 39 y.o. female with pmh of CAD (s/p BMA to LAD with angioplasty of RI in 2013, patent stent by repeat caths in 02/2012, 12/2013, and 02/2016, cath in 07/2018 showing patent mid-LAD stent with 99% D1 stenosis, 50% RCA, and 35% RI stenosis with medical management recommended (as D1 was small in caliber), chronic diastolic CHF, HTN, HLD, DM2, morbid obesity,  tobacco use who is being seen for chest pain.   History of Present Illness:   [name] is followed by Dr. Harl Bowie. Patient last echo was in 2017 which showed EF 50%, WMA and moderately dilated RV. Following the patient's last cath (07/2018) the patient was started on Ranexa. She was last seen 08/29/19 and reported occasional shooting chest pains last seconds, had not used NTG. She has Torsemide 40 mg to take as needed. Previously intolerant to Imdur. She remained on Aspirin, Coreg, Losartan, Ranexa, and statin. She follows with Dr. Debara Pickett at the lipid clinic, last seen 12/27/19.  The patient presented to the ED 01/13/20 for chest pain. CP started about a week ago and has been intermittent. It is in the middle of the chest and radiates to the right shoulder and down the arm. She has associated sob and diaphoresis. CP similar to prior ACS. The patient spoke with Dr. Harl Bowie about 5 days ago who recommended increasing Ranexa, this seemed to only minimally help. Last night patient had another episode of chest pain radiating into her right arm with associated sob and diaphoresis. She took NTG which minimally helped the pain. Pain lasted about 30 minutes. EMS was called who administered another NTG and Aspirin.   In the ED BP  123/57, pulse 74, afebrile, RR 20, 99% O2. Labs show potassium 3.8, glucose 98, creatinine 0.72. WBC 12.9, Hgb 13.2. HS trop 214>320. Hcg negative. CXR unremarkable. EKG with NSR and no ischemic changes. Cardiology was consulted for possible admission.   On my interview patient [name] has some chest tightness. She denies alcohol and drug use. She smokes about 1/2 ppd. She takes care of her father-in-law. Denies recent fevers, chills, diarrhea, abd pain, headaches, congestion. Says white count is chronically up. Denies recent lower leg swelling. She takes Torsemide every other day.    Past Medical History:  Diagnosis Date  . Allergy   . Anemia   . Anxiety   . CHF (congestive heart failure) ([place])   . Chronic kidney disease    history of kidney stones  . Coronary artery disease 09/2011   a.s/p BMS to LAD and angioplasty of RI in 09/2011 b. patent stent by cath in 02/2012, 12/2013, and 02/2016 with most recent showing 30-40% dLAD and 30-40% RCA stenosis  . Depression   . Diabetes mellitus   . Hyperlipidemia   . Hypertension   . Midsternal chest pain 03/03/2012  . Migraines   . Morbid obesity ([place])   . Myocardial infarct ([place]) 09/2011  . Myocardial infarction ([place]) 02/2016  . Palpitations 02/17/2012  . Sleep apnea   . Ureteral stone with hydronephrosis   . Urinary tract obstruction due to kidney stone 02/15/2017    Past Surgical History:  Procedure Laterality Date  . CARDIAC CATHETERIZATION  10/08/2011  LAD: 95% mid, Ramus: 95% ostial  . CARPAL TUNNEL RELEASE    . CESAREAN SECTION    . CHOLECYSTECTOMY    . CORONARY ANGIOPLASTY WITH STENT PLACEMENT  10/08/2011   LAD: BMS, Ramus: cutting balloon angioplasty  . CYSTOSCOPY WITH RETROGRADE PYELOGRAM, URETEROSCOPY AND STENT PLACEMENT Left 02/16/2017   Procedure: CYSTOSCOPY WITH RETROGRADE PYELOGRAM, URETEROSCOPY AND STENT PLACEMENT;  Surgeon: Kathie Rhodes, MD;  Location: [facility];  Service: Urology;  Laterality: Left;  . CYSTOSCOPY WITH  RETROGRADE PYELOGRAM, URETEROSCOPY AND STENT PLACEMENT Left 03/28/2017   Procedure: LEFT STENT REMOVAL LEFT RETROGRADE PYELOGRAM LEFT URETEROSCOPY   LASER LITHOTRIPSY  AND  STENT REPLACEMENT;  Surgeon: Cleon Gustin, MD;  Location: [facility];  Service: Urology;  Laterality: Left;  . LEFT HEART CATH AND CORONARY ANGIOGRAPHY N/A 08/15/2018   Procedure: LEFT HEART CATH AND CORONARY ANGIOGRAPHY;  Surgeon: Martinique, Peter M, MD;  Location: [place] [facility];  Service: Cardiovascular;  Laterality: N/A;  . LEFT HEART CATHETERIZATION WITH CORONARY ANGIOGRAM N/A 10/08/2011   Procedure: LEFT HEART CATHETERIZATION WITH CORONARY ANGIOGRAM;  Surgeon: Peter M Martinique, MD;  Location: [facility] CATH LAB;  Service: Cardiovascular;  Laterality: N/A;  . LEFT HEART CATHETERIZATION WITH CORONARY ANGIOGRAM N/A 03/02/2012   Procedure: LEFT HEART CATHETERIZATION WITH CORONARY ANGIOGRAM;  Surgeon: Burnell Blanks, MD;  Location: [facility] CATH LAB;  Service: Cardiovascular;  Laterality: N/A;  . LEFT HEART CATHETERIZATION WITH CORONARY ANGIOGRAM N/A 01/04/2014   Procedure: LEFT HEART CATHETERIZATION WITH CORONARY ANGIOGRAM;  Surgeon: Troy Sine, MD;  Location: [facility] CATH LAB;  Service: Cardiovascular;  Laterality: N/A;     Medications Prior to Admission: Prior to Admission medications   Medication Sig Start Date End Date Taking? Authorizing Provider  aspirin 81 MG chewable tablet Chew 81 mg by mouth daily.   Yes [provider]  carvedilol (COREG) 25 MG tablet TAKE 1 TABLET BY MOUTH TWICE DAILY WITH A MEAL Patient taking differently: Take 25 mg by mouth 2 (two) times daily with a meal. TAKE 1 TABLET BY MOUTH TWICE DAILY WITH A MEAL 10/02/19  Yes Branch, Alphonse Guild, MD  fenofibrate (TRICOR) 48 MG tablet TAKE 1 TABLET BY MOUTH EVERY DAY Patient taking differently: Take 48 mg by mouth daily.  01/02/20  Yes Gottschalk, Ashly M, DO  FLUoxetine (PROZAC) 40 MG capsule TAKE 1 CAPSULE BY MOUTH EVERY DAY Patient taking differently: Take  40 mg by mouth daily.  12/27/19  Yes Gottschalk, Leatrice Jewels M, DO  ibuprofen (ADVIL) 200 MG tablet Take 400 mg by mouth every 6 (six) hours as needed for fever or headache.   Yes [provider]  icosapent Ethyl (VASCEPA) 1 g capsule Take 2 capsules (2 g total) by mouth 2 (two) times daily. 01/07/20  Yes Hilty, Nadean Corwin, MD  losartan (COZAAR) 25 MG tablet TAKE 1/2 TABLET BY MOUTH EVERY DAY Patient taking differently: Take 12.5 mg by mouth daily.  10/02/19  Yes Branch, Alphonse Guild, MD  medroxyPROGESTERone (PROVERA) 5 MG tablet TAKE 1 TABLET BY MOUTH DAILY Patient taking differently: Take 5 mg by mouth daily.  01/02/20  Yes Gottschalk, Ashly M, DO  metFORMIN (GLUCOPHAGE) 1000 MG tablet TAKE 1 TABLET BY MOUTH TWICE DAILY Patient taking differently: Take 1,000 mg by mouth 2 (two) times daily with a meal.  12/05/19  Yes Gottschalk, Ashly M, DO  nitroGLYCERIN (NITROSTAT) 0.4 MG SL tablet DISSOLVE 1 TABLET UNDER THE TONGUE EVERY 5 MINUTES AS NEEDED FOR CHEST PAIN. DO NOT EXCEED A TOTAL OF 3 DOSES  IN 15 MINUTES. Patient taking differently: Place 0.4 mg under the tongue every 5 (five) minutes as needed for chest pain.  10/09/19  Yes Branch, Alphonse Guild, MD  potassium chloride SA (KLOR-CON) 20 MEQ tablet TAKE 1 TABLET BY MOUTH EVERY DAY Patient taking differently: Take 20 mEq by mouth every other day.  10/02/19  Yes Branch, Alphonse Guild, MD  pregabalin (LYRICA) 100 MG capsule TAKE ONE CAPSULE BY MOUTH TWICE DAILY Patient taking differently: Take 100 mg by mouth 2 (two) times daily.  12/09/19  Yes Ronnie Doss M, DO  ranolazine (RANEXA) 1000 MG SR tablet Take 1 tablet (1,000 mg total) by mouth 2 (two) times daily. 01/09/20  Yes Branch, Alphonse Guild, MD  rosuvastatin (CRESTOR) 40 MG tablet TAKE 1 TABLET BY MOUTH EVERY DAY Patient taking differently: TAKE 1 TABLET BY MOUTH EVERY DAY 10/29/19  Yes Branch, Alphonse Guild, MD  torsemide (DEMADEX) 20 MG tablet Take 40 mg by mouth daily as needed (swelling).    Yes [provider]  VICTOZA 18 MG/3ML SOPN INJECT 1.2 MG INTO SKIN EVERY DAY Patient taking differently: Inject 1.2 mg into the skin daily.  01/02/20  Yes Gottschalk, Leatrice Jewels M, DO  ACCU-CHEK AVIVA PLUS test strip USE TO TEST BLOOD SUGAR FOUR TIMES DAILY 08/06/19   Ronnie Doss M, DO  blood glucose meter kit and supplies Dispense based on patient and insurance preference. Use up to four times daily as directed. (FOR ICD-10:  E11.22). Check BG daily 06/26/18   Ronnie Doss M, DO  Insulin Pen Needle (GLOBAL EASE INJECT PEN NEEDLES) 31G X 5 MM MISC USE ONCE EVERY DAY Dx E11.9 10/02/19   Janora Norlander, DO     Allergies:    Allergies  Allergen Reactions  . Bee Venom Anaphylaxis and Swelling  . Latex Itching  . Sulfa Drugs Cross Reactors Nausea And Vomiting    Social History:   Social History   Socioeconomic History  . Marital status: Married    Spouse name: Barnabas Lister  . Number of children: 2  . Years of education: 10  . Highest education level: Not on file  Occupational History  . Not on file  Tobacco Use  . Smoking status: Current Every Day Smoker    Packs/day: 0.50    Years: 20.00    Pack years: 10.00    Types: Cigarettes    Start date: 03/18/1990  . Smokeless tobacco: Never Used  Substance and Sexual Activity  . Alcohol use: Yes    Alcohol/week: 0.0 standard drinks    Comment: maybe once a year  . Drug use: No  . Sexual activity: Yes    Birth control/protection: None    Comment: PCOS  Other Topics Concern  . Not on file  Social History Narrative   Unemployed   Applied for disability   Leisure "take care of house and kids"   Walks when able   Social Determinants of Health   Financial Resource Strain:   . Difficulty of Paying Living Expenses:   Food Insecurity:   . Worried About Charity fundraiser in the Last Year:   . Arboriculturist in the Last Year:   Transportation Needs:   . Film/video editor (Medical):   Marland Kitchen Lack of Transportation (Non-Medical):     Physical Activity:   . Days of Exercise per Week:   . Minutes of Exercise per Session:   Stress:   . Feeling of Stress :   Social Connections:   .  Frequency of Communication with Friends and Family:   . Frequency of Social Gatherings with Friends and Family:   . Attends Religious Services:   . Active Member of Clubs or Organizations:   . Attends Archivist Meetings:   Marland Kitchen Marital Status:   Intimate Partner Violence:   . Fear of Current or Ex-Partner:   . Emotionally Abused:   Marland Kitchen Physically Abused:   . Sexually Abused:     Family History:   The patient's family history includes Appendicitis in her son; Asthma in her son; Cancer in her father, maternal grandmother, mother, and paternal grandfather; Diabetes in her father and mother; Heart murmur in her father; Hernia in her son; Hyperlipidemia in her father; Hypertension in her father; Mental illness in her son; Stroke in her maternal grandmother.    ROS:  Please see the history of present illness.  All other ROS reviewed and negative.     Physical Exam/Data:   Vitals:   01/13/20 1045 01/13/20 1100 01/13/20 1115 01/13/20 1130  BP: 134/89 139/90 130/76 135/78  Pulse: 74 77 76 72  Resp: 20 (!) 22 (!) 22 18  Temp:      TempSrc:      SpO2: 96% 92% 96% 96%  Weight:      Height:       No intake or output data in the 24 hours ending 01/13/20 1206 Last 3 Weights 01/13/2020 10/09/2019 08/29/2019  Weight (lbs) 375 lb 392 lb 394 lb  Weight (kg) 170.099 kg 177.81 kg 178.717 kg     Body mass index is 60.53 kg/m.  General:  Well nourished, well developed, in no acute distress HEENT: normal Lymph: no adenopathy Neck: no JVD Endocrine:  No thryomegaly Vascular: No carotid bruits; FA pulses 2+ bilaterally without bruits  Cardiac:  normal S1, S2; RRR; no murmur  Lungs:  clear to auscultation bilaterally, minimal wheezing,no rhonchi or rales  Abd: soft, nontender, no hepatomegaly  Ext: no edema Musculoskeletal:  No  deformities, BUE and BLE strength normal and equal Skin: warm and dry  Neuro:  CNs 2-12 intact, no focal abnormalities noted Psych:  Normal affect    EKG:  The ECG that was done 01/13/20 was personally reviewed and demonstrates NSR, 80 bpm, TWI aVL (seen on previous EKGs)  Relevant CV Studies:  Cardiac cath 07/2018  Prox LAD lesion is 30% stenosed.  Ost 1st Diag to 1st Diag lesion is 99% stenosed.  Ost Ramus to Ramus lesion is 35% stenosed.  Prox RCA to Dist RCA lesion is 50% stenosed.  There is mild left ventricular systolic dysfunction.  LV end diastolic pressure is normal.  The left ventricular ejection fraction is 45-50% by visual estimate.   1. Single vessel obstructive CAD    - 99% ostial first diagonal. This vessel appears small in caliber.    - Patent stent in the mid LAD    - 35% ramus intermediate    - the RCA is a small nondominant vessel with diffuse 50% stenosis 2. Mild LV dysfunction with anterolateral HK. EF estimated at 45-50% 3. Normal LVEDP  Plan: recommend medical management. The diagonal appears too small for PCI.  Recommend Aspirin 6m daily for moderate CAD.\  Echo 2017 suboptimal images, Low normal left ventricular systolic function, EF 559% with WMA, dilated RV   Laboratory Data:  High Sensitivity Troponin:   Recent Labs  Lab 01/13/20 0517 01/13/20 0842  TROPONINIHS 214* 320*      Chemistry Recent Labs  Lab  01/13/20 0517  NA 137  K 3.8  CL 104  CO2 20*  GLUCOSE 98  BUN 8  CREATININE 0.72  CALCIUM 9.3  GFRNONAA >60  GFRAA >60  ANIONGAP 13    No results for input(s): PROT, ALBUMIN, AST, ALT, ALKPHOS, BILITOT in the last 168 hours. Hematology Recent Labs  Lab 01/13/20 0517  WBC 12.9*  RBC 4.18  HGB 13.2  HCT 39.2  MCV 93.8  MCH 31.6  MCHC 33.7  RDW 12.4  PLT 298   BNPNo results for input(s): BNP, PROBNP in the last 168 hours.  DDimer No results for input(s): DDIMER in the last 168 hours.   Radiology/Studies:   DG Chest 2 View  Result Date: 01/13/2020 CLINICAL DATA:  Chest pain EXAM: CHEST - 2 VIEW COMPARISON:  None. FINDINGS: The heart size and mediastinal contours are within normal limits. Both lungs are clear. The visualized skeletal structures are unremarkable. IMPRESSION: No active cardiopulmonary disease. Electronically Signed   By: Ulyses Jarred M.D.   On: 01/13/2020 05:45    TIMI Risk Score for Unstable Angina or Non-ST Elevation MI:   The patient's TIMI risk score is 4, which indicates a 20% risk of all cause mortality, new or recurrent myocardial infarction or need for urgent revascularization in the next 14 days.   Assessment and Plan:   NSTEMI - presents with chest pain similar to prior ACS. HS trop 214>320. EKG with no changes - Admit for observation and possible cath - Last cath was 2019 which showed smaller vessel disease off the LAD which was not amenable to PCI - repeat echo. Echo in 2017 with EF 50% - Start IV heparin - Nitro gtt for chest tightness - continue aspirin, ranexa, statin - NPO for cath tomorrow. Creatinine stable Risks and benefits of cardiac catheterization have been discussed with the patient.  These include bleeding, infection, kidney damage, stroke, heart attack, death.  The patient understands these risks and is willing to proceed.  Leukocytosis - WBC 12.9 - check covid - No fever, chill, recent illness  HLD - Crestor '40mg'$  and fenofibrate 48 - 3 months ago LDL 66 - will recheck while in the hospital - follows with Dr. Debara Pickett in the lipid clinic  HTN - coreg 25 mg BID and Losartan 12.5 mg daily - pressures stable  Chronic diastolic HF - torsemide 40 mg every other day - euvolemic on exam - recheck echo  DM2 - SSI   Severity of Illness: The appropriate patient status for this patient is INPATIENT. Inpatient status is judged to be reasonable and necessary in order to provide the required intensity of service to ensure the patient's safety. The  patient's presenting symptoms, physical exam findings, and initial radiographic and laboratory data in the context of their chronic comorbidities is felt to place them at high risk for further clinical deterioration. Furthermore, it is not anticipated that the patient will be medically stable for discharge from the hospital within 2 midnights of admission. The following factors support the patient status of inpatient.   " The patient's presenting symptoms include chest pain. " The worrisome physical exam findings include chest pain. " The initial radiographic and laboratory data are worrisome because of elevated troponin. " The chronic co-morbidities include HTN, HLD, DM2.   * I certify that at the point of admission it is my clinical judgment that the patient will require inpatient hospital care spanning beyond 2 midnights from the point of admission due to high intensity of service,  high risk for further deterioration and high frequency of surveillance required.*    For questions or updates, please contact [place] Please consult [email or web address] for contact info under        Signed, Lajuane Leatham Ninfa Meeker, PA-C  01/13/2020 12:06 PM

## 2020-01-13 NOTE — ED Triage Notes (Signed)
BIB EMS from home. Pt reports onset of chest pain PTA. Given 1NTG, 324 ASA en route. A/OX4.

## 2020-01-13 NOTE — Progress Notes (Signed)
ANTICOAGULATION CONSULT NOTE   Pharmacy Consult for heparin Indication: chest pain/ACS  Allergies  Allergen Reactions  . Bee Venom Anaphylaxis and Swelling  . Latex Itching  . Sulfa Drugs Cross Reactors Nausea And Vomiting    Patient Measurements: Height: 5\' 9"  (175.3 cm) Weight: (!) 180.1 kg (397 lb 1.6 oz)(scale A) IBW/kg (Calculated) : 66.2 Heparin Dosing Weight: 103kg  Vital Signs: Temp: 98.1 F (36.7 C) (04/18 1948) Temp Source: Oral (04/18 1948) BP: 116/80 (04/18 1948) Pulse Rate: 92 (04/18 1948)  Labs: Recent Labs    01/13/20 0517 01/13/20 0842 01/13/20 1827  HGB 13.2  --   --   HCT 39.2  --   --   PLT 298  --   --   HEPARINUNFRC  --   --  <0.10*  CREATININE 0.72  --   --   TROPONINIHS 214* 320*  --     Estimated Creatinine Clearance: 165 mL/min (by C-G formula based on SCr of 0.72 mg/dL).   Medical History: Past Medical History:  Diagnosis Date  . Allergy   . Anemia   . Anxiety   . CHF (congestive heart failure) ([facility])   . Chronic kidney disease    history of kidney stones  . Coronary artery disease 09/2011   a.s/p BMS to LAD and angioplasty of RI in 09/2011 b. patent stent by cath in 02/2012, 12/2013, and 02/2016 with most recent showing 30-40% dLAD and 30-40% RCA stenosis  . Depression   . Diabetes mellitus   . Hyperlipidemia   . Hypertension   . Midsternal chest pain 03/03/2012  . Migraines   . Morbid obesity ([facility])   . Myocardial infarct ([facility]) 09/2011  . Myocardial infarction ([facility]) 02/2016  . Palpitations 02/17/2012  . Sleep apnea   . Ureteral stone with hydronephrosis   . Urinary tract obstruction due to kidney stone 02/15/2017   Assessment: 38 YOF presenting with CP intermittent x 7d, hx of CAD, troponin increase.  Plan for cath in AM, not on anticoagulation PTA.  CBC wnl.  Initial heparin level undetectable on 1350 units/hr of heparin. No bleeding or IV issues noted.   Goal of Therapy:  Heparin level 0.3-0.7 units/ml Monitor platelets  by anticoagulation protocol: Yes   Plan:  Rebolus Heparin 4000 units IV x 1, and then increase rate to 1750 units/hr F/u 6 hour heparin level  02/17/2017 PharmD., BCPS Clinical Pharmacist 01/13/2020 8:46 PM

## 2020-01-13 NOTE — Progress Notes (Signed)
ANTICOAGULATION CONSULT NOTE - Initial Consult  Pharmacy Consult for heparin Indication: chest pain/ACS  Allergies  Allergen Reactions  . Bee Venom Anaphylaxis and Swelling  . Latex Itching  . Sulfa Drugs Cross Reactors Nausea And Vomiting    Patient Measurements: Height: 5\' 6"  (167.6 cm) Weight: (!) 170.1 kg (375 lb) IBW/kg (Calculated) : 59.3 Heparin Dosing Weight: 103kg  Vital Signs: Temp: 98.3 F (36.8 C) (04/18 0746) Temp Source: Oral (04/18 0746) BP: 135/78 (04/18 1130) Pulse Rate: 72 (04/18 1130)  Labs: Recent Labs    01/13/20 0517 01/13/20 0842  HGB 13.2  --   HCT 39.2  --   PLT 298  --   CREATININE 0.72  --   TROPONINIHS 214* 320*    Estimated Creatinine Clearance: 152.9 mL/min (by C-G formula based on SCr of 0.72 mg/dL).   Medical History: Past Medical History:  Diagnosis Date  . Allergy   . Anemia   . Anxiety   . CHF (congestive heart failure) ([facility])   . Chronic kidney disease    history of kidney stones  . Coronary artery disease 09/2011   a.s/p BMS to LAD and angioplasty of RI in 09/2011 b. patent stent by cath in 02/2012, 12/2013, and 02/2016 with most recent showing 30-40% dLAD and 30-40% RCA stenosis  . Depression   . Diabetes mellitus   . Hyperlipidemia   . Hypertension   . Midsternal chest pain 03/03/2012  . Migraines   . Morbid obesity ([facility])   . Myocardial infarct ([facility]) 09/2011  . Myocardial infarction ([facility]) 02/2016  . Palpitations 02/17/2012  . Sleep apnea   . Ureteral stone with hydronephrosis   . Urinary tract obstruction due to kidney stone 02/15/2017   Assessment: 38 YOF presenting with CP intermittent x 7d, hx of CAD, troponin increase.  Plan for cath in AM, not on anticoagulation PTA.  CBC wnl.  Goal of Therapy:  Heparin level 0.3-0.7 units/ml Monitor platelets by anticoagulation protocol: Yes   Plan:  Heparin 4000 units IV x 1, and gtt at 1350 units/hr F/u 6 hour heparin level  02/17/2017, PharmD Clinical  Pharmacist ED Pharmacist Phone # [phone] 01/13/2020 12:22 PM

## 2020-01-13 NOTE — ED Provider Notes (Signed)
[facility] EMERGENCY DEPARTMENT Provider Note   CSN: [id number] Arrival date & time: 01/13/20  0459     History Chief Complaint  Patient presents with  . Chest Pain    [name] is a 39 y.o. female with past medical history significant for myocardial infarction, IDDM, CKD, HTN, HLD, and morbid obesity who presents to the ED via EMS for acute onset chest pain.  Patient reports that she had stenting of her LAD in 2013 and then had a STEMI in 2017.  She reports that she has been managed by her cardiologist, Dr. Harl Bowie, and her lipid specialist, Dr. Debara Pickett.  For the past week, she has been experiencing some intermittent episodes of chest pain radiating to her right arm, consistent with her prior episodes of ACS.  She also endorses associated shortness of breath.  She spoke with her cardiologist 01/10/2020 who informed her to go to the ED should she experience any worsening symptoms.  Last evening around 11 PM she was watching a movie with her husband when she developed another episode of chest pain radiating to her right arm.  It lasted approximately 20 minutes duration.  She endorsed associated diaphoresis and shortness of breath symptoms.  After it subsided, she has since developed two additional episodes and she finally decided to call EMS.  She took 1 nitro at home and was given 1 via EMS.  EMS also provide her with chewable aspirin.  She denies any recent illness, fevers or chills, current chest pain or shortness of breath, dizziness, current nausea or vomiting, or other symptoms.   HPI     Past Medical History:  Diagnosis Date  . Allergy   . Anemia   . Anxiety   . CHF (congestive heart failure) ([place])   . Chronic kidney disease    history of kidney stones  . Coronary artery disease 09/2011   a.s/p BMS to LAD and angioplasty of RI in 09/2011 b. patent stent by cath in 02/2012, 12/2013, and 02/2016 with most recent showing 30-40% dLAD and 30-40% RCA stenosis  .  Depression   . Diabetes mellitus   . Hyperlipidemia   . Hypertension   . Midsternal chest pain 03/03/2012  . Migraines   . Morbid obesity ([place])   . Myocardial infarct ([place]) 09/2011  . Myocardial infarction ([place]) 02/2016  . Palpitations 02/17/2012  . Sleep apnea   . Ureteral stone with hydronephrosis   . Urinary tract obstruction due to kidney stone 02/15/2017    Patient Active Problem List   Diagnosis Date Noted  . Angina pectoris ([place]) 08/15/2018  . Chronic diastolic CHF (congestive heart failure) ([place]) 08/15/2018  . Essential hypertension 10/28/2017  . History of chronic CHF 10/28/2017  . PCOS (polycystic ovarian syndrome) 10/28/2017  . Diabetic neuropathy, painful ([place]) 10/28/2017  . Chronic back pain 10/28/2017  . Coronary artery disease   . DM2 (diabetes mellitus, type 2) ([place]) 10/10/2011  . History of acute anterior wall MI 10/09/2011  . Morbid obesity ([place]) 10/09/2011  . Tobacco abuse 10/09/2011  . Hyperlipidemia 10/09/2011    Past Surgical History:  Procedure Laterality Date  . CARDIAC CATHETERIZATION  10/08/2011   LAD: 95% mid, Ramus: 95% ostial  . CARPAL TUNNEL RELEASE    . CESAREAN SECTION    . CHOLECYSTECTOMY    . CORONARY ANGIOPLASTY WITH STENT PLACEMENT  10/08/2011   LAD: BMS, Ramus: cutting balloon angioplasty  . CYSTOSCOPY WITH RETROGRADE PYELOGRAM, URETEROSCOPY AND STENT PLACEMENT Left 02/16/2017  Procedure: CYSTOSCOPY WITH RETROGRADE PYELOGRAM, URETEROSCOPY AND STENT PLACEMENT;  Surgeon: Kathie Rhodes, MD;  Location: [facility];  Service: Urology;  Laterality: Left;  . CYSTOSCOPY WITH RETROGRADE PYELOGRAM, URETEROSCOPY AND STENT PLACEMENT Left 03/28/2017   Procedure: LEFT STENT REMOVAL LEFT RETROGRADE PYELOGRAM LEFT URETEROSCOPY   LASER LITHOTRIPSY  AND  STENT REPLACEMENT;  Surgeon: Cleon Gustin, MD;  Location: [facility];  Service: Urology;  Laterality: Left;  . LEFT HEART CATH AND CORONARY ANGIOGRAPHY N/A 08/15/2018   Procedure: LEFT HEART CATH AND CORONARY  ANGIOGRAPHY;  Surgeon: Martinique, Peter M, MD;  Location: [facility];  Service: Cardiovascular;  Laterality: N/A;  . LEFT HEART CATHETERIZATION WITH CORONARY ANGIOGRAM N/A 10/08/2011   Procedure: LEFT HEART CATHETERIZATION WITH CORONARY ANGIOGRAM;  Surgeon: Peter M Martinique, MD;  Location: [facility] CATH LAB;  Service: Cardiovascular;  Laterality: N/A;  . LEFT HEART CATHETERIZATION WITH CORONARY ANGIOGRAM N/A 03/02/2012   Procedure: LEFT HEART CATHETERIZATION WITH CORONARY ANGIOGRAM;  Surgeon: Burnell Blanks, MD;  Location: [facility] CATH LAB;  Service: Cardiovascular;  Laterality: N/A;  . LEFT HEART CATHETERIZATION WITH CORONARY ANGIOGRAM N/A 01/04/2014   Procedure: LEFT HEART CATHETERIZATION WITH CORONARY ANGIOGRAM;  Surgeon: Troy Sine, MD;  Location: [facility] CATH LAB;  Service: Cardiovascular;  Laterality: N/A;     OB History    Gravida  2   Para      Term      Preterm      AB      Living  2     SAB      TAB      Ectopic      Multiple      Live Births              Family History  Problem Relation Age of Onset  . Heart murmur Father   . Diabetes Father   . Hypertension Father   . Hyperlipidemia Father   . Cancer Father        throat  . Diabetes Mother   . Cancer Mother        breast.uterine, ovarian  . Asthma Son   . Appendicitis Son   . Hernia Son   . Mental illness Son        Bipolar, personality d/o  . Stroke Maternal Grandmother   . Cancer Maternal Grandmother        breast  . Cancer Paternal Grandfather        lung    Social History   Tobacco Use  . Smoking status: Current Every Day Smoker    Packs/day: 0.50    Years: 20.00    Pack years: 10.00    Types: Cigarettes    Start date: 03/18/1990  . Smokeless tobacco: Never Used  Substance Use Topics  . Alcohol use: Yes    Alcohol/week: 0.0 standard drinks    Comment: maybe once a year  . Drug use: No    Home Medications Prior to Admission medications   Medication Sig Start Date End Date  Taking? Authorizing Provider  aspirin 81 MG chewable tablet Chew 81 mg by mouth daily.   Yes [provider]  carvedilol (COREG) 25 MG tablet TAKE 1 TABLET BY MOUTH TWICE DAILY WITH A MEAL Patient taking differently: Take 25 mg by mouth 2 (two) times daily with a meal. TAKE 1 TABLET BY MOUTH TWICE DAILY WITH A MEAL 10/02/19  Yes Branch, Alphonse Guild, MD  fenofibrate (TRICOR) 48 MG tablet TAKE 1 TABLET BY MOUTH  EVERY DAY Patient taking differently: Take 48 mg by mouth daily.  01/02/20  Yes Gottschalk, Ashly M, DO  FLUoxetine (PROZAC) 40 MG capsule TAKE 1 CAPSULE BY MOUTH EVERY DAY Patient taking differently: Take 40 mg by mouth daily.  12/27/19  Yes Gottschalk, Leatrice Jewels M, DO  ibuprofen (ADVIL) 200 MG tablet Take 400 mg by mouth every 6 (six) hours as needed for fever or headache.   Yes [provider]  icosapent Ethyl (VASCEPA) 1 g capsule Take 2 capsules (2 g total) by mouth 2 (two) times daily. 01/07/20  Yes Hilty, Nadean Corwin, MD  losartan (COZAAR) 25 MG tablet TAKE 1/2 TABLET BY MOUTH EVERY DAY Patient taking differently: Take 12.5 mg by mouth daily.  10/02/19  Yes Branch, Alphonse Guild, MD  medroxyPROGESTERone (PROVERA) 5 MG tablet TAKE 1 TABLET BY MOUTH DAILY Patient taking differently: Take 5 mg by mouth daily.  01/02/20  Yes Gottschalk, Ashly M, DO  metFORMIN (GLUCOPHAGE) 1000 MG tablet TAKE 1 TABLET BY MOUTH TWICE DAILY Patient taking differently: Take 1,000 mg by mouth 2 (two) times daily with a meal.  12/05/19  Yes Gottschalk, Ashly M, DO  nitroGLYCERIN (NITROSTAT) 0.4 MG SL tablet DISSOLVE 1 TABLET UNDER THE TONGUE EVERY 5 MINUTES AS NEEDED FOR CHEST PAIN. DO NOT EXCEED A TOTAL OF 3 DOSES IN 15 MINUTES. Patient taking differently: Place 0.4 mg under the tongue every 5 (five) minutes as needed for chest pain.  10/09/19  Yes Branch, Alphonse Guild, MD  potassium chloride SA (KLOR-CON) 20 MEQ tablet TAKE 1 TABLET BY MOUTH EVERY DAY Patient taking differently: Take 20 mEq by mouth every other day.   10/02/19  Yes Branch, Alphonse Guild, MD  pregabalin (LYRICA) 100 MG capsule TAKE ONE CAPSULE BY MOUTH TWICE DAILY Patient taking differently: Take 100 mg by mouth 2 (two) times daily.  12/09/19  Yes Ronnie Doss M, DO  ranolazine (RANEXA) 1000 MG SR tablet Take 1 tablet (1,000 mg total) by mouth 2 (two) times daily. 01/09/20  Yes Branch, Alphonse Guild, MD  rosuvastatin (CRESTOR) 40 MG tablet TAKE 1 TABLET BY MOUTH EVERY DAY Patient taking differently: TAKE 1 TABLET BY MOUTH EVERY DAY 10/29/19  Yes Branch, Alphonse Guild, MD  torsemide (DEMADEX) 20 MG tablet Take 40 mg by mouth daily as needed (swelling).    Yes [provider]  VICTOZA 18 MG/3ML SOPN INJECT 1.2 MG INTO SKIN EVERY DAY Patient taking differently: Inject 1.2 mg into the skin daily.  01/02/20  Yes Gottschalk, Leatrice Jewels M, DO  ACCU-CHEK AVIVA PLUS test strip USE TO TEST BLOOD SUGAR FOUR TIMES DAILY 08/06/19   Ronnie Doss M, DO  blood glucose meter kit and supplies Dispense based on patient and insurance preference. Use up to four times daily as directed. (FOR ICD-10:  E11.22). Check BG daily 06/26/18   Ronnie Doss M, DO  Insulin Pen Needle (GLOBAL EASE INJECT PEN NEEDLES) 31G X 5 MM MISC USE ONCE EVERY DAY Dx E11.9 10/02/19   Ronnie Doss M, DO    Allergies    Bee venom, Latex, and Sulfa drugs cross reactors  Review of Systems   Review of Systems  All other systems reviewed and are negative.   Physical Exam Updated Vital Signs BP 116/66   Pulse 74   Temp 98.3 F (36.8 C) (Oral)   Resp 20   Ht 5' 6"  (1.676 m)   Wt (!) 170.1 kg   SpO2 98%   BMI 60.53 kg/m   Physical Exam Vitals and  nursing note reviewed. Exam conducted with a chaperone present.  Constitutional:      Appearance: Normal appearance. She is obese. She is not diaphoretic.  HENT:     Head: Normocephalic and atraumatic.  Eyes:     General: No scleral icterus.    Conjunctiva/sclera: Conjunctivae normal.  Cardiovascular:     Rate and Rhythm: Normal  rate and regular rhythm.     Pulses: Normal pulses.     Heart sounds: Normal heart sounds.  Pulmonary:     Effort: Pulmonary effort is normal. No respiratory distress.     Breath sounds: Normal breath sounds.  Musculoskeletal:        General: Normal range of motion.     Cervical back: Normal range of motion. No rigidity.  Skin:    General: Skin is dry.     Capillary Refill: Capillary refill takes less than 2 seconds.  Neurological:     Mental Status: She is alert and oriented to person, place, and time.     GCS: GCS eye subscore is 4. GCS verbal subscore is 5. GCS motor subscore is 6.     Comments: Left leg numbness (chronic)  Psychiatric:        Mood and Affect: Mood normal.        Behavior: Behavior normal.        Thought Content: Thought content normal.      ED Results / Procedures / Treatments   Labs (all labs ordered are listed, but only abnormal results are displayed) Labs Reviewed  BASIC METABOLIC PANEL - Abnormal; Notable for the following components:      Result Value   CO2 20 (*)    All other components within normal limits  CBC - Abnormal; Notable for the following components:   WBC 12.9 (*)    All other components within normal limits  TROPONIN I (HIGH SENSITIVITY) - Abnormal; Notable for the following components:   Troponin I (High Sensitivity) 214 (*)    All other components within normal limits  TROPONIN I (HIGH SENSITIVITY) - Abnormal; Notable for the following components:   Troponin I (High Sensitivity) 320 (*)    All other components within normal limits  I-STAT BETA HCG BLOOD, ED ([facility], [facility], [facility] ONLY)    EKG None  Radiology DG Chest 2 View  Result Date: 01/13/2020 CLINICAL DATA:  Chest pain EXAM: CHEST - 2 VIEW COMPARISON:  None. FINDINGS: The heart size and mediastinal contours are within normal limits. Both lungs are clear. The visualized skeletal structures are unremarkable. IMPRESSION: No active cardiopulmonary disease. Electronically Signed   By:  Ulyses Jarred M.D.   On: 01/13/2020 05:45    Procedures .Critical Care Performed by: Corena Herter, PA-C Authorized by: Corena Herter, PA-C   Critical care provider statement:    Critical care time (minutes):  45   Critical care was necessary to treat or prevent imminent or life-threatening deterioration of the following conditions:  Cardiac failure   Critical care was time spent personally by me on the following activities:  Discussions with consultants, evaluation of patient's response to treatment, examination of patient, ordering and performing treatments and interventions, ordering and review of laboratory studies, ordering and review of radiographic studies, pulse oximetry, re-evaluation of patient's condition, obtaining history from patient or surrogate, review of old charts and blood draw for specimens   (including critical care time)  Medications Ordered in ED Medications  sodium chloride flush (NS) 0.9 % injection 3 mL (3 mLs  Intravenous Given 01/13/20 0843)    ED Course  I have reviewed the triage vital signs and the nursing notes.  Pertinent labs & imaging results that were available during my care of the patient were reviewed by me and considered in my medical decision making (see chart for details).  Clinical Course as of Jan 13 1123  Sun Jan 13, 2020  7076 Spoke with Dr. Percival Spanish who will evaluate patient.    [GG]  1020 Troponin I (High Sensitivity)(!!): 320 [GG]    Clinical Course User Index [GG] Corena Herter, PA-C   MDM Rules/Calculators/A&P                      I ordered and reviewed DG chest 2 view which demonstrates no acute cardiopulmonary findings.  Patient has mild leukocytosis to 12.9 and initial high-sensitivity troponin of 214, but remainder of patient's laboratory work-up is unremarkable.  Her vital signs are stable and within normal limits.  She is in no acute distress on my examination.  EKG is reviewed and demonstrates no significant change  from prior tracings.  Patient's medical records were reviewed and cardiac catheterization performed 08/15/2018 revealed single-vessel obstructive CAD.  99% ostial first diagonal.  LAD is stented.  RCA with diffuse 50% stenosis.  35% ramus intermediate.  Patient only has mild LV dysfunction with estimated EF 45 to 50%.  Patient's history and elevated troponin suggests unstable angina.  Will consult with her cardiology group.  Spoke with Dr. Percival Spanish.    Spoke with Dr. Percival Spanish who will evaluate patient.   Repeat troponin came back elevated from 214 to 340.  Suspect NSTEMI over unstable angina given up-trending marker.  Cardiology is aware of patient.  Plan is for admission.  Final Clinical Impression(s) / ED Diagnoses Final diagnoses:  Unstable angina [facility])    Rx / DC Orders ED Discharge Orders    None       Corena Herter, PA-C 01/13/20 [place], Wonda Olds, MD 01/16/20 [phone]

## 2020-01-14 ENCOUNTER — Encounter (HOSPITAL_COMMUNITY)
Admission: EM | Disposition: A | Discharge status: Home / Self Care | Payer: Self-pay | Source: Home / Self Care | Attending: Cardiology

## 2020-01-14 ENCOUNTER — Other Ambulatory Visit (HOSPITAL_COMMUNITY): Payer: Medicaid Other

## 2020-01-14 DIAGNOSIS — I214 Non-ST elevation (NSTEMI) myocardial infarction: Secondary | ICD-10-CM | POA: Diagnosis not present

## 2020-01-14 DIAGNOSIS — I25110 Atherosclerotic heart disease of native coronary artery with unstable angina pectoris: Secondary | ICD-10-CM

## 2020-01-14 HISTORY — PX: LEFT HEART CATH AND CORONARY ANGIOGRAPHY: CATH118249

## 2020-01-14 HISTORY — PX: CORONARY STENT INTERVENTION: CATH118234

## 2020-01-14 LAB — LIPID PANEL
Cholesterol: 107 mg/dL (ref 0–200)
HDL: 27 mg/dL — ABNORMAL LOW (ref >40)
LDL Cholesterol: 59 mg/dL (ref 0–99)
Total CHOL/HDL Ratio: 4 RATIO
Triglycerides: 105 mg/dL (ref <150)
VLDL: 21 mg/dL (ref 0–40)

## 2020-01-14 LAB — BASIC METABOLIC PANEL
Anion gap: 12 (ref 5–15)
BUN: 10 mg/dL (ref 6–20)
CO2: 19 mmol/L — ABNORMAL LOW (ref 22–32)
Calcium: 8.9 mg/dL (ref 8.9–10.3)
Chloride: 106 mmol/L (ref 98–111)
Creatinine, Ser: 0.76 mg/dL (ref 0.44–1.00)
GFR calc Af Amer: >60 mL/min (ref >60)
GFR calc non Af Amer: >60 mL/min (ref >60)
Glucose, Bld: 116 mg/dL — ABNORMAL HIGH (ref 70–99)
Potassium: 3.6 mmol/L (ref 3.5–5.1)
Sodium: 137 mmol/L (ref 135–145)

## 2020-01-14 LAB — GLUCOSE, CAPILLARY
Glucose-Capillary: 99 mg/dL (ref 70–99)
Glucose-Capillary: 99 mg/dL (ref 70–99)
Glucose-Capillary: 113 mg/dL — ABNORMAL HIGH (ref 70–99)
Glucose-Capillary: 93 mg/dL (ref 70–99)

## 2020-01-14 LAB — POCT ACTIVATED CLOTTING TIME
Activated Clotting Time: 263 seconds
Activated Clotting Time: 318 seconds

## 2020-01-14 LAB — CBC
HCT: 37.1 % (ref 36.0–46.0)
Hemoglobin: 12.3 g/dL (ref 12.0–15.0)
MCH: 30.6 pg (ref 26.0–34.0)
MCHC: 33.2 g/dL (ref 30.0–36.0)
MCV: 92.3 fL (ref 80.0–100.0)
Platelets: 258 10*3/uL (ref 150–400)
RBC: 4.02 MIL/uL (ref 3.87–5.11)
RDW: 12.5 % (ref 11.5–15.5)
WBC: 9 10*3/uL (ref 4.0–10.5)
nRBC: 0 % (ref 0.0–0.2)

## 2020-01-14 LAB — HEPARIN LEVEL (UNFRACTIONATED): Heparin Unfractionated: 0.23 IU/mL — ABNORMAL LOW (ref 0.30–0.70)

## 2020-01-14 SURGERY — LEFT HEART CATH AND CORONARY ANGIOGRAPHY
Anesthesia: LOCAL

## 2020-01-14 MED ORDER — VERAPAMIL HCL 2.5 MG/ML IV SOLN
INTRAVENOUS | Status: DC | PRN
  Administered 2020-01-14: 10 mL via INTRA_ARTERIAL

## 2020-01-14 MED ORDER — FENTANYL CITRATE (PF) 100 MCG/2ML IJ SOLN
INTRAMUSCULAR | Status: DC | PRN
  Administered 2020-01-14: 25 ug via INTRAVENOUS

## 2020-01-14 MED ORDER — MIDAZOLAM HCL 2 MG/2ML IJ SOLN
INTRAMUSCULAR | Status: AC
  Filled 2020-01-14: qty 2

## 2020-01-14 MED ORDER — HYDRALAZINE HCL 20 MG/ML IJ SOLN: 10.0000 mg | INTRAMUSCULAR | Status: AC | PRN

## 2020-01-14 MED ORDER — LIDOCAINE HCL (PF) 1 % IJ SOLN
INTRAMUSCULAR | Status: DC | PRN
  Administered 2020-01-14: 2 mL

## 2020-01-14 MED ORDER — HEPARIN (PORCINE) IN NACL 1000-0.9 UT/500ML-% IV SOLN
INTRAVENOUS | Status: AC
  Filled 2020-01-14: qty 1000

## 2020-01-14 MED ORDER — FENTANYL CITRATE (PF) 100 MCG/2ML IJ SOLN
INTRAMUSCULAR | Status: AC
  Filled 2020-01-14: qty 2

## 2020-01-14 MED ORDER — TICAGRELOR 90 MG PO TABS
90.0000 mg | ORAL_TABLET | Freq: Two times a day (BID) | ORAL | Status: DC
  Administered 2020-01-14 – 2020-01-15 (×2): 90 mg via ORAL
  Filled 2020-01-14 (×2): qty 1

## 2020-01-14 MED ORDER — LABETALOL HCL 5 MG/ML IV SOLN: 10.0000 mg | INTRAVENOUS | Status: AC | PRN

## 2020-01-14 MED ORDER — TICAGRELOR 90 MG PO TABS
ORAL_TABLET | ORAL | Status: AC
  Filled 2020-01-14: qty 2

## 2020-01-14 MED ORDER — HEPARIN SODIUM (PORCINE) 1000 UNIT/ML IJ SOLN
INTRAMUSCULAR | Status: AC
  Filled 2020-01-14: qty 1

## 2020-01-14 MED ORDER — VERAPAMIL HCL 2.5 MG/ML IV SOLN
INTRAVENOUS | Status: AC
  Filled 2020-01-14: qty 2

## 2020-01-14 MED ORDER — TICAGRELOR 90 MG PO TABS
ORAL_TABLET | ORAL | Status: DC | PRN
  Administered 2020-01-14: 180 mg via ORAL

## 2020-01-14 MED ORDER — IOHEXOL 350 MG/ML SOLN
INTRAVENOUS | Status: DC | PRN
  Administered 2020-01-14: 145 mL via INTRA_ARTERIAL

## 2020-01-14 MED ORDER — SODIUM CHLORIDE 0.9 % IV SOLN: 250.0000 mL | INTRAVENOUS | Status: DC | PRN

## 2020-01-14 MED ORDER — SODIUM CHLORIDE 0.9% FLUSH
3.0000 mL | Freq: Two times a day (BID) | INTRAVENOUS | Status: DC
  Administered 2020-01-14 – 2020-01-15 (×2): 3 mL via INTRAVENOUS

## 2020-01-14 MED ORDER — HEPARIN (PORCINE) IN NACL 1000-0.9 UT/500ML-% IV SOLN
INTRAVENOUS | Status: DC | PRN
  Administered 2020-01-14 (×2): 500 mL

## 2020-01-14 MED ORDER — SODIUM CHLORIDE 0.9 % IV SOLN: INTRAVENOUS | Status: AC

## 2020-01-14 MED ORDER — LIDOCAINE HCL (PF) 1 % IJ SOLN
INTRAMUSCULAR | Status: AC
  Filled 2020-01-14: qty 30

## 2020-01-14 MED ORDER — MIDAZOLAM HCL 2 MG/2ML IJ SOLN
INTRAMUSCULAR | Status: DC | PRN
  Administered 2020-01-14: 1 mg via INTRAVENOUS

## 2020-01-14 MED ORDER — SODIUM CHLORIDE 0.9% FLUSH: 3.0000 mL | INTRAVENOUS | Status: DC | PRN

## 2020-01-14 MED ORDER — HEPARIN SODIUM (PORCINE) 1000 UNIT/ML IJ SOLN
INTRAMUSCULAR | Status: DC | PRN
  Administered 2020-01-14: 7000 [IU] via INTRAVENOUS
  Administered 2020-01-14: 8000 [IU] via INTRAVENOUS
  Administered 2020-01-14: 3000 [IU] via INTRAVENOUS

## 2020-01-14 SURGICAL SUPPLY — 18 items
BALLN SAPPHIRE 2.0X20 (BALLOONS) ×2
BALLOON SAPPHIRE 2.0X20 (BALLOONS) ×1 IMPLANT
CATH INFINITI JR4 5F (CATHETERS) ×1 IMPLANT
CATH OPTITORQUE TIG 4.0 5F (CATHETERS) ×1 IMPLANT
CATH VISTA GUIDE 6FR XBLAD3.5 (CATHETERS) ×2 IMPLANT
DEVICE RAD COMP TR BAND LRG (VASCULAR PRODUCTS) ×1 IMPLANT
GLIDESHEATH SLEND SS 6F .021 (SHEATH) ×2 IMPLANT
GUIDEWIRE INQWIRE 1.5J.035X260 (WIRE) IMPLANT
HOVERMATT SINGLE USE (MISCELLANEOUS) ×1 IMPLANT
INQWIRE 1.5J .035X260CM (WIRE) ×4
KIT ENCORE 26 ADVANTAGE (KITS) ×2 IMPLANT
KIT HEART LEFT (KITS) ×2 IMPLANT
PACK CARDIAC CATHETERIZATION (CUSTOM PROCEDURE TRAY) ×2 IMPLANT
SHEATH PROBE COVER 6X72 (BAG) ×1 IMPLANT
STENT RESOLUTE ONYX 2.5X30 (Permanent Stent) ×2 IMPLANT
TRANSDUCER W/STOPCOCK (MISCELLANEOUS) ×2 IMPLANT
TUBING CIL FLEX 10 FLL-RA (TUBING) ×2 IMPLANT
WIRE ASAHI PROWATER 180CM (WIRE) ×1 IMPLANT

## 2020-01-14 NOTE — Interval H&P Note (Signed)
History and Physical Interval Note:  01/14/2020 8:44 AM  [name]  has presented today for surgery, with the diagnosis of NSTEMI.  The various methods of treatment have been discussed with the patient and family. After consideration of risks, benefits and other options for treatment, the patient has consented to  Procedure(s): LEFT HEART CATH AND CORONARY ANGIOGRAPHY (N/A)  PERCUTANEOUS CORONARY INTERVENTION  as a surgical intervention.  The patient's history has been reviewed, patient examined, no change in status, stable for surgery.  I have reviewed the patient's chart and labs.  Questions were answered to the patient's satisfaction.    Cath Lab Visit (complete for each Cath Lab visit)  Clinical Evaluation Leading to the Procedure:   ACS: Yes.    Non-ACS:    Anginal Classification: CCS IV  Anti-ischemic medical therapy: Maximal Therapy (2 or more classes of medications)  Non-Invasive Test Results: No non-invasive testing performed  Prior CABG: No previous CABG \  Bryan Lemma

## 2020-01-14 NOTE — Progress Notes (Signed)
ANTICOAGULATION CONSULT NOTE   Pharmacy Consult for heparin Indication: chest pain/ACS   Assessment: 38 YOF presenting with CP intermittent x 7d, hx of CAD, troponin increase.  Plan for cath in AM, not on anticoagulation PTA.  CBC wnl.  Heparin level 0.24 units/ml  Goal of Therapy:  Heparin level 0.3-0.7 units/ml Monitor platelets by anticoagulation protocol: Yes   Plan:  Increase heparin to 1950 units/hr F/u after cath  Talbert Cage, PharmD Clinical Pharmacist 01/14/2020 7:19 AM

## 2020-01-14 NOTE — Progress Notes (Signed)
TR BAND REMOVAL  LOCATION:    Right radial   DEFLATED PER PROTOCOL:    Yes.    TIME BAND OFF / DRESSING APPLIED:   1615pm clean dressing with gauze and tegaderm Secured with coban   SITE UPON ARRIVAL:    Level 0  SITE AFTER BAND REMOVAL:    Level 0  CIRCULATION SENSATION AND MOVEMENT:    Within Normal Limits   Yes.    COMMENTS:   Care instructions given to patient.

## 2020-01-15 ENCOUNTER — Other Ambulatory Visit: Payer: Self-pay

## 2020-01-15 ENCOUNTER — Ambulatory Visit: Payer: Medicaid Other | Admitting: Student

## 2020-01-15 ENCOUNTER — Inpatient Hospital Stay (HOSPITAL_COMMUNITY): Payer: Medicaid Other

## 2020-01-15 DIAGNOSIS — I5033 Acute on chronic diastolic (congestive) heart failure: Secondary | ICD-10-CM

## 2020-01-15 LAB — BASIC METABOLIC PANEL
Anion gap: 9 (ref 5–15)
BUN: 7 mg/dL (ref 6–20)
CO2: 20 mmol/L — ABNORMAL LOW (ref 22–32)
Calcium: 9 mg/dL (ref 8.9–10.3)
Chloride: 108 mmol/L (ref 98–111)
Creatinine, Ser: 0.79 mg/dL (ref 0.44–1.00)
GFR calc Af Amer: >60 mL/min (ref >60)
GFR calc non Af Amer: >60 mL/min (ref >60)
Glucose, Bld: 96 mg/dL (ref 70–99)
Potassium: 3.8 mmol/L (ref 3.5–5.1)
Sodium: 137 mmol/L (ref 135–145)

## 2020-01-15 LAB — GLUCOSE, CAPILLARY
Glucose-Capillary: 109 mg/dL — ABNORMAL HIGH (ref 70–99)
Glucose-Capillary: 222 mg/dL — ABNORMAL HIGH (ref 70–99)

## 2020-01-15 LAB — CBC
HCT: 38.1 % (ref 36.0–46.0)
Hemoglobin: 12.6 g/dL (ref 12.0–15.0)
MCH: 30.4 pg (ref 26.0–34.0)
MCHC: 33.1 g/dL (ref 30.0–36.0)
MCV: 91.8 fL (ref 80.0–100.0)
Platelets: 251 10*3/uL (ref 150–400)
RBC: 4.15 MIL/uL (ref 3.87–5.11)
RDW: 12.4 % (ref 11.5–15.5)
WBC: 9.8 10*3/uL (ref 4.0–10.5)
nRBC: 0 % (ref 0.0–0.2)

## 2020-01-15 MED ORDER — ANGIOPLASTY BOOK
Freq: Once | Status: DC
  Filled 2020-01-15: qty 1

## 2020-01-15 MED ORDER — CLOPIDOGREL BISULFATE 75 MG PO TABS: ORAL_TABLET | ORAL | 3 refills | Status: DC

## 2020-01-15 MED ORDER — TICAGRELOR 90 MG PO TABS: 90.0000 mg | ORAL_TABLET | Freq: Two times a day (BID) | ORAL | 11 refills | Status: DC

## 2020-01-15 MED ORDER — THE SENSUOUS HEART BOOK
Freq: Once | Status: DC
  Filled 2020-01-15: qty 1

## 2020-01-15 MED ORDER — LOSARTAN POTASSIUM 25 MG PO TABS: 25.0000 mg | ORAL_TABLET | Freq: Every day | ORAL | 2 refills | Status: DC

## 2020-01-15 MED ORDER — HEART ATTACK BOUNCING BOOK
Freq: Once | Status: DC
  Filled 2020-01-15: qty 1

## 2020-01-15 MED ORDER — LOSARTAN POTASSIUM 25 MG PO TABS: 25.0000 mg | ORAL_TABLET | Freq: Every day | ORAL | Status: DC

## 2020-01-15 MED FILL — BRILINTA 90 MG TABLET: 90 | 30 days supply | Qty: 60 | Fill #0

## 2020-01-15 MED FILL — LOSARTAN POTASSIUM 25 MG TA: 25 | 30 days supply | Qty: 30 | Fill #0

## 2020-01-15 NOTE — Care Management (Signed)
3845 01-15-20 Patient has Medicaid- Brilinta will cost $3.00. Medications can be sent to [facility] and patient will get a 30 day free supply Brilinta. Gala Lewandowsky, RN,BSN Case Manager

## 2020-01-15 NOTE — Progress Notes (Signed)
CARDIAC REHAB PHASE I   PRE:  Rate/Rhythm: 77 SR    BP: sitting 129/93    SaO2:   MODE:  Ambulation: 400 ft   POST:  Rate/Rhythm: 108 ST    BP: sitting 144/81     SaO2:   Pt able to walk hall slowly. SOB with distance. Sts she feels better. Discussed MI, stent, Brilinta, restrictions, smoking cessation, weight loss, diet, exercise, NTG and CRPII. Voiced understanding, requests her referral be sent to [facility].  [id number]  [name] CES, ACSM 01/15/2020 9:16 AM

## 2020-01-15 NOTE — Discharge Summary (Signed)
Discharge Summary    Patient ID: [name] MRN: [id number]; DOB: 03-08-1981  Admit date: 01/13/2020 Discharge date: 01/15/2020  Primary Care Provider: Janora Norlander, DO  Primary Cardiologist: Katelyn Dolly, MD  Primary Electrophysiologist:  None   Discharge Diagnoses    Principal Problem:   NSTEMI (non-ST elevated myocardial infarction) [facility]) Active Problems:   Coronary artery disease   Chronic diastolic CHF (congestive heart failure) ([place])   Hyperlipidemia   DM2 (diabetes mellitus, type 2) ([place])   Essential hypertension   Tobacco abuse    Diagnostic Studies/Procedures    Cardiac Catheterization 01/14/2020:  A drug-eluting stent was successfully placed using a STENT RESOLUTE ONYX 2.5X30. Postdilated to 2.7 mm  Post intervention, there is a 0% residual stenosis throughout the entire segment.Marland Kitchen  ------------  Colon Flattery LAD lesion is 40% stenosed. Prox LAD previously placed BMS stent is 30% stenosed. Prox LAD to Mid LAD lesion is 20% stenosed.  Ost 1st Diag lesion is 99% stenosed. Known from prior catheterizations  Small, nondominant RCA: Prox RCA to Dist RCA lesion is 50% stenosed.  --------------  The left ventricular systolic function is normal. The left ventricular ejection fraction is 55-65% by visual estimate. LV end diastolic pressure is normal.   CULPRIT LESION: Ostial and proximal RAMUS INTERMEDIUS (RI) 95-70%   Successful DES PCI of RI (RESOLUTE ONYX DES 2.5 mm x 30 mm--2.7 mm)  Otherwise moderate diffuse disease: Diffuse moderate LAD 20-30% stenosis and known 99% subtotal occlusion of the very small caliber 1st Diag  Preserved LVEF and normal EDP.  Diagnostic Dominance: Left  Intervention   _____________   History of Present Illness     [name] is a 39 y.o. female with a history of CAD s/p BMA to LAD with angioplasty of RI in 2013 and patent stent by repeat caths in 02/2012, 12/2013, and 02/2016. Most recent cath in 07/2018  showed patent mid-LAD stent with 99% D1 stenosis, 50% RCA, and 35% RI stenosis with medical management recommended. Also has history of chronic diastolic CHF, hypertension, hyperlipidemia, type 2 diabetes mellitus, morbid obesity,andtobacco use who presented with chest pain and was found to have a NSTEMI.  Patient presented to the ED on 01/13/20 for intermittent substernal chest pain that radiated to right shoulder and down arm for the past week. She noted associated shortness of breath and diaphoresis. Chest pain similar to prior ACS. The patient spoke with Dr. Harl Stafford about 5 days prior to presentation who recommended increasing Ranexa which only seemed to help minimally. On the night prior to presentation, patient had another episode of chest pain radiating into her right arm with associated shortness of breath and diaphoresis. She took NTG which minimally helped the pain. Pain lasted about 30 minutes. EMS was called who administered another NTG and Aspirin.   In the ED BP 123/57, pulse 74, afebrile, RR 20, 99% O2. Labs show potassium 3.8, glucose 98, creatinine 0.72. WBC 12.9, Hgb 13.2. High-sensitivity troponin elevated at  214>320. Hcg negative. CXR unremarkable. EKG with NSR and no ischemic changes. Cardiology was consulted and admitted the patient.   Hospital Course     Consultants: None.  NSTEMI High-sensitivity troponin peaked at 320. Left heart catheterization on 01/14/2020 showed 70-99% stenosis of ostial and proximal RI with otherwise moderate diffuse disease of LAD and known 99% subtotal occlusion of very small 1st diagonal. Patient underwent successful PCI with DES to RI lesion. LVEF and EDP normal. Patient tolerated procedure well. Right radial cath site soft with no  signs of hematoma. Renal function stable. Able to ambulate without any problems.  - Continue dual antiplatelet therapy with Aspirin and Brilinta.  - Continue Coreg 30m twice daily, Crestor 439mdaily, and Ranexa 100058mwice  daily.   Chronic Diastolic CHF LVEF preserved on cath. Patient euvolemic on exam. Can consider outpatient Echo if needed (initially ordered at admission but was not completed prior to discharge). - Continue home Torsemide 83m17mily as needed for edema.   Hypertension  BP mildly elevated.  - Continue Coreg 25mg18mce daily. - Increase Losartan to 25mg 72my. - Repeat BMET at follow-up visit.   Hyperlipidemia Lipid panel this admission: Total Cholesterol 107, Triglycerides 105, HDL 27, LDL 59. At LDL goal of <70 given CAD. - Continue Crestor 83mg d87m. Also on Vascepa and Fenofibrate.   Type 2 Diabetes Mellitus Hemoglobin A1c 6.1 this admission. - Can restart home medications at discharge. Restart Metformin 48 hours after cardiac catheterization.  Tobacco Use Emphasized the importance of complete cessation.  Patient seen and examined by Dr. Nelson Meda Coffeeand determined to be stable for discharge. Outpatient follow-up has been arranged. Medications as below.  Did the patient have an acute coronary syndrome (MI, NSTEMI, STEMI, etc) this admission?:  Yes                               AHA/ACC Clinical Performance & Quality Measures: 1. Aspirin prescribed? - Yes 2. ADP Receptor Inhibitor (Plavix/Clopidogrel, Brilinta/Ticagrelor or Effient/Prasugrel) prescribed (includes medically managed patients)? - Yes 3. Beta Blocker prescribed? - Yes 4. High Intensity Statin (Lipitor 40-80mg or38mstor 20-83mg) pr94mibed? - Yes 5. EF assessed during THIS hospitalization? - Yes 6. For EF <40%, was ACEI/ARB prescribed? - Not Applicable (EF >/= 40%) 7. F54%EF <40%, Aldosterone Antagonist (Spironolactone or Eplerenone) prescribed? - Not Applicable (EF >/= 40%) 8. C65%iac Rehab Phase II ordered (Included Medically managed Patients)? - Yes   _____________  Discharge Vitals Blood pressure (!) 144/81, pulse 78, temperature 97.8 F (36.6 C), temperature source Oral, resp. rate 19, height 5' 9"   (1.753 m), weight (!) 176.7 kg, SpO2 98 %.  Filed Weights   01/13/20 1421 01/14/20 0549 01/15/20 0529  Weight: (!) 180.1 kg (!) 177.5 kg (!) 176.7 kg    Labs & Radiologic Studies    CBC Recent Labs    01/14/20 0452 01/15/20 0439  WBC 9.0 9.8  HGB 12.3 12.6  HCT 37.1 38.1  MCV 92.3 91.8  PLT 258 251   Bas681Metabolic Panel Recent Labs    01/13/20 0517 01/13/20 0842 01/14/20 0452 01/15/20 0439  NA   < >  --  137 137  K   < >  --  3.6 3.8  CL   < >  --  106 108  CO2   < >  --  19* 20*  GLUCOSE   < >  --  116* 96  BUN   < >  --  10 7  CREATININE   < >  --  0.76 0.79  CALCIUM   < >  --  8.9 9.0  MG  --  1.6*  --   --    < > = values in this interval not displayed.   Liver Function Tests No results for input(s): AST, ALT, ALKPHOS, BILITOT, PROT, ALBUMIN in the last 72 hours. No results for input(s): LIPASE, AMYLASE in the last 72 hours. High Sensitivity Troponin:   Recent Labs  Lab 01/13/20 0517 01/13/20 0842  TROPONINIHS 214* 320*    BNP Invalid input(s): POCBNP D-Dimer No results for input(s): DDIMER in the last 72 hours. Hemoglobin A1C Recent Labs    01/13/20 0842  HGBA1C 6.1*   Fasting Lipid Panel Recent Labs    01/14/20 0452  CHOL 107  HDL 27*  LDLCALC 59  TRIG 105  CHOLHDL 4.0   Thyroid Function Tests No results for input(s): TSH, T4TOTAL, T3FREE, THYROIDAB in the last 72 hours.  Invalid input(s): FREET3 _____________  DG Chest 2 View  Result Date: 01/13/2020 CLINICAL DATA:  Chest pain EXAM: CHEST - 2 VIEW COMPARISON:  None. FINDINGS: The heart size and mediastinal contours are within normal limits. Both lungs are clear. The visualized skeletal structures are unremarkable. IMPRESSION: No active cardiopulmonary disease. Electronically Signed   By: Ulyses Jarred M.D.   On: 01/13/2020 05:45   CARDIAC CATHETERIZATION  Result Date: 01/14/2020  A drug-eluting stent was successfully placed using a STENT RESOLUTE ONYX 2.5X30. Postdilated to 2.7  mm  Post intervention, there is a 0% residual stenosis throughout the entire segment.Marland Kitchen  ------------  Colon Flattery LAD lesion is 40% stenosed. Prox LAD previously placed BMS stent is 30% stenosed. Prox LAD to Mid LAD lesion is 20% stenosed.  Ost 1st Diag lesion is 99% stenosed. Known from prior catheterizations  Small, nondominant RCA: Prox RCA to Dist RCA lesion is 50% stenosed.  --------------  The left ventricular systolic function is normal. The left ventricular ejection fraction is 55-65% by visual estimate. LV end diastolic pressure is normal.   CULPRIT LESION: Ostial and proximal RAMUS INTERMEDIUS (RI) 95-70%  Successful DES PCI of RI (RESOLUTE ONYX DES 2.5 mm x 30 mm--2.7 mm)  Otherwise moderate diffuse disease: Diffuse moderate LAD 20-30% stenosis and known 99% subtotal occlusion of the very small caliber 1st Diag  Preserved LVEF and normal EDP. Glenetta Hew, M.D., M.S. Interventional Cardiologist   Disposition   Patient is being discharged home today in good condition.  Follow-up Plans & Appointments    Follow-up Information    Imogene Burn, PA-C Follow up.   Specialty: Cardiology Why: Hospital follow-up scheduled for 01/28/2020 at 1:30pm. Please arrive 15 minutes early for check-in. If this date/time does not work for you, please call our office to reschedule. Contact information: [place] [address] [phone]          Discharge Instructions    Amb Referral to Cardiac Rehabilitation   Complete by: As directed    Diagnosis:  Coronary Stents NSTEMI PTCA     After initial evaluation and assessments completed: Virtual Based Care may be provided alone or in conjunction with Phase 2 Cardiac Rehab based on patient barriers.: Yes   Diet - low sodium heart healthy   Complete by: As directed    Increase activity slowly   Complete by: As directed       Discharge Medications   Allergies as of 01/15/2020      Reactions   Bee Venom Anaphylaxis, Swelling    Latex Itching   Sulfa Drugs Cross Reactors Nausea And Vomiting      Medication List    STOP taking these medications   ibuprofen 200 MG tablet Commonly known as: ADVIL     TAKE these medications   Accu-Chek Aviva Plus test strip Generic drug: glucose blood USE TO TEST BLOOD SUGAR FOUR TIMES DAILY   aspirin 81 MG chewable tablet Chew 81 mg by mouth daily.   blood  glucose meter kit and supplies Dispense based on patient and insurance preference. Use up to four times daily as directed. (FOR ICD-10:  E11.22). Check BG daily   carvedilol 25 MG tablet Commonly known as: COREG TAKE 1 TABLET BY MOUTH TWICE DAILY WITH A MEAL What changed: See the new instructions.   fenofibrate 48 MG tablet Commonly known as: TRICOR TAKE 1 TABLET BY MOUTH EVERY DAY   FLUoxetine 40 MG capsule Commonly known as: PROZAC TAKE 1 CAPSULE BY MOUTH EVERY DAY What changed: how much to take   Global Ease Inject Pen Needles 31G X 5 MM Misc Generic drug: Insulin Pen Needle USE ONCE EVERY DAY Dx E11.9   icosapent Ethyl 1 g capsule Commonly known as: VASCEPA Take 2 capsules (2 g total) by mouth 2 (two) times daily.   losartan 25 MG tablet Commonly known as: COZAAR Take 1 tablet (25 mg total) by mouth daily. Start taking on: January 16, 2020 What changed: how much to take   medroxyPROGESTERone 5 MG tablet Commonly known as: PROVERA TAKE 1 TABLET BY MOUTH DAILY   metFORMIN 1000 MG tablet Commonly known as: GLUCOPHAGE TAKE 1 TABLET BY MOUTH TWICE DAILY What changed: when to take this Notes to patient: Restart 48 hours after cardiac catheterization (evening of 01/16/2020).   nitroGLYCERIN 0.4 MG SL tablet Commonly known as: NITROSTAT DISSOLVE 1 TABLET UNDER THE TONGUE EVERY 5 MINUTES AS NEEDED FOR CHEST PAIN. DO NOT EXCEED A TOTAL OF 3 DOSES IN 15 MINUTES. What changed: See the new instructions.   potassium chloride SA 20 MEQ tablet Commonly known as: KLOR-CON TAKE 1 TABLET BY MOUTH EVERY  DAY What changed: when to take this   pregabalin 100 MG capsule Commonly known as: LYRICA TAKE ONE CAPSULE BY MOUTH TWICE DAILY   ranolazine 1000 MG SR tablet Commonly known as: RANEXA Take 1 tablet (1,000 mg total) by mouth 2 (two) times daily.   rosuvastatin 40 MG tablet Commonly known as: CRESTOR TAKE 1 TABLET BY MOUTH EVERY DAY   ticagrelor 90 MG Tabs tablet Commonly known as: BRILINTA Take 1 tablet (90 mg total) by mouth 2 (two) times daily.   torsemide 20 MG tablet Commonly known as: DEMADEX Take 40 mg by mouth daily as needed (swelling).   Victoza 18 MG/3ML Sopn Generic drug: liraglutide INJECT 1.2 MG INTO SKIN EVERY DAY What changed: See the new instructions.          Outstanding Labs/Studies   Repeat BMET at follow-up.   Duration of Discharge Encounter   Greater than 30 minutes including physician time.  Signed, Darreld Mclean, PA-C 01/15/2020, 1:12 PM

## 2020-01-15 NOTE — Progress Notes (Signed)
Order for Echo has [place] not been completed. RN spoke with Delton See, MD and stated this could be done outpatient and patient can be discharged.

## 2020-01-15 NOTE — Discharge Instructions (Signed)
Medication Changes: - Start Brilinta 90mg  twice daily in addition to your Aspirin 81mg  daily. Both of these medications help keep your new stent open. - Increase Losartan to 25mg  daily.  - Recommend stopping Advil.   Post NSTEMI: NO HEAVY LIFTING X 2 WEEKS. NO SEXUAL ACTIVITY X 2 WEEKS. NO DRIVING X 2-3 DAYS. NO SOAKING BATHS, HOT TUBS, POOLS, ETC., X 7 DAYS.  Radial Site Care: Refer to this sheet in the next few weeks. These instructions provide you with information on caring for yourself after your procedure. Your caregiver may also give you more specific instructions. Your treatment has been planned according to current medical practices, but problems sometimes occur. Call your caregiver if you have any problems or questions after your procedure. HOME CARE INSTRUCTIONS  You may shower the day after the procedure.Remove the bandage (dressing) and gently wash the site with plain soap and water.Gently pat the site dry.   Do not apply powder or lotion to the site.   Do not submerge the affected site in water for 3 to 5 days.   Inspect the site at least twice daily.   Do not flex or bend the affected arm for 24 hours.   No lifting over 5 pounds (2.3 kg) for 5 days after your procedure.   Do not drive home if you are discharged the same day of the procedure. Have someone else drive you.  What to expect:  Any bruising will usually fade within 1 to 2 weeks.   Blood that collects in the tissue (hematoma) may be painful to the touch. It should usually decrease in size and tenderness within 1 to 2 weeks.  SEEK IMMEDIATE MEDICAL CARE IF:  You have unusual pain at the radial site.   You have redness, warmth, swelling, or pain at the radial site.   You have drainage (other than a small amount of blood on the dressing).   You have chills.   You have a fever or persistent symptoms for more than 72 hours.   You have a fever and your symptoms suddenly get worse.   Your arm becomes  pale, cool, tingly, or numb.   You have heavy bleeding from the site. Hold pressure on the site.

## 2020-01-15 NOTE — Progress Notes (Addendum)
Progress Note  Patient Name: [name] Date of Encounter: 01/15/2020  Primary Cardiologist: Dina Rich, MD   Subjective   No acute overnight events. No chest pain, shortness of breath, or palpitations. Eager to go home.  Inpatient Medications    Scheduled Meds: . angioplasty book   Does not apply Once  . aspirin EC  81 mg Oral Daily  . carvedilol  25 mg Oral BID WC  . fenofibrate  54 mg Oral Daily  . FLUoxetine  40 mg Oral Daily  . heart attack bouncing book   Does not apply Once  . insulin aspart  0-20 Units Subcutaneous TID WC  . insulin aspart  0-5 Units Subcutaneous QHS  . losartan  12.5 mg Oral Daily  . potassium chloride SA  20 mEq Oral QODAY  . pregabalin  100 mg Oral BID  . ranolazine  1,000 mg Oral BID  . rosuvastatin  40 mg Oral Daily  . sodium chloride flush  3 mL Intravenous Q12H  . sodium chloride flush  3 mL Intravenous Q12H  . sodium chloride flush  3 mL Intravenous Q12H  . the sensuous heart book   Does not apply Once  . ticagrelor  90 mg Oral BID   Continuous Infusions: . sodium chloride    . nitroGLYCERIN 15 mcg/min (01/13/20 1718)   PRN Meds: sodium chloride, acetaminophen, ALPRAZolam, nitroGLYCERIN, ondansetron (ZOFRAN) IV, sodium chloride flush, torsemide, zolpidem   Vital Signs    Vitals:   01/14/20 2109 01/14/20 2355 01/15/20 0529 01/15/20 0834  BP: 114/68 126/78 123/90 (!) 144/81  Pulse: 80 83 78   Resp: (!) 22 16 19    Temp: 99.8 F (37.7 C) 98.1 F (36.7 C) 97.8 F (36.6 C)   TempSrc: Oral Oral Oral   SpO2: 93% 96% 98%   Weight:   (!) 176.7 kg   Height:        Intake/Output Summary (Last 24 hours) at 01/15/2020 1005 Last data filed at 01/15/2020 0900 Gross per 24 hour  Intake 572 ml  Output 1900 ml  Net -1328 ml   Last 3 Weights 01/15/2020 01/14/2020 01/13/2020  Weight (lbs) 389 lb 8 oz 391 lb 4.8 oz 397 lb 1.6 oz  Weight (kg) 176.676 kg 177.493 kg 180.123 kg      Telemetry    Normal sinus rhythm, rate in the  80's to 90's. - Personally Reviewed  ECG    Normal sinus rhythm, rate 78 bpm, with elevated J point in lead III, T wave inversions in leads I and aVL, and Q waves in leads V1-V2.  - Personally Reviewed  Physical Exam   GEN: Morbidly obese Caucasian female resting comfortably in no acute distress.   Neck: Supple. Unable to assess JVD due to body habitus. Cardiac: RRR. No murmurs, rubs, or gallops. Right radial cath site soft with no signs of hematoma.  Respiratory: Clear to auscultation bilaterally. No wheezes, rhonchi, or rales.  GI: Soft, non-distended, and non-tender. Bowel sounds present.  MS: No lower extremity edema. No deformity. Skin: Warm and dry. Neuro:  No focal deficits.  Psych: Normal affect. Responds appropriately.   Labs    High Sensitivity Troponin:   Recent Labs  Lab 01/13/20 0517 01/13/20 0842  TROPONINIHS 214* 320*      Chemistry Recent Labs  Lab 01/13/20 0517 01/14/20 0452 01/15/20 0439  NA 137 137 137  K 3.8 3.6 3.8  CL 104 106 108  CO2 20* 19* 20*  GLUCOSE 98 116*  96  BUN 8 10 7   CREATININE 0.72 0.76 0.79  CALCIUM 9.3 8.9 9.0  GFRNONAA >60 >60 >60  GFRAA >60 >60 >60  ANIONGAP 13 12 9      Hematology Recent Labs  Lab 01/13/20 0517 01/14/20 0452 01/15/20 0439  WBC 12.9* 9.0 9.8  RBC 4.18 4.02 4.15  HGB 13.2 12.3 12.6  HCT 39.2 37.1 38.1  MCV 93.8 92.3 91.8  MCH 31.6 30.6 30.4  MCHC 33.7 33.2 33.1  RDW 12.4 12.5 12.4  PLT 298 258 251    BNPNo results for input(s): BNP, PROBNP in the last 168 hours.   DDimer No results for input(s): DDIMER in the last 168 hours.   Radiology    CARDIAC CATHETERIZATION  Result Date: 01/14/2020  A drug-eluting stent was successfully placed using a STENT RESOLUTE ONYX 2.5X30. Postdilated to 2.7 mm  Post intervention, there is a 0% residual stenosis throughout the entire segment.01/17/20  ------------  01/16/2020 LAD lesion is 40% stenosed. Prox LAD previously placed BMS stent is 30% stenosed. Prox LAD to Mid  LAD lesion is 20% stenosed.  Ost 1st Diag lesion is 99% stenosed. Known from prior catheterizations  Small, nondominant RCA: Prox RCA to Dist RCA lesion is 50% stenosed.  --------------  The left ventricular systolic function is normal. The left ventricular ejection fraction is 55-65% by visual estimate. LV end diastolic pressure is normal.   CULPRIT LESION: Ostial and proximal RAMUS INTERMEDIUS (RI) 95-70%  Successful DES PCI of RI (RESOLUTE ONYX DES 2.5 mm x 30 mm--2.7 mm)  Otherwise moderate diffuse disease: Diffuse moderate LAD 20-30% stenosis and known 99% subtotal occlusion of the very small caliber 1st Diag  Preserved LVEF and normal EDP. Marland Kitchen, M.D., M.S. Interventional Cardiologist    Cardiac Studies   Cardiac Catheterization 01/14/2020:  A drug-eluting stent was successfully placed using a STENT RESOLUTE ONYX 2.5X30. Postdilated to 2.7 mm  Post intervention, there is a 0% residual stenosis throughout the entire segment.Bryan Lemma  ------------  01/16/2020 LAD lesion is 40% stenosed. Prox LAD previously placed BMS stent is 30% stenosed. Prox LAD to Mid LAD lesion is 20% stenosed.  Ost 1st Diag lesion is 99% stenosed. Known from prior catheterizations  Small, nondominant RCA: Prox RCA to Dist RCA lesion is 50% stenosed.  --------------  The left ventricular systolic function is normal. The left ventricular ejection fraction is 55-65% by visual estimate. LV end diastolic pressure is normal.    CULPRIT LESION: Ostial and proximal RAMUS INTERMEDIUS (RI) 95-70%   Successful DES PCI of RI (RESOLUTE ONYX DES 2.5 mm x 30 mm--2.7 mm)  Otherwise moderate diffuse disease: Diffuse moderate LAD 20-30% stenosis and known 99% subtotal occlusion of the very small caliber 1st Diag  Preserved LVEF and normal EDP.  Patient Profile     39 y.o. female with a history of CAD s/p BMA to LAD with angioplasty of RI in 2013 and patent stent by repeat caths in 02/2012, 12/2013, and 02/2016. Most recent  cath in 07/2018 showed patent mid-LAD stent with 99% D1 stenosis, 50% RCA, and 35% RI stenosis with medical management recommended. Also has history of chronic diastolic CHF, hypertension, hyperlipidemia, type 2 diabetes mellitus, morbid obesity,and tobacco use who presented with chest pain and was found to have a NSTEMI.  Assessment & Plan    NSTEMI - High-sensitivity troponin elevated at 214 >> 320.  - Left heart catheterization on 01/14/2020 showed 70-99% stenosis of ostial and proximal RI with otherwise moderate diffuse disease of LAD  and known 99% subtotal occlusion of very small 1st diagonal. Patient underwent successful PCI with DES to RI lesion. LVEF and EDP normal.  - Continue dual antiplatelet therapy with Aspirin and Brilinta.  - Continue Coreg 25mg  twice daily, Crestor 40mg  daily, and Ranexa 1000mg  twice daily.   Chronic Diastolic CHF - LVEF preserved on cath. Echo has been ordered but not done yet.  - Continue home Torsemide 40mg  daily as needed for edema.   Hypertension  - BP mostly well controlled. - Continue current medications: Losartan 12.5mg  daily and Coreg 25mg  twice daily.   Hyperlipidemia - Lipid panel this admission: Total Cholesterol 107, Triglycerides 105, HDL 27, LDL 59.  - At LDL goal of <70 given CAD. - Continue Crestor 40mg  daily and Vascepa.   Type 2 Diabetes Mellitus - Hemoglobin A1c 6.1 this admission. - Can restart home medications at discharge.   Tobacco Use - Complete cessation will be important.   For questions or updates, please contact [place] Please consult [email or web address] for contact info under     Signed, Darreld Mclean, PA-C  01/15/2020, 10:05 AM     The patient was seen, examined and discussed with Darreld Mclean, PA-C  and I agree with the above.   The patient underwent Left heart catheterization on 01/14/2020 showed 70-99% stenosis of ostial and proximal RI with otherwise moderate diffuse disease of LAD and known 99%  subtotal occlusion of very small 1st diagonal. Patient underwent successful PCI with DES to RI lesion. LVEF and EDP normal. Continue ASA, Brilinta, rosuvastatin, fenofibrate. Crea and Hb stable post cath. She is asymptomatic this morning, BP slightly elevated, I would increase losartan to 25 mg po daily and continue carvedilol. Continue home dose of torsemide. She can go home today, we will arrange for an outpatient follow up within the next 7 days.  Ena Dawley, MD 01/15/2020

## 2020-01-23 NOTE — Progress Notes (Signed)
Cardiology Office Note    Date:  01/28/2020   ID:  Minaal Struckman Uhls, DOB 20-Feb-1981, MRN [id number]  PCP:  Janora Norlander, DO  Cardiologist: Carlyle Dolly, MD EPS: None  No chief complaint on file.   History of Present Illness:  Lynna Zamorano Kuznicki is a 39 y.o. female with history of CAD BMS to the LAD with angioplasty of the RI 2013, patent stent by repeat cath 2013, 2015, 2017.  Cath 2019 patent mid LAD stent with 99% diagonal 1 stenosis 50% RCA and 35 percent RIS stenosis managed medically, chronic diastolic CHF, hypertension, HLD, DM 2, morbid obesity, tobacco abuse.  Patient presented with NSTEMI 01/13/2020.  Ranexa had been increased 5 days prior to presentation without help.  Peaked troponin at 320.  She underwent successful DES to the RI lesion and otherwise had moderate diffuse disease of the LAD and known 99% subtotal occlusion of a very small diagonal.  Patient comes in for f/u. Walking further than before. No chest pain like brought her into ER but [place] having her angina that she's had for years-much less frequent. Smoking 1/2 ppd. Under a lot of stress-taking care of her father in law and son with problems. Used to weigh 528 and lost 200 lbs in the past. When she quit smoking she gained 90 lbs back. Lost it exercising and diet changes. Some dizziness on increased losartan but BP's stable at home. Increased bruising on Plavix-insurance won't pay for brilinta    Past Medical History:  Diagnosis Date  . Allergy   . Anemia   . Anxiety   . CHF (congestive heart failure) ([place])   . Chronic kidney disease    history of kidney stones  . Coronary artery disease 09/2011   a.s/p BMS to LAD and angioplasty of RI in 09/2011 b. patent stent by cath in 02/2012, 12/2013, and 02/2016 with most recent showing 30-40% dLAD and 30-40% RCA stenosis  . Depression   . Diabetes mellitus   . Hyperlipidemia   . Hypertension   . Midsternal chest pain 03/03/2012  . Migraines   . Morbid  obesity ([place])   . Myocardial infarct ([place]) 09/2011  . Myocardial infarction ([place]) 02/2016  . Palpitations 02/17/2012  . Sleep apnea   . Ureteral stone with hydronephrosis   . Urinary tract obstruction due to kidney stone 02/15/2017    Past Surgical History:  Procedure Laterality Date  . CARDIAC CATHETERIZATION  10/08/2011   LAD: 95% mid, Ramus: 95% ostial  . CARPAL TUNNEL RELEASE    . CESAREAN SECTION    . CHOLECYSTECTOMY    . CORONARY ANGIOPLASTY WITH STENT PLACEMENT  10/08/2011   LAD: BMS, Ramus: cutting balloon angioplasty  . CORONARY STENT INTERVENTION N/A 01/14/2020   Procedure: CORONARY STENT INTERVENTION;  Surgeon: Leonie Man, MD;  Location: [facility];  Service: Cardiovascular;  Laterality: N/A;  . CYSTOSCOPY WITH RETROGRADE PYELOGRAM, URETEROSCOPY AND STENT PLACEMENT Left 02/16/2017   Procedure: CYSTOSCOPY WITH RETROGRADE PYELOGRAM, URETEROSCOPY AND STENT PLACEMENT;  Surgeon: Kathie Rhodes, MD;  Location: [facility];  Service: Urology;  Laterality: Left;  . CYSTOSCOPY WITH RETROGRADE PYELOGRAM, URETEROSCOPY AND STENT PLACEMENT Left 03/28/2017   Procedure: LEFT STENT REMOVAL LEFT RETROGRADE PYELOGRAM LEFT URETEROSCOPY   LASER LITHOTRIPSY  AND  STENT REPLACEMENT;  Surgeon: Cleon Gustin, MD;  Location: [facility];  Service: Urology;  Laterality: Left;  . LEFT HEART CATH AND CORONARY ANGIOGRAPHY N/A 08/15/2018   Procedure: LEFT HEART CATH AND CORONARY ANGIOGRAPHY;  Surgeon: Martinique, Peter M, MD;  Location: [facility];  Service: Cardiovascular;  Laterality: N/A;  . LEFT HEART CATH AND CORONARY ANGIOGRAPHY N/A 01/14/2020   Procedure: LEFT HEART CATH AND CORONARY ANGIOGRAPHY;  Surgeon: Leonie Man, MD;  Location: [facility];  Service: Cardiovascular;  Laterality: N/A;  . LEFT HEART CATHETERIZATION WITH CORONARY ANGIOGRAM N/A 10/08/2011   Procedure: LEFT HEART CATHETERIZATION WITH CORONARY ANGIOGRAM;  Surgeon: Peter M Martinique, MD;  Location: [facility];  Service:  Cardiovascular;  Laterality: N/A;  . LEFT HEART CATHETERIZATION WITH CORONARY ANGIOGRAM N/A 03/02/2012   Procedure: LEFT HEART CATHETERIZATION WITH CORONARY ANGIOGRAM;  Surgeon: Burnell Blanks, MD;  Location: [facility] C[facility] LAB;  Service: Cardiovascular;  Laterality: N/A;  . LEFT HEART CATHETERIZATION WITH CORONARY ANGIOGRAM N/A 01/04/2014   Procedure: LEFT HEART CATHETERIZATION WITH CORONARY ANGIOGRAM;  Surgeon: Troy Sine, MD;  Location: [facility] CATH LAB;  Service: Cardiovascular;  Laterality: N/A;    Current Medications: Current Meds  Medication Sig  . ACCU-CHEK AVIVA PLUS test strip USE TO TEST BLOOD SUGAR FOUR TIMES DAILY  . aspirin 81 MG chewable tablet Chew 81 mg by mouth daily.  . blood glucose meter kit and supplies Dispense based on patient and insurance preference. Use up to four times daily as directed. (FOR ICD-10:  E11.22). Check BG daily  . carvedilol (COREG) 25 MG tablet TAKE 1 TABLET BY MOUTH TWICE DAILY WITH A MEAL (Patient taking differently: Take 25 mg by mouth 2 (two) times daily with a meal. TAKE 1 TABLET BY MOUTH TWICE DAILY WITH A MEAL)  . clopidogrel (PLAVIX) 75 MG tablet take 300 mg (4 tablets) on Day 1, then take 75 mg (1 tablet) daily.  . fenofibrate (TRICOR) 48 MG tablet TAKE 1 TABLET BY MOUTH EVERY DAY (Patient taking differently: Take 48 mg by mouth daily. )  . FLUoxetine (PROZAC) 40 MG capsule TAKE 1 CAPSULE BY MOUTH EVERY DAY (Patient taking differently: Take 40 mg by mouth daily. )  . icosapent Ethyl (VASCEPA) 1 g capsule Take 2 capsules (2 g total) by mouth 2 (two) times daily.  . Insulin Pen Needle (GLOBAL EASE INJECT PEN NEEDLES) 31G X 5 MM MISC USE ONCE EVERY DAY Dx E11.9  . losartan (COZAAR) 25 MG tablet Take 1 tablet (25 mg total) by mouth daily.  . medroxyPROGESTERone (PROVERA) 5 MG tablet TAKE 1 TABLET BY MOUTH DAILY (Patient taking differently: Take 5 mg by mouth daily. )  . metFORMIN (GLUCOPHAGE) 1000 MG tablet TAKE 1 TABLET BY MOUTH TWICE DAILY (Patient  taking differently: Take 1,000 mg by mouth 2 (two) times daily with a meal. )  . nitroGLYCERIN (NITROSTAT) 0.4 MG SL tablet DISSOLVE 1 TABLET UNDER THE TONGUE EVERY 5 MINUTES AS NEEDED FOR CHEST PAIN. DO NOT EXCEED A TOTAL OF 3 DOSES IN 15 MINUTES. (Patient taking differently: Place 0.4 mg under the tongue every 5 (five) minutes as needed for chest pain. )  . potassium chloride SA (KLOR-CON) 20 MEQ tablet TAKE 1 TABLET BY MOUTH EVERY DAY (Patient taking differently: Take 20 mEq by mouth every other day. )  . pregabalin (LYRICA) 100 MG capsule TAKE ONE CAPSULE BY MOUTH TWICE DAILY (Patient taking differently: Take 100 mg by mouth 2 (two) times daily. )  . ranolazine (RANEXA) 1000 MG SR tablet Take 1 tablet (1,000 mg total) by mouth 2 (two) times daily.  . rosuvastatin (CRESTOR) 40 MG tablet TAKE 1 TABLET BY MOUTH EVERY DAY (Patient taking differently: TAKE 1  TABLET BY MOUTH EVERY DAY)  . ticagrelor (BRILINTA) 90 MG TABS tablet Take 1 tablet (90 mg total) by mouth 2 (two) times daily.  Marland Kitchen torsemide (DEMADEX) 20 MG tablet Take 40 mg by mouth daily as needed (swelling).   Donna Bernard 18 MG/3ML SOPN INJECT 1.2 MG INTO SKIN EVERY DAY (Patient taking differently: Inject 1.2 mg into the skin daily. )     Allergies:   Bee venom, Latex, and Sulfa drugs cross reactors   Social History   Socioeconomic History  . Marital status: Married    Spouse name: Barnabas Lister  . Number of children: 2  . Years of education: 10  . Highest education level: Not on file  Occupational History  . Not on file  Tobacco Use  . Smoking status: Current Every Day Smoker    Packs/day: 0.50    Years: 20.00    Pack years: 10.00    Types: Cigarettes    Start date: 03/18/1990  . Smokeless tobacco: Never Used  Substance and Sexual Activity  . Alcohol use: Yes    Alcohol/week: 0.0 standard drinks    Comment: maybe once a year  . Drug use: No  . Sexual activity: Yes    Birth control/protection: None    Comment: PCOS  Other Topics  Concern  . Not on file  Social History Narrative   Unemployed   Applied for disability   Leisure "take care of house and kids"   Walks when able   Social Determinants of Health   Financial Resource Strain:   . Difficulty of Paying Living Expenses:   Food Insecurity:   . Worried About Charity fundraiser in the Last Year:   . Arboriculturist in the Last Year:   Transportation Needs:   . Film/video editor (Medical):   Marland Kitchen Lack of Transportation (Non-Medical):   Physical Activity:   . Days of Exercise per Week:   . Minutes of Exercise per Session:   Stress:   . Feeling of Stress :   Social Connections:   . Frequency of Communication with Friends and Family:   . Frequency of Social Gatherings with Friends and Family:   . Attends Religious Services:   . Active Member of Clubs or Organizations:   . Attends Archivist Meetings:   Marland Kitchen Marital Status:      Family History:  The patient's family history includes Appendicitis in her son; Asthma in her son; Cancer in her father, maternal grandmother, mother, and paternal grandfather; Diabetes in her father and mother; Heart murmur in her father; Hernia in her son; Hyperlipidemia in her father; Hypertension in her father; Mental illness in her son; Stroke in her maternal grandmother.   ROS:   Please see the history of present illness.    ROS All other systems reviewed and are negative.   PHYSICAL EXAM:   VS:  BP 124/70   Pulse 98   Temp 97.6 F (36.4 C)   Ht 5' 10"  (1.778 m)   Wt (!) 389 lb (176.4 kg)   SpO2 98%   BMI 55.82 kg/m   Physical Exam  GEN: Obese, in no acute distress  Neck: no JVD, carotid bruits, or masses Cardiac:RRR; no murmurs, rubs, or gallops  Respiratory:  clear to auscultation bilaterally, normal work of breathing GI: soft, nontender, nondistended, + BS Ext: Right arm at cath site without hematoma or hemorrhage, lower extremities without cyanosis, clubbing, or edema, Good distal pulses  bilaterally Neuro:  Alert and Oriented x 3 Psych: euthymic mood, full affect  Wt Readings from Last 3 Encounters:  01/28/20 (!) 389 lb (176.4 kg)  01/15/20 (!) 389 lb 8 oz (176.7 kg)  10/09/19 (!) 392 lb (177.8 kg)      Studies/Labs Reviewed:   EKG:  EKG is not ordered today.   Recent Labs: 10/09/2019: ALT 23; TSH 1.230 01/13/2020: Magnesium 1.6 01/15/2020: BUN 7; Creatinine, Ser 0.79; Hemoglobin 12.6; Platelets 251; Potassium 3.8; Sodium 137   Lipid Panel    Component Value Date/Time   CHOL 107 01/14/2020 0452   CHOL 127 10/09/2019 0849   TRIG 105 01/14/2020 0452   HDL 27 (L) 01/14/2020 0452   HDL 32 (L) 10/09/2019 0849   CHOLHDL 4.0 01/14/2020 0452   VLDL 21 01/14/2020 0452   LDLCALC 59 01/14/2020 0452   LDLCALC 66 10/09/2019 0849   LDLCALC 122 (H) 10/28/2017 1221    Additional studies/ records that were reviewed today include:  Cardiac Catheterization 01/14/2020:  A drug-eluting stent was successfully placed using a STENT RESOLUTE ONYX 2.5X30. Postdilated to 2.7 mm  Post intervention, there is a 0% residual stenosis throughout the entire segment.Marland Kitchen  ------------  Colon Flattery LAD lesion is 40% stenosed. Prox LAD previously placed BMS stent is 30% stenosed. Prox LAD to Mid LAD lesion is 20% stenosed.  Ost 1st Diag lesion is 99% stenosed. Known from prior catheterizations  Small, nondominant RCA: Prox RCA to Dist RCA lesion is 50% stenosed.  --------------  The left ventricular systolic function is normal. The left ventricular ejection fraction is 55-65% by visual estimate. LV end diastolic pressure is normal.    CULPRIT LESION: Ostial and proximal RAMUS INTERMEDIUS (RI) 95-70%   Successful DES PCI of RI (RESOLUTE ONYX DES 2.5 mm x 30 mm--2.7 mm)  Otherwise moderate diffuse disease: Diffuse moderate LAD 20-30% stenosis and known 99% subtotal occlusion of the very small caliber 1st Diag  Preserved LVEF and normal EDP.       ASSESSMENT:    1. Coronary artery  disease involving native coronary artery of native heart without angina pectoris   2. Chronic diastolic CHF (congestive heart failure) ([place])   3. Essential hypertension   4. Hyperlipidemia, unspecified hyperlipidemia type   5. Type 2 diabetes mellitus with stage 2 chronic kidney disease, without long-term current use of insulin ([place])   6. Morbid obesity ([place])   7. Tobacco abuse      PLAN:  In order of problems listed above:  CAD status post NSTEMI 01/13/2020 treated with DES to the RI lesion, moderate diffuse disease of the LAD and known 99% subtotal occlusion of a very small diagonal.  Previous BMS to the LAD with angioplasty of the RI in 2013.  Continue aspirin, Plavix(insurance wouldn't cover brilinta), Coreg, Crestor and Ranexa-doing better since last cath. Start cardiac rehab  Chronic diastolic CHF on as needed torsemide-compensated  Hypertension losartan increased in the hospital will need be met today.  Blood pressure normal.  Some dizziness since this increase occurred.  She will monitor closely and discuss at follow-up.  HLD LDL 59 on Crestor 40 mg daily Vascepa and fenofibrate  DM2 A1c 6.1  Morbid obesity-discussed referral to weight loss center  Tobacco abuse smoking cessation needed-under a lot of stress-has quit with Chantix in the past.  Will prescribe.   Medication Adjustments/Labs and Tests Ordered: Current medicines are reviewed at length with the patient today.  Concerns regarding medicines are outlined above.  Medication changes, Labs and Tests ordered today  are listed in the Patient Instructions below. There are no Patient Instructions on file for this visit.   Sumner Boast, PA-C  01/28/2020 2:00 PM    [facility] [address], [place], [place]  [address] Phone: [phone]; Fax: [phone]

## 2020-01-25 ENCOUNTER — Telehealth: Payer: Self-pay

## 2020-01-25 NOTE — Telephone Encounter (Signed)
I will clarify with dr.branch

## 2020-01-25 NOTE — Telephone Encounter (Signed)
Pt's Spouse would like to know if Pt can take Ibuprofen. Pt's Spouse states per Hospital, Pt is not to take Ibuprofen.  Please call Jack(Spouse) [phone]   Thanks renee

## 2020-01-28 ENCOUNTER — Ambulatory Visit: Payer: Medicaid Other | Admitting: Physician Assistant

## 2020-01-28 ENCOUNTER — Other Ambulatory Visit: Payer: Self-pay

## 2020-01-28 ENCOUNTER — Encounter: Payer: Self-pay | Admitting: Physician Assistant

## 2020-01-28 ENCOUNTER — Other Ambulatory Visit (HOSPITAL_COMMUNITY)
Admission: RE | Admit: 2020-01-28 | Discharge: 2020-01-28 | Disposition: A | Discharge status: Home / Self Care | Payer: Medicaid Other | Source: Ambulatory Visit | Attending: Physician Assistant | Admitting: Physician Assistant

## 2020-01-28 VITALS — BP 124/70 | HR 98 | Temp 97.6°F | Ht 70.0 in | Wt 389.0 lb

## 2020-01-28 DIAGNOSIS — I5032 Chronic diastolic (congestive) heart failure: Secondary | ICD-10-CM | POA: Diagnosis not present

## 2020-01-28 DIAGNOSIS — Z720 Tobacco use: Secondary | ICD-10-CM

## 2020-01-28 DIAGNOSIS — I10 Essential (primary) hypertension: Secondary | ICD-10-CM | POA: Diagnosis not present

## 2020-01-28 DIAGNOSIS — E1122 Type 2 diabetes mellitus with diabetic chronic kidney disease: Secondary | ICD-10-CM

## 2020-01-28 DIAGNOSIS — E785 Hyperlipidemia, unspecified: Secondary | ICD-10-CM | POA: Diagnosis not present

## 2020-01-28 DIAGNOSIS — I2510 Atherosclerotic heart disease of native coronary artery without angina pectoris: Secondary | ICD-10-CM

## 2020-01-28 DIAGNOSIS — N182 Chronic kidney disease, stage 2 (mild): Secondary | ICD-10-CM

## 2020-01-28 LAB — BASIC METABOLIC PANEL
Anion gap: 8 (ref 5–15)
BUN: 11 mg/dL (ref 6–20)
CO2: 20 mmol/L — ABNORMAL LOW (ref 22–32)
Calcium: 9.1 mg/dL (ref 8.9–10.3)
Chloride: 108 mmol/L (ref 98–111)
Creatinine, Ser: 0.8 mg/dL (ref 0.44–1.00)
GFR calc Af Amer: >60 mL/min (ref >60)
GFR calc non Af Amer: >60 mL/min (ref >60)
Glucose, Bld: 102 mg/dL — ABNORMAL HIGH (ref 70–99)
Potassium: 4.1 mmol/L (ref 3.5–5.1)
Sodium: 136 mmol/L (ref 135–145)

## 2020-01-28 MED ORDER — CHANTIX STARTING MONTH PAK 0.5 MG X 11 & 1 MG X 42 PO TABS: ORAL_TABLET | ORAL | 0 refills | Status: DC

## 2020-01-28 NOTE — Patient Instructions (Signed)
Medication Instructions:  Your physician has recommended you make the following change in your medication:  Chantix Take one 0.5 mg tablet by mouth once daily for 3 days, then increase to one 0.5 mg tablet twice daily for 4 days, then increase to one 1 mg tablet twice daily  *If you need a refill on your cardiac medications before your next appointment, please call your pharmacy*   Lab Work: Your physician recommends that you return for lab work in: Today   If you have labs (blood work) drawn today and your tests are completely normal, you will receive your results only by: Marland Kitchen MyChart Message (if you have MyChart) OR . A paper copy in the mail If you have any lab test that is abnormal or we need to change your treatment, we will call you to review the results.   Testing/Procedures: NONE    Follow-Up: At [facility], you and your health needs are our priority.  As part of our continuing mission to provide you with exceptional heart care, we have created designated Provider Care Teams.  These Care Teams include your primary Cardiologist (physician) and Advanced Practice Providers (APPs -  Physician Assistants and Nurse Practitioners) who all work together to provide you with the care you need, when you need it.  We recommend signing up for the patient portal called "MyChart".  Sign up information is provided on this After Visit Summary.  MyChart is used to connect with patients for Virtual Visits (Telemedicine).  Patients are able to view lab/test results, encounter notes, upcoming appointments, etc.  Non-urgent messages can be sent to your provider as well.   To learn more about what you can do with MyChart, go to [email or web address].    Your next appointment:    June 24   The format for your next appointment:   In Person  Provider:   Dina Rich, MD   Other Instructions Thank you for choosing [facility]!

## 2020-01-28 NOTE — Telephone Encounter (Signed)
Pt has hx of CHF,NSAIDS contraindicated.Is seeing Leda Gauze today

## 2020-01-28 NOTE — Telephone Encounter (Signed)
May have already been addressed at her visit but would avoid ibuprofen, too high bleeding risk when combined with aspirin and plavix. For pain tylenol is safest   Dominga Ferry MD

## 2020-01-30 ENCOUNTER — Other Ambulatory Visit: Payer: Self-pay | Admitting: Family Medicine

## 2020-01-30 DIAGNOSIS — E1122 Type 2 diabetes mellitus with diabetic chronic kidney disease: Secondary | ICD-10-CM

## 2020-01-30 DIAGNOSIS — N182 Chronic kidney disease, stage 2 (mild): Secondary | ICD-10-CM

## 2020-02-27 ENCOUNTER — Other Ambulatory Visit: Payer: Self-pay

## 2020-02-27 MED ORDER — LOSARTAN POTASSIUM 25 MG PO TABS: 25.0000 mg | ORAL_TABLET | Freq: Every day | ORAL | 3 refills | Status: DC

## 2020-02-29 ENCOUNTER — Other Ambulatory Visit: Payer: Self-pay | Admitting: Family Medicine

## 2020-02-29 DIAGNOSIS — N182 Chronic kidney disease, stage 2 (mild): Secondary | ICD-10-CM

## 2020-02-29 DIAGNOSIS — F411 Generalized anxiety disorder: Secondary | ICD-10-CM

## 2020-02-29 DIAGNOSIS — E1122 Type 2 diabetes mellitus with diabetic chronic kidney disease: Secondary | ICD-10-CM

## 2020-03-19 ENCOUNTER — Encounter (HOSPITAL_COMMUNITY)
Admission: RE | Admit: 2020-03-19 | Discharge: 2020-03-19 | Disposition: A | Discharge status: Home / Self Care | Payer: Medicaid Other | Source: Ambulatory Visit | Attending: Cardiology | Admitting: Cardiology

## 2020-03-19 ENCOUNTER — Encounter (HOSPITAL_COMMUNITY): Payer: Self-pay

## 2020-03-19 ENCOUNTER — Other Ambulatory Visit: Payer: Self-pay

## 2020-03-19 VITALS — BP 90/60 | HR 98 | Ht 70.0 in | Wt 380.6 lb

## 2020-03-19 DIAGNOSIS — I214 Non-ST elevation (NSTEMI) myocardial infarction: Secondary | ICD-10-CM

## 2020-03-19 DIAGNOSIS — Z955 Presence of coronary angioplasty implant and graft: Secondary | ICD-10-CM | POA: Diagnosis not present

## 2020-03-19 LAB — GLUCOSE, CAPILLARY: Glucose-Capillary: 112 mg/dL — ABNORMAL HIGH (ref 70–99)

## 2020-03-19 NOTE — Progress Notes (Signed)
Cardiac/Pulmonary Rehab Medication Review by a Pharmacist  Does the patient  feel that his/her medications are working for him/her?  yes  Has the patient been experiencing any side effects to the medications prescribed?  yes  Does the patient measure his/her own blood pressure or blood glucose at home?  yes   Does the patient have any problems obtaining medications due to transportation or finances?   yes  Understanding of regimen: excellent Understanding of indications: excellent Potential of compliance: excellent  Questions asked to Determine Patient Understanding of Medication Regimen:  1. What is the name of the medication?  2. What is the medication used for?  3. When should it be taken?  4. How much should be taken?  5. How will you take it?  6. What side effects should you report?  Understanding Defined as: Excellent: All questions above are correct Good: Questions 1-4 are correct Fair: Questions 1-2 are correct  Poor: 1 or none of the above questions are correct   Pharmacist comments: Pt has high compliance, knowledge of all medications. No complaints.     Tad Moore 03/19/2020 1:54 PM ]

## 2020-03-19 NOTE — Progress Notes (Signed)
Cardiac Individual Treatment Plan  Patient Details  Name: [name] MRN: [id number] Date of Birth: [birth year] Referring Provider:     CARDIAC REHAB PHASE II ORIENTATION from 03/19/2020 in [facility]  Referring Provider Dr. Harl Bowie      Initial Encounter Date:    CARDIAC REHAB PHASE II ORIENTATION from 03/19/2020 in [facility]  Date 03/19/20      Visit Diagnosis: NSTEMI (non-ST elevated myocardial infarction) [facility])  Status post coronary artery stent placement  Patient's Home Medications on Admission:  Current Outpatient Medications:  .  ACCU-CHEK AVIVA PLUS test strip, USE TO TEST BLOOD SUGAR FOUR TIMES DAILY, Disp: 400 strip, Rfl: 3 .  aspirin 81 MG chewable tablet, Chew 81 mg by mouth daily., Disp: , Rfl:  .  blood glucose meter kit and supplies, Dispense based on patient and insurance preference. Use up to four times daily as directed. (FOR ICD-10:  E11.22). Check BG daily, Disp: 1 each, Rfl: 0 .  carvedilol (COREG) 25 MG tablet, TAKE 1 TABLET BY MOUTH TWICE DAILY WITH A MEAL (Patient taking differently: Take 25 mg by mouth 2 (two) times daily with a meal. TAKE 1 TABLET BY MOUTH TWICE DAILY WITH A MEAL), Disp: 180 tablet, Rfl: 3 .  clopidogrel (PLAVIX) 75 MG tablet, take 300 mg (4 tablets) on Day 1, then take 75 mg (1 tablet) daily., Disp: 94 tablet, Rfl: 3 .  fenofibrate (TRICOR) 48 MG tablet, TAKE 1 TABLET BY MOUTH EVERY DAY (Patient taking differently: Take 48 mg by mouth daily. ), Disp: 90 tablet, Rfl: 0 .  FLUoxetine (PROZAC) 40 MG capsule, Take 1 capsule (40 mg total) by mouth daily. (Needs to be seen before next refill), Disp: 30 capsule, Rfl: 0 .  icosapent Ethyl (VASCEPA) 1 g capsule, Take 2 capsules (2 g total) by mouth 2 (two) times daily., Disp: 120 capsule, Rfl: 11 .  Insulin Pen Needle (GLOBAL EASE INJECT PEN NEEDLES) 31G X 5 MM MISC, USE ONCE EVERY DAY, Disp: 100 each, Rfl: 3 .  losartan (COZAAR) 25 MG tablet, Take 1  tablet (25 mg total) by mouth daily., Disp: 90 tablet, Rfl: 3 .  medroxyPROGESTERone (PROVERA) 5 MG tablet, TAKE 1 TABLET BY MOUTH DAILY (Patient taking differently: Take 5 mg by mouth daily. ), Disp: 90 tablet, Rfl: 0 .  metFORMIN (GLUCOPHAGE) 1000 MG tablet, TAKE 1 TABLET BY MOUTH TWICE DAILY (Patient taking differently: Take 1,000 mg by mouth 2 (two) times daily with a meal. ), Disp: 60 tablet, Rfl: 5 .  nitroGLYCERIN (NITROSTAT) 0.4 MG SL tablet, DISSOLVE 1 TABLET UNDER THE TONGUE EVERY 5 MINUTES AS NEEDED FOR CHEST PAIN. DO NOT EXCEED A TOTAL OF 3 DOSES IN 15 MINUTES. (Patient taking differently: Place 0.4 mg under the tongue every 5 (five) minutes as needed for chest pain. ), Disp: 25 tablet, Rfl: 3 .  potassium chloride SA (KLOR-CON) 20 MEQ tablet, TAKE 1 TABLET BY MOUTH EVERY DAY (Patient taking differently: Take 20 mEq by mouth every other day. ), Disp: 90 tablet, Rfl: 3 .  pregabalin (LYRICA) 100 MG capsule, TAKE ONE CAPSULE BY MOUTH TWICE DAILY (Patient taking differently: Take 100 mg by mouth 2 (two) times daily. ), Disp: 60 capsule, Rfl: 5 .  ranolazine (RANEXA) 1000 MG SR tablet, Take 1 tablet (1,000 mg total) by mouth 2 (two) times daily., Disp: 180 tablet, Rfl: 3 .  rosuvastatin (CRESTOR) 40 MG tablet, TAKE 1 TABLET BY MOUTH EVERY DAY (Patient taking differently: TAKE  1 TABLET BY MOUTH EVERY DAY), Disp: 90 tablet, Rfl: 3 .  torsemide (DEMADEX) 20 MG tablet, Take 40 mg by mouth daily as needed (swelling). , Disp: , Rfl:  .  VICTOZA 18 MG/3ML SOPN, INJECT 1.2 MG INTO SKIN EVERY DAY, Disp: 3 mL, Rfl: 0 .  varenicline (CHANTIX STARTING MONTH PAK) 0.5 MG X 11 & 1 MG X 42 tablet, Take one 0.5 mg tablet by mouth once daily for 3 days, then increase to one 0.5 mg tablet twice daily for 4 days, then increase to one 1 mg tablet twice daily. (Patient not taking: Reported on 03/19/2020), Disp: 53 tablet, Rfl: 0  Past Medical History: Past Medical History:  Diagnosis Date  . Allergy   . Anemia   .  Anxiety   . CHF (congestive heart failure) ([place])   . Chronic kidney disease    history of kidney stones  . Coronary artery disease 09/2011   a.s/p BMS to LAD and angioplasty of RI in 09/2011 b. patent stent by cath in 02/2012, 12/2013, and 02/2016 with most recent showing 30-40% dLAD and 30-40% RCA stenosis  . Depression   . Diabetes mellitus   . Hyperlipidemia   . Hypertension   . Midsternal chest pain 03/03/2012  . Migraines   . Morbid obesity ([place])   . Myocardial infarct ([place]) 09/2011  . Myocardial infarction ([place]) 02/2016  . Palpitations 02/17/2012  . Sleep apnea   . Ureteral stone with hydronephrosis   . Urinary tract obstruction due to kidney stone 02/15/2017    Tobacco Use: Social History   Tobacco Use  Smoking Status Current Every Day Smoker  . Packs/day: 0.50  . Years: 20.00  . Pack years: 10.00  . Types: Cigarettes  . Start date: 03/18/1990  Smokeless Tobacco Never Used    Labs: Recent Review Flowsheet Data    Labs for ITP Cardiac and Pulmonary Rehab Latest Ref Rng & Units 10/04/2018 06/05/2019 10/09/2019 01/13/2020 01/14/2020   Cholestrol 0 - 200 mg/dL - 213(H) 127 - 107   LDLCALC 0 - 99 mg/dL - 141(H) 66 - 59   HDL >40 mg/dL - 30(L) 32(L) - 27(L)   Trlycerides <150 mg/dL - 232(H) 171(H) - 105   Hemoglobin A1c 4.8 - 5.6 % 6.6 7.3(H) 6.1 6.1(H) -   TCO2 0 - 100 mmol/L - - - - -      Capillary Blood Glucose: Lab Results  Component Value Date   GLUCAP 112 (H) 03/19/2020   GLUCAP 222 (H) 01/15/2020   GLUCAP 109 (H) 01/15/2020   GLUCAP 93 01/14/2020   GLUCAP 113 (H) 01/14/2020     Exercise Target Goals: Exercise Program Goal: Individual exercise prescription set using results from initial 6 min walk test and THRR while considering  patient's activity barriers and safety.   Exercise Prescription Goal: Starting with aerobic activity 30 plus minutes a day, 3 days per week for initial exercise prescription. Provide home exercise prescription and guidelines that  participant acknowledges understanding prior to discharge.  Activity Barriers & Risk Stratification:  Activity Barriers & Cardiac Risk Stratification - 03/19/20 1407      Activity Barriers & Cardiac Risk Stratification   Activity Barriers Deconditioning;Shortness of Breath;Other (comment)    Comments Diabetic neuropathy in both legs    Cardiac Risk Stratification High           6 Minute Walk:  6 Minute Walk    Row Name 03/19/20 1352         6  Minute Walk   Phase Initial     Distance 600 feet     Walk Time 6 minutes     # of Rest Breaks 1     MPH 1.13     METS 1.57     RPE 13     Perceived Dyspnea  15     VO2 Peak 5.52     Symptoms Yes (comment)     Comments Took one seated rest break due to SOB and L leg fatigue     Resting HR 94 bpm     Resting BP 90/60     Resting Oxygen Saturation  98 %     Exercise Oxygen Saturation  during 6 min walk 97 %     Max Ex. HR 114 bpm     Max Ex. BP 112/64     2 Minute Post BP 98/66            Oxygen Initial Assessment:   Oxygen Re-Evaluation:   Oxygen Discharge (Final Oxygen Re-Evaluation):   Initial Exercise Prescription:  Initial Exercise Prescription - 03/19/20 1300      Date of Initial Exercise RX and Referring Provider   Date 03/19/20    Referring Provider Dr. Harl Bowie    Expected Discharge Date 06/19/20      Arm Ergometer   Level 1    Watts 42.3    Minutes 22      T5 Nustep   Level 1    SPM 80    Minutes 17      Prescription Details   Frequency (times per week) 3    Duration Progress to 30 minutes of continuous aerobic without signs/symptoms of physical distress      Intensity   THRR 40-80% of Max Heartrate [id number]    Ratings of Perceived Exertion 11-13    Perceived Dyspnea 0-4      Progression   Progression Continue progressive overload as per policy without signs/symptoms or physical distress.      Resistance Training   Training Prescription Yes    Weight 1    Reps 10-15            Perform Capillary Blood Glucose checks as needed.  Exercise Prescription Changes:   Exercise Comments:   Exercise Goals and Review:   Exercise Goals    Row Name 03/19/20 1435             Exercise Goals   Increase Physical Activity Yes       Intervention Provide advice, education, support and counseling about physical activity/exercise needs.;Develop an individualized exercise prescription for aerobic and resistive training based on initial evaluation findings, risk stratification, comorbidities and participant's personal goals.       Expected Outcomes Short Term: Attend rehab on a regular basis to increase amount of physical activity.;Long Term: Add in home exercise to make exercise part of routine and to increase amount of physical activity.;Long Term: Exercising regularly at least 3-5 days a week.       Increase Strength and Stamina Yes       Intervention Provide advice, education, support and counseling about physical activity/exercise needs.;Develop an individualized exercise prescription for aerobic and resistive training based on initial evaluation findings, risk stratification, comorbidities and participant's personal goals.       Expected Outcomes Short Term: Increase workloads from initial exercise prescription for resistance, speed, and METs.;Short Term: Perform resistance training exercises routinely during rehab and add in resistance training at home;Long Term: Improve cardiorespiratory  fitness, muscular endurance and strength as measured by increased METs and functional capacity (6MWT)       Able to understand and use rate of perceived exertion (RPE) scale Yes       Intervention Provide education and explanation on how to use RPE scale       Expected Outcomes Short Term: Able to use RPE daily in rehab to express subjective intensity level;Long Term:  Able to use RPE to guide intensity level when exercising independently       Able to understand and use Dyspnea scale Yes        Intervention Provide education and explanation on how to use Dyspnea scale       Expected Outcomes Short Term: Able to use Dyspnea scale daily in rehab to express subjective sense of shortness of breath during exertion;Long Term: Able to use Dyspnea scale to guide intensity level when exercising independently       Knowledge and understanding of Target Heart Rate Range (THRR) Yes       Intervention Provide education and explanation of THRR including how the numbers were predicted and where they are located for reference       Expected Outcomes Short Term: Able to state/look up THRR;Short Term: Able to use daily as guideline for intensity in rehab;Long Term: Able to use THRR to govern intensity when exercising independently       Able to check pulse independently Yes       Intervention Provide education and demonstration on how to check pulse in carotid and radial arteries.;Review the importance of being able to check your own pulse for safety during independent exercise       Expected Outcomes Short Term: Able to explain why pulse checking is important during independent exercise;Long Term: Able to check pulse independently and accurately       Understanding of Exercise Prescription Yes       Intervention Provide education, explanation, and written materials on patient's individual exercise prescription       Expected Outcomes Short Term: Able to explain program exercise prescription              Exercise Goals Re-Evaluation :    Discharge Exercise Prescription (Final Exercise Prescription Changes):   Nutrition:  Target Goals: Understanding of nutrition guidelines, daily intake of sodium <1526m, cholesterol <2072m calories 30% from fat and 7% or less from saturated fats, daily to have 5 or more servings of fruits and vegetables.  Biometrics:  Pre Biometrics - 03/19/20 1433      Pre Biometrics   Height 5' 10"  (1.778 m)    Weight 172.6 kg    Waist Circumference 64 inches    Hip  Circumference 62 inches    Waist to Hip Ratio 1.03 %    BMI (Calculated) 54.61    Triceps Skinfold 32 mm    % Body Fat 61.1 %    Grip Strength 27.3 kg    Flexibility 0 in    Single Leg Stand 4.47 seconds            Nutrition Therapy Plan and Nutrition Goals:  Nutrition Therapy & Goals - 03/19/20 1447      Personal Nutrition Goals   Comments Patient's medificts diet assessment score was 3. She says she met with an RD 2 years ago but would like to attend the RD class. She says she follows a diabetic heart healthy diet. Will continue to monitor.      Intervention Plan  Intervention Nutrition handout(s) given to patient.           Nutrition Assessments:  Nutrition Assessments - 03/19/20 1448      MEDFICTS Scores   Pre Score 3           Nutrition Goals Re-Evaluation:   Nutrition Goals Discharge (Final Nutrition Goals Re-Evaluation):   Psychosocial: Target Goals: Acknowledge presence or absence of significant depression and/or stress, maximize coping skills, provide positive support system. Participant is able to verbalize types and ability to use techniques and skills needed for reducing stress and depression.  Initial Review & Psychosocial Screening:  Initial Psych Review & Screening - 03/19/20 1443      Initial Review   Current issues with Current Depression;History of Depression;Current Anxiety/Panic      Family Dynamics   Good Support System? Yes    Comments Patient reports having good family support.      Barriers   Psychosocial barriers to participate in program Psychosocial barriers identified (see note)      Screening Interventions   Interventions Encouraged to exercise;Provide feedback about the scores to participant;To provide support and resources with identified psychosocial needs    Expected Outcomes Long Term Goal: Stressors or current issues are controlled or eliminated.;Short Term goal: Identification and review with participant of any Quality  of Life or Depression concerns found by scoring the questionnaire.;Long Term goal: The participant improves quality of Life and PHQ9 Scores as seen by post scores and/or verbalization of changes           Quality of Life Scores:  Quality of Life - 03/19/20 1315      Quality of Life   Select Quality of Life      Quality of Life Scores   Health/Function Pre 18 %    Socioeconomic Pre 22.75 %    Psych/Spiritual Pre 22.5 %    Family Pre 30 %    GLOBAL Pre 21.7 %          Scores of 19 and below usually indicate a poorer quality of life in these areas.  A difference of  2-3 points is a clinically meaningful difference.  A difference of 2-3 points in the total score of the Quality of Life Index has been associated with significant improvement in overall quality of life, self-image, physical symptoms, and general health in studies assessing change in quality of life.  PHQ-9: Recent Review Flowsheet Data    Depression screen [facility] 2/9 03/19/2020 10/09/2019 06/05/2019 10/04/2018 07/17/2018   Decreased Interest 1 0 0 0 0   Down, Depressed, Hopeless 0 1 0 0 0   PHQ - 2 Score 1 1 0 0 0   Altered sleeping 2 1 0 0 -   Tired, decreased energy 1 1 0 0 -   Change in appetite 1 0 0 0 -   Feeling bad or failure about yourself  0 0 0 0 -   Trouble concentrating 1 0 0 0 -   Moving slowly or fidgety/restless 0 0 0 0 -   Suicidal thoughts 0 0 0 0 -   PHQ-9 Score 6 3 0 0 -   Difficult doing work/chores Not difficult at all Not difficult at all - Not difficult at all -     Interpretation of Total Score  Total Score Depression Severity:  1-4 = Minimal depression, 5-9 = Mild depression, 10-14 = Moderate depression, 15-19 = Moderately severe depression, 20-27 = Severe depression   Psychosocial Evaluation and Intervention:  Psychosocial Evaluation - 03/19/20 1444      Psychosocial Evaluation & Interventions   Interventions Encouraged to exercise with the program and follow exercise prescription;Stress  management education;Relaxation education    Comments Patient's initial QOL score was 21.7 % and her PHQ-9 score was 6. She is currently on Fluoxetine 40 mg daily to treat Depression and Anxiety. She says her depression is mainly due to her health and obesity and she gets anxious in crowds. She feels her depression and anxiety are managed on Fluoxetine. Will continue to monitor for progress.    Expected Outcomes Patient's depression and anxiety will be managed at discharge with no additional psychosocial issues identified.    Continue Psychosocial Services  Follow up required by staff           Psychosocial Re-Evaluation:   Psychosocial Discharge (Final Psychosocial Re-Evaluation):   Vocational Rehabilitation: Provide vocational rehab assistance to qualifying candidates.   Vocational Rehab Evaluation & Intervention:  Vocational Rehab - 03/19/20 1448      Initial Vocational Rehab Evaluation & Intervention   Assessment shows need for Vocational Rehabilitation No      Vocational Rehab Re-Evaulation   Comments Patient is unemployed and is currently seeking to get on disablilty.           Education: Education Goals: Education classes will be provided on a weekly basis, covering required topics. Participant will state understanding/return demonstration of topics presented.  Learning Barriers/Preferences:  Learning Barriers/Preferences - 03/19/20 1448      Learning Barriers/Preferences   Learning Barriers None    Learning Preferences Skilled Demonstration           Education Topics: Hypertension, Hypertension Reduction -Define heart disease and high blood pressure. Discus how high blood pressure affects the body and ways to reduce high blood pressure.   Exercise and Your Heart -Discuss why it is important to exercise, the FITT principles of exercise, normal and abnormal responses to exercise, and how to exercise safely.   Angina -Discuss definition of angina, causes of  angina, treatment of angina, and how to decrease risk of having angina.   Cardiac Medications -Review what the following cardiac medications are used for, how they affect the body, and side effects that may occur when taking the medications.  Medications include Aspirin, Beta blockers, calcium channel blockers, ACE Inhibitors, angiotensin receptor blockers, diuretics, digoxin, and antihyperlipidemics.   Congestive Heart Failure -Discuss the definition of CHF, how to live with CHF, the signs and symptoms of CHF, and how keep track of weight and sodium intake.   Heart Disease and Intimacy -Discus the effect sexual activity has on the heart, how changes occur during intimacy as we age, and safety during sexual activity.   Smoking Cessation / COPD -Discuss different methods to quit smoking, the health benefits of quitting smoking, and the definition of COPD.   Nutrition I: Fats -Discuss the types of cholesterol, what cholesterol does to the heart, and how cholesterol levels can be controlled.   Nutrition II: Labels -Discuss the different components of food labels and how to read food label   Heart Parts/Heart Disease and PAD -Discuss the anatomy of the heart, the pathway of blood circulation through the heart, and these are affected by heart disease.   Stress I: Signs and Symptoms -Discuss the causes of stress, how stress may lead to anxiety and depression, and ways to limit stress.   Stress II: Relaxation -Discuss different types of relaxation techniques to limit stress.   Warning Signs  of Stroke / TIA -Discuss definition of a stroke, what the signs and symptoms are of a stroke, and how to identify when someone is having stroke.   Knowledge Questionnaire Score:  Knowledge Questionnaire Score - 03/19/20 1448      Knowledge Questionnaire Score   Pre Score 26/28           Core Components/Risk Factors/Patient Goals at Admission:  Personal Goals and Risk Factors at  Admission - 03/19/20 1449      Core Components/Risk Factors/Patient Goals on Admission    Weight Management Obesity;Yes    Intervention Weight Management: Develop a combined nutrition and exercise program designed to reach desired caloric intake, while maintaining appropriate intake of nutrient and fiber, sodium and fats, and appropriate energy expenditure required for the weight goal.;Weight Management: Provide education and appropriate resources to help participant work on and attain dietary goals.;Weight Management/Obesity: Establish reasonable short term and long term weight goals.;Obesity: Provide education and appropriate resources to help participant work on and attain dietary goals.    Admit Weight 280 lb 9.6 oz (127.3 kg)    Goal Weight: Short Term 273 lb 1.6 oz (123.9 kg)    Goal Weight: Long Term 265 lb 9.6 oz (120.5 kg)    Expected Outcomes Weight Maintenance: Understanding of the daily nutrition guidelines, which includes 25-35% calories from fat, 7% or less cal from saturated fats, less than 224m cholesterol, less than 1.5gm of sodium, & 5 or more servings of fruits and vegetables daily;Short Term: Continue to assess and modify interventions until short term weight is achieved    Tobacco Cessation Yes    Number of packs per day 6-7 cigerattes/day    Intervention Assist the participant in steps to quit. Provide individualized education and counseling about committing to Tobacco Cessation, relapse prevention, and pharmacological support that can be provided by physician.;OAdvice worker assist with locating and accessing local/national Quit Smoking programs, and support quit date choice.    Expected Outcomes Short Term: Will quit all tobacco product use, adhering to prevention of relapse plan.    Diabetes Yes    Intervention Provide education about signs/symptoms and action to take for hypo/hyperglycemia.;Provide education about proper nutrition, including hydration, and  aerobic/resistive exercise prescription along with prescribed medications to achieve blood glucose in normal ranges: Fasting glucose 65-99 mg/dL    Expected Outcomes Short Term: Participant verbalizes understanding of the signs/symptoms and immediate care of hyper/hypoglycemia, proper foot care and importance of medication, aerobic/resistive exercise and nutrition plan for blood glucose control.    Personal Goal Other Yes    Personal Goal Lose 15 lbs in program and continue to lose weight. Be able to walk 50 feet without getting SOB or fatigued.    Intervention Patient will participate in CR 3 days/week and supplement with exercise at home 2 days/week.    Expected Outcomes Patient will complete the program and meet both program and personal goals.           Core Components/Risk Factors/Patient Goals Review:    Core Components/Risk Factors/Patient Goals at Discharge (Final Review):    ITP Comments:   Comments: Patient arrived for 1st visit/orientation/education at 1230. Patient was referred to CR by Dr. BHarl Bowiedue to NSTEMI (I21.4) and Status Post Coronary Artery Stent Placement (Z95.5). During orientation advised patient on arrival and appointment times what to wear, what to do before, during and after exercise. Reviewed attendance and class policy. Talked about inclement weather and class consultation policy. Pt is scheduled to return  Cardiac Rehab on 03/24/20 at 8:15. Pt was advised to come to class 15 minutes before class starts. Patient was also given instructions on meeting with the dietician and attending the Family Structure classes. Discussed RPE/Dpysnea scales. Discussed initial THR and how to find their radial and/or carotid pulse. Discussed the initial exercise prescription and how this effects their progress. Pt is eager to get started. Patient participated in warm up stretches followed by light weights and resistance bands. Patient attempted to complete 6 minute walk test. She had to  stop and rest during the test due to SOB and left leg fatigue. She was not able to walk the total 6 minutes. Patient was measured for the equipment. Discussed equipment safety with patient. Took patient pre-anthropometric measurements. Patient finished visit at 1430.

## 2020-03-20 ENCOUNTER — Ambulatory Visit: Payer: Medicaid Other | Admitting: Cardiology

## 2020-03-23 ENCOUNTER — Other Ambulatory Visit: Payer: Self-pay | Admitting: Family Medicine

## 2020-03-23 DIAGNOSIS — E785 Hyperlipidemia, unspecified: Secondary | ICD-10-CM

## 2020-03-23 DIAGNOSIS — F411 Generalized anxiety disorder: Secondary | ICD-10-CM

## 2020-03-23 DIAGNOSIS — N182 Chronic kidney disease, stage 2 (mild): Secondary | ICD-10-CM

## 2020-03-23 DIAGNOSIS — E1122 Type 2 diabetes mellitus with diabetic chronic kidney disease: Secondary | ICD-10-CM

## 2020-03-23 DIAGNOSIS — E1169 Type 2 diabetes mellitus with other specified complication: Secondary | ICD-10-CM

## 2020-03-24 ENCOUNTER — Other Ambulatory Visit: Payer: Self-pay

## 2020-03-24 ENCOUNTER — Encounter (HOSPITAL_COMMUNITY)
Admission: RE | Admit: 2020-03-24 | Discharge: 2020-03-24 | Disposition: A | Discharge status: Home / Self Care | Payer: Medicaid Other | Source: Ambulatory Visit | Attending: Cardiology | Admitting: Cardiology

## 2020-03-24 VITALS — Wt 378.5 lb

## 2020-03-24 DIAGNOSIS — Z955 Presence of coronary angioplasty implant and graft: Secondary | ICD-10-CM | POA: Diagnosis not present

## 2020-03-24 DIAGNOSIS — I214 Non-ST elevation (NSTEMI) myocardial infarction: Secondary | ICD-10-CM | POA: Diagnosis not present

## 2020-03-24 NOTE — Progress Notes (Signed)
Daily Session Note  Patient Details  Name: [name] MRN: [id number] Date of Birth: Aug 29, 1981 Referring Provider:     CARDIAC REHAB PHASE II ORIENTATION from 03/19/2020 in [place]  Referring Provider Dr. Harl Bowie      Encounter Date: 03/24/2020  Check In:  Session Check In - 03/24/20 0836      Check-In   Supervising physician immediately available to respond to emergencies See telemetry face sheet for immediately available ER MD    Location [facility]    Staff Present Algis Downs, Exercise Physiologist;Danea Manter Kris Mouton, MS, ACSM-CEP, Exercise Physiologist    Virtual Visit No    Medication changes reported     No    Fall or balance concerns reported    No    Tobacco Cessation No Change    Warm-up and Cool-down Performed as group-led instruction    Resistance Training Performed Yes    VAD Patient? No    PAD/SET Patient? No      Pain Assessment   Currently in Pain? No/denies    Pain Score 0-No pain    Multiple Pain Sites No           Capillary Blood Glucose: No results found for this or any previous visit (from the past 24 hour(s)).    Social History   Tobacco Use  Smoking Status Current Every Day Smoker  . Packs/day: 0.50  . Years: 20.00  . Pack years: 10.00  . Types: Cigarettes  . Start date: 03/18/1990  Smokeless Tobacco Never Used    Goals Met:  Independence with exercise equipment Exercise tolerated well No report of cardiac concerns or symptoms Strength training completed today  Goals Unmet:  Not Applicable  Comments: checkout time is 0915   Dr. Kate Sable is Medical Director for [facility].

## 2020-03-26 ENCOUNTER — Encounter (HOSPITAL_COMMUNITY)
Admission: RE | Admit: 2020-03-26 | Discharge: 2020-03-26 | Disposition: A | Discharge status: Home / Self Care | Payer: Medicaid Other | Source: Ambulatory Visit | Attending: Cardiology | Admitting: Cardiology

## 2020-03-26 ENCOUNTER — Other Ambulatory Visit: Payer: Self-pay

## 2020-03-26 DIAGNOSIS — I214 Non-ST elevation (NSTEMI) myocardial infarction: Secondary | ICD-10-CM

## 2020-03-26 DIAGNOSIS — Z955 Presence of coronary angioplasty implant and graft: Secondary | ICD-10-CM

## 2020-03-26 NOTE — Progress Notes (Signed)
Daily Session Note  Patient Details  Name: [name] MRN: [id number] Date of Birth: 1981/04/01 Referring Provider:     CARDIAC REHAB PHASE II ORIENTATION from 03/19/2020 in [place]  Referring Provider Dr. Harl Bowie      Encounter Date: 03/26/2020  Check In:   Capillary Blood Glucose: No results found for this or any previous visit (from the past 24 hour(s)).    Social History   Tobacco Use  Smoking Status Current Every Day Smoker  . Packs/day: 0.50  . Years: 20.00  . Pack years: 10.00  . Types: Cigarettes  . Start date: 03/18/1990  Smokeless Tobacco Never Used    Goals Met:  Independence with exercise equipment Exercise tolerated well No report of cardiac concerns or symptoms Strength training completed today  Goals Unmet:  Not Applicable  Comments: session 8:15-9:15   Dr. Kate Sable is Medical Director for [facility].

## 2020-03-28 ENCOUNTER — Encounter (HOSPITAL_COMMUNITY): Payer: Medicaid Other

## 2020-03-31 ENCOUNTER — Encounter (HOSPITAL_COMMUNITY): Payer: Medicaid Other

## 2020-03-31 DIAGNOSIS — Z955 Presence of coronary angioplasty implant and graft: Secondary | ICD-10-CM | POA: Insufficient documentation

## 2020-03-31 DIAGNOSIS — I214 Non-ST elevation (NSTEMI) myocardial infarction: Secondary | ICD-10-CM | POA: Insufficient documentation

## 2020-04-02 ENCOUNTER — Other Ambulatory Visit: Payer: Self-pay

## 2020-04-02 ENCOUNTER — Encounter (HOSPITAL_COMMUNITY)
Admission: RE | Admit: 2020-04-02 | Discharge: 2020-04-02 | Disposition: A | Discharge status: Home / Self Care | Payer: Medicaid Other | Source: Ambulatory Visit | Attending: Cardiology | Admitting: Cardiology

## 2020-04-02 VITALS — Wt 382.7 lb

## 2020-04-02 DIAGNOSIS — I214 Non-ST elevation (NSTEMI) myocardial infarction: Secondary | ICD-10-CM

## 2020-04-02 DIAGNOSIS — Z955 Presence of coronary angioplasty implant and graft: Secondary | ICD-10-CM | POA: Diagnosis not present

## 2020-04-02 NOTE — Progress Notes (Signed)
Daily Session Note  Patient Details  Name: [name] MRN: [id number] Date of Birth: 08/29/1981 Referring Provider:     CARDIAC REHAB PHASE II ORIENTATION from 03/19/2020 in [facility]  Referring Provider Dr. Branch      Encounter Date: 04/02/2020  Check In:  Session Check In - 04/02/20 0818      Check-In   Supervising physician immediately available to respond to emergencies See telemetry face sheet for immediately available ER MD    Location [facility]    Staff Present Vaniece Hatchett, Exercise Physiologist;Dalton Fletcher, MS, ACSM-CEP, Exercise Physiologist;Debra Johnson, RN, BSN    Virtual Visit No    Medication changes reported     No    Fall or balance concerns reported    No    Tobacco Cessation No Change    Current number of cigarettes/nicotine per day     7    Warm-up and Cool-down Performed as group-led instruction    Resistance Training Performed Yes    VAD Patient? No    PAD/SET Patient? No      Pain Assessment   Currently in Pain? No/denies    Pain Score 0-No pain    Multiple Pain Sites No           Capillary Blood Glucose: No results found for this or any previous visit (from the past 24 hour(s)).    Social History   Tobacco Use  Smoking Status Current Every Day Smoker  . Packs/day: 0.50  . Years: 20.00  . Pack years: 10.00  . Types: Cigarettes  . Start date: 03/18/1990  Smokeless Tobacco Never Used    Goals Met:  Independence with exercise equipment Exercise tolerated well No report of cardiac concerns or symptoms Strength training completed today  Goals Unmet:  Not Applicable  Comments: checkout time is 0915   Dr. Suresh Koneswaran is Medical Director for [facility]. 

## 2020-04-02 NOTE — Progress Notes (Signed)
Cardiac Individual Treatment Plan  Patient Details  Name: [name] MRN: [id number] Date of Birth: Dec 05, 1980 Referring Provider:     CARDIAC REHAB PHASE II ORIENTATION from 03/19/2020 in [place]  Referring Provider Dr. Harl Bowie      Initial Encounter Date:    CARDIAC REHAB PHASE II ORIENTATION from 03/19/2020 in [place]  Date 03/19/20      Visit Diagnosis: NSTEMI (non-ST elevated myocardial infarction) [facility])  Status post coronary artery stent placement  Patient's Home Medications on Admission:  Current Outpatient Medications:  .  ACCU-CHEK AVIVA PLUS test strip, USE TO TEST BLOOD SUGAR FOUR TIMES DAILY, Disp: 400 strip, Rfl: 3 .  aspirin 81 MG chewable tablet, Chew 81 mg by mouth daily., Disp: , Rfl:  .  blood glucose meter kit and supplies, Dispense based on patient and insurance preference. Use up to four times daily as directed. (FOR ICD-10:  E11.22). Check BG daily, Disp: 1 each, Rfl: 0 .  carvedilol (COREG) 25 MG tablet, TAKE 1 TABLET BY MOUTH TWICE DAILY WITH A MEAL (Patient taking differently: Take 25 mg by mouth 2 (two) times daily with a meal. TAKE 1 TABLET BY MOUTH TWICE DAILY WITH A MEAL), Disp: 180 tablet, Rfl: 3 .  clopidogrel (PLAVIX) 75 MG tablet, take 300 mg (4 tablets) on Day 1, then take 75 mg (1 tablet) daily., Disp: 94 tablet, Rfl: 3 .  fenofibrate (TRICOR) 48 MG tablet, TAKE 1 TABLET BY MOUTH EVERY DAY (Patient taking differently: Take 48 mg by mouth daily. ), Disp: 90 tablet, Rfl: 0 .  FLUoxetine (PROZAC) 40 MG capsule, Take 1 capsule (40 mg total) by mouth daily. (Needs to be seen before next refill), Disp: 30 capsule, Rfl: 0 .  icosapent Ethyl (VASCEPA) 1 g capsule, Take 2 capsules (2 g total) by mouth 2 (two) times daily., Disp: 120 capsule, Rfl: 11 .  Insulin Pen Needle (GLOBAL EASE INJECT PEN NEEDLES) 31G X 5 MM MISC, USE ONCE EVERY DAY, Disp: 100 each, Rfl: 3 .  losartan (COZAAR) 25 MG tablet, Take 1  tablet (25 mg total) by mouth daily., Disp: 90 tablet, Rfl: 3 .  medroxyPROGESTERone (PROVERA) 5 MG tablet, TAKE 1 TABLET BY MOUTH DAILY (Patient taking differently: Take 5 mg by mouth daily. ), Disp: 90 tablet, Rfl: 0 .  metFORMIN (GLUCOPHAGE) 1000 MG tablet, TAKE 1 TABLET BY MOUTH TWICE DAILY (Patient taking differently: Take 1,000 mg by mouth 2 (two) times daily with a meal. ), Disp: 60 tablet, Rfl: 5 .  nitroGLYCERIN (NITROSTAT) 0.4 MG SL tablet, DISSOLVE 1 TABLET UNDER THE TONGUE EVERY 5 MINUTES AS NEEDED FOR CHEST PAIN. DO NOT EXCEED A TOTAL OF 3 DOSES IN 15 MINUTES. (Patient taking differently: Place 0.4 mg under the tongue every 5 (five) minutes as needed for chest pain. ), Disp: 25 tablet, Rfl: 3 .  potassium chloride SA (KLOR-CON) 20 MEQ tablet, TAKE 1 TABLET BY MOUTH EVERY DAY (Patient taking differently: Take 20 mEq by mouth every other day. ), Disp: 90 tablet, Rfl: 3 .  pregabalin (LYRICA) 100 MG capsule, TAKE ONE CAPSULE BY MOUTH TWICE DAILY (Patient taking differently: Take 100 mg by mouth 2 (two) times daily. ), Disp: 60 capsule, Rfl: 5 .  ranolazine (RANEXA) 1000 MG SR tablet, Take 1 tablet (1,000 mg total) by mouth 2 (two) times daily., Disp: 180 tablet, Rfl: 3 .  rosuvastatin (CRESTOR) 40 MG tablet, TAKE 1 TABLET BY MOUTH EVERY DAY (Patient taking differently: TAKE  1 TABLET BY MOUTH EVERY DAY), Disp: 90 tablet, Rfl: 3 .  torsemide (DEMADEX) 20 MG tablet, Take 40 mg by mouth daily as needed (swelling). , Disp: , Rfl:  .  varenicline (CHANTIX STARTING MONTH PAK) 0.5 MG X 11 & 1 MG X 42 tablet, Take one 0.5 mg tablet by mouth once daily for 3 days, then increase to one 0.5 mg tablet twice daily for 4 days, then increase to one 1 mg tablet twice daily. (Patient not taking: Reported on 03/19/2020), Disp: 53 tablet, Rfl: 0 .  VICTOZA 18 MG/3ML SOPN, INJECT 1.2 MG INTO SKIN EVERY DAY, Disp: 3 mL, Rfl: 0  Past Medical History: Past Medical History:  Diagnosis Date  . Allergy   . Anemia   .  Anxiety   . CHF (congestive heart failure) ([place])   . Chronic kidney disease    history of kidney stones  . Coronary artery disease 09/2011   a.s/p BMS to LAD and angioplasty of RI in 09/2011 b. patent stent by cath in 02/2012, 12/2013, and 02/2016 with most recent showing 30-40% dLAD and 30-40% RCA stenosis  . Depression   . Diabetes mellitus   . Hyperlipidemia   . Hypertension   . Midsternal chest pain 03/03/2012  . Migraines   . Morbid obesity ([place])   . Myocardial infarct ([place]) 09/2011  . Myocardial infarction ([place]) 02/2016  . Palpitations 02/17/2012  . Sleep apnea   . Ureteral stone with hydronephrosis   . Urinary tract obstruction due to kidney stone 02/15/2017    Tobacco Use: Social History   Tobacco Use  Smoking Status Current Every Day Smoker  . Packs/day: 0.50  . Years: 20.00  . Pack years: 10.00  . Types: Cigarettes  . Start date: 03/18/1990  Smokeless Tobacco Never Used    Labs: Recent Review Flowsheet Data    Labs for ITP Cardiac and Pulmonary Rehab Latest Ref Rng & Units 10/04/2018 06/05/2019 10/09/2019 01/13/2020 01/14/2020   Cholestrol 0 - 200 mg/dL - 213(H) 127 - 107   LDLCALC 0 - 99 mg/dL - 141(H) 66 - 59   HDL >40 mg/dL - 30(L) 32(L) - 27(L)   Trlycerides <150 mg/dL - 232(H) 171(H) - 105   Hemoglobin A1c 4.8 - 5.6 % 6.6 7.3(H) 6.1 6.1(H) -   TCO2 0 - 100 mmol/L - - - - -      Capillary Blood Glucose: Lab Results  Component Value Date   GLUCAP 112 (H) 03/19/2020   GLUCAP 222 (H) 01/15/2020   GLUCAP 109 (H) 01/15/2020   GLUCAP 93 01/14/2020   GLUCAP 113 (H) 01/14/2020    POCT Glucose    Row Name 03/24/20 1120             POCT Blood Glucose   Pre-Exercise 119 mg/dL              Exercise Target Goals: Exercise Program Goal: Individual exercise prescription set using results from initial 6 min walk test and THRR while considering  patient's activity barriers and safety.   Exercise Prescription Goal: Starting with aerobic activity 30 plus  minutes a day, 3 days per week for initial exercise prescription. Provide home exercise prescription and guidelines that participant acknowledges understanding prior to discharge.  Activity Barriers & Risk Stratification:  Activity Barriers & Cardiac Risk Stratification - 03/19/20 1407      Activity Barriers & Cardiac Risk Stratification   Activity Barriers Deconditioning;Shortness of Breath;Other (comment)    Comments Diabetic neuropathy in both  legs    Cardiac Risk Stratification High           6 Minute Walk:  6 Minute Walk    Row Name 03/19/20 1352         6 Minute Walk   Phase Initial     Distance 600 feet     Walk Time 6 minutes     # of Rest Breaks 1     MPH 1.13     METS 1.57     RPE 13     Perceived Dyspnea  15     VO2 Peak 5.52     Symptoms Yes (comment)     Comments Took one seated rest break due to SOB and L leg fatigue     Resting HR 94 bpm     Resting BP 90/60     Resting Oxygen Saturation  98 %     Exercise Oxygen Saturation  during 6 min walk 97 %     Max Ex. HR 114 bpm     Max Ex. BP 112/64     2 Minute Post BP 98/66            Oxygen Initial Assessment:   Oxygen Re-Evaluation:   Oxygen Discharge (Final Oxygen Re-Evaluation):   Initial Exercise Prescription:  Initial Exercise Prescription - 03/19/20 1300      Date of Initial Exercise RX and Referring Provider   Date 03/19/20    Referring Provider Dr. Harl Bowie    Expected Discharge Date 06/19/20      Arm Ergometer   Level 1    Watts 42.3    Minutes 22      T5 Nustep   Level 1    SPM 80    Minutes 17      Prescription Details   Frequency (times per week) 3    Duration Progress to 30 minutes of continuous aerobic without signs/symptoms of physical distress      Intensity   THRR 40-80% of Max Heartrate [id number]    Ratings of Perceived Exertion 11-13    Perceived Dyspnea 0-4      Progression   Progression Continue progressive overload as per policy without signs/symptoms or  physical distress.      Resistance Training   Training Prescription Yes    Weight 1    Reps 10-15           Perform Capillary Blood Glucose checks as needed.  Exercise Prescription Changes:   Exercise Prescription Changes    Row Name 03/24/20 1100             Response to Exercise   Blood Pressure (Admit) 104/60       Blood Pressure (Exercise) 112/64       Blood Pressure (Exit) 112/74       Heart Rate (Admit) 90 bpm       Heart Rate (Exercise) 110 bpm       Heart Rate (Exit) 93 bpm       Rating of Perceived Exertion (Exercise) 13       Duration Continue with 30 min of aerobic exercise without signs/symptoms of physical distress.       Intensity THRR unchanged         Progression   Progression Continue to progress workloads to maintain intensity without signs/symptoms of physical distress.         Resistance Training   Training Prescription Yes       Weight 1  Reps 10-15         Arm Ergometer   Level 1       Watts 13       Minutes 17       METs 1.8         T5 Nustep   Level 1       SPM 96       Minutes 22       METs 3.1              Exercise Comments:   Exercise Goals and Review:   Exercise Goals    Row Name 03/19/20 1435             Exercise Goals   Increase Physical Activity Yes       Intervention Provide advice, education, support and counseling about physical activity/exercise needs.;Develop an individualized exercise prescription for aerobic and resistive training based on initial evaluation findings, risk stratification, comorbidities and participant's personal goals.       Expected Outcomes Short Term: Attend rehab on a regular basis to increase amount of physical activity.;Long Term: Add in home exercise to make exercise part of routine and to increase amount of physical activity.;Long Term: Exercising regularly at least 3-5 days a week.       Increase Strength and Stamina Yes       Intervention Provide advice, education, support  and counseling about physical activity/exercise needs.;Develop an individualized exercise prescription for aerobic and resistive training based on initial evaluation findings, risk stratification, comorbidities and participant's personal goals.       Expected Outcomes Short Term: Increase workloads from initial exercise prescription for resistance, speed, and METs.;Short Term: Perform resistance training exercises routinely during rehab and add in resistance training at home;Long Term: Improve cardiorespiratory fitness, muscular endurance and strength as measured by increased METs and functional capacity (6MWT)       Able to understand and use rate of perceived exertion (RPE) scale Yes       Intervention Provide education and explanation on how to use RPE scale       Expected Outcomes Short Term: Able to use RPE daily in rehab to express subjective intensity level;Long Term:  Able to use RPE to guide intensity level when exercising independently       Able to understand and use Dyspnea scale Yes       Intervention Provide education and explanation on how to use Dyspnea scale       Expected Outcomes Short Term: Able to use Dyspnea scale daily in rehab to express subjective sense of shortness of breath during exertion;Long Term: Able to use Dyspnea scale to guide intensity level when exercising independently       Knowledge and understanding of Target Heart Rate Range (THRR) Yes       Intervention Provide education and explanation of THRR including how the numbers were predicted and where they are located for reference       Expected Outcomes Short Term: Able to state/look up THRR;Short Term: Able to use daily as guideline for intensity in rehab;Long Term: Able to use THRR to govern intensity when exercising independently       Able to check pulse independently Yes       Intervention Provide education and demonstration on how to check pulse in carotid and radial arteries.;Review the importance of being  able to check your own pulse for safety during independent exercise       Expected Outcomes Short Term:  Able to explain why pulse checking is important during independent exercise;Long Term: Able to check pulse independently and accurately       Understanding of Exercise Prescription Yes       Intervention Provide education, explanation, and written materials on patient's individual exercise prescription       Expected Outcomes Short Term: Able to explain program exercise prescription              Exercise Goals Re-Evaluation :    Discharge Exercise Prescription (Final Exercise Prescription Changes):  Exercise Prescription Changes - 03/24/20 1100      Response to Exercise   Blood Pressure (Admit) 104/60    Blood Pressure (Exercise) 112/64    Blood Pressure (Exit) 112/74    Heart Rate (Admit) 90 bpm    Heart Rate (Exercise) 110 bpm    Heart Rate (Exit) 93 bpm    Rating of Perceived Exertion (Exercise) 13    Duration Continue with 30 min of aerobic exercise without signs/symptoms of physical distress.    Intensity THRR unchanged      Progression   Progression Continue to progress workloads to maintain intensity without signs/symptoms of physical distress.      Resistance Training   Training Prescription Yes    Weight 1    Reps 10-15      Arm Ergometer   Level 1    Watts 13    Minutes 17    METs 1.8      T5 Nustep   Level 1    SPM 96    Minutes 22    METs 3.1           Nutrition:  Target Goals: Understanding of nutrition guidelines, daily intake of sodium <1535m, cholesterol <2057m calories 30% from fat and 7% or less from saturated fats, daily to have 5 or more servings of fruits and vegetables.  Biometrics:  Pre Biometrics - 03/19/20 1433      Pre Biometrics   Height 5' 10"  (1.778 m)    Weight 172.6 kg    Waist Circumference 64 inches    Hip Circumference 62 inches    Waist to Hip Ratio 1.03 %    BMI (Calculated) 54.61    Triceps Skinfold 32 mm    %  Body Fat 61.1 %    Grip Strength 27.3 kg    Flexibility 0 in    Single Leg Stand 4.47 seconds            Nutrition Therapy Plan and Nutrition Goals:  Nutrition Therapy & Goals - 04/02/20 0825      Personal Nutrition Goals   Comments Patient's medificts diet assessment score was 3. She says she met with an RD 2 years ago but would like to attend the RD class. She says she follows a diabetic heart healthy diet. Will continue to monitor.      Intervention Plan   Intervention Nutrition handout(s) given to patient.           Nutrition Assessments:  Nutrition Assessments - 03/19/20 1448      MEDFICTS Scores   Pre Score 3           Nutrition Goals Re-Evaluation:   Nutrition Goals Discharge (Final Nutrition Goals Re-Evaluation):   Psychosocial: Target Goals: Acknowledge presence or absence of significant depression and/or stress, maximize coping skills, provide positive support system. Participant is able to verbalize types and ability to use techniques and skills needed for reducing stress and depression.  Initial  Review & Psychosocial Screening:  Initial Psych Review & Screening - 03/19/20 1443      Initial Review   Current issues with Current Depression;History of Depression;Current Anxiety/Panic      Family Dynamics   Good Support System? Yes    Comments Patient reports having good family support.      Barriers   Psychosocial barriers to participate in program Psychosocial barriers identified (see note)      Screening Interventions   Interventions Encouraged to exercise;Provide feedback about the scores to participant;To provide support and resources with identified psychosocial needs    Expected Outcomes Long Term Goal: Stressors or current issues are controlled or eliminated.;Short Term goal: Identification and review with participant of any Quality of Life or Depression concerns found by scoring the questionnaire.;Long Term goal: The participant improves  quality of Life and PHQ9 Scores as seen by post scores and/or verbalization of changes           Quality of Life Scores:  Quality of Life - 03/19/20 1315      Quality of Life   Select Quality of Life      Quality of Life Scores   Health/Function Pre 18 %    Socioeconomic Pre 22.75 %    Psych/Spiritual Pre 22.5 %    Family Pre 30 %    GLOBAL Pre 21.7 %          Scores of 19 and below usually indicate a poorer quality of life in these areas.  A difference of  2-3 points is a clinically meaningful difference.  A difference of 2-3 points in the total score of the Quality of Life Index has been associated with significant improvement in overall quality of life, self-image, physical symptoms, and general health in studies assessing change in quality of life.  PHQ-9: Recent Review Flowsheet Data    Depression screen [facility] 2/9 03/19/2020 10/09/2019 06/05/2019 10/04/2018 07/17/2018   Decreased Interest 1 0 0 0 0   Down, Depressed, Hopeless 0 1 0 0 0   PHQ - 2 Score 1 1 0 0 0   Altered sleeping 2 1 0 0 -   Tired, decreased energy 1 1 0 0 -   Change in appetite 1 0 0 0 -   Feeling bad or failure about yourself  0 0 0 0 -   Trouble concentrating 1 0 0 0 -   Moving slowly or fidgety/restless 0 0 0 0 -   Suicidal thoughts 0 0 0 0 -   PHQ-9 Score 6 3 0 0 -   Difficult doing work/chores Not difficult at all Not difficult at all - Not difficult at all -     Interpretation of Total Score  Total Score Depression Severity:  1-4 = Minimal depression, 5-9 = Mild depression, 10-14 = Moderate depression, 15-19 = Moderately severe depression, 20-27 = Severe depression   Psychosocial Evaluation and Intervention:  Psychosocial Evaluation - 03/19/20 1444      Psychosocial Evaluation & Interventions   Interventions Encouraged to exercise with the program and follow exercise prescription;Stress management education;Relaxation education    Comments Patient's initial QOL score was 21.7 % and her PHQ-9  score was 6. She is currently on Fluoxetine 40 mg daily to treat Depression and Anxiety. She says her depression is mainly due to her health and obesity and she gets anxious in crowds. She feels her depression and anxiety are managed on Fluoxetine. Will continue to monitor for progress.    Expected  Outcomes Patient's depression and anxiety will be managed at discharge with no additional psychosocial issues identified.    Continue Psychosocial Services  Follow up required by staff           Psychosocial Re-Evaluation:   Psychosocial Discharge (Final Psychosocial Re-Evaluation):   Vocational Rehabilitation: Provide vocational rehab assistance to qualifying candidates.   Vocational Rehab Evaluation & Intervention:  Vocational Rehab - 03/19/20 1448      Initial Vocational Rehab Evaluation & Intervention   Assessment shows need for Vocational Rehabilitation No      Vocational Rehab Re-Evaulation   Comments Patient is unemployed and is currently seeking to get on disablilty.           Education: Education Goals: Education classes will be provided on a weekly basis, covering required topics. Participant will state understanding/return demonstration of topics presented.  Learning Barriers/Preferences:  Learning Barriers/Preferences - 03/19/20 1448      Learning Barriers/Preferences   Learning Barriers None    Learning Preferences Skilled Demonstration           Education Topics: Hypertension, Hypertension Reduction -Define heart disease and high blood pressure. Discus how high blood pressure affects the body and ways to reduce high blood pressure.   Exercise and Your Heart -Discuss why it is important to exercise, the FITT principles of exercise, normal and abnormal responses to exercise, and how to exercise safely.   Angina -Discuss definition of angina, causes of angina, treatment of angina, and how to decrease risk of having angina.   Cardiac Medications -Review  what the following cardiac medications are used for, how they affect the body, and side effects that may occur when taking the medications.  Medications include Aspirin, Beta blockers, calcium channel blockers, ACE Inhibitors, angiotensin receptor blockers, diuretics, digoxin, and antihyperlipidemics.   Congestive Heart Failure -Discuss the definition of CHF, how to live with CHF, the signs and symptoms of CHF, and how keep track of weight and sodium intake.   Heart Disease and Intimacy -Discus the effect sexual activity has on the heart, how changes occur during intimacy as we age, and safety during sexual activity.   Smoking Cessation / COPD -Discuss different methods to quit smoking, the health benefits of quitting smoking, and the definition of COPD.   Nutrition I: Fats -Discuss the types of cholesterol, what cholesterol does to the heart, and how cholesterol levels can be controlled.   Nutrition II: Labels -Discuss the different components of food labels and how to read food label   Heart Parts/Heart Disease and PAD -Discuss the anatomy of the heart, the pathway of blood circulation through the heart, and these are affected by heart disease.   Stress I: Signs and Symptoms -Discuss the causes of stress, how stress may lead to anxiety and depression, and ways to limit stress.   Stress II: Relaxation -Discuss different types of relaxation techniques to limit stress.   Warning Signs of Stroke / TIA -Discuss definition of a stroke, what the signs and symptoms are of a stroke, and how to identify when someone is having stroke.   CARDIAC REHAB PHASE II EXERCISE from 03/26/2020 in [place]  Date 03/26/20  Educator Etheleen Mayhew  Instruction Review Code 1- Verbalizes Understanding      Knowledge Questionnaire Score:  Knowledge Questionnaire Score - 03/19/20 1448      Knowledge Questionnaire Score   Pre Score 26/28           Core Components/Risk  Factors/Patient Goals at  Admission:  Personal Goals and Risk Factors at Admission - 03/19/20 1449      Core Components/Risk Factors/Patient Goals on Admission    Weight Management Obesity;Yes    Intervention Weight Management: Develop a combined nutrition and exercise program designed to reach desired caloric intake, while maintaining appropriate intake of nutrient and fiber, sodium and fats, and appropriate energy expenditure required for the weight goal.;Weight Management: Provide education and appropriate resources to help participant work on and attain dietary goals.;Weight Management/Obesity: Establish reasonable short term and long term weight goals.;Obesity: Provide education and appropriate resources to help participant work on and attain dietary goals.    Admit Weight 280 lb 9.6 oz (127.3 kg)    Goal Weight: Short Term 273 lb 1.6 oz (123.9 kg)    Goal Weight: Long Term 265 lb 9.6 oz (120.5 kg)    Expected Outcomes Weight Maintenance: Understanding of the daily nutrition guidelines, which includes 25-35% calories from fat, 7% or less cal from saturated fats, less than 218m cholesterol, less than 1.5gm of sodium, & 5 or more servings of fruits and vegetables daily;Short Term: Continue to assess and modify interventions until short term weight is achieved    Tobacco Cessation Yes    Number of packs per day 6-7 cigerattes/day    Intervention Assist the participant in steps to quit. Provide individualized education and counseling about committing to Tobacco Cessation, relapse prevention, and pharmacological support that can be provided by physician.;OAdvice worker assist with locating and accessing local/national Quit Smoking programs, and support quit date choice.    Expected Outcomes Short Term: Will quit all tobacco product use, adhering to prevention of relapse plan.    Diabetes Yes    Intervention Provide education about signs/symptoms and action to take for  hypo/hyperglycemia.;Provide education about proper nutrition, including hydration, and aerobic/resistive exercise prescription along with prescribed medications to achieve blood glucose in normal ranges: Fasting glucose 65-99 mg/dL    Expected Outcomes Short Term: Participant verbalizes understanding of the signs/symptoms and immediate care of hyper/hypoglycemia, proper foot care and importance of medication, aerobic/resistive exercise and nutrition plan for blood glucose control.    Personal Goal Other Yes    Personal Goal Lose 15 lbs in program and continue to lose weight. Be able to walk 50 feet without getting SOB or fatigued.    Intervention Patient will participate in CR 3 days/week and supplement with exercise at home 2 days/week.    Expected Outcomes Patient will complete the program and meet both program and personal goals.           Core Components/Risk Factors/Patient Goals Review:   Goals and Risk Factor Review    Row Name 04/02/20 0826             Core Components/Risk Factors/Patient Goals Review   Personal Goals Review Tobacco Cessation;Weight Management/Obesity;Other  Lose 15 lbs. Walk 50 ft without having to stop for SOB or fatigue.       Review Patient is new to the program completing 3 sessions. Will continue to monitor as she works toward meeting both program and personal goals.       Expected Outcomes Patient will continue to attend sessions and complete the program meeting both program and personal goals.              Core Components/Risk Factors/Patient Goals at Discharge (Final Review):   Goals and Risk Factor Review - 04/02/20 0826      Core Components/Risk Factors/Patient Goals Review   Personal  Goals Review Tobacco Cessation;Weight Management/Obesity;Other   Lose 15 lbs. Walk 50 ft without having to stop for SOB or fatigue.   Review Patient is new to the program completing 3 sessions. Will continue to monitor as she works toward meeting both program and  personal goals.    Expected Outcomes Patient will continue to attend sessions and complete the program meeting both program and personal goals.           ITP Comments:   Comments: ITP REVIEW Pt is making expected progress toward Cardiac Rehab goals after completing 3 sessions. Recommend continued exercise, life style modification, education, and increased stamina and strength.

## 2020-04-04 ENCOUNTER — Encounter (HOSPITAL_COMMUNITY): Payer: Medicaid Other

## 2020-04-07 ENCOUNTER — Encounter (HOSPITAL_COMMUNITY): Payer: Medicaid Other

## 2020-04-07 ENCOUNTER — Other Ambulatory Visit: Payer: Self-pay

## 2020-04-07 ENCOUNTER — Encounter: Payer: Self-pay | Admitting: Cardiology

## 2020-04-07 ENCOUNTER — Ambulatory Visit (INDEPENDENT_AMBULATORY_CARE_PROVIDER_SITE_OTHER): Payer: Medicaid Other | Admitting: Cardiology

## 2020-04-07 VITALS — BP 74/46 | HR 85 | Ht 70.0 in | Wt 378.0 lb

## 2020-04-07 DIAGNOSIS — I2510 Atherosclerotic heart disease of native coronary artery without angina pectoris: Secondary | ICD-10-CM | POA: Diagnosis not present

## 2020-04-07 DIAGNOSIS — I10 Essential (primary) hypertension: Secondary | ICD-10-CM

## 2020-04-07 DIAGNOSIS — I5032 Chronic diastolic (congestive) heart failure: Secondary | ICD-10-CM | POA: Diagnosis not present

## 2020-04-07 DIAGNOSIS — E782 Mixed hyperlipidemia: Secondary | ICD-10-CM

## 2020-04-07 NOTE — Progress Notes (Signed)
Clinical Summary [name] is a 39 y.o.female seen today for follow up of the following medical problems.   1. CAD - prior anterior MI 09/2011, 95% lesion in mid LAD and ramus with 95% ostial stenosis. LVEF 55% by LV gram, apical hypokinesis. BMS to mid LAD and cutting balloon angioplasty of ramus.  - repeat cath 02/2012 with patent vessels and stent - 09/2011 Echo: LVEF 50% - 12/2013 Lexiscan large anteroseptal ischemia - cath 12/2013 with patent vessels - cath 02/2016 [facility] in setting of NSTEMI showed LM patent, LAD patent with patent stent, distal LAD 30-40%, LCX patent, RCA 30-40%. LVEDP 40, PCWP 30 mean PA 32, CI 4.93 - echo 02/2016 [place] LVEF 50%, apical akinesis    - 07/2018 cath mild to moderate disease other than small D1 99% too small for PCI. Normal LVEDP - we tried imdur 47m daily. Reported some episodes of shaking on this medication. Changed to ranexa   12/2019 admitted with NSTEMI - cath with LM patent, ostial LAD 40%, D1 99%, ramus ostial 90%, LCX patent, RCA prox 50%. Received DES to ramus  - participating in cardiac rehab.  - compliant with meds - no recent chest pains.  - plans for extended plavix   2. Chronic diastolic HF  - no recent edema - has torsemide prn  3. HTN - compliant with meds  4. Hyperlipidemia  12/2019 TC 107 TG 105 HDL 27 LDL 59 - compliant with meds Past Medical History:  Diagnosis Date  . Allergy   . Anemia   . Anxiety   . [place]ive heart [place]   . Chronic kidney disease    history of kidney stones  . Coronary artery disease 09/2011   a.s/p BMS to LAD and angioplasty of RI in 09/2011 b. patent stent by cath in 02/2012, 12/2013, and 02/2016 with most recent showing 30-40% dLAD and 30-40% RCA stenosis  . Depression   . Diabetes mellitus   . Hyperlipidemia   . Hypertension   . Midsternal chest pain 03/03/2012  . Migraines   . [place]bid [place] ([place]   . [place]ocardial infar[place] 09/[place]  . Myocardial  infarction ([place] 02/2016  . Palpitations 02/17/2012  . Sleep apnea   . Ureteral stone with hydronephrosis   . Urinary tract obstruction due to kidney stone 02/15/2017     Allergies  Allergen Reactions  . Bee Venom Anaphylaxis and Swelling  . Latex Itching  . Sulfa Drugs Cross Reactors Nausea And Vomiting     Current Outpatient Medications  Medication Sig Dispense Refill  . ACCU-CHEK AVIVA PLUS test strip USE TO TEST BLOOD SUGAR FOUR TIMES DAILY 400 strip 3  . aspirin 81 MG chewable tablet Chew 81 mg by mouth daily.    . blood glucose meter kit and supplies Dispense based on patient and insurance preference. Use up to four times daily as directed. (FOR ICD-10:  E11.22). Check BG daily 1 each 0  . carvedilol (COREG) 25 MG tablet TAKE 1 TABLET BY MOUTH TWICE DAILY WITH A MEAL (Patient taking differently: Take 25 mg by mouth 2 (two) times daily with a meal. TAKE 1 TABLET BY MOUTH TWICE DAILY WITH A MEAL) 180 tablet 3  . clopidogrel (PLAVIX) 75 MG tablet take 300 mg (4 tablets) on Day 1, then take 75 mg (1 tablet) daily. 94 tablet 3  . fenofibrate (TRICOR) 48 MG tablet TAKE 1 TABLET BY MOUTH EVERY DAY (Patient taking differently: Take 48 mg by mouth daily. )  90 tablet 0  . FLUoxetine (PROZAC) 40 MG capsule Take 1 capsule (40 mg total) by mouth daily. (Needs to be seen before next refill) 30 capsule 0  . icosapent Ethyl (VASCEPA) 1 g capsule Take 2 capsules (2 g total) by mouth 2 (two) times daily. 120 capsule 11  . Insulin Pen Needle (GLOBAL EASE INJECT PEN NEEDLES) 31G X 5 MM MISC USE ONCE EVERY DAY 100 each 3  . losartan (COZAAR) 25 MG tablet Take 1 tablet (25 mg total) by mouth daily. 90 tablet 3  . medroxyPROGESTERone (PROVERA) 5 MG tablet TAKE 1 TABLET BY MOUTH DAILY (Patient taking differently: Take 5 mg by mouth daily. ) 90 tablet 0  . metFORMIN (GLUCOPHAGE) 1000 MG tablet TAKE 1 TABLET BY MOUTH TWICE DAILY (Patient taking differently: Take 1,000 mg by mouth 2 (two) times daily with a  meal. ) 60 tablet 5  . nitroGLYCERIN (NITROSTAT) 0.4 MG SL tablet DISSOLVE 1 TABLET UNDER THE TONGUE EVERY 5 MINUTES AS NEEDED FOR CHEST PAIN. DO NOT EXCEED A TOTAL OF 3 DOSES IN 15 MINUTES. (Patient taking differently: Place 0.4 mg under the tongue every 5 (five) minutes as needed for chest pain. ) 25 tablet 3  . potassium chloride SA (KLOR-CON) 20 MEQ tablet TAKE 1 TABLET BY MOUTH EVERY DAY (Patient taking differently: Take 20 mEq by mouth every other day. ) 90 tablet 3  . pregabalin (LYRICA) 100 MG capsule TAKE ONE CAPSULE BY MOUTH TWICE DAILY (Patient taking differently: Take 100 mg by mouth 2 (two) times daily. ) 60 capsule 5  . ranolazine (RANEXA) 1000 MG SR tablet Take 1 tablet (1,000 mg total) by mouth 2 (two) times daily. 180 tablet 3  . rosuvastatin (CRESTOR) 40 MG tablet TAKE 1 TABLET BY MOUTH EVERY DAY (Patient taking differently: TAKE 1 TABLET BY MOUTH EVERY DAY) 90 tablet 3  . torsemide (DEMADEX) 20 MG tablet Take 40 mg by mouth daily as needed (swelling).     . varenicline (CHANTIX STARTING MONTH PAK) 0.5 MG X 11 & 1 MG X 42 tablet Take one 0.5 mg tablet by mouth once daily for 3 days, then increase to one 0.5 mg tablet twice daily for 4 days, then increase to one 1 mg tablet twice daily. (Patient not taking: Reported on 03/19/2020) 53 tablet 0  . VICTOZA 18 MG/3ML SOPN INJECT 1.2 MG INTO SKIN EVERY DAY 3 mL 0   No current facility-administered medications for this visit.     Past Surgical History:  Procedure Laterality Date  . CARDIAC CATHETERIZATION  10/08/2011   LAD: 95% mid, Ramus: 95% ostial  . CARPAL TUNNEL RELEASE    . CESAREAN SECTION    . CHOLECYSTECTOMY    . CORONARY ANGIOPLASTY WITH STENT PLACEMENT  10/08/2011   LAD: BMS, Ramus: cutting balloon angioplasty  . CORONARY STENT INTERVENTION N/A 01/14/2020   Procedure: CORONARY STENT INTERVENTION;  Surgeon: Leonie Man, MD;  Location: [place] [facility];  Service: Cardiovascular;  Laterality: N/A;  . CYSTOSCOPY WITH  RETROGRADE PYELOGRAM, URETEROSCOPY AND STENT PLACEMENT Left 02/16/2017   Procedure: CYSTOSCOPY WITH RETROGRADE PYELOGRAM, URETEROSCOPY AND STENT PLACEMENT;  Surgeon: Kathie Rhodes, MD;  Location: [facility];  Service: Urology;  Laterality: Left;  . CYSTOSCOPY WITH RETROGRADE PYELOGRAM, URETEROSCOPY AND STENT PLACEMENT Left 03/28/2017   Procedure: LEFT STENT REMOVAL LEFT RETROGRADE PYELOGRAM LEFT URETEROSCOPY   LASER LITHOTRIPSY  AND  STENT REPLACEMENT;  Surgeon: Cleon Gustin, MD;  Location: [facility];  Service: Urology;  Laterality: Left;  .  LEFT HEART CATH AND CORONARY ANGIOGRAPHY N/A 08/15/2018   Procedure: LEFT HEART CATH AND CORONARY ANGIOGRAPHY;  Surgeon: Martinique, Peter M, MD;  Location: [place] [facility];  Service: Cardiovascular;  Laterality: N/A;  . LEFT HEART CATH AND CORONARY ANGIOGRAPHY N/A 01/14/2020   Procedure: LEFT HEART CATH AND CORONARY ANGIOGRAPHY;  Surgeon: Leonie Man, MD;  Location: [place] [facility];  Service: Cardiovascular;  Laterality: N/A;  . LEFT HEART CATHETERIZATION WITH CORONARY ANGIOGRAM N/A 10/08/2011   Procedure: LEFT HEART CATHETERIZATION WITH CORONARY ANGIOGRAM;  Surgeon: Peter M Martinique, MD;  Location: [facility] CATH LAB;  Service: Cardiovascular;  Laterality: N/A;  . LEFT HEART CATHETERIZATION WITH CORONARY ANGIOGRAM N/A 03/02/2012   Procedure: LEFT HEART CATHETERIZATION WITH CORONARY ANGIOGRAM;  Surgeon: Burnell Blanks, MD;  Location: [facility] CATH LAB;  Service: Cardiovascular;  Laterality: N/A;  . LEFT HEART CATHETERIZATION WITH CORONARY ANGIOGRAM N/A 01/04/2014   Procedure: LEFT HEART CATHETERIZATION WITH CORONARY ANGIOGRAM;  Surgeon: Troy Sine, MD;  Location: [facility] CATH LAB;  Service: Cardiovascular;  Laterality: N/A;     Allergies  Allergen Reactions  . Bee Venom Anaphylaxis and Swelling  . Latex Itching  . Sulfa Drugs Cross Reactors Nausea And Vomiting      Family History  Problem Relation Age of Onset  . Heart murmur Father   . Diabetes Father   .  Hypertension Father   . Hyperlipidemia Father   . Cancer Father        throat  . Diabetes Mother   . Cancer Mother        breast.uterine, ovarian  . Asthma Son   . Appendicitis Son   . Hernia Son   . Mental illness Son        Bipolar, personality d/o  . Stroke Maternal Grandmother   . Cancer Maternal Grandmother        breast  . Cancer Paternal Grandfather        lung     Social History [name] reports that she has been smoking cigarettes. She started smoking about 30 years ago. She has a 10.00 pack-year smoking history. She has never used smokeless tobacco. [name] reports current alcohol use.   Review of Systems CONSTITUTIONAL: No weight loss, fever, chills, weakness or fatigue.  HEENT: Eyes: No visual loss, blurred vision, double vision or yellow sclerae.No hearing loss, sneezing, congestion, runny nose or sore throat.  SKIN: No rash or itching.  CARDIOVASCULAR: per hpi RESPIRATORY: No shortness of breath, cough or sputum.  GASTROINTESTINAL: No anorexia, nausea, vomiting or diarrhea. No abdominal pain or blood.  GENITOURINARY: No burning on urination, no polyuria NEUROLOGICAL: No headache, dizziness, syncope, paralysis, ataxia, numbness or tingling in the extremities. No change in bowel or bladder control.  MUSCULOSKELETAL: No muscle, back pain, joint pain or stiffness.  LYMPHATICS: No enlarged nodes. No history of splenectomy.  PSYCHIATRIC: No history of depression or anxiety.  ENDOCRINOLOGIC: No reports of sweating, cold or heat intolerance. No polyuria or polydipsia.  Marland Kitchen   Physical Examination Today's Vitals   04/07/20 0834  BP: (!) 74/46  Pulse: 85  SpO2: 98%  Weight: (!) 378 lb (171.5 kg)  Height: 5' 10"  (1.778 m)   Body mass index is 54.24 kg/m.  Gen: resting comfortably, no acute distress HEENT: no scleral icterus, pupils equal round and reactive, no palptable cervical adenopathy,  CV: RRR, no /r/g, nojvd Resp: Clear to auscultation  bilaterally GI: abdomen is soft, non-tender, non-distended, normal bowel sounds, no hepatosplenomegaly MSK: extremities  are warm, no edema.  Skin: warm, no rash Neuro:  no focal deficits Psych: appropriate affect   Diagnostic Studies 02/2012 Cath Hemodynamic Findings: Central aortic pressure: 108/70  Left ventricular pressure: 123/10/21  Angiographic Findings: Left main: No obstructive disease noted.  Left Anterior Descending Artery: Large caliber vessel that courses to the apex. There is a stent present in the mid vessel that is widely patent with no restenosis. The remainder of the LAD is disease free. There is a moderate sized diagonal Tris Howell with 30% proximal stenosis.  Circumflex Artery: Dominant, large caliber vessel with no disease throughout the AV groove segment. There is small to moderate sized intermediate Frantz Quattrone that has ostial 30% stenosis. The remainder of the Circumflex has no obstructive disease.  Right Coronary Artery: Small, non-dominant vessel. No disease noted.  Left Ventricular Angiogram: LVEF 50-55%.  Impression:  1. Double vessel CAD with patent stent mid LAD and patent angioplasty site at ostium of intermediate Donavin Audino.  2. Normal LV systolic function   11/2547 Cath PROCEDURAL FINDINGS  Hemodynamics:  AO 135/92 with a mean of 114 mmHg  LV 136/25 mmHg  Coronary angiography:  Coronary dominance: Left  Left mainstem: Normal.  Left anterior descending (LAD): There is a 95% stenosis in the mid LAD immediately after the takeoff of the first diagonal. The first diagonal Candas Deemer is moderate in size and appears normal. The LAD stenosis does appear to be hazy.  There is a moderately large ramus intermediate Elida Harbin which has a 95% ostial stenosis.  Left circumflex (LCx): This is a dominant vessel and appears normal throughout.  Right coronary artery (RCA): This is a small nondominant vessel and is normal.  Left ventriculography: Left ventricular  systolic function is abnormal with apical hypokinesis. LVEF is estimated at 55%, there is no significant mitral regurgitation  PCI Note: Following the diagnostic procedure, the decision was made to proceed with PCI of the LAD. Weight-based bivalirudin was given for anticoagulation. Once a therapeutic ACT was achieved, a 5 [place] EBU guide catheter was inserted. A pro-water coronary guidewire was used to cross the lesion. The lesion was predilated with a 2.5 mm balloon. The lesion was then stented with a 3.0 x 18 mm vision stent. The stent was postdilated with a 3.25 mm noncompliant balloon. Following PCI, there was 0% residual stenosis and TIMI-3 flow. At this point the patient's pain was improved but was [place] of moderate intensity. We then proceeded to intervene on the ostial ramus intermediate lesion. This was crossed with the prowater wire. This was dilated using a 3.0 x 10 mm cutting balloon performing 3 inflations to 6 atmospheres. This showed a good angiographic result with less than 30% residual stenosis and TIMI grade 3 flow. Final angiography confirmed an excellent result. The patient tolerated the procedure well. There were no immediate procedural complications. A TR band was used for radial hemostasis. The patient was transferred to the post catheterization recovery area for further monitoring.  PCI Data:  Vessel - LAD/Segment - mid vessel immediately after the takeoff of the first diagonal.  Percent Stenosis (pre) 95%  TIMI-flow 3.  Stent 3.0 x 18 mm vision stent.  Percent Stenosis (post) 0%  TIMI-flow (post) 3.  Second vessel: Ostial ramus intermediate Imogen Maddalena  Percent stenosis: 95%.  TIMI flow 3.  3.0 mm cutting balloon  Percent stenosis (post) less than 30%  TIMI flow 3  Final Conclusions:  1. Severe 2 vessel obstructive coronary disease.  2. Overall well preserved LV systolic function with apical  wall motion abnormality and ejection fraction of 55%.  3. Successful  intracoronary stenting of the mid LAD with a bare-metal stent.  4. Successful cutting balloon angioplasty of the ramus intermediate Aldo Sondgeroth.  Recommendations:  Continue therapy with Plavix. Patient reports a history of allergy to aspirin. Aggressive risk factor modification.  Disposition: Patient be observed in the intensive care unit tonight. If she has no complications she should be able to be transferred to telemetry in the morning.   12/2013 Cath HEMODYNAMICS:  Central Aorta: 100/60  Left Ventricle: 100/3  ANGIOGRAPHY:  1. Left main: Normal and trifurcated into an LAD, ramus intermediate vessel, and a dominant left circumflex coronary artery  2. LAD: Leandrew Koyanagi gave rise to a proximal large first diagonal vessel. The LAD just after the diagonal vessel had a widely patent stent. The remainder of the LAD was free of significant disease and extended to the LV apex. 3. Ramus Intermediate: No evidence for restenosis at previous site of ostial cutting balloon intervention. 4. Left circumflex: Angiographically normal. Dominant vessel, which gave rise to 2 marginal branches and in the PDA.Marland Kitchen  4. Right coronary artery: Angiographically normal, nondominant vessel  Left ventriculography revealed global LV function. There was a suggestion of mild residual distal anterolateral hypocontractility. There was catheter-induced mitral regurgitation.  IMPRESSION:  Normal LV function with mild distal anterolateral, residual hypocontractility  No significant residual coronary obstructive disease with evidence for widely patent LAD stent, no evidence for restenosis of the ramus intermediate vessel, normal dominant left circumflex coronary artery and normal nondominant right coronary artery.    12/2013 MPI IMPRESSION: 1. Abnormal Lexiscan for ischemia  2. Large anteroseptal and apical area of ischemia  3. High risk study for major cardiac events based on area of myocardium at  [facility]  4. Normal left ventricular systolic function, left ventricular ejection fraction 52%   12/2019 cath  A drug-eluting stent was successfully placed using a STENT RESOLUTE ONYX 2.5X30. Postdilated to 2.7 mm  Post intervention, there is a 0% residual stenosis throughout the entire segment.Marland Kitchen  ------------  Colon Flattery LAD lesion is 40% stenosed. Prox LAD previously placed BMS stent is 30% stenosed. Prox LAD to Mid LAD lesion is 20% stenosed.  Ost 1st Diag lesion is 99% stenosed. Known from prior catheterizations  Small, nondominant RCA: Prox RCA to Dist RCA lesion is 50% stenosed.  --------------  The left ventricular systolic function is normal. The left ventricular ejection fraction is 55-65% by visual estimate. LV end diastolic pressure is normal.    CULPRIT LESION: Ostial and proximal RAMUS INTERMEDIUS (RI) 95-70%   Successful DES PCI of RI (RESOLUTE ONYX DES 2.5 mm x 30 mm--2.7 mm)  Otherwise moderate diffuse disease: Diffuse moderate LAD 20-30% stenosis and known 99% subtotal occlusion of the very small caliber 1st Diag  Preserved LVEF and normal EDP.   Assessment and Plan  1. CADwith stable angina -no significant symptoms, continue current meds   2.Chronic diastolic HF - no symptoms, continue diuretic   3. HTN - manual recheck 115/75,at goal,continue current meds  4. Hyperlipidemia - at goal, continue statin    Arnoldo Lenis, M.D.

## 2020-04-07 NOTE — Patient Instructions (Signed)
Medication Instructions:   Your physician recommends that you continue on your current medications as directed. Please refer to the Current Medication list given to you today.  *If you need a refill on your cardiac medications before your next appointment, please call your pharmacy*   Lab Work: NONE ORDERED  TODAY  If you have labs (blood work) drawn today and your tests are completely normal, you will receive your results only by: Marland Kitchen MyChart Message (if you have MyChart) OR . A paper copy in the mail If you have any lab test that is abnormal or we need to change your treatment, we will call you to review the results.   Testing/Procedures: NONE ORDERED  TODAY   Follow-Up: At [facility], you and your health needs are our priority.  As part of our continuing mission to provide you with exceptional heart care, we have created designated Provider Care Teams.  These Care Teams include your primary Cardiologist (physician) and Advanced Practice Providers (APPs -  Physician Assistants and Nurse Practitioners) who all work together to provide you with the care you need, when you need it.  We recommend signing up for the patient portal called "MyChart".  Sign up information is provided on this After Visit Summary.  MyChart is used to connect with patients for Virtual Visits (Telemedicine).  Patients are able to view lab/test results, encounter notes, upcoming appointments, etc.  Non-urgent messages can be sent to your provider as well.   To learn more about what you can do with MyChart, go to [email or web address].    Your next appointment:   4 month(s)  The format for your next appointment:   In Person  Provider:   You may see Dina Rich, MD  or one of the following Advanced Practice Providers on your designated Care Team:    Randall An, PA-C   Jacolyn Reedy, [place]     Other Instructions

## 2020-04-08 ENCOUNTER — Encounter: Payer: Self-pay | Admitting: Family Medicine

## 2020-04-08 ENCOUNTER — Ambulatory Visit (INDEPENDENT_AMBULATORY_CARE_PROVIDER_SITE_OTHER): Payer: Medicaid Other | Admitting: Family Medicine

## 2020-04-08 ENCOUNTER — Encounter: Payer: Self-pay | Admitting: *Deleted

## 2020-04-08 ENCOUNTER — Telehealth: Payer: Self-pay | Admitting: *Deleted

## 2020-04-08 VITALS — BP 105/69 | HR 84 | Temp 97.0°F | Ht 70.0 in | Wt 377.0 lb

## 2020-04-08 DIAGNOSIS — I2510 Atherosclerotic heart disease of native coronary artery without angina pectoris: Secondary | ICD-10-CM | POA: Diagnosis not present

## 2020-04-08 DIAGNOSIS — E785 Hyperlipidemia, unspecified: Secondary | ICD-10-CM | POA: Diagnosis not present

## 2020-04-08 DIAGNOSIS — E1159 Type 2 diabetes mellitus with other circulatory complications: Secondary | ICD-10-CM | POA: Diagnosis not present

## 2020-04-08 DIAGNOSIS — E1140 Type 2 diabetes mellitus with diabetic neuropathy, unspecified: Secondary | ICD-10-CM | POA: Diagnosis not present

## 2020-04-08 DIAGNOSIS — M5412 Radiculopathy, cervical region: Secondary | ICD-10-CM | POA: Diagnosis not present

## 2020-04-08 DIAGNOSIS — N182 Chronic kidney disease, stage 2 (mild): Secondary | ICD-10-CM | POA: Diagnosis not present

## 2020-04-08 DIAGNOSIS — G8929 Other chronic pain: Secondary | ICD-10-CM

## 2020-04-08 DIAGNOSIS — Z1159 Encounter for screening for other viral diseases: Secondary | ICD-10-CM | POA: Diagnosis not present

## 2020-04-08 DIAGNOSIS — I10 Essential (primary) hypertension: Secondary | ICD-10-CM | POA: Diagnosis not present

## 2020-04-08 DIAGNOSIS — M5441 Lumbago with sciatica, right side: Secondary | ICD-10-CM | POA: Diagnosis not present

## 2020-04-08 DIAGNOSIS — M5442 Lumbago with sciatica, left side: Secondary | ICD-10-CM | POA: Diagnosis not present

## 2020-04-08 DIAGNOSIS — F411 Generalized anxiety disorder: Secondary | ICD-10-CM

## 2020-04-08 DIAGNOSIS — R110 Nausea: Secondary | ICD-10-CM

## 2020-04-08 DIAGNOSIS — E1169 Type 2 diabetes mellitus with other specified complication: Secondary | ICD-10-CM | POA: Diagnosis not present

## 2020-04-08 DIAGNOSIS — E1122 Type 2 diabetes mellitus with diabetic chronic kidney disease: Secondary | ICD-10-CM | POA: Diagnosis not present

## 2020-04-08 DIAGNOSIS — R142 Eructation: Secondary | ICD-10-CM

## 2020-04-08 DIAGNOSIS — I152 Hypertension secondary to endocrine disorders: Secondary | ICD-10-CM

## 2020-04-08 LAB — BAYER DCA HB A1C WAIVED: HB A1C (BAYER DCA - WAIVED): 5.4 % (ref <7.0)

## 2020-04-08 MED ORDER — VICTOZA 18 MG/3ML ~~LOC~~ SOPN: 1.2000 mg | PEN_INJECTOR | Freq: Every day | SUBCUTANEOUS | 3 refills | Status: DC

## 2020-04-08 MED ORDER — TRAMADOL HCL 50 MG PO TABS: 50.0000 mg | ORAL_TABLET | Freq: Two times a day (BID) | ORAL | 1 refills | Status: DC | PRN

## 2020-04-08 MED ORDER — PANTOPRAZOLE SODIUM 40 MG PO TBEC: 40.0000 mg | DELAYED_RELEASE_TABLET | Freq: Every day | ORAL | 3 refills | Status: DC

## 2020-04-08 MED ORDER — FENOFIBRATE 48 MG PO TABS: 48.0000 mg | ORAL_TABLET | Freq: Every day | ORAL | 3 refills | Status: DC

## 2020-04-08 MED ORDER — PREGABALIN 100 MG PO CAPS: 100.0000 mg | ORAL_CAPSULE | Freq: Two times a day (BID) | ORAL | 5 refills | Status: DC

## 2020-04-08 MED ORDER — MEDROXYPROGESTERONE ACETATE 5 MG PO TABS: 5.0000 mg | ORAL_TABLET | Freq: Every day | ORAL | 1 refills | Status: DC

## 2020-04-08 MED ORDER — FLUOXETINE HCL 40 MG PO CAPS: 40.0000 mg | ORAL_CAPSULE | Freq: Every day | ORAL | 3 refills | Status: DC
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
## 2020-04-08 NOTE — Telephone Encounter (Signed)
OPENED IN ERROR

## 2020-04-08 NOTE — Telephone Encounter (Signed)
APPROVED PA CASE ID [id number], RX# [id number]  PHARMACY NOTIFIED

## 2020-04-08 NOTE — Patient Instructions (Addendum)
Pantoprazole for the burping.  Separate this from your plavix/ clopidogrel by at least 4 hours.  TAKE tramadol SPARINGLY.  There is a potential interaction with the Prozac called serotonin syndrome.  Take 1 tylenol at the SAME TIME as the tramadol if you use it.    You have prescribed a nonsteroidal anti-inflammatory drug (NSAID) today. This will help with your pain and inflammation. Please do not take any other NSAIDs (ibuprofen/Motrin/Advil, naproxen/Aleve, meloxicam/Mobic, Voltaren/diclofenac). Please make sure to eat a meal when taking this medication.   Caution:  If you have a history of acid reflux/indigestion, I recommend that you take an antacid (such as Prilosec, Prevacid) daily while on the NSAID.  If you have a history of bleeding disorder, gastric ulcer, are on a blood thinner (like warfarin/Coumadin, Xarelto, Eliquis, etc) please do not take NSAID.  If you have ever had a heart attack, you should not take NSAIDs.    Serotonin Syndrome Serotonin is a chemical in your body (neurotransmitter) that helps to control several functions, such as:  Brain and nerve cell function.  Mood and emotions.  Memory.  Eating.  Sleeping.  Sexual activity.  Stress response. Having too much serotonin in your body can cause serotonin syndrome. This condition can be harmful to your brain and nerve cells. This can be a life-threatening condition. What are the causes? This condition may be caused by taking medicines or drugs that increase the level of serotonin in your body, such as:  Antidepressant medicines.  Migraine medicines.  Certain pain medicines.  Certain drugs, including ecstasy, LSD, cocaine, and amphetamines.  Over-the-counter cough or cold medicines that contain dextromethorphan.  Certain herbal supplements, including St. John's wort, ginseng, and nutmeg. This condition usually occurs when you take these medicines or drugs in combination, but it can also happen with a  high dose of a single medicine or drug. What increases the risk? You are more likely to develop this condition if:  You just started taking a medicine or drug that increases the level of serotonin in the body.  You recently increased the dose of a medicine or drug that increases the level of serotonin in the body.  You take more than one medicine or drug that increases the level of serotonin in the body. What are the signs or symptoms? Symptoms of this condition usually start within several hours of taking a medicine or drug. Symptoms may be mild or severe. Mild symptoms include:  Sweating.  Restlessness or agitation.  Muscle twitching or stiffness.  Rapid heart rate.  Nausea and vomiting.  Diarrhea.  Headache.  Shivering or goose bumps.  Confusion. Severe symptoms include:  Irregular heartbeat.  Seizures.  Loss of consciousness.  High fever. How is this diagnosed? This condition may be diagnosed based on:  Your medical history.  A physical exam.  Your prior use of drugs and medicines.  Blood or urine tests. These may be used to rule out other causes of your symptoms. How is this treated? The treatment for this condition depends on the severity of your symptoms.  For mild cases, stopping the medicine or drug that caused your condition is usually all that is needed.  For moderate to severe cases, treatment in a hospital may be needed to prevent or manage life-threatening symptoms. This may include medicines to control your symptoms, IV fluids, interventions to support your breathing, and treatments to control your body temperature. Follow these instructions at home: Medicines   Take over-the-counter and prescription medicines only as  told by your health care provider. This is important.  Check with your health care provider before you start taking any new prescriptions, over-the-counter medicines, herbs, or supplements.  Avoid combining any medicines that  can cause this condition to occur. Lifestyle   Maintain a healthy lifestyle. ? Eat a healthy diet that includes plenty of vegetables, fruits, whole grains, low-fat dairy products, and lean protein. Do not eat a lot of foods that are high in fat, added sugars, or salt. ? Get the right amount and quality of sleep. Most adults need 7-9 hours of sleep each night. ? Make time to exercise, even if it is only for short periods of time. Most adults should exercise for at least 150 minutes each week. ? Do not drink alcohol. ? Do not use illegal drugs, and do not take medicines for reasons other than they are prescribed. General instructions  Do not use any products that contain nicotine or tobacco, such as cigarettes and e-cigarettes. If you need help quitting, ask your health care provider.  Keep all follow-up visits as told by your health care provider. This is important. Contact a health care provider if:  Your symptoms do not improve or they get worse. Get help right away if you:  Have worsening confusion, severe headache, chest pain, high fever, seizures, or loss of consciousness.  Experience serious side effects of medicine, such as swelling of your face, lips, tongue, or throat.  Have serious thoughts about hurting yourself or others. These symptoms may represent a serious problem that is an emergency. Do not wait to see if the symptoms will go away. Get medical help right away. Call your local emergency services (911 in the [place] Do not drive yourself to the hospital. If you ever feel like you may hurt yourself or others, or have thoughts about taking your own life, get help right away. You can go to your nearest emergency department or call:  Your local emergency services (911 in the [place]  A suicide crisis helpline, such as the National Suicide Prevention Lifeline at 608-011-7420. This is open 24 hours a day. Summary  Serotonin is a brain chemical that helps to regulate the  nervous system. High levels of serotonin in the body can cause serotonin syndrome, which is a very dangerous condition.  This condition may be caused by taking medicines or drugs that increase the level of serotonin in your body.  Treatment depends on the severity of your symptoms. For mild cases, stopping the medicine or drug that caused your condition is usually all that is needed.  Check with your health care provider before you start taking any new prescriptions, over-the-counter medicines, herbs, or supplements. This information is not intended to replace advice given to you by your health care provider. Make sure you discuss any questions you have with your health care provider. Document Revised: 10/21/2017 Document Reviewed: 10/21/2017 Elsevier Patient Education  2020 [employer].

## 2020-04-08 NOTE — Telephone Encounter (Signed)
PA for tramadol came in today by fax Key: [id number]  Sent to plan  For chronic low back pain

## 2020-04-09 ENCOUNTER — Encounter (HOSPITAL_COMMUNITY)
Admission: RE | Admit: 2020-04-09 | Discharge: 2020-04-09 | Disposition: A | Discharge status: Home / Self Care | Payer: Medicaid Other | Source: Ambulatory Visit | Attending: Cardiology | Admitting: Cardiology

## 2020-04-09 ENCOUNTER — Other Ambulatory Visit: Payer: Self-pay

## 2020-04-09 DIAGNOSIS — I214 Non-ST elevation (NSTEMI) myocardial infarction: Secondary | ICD-10-CM | POA: Diagnosis not present

## 2020-04-09 DIAGNOSIS — Z955 Presence of coronary angioplasty implant and graft: Secondary | ICD-10-CM

## 2020-04-09 LAB — CMP14+EGFR
ALT: 16 IU/L (ref 0–32)
AST: 18 IU/L (ref 0–40)
Albumin/Globulin Ratio: 1.5 (ref 1.2–2.2)
Albumin: 4.1 g/dL (ref 3.8–4.8)
Alkaline Phosphatase: 70 IU/L (ref 48–121)
BUN/Creatinine Ratio: 15 (ref 9–23)
BUN: 12 mg/dL (ref 6–24)
Bilirubin Total: 0.4 mg/dL (ref 0.0–1.2)
CO2: 19 mmol/L — ABNORMAL LOW (ref 20–29)
Calcium: 9.2 mg/dL (ref 8.7–10.2)
Chloride: 106 mmol/L (ref 96–106)
Creatinine, Ser: 0.82 mg/dL (ref 0.57–1.00)
GFR calc Af Amer: 103 mL/min/{1.73_m2} (ref >59)
GFR calc non Af Amer: 89 mL/min/{1.73_m2} (ref >59)
Globulin, Total: 2.8 g/dL (ref 1.5–4.5)
Glucose: 112 mg/dL — ABNORMAL HIGH (ref 65–99)
Potassium: 4.1 mmol/L (ref 3.5–5.2)
Sodium: 137 mmol/L (ref 134–144)
Total Protein: 6.9 g/dL (ref 6.0–8.5)

## 2020-04-09 LAB — HEPATITIS C ANTIBODY: Hep C Virus Ab: <0.1 s/co ratio (ref 0.0–0.9)

## 2020-04-09 LAB — TSH: TSH: 0.608 u[IU]/mL (ref 0.450–4.500)

## 2020-04-09 NOTE — Progress Notes (Signed)
Daily Session Note  Patient Details  Name: [name] MRN: [id number] Date of Birth: [birth year] Referring Provider:     CARDIAC REHAB PHASE II ORIENTATION from 03/19/2020 in [facility]  Referring Provider Dr. Harl Bowie      Encounter Date: 04/09/2020  Check In:  Session Check In - 04/09/20 0815      Check-In   Supervising physician immediately available to respond to emergencies See telemetry face sheet for immediately available ER MD    Location [facility]    Staff Present Algis Downs, Exercise Physiologist;Dalton Kris Mouton, MS, ACSM-CEP, Exercise Physiologist;Debra Wynetta Emery, RN, BSN;Carlette Carlton, RN, BSN    Virtual Visit No    Medication changes reported     No    Fall or balance concerns reported    No    Tobacco Cessation No Change    Current number of cigarettes/nicotine per day     7    Warm-up and Cool-down Performed as group-led instruction    Resistance Training Performed Yes    VAD Patient? No    PAD/SET Patient? No      Pain Assessment   Currently in Pain? No/denies    Pain Score 0-No pain    Multiple Pain Sites No           Capillary Blood Glucose: Results for orders placed or performed in visit on 04/08/20 (from the past 24 hour(s))  Hepatitis C antibody     Status: None   Collection Time: 04/08/20  8:49 AM  Result Value Ref Range   Hep C Virus Ab <0.1 0.0 - 0.9 s/co ratio   Narrative   Performed at:  [facility] [address] [place], [place]  [id number] Lab Director: Rush Farmer MD, Phone:  [phone]  [place] Hb A1c Waived     Status: None   Collection Time: 04/08/20  8:49 AM  Result Value Ref Range   HB A1C (BAYER DCA - WAIVED) 5.4 <7.0 %   Narrative   Performed at:  [address] [place], [place]  [id number] Lab Director: Colletta [facility], Phone:  [phone]  CMP14+EGFR     Status: Abnormal   Collection Time: 04/08/20  8:49 AM  Result Value Ref  Range   Glucose 112 (H) 65 - 99 mg/dL   BUN 12 6 - 24 mg/dL   Creatinine, Ser 0.82 0.57 - 1.00 mg/dL   GFR calc non Af Amer 89 >59 mL/min/1.73   GFR calc Af Amer 103 >59 mL/min/1.73   BUN/Creatinine Ratio 15 9 - 23   Sodium 137 134 - 144 mmol/L   Potassium 4.1 3.5 - 5.2 mmol/L   Chloride 106 96 - 106 mmol/L   CO2 19 (L) 20 - 29 mmol/L   Calcium 9.2 8.7 - 10.2 mg/dL   Total Protein 6.9 6.0 - 8.5 g/dL   Albumin 4.1 3.8 - 4.8 g/dL   Globulin, Total 2.8 1.5 - 4.5 g/dL   Albumin/Globulin Ratio 1.5 1.2 - 2.2   Bilirubin Total 0.4 0.0 - 1.2 mg/dL   Alkaline Phosphatase 70 48 - 121 IU/L   AST 18 0 - 40 IU/L   ALT 16 0 - 32 IU/L   Narrative   Performed at:  [facility] [address], [place], [place]  [id number] Lab Director: Rush Farmer MD, Phone:  [phone]  TSH     Status: None   Collection Time: 04/08/20  8:49 AM  Result  Value Ref Range   TSH 0.608 0.450 - 4.500 uIU/mL   Narrative   Performed at:  [address], [place], [place]  [id number] Lab Director: Rush Farmer MD, Phone:  [phone]      Social History   Tobacco Use  Smoking Status Current Every Day Smoker  . Packs/day: 0.50  . Years: 20.00  . Pack years: 10.00  . Types: Cigarettes  . Start date: 03/18/1990  Smokeless Tobacco Never Used    Goals Met:  Independence with exercise equipment Exercise tolerated well No report of cardiac concerns or symptoms Strength training completed today  Goals Unmet:  Not Applicable  Comments: Check out 915   Dr. Kathie Dike is Medical Director for [facility].

## 2020-04-10 LAB — TOXASSURE SELECT 13 (MW), URINE

## 2020-04-11 ENCOUNTER — Encounter (HOSPITAL_COMMUNITY)
Admission: RE | Admit: 2020-04-11 | Discharge: 2020-04-11 | Disposition: A | Discharge status: Home / Self Care | Payer: Medicaid Other | Source: Ambulatory Visit | Attending: Cardiology | Admitting: Cardiology

## 2020-04-11 ENCOUNTER — Other Ambulatory Visit: Payer: Self-pay

## 2020-04-11 DIAGNOSIS — I214 Non-ST elevation (NSTEMI) myocardial infarction: Secondary | ICD-10-CM | POA: Diagnosis not present

## 2020-04-11 DIAGNOSIS — Z955 Presence of coronary angioplasty implant and graft: Secondary | ICD-10-CM

## 2020-04-11 NOTE — Progress Notes (Signed)
Daily Session Note  Patient Details  Name: [name] MRN: [id number] Date of Birth: [birth year] Referring Provider:     CARDIAC REHAB PHASE II ORIENTATION from 03/19/2020 in [place]  Referring Provider Dr. Harl Bowie      Encounter Date: 04/11/2020  Check In:  Session Check In - 04/11/20 0841      Check-In   Supervising physician immediately available to respond to emergencies See telemetry face sheet for immediately available ER MD    Location [facility]    Staff Present Algis Downs, Exercise Physiologist;Boluwatife Mutchler Kris Mouton, MS, ACSM-CEP, Exercise Physiologist;Carlette Wilber Oliphant, RN, BSN    Virtual Visit No    Medication changes reported     No    Tobacco Cessation No Change    Current number of cigarettes/nicotine per day     7    Warm-up and Cool-down Performed as group-led instruction    Resistance Training Performed Yes    VAD Patient? No    PAD/SET Patient? No      Pain Assessment   Currently in Pain? No/denies    Pain Score 0-No pain    Multiple Pain Sites No           Capillary Blood Glucose: No results found for this or any previous visit (from the past 24 hour(s)).    Social History   Tobacco Use  Smoking Status Current Every Day Smoker  . Packs/day: 0.50  . Years: 20.00  . Pack years: 10.00  . Types: Cigarettes  . Start date: 03/18/1990  Smokeless Tobacco Never Used    Goals Met:  Independence with exercise equipment Exercise tolerated well No report of cardiac concerns or symptoms Strength training completed today  Goals Unmet:  Not Applicable  Comments: checkout time is 0915   Dr. Kathie Dike is Medical Director for [facility].

## 2020-04-14 ENCOUNTER — Encounter (HOSPITAL_COMMUNITY): Payer: Medicaid Other

## 2020-04-14 ENCOUNTER — Encounter: Payer: Self-pay | Admitting: Internal Medicine

## 2020-04-16 ENCOUNTER — Encounter (HOSPITAL_COMMUNITY)
Admission: RE | Admit: 2020-04-16 | Discharge: 2020-04-16 | Disposition: A | Discharge status: Home / Self Care | Payer: Medicaid Other | Source: Ambulatory Visit | Attending: Cardiology | Admitting: Cardiology

## 2020-04-16 ENCOUNTER — Other Ambulatory Visit: Payer: Self-pay

## 2020-04-16 DIAGNOSIS — Z955 Presence of coronary angioplasty implant and graft: Secondary | ICD-10-CM | POA: Diagnosis not present

## 2020-04-16 DIAGNOSIS — I214 Non-ST elevation (NSTEMI) myocardial infarction: Secondary | ICD-10-CM

## 2020-04-16 NOTE — Progress Notes (Signed)
Daily Session Note  Patient Details  Name: [name] MRN: [id number] Date of Birth: 04/22/1981 Referring Provider:     CARDIAC REHAB PHASE II ORIENTATION from 03/19/2020 in [place]  Referring Provider Dr. Harl Bowie      Encounter Date: 04/16/2020  Check In:  Session Check In - 04/16/20 0834      Check-In   Supervising physician immediately available to respond to emergencies See telemetry face sheet for immediately available ER MD    [facility]    Staff Present Algis Downs, Exercise Physiologist;Leandro Berkowitz Kris Mouton, MS, ACSM-CEP, Exercise Physiologist;Carlette Wilber Oliphant, RN, BSN    Virtual Visit No    Medication changes reported     No    Fall or balance concerns reported    No    Tobacco Cessation No Change    Warm-up and Cool-down Performed as group-led instruction    Resistance Training Performed Yes    VAD Patient? No    PAD/SET Patient? No      Pain Assessment   Currently in Pain? No/denies    Pain Score 0-No pain    Multiple Pain Sites No           Capillary Blood Glucose: No results found for this or any previous visit (from the past 24 hour(s)).    Social History   Tobacco Use  Smoking Status Current Every Day Smoker  . Packs/day: 0.50  . Years: 20.00  . Pack years: 10.00  . Types: Cigarettes  . Start date: 03/18/1990  Smokeless Tobacco Never Used    Goals Met:  Independence with exercise equipment Exercise tolerated well No report of cardiac concerns or symptoms Strength training completed today  Goals Unmet:  Not Applicable  Comments: checkout time is 0915   Dr. Kathie Dike is Medical Director for [facility].

## 2020-04-17 ENCOUNTER — Other Ambulatory Visit: Payer: Self-pay

## 2020-04-17 ENCOUNTER — Encounter: Payer: Self-pay | Admitting: Orthopaedic Surgery

## 2020-04-17 ENCOUNTER — Ambulatory Visit (INDEPENDENT_AMBULATORY_CARE_PROVIDER_SITE_OTHER): Payer: Medicaid Other

## 2020-04-17 ENCOUNTER — Ambulatory Visit (INDEPENDENT_AMBULATORY_CARE_PROVIDER_SITE_OTHER): Payer: Medicaid Other | Admitting: Orthopaedic Surgery

## 2020-04-17 VITALS — BP 108/67 | HR 83 | Ht 69.0 in | Wt 375.0 lb

## 2020-04-17 DIAGNOSIS — M7062 Trochanteric bursitis, left hip: Secondary | ICD-10-CM | POA: Diagnosis not present

## 2020-04-17 DIAGNOSIS — M545 Low back pain, unspecified: Secondary | ICD-10-CM

## 2020-04-17 DIAGNOSIS — G8929 Other chronic pain: Secondary | ICD-10-CM | POA: Diagnosis not present

## 2020-04-17 NOTE — Progress Notes (Signed)
Office Visit Note   Patient: [name]           Date of Birth: 23-Sep-1981           MRN: [id number] Visit Date: 04/17/2020              Requested by: Raliegh Ip, DO [address] [place],  [place] [address] PCP: Raliegh Ip, DO   Assessment & Plan: Visit Diagnoses:  1. Chronic bilateral low back pain, unspecified whether sciatica present   2. Trochanteric bursitis, left hip     Plan: Trochanteric injection performed with improvement in her pain.  Some of the elliptical activity she had done for cardio work may have aggravated her hip with trochanteric bursitis.  Hopefully this will give her sustained relief she walks much better postinjection.  Follow-up as needed.  Follow-Up Instructions: Return if symptoms worsen or fail to improve.   Orders:  Orders Placed This Encounter  Procedures  . Large Joint Inj: L greater trochanter  . XR Lumbar Spine 2-3 Views   No orders of the defined types were placed in this encounter.     Procedures: Large Joint Inj: L greater trochanter on 04/17/2020 3:39 PM Details: lateral approach Medications: 0.5 mL lidocaine 1 %; 2 mL bupivacaine 0.25 %; 40 mg methylPREDNISolone acetate 40 MG/ML      Clinical Data: No additional findings.   Subjective: Chief Complaint  Patient presents with  . Lower Back - Pain  . Middle Back - Pain    HPI 39 year old female with severe morbid obesity BMI 55 with complaints of back pain that radiates into her left hip.  She has had previous anterior MI with stents 2013 and most recently 2021.  She smoked half pack per day for 25 years.  She has problems with depression anxiety heart disease hypertension kidney disease migraines pneumonia diastolic heart failure.  She is supposed to be starting cardiac rehab but states her back is been bothering her radiating into her hips bothering her with ambulation.  Review of Systems Noncontributory other than as mentioned above with  HPI.  Objective: Vital Signs: BP 108/67   Pulse 83   Ht 5\' 9"  (1.753 m)   Wt (!) 375 lb (170.1 kg)   BMI 55.38 kg/m   Physical Exam Constitutional:      Appearance: She is well-developed.  HENT:     Head: Normocephalic.     Right Ear: External ear normal.     Left Ear: External ear normal.  Eyes:     Pupils: Pupils are equal, round, and reactive to light.  Neck:     Thyroid: No thyromegaly.     Trachea: No tracheal deviation.  Cardiovascular:     Rate and Rhythm: Normal rate.  Pulmonary:     Effort: Pulmonary effort is normal.  Abdominal:     Palpations: Abdomen is soft.     Comments: Liver and spleen not palpable.  Musculoskeletal:     Cervical back: Normal range of motion.  Skin:    General: Skin is warm and dry.  Neurological:     Mental Status: She is alert and oriented to person, place, and time.  Psychiatric:        Behavior: Behavior normal.     Ortho Exam patient has some pain with straight leg raising but this is mostly over the greater trochanter on the left.  No tenderness on the right.  Anterior tib EHL peroneals gastrocsoleus are intact.  With standing ambulation she has exquisite tenderness over the left greater trochanter.  Mild sciatic notch tenderness.    Specialty Comments:  No specialty comments available.  Imaging: No results found.   PMFS History: Patient Active Problem List   Diagnosis Date Noted  . Trochanteric bursitis, left hip 04/21/2020  . NSTEMI (non-ST elevated myocardial infarction) (HCC) 01/13/2020  . Angina pectoris (HCC) 08/15/2018  . Chronic diastolic CHF (congestive heart failure) (HCC) 08/15/2018  . Essential hypertension 10/28/2017  . History of chronic CHF 10/28/2017  . PCOS (polycystic ovarian syndrome) 10/28/2017  . Diabetic neuropathy, painful (HCC) 10/28/2017  . Chronic back pain 10/28/2017  . Coronary artery disease   . DM2 (diabetes mellitus, type 2) (HCC) 10/10/2011  . History of acute anterior wall MI  10/09/2011  . Morbid obesity (HCC) 10/09/2011  . Tobacco abuse 10/09/2011  . Hyperlipidemia 10/09/2011   Past Medical History:  Diagnosis Date  . Allergy   . Anemia   . Anxiety   . CHF (congestive heart failure) (HCC)   . Chronic kidney disease    history of kidney stones  . Coronary artery disease 09/2011   a.s/p BMS to LAD and angioplasty of RI in 09/2011 b. patent stent by cath in 02/2012, 12/2013, and 02/2016 with most recent showing 30-40% dLAD and 30-40% RCA stenosis  . Depression   . Diabetes mellitus   . Hyperlipidemia   . Hypertension   . Midsternal chest pain 03/03/2012  . Migraines   . Morbid obesity (HCC)   . Myocardial infarct (HCC) 09/2011  . Myocardial infarction (HCC) 02/2016  . Palpitations 02/17/2012  . Sleep apnea   . Ureteral stone with hydronephrosis   . Urinary tract obstruction due to kidney stone 02/15/2017    Family History  Problem Relation Age of Onset  . Heart murmur Father   . Diabetes Father   . Hypertension Father   . Hyperlipidemia Father   . Cancer Father        throat  . Diabetes Mother   . Cancer Mother        breast.uterine, ovarian  . Asthma Son   . Appendicitis Son   . Hernia Son   . Mental illness Son        Bipolar, personality d/o  . Stroke Maternal Grandmother   . Cancer Maternal Grandmother        breast  . Cancer Paternal Grandfather        lung    Past Surgical History:  Procedure Laterality Date  . CARDIAC CATHETERIZATION  10/08/2011   LAD: 95% mid, Ramus: 95% ostial  . CARPAL TUNNEL RELEASE    . CESAREAN SECTION    . CHOLECYSTECTOMY    . CORONARY ANGIOPLASTY WITH STENT PLACEMENT  10/08/2011   LAD: BMS, Ramus: cutting balloon angioplasty  . CORONARY STENT INTERVENTION N/A 01/14/2020   Procedure: CORONARY STENT INTERVENTION;  Surgeon: Marykay Lex, MD;  Location: [facility] INVASIVE CV LAB;  Service: Cardiovascular;  Laterality: N/A;  . CYSTOSCOPY WITH RETROGRADE PYELOGRAM, URETEROSCOPY AND STENT PLACEMENT Left 02/16/2017    Procedure: CYSTOSCOPY WITH RETROGRADE PYELOGRAM, URETEROSCOPY AND STENT PLACEMENT;  Surgeon: Ihor Gully, MD;  Location: [facility] ORS;  Service: Urology;  Laterality: Left;  . CYSTOSCOPY WITH RETROGRADE PYELOGRAM, URETEROSCOPY AND STENT PLACEMENT Left 03/28/2017   Procedure: LEFT STENT REMOVAL LEFT RETROGRADE PYELOGRAM LEFT URETEROSCOPY   LASER LITHOTRIPSY  AND  STENT REPLACEMENT;  Surgeon: Malen Gauze, MD;  Location: [facility];  Service: Urology;  Laterality: Left;  .  LEFT HEART CATH AND CORONARY ANGIOGRAPHY N/A 08/15/2018   Procedure: LEFT HEART CATH AND CORONARY ANGIOGRAPHY;  Surgeon: Swaziland, Peter M, MD;  Location: [facility] INVASIVE CV LAB;  Service: Cardiovascular;  Laterality: N/A;  . LEFT HEART CATH AND CORONARY ANGIOGRAPHY N/A 01/14/2020   Procedure: LEFT HEART CATH AND CORONARY ANGIOGRAPHY;  Surgeon: Marykay Lex, MD;  Location: [facility] INVASIVE CV LAB;  Service: Cardiovascular;  Laterality: N/A;  . LEFT HEART CATHETERIZATION WITH CORONARY ANGIOGRAM N/A 10/08/2011   Procedure: LEFT HEART CATHETERIZATION WITH CORONARY ANGIOGRAM;  Surgeon: Peter M Swaziland, MD;  Location: [facility] CATH LAB;  Service: Cardiovascular;  Laterality: N/A;  . LEFT HEART CATHETERIZATION WITH CORONARY ANGIOGRAM N/A 03/02/2012   Procedure: LEFT HEART CATHETERIZATION WITH CORONARY ANGIOGRAM;  Surgeon: Kathleene Hazel, MD;  Location: [facility] CATH LAB;  Service: Cardiovascular;  Laterality: N/A;  . LEFT HEART CATHETERIZATION WITH CORONARY ANGIOGRAM N/A 01/04/2014   Procedure: LEFT HEART CATHETERIZATION WITH CORONARY ANGIOGRAM;  Surgeon: Lennette Bihari, MD;  Location: [facility] CATH LAB;  Service: Cardiovascular;  Laterality: N/A;   Social History   Occupational History  . Not on file  Tobacco Use  . Smoking status: Current Every Day Smoker    Packs/day: 0.50    Years: 20.00    Pack years: 10.00    Types: Cigarettes    Start date: 03/18/1990  . Smokeless tobacco: Never Used  Vaping Use  . Vaping Use: Former  Substance and Sexual  Activity  . Alcohol use: Yes    Alcohol/week: 0.0 standard drinks    Comment: maybe once a year  . Drug use: No  . Sexual activity: Yes    Birth control/protection: None    Comment: PCOS

## 2020-04-18 ENCOUNTER — Encounter (HOSPITAL_COMMUNITY)
Admission: RE | Admit: 2020-04-18 | Discharge: 2020-04-18 | Disposition: A | Discharge status: Home / Self Care | Payer: Medicaid Other | Source: Ambulatory Visit | Attending: Cardiology | Admitting: Cardiology

## 2020-04-18 DIAGNOSIS — I214 Non-ST elevation (NSTEMI) myocardial infarction: Secondary | ICD-10-CM | POA: Diagnosis not present

## 2020-04-18 DIAGNOSIS — Z955 Presence of coronary angioplasty implant and graft: Secondary | ICD-10-CM

## 2020-04-18 NOTE — Progress Notes (Signed)
Daily Session Note  Patient Details  Name: [name] MRN: [id number] Date of Birth: 1981/05/04 Referring Provider:     CARDIAC REHAB PHASE II ORIENTATION from 03/19/2020 in [place]  Referring Provider Dr. Harl Bowie      Encounter Date: 04/18/2020  Check In:  Session Check In - 04/18/20 0836      Check-In   Supervising physician immediately available to respond to emergencies See telemetry face sheet for immediately available ER MD    Location [facility]    Staff Present Algis Downs, Exercise Physiologist;Zoe Nordin Kris Mouton, MS, ACSM-CEP, Exercise Physiologist;Carlette Wilber Oliphant, RN, BSN    Virtual Visit No    Medication changes reported     No    Fall or balance concerns reported    No    Tobacco Cessation No Change    Warm-up and Cool-down Performed as group-led instruction    Resistance Training Performed Yes    VAD Patient? No    PAD/SET Patient? No      Pain Assessment   Currently in Pain? No/denies    Pain Score 0-No pain    Multiple Pain Sites No           Capillary Blood Glucose: No results found for this or any previous visit (from the past 24 hour(s)).    Social History   Tobacco Use  Smoking Status Current Every Day Smoker  . Packs/day: 0.50  . Years: 20.00  . Pack years: 10.00  . Types: Cigarettes  . Start date: 03/18/1990  Smokeless Tobacco Never Used    Goals Met:  Independence with exercise equipment Exercise tolerated well No report of cardiac concerns or symptoms Strength training completed today  Goals Unmet:  Not Applicable  Comments: checkout time is 0915   Dr. Kathie Dike is Medical Director for [facility] Re[facility].

## 2020-04-21 ENCOUNTER — Encounter (HOSPITAL_COMMUNITY): Payer: Medicaid Other

## 2020-04-21 ENCOUNTER — Ambulatory Visit: Payer: Medicaid Other | Admitting: Internal Medicine

## 2020-04-21 DIAGNOSIS — M7062 Trochanteric bursitis, left hip: Secondary | ICD-10-CM | POA: Insufficient documentation

## 2020-04-21 MED ORDER — BUPIVACAINE HCL 0.25 % IJ SOLN
2.0000 mL | INTRAMUSCULAR | Status: AC | PRN
  Administered 2020-04-17: 2 mL via INTRA_ARTICULAR

## 2020-04-21 MED ORDER — METHYLPREDNISOLONE ACETATE 40 MG/ML IJ SUSP
40.0000 mg | INTRAMUSCULAR | Status: AC | PRN
  Administered 2020-04-17: 40 mg via INTRA_ARTICULAR

## 2020-04-21 MED ORDER — LIDOCAINE HCL 1 % IJ SOLN
0.5000 mL | INTRAMUSCULAR | Status: AC | PRN
  Administered 2020-04-17: .5 mL

## 2020-04-23 ENCOUNTER — Other Ambulatory Visit: Payer: Self-pay

## 2020-04-23 ENCOUNTER — Encounter (HOSPITAL_COMMUNITY)
Admission: RE | Admit: 2020-04-23 | Discharge: 2020-04-23 | Disposition: A | Discharge status: Home / Self Care | Payer: Medicaid Other | Source: Ambulatory Visit | Attending: Cardiology | Admitting: Cardiology

## 2020-04-23 DIAGNOSIS — I214 Non-ST elevation (NSTEMI) myocardial infarction: Secondary | ICD-10-CM

## 2020-04-23 DIAGNOSIS — Z955 Presence of coronary angioplasty implant and graft: Secondary | ICD-10-CM | POA: Diagnosis not present

## 2020-04-23 NOTE — Progress Notes (Signed)
Daily Session Note  Patient Details  Name: [name] MRN: [id number] Date of Birth: 11-Oct-1980 Referring Provider:     CARDIAC REHAB PHASE II ORIENTATION from 03/19/2020 in [place]  Referring Provider Dr. Harl Bowie      Encounter Date: 04/23/2020  Check In:  Session Check In - 04/23/20 0845      Check-In   Supervising physician immediately available to respond to emergencies See telemetry face sheet for immediately available ER MD    Location [facility]    Staff Present Algis Downs, Exercise Physiologist;Kealy Lewter Kris Mouton, MS, ACSM-CEP, Exercise Physiologist;Carlette Wilber Oliphant, RN, BSN    Virtual Visit No    Medication changes reported     No    Fall or balance concerns reported    No    Tobacco Cessation No Change    Current number of cigarettes/nicotine per day     7    Warm-up and Cool-down Performed as group-led instruction    Resistance Training Performed Yes    VAD Patient? No    PAD/SET Patient? No      Pain Assessment   Currently in Pain? No/denies    Pain Score 0-No pain    Multiple Pain Sites No           Capillary Blood Glucose: No results found for this or any previous visit (from the past 24 hour(s)).    Social History   Tobacco Use  Smoking Status Current Every Day Smoker  . Packs/day: 0.50  . Years: 20.00  . Pack years: 10.00  . Types: Cigarettes  . Start date: 03/18/1990  Smokeless Tobacco Never Used    Goals Met:  Independence with exercise equipment Exercise tolerated well No report of cardiac concerns or symptoms Strength training completed today  Goals Unmet:  Not Applicable  Comments: checkout time is 0915   Dr. Kathie Dike is Medical Director for [facility] Pul[facility] Re[facility].

## 2020-04-25 ENCOUNTER — Encounter (HOSPITAL_COMMUNITY): Payer: Medicaid Other

## 2020-04-28 ENCOUNTER — Other Ambulatory Visit: Payer: Self-pay

## 2020-04-28 ENCOUNTER — Encounter (HOSPITAL_COMMUNITY)
Admission: RE | Admit: 2020-04-28 | Discharge: 2020-04-28 | Disposition: A | Discharge status: Home / Self Care | Payer: Medicaid Other | Source: Ambulatory Visit | Attending: Cardiology | Admitting: Cardiology

## 2020-04-28 DIAGNOSIS — Z955 Presence of coronary angioplasty implant and graft: Secondary | ICD-10-CM | POA: Diagnosis not present

## 2020-04-28 DIAGNOSIS — I214 Non-ST elevation (NSTEMI) myocardial infarction: Secondary | ICD-10-CM | POA: Diagnosis not present

## 2020-04-28 NOTE — Progress Notes (Signed)
Daily Session Note  Patient Details  Name: MALYNDA SMOLINSKI MRN: [id number] Date of Birth: [birth year] Referring Provider:     CARDIAC REHAB PHASE II ORIENTATION from 03/19/2020 in [place]  Referring Provider Dr. Harl Bowie      Encounter Date: 04/28/2020  Check In:  Session Check In - 04/28/20 0820      Check-In   Supervising physician immediately available to respond to emergencies See telemetry face sheet for immediately available ER MD    Location [facility]    Staff Present Algis Downs, Exercise Physiologist;Kelyn Ponciano Kris Mouton, MS, ACSM-CEP, Exercise Physiologist;Carlette Wilber Oliphant, RN, BSN    Virtual Visit No    Medication changes reported     No    Fall or balance concerns reported    No    Tobacco Cessation No Change    Warm-up and Cool-down Performed as group-led instruction    Resistance Training Performed Yes    VAD Patient? No    PAD/SET Patient? No      Pain Assessment   Currently in Pain? No/denies    Pain Score 0-No pain    Multiple Pain Sites No           Capillary Blood Glucose: No results found for this or any previous visit (from the past 24 hour(s)).    Social History   Tobacco Use  Smoking Status Current Every Day Smoker  . Packs/day: 0.50  . Years: 20.00  . Pack years: 10.00  . Types: Cigarettes  . Start date: 03/18/1990  Smokeless Tobacco Never Used    Goals Met:  Independence with exercise equipment Exercise tolerated well No report of cardiac concerns or symptoms Strength training completed today  Goals Unmet:  Not Applicable  Comments: checkout time is 0915   Dr. Kathie Dike is Medical Director for [facility].

## 2020-04-30 ENCOUNTER — Encounter (HOSPITAL_COMMUNITY)
Admission: RE | Admit: 2020-04-30 | Discharge: 2020-04-30 | Disposition: A | Discharge status: Home / Self Care | Payer: Medicaid Other | Source: Ambulatory Visit | Attending: Cardiology | Admitting: Cardiology

## 2020-04-30 ENCOUNTER — Other Ambulatory Visit: Payer: Self-pay

## 2020-04-30 VITALS — Wt 378.1 lb

## 2020-04-30 DIAGNOSIS — I214 Non-ST elevation (NSTEMI) myocardial infarction: Secondary | ICD-10-CM

## 2020-04-30 DIAGNOSIS — Z955 Presence of coronary angioplasty implant and graft: Secondary | ICD-10-CM | POA: Diagnosis not present

## 2020-04-30 NOTE — Progress Notes (Signed)
Daily Session Note  Patient Details  Name: [name] MRN: [id number] Date of Birth: [birth year] Referring Provider:     CARDIAC REHAB PHASE II ORIENTATION from 03/19/2020 in [place]  Referring Provider Dr. Harl Bowie      Encounter Date: 04/30/2020  Check In:  Session Check In - 04/30/20 0824      Check-In   Supervising physician immediately available to respond to emergencies See telemetry face sheet for immediately available MD    Location [facility]    Staff Present Algis Downs, Exercise Physiologist;Bobby Ragan Kris Mouton, MS, ACSM-CEP, Exercise Physiologist;Carlette Wilber Oliphant, RN, BSN    Virtual Visit No    Medication changes reported     No    Fall or balance concerns reported    No    Tobacco Cessation No Change    Warm-up and Cool-down Performed as group-led instruction    Resistance Training Performed Yes    VAD Patient? No    PAD/SET Patient? No      Pain Assessment   Currently in Pain? No/denies    Pain Score 0-No pain    Multiple Pain Sites No           Capillary Blood Glucose: No results found for this or any previous visit (from the past 24 hour(s)).    Social History   Tobacco Use  Smoking Status Current Every Day Smoker  . Packs/day: 0.50  . Years: 20.00  . Pack years: 10.00  . Types: Cigarettes  . Start date: 03/18/1990  Smokeless Tobacco Never Used    Goals Met:  Independence with exercise equipment Exercise tolerated well No report of cardiac concerns or symptoms Strength training completed today  Goals Unmet:  Not Applicable  Comments: checkout time is 0915   Dr. Kathie Dike is Medical Director for [facility].

## 2020-04-30 NOTE — Progress Notes (Signed)
Cardiac Individual Treatment Plan  Patient Details  Name: [name] MRN: [id number] Date of Birth: 24-Jan-1981 Referring Provider:     CARDIAC REHAB PHASE II ORIENTATION from 03/19/2020 in [place]  Referring Provider Dr. Harl Bowie      Initial Encounter Date:    CARDIAC REHAB PHASE II ORIENTATION from 03/19/2020 in [place]  Date 03/19/20      Visit Diagnosis: NSTEMI (non-ST elevated myocardial infarction) [facility])  Status post coronary artery stent placement  Patient's Home Medications on Admission:  Current Outpatient Medications:  .  ACCU-CHEK AVIVA PLUS test strip, USE TO TEST BLOOD SUGAR FOUR TIMES DAILY, Disp: 400 strip, Rfl: 3 .  aspirin 81 MG chewable tablet, Chew 81 mg by mouth daily., Disp: , Rfl:  .  blood glucose meter kit and supplies, Dispense based on patient and insurance preference. Use up to four times daily as directed. (FOR ICD-10:  E11.22). Check BG daily, Disp: 1 each, Rfl: 0 .  carvedilol (COREG) 25 MG tablet, TAKE 1 TABLET BY MOUTH TWICE DAILY WITH A MEAL (Patient taking differently: Take 25 mg by mouth 2 (two) times daily with a meal. TAKE 1 TABLET BY MOUTH TWICE DAILY WITH A MEAL), Disp: 180 tablet, Rfl: 3 .  clopidogrel (PLAVIX) 75 MG tablet, take 300 mg (4 tablets) on Day 1, then take 75 mg (1 tablet) daily., Disp: 94 tablet, Rfl: 3 .  fenofibrate (TRICOR) 48 MG tablet, Take 1 tablet (48 mg total) by mouth daily., Disp: 90 tablet, Rfl: 3 .  FLUoxetine (PROZAC) 40 MG capsule, Take 1 capsule (40 mg total) by mouth daily., Disp: 90 capsule, Rfl: 3 .  icosapent Ethyl (VASCEPA) 1 g capsule, Take 2 capsules (2 g total) by mouth 2 (two) times daily., Disp: 120 capsule, Rfl: 11 .  Insulin Pen Needle (GLOBAL EASE INJECT PEN NEEDLES) 31G X 5 MM MISC, USE ONCE EVERY DAY, Disp: 100 each, Rfl: 3 .  liraglutide (VICTOZA) 18 MG/3ML SOPN, Inject 0.2 mLs (1.2 mg total) into the skin daily., Disp: 12 mL, Rfl: 3 .  losartan  (COZAAR) 25 MG tablet, Take 1 tablet (25 mg total) by mouth daily., Disp: 90 tablet, Rfl: 3 .  medroxyPROGESTERone (PROVERA) 5 MG tablet, Take 1 tablet (5 mg total) by mouth daily., Disp: 90 tablet, Rfl: 1 .  metFORMIN (GLUCOPHAGE) 1000 MG tablet, TAKE 1 TABLET BY MOUTH TWICE DAILY (Patient taking differently: Take 1,000 mg by mouth 2 (two) times daily with a meal. ), Disp: 60 tablet, Rfl: 5 .  nitroGLYCERIN (NITROSTAT) 0.4 MG SL tablet, DISSOLVE 1 TABLET UNDER THE TONGUE EVERY 5 MINUTES AS NEEDED FOR CHEST PAIN. DO NOT EXCEED A TOTAL OF 3 DOSES IN 15 MINUTES. (Patient taking differently: Place 0.4 mg under the tongue every 5 (five) minutes as needed for chest pain. ), Disp: 25 tablet, Rfl: 3 .  pantoprazole (PROTONIX) 40 MG tablet, Take 1 tablet (40 mg total) by mouth daily., Disp: 30 tablet, Rfl: 3 .  potassium chloride SA (KLOR-CON) 20 MEQ tablet, TAKE 1 TABLET BY MOUTH EVERY DAY (Patient taking differently: Take 20 mEq by mouth every other day. ), Disp: 90 tablet, Rfl: 3 .  pregabalin (LYRICA) 100 MG capsule, Take 1 capsule (100 mg total) by mouth 2 (two) times daily., Disp: 60 capsule, Rfl: 5 .  ranolazine (RANEXA) 1000 MG SR tablet, Take 1 tablet (1,000 mg total) by mouth 2 (two) times daily., Disp: 180 tablet, Rfl: 3 .  rosuvastatin (CRESTOR) 40  MG tablet, TAKE 1 TABLET BY MOUTH EVERY DAY (Patient taking differently: TAKE 1 TABLET BY MOUTH EVERY DAY), Disp: 90 tablet, Rfl: 3 .  torsemide (DEMADEX) 20 MG tablet, Take 40 mg by mouth daily as needed (swelling). , Disp: , Rfl:  .  traMADol (ULTRAM) 50 MG tablet, Take 1 tablet (50 mg total) by mouth every 12 (twelve) hours as needed for severe pain (ONLY IF NEEDED)., Disp: 60 tablet, Rfl: 1  Past Medical History: Past Medical History:  Diagnosis Date  . Allergy   . Anemia   . Anxiety   . CHF (congestive heart failure) ([place])   . Chronic kidney disease    history of kidney stones  . Coronary artery disease 09/2011   a.s/p BMS to LAD and  angioplasty of RI in 09/2011 b. patent stent by cath in 02/2012, 12/2013, and 02/2016 with most recent showing 30-40% dLAD and 30-40% RCA stenosis  . Depression   . Diabetes mellitus   . Hyperlipidemia   . Hypertension   . Midsternal chest pain 03/03/2012  . Migraines   . Morbid obesity ([place])   . Myocardial infarct ([place]) 09/2011  . Myocardial infarction ([place]) 02/2016  . Palpitations 02/17/2012  . Sleep apnea   . Ureteral stone with hydronephrosis   . Urinary tract obstruction due to kidney stone 02/15/2017    Tobacco Use: Social History   Tobacco Use  Smoking Status Current Every Day Smoker  . Packs/day: 0.50  . Years: 20.00  . Pack years: 10.00  . Types: Cigarettes  . Start date: 03/18/1990  Smokeless Tobacco Never Used    Labs: Recent Review Flowsheet Data    Labs for ITP Cardiac and Pulmonary Rehab Latest Ref Rng & Units 06/05/2019 10/09/2019 01/13/2020 01/14/2020 04/08/2020   Cholestrol 0 - 200 mg/dL 213(H) 127 - 107 -   LDLCALC 0 - 99 mg/dL 141(H) 66 - 59 -   HDL >40 mg/dL 30(L) 32(L) - 27(L) -   Trlycerides <150 mg/dL 232(H) 171(H) - 105 -   Hemoglobin A1c <7.0 % 7.3(H) 6.1 6.1(H) - 5.4   TCO2 0 - 100 mmol/L - - - - -      Capillary Blood Glucose: Lab Results  Component Value Date   GLUCAP 112 (H) 03/19/2020   GLUCAP 222 (H) 01/15/2020   GLUCAP 109 (H) 01/15/2020   GLUCAP 93 01/14/2020   GLUCAP 113 (H) 01/14/2020    POCT Glucose    Row Name 03/24/20 1120             POCT Blood Glucose   Pre-Exercise 119 mg/dL              Exercise Target Goals: Exercise Program Goal: Individual exercise prescription set using results from initial 6 min walk test and THRR while considering  patient's activity barriers and safety.   Exercise Prescription Goal: Starting with aerobic activity 30 plus minutes a day, 3 days per week for initial exercise prescription. Provide home exercise prescription and guidelines that participant acknowledges understanding prior to  discharge.  Activity Barriers & Risk Stratification:  Activity Barriers & Cardiac Risk Stratification - 03/19/20 1407      Activity Barriers & Cardiac Risk Stratification   Activity Barriers Deconditioning;Shortness of Breath;Other (comment)    Comments Diabetic neuropathy in both legs    Cardiac Risk Stratification High           6 Minute Walk:  6 Minute Walk    Row Name 03/19/20 1352  6 Minute Walk   Phase Initial     Distance 600 feet     Walk Time 6 minutes     # of Rest Breaks 1     MPH 1.13     METS 1.57     RPE 13     Perceived Dyspnea  15     VO2 Peak 5.52     Symptoms Yes (comment)     Comments Took one seated rest break due to SOB and L leg fatigue     Resting HR 94 bpm     Resting BP 90/60     Resting Oxygen Saturation  98 %     Exercise Oxygen Saturation  during 6 min walk 97 %     Max Ex. HR 114 bpm     Max Ex. BP 112/64     2 Minute Post BP 98/66            Oxygen Initial Assessment:   Oxygen Re-Evaluation:   Oxygen Discharge (Final Oxygen Re-Evaluation):   Initial Exercise Prescription:  Initial Exercise Prescription - 03/19/20 1300      Date of Initial Exercise RX and Referring Provider   Date 03/19/20    Referring Provider Dr. Harl Bowie    Expected Discharge Date 06/19/20      Arm Ergometer   Level 1    Watts 42.3    Minutes 22      T5 Nustep   Level 1    SPM 80    Minutes 17      Prescription Details   Frequency (times per week) 3    Duration Progress to 30 minutes of continuous aerobic without signs/symptoms of physical distress      Intensity   THRR 40-80% of Max Heartrate [id number]    Ratings of Perceived Exertion 11-13    Perceived Dyspnea 0-4      Progression   Progression Continue progressive overload as per policy without signs/symptoms or physical distress.      Resistance Training   Training Prescription Yes    Weight 1    Reps 10-15           Perform Capillary Blood Glucose checks as  needed.  Exercise Prescription Changes:   Exercise Prescription Changes    Row Name 03/24/20 1100 04/02/20 1512 04/18/20 1228         Response to Exercise   Blood Pressure (Admit) 104/60 110/64 120/86     Blood Pressure (Exercise) 112/64 100/60 132/68     Blood Pressure (Exit) 112/74 102/60 110/78     Heart Rate (Admit) 90 bpm 80 bpm 86 bpm     Heart Rate (Exercise) 110 bpm 99 bpm 117 bpm     Heart Rate (Exit) 93 bpm 89 bpm 95 bpm     Rating of Perceived Exertion (Exercise) 13 12 13      Duration Continue with 30 min of aerobic exercise without signs/symptoms of physical distress. Continue with 30 min of aerobic exercise without signs/symptoms of physical distress. Continue with 30 min of aerobic exercise without signs/symptoms of physical distress.     Intensity THRR unchanged THRR unchanged THRR unchanged       Progression   Progression Continue to progress workloads to maintain intensity without signs/symptoms of physical distress. Continue to progress workloads to maintain intensity without signs/symptoms of physical distress. Continue to progress workloads to maintain intensity without signs/symptoms of physical distress.       Horticulturist, commercial  Prescription Yes Yes Yes     Weight 1 1 2      Reps 10-15 10-15 10-15       Arm Ergometer   Level 1 1.2 1.2     Watts 13 14 17      Minutes 17 17 22      METs 1.8 1.9 2.2       T5 Nustep   Level 1 1 2      SPM 96 103 78     Minutes 22 22 17      METs 3.1 2.7 1.9            Exercise Comments:   Exercise Goals and Review:   Exercise Goals    Row Name 03/19/20 1435 04/28/20 1419           Exercise Goals   Increase Physical Activity Yes Yes      Intervention Provide advice, education, support and counseling about physical activity/exercise needs.;Develop an individualized exercise prescription for aerobic and resistive training based on initial evaluation findings, risk stratification, comorbidities and  participant's personal goals. Provide advice, education, support and counseling about physical activity/exercise needs.;Develop an individualized exercise prescription for aerobic and resistive training based on initial evaluation findings, risk stratification, comorbidities and participant's personal goals.      Expected Outcomes Short Term: Attend rehab on a regular basis to increase amount of physical activity.;Long Term: Add in home exercise to make exercise part of routine and to increase amount of physical activity.;Long Term: Exercising regularly at least 3-5 days a week. Short Term: Attend rehab on a regular basis to increase amount of physical activity.;Long Term: Add in home exercise to make exercise part of routine and to increase amount of physical activity.;Long Term: Exercising regularly at least 3-5 days a week.      Increase Strength and Stamina Yes Yes      Intervention Provide advice, education, support and counseling about physical activity/exercise needs.;Develop an individualized exercise prescription for aerobic and resistive training based on initial evaluation findings, risk stratification, comorbidities and participant's personal goals. Provide advice, education, support and counseling about physical activity/exercise needs.;Develop an individualized exercise prescription for aerobic and resistive training based on initial evaluation findings, risk stratification, comorbidities and participant's personal goals.      Expected Outcomes Short Term: Increase workloads from initial exercise prescription for resistance, speed, and METs.;Short Term: Perform resistance training exercises routinely during rehab and add in resistance training at home;Long Term: Improve cardiorespiratory fitness, muscular endurance and strength as measured by increased METs and functional capacity (6MWT) Short Term: Increase workloads from initial exercise prescription for resistance, speed, and METs.;Short Term:  Perform resistance training exercises routinely during rehab and add in resistance training at home;Long Term: Improve cardiorespiratory fitness, muscular endurance and strength as measured by increased METs and functional capacity (6MWT)      Able to understand and use rate of perceived exertion (RPE) scale Yes Yes      Intervention Provide education and explanation on how to use RPE scale Provide education and explanation on how to use RPE scale      Expected Outcomes Short Term: Able to use RPE daily in rehab to express subjective intensity level;Long Term:  Able to use RPE to guide intensity level when exercising independently Short Term: Able to use RPE daily in rehab to express subjective intensity level;Long Term:  Able to use RPE to guide intensity level when exercising independently      Able to understand and use Dyspnea scale Yes --  Intervention Provide education and explanation on how to use Dyspnea scale --      Expected Outcomes Short Term: Able to use Dyspnea scale daily in rehab to express subjective sense of shortness of breath during exertion;Long Term: Able to use Dyspnea scale to guide intensity level when exercising independently --      Knowledge and understanding of Target Heart Rate Range (THRR) Yes Yes      Intervention Provide education and explanation of THRR including how the numbers were predicted and where they are located for reference Provide education and explanation of THRR including how the numbers were predicted and where they are located for reference      Expected Outcomes Short Term: Able to state/look up THRR;Short Term: Able to use daily as guideline for intensity in rehab;Long Term: Able to use THRR to govern intensity when exercising independently Short Term: Able to state/look up THRR;Short Term: Able to use daily as guideline for intensity in rehab;Long Term: Able to use THRR to govern intensity when exercising independently      Able to check pulse  independently Yes Yes      Intervention Provide education and demonstration on how to check pulse in carotid and radial arteries.;Review the importance of being able to check your own pulse for safety during independent exercise Provide education and demonstration on how to check pulse in carotid and radial arteries.;Review the importance of being able to check your own pulse for safety during independent exercise      Expected Outcomes Short Term: Able to explain why pulse checking is important during independent exercise;Long Term: Able to check pulse independently and accurately Short Term: Able to explain why pulse checking is important during independent exercise;Long Term: Able to check pulse independently and accurately      Understanding of Exercise Prescription Yes Yes      Intervention Provide education, explanation, and written materials on patient's individual exercise prescription Provide education, explanation, and written materials on patient's individual exercise prescription      Expected Outcomes Short Term: Able to explain program exercise prescription Short Term: Able to explain program exercise prescription             Exercise Goals Re-Evaluation :  Exercise Goals Re-Evaluation    Row Name 04/28/20 1419             Exercise Goal Re-Evaluation   Exercise Goals Review Increase Physical Activity;Increase Strength and Stamina;Able to understand and use rate of perceived exertion (RPE) scale;Knowledge and understanding of Target Heart Rate Range (THRR);Able to check pulse independently;Understanding of Exercise Prescription       Comments Pt has completed 9 exercise sessions. She has been able to increase her workloads and has been working through her hip/back pain. She has a positive outlook. She is currently exercising at 3.8 METs on the stepper. Will continue to monitor and progress as able.       Expected Outcomes Pt will reach her goal of losing weight and will begin to  exercise at home.               Discharge Exercise Prescription (Final Exercise Prescription Changes):  Exercise Prescription Changes - 04/18/20 1228      Response to Exercise   Blood Pressure (Admit) 120/86    Blood Pressure (Exercise) 132/68    Blood Pressure (Exit) 110/78    Heart Rate (Admit) 86 bpm    Heart Rate (Exercise) 117 bpm    Heart Rate (Exit) 95 bpm  Rating of Perceived Exertion (Exercise) 13    Duration Continue with 30 min of aerobic exercise without signs/symptoms of physical distress.    Intensity THRR unchanged      Progression   Progression Continue to progress workloads to maintain intensity without signs/symptoms of physical distress.      Resistance Training   Training Prescription Yes    Weight 2    Reps 10-15      Arm Ergometer   Level 1.2    Watts 17    Minutes 22    METs 2.2      T5 Nustep   Level 2    SPM 78    Minutes 17    METs 1.9           Nutrition:  Target Goals: Understanding of nutrition guidelines, daily intake of sodium <1550m, cholesterol <2056m calories 30% from fat and 7% or less from saturated fats, daily to have 5 or more servings of fruits and vegetables.  Biometrics:  Pre Biometrics - 03/19/20 1433      Pre Biometrics   Height 5' 10"  (1.778 m)    Weight 172.6 kg    Waist Circumference 64 inches    Hip Circumference 62 inches    Waist to Hip Ratio 1.03 %    BMI (Calculated) 54.61    Triceps Skinfold 32 mm    % Body Fat 61.1 %    Grip Strength 27.3 kg    Flexibility 0 in    Single Leg Stand 4.47 seconds            Nutrition Therapy Plan and Nutrition Goals:  Nutrition Therapy & Goals - 04/30/20 0825      Personal Nutrition Goals   Comments Patient continues to say she follows a diabetic heart healthy diet. Will continue to monitor.      Intervention Plan   Intervention Nutrition handout(s) given to patient.           Nutrition Assessments:  Nutrition Assessments - 03/19/20 1448       MEDFICTS Scores   Pre Score 3           Nutrition Goals Re-Evaluation:   Nutrition Goals Discharge (Final Nutrition Goals Re-Evaluation):   Psychosocial: Target Goals: Acknowledge presence or absence of significant depression and/or stress, maximize coping skills, provide positive support system. Participant is able to verbalize types and ability to use techniques and skills needed for reducing stress and depression.  Initial Review & Psychosocial Screening:  Initial Psych Review & Screening - 03/19/20 1443      Initial Review   Current issues with Current Depression;History of Depression;Current Anxiety/Panic      Family Dynamics   Good Support System? Yes    Comments Patient reports having good family support.      Barriers   Psychosocial barriers to participate in program Psychosocial barriers identified (see note)      Screening Interventions   Interventions Encouraged to exercise;Provide feedback about the scores to participant;To provide support and resources with identified psychosocial needs    Expected Outcomes Long Term Goal: Stressors or current issues are controlled or eliminated.;Short Term goal: Identification and review with participant of any Quality of Life or Depression concerns found by scoring the questionnaire.;Long Term goal: The participant improves quality of Life and PHQ9 Scores as seen by post scores and/or verbalization of changes           Quality of Life Scores:  Quality of Life - 03/19/20 1315  Quality of Life   Select Quality of Life      Quality of Life Scores   Health/Function Pre 18 %    Socioeconomic Pre 22.75 %    Psych/Spiritual Pre 22.5 %    Family Pre 30 %    GLOBAL Pre 21.7 %          Scores of 19 and below usually indicate a poorer quality of life in these areas.  A difference of  2-3 points is a clinically meaningful difference.  A difference of 2-3 points in the total score of the Quality of Life Index has been  associated with significant improvement in overall quality of life, self-image, physical symptoms, and general health in studies assessing change in quality of life.  PHQ-9: Recent Review Flowsheet Data    Depression screen [facility] 2/9 04/08/2020 03/19/2020 10/09/2019 06/05/2019 10/04/2018   Decreased Interest 0 1 0 0 0   Down, Depressed, Hopeless 0 0 1 0 0   PHQ - 2 Score 0 1 1 0 0   Altered sleeping 0 2 1 0 0   Tired, decreased energy 0 1 1 0 0   Change in appetite 0 1 0 0 0   Feeling bad or failure about yourself  0 0 0 0 0   Trouble concentrating 0 1 0 0 0   Moving slowly or fidgety/restless 0 0 0 0 0   Suicidal thoughts 0 0 0 0 0   PHQ-9 Score 0 6 3 0 0   Difficult doing work/chores - Not difficult at all Not difficult at all - Not difficult at all     Interpretation of Total Score  Total Score Depression Severity:  1-4 = Minimal depression, 5-9 = Mild depression, 10-14 = Moderate depression, 15-19 = Moderately severe depression, 20-27 = Severe depression   Psychosocial Evaluation and Intervention:  Psychosocial Evaluation - 03/19/20 1444      Psychosocial Evaluation & Interventions   Interventions Encouraged to exercise with the program and follow exercise prescription;Stress management education;Relaxation education    Comments Patient's initial QOL score was 21.7 % and her PHQ-9 score was 6. She is currently on Fluoxetine 40 mg daily to treat Depression and Anxiety. She says her depression is mainly due to her health and obesity and she gets anxious in crowds. She feels her depression and anxiety are managed on Fluoxetine. Will continue to monitor for progress.    Expected Outcomes Patient's depression and anxiety will be managed at discharge with no additional psychosocial issues identified.    Continue Psychosocial Services  Follow up required by staff           Psychosocial Re-Evaluation:  Psychosocial Re-Evaluation    [place] Name 04/30/20 0830             Psychosocial  Re-Evaluation   Current issues with Current Depression;History of Depression;Current Anxiety/Panic       Comments Patient's initial QOL score was 21.79 and her PHQ-9 score was 6. She continues to feel like her depression/anxiety is managed with Fluoxetine 50 mg daily. Will continue to monitor.       Expected Outcomes Patient's depression and anxiety will be managed and controlled at discharge with no additional psychosocial issues identifed.       Interventions Stress management education;Encouraged to attend Cardiac Rehabilitation for the exercise;Relaxation education       Continue Psychosocial Services  Follow up required by counselor  Psychosocial Discharge (Final Psychosocial Re-Evaluation):  Psychosocial Re-Evaluation - 04/30/20 0830      Psychosocial Re-Evaluation   Current issues with Current Depression;History of Depression;Current Anxiety/Panic    Comments Patient's initial QOL score was 21.79 and her PHQ-9 score was 6. She continues to feel like her depression/anxiety is managed with Fluoxetine 50 mg daily. Will continue to monitor.    Expected Outcomes Patient's depression and anxiety will be managed and controlled at discharge with no additional psychosocial issues identifed.    Interventions Stress management education;Encouraged to attend Cardiac Rehabilitation for the exercise;Relaxation education    Continue Psychosocial Services  Follow up required by counselor           Vocational Rehabilitation: Provide vocational rehab assistance to qualifying candidates.   Vocational Rehab Evaluation & Intervention:  Vocational Rehab - 03/19/20 1448      Initial Vocational Rehab Evaluation & Intervention   Assessment shows need for Vocational Rehabilitation No      Vocational Rehab Re-Evaulation   Comments Patient is unemployed and is currently seeking to get on disablilty.           Education: Education Goals: Education classes will be provided on a weekly  basis, covering required topics. Participant will state understanding/return demonstration of topics presented.  Learning Barriers/Preferences:  Learning Barriers/Preferences - 03/19/20 1448      Learning Barriers/Preferences   Learning Barriers None    Learning Preferences Skilled Demonstration           Education Topics: Hypertension, Hypertension Reduction -Define heart disease and high blood pressure. Discus how high blood pressure affects the body and ways to reduce high blood pressure.   CARDIAC REHAB PHASE II EXERCISE from 04/23/2020 in [place]  Date 04/02/20  Educator DF  Instruction Review Code 2- Demonstrated Understanding      Exercise and Your Heart -Discuss why it is important to exercise, the FITT principles of exercise, normal and abnormal responses to exercise, and how to exercise safely.   CARDIAC REHAB PHASE II EXERCISE from 04/23/2020 in [place]  Date 04/09/20  Educator Etheleen Mayhew  Instruction Review Code 1- Verbalizes Understanding      Angina -Discuss definition of angina, causes of angina, treatment of angina, and how to decrease risk of having angina.   CARDIAC REHAB PHASE II EXERCISE from 04/23/2020 in [place]  Date 04/16/20  Educator DF  Instruction Review Code 1- Verbalizes Understanding      Cardiac Medications -Review what the following cardiac medications are used for, how they affect the body, and side effects that may occur when taking the medications.  Medications include Aspirin, Beta blockers, calcium channel blockers, ACE Inhibitors, angiotensin receptor blockers, diuretics, digoxin, and antihyperlipidemics.   CARDIAC REHAB PHASE II EXERCISE from 04/23/2020 in [place]  Date 04/23/20  Educator DF  Instruction Review Code 1- Verbalizes Understanding      Congestive Heart Failure -Discuss the definition of CHF, how to live with CHF, the  signs and symptoms of CHF, and how keep track of weight and sodium intake.   Heart Disease and Intimacy -Discus the effect sexual activity has on the heart, how changes occur during intimacy as we age, and safety during sexual activity.   Smoking Cessation / COPD -Discuss different methods to quit smoking, the health benefits of quitting smoking, and the definition of COPD.   Nutrition I: Fats -Discuss the types of cholesterol, what cholesterol does to the heart, and  how cholesterol levels can be controlled.   Nutrition II: Labels -Discuss the different components of food labels and how to read food label   Heart Parts/Heart Disease and PAD -Discuss the anatomy of the heart, the pathway of blood circulation through the heart, and these are affected by heart disease.   Stress I: Signs and Symptoms -Discuss the causes of stress, how stress may lead to anxiety and depression, and ways to limit stress.   Stress II: Relaxation -Discuss different types of relaxation techniques to limit stress.   Warning Signs of Stroke / TIA -Discuss definition of a stroke, what the signs and symptoms are of a stroke, and how to identify when someone is having stroke.   CARDIAC REHAB PHASE II EXERCISE from 04/23/2020 in [place]  Date 03/26/20  Educator Etheleen Mayhew  Instruction Review Code 1- Verbalizes Understanding      Knowledge Questionnaire Score:  Knowledge Questionnaire Score - 03/19/20 1448      Knowledge Questionnaire Score   Pre Score 26/28           Core Components/Risk Factors/Patient Goals at Admission:  Personal Goals and Risk Factors at Admission - 03/19/20 1449      Core Components/Risk Factors/Patient Goals on Admission    Weight Management Obesity;Yes    Intervention Weight Management: Develop a combined nutrition and exercise program designed to reach desired caloric intake, while maintaining appropriate intake of nutrient and fiber, sodium  and fats, and appropriate energy expenditure required for the weight goal.;Weight Management: Provide education and appropriate resources to help participant work on and attain dietary goals.;Weight Management/Obesity: Establish reasonable short term and long term weight goals.;Obesity: Provide education and appropriate resources to help participant work on and attain dietary goals.    Admit Weight 280 lb 9.6 oz (127.3 kg)    Goal Weight: Short Term 273 lb 1.6 oz (123.9 kg)    Goal Weight: Long Term 265 lb 9.6 oz (120.5 kg)    Expected Outcomes Weight Maintenance: Understanding of the daily nutrition guidelines, which includes 25-35% calories from fat, 7% or less cal from saturated fats, less than 226m cholesterol, less than 1.5gm of sodium, & 5 or more servings of fruits and vegetables daily;Short Term: Continue to assess and modify interventions until short term weight is achieved    Tobacco Cessation Yes    Number of packs per day 6-7 cigerattes/day    Intervention Assist the participant in steps to quit. Provide individualized education and counseling about committing to Tobacco Cessation, relapse prevention, and pharmacological support that can be provided by physician.;OAdvice worker assist with locating and accessing local/national Quit Smoking programs, and support quit date choice.    Expected Outcomes Short Term: Will quit all tobacco product use, adhering to prevention of relapse plan.    Diabetes Yes    Intervention Provide education about signs/symptoms and action to take for hypo/hyperglycemia.;Provide education about proper nutrition, including hydration, and aerobic/resistive exercise prescription along with prescribed medications to achieve blood glucose in normal ranges: Fasting glucose 65-99 mg/dL    Expected Outcomes Short Term: Participant verbalizes understanding of the signs/symptoms and immediate care of hyper/hypoglycemia, proper foot care and importance of  medication, aerobic/resistive exercise and nutrition plan for blood glucose control.    Personal Goal Other Yes    Personal Goal Lose 15 lbs in program and continue to lose weight. Be able to walk 50 feet without getting SOB or fatigued.    Intervention Patient will participate in CR  3 days/week and supplement with exercise at home 2 days/week.    Expected Outcomes Patient will complete the program and meet both program and personal goals.           Core Components/Risk Factors/Patient Goals Review:   Goals and Risk Factor Review    Row Name 04/02/20 0826 04/30/20 0826           Core Components/Risk Factors/Patient Goals Review   Personal Goals Review Tobacco Cessation;Weight Management/Obesity;Other  Lose 15 lbs. Walk 50 ft without having to stop for SOB or fatigue. Tobacco Cessation;Weight Management/Obesity;Other;Diabetes  Lose 15 lbs; walk 50 ft w/o having to stop for SOB or fatigue.      Review Patient is new to the program completing 3 sessions. Will continue to monitor as she works toward meeting both program and personal goals. Patient has completed 10 sessions losing 2 lbs since her initial visit. She is doing well in the program but her attendance has not been consistent. 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 She continues to smoke 6 to 7 cigerattes/day. Her last A1C was 12/2019 at 6.1 mg/dl. Her reports glucose readings are usually WNL. She says she feels like the program is helping her. She is Luk[place] not able to walk very long distance without having to stop due to SOB and fatigue. She hopes this will improve as she continues to come. Will continue to monitor for progress.      Expected Outcomes Patient will continue to attend sessions and complete the program meeting both program and personal goals. Patient will continue to attend sessions and complete the program meeting both program and personal goals.              Core Components/Risk Factors/Patient Goals at Discharge (Final Review):   Goals and Risk Factor Review - 04/30/20 0826      Core Components/Risk Factors/Patient Goals Review   Personal Goals Review Tobacco Cessation;Weight Management/Obesity;Other;Diabetes   Lose 15 lbs; walk 50 ft w/o having to stop for SOB or fatigue.   Review Patient has completed 10 sessions losing 2 lbs since her initial visit. She is doing well in the program but her attendance has not been consistent. 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 She continues to smoke 6 to 7 cigerattes/day. Her last A1C was 12/2019 at 6.1 mg/dl. Her reports glucose readings are usually WNL. She says she feels like the program is helping her. She is Dit[place] not able to walk very long distance without having to stop due to SOB and fatigue. She hopes this will improve as she continues to come. Will continue to monitor for progress.    Expected Outcomes Patient will continue to attend sessions and complete the program meeting both program and personal goals.           ITP Comments:   Comments: ITP REVIEW Pt is making expected progress toward Cardiac Rehab goals after completing 10 sessions. Recommend continued exercise, life style modification, education, and increased stamina and strength.

## 2020-05-02 ENCOUNTER — Encounter (HOSPITAL_COMMUNITY)
Admission: RE | Admit: 2020-05-02 | Discharge: 2020-05-02 | Disposition: A | Discharge status: Home / Self Care | Payer: Medicaid Other | Source: Ambulatory Visit | Attending: Cardiology | Admitting: Cardiology

## 2020-05-02 ENCOUNTER — Encounter: Payer: Self-pay | Admitting: Internal Medicine

## 2020-05-02 ENCOUNTER — Other Ambulatory Visit: Payer: Self-pay | Admitting: Family Medicine

## 2020-05-02 ENCOUNTER — Other Ambulatory Visit: Payer: Self-pay

## 2020-05-02 DIAGNOSIS — I214 Non-ST elevation (NSTEMI) myocardial infarction: Secondary | ICD-10-CM | POA: Diagnosis not present

## 2020-05-02 DIAGNOSIS — Z955 Presence of coronary angioplasty implant and graft: Secondary | ICD-10-CM

## 2020-05-02 NOTE — Progress Notes (Signed)
I have reviewed a Home Exercise Prescription with [name] . [name] is currently exercising at home.  The patient was advised to walk 2-3 days a week for 30-45 minutes.  [name] and I discussed how to progress their exercise prescription.  The patient stated that their goals were lose 15lbs in program and walk 65ft without SOB or fatigue.  The patient stated that they understand the exercise prescription.  We reviewed exercise guidelines, target heart rate during exercise, RPE Scale, weather conditions, NTG use, endpoints for exercise, warmup and cool down.  Patient is encouraged to come to me with any questions. I will continue to follow up with the patient to assist them with progression and safety.

## 2020-05-02 NOTE — Progress Notes (Signed)
Daily Session Note  Patient Details  Name: [name] MRN: [id number] Date of Birth: 21-Apr-1981 Referring Provider:     CARDIAC REHAB PHASE II ORIENTATION from 03/19/2020 in [place]  Referring Provider Dr. Harl Bowie      Encounter Date: 05/02/2020  Check In:  Session Check In - 05/02/20 0837      Check-In   Supervising physician immediately available to respond to emergencies See telemetry face sheet for immediately available MD    Location [facility]    Staff Present Algis Downs, Exercise Physiologist;Aayra Hornbaker Kris Mouton, MS, ACSM-CEP, Exercise Physiologist;Carlette Wilber Oliphant, RN, BSN    Virtual Visit No    Medication changes reported     No    Fall or balance concerns reported    No    Tobacco Cessation No Change    Current number of cigarettes/nicotine per day     7    Warm-up and Cool-down Performed as group-led instruction    Resistance Training Performed Yes    VAD Patient? No    PAD/SET Patient? No      Pain Assessment   Currently in Pain? No/denies    Pain Score 0-No pain    Multiple Pain Sites No           Capillary Blood Glucose: No results found for this or any previous visit (from the past 24 hour(s)).    Social History   Tobacco Use  Smoking Status Current Every Day Smoker   Packs/day: 0.50   Years: 20.00   Pack years: 10.00   Types: Cigarettes   Start date: 03/18/1990  Smokeless Tobacco Never Used    Goals Met:  Independence with exercise equipment Exercise tolerated well No report of cardiac concerns or symptoms Strength training completed today  Goals Unmet:  Not Applicable  Comments: checkout time is 0915   Dr. Kathie Dike is Medical Director for [facility]monary [facility].

## 2020-05-05 ENCOUNTER — Encounter (HOSPITAL_COMMUNITY): Payer: Medicaid Other

## 2020-05-07 ENCOUNTER — Encounter (HOSPITAL_COMMUNITY)
Admission: RE | Admit: 2020-05-07 | Discharge: 2020-05-07 | Disposition: A | Discharge status: Home / Self Care | Payer: Medicaid Other | Source: Ambulatory Visit | Attending: Cardiology | Admitting: Cardiology

## 2020-05-07 ENCOUNTER — Other Ambulatory Visit: Payer: Self-pay

## 2020-05-07 DIAGNOSIS — I214 Non-ST elevation (NSTEMI) myocardial infarction: Secondary | ICD-10-CM

## 2020-05-07 DIAGNOSIS — Z955 Presence of coronary angioplasty implant and graft: Secondary | ICD-10-CM

## 2020-05-07 NOTE — Progress Notes (Signed)
Daily Session Note  Patient Details  Name: [name] MRN: [id number] Date of Birth: 04-08-1981 Referring Provider:     CARDIAC REHAB PHASE II ORIENTATION from 03/19/2020 in [place]  Referring Provider Dr. Harl Bowie      Encounter Date: 05/07/2020  Check In:  Session Check In - 05/07/20 0829      Check-In   Supervising physician immediately available to respond to emergencies See telemetry face sheet for immediately available MD    Location [facility]    Staff Present Algis Downs, Exercise Physiologist;Ulisses Vondrak Kris Mouton, MS, ACSM-CEP, Exercise Physiologist;Carlette Wilber Oliphant, RN, BSN    Virtual Visit No    Medication changes reported     No    Fall or balance concerns reported    No    Tobacco Cessation No Change    Current number of cigarettes/nicotine per day     7    Warm-up and Cool-down Performed as group-led instruction    Resistance Training Performed Yes    VAD Patient? No    PAD/SET Patient? No      Pain Assessment   Currently in Pain? No/denies    Pain Score 0-No pain    Multiple Pain Sites No           Capillary Blood Glucose: No results found for this or any previous visit (from the past 24 hour(s)).    Social History   Tobacco Use  Smoking Status Current Every Day Smoker  . Packs/day: 0.50  . Years: 20.00  . Pack years: 10.00  . Types: Cigarettes  . Start date: 03/18/1990  Smokeless Tobacco Never Used    Goals Met:  Independence with exercise equipment Exercise tolerated well No report of cardiac concerns or symptoms Strength training completed today  Goals Unmet:  Not Applicable  Comments: checkout time is 0915   Dr. Kathie Dike is Medical Director for [facility] Pulmonary Rehab.

## 2020-05-09 ENCOUNTER — Encounter (HOSPITAL_COMMUNITY): Payer: Medicaid Other

## 2020-05-12 ENCOUNTER — Encounter (HOSPITAL_COMMUNITY): Payer: Medicaid Other

## 2020-05-14 ENCOUNTER — Encounter (HOSPITAL_COMMUNITY): Payer: Medicaid Other

## 2020-05-16 ENCOUNTER — Encounter (HOSPITAL_COMMUNITY): Payer: Medicaid Other

## 2020-05-18 ENCOUNTER — Emergency Department (HOSPITAL_COMMUNITY): Payer: Medicaid Other

## 2020-05-18 ENCOUNTER — Other Ambulatory Visit: Payer: Self-pay

## 2020-05-18 ENCOUNTER — Encounter (HOSPITAL_COMMUNITY): Payer: Self-pay | Admitting: *Deleted

## 2020-05-18 ENCOUNTER — Emergency Department (HOSPITAL_COMMUNITY)
Admission: EM | Admit: 2020-05-18 | Discharge: 2020-05-18 | Disposition: A | Discharge status: Home / Self Care | Payer: Medicaid Other | Attending: Emergency Medicine | Admitting: Emergency Medicine

## 2020-05-18 DIAGNOSIS — E119 Type 2 diabetes mellitus without complications: Secondary | ICD-10-CM | POA: Diagnosis not present

## 2020-05-18 DIAGNOSIS — F17210 Nicotine dependence, cigarettes, uncomplicated: Secondary | ICD-10-CM | POA: Diagnosis not present

## 2020-05-18 DIAGNOSIS — Y939 Activity, unspecified: Secondary | ICD-10-CM | POA: Insufficient documentation

## 2020-05-18 DIAGNOSIS — Z79899 Other long term (current) drug therapy: Secondary | ICD-10-CM | POA: Insufficient documentation

## 2020-05-18 DIAGNOSIS — I130 Hypertensive heart and chronic kidney disease with heart failure and stage 1 through stage 4 chronic kidney disease, or unspecified chronic kidney disease: Secondary | ICD-10-CM | POA: Diagnosis not present

## 2020-05-18 DIAGNOSIS — R200 Anesthesia of skin: Secondary | ICD-10-CM | POA: Insufficient documentation

## 2020-05-18 DIAGNOSIS — Z91040 Latex allergy status: Secondary | ICD-10-CM | POA: Insufficient documentation

## 2020-05-18 DIAGNOSIS — M545 Low back pain: Secondary | ICD-10-CM | POA: Insufficient documentation

## 2020-05-18 DIAGNOSIS — Y999 Unspecified external cause status: Secondary | ICD-10-CM | POA: Diagnosis not present

## 2020-05-18 DIAGNOSIS — Z7984 Long term (current) use of oral hypoglycemic drugs: Secondary | ICD-10-CM | POA: Diagnosis not present

## 2020-05-18 DIAGNOSIS — R079 Chest pain, unspecified: Secondary | ICD-10-CM | POA: Diagnosis not present

## 2020-05-18 DIAGNOSIS — Z7982 Long term (current) use of aspirin: Secondary | ICD-10-CM | POA: Insufficient documentation

## 2020-05-18 DIAGNOSIS — N189 Chronic kidney disease, unspecified: Secondary | ICD-10-CM | POA: Insufficient documentation

## 2020-05-18 DIAGNOSIS — I5032 Chronic diastolic (congestive) heart failure: Secondary | ICD-10-CM | POA: Insufficient documentation

## 2020-05-18 DIAGNOSIS — Y929 Unspecified place or not applicable: Secondary | ICD-10-CM | POA: Insufficient documentation

## 2020-05-18 DIAGNOSIS — R109 Unspecified abdominal pain: Secondary | ICD-10-CM | POA: Diagnosis not present

## 2020-05-18 DIAGNOSIS — M549 Dorsalgia, unspecified: Secondary | ICD-10-CM | POA: Diagnosis not present

## 2020-05-18 DIAGNOSIS — S3991XA Unspecified injury of abdomen, initial encounter: Secondary | ICD-10-CM | POA: Diagnosis not present

## 2020-05-18 DIAGNOSIS — S299XXA Unspecified injury of thorax, initial encounter: Secondary | ICD-10-CM | POA: Diagnosis not present

## 2020-05-18 LAB — CBC WITH DIFFERENTIAL/PLATELET
Abs Immature Granulocytes: 0.04 10*3/uL (ref 0.00–0.07)
Basophils Absolute: 0 10*3/uL (ref 0.0–0.1)
Basophils Relative: 0 %
Eosinophils Absolute: 0.1 10*3/uL (ref 0.0–0.5)
Eosinophils Relative: 1 %
HCT: 38.3 % (ref 36.0–46.0)
Hemoglobin: 12.7 g/dL (ref 12.0–15.0)
Immature Granulocytes: 0 %
Lymphocytes Relative: 27 %
Lymphs Abs: 2.4 10*3/uL (ref 0.7–4.0)
MCH: 31.6 pg (ref 26.0–34.0)
MCHC: 33.2 g/dL (ref 30.0–36.0)
MCV: 95.3 fL (ref 80.0–100.0)
Monocytes Absolute: 0.5 10*3/uL (ref 0.1–1.0)
Monocytes Relative: 6 %
Neutro Abs: 5.9 10*3/uL (ref 1.7–7.7)
Neutrophils Relative %: 66 %
Platelets: 291 10*3/uL (ref 150–400)
RBC: 4.02 MIL/uL (ref 3.87–5.11)
RDW: 12.3 % (ref 11.5–15.5)
WBC: 9.1 10*3/uL (ref 4.0–10.5)
nRBC: 0 % (ref 0.0–0.2)

## 2020-05-18 LAB — COMPREHENSIVE METABOLIC PANEL
ALT: 20 U/L (ref 0–44)
AST: 21 U/L (ref 15–41)
Albumin: 3.5 g/dL (ref 3.5–5.0)
Alkaline Phosphatase: 56 U/L (ref 38–126)
Anion gap: 10 (ref 5–15)
BUN: 11 mg/dL (ref 6–20)
CO2: 21 mmol/L — ABNORMAL LOW (ref 22–32)
Calcium: 8.8 mg/dL — ABNORMAL LOW (ref 8.9–10.3)
Chloride: 106 mmol/L (ref 98–111)
Creatinine, Ser: 0.72 mg/dL (ref 0.44–1.00)
GFR calc Af Amer: >60 mL/min (ref >60)
GFR calc non Af Amer: >60 mL/min (ref >60)
Glucose, Bld: 91 mg/dL (ref 70–99)
Potassium: 4.1 mmol/L (ref 3.5–5.1)
Sodium: 137 mmol/L (ref 135–145)
Total Bilirubin: 0.5 mg/dL (ref 0.3–1.2)
Total Protein: 7.3 g/dL (ref 6.5–8.1)

## 2020-05-18 LAB — HCG, QUANTITATIVE, PREGNANCY: hCG, Beta Chain, Quant, S: <1 m[IU]/mL (ref <5)

## 2020-05-18 MED ORDER — IOHEXOL 300 MG/ML  SOLN
100.0000 mL | Freq: Once | INTRAMUSCULAR | Status: AC | PRN
  Administered 2020-05-18: 100 mL via INTRAVENOUS
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
## 2020-05-18 NOTE — Discharge Instructions (Addendum)
Follow-up with your doctor this week for recheck.  Return if getting worse
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
## 2020-05-19 ENCOUNTER — Telehealth: Payer: Self-pay | Admitting: Family Medicine

## 2020-05-19 ENCOUNTER — Encounter (HOSPITAL_COMMUNITY): Payer: Medicaid Other
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
## 2020-05-19 NOTE — Telephone Encounter (Signed)
Please have her schedule a check up for evaluation here in office.  May just need PT

## 2020-05-20 ENCOUNTER — Telehealth: Payer: Self-pay | Admitting: *Deleted

## 2020-05-20 NOTE — Telephone Encounter (Signed)
Pt scheduled with Dr Reece Agar 05/23/20 at 12.

## 2020-05-20 NOTE — Telephone Encounter (Signed)
Contacted patient to complete transition of care assessment:  Transition Care Management Follow-up Telephone Call   [facility] Managed Care Transition Call Status:[facility] Call Made   Date of discharge and from where: [facility], 05/18/20   How have you been since you were released from the hospital? " [place] sore, legs [place] hurt"   Any questions or concerns? Why did the ED not give me a referral to an orthopedist instead of referring me back to my PCP  Items Reviewed:  Did the pt receive and understand the discharge instructions provided? Yes   Medications obtained and verified? n/a  Any new allergies since your discharge? No   Dietary orders reviewed? { No  Do you have support at home? Yes, family  Functional Questionnaire: (I = Independent and D = Dependent)  ADLs: Independent Bathing/Dressing:Independent Meal Prep: Independent Eating: Independent Maintaining continence: Independent Transferring/Ambulation: Independent Managing Meds: Independent Follow up appointments reviewed:  PCP Hospital f/u appt confirmed? Yes  Scheduled to see Dr Delynn Flavin on 05/23/20 @ 1200. Specialist Hospital f/u appt confirmed? N/a  Are transportation arrangements needed? No   If their condition worsens, is the pt aware to call PCP or go to the EmergencyDept.? Yes  Was the patient provided with contact information for the PCP's office or ED? Yes  Was to pt encouraged to call back with questions or concerns? Yes  Burnard Bunting, RN, BSN, CCRN [facility]agement [facility] [phone]

## 2020-05-21 ENCOUNTER — Other Ambulatory Visit: Payer: Self-pay | Admitting: Family Medicine

## 2020-05-21 ENCOUNTER — Encounter (HOSPITAL_COMMUNITY): Payer: Medicaid Other

## 2020-05-21 DIAGNOSIS — E1140 Type 2 diabetes mellitus with diabetic neuropathy, unspecified: Secondary | ICD-10-CM

## 2020-05-21 DIAGNOSIS — N182 Chronic kidney disease, stage 2 (mild): Secondary | ICD-10-CM

## 2020-05-21 DIAGNOSIS — M5412 Radiculopathy, cervical region: Secondary | ICD-10-CM

## 2020-05-21 DIAGNOSIS — E1122 Type 2 diabetes mellitus with diabetic chronic kidney disease: Secondary | ICD-10-CM

## 2020-05-21 DIAGNOSIS — M5442 Lumbago with sciatica, left side: Secondary | ICD-10-CM

## 2020-05-21 DIAGNOSIS — G8929 Other chronic pain: Secondary | ICD-10-CM

## 2020-05-22 NOTE — Progress Notes (Signed)
Discharge Progress Report  Patient Details  Name: [name] MRN: [id number] Date of Birth: 1980/11/11 Referring Provider:     CARDIAC REHAB PHASE II ORIENTATION from 03/19/2020 in [facility]  Referring Provider Dr. Wyline Mood       Number of Visits: 13  Reason for Discharge:  Early Exit:  Lack of attendance  Smoking History:  Social History   Tobacco Use  Smoking Status Current Every Day Smoker   Packs/day: 0.50   Years: 20.00   Pack years: 10.00   Types: Cigarettes   Start date: 03/18/1990  Smokeless Tobacco Never Used    Diagnosis:  NSTEMI (non-ST elevated myocardial infarction) (HCC)  Status post coronary artery stent placement  ADL [facility]:   Initial Exercise Prescription:  Initial Exercise Prescription - 03/19/20 1300      Date of Initial Exercise RX and Referring Provider   Date 03/19/20    Referring Provider Dr. Wyline Mood    Expected Discharge Date 06/19/20      Arm Ergometer   Level 1    Watts 42.3    Minutes 22      T5 Nustep   Level 1    SPM 80    Minutes 17      Prescription Details   Frequency (times per week) 3    Duration Progress to 30 minutes of continuous aerobic without signs/symptoms of physical distress      Intensity   THRR 40-80% of Max Heartrate [id number]    Ratings of Perceived Exertion 11-13    Perceived Dyspnea 0-4      Progression   Progression Continue progressive overload as per policy without signs/symptoms or physical distress.      Resistance Training   Training Prescription Yes    Weight 1    Reps 10-15           Discharge Exercise Prescription (Final Exercise Prescription Changes):  Exercise Prescription Changes - 05/02/20 1400      Home Exercise Plan   Plans to continue exercise at Home (comment)    Frequency Add 2 additional days to program exercise sessions.    Initial Home Exercises Provided 05/02/20           Functional Capacity:  6 Minute Walk    Row Name 03/19/20  1352         6 Minute Walk   Phase Initial     Distance 600 feet     Walk Time 6 minutes     # of Rest Breaks 1     MPH 1.13     METS 1.57     RPE 13     Perceived Dyspnea  15     VO2 Peak 5.52     Symptoms Yes (comment)     Comments Took one seated rest break due to SOB and L leg fatigue     Resting HR 94 bpm     Resting BP 90/60     Resting Oxygen Saturation  98 %     Exercise Oxygen Saturation  during 6 min walk 97 %     Max Ex. HR 114 bpm     Max Ex. BP 112/64     2 Minute Post BP 98/66            Psychological, QOL, Others - Outcomes: PHQ 2/9: Depression screen [facility] 2/9 04/08/2020 03/19/2020 10/09/2019 06/05/2019 10/04/2018  Decreased Interest 0 1 0 0 0  Down, Depressed, Hopeless 0 0 1 0  0  PHQ - 2 Score 0 1 1 0 0  Altered sleeping 0 2 1 0 0  Tired, decreased energy 0 1 1 0 0  Change in appetite 0 1 0 0 0  Feeling bad or failure about yourself  0 0 0 0 0  Trouble concentrating 0 1 0 0 0  Moving slowly or fidgety/restless 0 0 0 0 0  Suicidal thoughts 0 0 0 0 0  PHQ-9 Score 0 6 3 0 0  Difficult doing work/chores - Not difficult at all Not difficult at all - Not difficult at all    Quality of Life:  Quality of Life - 03/19/20 1315      Quality of Life   Select Quality of Life      Quality of Life Scores   Health/Function Pre 18 %    Socioeconomic Pre 22.75 %    Psych/Spiritual Pre 22.5 %    Family Pre 30 %    GLOBAL Pre 21.7 %           Personal Goals: Goals established at orientation with interventions provided to work toward goal.  Personal Goals and Risk Factors at Admission - 03/19/20 1449      Core Components/Risk Factors/Patient Goals on Admission    Weight Management Obesity;Yes    Intervention Weight Management: Develop a combined nutrition and exercise program designed to reach desired caloric intake, while maintaining appropriate intake of nutrient and fiber, sodium and fats, and appropriate energy expenditure required for the weight  goal.;Weight Management: Provide education and appropriate resources to help participant work on and attain dietary goals.;Weight Management/Obesity: Establish reasonable short term and long term weight goals.;Obesity: Provide education and appropriate resources to help participant work on and attain dietary goals.    Admit Weight 280 lb 9.6 oz (127.3 kg)    Goal Weight: Short Term 273 lb 1.6 oz (123.9 kg)    Goal Weight: Long Term 265 lb 9.6 oz (120.5 kg)    Expected Outcomes Weight Maintenance: Understanding of the daily nutrition guidelines, which includes 25-35% calories from fat, 7% or less cal from saturated fats, less than 200mg  cholesterol, less than 1.5gm of sodium, & 5 or more servings of fruits and vegetables daily;Short Term: Continue to assess and modify interventions until short term weight is achieved    Tobacco Cessation Yes    Number of packs per day 6-7 cigerattes/day    Intervention Assist the participant in steps to quit. Provide individualized education and counseling about committing to Tobacco Cessation, relapse prevention, and pharmacological support that can be provided by physician.; , assist with locating and accessing local/national Quit Smoking programs, and support quit date choice.    Expected Outcomes Short Term: Will quit all tobacco product use, adhering to prevention of relapse plan.    Diabetes Yes    Intervention Provide education about signs/symptoms and action to take for hypo/hyperglycemia.;Provide education about proper nutrition, including hydration, and aerobic/resistive exercise prescription along with prescribed medications to achieve blood glucose in normal ranges: Fasting glucose 65-99 mg/dL    Expected Outcomes Short Term: Participant verbalizes understanding of the signs/symptoms and immediate care of hyper/hypoglycemia, proper foot care and importance of medication, aerobic/resistive exercise and nutrition plan for blood glucose  control.    Personal Goal Other Yes    Personal Goal Lose 15 lbs in program and continue to lose weight. Be able to walk 50 feet without getting SOB or fatigued.    Intervention Patient will participate in  CR 3 days/week and supplement with exercise at home 2 days/week.    Expected Outcomes Patient will complete the program and meet both program and personal goals.            Personal Goals Discharge:  Goals and Risk Factor Review    Row Name 04/02/20 0826 04/30/20 0826           Core Components/Risk Factors/Patient Goals Review   Personal Goals Review Tobacco Cessation;Weight Management/Obesity;Other  Lose 15 lbs. Walk 50 ft without having to stop for SOB or fatigue. Tobacco Cessation;Weight Management/Obesity;Other;Diabetes  Lose 15 lbs; walk 50 ft w/o having to stop for SOB or fatigue.      Review Patient is new to the program completing 3 sessions. Will continue to monitor as she works toward meeting both program and personal goals. Patient has completed 10 sessions losing 2 lbs since her initial visit. She is doing well in the program but her attendance has not been consistent. 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 She continues to smoke 6 to 7 cigerattes/day. Her last A1C was 12/2019 at 6.1 mg/dl. Her reports glucose readings are usually WNL. She says she feels like the program is helping her. She is [place] not able to walk very long distance without having to stop due to SOB and fatigue. She hopes this will improve as she continues to come. Will continue to monitor for progress.      Expected Outcomes Patient will continue to attend sessions and complete the program meeting both program and personal goals. Patient will continue to attend sessions and complete the program meeting both program and personal goals.             Exercise Goals and Review:  Exercise Goals    Row Name 03/19/20 1435 04/28/20 1419             Exercise Goals   Increase Physical Activity Yes Yes      Intervention Provide advice, education, support and counseling about physical activity/exercise needs.;Develop an individualized exercise prescription for aerobic and resistive training based on initial evaluation findings, risk stratification, comorbidities and participant's personal goals. Provide advice, education, support and counseling about physical activity/exercise needs.;Develop an individualized exercise prescription for aerobic and resistive training based on initial evaluation findings, risk stratification, comorbidities and participant's personal goals.      Expected Outcomes Short Term: Attend rehab on a regular basis to increase amount of physical activity.;Long Term: Add in home exercise to make exercise part of routine and to increase amount of physical activity.;Long Term: Exercising regularly at least 3-5 days a week. Short Term: Attend rehab on a regular basis to increase amount of physical activity.;Long Term: Add in home exercise to make exercise part of routine and to increase amount of physical activity.;Long Term: Exercising regularly at least 3-5 days a week.      Increase Strength and Stamina Yes Yes      Intervention Provide advice, education, support and counseling about physical activity/exercise needs.;Develop an individualized exercise prescription for aerobic and resistive training based on initial evaluation findings, risk stratification, comorbidities and participant's personal goals. Provide advice, education, support and counseling about physical activity/exercise needs.;Develop an individualized exercise prescription for aerobic and resistive training based on initial evaluation findings, risk stratification, comorbidities and participant's personal goals.      Expected Outcomes Short Term: Increase workloads from initial exercise prescription for resistance, speed, and  METs.;Short Term: Perform resistance  training exercises routinely during rehab and add in resistance training at home;Long Term: Improve cardiorespiratory fitness, muscular endurance and strength as measured by increased METs and functional capacity ( ) Short Term: Increase workloads from initial exercise prescription for resistance, speed, and METs.;Short Term: Perform resistance training exercises routinely during rehab and add in resistance training at home;Long Term: Improve cardiorespiratory fitness, muscular endurance and strength as measured by increased METs and functional capacity ( )      Able to understand and use rate of perceived exertion (RPE) scale Yes Yes      Intervention Provide education and explanation on how to use RPE scale Provide education and explanation on how to use RPE scale      Expected Outcomes Short Term: Able to use RPE daily in rehab to express subjective intensity level;Long Term:  Able to use RPE to guide intensity level when exercising independently Short Term: Able to use RPE daily in rehab to express subjective intensity level;Long Term:  Able to use RPE to guide intensity level when exercising independently      Able to understand and use Dyspnea scale Yes --      Intervention Provide education and explanation on how to use Dyspnea scale --      Expected Outcomes Short Term: Able to use Dyspnea scale daily in rehab to express subjective sense of shortness of breath during exertion;Long Term: Able to use Dyspnea scale to guide intensity level when exercising independently --      Knowledge and understanding of Target Heart Rate Range (THRR) Yes Yes      Intervention Provide education and explanation of THRR including how the numbers were predicted and where they are located for reference Provide education and explanation of THRR including how the numbers were predicted and where they are located for reference      Expected Outcomes Short Term: Able to state/look up THRR;Short Term: Able to use daily  as guideline for intensity in rehab;Long Term: Able to use THRR to govern intensity when exercising independently Short Term: Able to state/look up THRR;Short Term: Able to use daily as guideline for intensity in rehab;Long Term: Able to use THRR to govern intensity when exercising independently      Able to check pulse independently Yes Yes      Intervention Provide education and demonstration on how to check pulse in carotid and radial arteries.;Review the importance of being able to check your own pulse for safety during independent exercise Provide education and demonstration on how to check pulse in carotid and radial arteries.;Review the importance of being able to check your own pulse for safety during independent exercise      Expected Outcomes Short Term: Able to explain why pulse checking is important during independent exercise;Long Term: Able to check pulse independently and accurately Short Term: Able to explain why pulse checking is important during independent exercise;Long Term: Able to check pulse independently and accurately      Understanding of Exercise Prescription Yes Yes      Intervention Provide education, explanation, and written materials on patient's individual exercise prescription Provide education, explanation, and written materials on patient's individual exercise prescription      Expected Outcomes Short Term: Able to explain program exercise prescription Short Term: Able to explain program exercise prescription             Exercise Goals Re-Evaluation:  Exercise Goals Re-Evaluation    Row Name 04/28/20 1419  Exercise Goal Re-Evaluation   Exercise Goals Review Increase Physical Activity;Increase Strength and Stamina;Able to understand and use rate of perceived exertion (RPE) scale;Knowledge and understanding of Target Heart Rate Range (THRR);Able to check pulse independently;Understanding of Exercise Prescription       Comments Pt has completed 9  exercise sessions. She has been able to increase her workloads and has been working through her hip/back pain. She has a positive outlook. She is currently exercising at 3.8 METs on the stepper. Will continue to monitor and progress as able.       Expected Outcomes Pt will reach her goal of losing weight and will begin to exercise at home.              Nutrition & Weight - Outcomes:  Pre Biometrics - 03/19/20 1433      Pre Biometrics   Height 5\' 10"  (1.778 m)    Weight 172.6 kg    Waist Circumference 64 inches    Hip Circumference 62 inches    Waist to Hip Ratio 1.03 %    BMI (Calculated) 54.61    Triceps Skinfold 32 mm    % Body Fat 61.1 %    Grip Strength 27.3 kg    Flexibility 0 in    Single Leg Stand 4.47 seconds            Nutrition:  Nutrition Therapy & Goals - 04/30/20 0825      Personal Nutrition Goals   Comments Patient continues to say she follows a diabetic heart healthy diet. Will continue to monitor.      Intervention Plan   Intervention Nutrition handout(s) given to patient.           Nutrition Discharge:  Nutrition Assessments - 03/19/20 1448      MEDFICTS Scores   Pre Score 3           Education Questionnaire Score:  Knowledge Questionnaire Score - 03/19/20 1448      Knowledge Questionnaire Score   Pre Score 26/28          Patient stopped coming to Cardiac Rehab on 05/07/20. She completed 13 sessions. Called patient to remind them of the Cardiac Rehab policy that if they do not call or come for 3 consecutive visits that they would be discharged from the CR program. No answer and no VM. Letter mailed 05/21/20. Doctor will be informed.

## 2020-05-23 ENCOUNTER — Encounter (HOSPITAL_COMMUNITY): Payer: Medicaid Other

## 2020-05-23 ENCOUNTER — Encounter: Payer: Self-pay | Admitting: Family Medicine

## 2020-05-23 ENCOUNTER — Other Ambulatory Visit: Payer: Self-pay

## 2020-05-23 ENCOUNTER — Ambulatory Visit (INDEPENDENT_AMBULATORY_CARE_PROVIDER_SITE_OTHER): Payer: Medicaid Other | Admitting: Family Medicine

## 2020-05-23 VITALS — BP 126/83 | HR 91 | Temp 97.8°F | Ht 69.0 in | Wt 378.0 lb

## 2020-05-23 DIAGNOSIS — M5441 Lumbago with sciatica, right side: Secondary | ICD-10-CM | POA: Diagnosis not present

## 2020-05-23 DIAGNOSIS — G8929 Other chronic pain: Secondary | ICD-10-CM | POA: Diagnosis not present

## 2020-05-23 DIAGNOSIS — E1140 Type 2 diabetes mellitus with diabetic neuropathy, unspecified: Secondary | ICD-10-CM | POA: Diagnosis not present

## 2020-05-23 DIAGNOSIS — M5412 Radiculopathy, cervical region: Secondary | ICD-10-CM | POA: Diagnosis not present

## 2020-05-23 DIAGNOSIS — R200 Anesthesia of skin: Secondary | ICD-10-CM | POA: Diagnosis not present

## 2020-05-23 DIAGNOSIS — M5442 Lumbago with sciatica, left side: Secondary | ICD-10-CM | POA: Diagnosis not present

## 2020-05-23 MED ORDER — TIZANIDINE HCL 4 MG PO TABS: 4.0000 mg | ORAL_TABLET | Freq: Four times a day (QID) | ORAL | 0 refills | Status: DC | PRN

## 2020-05-23 MED ORDER — TRAMADOL HCL 50 MG PO TABS: 50.0000 mg | ORAL_TABLET | Freq: Two times a day (BID) | ORAL | 1 refills | Status: DC | PRN

## 2020-05-23 MED ORDER — METHYLPREDNISOLONE ACETATE 80 MG/ML IJ SUSP
80.0000 mg | Freq: Once | INTRAMUSCULAR | Status: AC
  Administered 2020-05-23: 80 mg via INTRAMUSCULAR

## 2020-05-23 NOTE — Patient Instructions (Signed)
I have increased the quantity of your tramadol temporarily so that you may use 1-2 tablets at bedtime and 1 tablet during the day IF needed.
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
## 2020-05-26 ENCOUNTER — Encounter (HOSPITAL_COMMUNITY): Payer: Medicaid Other

## 2020-05-26 ENCOUNTER — Telehealth: Payer: Self-pay | Admitting: Family Medicine

## 2020-05-26 ENCOUNTER — Telehealth: Payer: Self-pay | Admitting: Cardiology

## 2020-05-26 NOTE — Telephone Encounter (Signed)
Dr Wyline Mood has not replied yet about stent safety and MRI.

## 2020-05-26 NOTE — Telephone Encounter (Signed)
Husband aware.

## 2020-05-26 NOTE — Progress Notes (Signed)
Ok for MRI  J Shemicka Cohrs MD 

## 2020-05-26 NOTE — Telephone Encounter (Signed)
Patient aware a release form will need to be signed to give lawyer any information.  Husband states lawyer has permission form for lawyer office to receive information.  Husband aware this office will need to see signed form before any information can be released.

## 2020-05-26 NOTE — Telephone Encounter (Signed)
I will forward to Dr Branch 

## 2020-05-26 NOTE — Telephone Encounter (Signed)
Ok for MRI  J Earlee Herald MD 

## 2020-05-26 NOTE — Telephone Encounter (Signed)
Pt aware.
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
## 2020-05-26 NOTE — Telephone Encounter (Signed)
Pt was wanting to know about neurosurgery referral which I advised him it was placed and then they wanted to know if Dr Wyline Mood had said it was ok for pt to have MRI with her stents.

## 2020-05-26 NOTE — Telephone Encounter (Signed)
Not sure what updates I can give him.  Glad to send my note if Ms Pursel agrees.

## 2020-05-28 ENCOUNTER — Encounter (HOSPITAL_COMMUNITY): Payer: Medicaid Other

## 2020-05-30 ENCOUNTER — Encounter (HOSPITAL_COMMUNITY): Payer: Medicaid Other

## 2020-05-30 ENCOUNTER — Telehealth: Payer: Self-pay | Admitting: Cardiology

## 2020-05-30 ENCOUNTER — Telehealth: Payer: Self-pay | Admitting: Orthopaedic Surgery

## 2020-05-30 NOTE — Telephone Encounter (Signed)
I believe this is a [facility] patient.
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
## 2020-05-30 NOTE — Telephone Encounter (Signed)
Pts spouse states she is needing the MRI by 06/06/20. Pt has been cleared by cardiologist for the MRI. Husband states wifes condition is getting worse.

## 2020-05-30 NOTE — Telephone Encounter (Signed)
We informed patient that the pcp should order the MRI, not cardiology. Pt will talk with pcp.

## 2020-05-30 NOTE — Telephone Encounter (Signed)
Spoke to husband, he is aware that Dr. Reece Agar is out until 06/03/20. He wants to try to get stat MRI on 06/03/20-06/04/20 for 06/06/20 appt.

## 2020-05-30 NOTE — Telephone Encounter (Signed)
New message    Patient husband  Called he wants Dr Wyline Mood to order MRI before pt sees neurologist

## 2020-06-02 ENCOUNTER — Encounter (HOSPITAL_COMMUNITY): Payer: Medicaid Other

## 2020-06-02 NOTE — Telephone Encounter (Signed)
Dr Wyline Mood ok'd MRI if needed.  I didn't order this.  She was being referred urgently to neurosurgery.  Is this order completed? If situation is getting worse, she needs to go to ED for stat imaging.  Unlikely to get an outpatient stat MRI after a holiday.

## 2020-06-03 ENCOUNTER — Other Ambulatory Visit: Payer: Self-pay | Admitting: Family Medicine

## 2020-06-03 DIAGNOSIS — M5441 Lumbago with sciatica, right side: Secondary | ICD-10-CM

## 2020-06-03 DIAGNOSIS — M5442 Lumbago with sciatica, left side: Secondary | ICD-10-CM

## 2020-06-03 DIAGNOSIS — G8929 Other chronic pain: Secondary | ICD-10-CM

## 2020-06-03 NOTE — Telephone Encounter (Signed)
Order placed but AGAIN, if she is having RED FLAGS she needs to be seen in an emergency department.

## 2020-06-03 NOTE — Telephone Encounter (Signed)
Patient aware and verbalized understanding. °

## 2020-06-03 NOTE — Telephone Encounter (Signed)
Patient husband upset and states he would Raske liked this ordered by you.

## 2020-06-04 ENCOUNTER — Encounter (HOSPITAL_COMMUNITY): Payer: Medicaid Other

## 2020-06-05 ENCOUNTER — Telehealth: Payer: Self-pay | Admitting: Family Medicine

## 2020-06-06 ENCOUNTER — Encounter (HOSPITAL_COMMUNITY): Payer: Medicaid Other

## 2020-06-06 DIAGNOSIS — R200 Anesthesia of skin: Secondary | ICD-10-CM | POA: Diagnosis not present

## 2020-06-06 DIAGNOSIS — R202 Paresthesia of skin: Secondary | ICD-10-CM | POA: Diagnosis not present

## 2020-06-06 NOTE — Telephone Encounter (Signed)
Spoke to patient's husband and notified him that we are waiting on approval from insurance

## 2020-06-09 ENCOUNTER — Encounter (HOSPITAL_COMMUNITY): Payer: Medicaid Other

## 2020-06-09 ENCOUNTER — Ambulatory Visit: Payer: Medicaid Other | Admitting: Gastroenterology

## 2020-06-11 ENCOUNTER — Telehealth: Payer: Self-pay | Admitting: Family Medicine

## 2020-06-11 ENCOUNTER — Encounter (HOSPITAL_COMMUNITY): Payer: Medicaid Other

## 2020-06-13 ENCOUNTER — Ambulatory Visit
Admission: RE | Admit: 2020-06-13 | Discharge: 2020-06-13 | Disposition: A | Discharge status: Home / Self Care | Payer: Medicaid Other | Source: Ambulatory Visit | Attending: Family Medicine | Admitting: Family Medicine

## 2020-06-13 ENCOUNTER — Other Ambulatory Visit: Payer: Self-pay | Admitting: Neurosurgery

## 2020-06-13 ENCOUNTER — Encounter (HOSPITAL_COMMUNITY): Payer: Medicaid Other

## 2020-06-13 ENCOUNTER — Other Ambulatory Visit: Payer: Self-pay

## 2020-06-13 DIAGNOSIS — R200 Anesthesia of skin: Secondary | ICD-10-CM

## 2020-06-13 DIAGNOSIS — G8929 Other chronic pain: Secondary | ICD-10-CM

## 2020-06-13 DIAGNOSIS — R202 Paresthesia of skin: Secondary | ICD-10-CM

## 2020-06-13 DIAGNOSIS — M48061 Spinal stenosis, lumbar region without neurogenic claudication: Secondary | ICD-10-CM | POA: Diagnosis not present

## 2020-06-13 DIAGNOSIS — M5441 Lumbago with sciatica, right side: Secondary | ICD-10-CM

## 2020-06-14 ENCOUNTER — Ambulatory Visit
Admission: RE | Admit: 2020-06-14 | Discharge: 2020-06-14 | Disposition: A | Discharge status: Home / Self Care | Payer: Medicaid Other | Source: Ambulatory Visit | Attending: Neurosurgery | Admitting: Neurosurgery

## 2020-06-14 DIAGNOSIS — R200 Anesthesia of skin: Secondary | ICD-10-CM

## 2020-06-14 DIAGNOSIS — R202 Paresthesia of skin: Secondary | ICD-10-CM

## 2020-06-14 DIAGNOSIS — M5124 Other intervertebral disc displacement, thoracic region: Secondary | ICD-10-CM | POA: Diagnosis not present

## 2020-06-14 DIAGNOSIS — R531 Weakness: Secondary | ICD-10-CM | POA: Diagnosis not present

## 2020-06-14 DIAGNOSIS — R32 Unspecified urinary incontinence: Secondary | ICD-10-CM | POA: Diagnosis not present

## 2020-06-16 ENCOUNTER — Telehealth: Payer: Self-pay | Admitting: Family Medicine

## 2020-06-16 NOTE — Telephone Encounter (Signed)
Spoke to patient's husband Ree Kida) - he states that patient's back pain is getting worse and is now constant.  Patient is taking tramadol as needed for the pain and that is not helping - patient is also taking tizanidine which helps for abt 45 mins to an hour. She is unable to sleep through the night due to the pain.  Please review and advise

## 2020-06-16 NOTE — Telephone Encounter (Signed)
Dr Dettinger reviewed the Lumbar scan and there were no significant findings.

## 2020-06-16 NOTE — Telephone Encounter (Signed)
Patient aware and verbalizes understanding. 

## 2020-06-23 ENCOUNTER — Other Ambulatory Visit: Payer: Self-pay

## 2020-06-23 ENCOUNTER — Ambulatory Visit: Payer: Medicaid Other | Admitting: Internal Medicine

## 2020-06-23 ENCOUNTER — Encounter: Payer: Self-pay | Admitting: Internal Medicine

## 2020-06-23 VITALS — BP 130/76 | HR 84 | Ht 69.0 in | Wt 379.0 lb

## 2020-06-23 DIAGNOSIS — N182 Chronic kidney disease, stage 2 (mild): Secondary | ICD-10-CM | POA: Diagnosis not present

## 2020-06-23 DIAGNOSIS — E1122 Type 2 diabetes mellitus with diabetic chronic kidney disease: Secondary | ICD-10-CM | POA: Diagnosis not present

## 2020-06-23 DIAGNOSIS — I2510 Atherosclerotic heart disease of native coronary artery without angina pectoris: Secondary | ICD-10-CM

## 2020-06-23 DIAGNOSIS — E782 Mixed hyperlipidemia: Secondary | ICD-10-CM | POA: Diagnosis not present
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
## 2020-06-23 NOTE — Patient Instructions (Signed)
Medication Instructions:  Your physician recommends that you continue on your current medications as directed. Please refer to the Current Medication list given to you today.  *If you need a refill on your cardiac medications before your next appointment, please call your pharmacy*   Lab Work: FASTING lab work before your next visit with Dr. Rennis Golden   If you have labs (blood work) drawn today and your tests are completely normal, you will receive your results only by: Marland Kitchen MyChart Message (if you have MyChart) OR . A paper copy in the mail If you have any lab test that is abnormal or we need to change your treatment, we will call you to review the results.   Testing/Procedures: NONE   Follow-Up: At [facility], you and your health needs are our priority.  As part of our continuing mission to provide you with exceptional heart care, we have created designated Provider Care Teams.  These Care Teams include your primary Cardiologist (physician) and Advanced Practice Providers (APPs -  Physician Assistants and Nurse Practitioners) who all work together to provide you with the care you need, when you need it.  We recommend signing up for the patient portal called "MyChart".  Sign up information is provided on this After Visit Summary.  MyChart is used to connect with patients for Virtual Visits (Telemedicine).  Patients are able to view lab/test results, encounter notes, upcoming appointments, etc.  Non-urgent messages can be sent to your provider as well.   To learn more about what you can do with MyChart, go to [email or web address].    Your next appointment:   12 month(s) - lipid clinic  The format for your next appointment:   In Person or Virtual  Provider:   K. Italy Hilty, MD   Other Instructions

## 2020-06-24 DIAGNOSIS — R200 Anesthesia of skin: Secondary | ICD-10-CM | POA: Diagnosis not present

## 2020-06-24 DIAGNOSIS — M5440 Lumbago with sciatica, unspecified side: Secondary | ICD-10-CM | POA: Insufficient documentation

## 2020-06-26 ENCOUNTER — Other Ambulatory Visit: Payer: Self-pay | Admitting: Family Medicine

## 2020-06-26 DIAGNOSIS — E1122 Type 2 diabetes mellitus with diabetic chronic kidney disease: Secondary | ICD-10-CM

## 2020-06-26 DIAGNOSIS — N182 Chronic kidney disease, stage 2 (mild): Secondary | ICD-10-CM

## 2020-06-27 ENCOUNTER — Other Ambulatory Visit: Payer: Self-pay

## 2020-06-27 ENCOUNTER — Ambulatory Visit (INDEPENDENT_AMBULATORY_CARE_PROVIDER_SITE_OTHER): Payer: Medicaid Other | Admitting: Gastroenterology

## 2020-06-27 ENCOUNTER — Encounter: Payer: Self-pay | Admitting: Gastroenterology

## 2020-06-27 DIAGNOSIS — R110 Nausea: Secondary | ICD-10-CM | POA: Insufficient documentation

## 2020-06-27 DIAGNOSIS — R197 Diarrhea, unspecified: Secondary | ICD-10-CM | POA: Diagnosis not present

## 2020-06-27 DIAGNOSIS — R142 Eructation: Secondary | ICD-10-CM | POA: Diagnosis not present

## 2020-06-27 MED ORDER — COLESTIPOL HCL 1 G PO TABS: 2.0000 g | ORAL_TABLET | Freq: Every day | ORAL | 3 refills | Status: DC
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
## 2020-06-27 NOTE — Patient Instructions (Signed)
1. For possible gallbladder surgery associated diarrhea: start colestid 2 grams (2 capsule) once daily. Do not take other medications four hours before or after colestid. Call if we need to increase dose. HOLD if you get constipation. 2. I will discuss possible upper endoscopy with Dr. Wyline Mood.  We will contact you first of the week. 3. Continue pantoprazole 40 mg daily.

## 2020-07-01 ENCOUNTER — Other Ambulatory Visit: Payer: Self-pay

## 2020-07-01 ENCOUNTER — Telehealth: Payer: Self-pay | Admitting: Gastroenterology

## 2020-07-01 DIAGNOSIS — R197 Diarrhea, unspecified: Secondary | ICD-10-CM

## 2020-07-01 DIAGNOSIS — Z79899 Other long term (current) drug therapy: Secondary | ICD-10-CM

## 2020-07-01 NOTE — Telephone Encounter (Signed)
Spoke with pt and pts spouse. Pt is aware that Tana Coast, PA discussed EGD with Dr. Wyline Mood.  Pt would prefer to have the EGD with Dr. Jena Gauss. Mindy, please arrange.   Lab orders will be faxed to [facility] for pt to r/o celiac.

## 2020-07-01 NOTE — Telephone Encounter (Signed)
Please let patient know that I discussed case with Dr. Wyline Mood and he has ok'd her pursuing EGD if she is [place] rather do EGD instead of the barium test to look at upper gi tract. Patient's stent is almost six months out and risk if she had to hold plavix post procedure is minimal.   If she is agreeable, schedule EGD with [facility] with propofol. ASA III. Continue ASA/Plavix. Dx: dyspepsia, nausea, poor appetite, malodorous burping  I also need her to be screened for celiac due to diarrhea PRIOR to her EGD in case we need to do biopsy. Send for TTG, IgA and serum IgA level

## 2020-07-01 NOTE — Telephone Encounter (Signed)
Will call patient once we receive Dr. Jena Gauss January schedule. Pt spouse aware as he would not let me speak with pt.

## 2020-07-04 ENCOUNTER — Encounter (HOSPITAL_COMMUNITY): Payer: Self-pay | Admitting: Physical Therapy

## 2020-07-04 ENCOUNTER — Other Ambulatory Visit: Payer: Self-pay

## 2020-07-04 ENCOUNTER — Ambulatory Visit (HOSPITAL_COMMUNITY): Payer: Medicaid Other | Attending: Neurosurgery | Admitting: Physical Therapy

## 2020-07-04 DIAGNOSIS — R262 Difficulty in walking, not elsewhere classified: Secondary | ICD-10-CM

## 2020-07-04 DIAGNOSIS — M6281 Muscle weakness (generalized): Secondary | ICD-10-CM | POA: Diagnosis not present

## 2020-07-04 DIAGNOSIS — M5416 Radiculopathy, lumbar region: Secondary | ICD-10-CM
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
## 2020-07-08 ENCOUNTER — Other Ambulatory Visit: Payer: Self-pay | Admitting: Family Medicine

## 2020-07-08 DIAGNOSIS — R200 Anesthesia of skin: Secondary | ICD-10-CM

## 2020-07-08 DIAGNOSIS — M5442 Lumbago with sciatica, left side: Secondary | ICD-10-CM

## 2020-07-08 DIAGNOSIS — G8929 Other chronic pain: Secondary | ICD-10-CM

## 2020-07-08 DIAGNOSIS — M5412 Radiculopathy, cervical region: Secondary | ICD-10-CM

## 2020-07-08 DIAGNOSIS — M5441 Lumbago with sciatica, right side: Secondary | ICD-10-CM

## 2020-07-09 ENCOUNTER — Ambulatory Visit: Payer: Medicaid Other | Admitting: Family Medicine

## 2020-07-15 ENCOUNTER — Other Ambulatory Visit: Payer: Self-pay

## 2020-07-15 ENCOUNTER — Ambulatory Visit (HOSPITAL_COMMUNITY): Payer: Medicaid Other | Admitting: Physical Therapy

## 2020-07-15 ENCOUNTER — Encounter (HOSPITAL_COMMUNITY): Payer: Self-pay | Admitting: Physical Therapy

## 2020-07-15 DIAGNOSIS — M6281 Muscle weakness (generalized): Secondary | ICD-10-CM

## 2020-07-15 DIAGNOSIS — R262 Difficulty in walking, not elsewhere classified: Secondary | ICD-10-CM | POA: Diagnosis not present

## 2020-07-15 DIAGNOSIS — M5416 Radiculopathy, lumbar region: Secondary | ICD-10-CM | POA: Diagnosis not present

## 2020-07-15 NOTE — Patient Instructions (Signed)
Access Code: [email or web address] URL: [email or web address] Date: 07/15/2020 Prepared by: [name] [name]  Exercises Seated Long Arc Quad - 1 x daily - 7 x weekly - 2 sets - 10 reps - 5 second hold Seated Scapular Retraction - 1 x daily - 7 x weekly - 2 sets - 10 reps

## 2020-07-15 NOTE — Therapy (Signed)
[facility] [address] [place], [place], [address] Phone: [phone]   Fax:  [phone]  Physical Therapy Treatment  Patient Details  Name: [name] MRN: [id number] Date of Birth: Jan 10, 1981 Referring Provider (PT): Kary Kos   Encounter Date: 07/15/2020   PT End of Session - 07/15/20 1408    Visit Number 2    Number of Visits 12    Date for PT Re-Evaluation 08/15/20    Authorization Type healthy blue    PT Start Time 1407    PT Stop Time 1445    PT Time Calculation (min) 38 min    Equipment Utilized During Treatment Gait belt    Activity Tolerance Patient tolerated treatment well    Behavior During Therapy [facility] for tasks assessed/performed           Past Medical History:  Diagnosis Date  . Allergy   . Anemia   . Anxiety   . CHF (congestive heart failure) ([place])   . Chronic kidney disease    history of kidney stones  . Coronary artery disease 09/2011   a.s/p BMS to LAD and angioplasty of RI in 09/2011 b. patent stent by cath in 02/2012, 12/2013, and 02/2016 with most recent showing 30-40% dLAD and 30-40% RCA stenosis  . Depression   . Diabetes mellitus   . Hyperlipidemia   . Hypertension   . Midsternal chest pain 03/03/2012  . Migraines   . Morbid obesity ([place])   . Myocardial infarct ([place]) 09/2011  . Myocardial infarction ([place]) 02/2016  . Palpitations 02/17/2012  . Sleep apnea   . Ureteral stone with hydronephrosis   . Urinary tract obstruction due to kidney stone 02/15/2017    Past Surgical History:  Procedure Laterality Date  . CARDIAC CATHETERIZATION  10/08/2011   LAD: 95% mid, Ramus: 95% ostial  . CARPAL TUNNEL RELEASE    . CESAREAN SECTION    . CHOLECYSTECTOMY    . CORONARY ANGIOPLASTY WITH STENT PLACEMENT  10/08/2011   LAD: BMS, Ramus: cutting balloon angioplasty  . CORONARY STENT INTERVENTION N/A 01/14/2020   Procedure: CORONARY STENT INTERVENTION;  Surgeon: Leonie Man, MD;  Location: [place] CV LAB;  Service: Cardiovascular;  Laterality: N/A;  . CYSTOSCOPY WITH RETROGRADE PYELOGRAM, URETEROSCOPY AND STENT PLACEMENT Left 02/16/2017   Procedure: CYSTOSCOPY WITH RETROGRADE PYELOGRAM, URETEROSCOPY AND STENT PLACEMENT;  Surgeon: Kathie Rhodes, MD;  Location: [facility];  Service: Urology;  Laterality: Left;  . CYSTOSCOPY WITH RETROGRADE PYELOGRAM, URETEROSCOPY AND STENT PLACEMENT Left 03/28/2017   Procedure: LEFT STENT REMOVAL LEFT RETROGRADE PYELOGRAM LEFT URETEROSCOPY   LASER LITHOTRIPSY  AND  STENT REPLACEMENT;  Surgeon: Cleon Gustin, MD;  Location: [facility];  Service: Urology;  Laterality: Left;  . LEFT HEART CATH AND CORONARY ANGIOGRAPHY N/A 08/15/2018   Procedure: LEFT HEART CATH AND CORONARY ANGIOGRAPHY;  Surgeon: Martinique, Peter M, MD;  Location: [place] CV LAB;  Service: Cardiovascular;  Laterality: N/A;  . LEFT HEART CATH AND CORONARY ANGIOGRAPHY N/A 01/14/2020   Procedure: LEFT HEART CATH AND CORONARY ANGIOGRAPHY;  Surgeon: Leonie Man, MD;  Location: [place] CV LAB;  Service: Cardiovascular;  Laterality: N/A;  . LEFT HEART CATHETERIZATION WITH CORONARY ANGIOGRAM N/A 10/08/2011   Procedure: LEFT HEART CATHETERIZATION WITH CORONARY ANGIOGRAM;  Surgeon: Peter M Martinique, MD;  Location: [facility] CATH LAB;  Service: Cardiovascular;  Laterality: N/A;  . LEFT HEART CATHETERIZATION WITH CORONARY ANGIOGRAM N/A 03/02/2012   Procedure: LEFT HEART CATHETERIZATION WITH CORONARY ANGIOGRAM;  Surgeon: Burnell Blanks, MD;  Location: [facility] CATH LAB;  Service: Cardiovascular;  Laterality: N/A;  . LEFT HEART CATHETERIZATION WITH CORONARY ANGIOGRAM N/A 01/04/2014   Procedure: LEFT HEART CATHETERIZATION WITH CORONARY ANGIOGRAM;  Surgeon: Troy Sine, MD;  Location: [facility] CATH LAB;  Service: Cardiovascular;  Laterality: N/A;    There were no vitals filed for this visit.   Subjective Assessment - 07/15/20 1408    Subjective Patient states she has been doing her exercises. She continues to  have trouble getting up and walking. She feels slight improvement with exercises but is overall about the same.    Pertinent History DM, HTN, CHF,    Limitations Sitting;Lifting;Standing;Reading;Walking;House hold activities    How long can you sit comfortably? 10-15 minutes    How long can you stand comfortably? pt was unable to stand long before anyway due to neuropathy in her feet.    How long can you walk comfortably? 10 minutes    Patient Stated Goals less pain , to be able walk better, get in and out of car easier    Currently in Pain? Yes    Pain Score 6     Pain Location Back    Pain Orientation Lower    Pain Onset More than a month ago                             [facility] Adult PT Treatment/Exercise - 07/15/20 0001      Lumbar Exercises: Seated   Long Arc Quad on Chair Both;1 set;10 reps    LAQ on Chair Limitations 5 second holds    Other Seated Lumbar Exercises seated cervical retractions 2x 10; seated scap retractions 2x10    Other Seated Lumbar Exercises ab and glute sets 10 x 5 second; w back in seated 2x 10; hip add/abd  iso 10 x 10second holds each                  PT Education - 07/15/20 1412    Education Details HEP, exercise mechanics    Person(s) Educated Patient    Methods Explanation;Demonstration    Comprehension Verbalized understanding;Returned demonstration            PT Short Term Goals - 07/04/20 1510      PT SHORT TERM GOAL #1   Title Pt to be I in HEP to improve pain level to no greater than a 6/10    Time 3    Period Weeks    Status New    Target Date 07/25/20      PT SHORT TERM GOAL #2   Title PT back and hip mobilty to improve to allow pt to be able to put her shoes and socks on with ease.    Time 3    Period Weeks    Status New      PT SHORT TERM GOAL #3   Title PT to core strength to have improved to allow pt to be able to ride in a car for 30 mintues without having increased pain to allow pt to go to  appointments without discomfort.             PT Long Term Goals - 07/04/20 1512      PT LONG TERM GOAL #1   Title Pt to be I in an advance HEP to allow pt to be able to decrease her back pain to no greater than a  3/10    Time 6    Period Weeks    Status New    Target Date 08/15/20      PT LONG TERM GOAL #2   Title PT to improve her hip and lumbar mobility to be able to reach down and pick items off of the ground.    Time 6    Period Weeks    Status New      PT LONG TERM GOAL #3   Title PT  core and LE strength to increase to allow pt to be able to come sit to stand without the use of her UE and without increased pain.    Time 6    Period Weeks    Status New      PT LONG TERM GOAL #4   Title Pt to be able to single leg stance on both LE so  she can walk in her home without feeling as if she needs to furniture walk .    Time 6    Period Weeks    Status New      PT LONG TERM GOAL #5   Title PT to be able to walk for 30 mintues in order to complete grocery shopping without her back pain increasing.    Time 3    Period Weeks    Status New                 Plan - 07/15/20 1408    Clinical Impression Statement Patient requires min verbal cueing for mechanics of cervical retraction in seated with c/o pulling in mid back. She tolerates sitting exercises well with several rest breaks. Today's session focus on postural strengthening and core strength. Patient with c/o mid back pain throughout session but completes exercise with moderate fatigue. Patient will continue to benefit from skilled physical therapy in order to reduce impairment and improve function.    Personal Factors and Comorbidities Comorbidity 3+    Examination-Activity Limitations Bed Mobility;Caring for Others;Carry;Dressing;Hygiene/Grooming;Locomotion Level;Stairs;Squat    Examination-Participation Restrictions Cleaning;Community Activity;Laundry;Driving;Meal Prep    Stability/Clinical Decision Making  Evolving/Moderate complexity    Rehab Potential Good    PT Frequency 2x / week    PT Duration 6 weeks    PT Treatment/Interventions Aquatic Therapy;Gait training;Stair training;Functional mobility training;Therapeutic activities;Therapeutic exercise;Balance training;Patient/family education;Manual techniques    PT Next Visit Plan begin sitting postural exercise, sitting scapularand cervical  retraction, combo ab/glut sed, W band, hip adduction isometric, heel raises and bridging progress to hip excursion, t-band decompression and postural exercises.    PT Home Exercise Plan supine: decompression 1-5 as well as ab/glut combo. 10/19 LAQ, scap ret           Patient will benefit from skilled therapeutic intervention in order to improve the following deficits and impairments:  Abnormal gait, Decreased activity tolerance, Decreased balance, Decreased mobility, Decreased range of motion, Decreased strength, Difficulty walking, Hypomobility, Increased fascial restricitons, Impaired flexibility, Postural dysfunction, Pain, Obesity  Visit Diagnosis: Radiculopathy, lumbar region  Muscle weakness (generalized)  Difficulty in walking, not elsewhere classified     Problem List Patient Active Problem List   Diagnosis Date Noted  . Burping 06/27/2020  . Nausea without vomiting 06/27/2020  . Diarrhea 06/27/2020  . Trochanteric bursitis, left hip 04/21/2020  . NSTEMI (non-ST elevated myocardial infarction) ([place]) 01/13/2020  . Angina pectoris ([place]) 08/15/2018  . Chronic diastolic CHF (congestive heart failure) ([place]) 08/15/2018  . Essential hypertension 10/28/2017  . History of chronic CHF 10/28/2017  .  PCOS (polycystic ovarian syndrome) 10/28/2017  . Diabetic neuropathy, painful ([place]) 10/28/2017  . Chronic back pain 10/28/2017  . Coronary artery disease   . DM2 (diabetes mellitus, type 2) ([place]) 10/10/2011  . History of acute anterior wall MI 10/09/2011  . Morbid obesity ([place]) 10/09/2011  .  Tobacco abuse 10/09/2011  . Hyperlipidemia 10/09/2011    2:49 PM, 07/15/20 Mearl Latin PT, DPT Physical Therapist at [place], [place], [address] Phone: [phone]   Fax:  [phone]  Name: [name] MRN: [id number] Date of Birth: 1981/06/08

## 2020-07-17 NOTE — Telephone Encounter (Signed)
Called pt to schedule. Spouse answered and would not allow me to speak to patient. He reports he is on her DPR and I can schedule the procedure with him. Patient scheduled for 1/31 at 10:30am. Aware will mail prep instructions with pre-op/covid test appt.

## 2020-07-22 ENCOUNTER — Encounter (HOSPITAL_COMMUNITY): Payer: Self-pay

## 2020-07-22 ENCOUNTER — Other Ambulatory Visit: Payer: Self-pay | Admitting: Family Medicine

## 2020-07-22 ENCOUNTER — Ambulatory Visit (HOSPITAL_COMMUNITY): Payer: Medicaid Other

## 2020-07-22 ENCOUNTER — Other Ambulatory Visit: Payer: Self-pay

## 2020-07-22 DIAGNOSIS — R110 Nausea: Secondary | ICD-10-CM

## 2020-07-22 DIAGNOSIS — R262 Difficulty in walking, not elsewhere classified: Secondary | ICD-10-CM | POA: Diagnosis not present

## 2020-07-22 DIAGNOSIS — M6281 Muscle weakness (generalized): Secondary | ICD-10-CM

## 2020-07-22 DIAGNOSIS — N182 Chronic kidney disease, stage 2 (mild): Secondary | ICD-10-CM

## 2020-07-22 DIAGNOSIS — M5416 Radiculopathy, lumbar region: Secondary | ICD-10-CM | POA: Diagnosis not present

## 2020-07-22 DIAGNOSIS — R142 Eructation: Secondary | ICD-10-CM

## 2020-07-22 DIAGNOSIS — E1122 Type 2 diabetes mellitus with diabetic chronic kidney disease: Secondary | ICD-10-CM

## 2020-07-22 NOTE — Therapy (Signed)
[place], [place], [address] Phone: [phone]   Fax:  [phone]  Physical Therapy Treatment  Patient Details  Name: [name] MRN: [id number] Date of Birth: [birth year] Referring Provider (PT): Kary Kos   Encounter Date: 07/22/2020   PT End of Session - 07/22/20 1547    Visit Number 3    Number of Visits 12    Date for PT Re-Evaluation 08/15/20    Authorization Type healthy blue    Authorization Time Period 12 visits approved from 10/08-->08/15/20    Authorization - Visit Number 3    Authorization - Number of Visits 12    Progress Note Due on Visit 12    PT Start Time 1542   pt late for apt   PT Stop Time 1612    PT Time Calculation (min) 30 min    Activity Tolerance Patient tolerated treatment well    Behavior During Therapy [facility] for tasks assessed/performed           Past Medical History:  Diagnosis Date  . Allergy   . Anemia   . Anxiety   . CHF (congestive heart failure) ([place])   . Chronic kidney disease    history of kidney stones  . Coronary artery disease 09/2011   a.s/p BMS to LAD and angioplasty of RI in 09/2011 b. patent stent by cath in 02/2012, 12/2013, and 02/2016 with most recent showing 30-40% dLAD and 30-40% RCA stenosis  . Depression   . Diabetes mellitus   . Hyperlipidemia   . Hypertension   . Midsternal chest pain 03/03/2012  . Migraines   . Morbid obesity ([place])   . Myocardial infarct ([place]) 09/2011  . Myocardial infarction ([place]) 02/2016  . Palpitations 02/17/2012  . Sleep apnea   . Ureteral stone with hydronephrosis   . Urinary tract obstruction due to kidney stone 02/15/2017    Past Surgical History:  Procedure Laterality Date  . CARDIAC CATHETERIZATION  10/08/2011   LAD: 95% mid, Ramus: 95% ostial  . CARPAL TUNNEL RELEASE    . CESAREAN SECTION    . CHOLECYSTECTOMY    . CORONARY ANGIOPLASTY WITH STENT PLACEMENT  10/08/2011   LAD: BMS, Ramus: cutting balloon  angioplasty  . CORONARY STENT INTERVENTION N/A 01/14/2020   Procedure: CORONARY STENT INTERVENTION;  Surgeon: Leonie Man, MD;  Location: [place] [facility];  Service: Cardiovascular;  Laterality: N/A;  . CYSTOSCOPY WITH RETROGRADE PYELOGRAM, URETEROSCOPY AND STENT PLACEMENT Left 02/16/2017   Procedure: CYSTOSCOPY WITH RETROGRADE PYELOGRAM, URETEROSCOPY AND STENT PLACEMENT;  Surgeon: Kathie Rhodes, MD;  [facility];  Service: Urology;  Laterality: Left;  . CYSTOSCOPY WITH RETROGRADE PYELOGRAM, URETEROSCOPY AND STENT PLACEMENT Left 03/28/2017   Procedure: LEFT STENT REMOVAL LEFT RETROGRADE PYELOGRAM LEFT URETEROSCOPY   LASER LITHOTRIPSY  AND  STENT REPLACEMENT;  Surgeon: Cleon Gustin, MD;  [facility];  Service: Urology;  Laterality: Left;  . LEFT HEART CATH AND CORONARY ANGIOGRAPHY N/A 08/15/2018   Procedure: LEFT HEART CATH AND CORONARY ANGIOGRAPHY;  Surgeon: Martinique, Peter M, MD;  Location: [place] [facility];  Service: Cardiovascular;  Laterality: N/A;  . LEFT HEART CATH AND CORONARY ANGIOGRAPHY N/A 01/14/2020   Procedure: LEFT HEART CATH AND CORONARY ANGIOGRAPHY;  Surgeon: Leonie Man, MD;  Location: [place] [facility];  Service: Cardiovascular;  Laterality: N/A;  . LEFT HEART CATHETERIZATION WITH CORONARY ANGIOGRAM N/A 10/08/2011   Procedure: LEFT HEART CATHETERIZATION WITH CORONARY ANGIOGRAM;  Surgeon: Collier Salina  M Martinique, MD;  Location: [facility] CATH LAB;  Service: Cardiovascular;  Laterality: N/A;  . LEFT HEART CATHETERIZATION WITH CORONARY ANGIOGRAM N/A 03/02/2012   Procedure: LEFT HEART CATHETERIZATION WITH CORONARY ANGIOGRAM;  Surgeon: Burnell Blanks, MD;  Location: [facility] CATH LAB;  Service: Cardiovascular;  Laterality: N/A;  . LEFT HEART CATHETERIZATION WITH CORONARY ANGIOGRAM N/A 01/04/2014   Procedure: LEFT HEART CATHETERIZATION WITH CORONARY ANGIOGRAM;  Surgeon: Troy Sine, MD;  Location: [facility] CATH LAB;  Service: Cardiovascular;  Laterality: N/A;    There were no vitals  filed for this visit.   Subjective Assessment - 07/22/20 1544    Subjective Pt reports she continues to have pain, reports its more a throbbing pain vs the "pinching" feeling.  Reports she has a lot to do today and wishes for the session to end early.    Pertinent History DM, HTN, CHF,    Patient Stated Goals less pain , to be able walk better, get in and out of car easier    Currently in Pain? Yes    Pain Score 5     Pain Location Back    Pain Orientation Lower    Pain Descriptors / Indicators Throbbing    Pain Type Acute pain    Pain Onset More than a month ago    Pain Frequency Constant    Aggravating Factors  any activity    Pain Relieving Factors nothing at this time    Effect of Pain on Daily Activities limits                             [facility] Adult PT Treatment/Exercise - 07/22/20 0001      Posture/Postural Control   Posture/Postural Control Postural limitations    Postural Limitations Rounded Shoulders;Forward head;Decreased lumbar lordosis;Increased thoracic kyphosis;Anterior pelvic tilt;Flexed trunk    Posture Comments protruding abdominal       Exercises   Exercises Lumbar      Lumbar Exercises: Seated   Other Seated Lumbar Exercises cervical retraction     Other Seated Lumbar Exercises ab and glut set      Lumbar Exercises: Supine   Other Supine Lumbar Exercises GTB decompression 1-4 5 reps 3" holds each                    PT Short Term Goals - 07/04/20 1510      PT SHORT TERM GOAL #1   Title Pt to be I in HEP to improve pain level to no greater than a 6/10    Time 3    Period Weeks    Status New    Target Date 07/25/20      PT SHORT TERM GOAL #2   Title PT back and hip mobilty to improve to allow pt to be able to put her shoes and socks on with ease.    Time 3    Period Weeks    Status New      PT SHORT TERM GOAL #3   Title PT to core strength to have improved to allow pt to be able to ride in a car for 30 mintues without  having increased pain to allow pt to go to appointments without discomfort.             PT Long Term Goals - 07/04/20 1512      PT LONG TERM GOAL #1   Title Pt to be I in an  advance HEP to allow pt to be able to decrease her back pain to no greater than a 3/10    Time 6    Period Weeks    Status New    Target Date 08/15/20      PT LONG TERM GOAL #2   Title PT to improve her hip and lumbar mobility to be able to reach down and pick items off of the ground.    Time 6    Period Weeks    Status New      PT LONG TERM GOAL #3   Title PT  core and LE strength to increase to allow pt to be able to come sit to stand without the use of her UE and without increased pain.    Time 6    Period Weeks    Status New      PT LONG TERM GOAL #4   Title Pt to be able to single leg stance on both LE so  she can walk in her home without feeling as if she needs to furniture walk .    Time 6    Period Weeks    Status New      PT LONG TERM GOAL #5   Title PT to be able to walk for 30 mintues in order to complete grocery shopping without her back pain increasing.    Time 3    Period Weeks    Status New                 Plan - 07/22/20 1553    Clinical Impression Statement Pt late for apt, limited session time.  Progressed postural strenghtening with additional decompression exercises, some verbal and tactile cueing required for proper form, mechanics and to continue breathing.  Reviewed importance of seated posture, minimal cueing required with cervical retractoin.    Personal Factors and Comorbidities Comorbidity 3+    Examination-Activity Limitations Bed Mobility;Caring for Others;Carry;Dressing;Hygiene/Grooming;Locomotion Level;Stairs;Squat    Examination-Participation Restrictions Cleaning;Community Activity;Laundry;Driving;Meal Prep    Stability/Clinical Decision Making Evolving/Moderate complexity    Clinical Decision Making Moderate    Rehab Potential Good    PT Frequency 2x /  week    PT Duration 6 weeks    PT Treatment/Interventions Aquatic Therapy;Gait training;Stair training;Functional mobility training;Therapeutic activities;Therapeutic exercise;Balance training;Patient/family education;Manual techniques    PT Next Visit Plan Continue sitting postural exercise, sitting scapularand cervical  retraction, combo ab/glut sed, W band, hip adduction isometric, heel raises and bridging progress to hip excursion, t-band decompression and postural exercises.    PT Home Exercise Plan supine: decompression 1-5 as well as ab/glut combo. 10/19 LAQ, scap ret; 07/22/20: GTB decompression 1-4           Patient will benefit from skilled therapeutic intervention in order to improve the following deficits and impairments:  Abnormal gait, Decreased activity tolerance, Decreased balance, Decreased mobility, Decreased range of motion, Decreased strength, Difficulty walking, Hypomobility, Increased fascial restricitons, Impaired flexibility, Postural dysfunction, Pain, Obesity  Visit Diagnosis: Difficulty in walking, not elsewhere classified  Muscle weakness (generalized)  Radiculopathy, lumbar region     Problem List Patient Active Problem List   Diagnosis Date Noted  . Burping 06/27/2020  . Nausea without vomiting 06/27/2020  . Diarrhea 06/27/2020  . Trochanteric bursitis, left hip 04/21/2020  . NSTEMI (non-ST elevated myocardial infarction) ([place]) 01/13/2020  . Angina pectoris ([place]) 08/15/2018  . Chronic diastolic CHF (congestive heart failure) ([place]) 08/15/2018  . Essential hypertension 10/28/2017  . History of chronic  CHF 10/28/2017  . PCOS (polycystic ovarian syndrome) 10/28/2017  . Diabetic neuropathy, painful ([place]) 10/28/2017  . Chronic back pain 10/28/2017  . Coronary artery disease   . DM2 (diabetes mellitus, type 2) ([place]) 10/10/2011  . History of acute anterior wall MI 10/09/2011  . Morbid obesity ([place]) 10/09/2011  . Tobacco abuse 10/09/2011  .  Hyperlipidemia 10/09/2011   Ihor Austin, LPTA/CLT; CBIS [phone]  Aldona Lento 07/22/2020, 4:59 PM  [place], [place], [address] Phone: [phone]   Fax:  [phone]  Name: [name] MRN: [id number] Date of Birth: 26-Dec-1980

## 2020-07-24 ENCOUNTER — Telehealth (HOSPITAL_COMMUNITY): Payer: Self-pay | Admitting: Physical Therapy

## 2020-07-24 ENCOUNTER — Ambulatory Visit (HOSPITAL_COMMUNITY): Payer: Medicaid Other | Admitting: Physical Therapy

## 2020-07-24 NOTE — Telephone Encounter (Signed)
pt called to cx this appt due to she has been up all night

## 2020-07-29 ENCOUNTER — Other Ambulatory Visit: Payer: Self-pay

## 2020-07-29 ENCOUNTER — Ambulatory Visit (HOSPITAL_COMMUNITY): Payer: Medicaid Other | Attending: Neurosurgery | Admitting: Physical Therapy

## 2020-07-29 DIAGNOSIS — M6281 Muscle weakness (generalized): Secondary | ICD-10-CM | POA: Diagnosis not present

## 2020-07-29 DIAGNOSIS — R262 Difficulty in walking, not elsewhere classified: Secondary | ICD-10-CM | POA: Insufficient documentation

## 2020-07-29 DIAGNOSIS — M5416 Radiculopathy, lumbar region: Secondary | ICD-10-CM

## 2020-07-29 NOTE — Therapy (Signed)
[place], [place], [address] Phone: [phone]   Fax:  [phone]  Physical Therapy Treatment  Patient Details  Name: [name] MRN: [id number] Date of Birth: [birth year] Referring Provider (PT): Kary Kos   Encounter Date: 07/29/2020   PT End of Session - 07/29/20 1620    Visit Number 4    Number of Visits 12    Date for PT Re-Evaluation 08/15/20    Authorization Type healthy blue    Authorization Time Period 12 visits approved from 10/08-->08/15/20    Authorization - Visit Number 4    Authorization - Number of Visits 12    Progress Note Due on Visit 12    PT Start Time 1538    PT Stop Time 1618    PT Time Calculation (min) 40 min    Activity Tolerance Patient tolerated treatment well    Behavior During Therapy [facility] for tasks assessed/performed           Past Medical History:  Diagnosis Date  . Allergy   . Anemia   . Anxiety   . CHF (congestive heart failure) ([place])   . Chronic kidney disease    history of kidney stones  . Coronary artery disease 09/2011   a.s/p BMS to LAD and angioplasty of RI in 09/2011 b. patent stent by cath in 02/2012, 12/2013, and 02/2016 with most recent showing 30-40% dLAD and 30-40% RCA stenosis  . Depression   . Diabetes mellitus   . Hyperlipidemia   . Hypertension   . Midsternal chest pain 03/03/2012  . Migraines   . Morbid obesity ([place])   . Myocardial infarct ([place]) 09/2011  . Myocardial infarction ([place]) 02/2016  . Palpitations 02/17/2012  . Sleep apnea   . Ureteral stone with hydronephrosis   . Urinary tract obstruction due to kidney stone 02/15/2017    Past Surgical History:  Procedure Laterality Date  . CARDIAC CATHETERIZATION  10/08/2011   LAD: 95% mid, Ramus: 95% ostial  . CARPAL TUNNEL RELEASE    . CESAREAN SECTION    . CHOLECYSTECTOMY    . CORONARY ANGIOPLASTY WITH STENT PLACEMENT  10/08/2011   LAD: BMS, Ramus: cutting balloon angioplasty  .  CORONARY STENT INTERVENTION N/A 01/14/2020   Procedure: CORONARY STENT INTERVENTION;  Surgeon: Leonie Man, MD;  Location: [place] [facility];  Service: Cardiovascular;  Laterality: N/A;  . CYSTOSCOPY WITH RETROGRADE PYELOGRAM, URETEROSCOPY AND STENT PLACEMENT Left 02/16/2017   Procedure: CYSTOSCOPY WITH RETROGRADE PYELOGRAM, URETEROSCOPY AND STENT PLACEMENT;  Surgeon: Kathie Rhodes, MD;  [facility];  Service: Urology;  Laterality: Left;  . CYSTOSCOPY WITH RETROGRADE PYELOGRAM, URETEROSCOPY AND STENT PLACEMENT Left 03/28/2017   Procedure: LEFT STENT REMOVAL LEFT RETROGRADE PYELOGRAM LEFT URETEROSCOPY   LASER LITHOTRIPSY  AND  STENT REPLACEMENT;  Surgeon: Cleon Gustin, MD;  Location: [facility];  Service: Urology;  Laterality: Left;  . LEFT HEART CATH AND CORONARY ANGIOGRAPHY N/A 08/15/2018   Procedure: LEFT HEART CATH AND CORONARY ANGIOGRAPHY;  Surgeon: Martinique, Peter M, MD;  Location: [place] [facility];  Service: Cardiovascular;  Laterality: N/A;  . LEFT HEART CATH AND CORONARY ANGIOGRAPHY N/A 01/14/2020   Procedure: LEFT HEART CATH AND CORONARY ANGIOGRAPHY;  Surgeon: Leonie Man, MD;  Location: [place] CV LAB;  Service: Cardiovascular;  Laterality: N/A;  . LEFT HEART CATHETERIZATION WITH CORONARY ANGIOGRAM N/A 10/08/2011   Procedure: LEFT HEART CATHETERIZATION WITH CORONARY ANGIOGRAM;  Surgeon: Peter M Martinique, MD;  Location:  [place] CATH LAB;  Service: Cardiovascular;  Laterality: N/A;  . LEFT HEART CATHETERIZATION WITH CORONARY ANGIOGRAM N/A 03/02/2012   Procedure: LEFT HEART CATHETERIZATION WITH CORONARY ANGIOGRAM;  Surgeon: Burnell Blanks, MD;  [facility] CATH LAB;  Service: Cardiovascular;  Laterality: N/A;  . LEFT HEART CATHETERIZATION WITH CORONARY ANGIOGRAM N/A 01/04/2014   Procedure: LEFT HEART CATHETERIZATION WITH CORONARY ANGIOGRAM;  Surgeon: Troy Sine, MD;  Location: [facility] CATH LAB;  Service: Cardiovascular;  Laterality: N/A;    There were no vitals filed for this  visit.   Subjective Assessment - 07/29/20 1541    Subjective pt states her pain is unchanged 4/10.  States something popped night before last when she got off the cart at [place] and hurt all night after that.    Currently in Pain? Yes    Pain Score 4     Pain Location Back    Pain Orientation Lower    Pain Descriptors / Indicators Throbbing    Pain Type Acute pain                             [facility] Adult PT Treatment/Exercise - 07/29/20 0001      Lumbar Exercises: Stretches   Lower Trunk Rotation 5 reps;10 seconds      Lumbar Exercises: Seated   Long Arc Quad on Chair Both;1 set;10 reps    Sit to Stand 10 reps;Limitations    Sit to Stand Limitations standard height no UE's    Other Seated Lumbar Exercises cervical retraction 15"       Lumbar Exercises: Supine   Ab Set 10 reps    AB Set Limitations with glute set                     PT Short Term Goals - 07/04/20 1510      PT SHORT TERM GOAL #1   Title Pt to be I in HEP to improve pain level to no greater than a 6/10    Time 3    Period Weeks    Status New    Target Date 07/25/20      PT SHORT TERM GOAL #2   Title PT back and hip mobilty to improve to allow pt to be able to put her shoes and socks on with ease.    Time 3    Period Weeks    Status New      PT SHORT TERM GOAL #3   Title PT to core strength to have improved to allow pt to be able to ride in a car for 30 mintues without having increased pain to allow pt to go to appointments without discomfort.             PT Long Term Goals - 07/04/20 1512      PT LONG TERM GOAL #1   Title Pt to be I in an advance HEP to allow pt to be able to decrease her back pain to no greater than a 3/10    Time 6    Period Weeks    Status New    Target Date 08/15/20      PT LONG TERM GOAL #2   Title PT to improve her hip and lumbar mobility to be able to reach down and pick items off of the ground.    Time 6    Period Weeks    Status New  PT LONG TERM GOAL #3   Title PT  core and LE strength to increase to allow pt to be able to come sit to stand without the use of her UE and without increased pain.    Time 6    Period Weeks    Status New      PT LONG TERM GOAL #4   Title Pt to be able to single leg stance on both LE so  she can walk in her home without feeling as if she needs to furniture walk .    Time 6    Period Weeks    Status New      PT LONG TERM GOAL #5   Title PT to be able to walk for 30 mintues in order to complete grocery shopping without her back pain increasing.    Time 3    Period Weeks    Status New                 Plan - 07/29/20 1618    Clinical Impression Statement Continued with LE strengthening and core stabilization.  Cues both verbal and tactile to complete exercises correctly this session.  Pt with poor control and stabilization requiring cues for breathing and rest breaks between exercises.  Began sit to stands today with audible crepitus in knees.  Able to complete this without UE's and cues for eccentric lowering.    Personal Factors and Comorbidities Comorbidity 3+    Examination-Activity Limitations Bed Mobility;Caring for Others;Carry;Dressing;Hygiene/Grooming;Locomotion Level;Stairs;Squat    Examination-Participation Restrictions Cleaning;Community Activity;Laundry;Driving;Meal Prep    Stability/Clinical Decision Making Evolving/Moderate complexity    Rehab Potential Good    PT Frequency 2x / week    PT Duration 6 weeks    PT Treatment/Interventions Aquatic Therapy;Gait training;Stair training;Functional mobility training;Therapeutic activities;Therapeutic exercise;Balance training;Patient/family education;Manual techniques    PT Next Visit Plan Continue  postural exercises.  next session progress to hip excursion, t-band exercises.    PT Home Exercise Plan supine: decompression 1-5 as well as ab/glut combo. 10/19 LAQ, scap ret; 07/22/20: GTB decompression 1-4            Patient will benefit from skilled therapeutic intervention in order to improve the following deficits and impairments:  Abnormal gait, Decreased activity tolerance, Decreased balance, Decreased mobility, Decreased range of motion, Decreased strength, Difficulty walking, Hypomobility, Increased fascial restricitons, Impaired flexibility, Postural dysfunction, Pain, Obesity  Visit Diagnosis: Difficulty in walking, not elsewhere classified  Muscle weakness (generalized)  Radiculopathy, lumbar region     Problem List Patient Active Problem List   Diagnosis Date Noted  . Burping 06/27/2020  . Nausea without vomiting 06/27/2020  . Diarrhea 06/27/2020  . Trochanteric bursitis, left hip 04/21/2020  . NSTEMI (non-ST elevated myocardial infarction) ([place]) 01/13/2020  . Angina pectoris ([place]) 08/15/2018  . Chronic diastolic CHF (congestive heart failure) ([place]) 08/15/2018  . Essential hypertension 10/28/2017  . History of chronic CHF 10/28/2017  . PCOS (polycystic ovarian syndrome) 10/28/2017  . Diabetic neuropathy, painful ([place]) 10/28/2017  . Chronic back pain 10/28/2017  . Coronary artery disease   . DM2 (diabetes mellitus, type 2) ([place]) 10/10/2011  . History of acute anterior wall MI 10/09/2011  . Morbid obesity ([place]) 10/09/2011  . Tobacco abuse 10/09/2011  . Hyperlipidemia 10/09/2011   Teena Irani, PTA/CLT [phone]  Teena Irani 07/29/2020, 4:20 PM  [place] [address] [place], [place], [address] Phone: [phone]   Fax:  [phone]  Name: Teanna Elem  [place] MRN: [id number] Date of Birth: 1981/01/13

## 2020-07-31 ENCOUNTER — Other Ambulatory Visit: Payer: Self-pay

## 2020-07-31 ENCOUNTER — Encounter (HOSPITAL_COMMUNITY): Payer: Self-pay | Admitting: Physical Therapy

## 2020-07-31 ENCOUNTER — Ambulatory Visit (HOSPITAL_COMMUNITY): Payer: Medicaid Other | Admitting: Physical Therapy

## 2020-07-31 DIAGNOSIS — M5416 Radiculopathy, lumbar region: Secondary | ICD-10-CM | POA: Diagnosis not present

## 2020-07-31 DIAGNOSIS — M6281 Muscle weakness (generalized): Secondary | ICD-10-CM

## 2020-07-31 DIAGNOSIS — R262 Difficulty in walking, not elsewhere classified: Secondary | ICD-10-CM

## 2020-07-31 NOTE — Patient Instructions (Signed)
Access Code: [email or web address] URL: [email or web address] Date: 07/31/2020 Prepared by: Georges Lynch  Exercises Supine Transversus Abdominis Bracing with Heel Slide - 2 x daily - 7 x weekly - 2 sets - 10 reps Bent Knee Fallouts - 2 x daily - 7 x weekly - 2 sets - 10 reps

## 2020-07-31 NOTE — Therapy (Signed)
[place], [place], [address] Phone: [phone]   Fax:  [phone]  Physical Therapy Treatment  Patient Details  Name: [name] MRN: [id number] Date of Birth: Dec 21, 1980 Referring Provider (PT): Kary Kos   Encounter Date: 07/31/2020   PT End of Session - 07/31/20 1409    Visit Number 5    Number of Visits 12    Date for PT Re-Evaluation 08/15/20    Authorization Type healthy blue    Authorization Time Period 12 visits approved from 10/08-->08/15/20    Authorization - Visit Number 5    Authorization - Number of Visits 12    Progress Note Due on Visit 12    PT Start Time 7225    PT Stop Time 1450    PT Time Calculation (min) 45 min    Activity Tolerance Patient tolerated treatment well    Behavior During Therapy [facility] for tasks assessed/performed           Past Medical History:  Diagnosis Date  . Allergy   . Anemia   . Anxiety   . CHF (congestive heart failure) ([place])   . Chronic kidney disease    history of kidney stones  . Coronary artery disease 09/2011   a.s/p BMS to LAD and angioplasty of RI in 09/2011 b. patent stent by cath in 02/2012, 12/2013, and 02/2016 with most recent showing 30-40% dLAD and 30-40% RCA stenosis  . Depression   . Diabetes mellitus   . Hyperlipidemia   . Hypertension   . Midsternal chest pain 03/03/2012  . Migraines   . Morbid obesity ([place])   . Myocardial infarct ([place]) 09/2011  . Myocardial infarction ([place]) 02/2016  . Palpitations 02/17/2012  . Sleep apnea   . Ureteral stone with hydronephrosis   . Urinary tract obstruction due to kidney stone 02/15/2017    Past Surgical History:  Procedure Laterality Date  . CARDIAC CATHETERIZATION  10/08/2011   LAD: 95% mid, Ramus: 95% ostial  . CARPAL TUNNEL RELEASE    . CESAREAN SECTION    . CHOLECYSTECTOMY    . CORONARY ANGIOPLASTY WITH STENT PLACEMENT  10/08/2011   LAD: BMS, Ramus: cutting balloon angioplasty  .  CORONARY STENT INTERVENTION N/A 01/14/2020   Procedure: CORONARY STENT INTERVENTION;  Surgeon: Leonie Man, MD;  Location: [place] [facility];  Service: Cardiovascular;  Laterality: N/A;  . CYSTOSCOPY WITH RETROGRADE PYELOGRAM, URETEROSCOPY AND STENT PLACEMENT Left 02/16/2017   Procedure: CYSTOSCOPY WITH RETROGRADE PYELOGRAM, URETEROSCOPY AND STENT PLACEMENT;  Surgeon: Kathie Rhodes, MD;  [facility];  Service: Urology;  Laterality: Left;  . CYSTOSCOPY WITH RETROGRADE PYELOGRAM, URETEROSCOPY AND STENT PLACEMENT Left 03/28/2017   Procedure: LEFT STENT REMOVAL LEFT RETROGRADE PYELOGRAM LEFT URETEROSCOPY   LASER LITHOTRIPSY  AND  STENT REPLACEMENT;  Surgeon: Cleon Gustin, MD;  [facility];  Service: Urology;  Laterality: Left;  . LEFT HEART CATH AND CORONARY ANGIOGRAPHY N/A 08/15/2018   Procedure: LEFT HEART CATH AND CORONARY ANGIOGRAPHY;  Surgeon: Martinique, Peter M, MD;  Location: [place] [facility];  Service: Cardiovascular;  Laterality: N/A;  . LEFT HEART CATH AND CORONARY ANGIOGRAPHY N/A 01/14/2020   Procedure: LEFT HEART CATH AND CORONARY ANGIOGRAPHY;  Surgeon: Leonie Man, MD;  Location: [place] [facility];  Service: Cardiovascular;  Laterality: N/A;  . LEFT HEART CATHETERIZATION WITH CORONARY ANGIOGRAM N/A 10/08/2011   Procedure: LEFT HEART CATHETERIZATION WITH CORONARY ANGIOGRAM;  Surgeon: Peter M Martinique, MD;  Location:  [place] [facility];  Service: Cardiovascular;  Laterality: N/A;  . LEFT HEART CATHETERIZATION WITH CORONARY ANGIOGRAM N/A 03/02/2012   Procedure: LEFT HEART CATHETERIZATION WITH CORONARY ANGIOGRAM;  Surgeon: Burnell Blanks, MD;  Location: [facility] CATH LAB;  Service: Cardiovascular;  Laterality: N/A;  . LEFT HEART CATHETERIZATION WITH CORONARY ANGIOGRAM N/A 01/04/2014   Procedure: LEFT HEART CATHETERIZATION WITH CORONARY ANGIOGRAM;  Surgeon: Troy Sine, MD;  Location: [facility] CATH LAB;  Service: Cardiovascular;  Laterality: N/A;    There were no vitals filed for this  visit.   Subjective Assessment - 07/31/20 1409    Subjective Patient says her back is [place] about a 4. Say she is afraid to bend over for fear of getting stuck like she did at [facility]. Says exercises are going well.    Currently in Pain? Yes    Pain Score 4     Pain Location Back    Pain Orientation Lower    Pain Descriptors / Indicators Throbbing    Pain Type Acute pain    Pain Onset More than a month ago    Pain Frequency Constant                             [facility] Adult PT Treatment/Exercise - 07/31/20 0001      Lumbar Exercises: Stretches   Lower Trunk Rotation Other (comment)   10 x each, cues for pain free range    Lower Trunk Rotation Limitations 10 x each, cues for pain free range       Lumbar Exercises: Supine   Clam 15 reps    Heel Slides 15 reps    Bridge 15 reps    Straight Leg Raise 10 reps    Straight Leg Raises Limitations with ab set                     PT Short Term Goals - 07/04/20 1510      PT SHORT TERM GOAL #1   Title Pt to be I in HEP to improve pain level to no greater than a 6/10    Time 3    Period Weeks    Status New    Target Date 07/25/20      PT SHORT TERM GOAL #2   Title PT back and hip mobilty to improve to allow pt to be able to put her shoes and socks on with ease.    Time 3    Period Weeks    Status New      PT SHORT TERM GOAL #3   Title PT to core strength to have improved to allow pt to be able to ride in a car for 30 mintues without having increased pain to allow pt to go to appointments without discomfort.             PT Long Term Goals - 07/04/20 1512      PT LONG TERM GOAL #1   Title Pt to be I in an advance HEP to allow pt to be able to decrease her back pain to no greater than a 3/10    Time 6    Period Weeks    Status New    Target Date 08/15/20      PT LONG TERM GOAL #2   Title PT to improve her hip and lumbar mobility to be able to reach down and pick items off of the ground.  Time 6    Period Weeks    Status New      PT LONG TERM GOAL #3   Title PT  core and LE strength to increase to allow pt to be able to come sit to stand without the use of her UE and without increased pain.    Time 6    Period Weeks    Status New      PT LONG TERM GOAL #4   Title Pt to be able to single leg stance on both LE so  she can walk in her home without feeling as if she needs to furniture walk .    Time 6    Period Weeks    Status New      PT LONG TERM GOAL #5   Title PT to be able to walk for 30 mintues in order to complete grocery shopping without her back pain increasing.    Time 3    Period Weeks    Status New                 Plan - 07/31/20 1457    Clinical Impression Statement Patient tolerated session well overall today. Patient performs activity in slow, labored manner. Requires verbal cues to perform activities through pain free range. Patient requires cues for core activation during SLR and clamshells. Added heel slides and supine clams for core strength progression per patient tolerance. Patient educated on proper form and function of all added activity. Patient issued updated HEP handout. Patient will continue to benefit from skilled therapy to progress core strength and lumbar mobility to reduce pain and improve LOF with ADLs.    Personal Factors and Comorbidities Comorbidity 3+    Examination-Activity Limitations Bed Mobility;Caring for Others;Carry;Dressing;Hygiene/Grooming;Locomotion Level;Stairs;Squat    Examination-Participation Restrictions Cleaning;Community Activity;Laundry;Driving;Meal Prep    Stability/Clinical Decision Making Evolving/Moderate complexity    Rehab Potential Good    PT Frequency 2x / week    PT Duration 6 weeks    PT Treatment/Interventions Aquatic Therapy;Gait training;Stair training;Functional mobility training;Therapeutic activities;Therapeutic exercise;Balance training;Patient/family education;Manual techniques    PT Next  Visit Plan Continue to postural exercises. Next session progress to hip excursion, t-band exercises.    PT Home Exercise Plan supine: decompression 1-5 as well as ab/glut combo. 10/19 LAQ, scap ret; 07/22/20: GTB decompression 1-4 07/31/20: heel slides, supine clams           Patient will benefit from skilled therapeutic intervention in order to improve the following deficits and impairments:  Abnormal gait, Decreased activity tolerance, Decreased balance, Decreased mobility, Decreased range of motion, Decreased strength, Difficulty walking, Hypomobility, Increased fascial restricitons, Impaired flexibility, Postural dysfunction, Pain, Obesity  Visit Diagnosis: Difficulty in walking, not elsewhere classified  Muscle weakness (generalized)  Radiculopathy, lumbar region     Problem List Patient Active Problem List   Diagnosis Date Noted  . Burping 06/27/2020  . Nausea without vomiting 06/27/2020  . Diarrhea 06/27/2020  . Trochanteric bursitis, left hip 04/21/2020  . NSTEMI (non-ST elevated myocardial infarction) ([place]) 01/13/2020  . Angina pectoris ([place]) 08/15/2018  . Chronic diastolic CHF (congestive heart failure) ([place]) 08/15/2018  . Essential hypertension 10/28/2017  . History of chronic CHF 10/28/2017  . PCOS (polycystic ovarian syndrome) 10/28/2017  . Diabetic neuropathy, painful ([place]) 10/28/2017  . Chronic back pain 10/28/2017  . Coronary artery disease   . DM2 (diabetes mellitus, type 2) ([place]) 10/10/2011  . History of acute anterior wall MI 10/09/2011  . Morbid obesity ([place]) 10/09/2011  .  Tobacco abuse 10/09/2011  . Hyperlipidemia 10/09/2011   2:58 PM, 07/31/20 Josue Hector PT DPT  Physical Therapist with [facility]  [phone] [place], [place], [address] Phone: [phone]   Fax:  [phone]  Name: [name] MRN: [id number] Date of Birth:  07-24-1981

## 2020-08-05 ENCOUNTER — Other Ambulatory Visit: Payer: Self-pay

## 2020-08-05 ENCOUNTER — Ambulatory Visit (HOSPITAL_COMMUNITY): Payer: Medicaid Other | Admitting: Physical Therapy

## 2020-08-05 DIAGNOSIS — R262 Difficulty in walking, not elsewhere classified: Secondary | ICD-10-CM

## 2020-08-05 DIAGNOSIS — M6281 Muscle weakness (generalized): Secondary | ICD-10-CM

## 2020-08-05 DIAGNOSIS — M5416 Radiculopathy, lumbar region: Secondary | ICD-10-CM | POA: Diagnosis not present

## 2020-08-05 NOTE — Therapy (Signed)
[place], [place], [address] Phone: [phone]   Fax:  [phone]  Physical Therapy Treatment  Patient Details  Name: [name] MRN: [id number] Date of Birth: 02-Nov-1980 Referring Provider (PT): Kary Kos   Encounter Date: 08/05/2020   PT End of Session - 08/05/20 1105    Visit Number 6    Number of Visits 12    Date for PT Re-Evaluation 08/15/20    Authorization Type healthy blue    Authorization Time Period 12 visits approved from 10/08-->08/15/20    Authorization - Visit Number 6    Authorization - Number of Visits 12    Progress Note Due on Visit 12    PT Start Time 0922    PT Stop Time 1005    PT Time Calculation (min) 43 min    Activity Tolerance Patient tolerated treatment well    Behavior During Therapy [facility] for tasks assessed/performed           Past Medical History:  Diagnosis Date  . Allergy   . Anemia   . Anxiety   . CHF (congestive heart failure) ([place])   . Chronic kidney disease    history of kidney stones  . Coronary artery disease 09/2011   a.s/p BMS to LAD and angioplasty of RI in 09/2011 b. patent stent by cath in 02/2012, 12/2013, and 02/2016 with most recent showing 30-40% dLAD and 30-40% RCA stenosis  . Depression   . Diabetes mellitus   . Hyperlipidemia   . Hypertension   . Midsternal chest pain 03/03/2012  . Migraines   . Morbid obesity ([place])   . Myocardial infarct ([place]) 09/2011  . Myocardial infarction ([place]) 02/2016  . Palpitations 02/17/2012  . Sleep apnea   . Ureteral stone with hydronephrosis   . Urinary tract obstruction due to kidney stone 02/15/2017    Past Surgical History:  Procedure Laterality Date  . CARDIAC CATHETERIZATION  10/08/2011   LAD: 95% mid, Ramus: 95% ostial  . CARPAL TUNNEL RELEASE    . CESAREAN SECTION    . CHOLECYSTECTOMY    . CORONARY ANGIOPLASTY WITH STENT PLACEMENT  10/08/2011   LAD: BMS, Ramus: cutting balloon angioplasty  .  CORONARY STENT INTERVENTION N/A 01/14/2020   Procedure: CORONARY STENT INTERVENTION;  Surgeon: Leonie Man, MD;  Location: [place] [facility];  Service: Cardiovascular;  Laterality: N/A;  . CYSTOSCOPY WITH RETROGRADE PYELOGRAM, URETEROSCOPY AND STENT PLACEMENT Left 02/16/2017   Procedure: CYSTOSCOPY WITH RETROGRADE PYELOGRAM, URETEROSCOPY AND STENT PLACEMENT;  Surgeon: Kathie Rhodes, MD;  [facility];  Service: Urology;  Laterality: Left;  . CYSTOSCOPY WITH RETROGRADE PYELOGRAM, URETEROSCOPY AND STENT PLACEMENT Left 03/28/2017   Procedure: LEFT STENT REMOVAL LEFT RETROGRADE PYELOGRAM LEFT URETEROSCOPY   LASER LITHOTRIPSY  AND  STENT REPLACEMENT;  Surgeon: Cleon Gustin, MD;  Location: [facility];  Service: Urology;  Laterality: Left;  . LEFT HEART CATH AND CORONARY ANGIOGRAPHY N/A 08/15/2018   Procedure: LEFT HEART CATH AND CORONARY ANGIOGRAPHY;  Surgeon: Martinique, Peter M, MD;  Location: [place] [facility];  Service: Cardiovascular;  Laterality: N/A;  . LEFT HEART CATH AND CORONARY ANGIOGRAPHY N/A 01/14/2020   Procedure: LEFT HEART CATH AND CORONARY ANGIOGRAPHY;  Surgeon: Leonie Man, MD;  Location: [place] [facility];  Service: Cardiovascular;  Laterality: N/A;  . LEFT HEART CATHETERIZATION WITH CORONARY ANGIOGRAM N/A 10/08/2011   Procedure: LEFT HEART CATHETERIZATION WITH CORONARY ANGIOGRAM;  Surgeon: Peter M Martinique, MD;  Location:  [place] CATH LAB;  Service: Cardiovascular;  Laterality: N/A;  . LEFT HEART CATHETERIZATION WITH CORONARY ANGIOGRAM N/A 03/02/2012   Procedure: LEFT HEART CATHETERIZATION WITH CORONARY ANGIOGRAM;  Surgeon: Burnell Blanks, MD;  Location: [facility] CATH LAB;  Service: Cardiovascular;  Laterality: N/A;  . LEFT HEART CATHETERIZATION WITH CORONARY ANGIOGRAM N/A 01/04/2014   Procedure: LEFT HEART CATHETERIZATION WITH CORONARY ANGIOGRAM;  Surgeon: Troy Sine, MD;  Location: [facility] CATH LAB;  Service: Cardiovascular;  Laterality: N/A;    There were no vitals filed for this  visit.   Subjective Assessment - 08/05/20 1053    Subjective pt states her pain is around 3/10 today.    Currently in Pain? Yes    Pain Score 3     Pain Location Back    Pain Orientation Lower    Pain Descriptors / Indicators Throbbing                             [facility] Adult PT Treatment/Exercise - 08/05/20 0001      Lumbar Exercises: Stretches   Lower Trunk Rotation Other (comment)    Lower Trunk Rotation Limitations 10 x each, cues for pain free range       Lumbar Exercises: Seated   Sit to Stand 10 reps;Limitations    Sit to Stand Limitations standard height no UE's      Lumbar Exercises: Supine   Clam 15 reps    Heel Slides 15 reps    Bridge 15 reps    Straight Leg Raise 10 reps    Straight Leg Raises Limitations with ab set       Lumbar Exercises: Sidelying   Hip Abduction Both;10 reps                    PT Short Term Goals - 07/04/20 1510      PT SHORT TERM GOAL #1   Title Pt to be I in HEP to improve pain level to no greater than a 6/10    Time 3    Period Weeks    Status New    Target Date 07/25/20      PT SHORT TERM GOAL #2   Title PT back and hip mobilty to improve to allow pt to be able to put her shoes and socks on with ease.    Time 3    Period Weeks    Status New      PT SHORT TERM GOAL #3   Title PT to core strength to have improved to allow pt to be able to ride in a car for 30 mintues without having increased pain to allow pt to go to appointments without discomfort.             PT Long Term Goals - 07/04/20 1512      PT LONG TERM GOAL #1   Title Pt to be I in an advance HEP to allow pt to be able to decrease her back pain to no greater than a 3/10    Time 6    Period Weeks    Status New    Target Date 08/15/20      PT LONG TERM GOAL #2   Title PT to improve her hip and lumbar mobility to be able to reach down and pick items off of the ground.    Time 6    Period Weeks    Status New  PT LONG TERM  GOAL #3   Title PT  core and LE strength to increase to allow pt to be able to come sit to stand without the use of her UE and without increased pain.    Time 6    Period Weeks    Status New      PT LONG TERM GOAL #4   Title Pt to be able to single leg stance on both LE so  she can walk in her home without feeling as if she needs to furniture walk .    Time 6    Period Weeks    Status New      PT LONG TERM GOAL #5   Title PT to be able to walk for 30 mintues in order to complete grocery shopping without her back pain increasing.    Time 3    Period Weeks    Status New                 Plan - 08/05/20 1107    Clinical Impression Statement Pt with lower pain today.  Completed all established therex with added sidelying hip abduction.  Focus remains on core stability and Medici requires cues to isolate and stabilize.  Verbal and tactile cues needed with all exercises for control and form.  No change or increase in pain at end of session.  No complaints of pain with any activity during session.    Personal Factors and Comorbidities Comorbidity 3+    Examination-Activity Limitations Bed Mobility;Caring for Others;Carry;Dressing;Hygiene/Grooming;Locomotion Level;Stairs;Squat    Examination-Participation Restrictions Cleaning;Community Activity;Laundry;Driving;Meal Prep    Stability/Clinical Decision Making Evolving/Moderate complexity    Rehab Potential Good    PT Frequency 2x / week    PT Duration 6 weeks    PT Treatment/Interventions Aquatic Therapy;Gait training;Stair training;Functional mobility training;Therapeutic activities;Therapeutic exercise;Balance training;Patient/family education;Manual techniques    PT Next Visit Plan Continue to postural exercises. Next session progress to hip excursion, t-band exercises.    PT Home Exercise Plan supine: decompression 1-5 as well as ab/glut combo. 10/19 LAQ, scap ret; 07/22/20: GTB decompression 1-4 07/31/20: heel slides, supine clams            Patient will benefit from skilled therapeutic intervention in order to improve the following deficits and impairments:  Abnormal gait, Decreased activity tolerance, Decreased balance, Decreased mobility, Decreased range of motion, Decreased strength, Difficulty walking, Hypomobility, Increased fascial restricitons, Impaired flexibility, Postural dysfunction, Pain, Obesity  Visit Diagnosis: Difficulty in walking, not elsewhere classified  Muscle weakness (generalized)  Radiculopathy, lumbar region     Problem List Patient Active Problem List   Diagnosis Date Noted  . Burping 06/27/2020  . Nausea without vomiting 06/27/2020  . Diarrhea 06/27/2020  . Trochanteric bursitis, left hip 04/21/2020  . NSTEMI (non-ST elevated myocardial infarction) ([place]) 01/13/2020  . Angina pectoris ([place]) 08/15/2018  . Chronic diastolic CHF (congestive heart failure) ([place]) 08/15/2018  . Essential hypertension 10/28/2017  . History of chronic CHF 10/28/2017  . PCOS (polycystic ovarian syndrome) 10/28/2017  . Diabetic neuropathy, painful ([place]) 10/28/2017  . Chronic back pain 10/28/2017  . Coronary artery disease   . DM2 (diabetes mellitus, type 2) ([place]) 10/10/2011  . History of acute anterior wall MI 10/09/2011  . Morbid obesity ([place]) 10/09/2011  . Tobacco abuse 10/09/2011  . Hyperlipidemia 10/09/2011   Teena Irani, PTA/CLT [phone]  Roseanne Reno B 08/05/2020, 11:08 AM  [place], [place], [address] Phone:  [phone]   Fax:  [phone]  Name: [name] MRN: [id number] Date of Birth: [birth year]

## 2020-08-07 ENCOUNTER — Ambulatory Visit (HOSPITAL_COMMUNITY): Payer: Medicaid Other | Admitting: Physical Therapy

## 2020-08-07 ENCOUNTER — Encounter (HOSPITAL_COMMUNITY): Payer: Self-pay | Admitting: Physical Therapy

## 2020-08-07 ENCOUNTER — Other Ambulatory Visit: Payer: Self-pay

## 2020-08-07 DIAGNOSIS — R262 Difficulty in walking, not elsewhere classified: Secondary | ICD-10-CM

## 2020-08-07 DIAGNOSIS — M6281 Muscle weakness (generalized): Secondary | ICD-10-CM | POA: Diagnosis not present

## 2020-08-07 DIAGNOSIS — M5416 Radiculopathy, lumbar region: Secondary | ICD-10-CM | POA: Diagnosis not present

## 2020-08-07 NOTE — Therapy (Signed)
[place], [place], [address] Phone: [phone]   Fax:  [phone]  Physical Therapy Treatment  Patient Details  Name: [name] MRN: [id number] Date of Birth: 07-01-1981 Referring Provider (PT): Kary Kos   Encounter Date: 08/07/2020   PT End of Session - 08/07/20 1410    Visit Number 7    Number of Visits 12    Date for PT Re-Evaluation 08/15/20    Authorization Type healthy blue    Authorization Time Period 12 visits approved from 10/08-->08/15/20    Authorization - Visit Number 7    Authorization - Number of Visits 12    Progress Note Due on Visit 12    PT Start Time 1402    PT Stop Time 1442    PT Time Calculation (min) 40 min    Activity Tolerance Patient tolerated treatment well;Patient limited by fatigue    Behavior During Therapy [facility] for tasks assessed/performed           Past Medical History:  Diagnosis Date  . Allergy   . Anemia   . Anxiety   . CHF (congestive heart failure) ([place])   . Chronic kidney disease    history of kidney stones  . Coronary artery disease 09/2011   a.s/p BMS to LAD and angioplasty of RI in 09/2011 b. patent stent by cath in 02/2012, 12/2013, and 02/2016 with most recent showing 30-40% dLAD and 30-40% RCA stenosis  . Depression   . Diabetes mellitus   . Hyperlipidemia   . Hypertension   . Midsternal chest pain 03/03/2012  . Migraines   . Morbid obesity ([place])   . Myocardial infarct ([place]) 09/2011  . Myocardial infarction ([place]) 02/2016  . Palpitations 02/17/2012  . Sleep apnea   . Ureteral stone with hydronephrosis   . Urinary tract obstruction due to kidney stone 02/15/2017    Past Surgical History:  Procedure Laterality Date  . CARDIAC CATHETERIZATION  10/08/2011   LAD: 95% mid, Ramus: 95% ostial  . CARPAL TUNNEL RELEASE    . CESAREAN SECTION    . CHOLECYSTECTOMY    . CORONARY ANGIOPLASTY WITH STENT PLACEMENT  10/08/2011   LAD: BMS, Ramus: cutting  balloon angioplasty  . CORONARY STENT INTERVENTION N/A 01/14/2020   Procedure: CORONARY STENT INTERVENTION;  Surgeon: Leonie Man, MD;  Location: [place] [facility];  Service: Cardiovascular;  Laterality: N/A;  . CYSTOSCOPY WITH RETROGRADE PYELOGRAM, URETEROSCOPY AND STENT PLACEMENT Left 02/16/2017   Procedure: CYSTOSCOPY WITH RETROGRADE PYELOGRAM, URETEROSCOPY AND STENT PLACEMENT;  Surgeon: Kathie Rhodes, MD;  Location: [facility];  Service: Urology;  Laterality: Left;  . CYSTOSCOPY WITH RETROGRADE PYELOGRAM, URETEROSCOPY AND STENT PLACEMENT Left 03/28/2017   Procedure: LEFT STENT REMOVAL LEFT RETROGRADE PYELOGRAM LEFT URETEROSCOPY   LASER LITHOTRIPSY  AND  STENT REPLACEMENT;  Surgeon: Cleon Gustin, MD;  [facility];  Service: Urology;  Laterality: Left;  . LEFT HEART CATH AND CORONARY ANGIOGRAPHY N/A 08/15/2018   Procedure: LEFT HEART CATH AND CORONARY ANGIOGRAPHY;  Surgeon: Martinique, Peter M, MD;  Location: [place] [facility];  Service: Cardiovascular;  Laterality: N/A;  . LEFT HEART CATH AND CORONARY ANGIOGRAPHY N/A 01/14/2020   Procedure: LEFT HEART CATH AND CORONARY ANGIOGRAPHY;  Surgeon: Leonie Man, MD;  Location: [place] [facility];  Service: Cardiovascular;  Laterality: N/A;  . LEFT HEART CATHETERIZATION WITH CORONARY ANGIOGRAM N/A 10/08/2011   Procedure: LEFT HEART CATHETERIZATION WITH CORONARY ANGIOGRAM;  Surgeon: Peter M Martinique,  MD;  Location: [place] CATH LAB;  Service: Cardiovascular;  Laterality: N/A;  . LEFT HEART CATHETERIZATION WITH CORONARY ANGIOGRAM N/A 03/02/2012   Procedure: LEFT HEART CATHETERIZATION WITH CORONARY ANGIOGRAM;  Surgeon: Burnell Blanks, MD;  Location: [facility] CATH LAB;  Service: Cardiovascular;  Laterality: N/A;  . LEFT HEART CATHETERIZATION WITH CORONARY ANGIOGRAM N/A 01/04/2014   Procedure: LEFT HEART CATHETERIZATION WITH CORONARY ANGIOGRAM;  Surgeon: Troy Sine, MD;  Location: [facility] CATH LAB;  Service: Cardiovascular;  Laterality: N/A;    There were  no vitals filed for this visit.   Subjective Assessment - 08/07/20 1410    Subjective Patient says the exercises have been going good. No new issues. "Conkle afraid to bend over". Says she was able to walk in [place] for about 20 minutes the other day to test her endurance.    Currently in Pain? Yes    Pain Score 3     Pain Location Back    Pain Orientation Lower    Pain Descriptors / Indicators Throbbing    Pain Type Acute pain    Pain Onset More than a month ago    Pain Frequency Constant                             [facility] Adult PT Treatment/Exercise - 08/07/20 0001      Exercises   Exercises Knee/Hip      Lumbar Exercises: Standing   Heel Raises 20 reps    Row 20 reps;Theraband    Theraband Level (Row) Level 3 (Green)    Shoulder Extension 20 reps    Theraband Level (Shoulder Extension) Level 3 (Green)      Lumbar Exercises: Seated   Sit to Stand 10 reps    Sit to Stand Limitations hands on thighs       Knee/Hip Exercises: Standing   Forward Step Up Both;1 set;Hand Hold: 2;Step Height: 4";15 reps                    PT Short Term Goals - 07/04/20 1510      PT SHORT TERM GOAL #1   Title Pt to be I in HEP to improve pain level to no greater than a 6/10    Time 3    Period Weeks    Status New    Target Date 07/25/20      PT SHORT TERM GOAL #2   Title PT back and hip mobilty to improve to allow pt to be able to put her shoes and socks on with ease.    Time 3    Period Weeks    Status New      PT SHORT TERM GOAL #3   Title PT to core strength to have improved to allow pt to be able to ride in a car for 30 mintues without having increased pain to allow pt to go to appointments without discomfort.             PT Long Term Goals - 07/04/20 1512      PT LONG TERM GOAL #1   Title Pt to be I in an advance HEP to allow pt to be able to decrease her back pain to no greater than a 3/10    Time 6    Period Weeks    Status New    Target  Date 08/15/20      PT LONG TERM GOAL #2  Title PT to improve her hip and lumbar mobility to be able to reach down and pick items off of the ground.    Time 6    Period Weeks    Status New      PT LONG TERM GOAL #3   Title PT  core and LE strength to increase to allow pt to be able to come sit to stand without the use of her UE and without increased pain.    Time 6    Period Weeks    Status New      PT LONG TERM GOAL #4   Title Pt to be able to single leg stance on both LE so  she can walk in her home without feeling as if she needs to furniture walk .    Time 6    Period Weeks    Status New      PT LONG TERM GOAL #5   Title PT to be able to walk for 30 mintues in order to complete grocery shopping without her back pain increasing.    Time 3    Period Weeks    Status New                 Plan - 08/07/20 1441    Clinical Impression Statement Patient tolerated session well today. Progressed standing activity with LE and postural strengthening. Added band rows and extension for postural strengthening. Patient required several breaks for rest due to fatigue but reports no increased complaints of lumbar pain. Activity graded per patient tolerance. Patient educated on proper form and function of all added exercise. Patient with noted weakness/ difficulty with step ups on LLE compared to RT. Patient will continue to benefit from skilled therapy to progress strength and mobility to reduce pain and improve LOF with ADLs.    Personal Factors and Comorbidities Comorbidity 3+    Examination-Activity Limitations Bed Mobility;Caring for Others;Carry;Dressing;Hygiene/Grooming;Locomotion Level;Stairs;Squat    Examination-Participation Restrictions Cleaning;Community Activity;Laundry;Driving;Meal Prep    Stability/Clinical Decision Making Evolving/Moderate complexity    Rehab Potential Good    PT Frequency 2x / week    PT Duration 6 weeks    PT Treatment/Interventions Aquatic Therapy;Gait  training;Stair training;Functional mobility training;Therapeutic activities;Therapeutic exercise;Balance training;Patient/family education;Manual techniques    PT Next Visit Plan Continue to progress core and postural strengthening as tolerated. Continue with standing activity as able.    PT Home Exercise Plan supine: decompression 1-5 as well as ab/glut combo. 10/19 LAQ, scap ret; 07/22/20: GTB decompression 1-4 07/31/20: heel slides, supine clams    Consulted and Agree with Plan of Care Patient           Patient will benefit from skilled therapeutic intervention in order to improve the following deficits and impairments:  Abnormal gait, Decreased activity tolerance, Decreased balance, Decreased mobility, Decreased range of motion, Decreased strength, Difficulty walking, Hypomobility, Increased fascial restricitons, Impaired flexibility, Postural dysfunction, Pain, Obesity  Visit Diagnosis: Difficulty in walking, not elsewhere classified  Muscle weakness (generalized)  Radiculopathy, lumbar region     Problem List Patient Active Problem List   Diagnosis Date Noted  . Burping 06/27/2020  . Nausea without vomiting 06/27/2020  . Diarrhea 06/27/2020  . Trochanteric bursitis, left hip 04/21/2020  . NSTEMI (non-ST elevated myocardial infarction) ([place]) 01/13/2020  . Angina pectoris ([place]) 08/15/2018  . Chronic diastolic CHF (congestive heart failure) ([place]) 08/15/2018  . Essential hypertension 10/28/2017  . History of chronic CHF 10/28/2017  . PCOS (polycystic ovarian syndrome) 10/28/2017  .  Diabetic neuropathy, painful ([place]) 10/28/2017  . Chronic back pain 10/28/2017  . Coronary artery disease   . DM2 (diabetes mellitus, type 2) ([place]) 10/10/2011  . History of acute anterior wall MI 10/09/2011  . Morbid obesity ([place]) 10/09/2011  . Tobacco abuse 10/09/2011  . Hyperlipidemia 10/09/2011    2:43 PM, 08/07/20 Josue Hector PT DPT  Physical Therapist with [facility]  [phone] [place], [place], [address] Phone: [phone]   Fax:  [phone]  Name: [name] MRN: [id number] Date of Birth: Mar 10, 1981

## 2020-08-12 ENCOUNTER — Ambulatory Visit (HOSPITAL_COMMUNITY): Payer: Medicaid Other | Admitting: Physical Therapy

## 2020-08-12 ENCOUNTER — Other Ambulatory Visit: Payer: Self-pay

## 2020-08-12 DIAGNOSIS — M6281 Muscle weakness (generalized): Secondary | ICD-10-CM | POA: Diagnosis not present

## 2020-08-12 DIAGNOSIS — R262 Difficulty in walking, not elsewhere classified: Secondary | ICD-10-CM | POA: Diagnosis not present

## 2020-08-12 DIAGNOSIS — M5416 Radiculopathy, lumbar region: Secondary | ICD-10-CM | POA: Diagnosis not present

## 2020-08-12 DIAGNOSIS — R200 Anesthesia of skin: Secondary | ICD-10-CM | POA: Diagnosis not present

## 2020-08-12 NOTE — Therapy (Signed)
[place], [place], [address] Phone: [phone]   Fax:  [phone]  Physical Therapy Treatment  Patient Details  Name: [name] MRN: [id number] Date of Birth: [birth year] Referring Provider (PT): Kary Kos   Encounter Date: 08/12/2020   PT End of Session - 08/12/20 1548    Visit Number 8    Number of Visits 12    Date for PT Re-Evaluation 08/15/20    Authorization Type healthy blue    Authorization Time Period 12 visits approved from 10/08-->08/15/20    Authorization - Visit Number 8    Authorization - Number of Visits 12    Progress Note Due on Visit 12    PT Start Time 1450    PT Stop Time 1532    PT Time Calculation (min) 42 min    Activity Tolerance Patient tolerated treatment well;Patient limited by fatigue    Behavior During Therapy [facility] for tasks assessed/performed           Past Medical History:  Diagnosis Date  . Allergy   . Anemia   . Anxiety   . CHF (congestive heart failure) ([place])   . Chronic kidney disease    history of kidney stones  . Coronary artery disease 09/2011   a.s/p BMS to LAD and angioplasty of RI in 09/2011 b. patent stent by cath in 02/2012, 12/2013, and 02/2016 with most recent showing 30-40% dLAD and 30-40% RCA stenosis  . Depression   . Diabetes mellitus   . Hyperlipidemia   . Hypertension   . Midsternal chest pain 03/03/2012  . Migraines   . Morbid obesity ([place])   . Myocardial infarct ([place]) 09/2011  . Myocardial infarction ([place]) 02/2016  . Palpitations 02/17/2012  . Sleep apnea   . Ureteral stone with hydronephrosis   . Urinary tract obstruction due to kidney stone 02/15/2017    Past Surgical History:  Procedure Laterality Date  . CARDIAC CATHETERIZATION  10/08/2011   LAD: 95% mid, Ramus: 95% ostial  . CARPAL TUNNEL RELEASE    . CESAREAN SECTION    . CHOLECYSTECTOMY    . CORONARY ANGIOPLASTY WITH STENT PLACEMENT  10/08/2011   LAD: BMS, Ramus: cutting  balloon angioplasty  . CORONARY STENT INTERVENTION N/A 01/14/2020   Procedure: CORONARY STENT INTERVENTION;  Surgeon: Leonie Man, MD;  Location: [place] [facility];  Service: Cardiovascular;  Laterality: N/A;  . CYSTOSCOPY WITH RETROGRADE PYELOGRAM, URETEROSCOPY AND STENT PLACEMENT Left 02/16/2017   Procedure: CYSTOSCOPY WITH RETROGRADE PYELOGRAM, URETEROSCOPY AND STENT PLACEMENT;  Surgeon: Kathie Rhodes, MD;  [facility];  Service: Urology;  Laterality: Left;  . CYSTOSCOPY WITH RETROGRADE PYELOGRAM, URETEROSCOPY AND STENT PLACEMENT Left 03/28/2017   Procedure: LEFT STENT REMOVAL LEFT RETROGRADE PYELOGRAM LEFT URETEROSCOPY   LASER LITHOTRIPSY  AND  STENT REPLACEMENT;  Surgeon: Cleon Gustin, MD;  [facility];  Service: Urology;  Laterality: Left;  . LEFT HEART CATH AND CORONARY ANGIOGRAPHY N/A 08/15/2018   Procedure: LEFT HEART CATH AND CORONARY ANGIOGRAPHY;  Surgeon: Martinique, Peter M, MD;  Location: [place] CV LAB;  Service: Cardiovascular;  Laterality: N/A;  . LEFT HEART CATH AND CORONARY ANGIOGRAPHY N/A 01/14/2020   Procedure: LEFT HEART CATH AND CORONARY ANGIOGRAPHY;  Surgeon: Leonie Man, MD;  Location: [place] CV LAB;  Service: Cardiovascular;  Laterality: N/A;  . LEFT HEART CATHETERIZATION WITH CORONARY ANGIOGRAM N/A 10/08/2011   Procedure: LEFT HEART CATHETERIZATION WITH CORONARY ANGIOGRAM;  Surgeon: Peter M Martinique,  MD;  Location: [place] CATH LAB;  Service: Cardiovascular;  Laterality: N/A;  . LEFT HEART CATHETERIZATION WITH CORONARY ANGIOGRAM N/A 03/02/2012   Procedure: LEFT HEART CATHETERIZATION WITH CORONARY ANGIOGRAM;  Surgeon: Burnell Blanks, MD;  Location: [facility] CATH LAB;  Service: Cardiovascular;  Laterality: N/A;  . LEFT HEART CATHETERIZATION WITH CORONARY ANGIOGRAM N/A 01/04/2014   Procedure: LEFT HEART CATHETERIZATION WITH CORONARY ANGIOGRAM;  Surgeon: Troy Sine, MD;  Location: [facility] CATH LAB;  Service: Cardiovascular;  Laterality: N/A;    There were  no vitals filed for this visit.   Subjective Assessment - 08/12/20 1526    Subjective pain is lower today at 3/10.    Currently in Pain? Yes    Pain Score 3     Pain Location Back    Pain Descriptors / Indicators Sore                             [facility] Adult PT Treatment/Exercise - 08/12/20 0001      Lumbar Exercises: Standing   Row 20 reps;Theraband    Theraband Level (Row) Level 3 (Green)    Shoulder Extension 20 reps    Theraband Level (Shoulder Extension) Level 3 (Green)      Lumbar Exercises: Seated   Sit to Stand 10 reps    Sit to Stand Limitations hands on thighs       Knee/Hip Exercises: Standing   Hip Abduction Both;15 reps    Hip Extension Both;15 reps    Lateral Step Up Both;10 reps;Step Height: 4"    Forward Step Up Both;1 set;Hand Hold: 2;Step Height: 4";15 reps                    PT Short Term Goals - 07/04/20 1510      PT SHORT TERM GOAL #1   Title Pt to be I in HEP to improve pain level to no greater than a 6/10    Time 3    Period Weeks    Status New    Target Date 07/25/20      PT SHORT TERM GOAL #2   Title PT back and hip mobilty to improve to allow pt to be able to put her shoes and socks on with ease.    Time 3    Period Weeks    Status New      PT SHORT TERM GOAL #3   Title PT to core strength to have improved to allow pt to be able to ride in a car for 30 mintues without having increased pain to allow pt to go to appointments without discomfort.             PT Long Term Goals - 07/04/20 1512      PT LONG TERM GOAL #1   Title Pt to be I in an advance HEP to allow pt to be able to decrease her back pain to no greater than a 3/10    Time 6    Period Weeks    Status New    Target Date 08/15/20      PT LONG TERM GOAL #2   Title PT to improve her hip and lumbar mobility to be able to reach down and pick items off of the [place].    Time 6    Period Weeks    Status New      PT LONG TERM GOAL #3   Title PT  core and LE strength to increase to allow pt to be able to come sit to stand without the use of her UE and without increased pain.    Time 6    Period Weeks    Status New      PT LONG TERM GOAL #4   Title Pt to be able to single leg stance on both LE so  she can walk in her home without feeling as if she needs to furniture walk .    Time 6    Period Weeks    Status New      PT LONG TERM GOAL #5   Title PT to be able to walk for 30 mintues in order to complete grocery shopping without her back pain increasing.    Time 3    Period Weeks    Status New                 Plan - 08/12/20 1656    Clinical Impression Statement Continued with established POC working on LE strengthening and postural exercises.    Pt with general postural cues.  Quickly becomes short of breath with activities and needs frequent breaks, however has improved overall.  Increased forward step up to 6" step height today.    Personal Factors and Comorbidities Comorbidity 3+    Examination-Activity Limitations Bed Mobility;Caring for Others;Carry;Dressing;Hygiene/Grooming;Locomotion Level;Stairs;Squat    Examination-Participation Restrictions Cleaning;Community Activity;Laundry;Driving;Meal Prep    Stability/Clinical Decision Making Evolving/Moderate complexity    Rehab Potential Good    PT Frequency 2x / week    PT Duration 6 weeks    PT Treatment/Interventions Aquatic Therapy;Gait training;Stair training;Functional mobility training;Therapeutic activities;Therapeutic exercise;Balance training;Patient/family education;Manual techniques    PT Next Visit Plan Continue to progress core and postural strengthening as tolerated. Continue with standing activity as able.    PT Home Exercise Plan supine: decompression 1-5 as well as ab/glut combo. 10/19 LAQ, scap ret; 07/22/20: GTB decompression 1-4 07/31/20: heel slides, supine clams    Consulted and Agree with Plan of Care Patient           Patient will benefit from  skilled therapeutic intervention in order to improve the following deficits and impairments:  Abnormal gait, Decreased activity tolerance, Decreased balance, Decreased mobility, Decreased range of motion, Decreased strength, Difficulty walking, Hypomobility, Increased fascial restricitons, Impaired flexibility, Postural dysfunction, Pain, Obesity  Visit Diagnosis: Difficulty in walking, not elsewhere classified  Muscle weakness (generalized)  Radiculopathy, lumbar region     Problem List Patient Active Problem List   Diagnosis Date Noted  . Burping 06/27/2020  . Nausea without vomiting 06/27/2020  . Diarrhea 06/27/2020  . Trochanteric bursitis, left hip 04/21/2020  . NSTEMI (non-ST elevated myocardial infarction) ([place]) 01/13/2020  . Angina pectoris ([place]) 08/15/2018  . Chronic diastolic CHF (congestive heart failure) ([place]) 08/15/2018  . Essential hypertension 10/28/2017  . History of chronic CHF 10/28/2017  . PCOS (polycystic ovarian syndrome) 10/28/2017  . Diabetic neuropathy, painful ([place]) 10/28/2017  . Chronic back pain 10/28/2017  . Coronary artery disease   . DM2 (diabetes mellitus, type 2) ([place]) 10/10/2011  . History of acute anterior wall MI 10/09/2011  . Morbid obesity ([place]) 10/09/2011  . Tobacco abuse 10/09/2011  . Hyperlipidemia 10/09/2011   Teena Irani, PTA/CLT [phone]  Teena Irani 08/12/2020, 4:59 PM  [place] [address] [place], [place], [address] Phone: [phone]   Fax:  [phone]  Name: [name] MRN: [id number] Date of  Birth: 05-08-1981

## 2020-08-13 ENCOUNTER — Encounter: Payer: Self-pay | Admitting: Cardiology

## 2020-08-13 ENCOUNTER — Ambulatory Visit: Payer: Medicaid Other | Admitting: Cardiology

## 2020-08-13 VITALS — BP 126/88 | HR 84 | Ht 70.0 in | Wt 393.8 lb

## 2020-08-13 DIAGNOSIS — I5032 Chronic diastolic (congestive) heart failure: Secondary | ICD-10-CM | POA: Diagnosis not present

## 2020-08-13 DIAGNOSIS — E782 Mixed hyperlipidemia: Secondary | ICD-10-CM

## 2020-08-13 DIAGNOSIS — I2510 Atherosclerotic heart disease of native coronary artery without angina pectoris: Secondary | ICD-10-CM

## 2020-08-13 DIAGNOSIS — I10 Essential (primary) hypertension: Secondary | ICD-10-CM

## 2020-08-13 MED ORDER — TORSEMIDE 20 MG PO TABS
40.0000 mg | ORAL_TABLET | Freq: Every day | ORAL | 1 refills | Status: DC | PRN
Start: 2020-08-13 — End: 2021-01-27

## 2020-08-13 MED ORDER — POTASSIUM CHLORIDE CRYS ER 20 MEQ PO TBCR: 20.0000 meq | EXTENDED_RELEASE_TABLET | Freq: Every day | ORAL | 3 refills | Status: DC | PRN
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
## 2020-08-13 NOTE — Patient Instructions (Addendum)

## 2020-08-14 ENCOUNTER — Encounter (HOSPITAL_COMMUNITY): Payer: Self-pay

## 2020-08-14 ENCOUNTER — Other Ambulatory Visit: Payer: Self-pay

## 2020-08-14 ENCOUNTER — Ambulatory Visit (HOSPITAL_COMMUNITY): Payer: Medicaid Other | Admitting: Physical Therapy

## 2020-08-14 ENCOUNTER — Ambulatory Visit (HOSPITAL_COMMUNITY): Payer: Medicaid Other

## 2020-08-14 DIAGNOSIS — M6281 Muscle weakness (generalized): Secondary | ICD-10-CM

## 2020-08-14 DIAGNOSIS — R262 Difficulty in walking, not elsewhere classified: Secondary | ICD-10-CM | POA: Diagnosis not present

## 2020-08-14 DIAGNOSIS — M5416 Radiculopathy, lumbar region: Secondary | ICD-10-CM | POA: Diagnosis not present

## 2020-08-14 NOTE — Therapy (Signed)
[place], [place], [address] Phone: [phone]   Fax:  [phone]  Physical Therapy Treatment  Patient Details  Name: [name] MRN: [id number] Date of Birth: 1981/06/17 Referring Provider (PT): Kary Kos   Encounter Date: 08/14/2020   PT End of Session - 08/14/20 1412    Visit Number 9    Number of Visits 12    Date for PT Re-Evaluation 08/15/20    Authorization Type healthy blue    Authorization Time Period 12 visits approved from 10/08-->08/15/20    Authorization - Visit Number 9    Authorization - Number of Visits 12    Progress Note Due on Visit 12    PT Start Time 1404    PT Stop Time 1444    PT Time Calculation (min) 40 min    Activity Tolerance Patient tolerated treatment well;Patient limited by fatigue    Behavior During Therapy [facility] for tasks assessed/performed           Past Medical History:  Diagnosis Date  . Allergy   . Anemia   . Anxiety   . CHF (congestive heart failure) ([place])   . Chronic kidney disease    history of kidney stones  . Coronary artery disease 09/2011   a.s/p BMS to LAD and angioplasty of RI in 09/2011 b. patent stent by cath in 02/2012, 12/2013, and 02/2016 with most recent showing 30-40% dLAD and 30-40% RCA stenosis  . Depression   . Diabetes mellitus   . Hyperlipidemia   . Hypertension   . Midsternal chest pain 03/03/2012  . Migraines   . Morbid obesity ([place])   . Myocardial infarct ([place]) 09/2011  . Myocardial infarction ([place]) 02/2016  . Palpitations 02/17/2012  . Sleep apnea   . Ureteral stone with hydronephrosis   . Urinary tract obstruction due to kidney stone 02/15/2017    Past Surgical History:  Procedure Laterality Date  . CARDIAC CATHETERIZATION  10/08/2011   LAD: 95% mid, Ramus: 95% ostial  . CARPAL TUNNEL RELEASE    . CESAREAN SECTION    . CHOLECYSTECTOMY    . CORONARY ANGIOPLASTY WITH STENT PLACEMENT  10/08/2011   LAD: BMS, Ramus: cutting  balloon angioplasty  . CORONARY STENT INTERVENTION N/A 01/14/2020   Procedure: CORONARY STENT INTERVENTION;  Surgeon: Leonie Man, MD;  Location: [place] CV LAB;  Service: Cardiovascular;  Laterality: N/A;  . CYSTOSCOPY WITH RETROGRADE PYELOGRAM, URETEROSCOPY AND STENT PLACEMENT Left 02/16/2017   Procedure: CYSTOSCOPY WITH RETROGRADE PYELOGRAM, URETEROSCOPY AND STENT PLACEMENT;  Surgeon: Kathie Rhodes, MD;  [facility];  Service: Urology;  Laterality: Left;  . CYSTOSCOPY WITH RETROGRADE PYELOGRAM, URETEROSCOPY AND STENT PLACEMENT Left 03/28/2017   Procedure: LEFT STENT REMOVAL LEFT RETROGRADE PYELOGRAM LEFT URETEROSCOPY   LASER LITHOTRIPSY  AND  STENT REPLACEMENT;  Surgeon: Cleon Gustin, MD;  [facility];  Service: Urology;  Laterality: Left;  . LEFT HEART CATH AND CORONARY ANGIOGRAPHY N/A 08/15/2018   Procedure: LEFT HEART CATH AND CORONARY ANGIOGRAPHY;  Surgeon: Martinique, Peter M, MD;  Location: [place] CV LAB;  Service: Cardiovascular;  Laterality: N/A;  . LEFT HEART CATH AND CORONARY ANGIOGRAPHY N/A 01/14/2020   Procedure: LEFT HEART CATH AND CORONARY ANGIOGRAPHY;  Surgeon: Leonie Man, MD;  Location: [place] CV LAB;  Service: Cardiovascular;  Laterality: N/A;  . LEFT HEART CATHETERIZATION WITH CORONARY ANGIOGRAM N/A 10/08/2011   Procedure: LEFT HEART CATHETERIZATION WITH CORONARY ANGIOGRAM;  Surgeon: Peter M Martinique,  MD;  Location: [place] CATH LAB;  Service: Cardiovascular;  Laterality: N/A;  . LEFT HEART CATHETERIZATION WITH CORONARY ANGIOGRAM N/A 03/02/2012   Procedure: LEFT HEART CATHETERIZATION WITH CORONARY ANGIOGRAM;  Surgeon: Burnell Blanks, MD;  Location: [facility] CATH LAB;  Service: Cardiovascular;  Laterality: N/A;  . LEFT HEART CATHETERIZATION WITH CORONARY ANGIOGRAM N/A 01/04/2014   Procedure: LEFT HEART CATHETERIZATION WITH CORONARY ANGIOGRAM;  Surgeon: Troy Sine, MD;  Location: [facility] CATH LAB;  Service: Cardiovascular;  Laterality: N/A;    There were  no vitals filed for this visit.   Subjective Assessment - 08/14/20 1412    Subjective Pt reports back is feeling good today.    Pertinent History DM, HTN, CHF,    Patient Stated Goals less pain , to be able walk better, get in and out of car easier    Currently in Pain? No/denies                             [facility] Adult PT Treatment/Exercise - 08/14/20 0001      Lumbar Exercises: Standing   Heel Raises 10 reps    Heel Raises Limitations wall arch with heel raises    Other Standing Lumbar Exercises Palloff partial tandem stance 10x 4 sets      Lumbar Exercises: Seated   Sit to Stand 10 reps    Sit to Stand Limitations arms crossed on chest      Knee/Hip Exercises: Standing   Lateral Step Up Both;10 reps;Step Height: 4"    Forward Step Up Both;10 reps;Hand Hold: 1;Step Height: 6"                    PT Short Term Goals - 07/04/20 1510      PT SHORT TERM GOAL #1   Title Pt to be I in HEP to improve pain level to no greater than a 6/10    Time 3    Period Weeks    Status New    Target Date 07/25/20      PT SHORT TERM GOAL #2   Title PT back and hip mobilty to improve to allow pt to be able to put her shoes and socks on with ease.    Time 3    Period Weeks    Status New      PT SHORT TERM GOAL #3   Title PT to core strength to have improved to allow pt to be able to ride in a car for 30 mintues without having increased pain to allow pt to go to appointments without discomfort.             PT Long Term Goals - 07/04/20 1512      PT LONG TERM GOAL #1   Title Pt to be I in an advance HEP to allow pt to be able to decrease her back pain to no greater than a 3/10    Time 6    Period Weeks    Status New    Target Date 08/15/20      PT LONG TERM GOAL #2   Title PT to improve her hip and lumbar mobility to be able to reach down and pick items off of the ground.    Time 6    Period Weeks    Status New      PT LONG TERM GOAL #3   Title PT   core and LE strength to  increase to allow pt to be able to come sit to stand without the use of her UE and without increased pain.    Time 6    Period Weeks    Status New      PT LONG TERM GOAL #4   Title Pt to be able to single leg stance on both LE so  she can walk in her home without feeling as if she needs to furniture walk .    Time 6    Period Weeks    Status New      PT LONG TERM GOAL #5   Title PT to be able to walk for 30 mintues in order to complete grocery shopping without her back pain increasing.    Time 3    Period Weeks    Status New                 Plan - 08/14/20 1430    Clinical Impression Statement Continued wiht established POC focusing on LE strengthening and postureal exercises.  Added wall arch and paloff for core strengthening.  Cueing for core engangement to reduce increased lordacic lumbar curvature during standing exercises.  Pt able to complete STS without hands on thighs, cueing for gluteal rather than quad activation for proximal strengthening.  Pt tolerated well to session with no reports of increased pain.  Pt limited by SOB and fatigue especially following step up training.    Personal Factors and Comorbidities Comorbidity 3+    Examination-Activity Limitations Bed Mobility;Caring for Others;Carry;Dressing;Hygiene/Grooming;Locomotion Level;Stairs;Squat    Examination-Participation Restrictions Cleaning;Community Activity;Laundry;Driving;Meal Prep    Stability/Clinical Decision Making Evolving/Moderate complexity    Clinical Decision Making Moderate    Rehab Potential Good    PT Frequency 2x / week    PT Duration 6 weeks    PT Treatment/Interventions Aquatic Therapy;Gait training;Stair training;Functional mobility training;Therapeutic activities;Therapeutic exercise;Balance training;Patient/family education;Manual techniques    PT Next Visit Plan Review goals next session per end of cert.  Continue to progress core and postural strenghtening as  tolerated.  Continue standint activities as able.    PT Home Exercise Plan supine: decompression 1-5 as well as ab/glut combo. 10/19 LAQ, scap ret; 07/22/20: GTB decompression 1-4 07/31/20: heel slides, supine clams           Patient will benefit from skilled therapeutic intervention in order to improve the following deficits and impairments:  Abnormal gait, Decreased activity tolerance, Decreased balance, Decreased mobility, Decreased range of motion, Decreased strength, Difficulty walking, Hypomobility, Increased fascial restricitons, Impaired flexibility, Postural dysfunction, Pain, Obesity  Visit Diagnosis: Radiculopathy, lumbar region  Muscle weakness (generalized)  Difficulty in walking, not elsewhere classified     Problem List Patient Active Problem List   Diagnosis Date Noted  . Burping 06/27/2020  . Nausea without vomiting 06/27/2020  . Diarrhea 06/27/2020  . Trochanteric bursitis, left hip 04/21/2020  . NSTEMI (non-ST elevated myocardial infarction) ([place]) 01/13/2020  . Angina pectoris ([place]) 08/15/2018  . Chronic diastolic CHF (congestive heart failure) ([place]) 08/15/2018  . Essential hypertension 10/28/2017  . History of chronic CHF 10/28/2017  . PCOS (polycystic ovarian syndrome) 10/28/2017  . Diabetic neuropathy, painful ([place]) 10/28/2017  . Chronic back pain 10/28/2017  . Coronary artery disease   . DM2 (diabetes mellitus, type 2) ([place]) 10/10/2011  . History of acute anterior wall MI 10/09/2011  . Morbid obesity ([place]) 10/09/2011  . Tobacco abuse 10/09/2011  . Hyperlipidemia 10/09/2011   Ihor Austin, LPTA/CLT; CBIS [phone]  Aldona Lento 08/14/2020,  2:47 PM  [place], [place], [address] Phone: [phone]   Fax:  [phone]  Name: [name] MRN: [id number] Date of Birth: [birth year]

## 2020-08-19 ENCOUNTER — Ambulatory Visit: Payer: Medicaid Other | Admitting: Family Medicine

## 2020-08-19 ENCOUNTER — Ambulatory Visit (HOSPITAL_COMMUNITY): Payer: Medicaid Other | Admitting: Physical Therapy

## 2020-08-19 ENCOUNTER — Encounter: Payer: Self-pay | Admitting: Family Medicine

## 2020-08-19 ENCOUNTER — Encounter (HOSPITAL_COMMUNITY): Payer: Self-pay | Admitting: Physical Therapy

## 2020-08-19 ENCOUNTER — Other Ambulatory Visit: Payer: Self-pay

## 2020-08-19 VITALS — BP 120/83 | HR 90 | Temp 97.4°F | Ht 70.0 in | Wt 388.8 lb

## 2020-08-19 DIAGNOSIS — I2510 Atherosclerotic heart disease of native coronary artery without angina pectoris: Secondary | ICD-10-CM

## 2020-08-19 DIAGNOSIS — E1140 Type 2 diabetes mellitus with diabetic neuropathy, unspecified: Secondary | ICD-10-CM

## 2020-08-19 DIAGNOSIS — M5441 Lumbago with sciatica, right side: Secondary | ICD-10-CM

## 2020-08-19 DIAGNOSIS — E785 Hyperlipidemia, unspecified: Secondary | ICD-10-CM

## 2020-08-19 DIAGNOSIS — E1159 Type 2 diabetes mellitus with other circulatory complications: Secondary | ICD-10-CM

## 2020-08-19 DIAGNOSIS — N182 Chronic kidney disease, stage 2 (mild): Secondary | ICD-10-CM

## 2020-08-19 DIAGNOSIS — Z720 Tobacco use: Secondary | ICD-10-CM

## 2020-08-19 DIAGNOSIS — M5442 Lumbago with sciatica, left side: Secondary | ICD-10-CM

## 2020-08-19 DIAGNOSIS — E1122 Type 2 diabetes mellitus with diabetic chronic kidney disease: Secondary | ICD-10-CM | POA: Diagnosis not present

## 2020-08-19 DIAGNOSIS — M6281 Muscle weakness (generalized): Secondary | ICD-10-CM

## 2020-08-19 DIAGNOSIS — I152 Hypertension secondary to endocrine disorders: Secondary | ICD-10-CM

## 2020-08-19 DIAGNOSIS — Z23 Encounter for immunization: Secondary | ICD-10-CM | POA: Diagnosis not present

## 2020-08-19 DIAGNOSIS — G8929 Other chronic pain: Secondary | ICD-10-CM

## 2020-08-19 DIAGNOSIS — E1169 Type 2 diabetes mellitus with other specified complication: Secondary | ICD-10-CM

## 2020-08-19 DIAGNOSIS — R262 Difficulty in walking, not elsewhere classified: Secondary | ICD-10-CM

## 2020-08-19 DIAGNOSIS — F411 Generalized anxiety disorder: Secondary | ICD-10-CM

## 2020-08-19 DIAGNOSIS — M5416 Radiculopathy, lumbar region: Secondary | ICD-10-CM

## 2020-08-19 LAB — BAYER DCA HB A1C WAIVED: HB A1C (BAYER DCA - WAIVED): 6.4 % (ref <7.0)

## 2020-08-19 MED ORDER — TRAMADOL HCL 50 MG PO TABS: 50.0000 mg | ORAL_TABLET | Freq: Two times a day (BID) | ORAL | 1 refills | Status: DC | PRN

## 2020-08-19 MED ORDER — FLUOXETINE HCL 20 MG PO CAPS: 60.0000 mg | ORAL_CAPSULE | Freq: Every day | ORAL | 0 refills | Status: DC

## 2020-08-19 MED ORDER — PREGABALIN 100 MG PO CAPS: 100.0000 mg | ORAL_CAPSULE | Freq: Two times a day (BID) | ORAL | 5 refills | Status: DC

## 2020-08-19 NOTE — Therapy (Signed)
[place], [place], [address] Phone: [phone]   Fax:  [phone]  Physical Therapy Treatment/Discharge Summary  Patient Details  Name: [name] MRN: [id number] Date of Birth: 05/06/1981 Referring Provider (PT): Kary Kos   Encounter Date: 08/19/2020  PHYSICAL THERAPY DISCHARGE SUMMARY  Visits from Start of Care: 10  Current functional level related to goals / functional outcomes: See below   Remaining deficits: See below   Education / Equipment: See below  Plan: Patient agrees to discharge.  Patient goals were partially met. Patient is being discharged due to meeting the stated rehab goals.  ?????        PT End of Session - 08/19/20 1412    Visit Number 10    Number of Visits 12    Date for PT Re-Evaluation 08/15/20    Authorization Type healthy blue    Authorization Time Period 12 visits approved from 10/08-->08/15/20    Authorization - Visit Number 10    Authorization - Number of Visits 12    Progress Note Due on Visit 12    PT Start Time 3295   arrives late   PT Stop Time 1442    PT Time Calculation (min) 30 min    Activity Tolerance Patient tolerated treatment well;Patient limited by fatigue    Behavior During Therapy [facility] for tasks assessed/performed           Past Medical History:  Diagnosis Date  . Allergy   . Anemia   . Anxiety   . CHF (congestive heart failure) ([place])   . Chronic kidney disease    history of kidney stones  . Coronary artery disease 09/2011   a.s/p BMS to LAD and angioplasty of RI in 09/2011 b. patent stent by cath in 02/2012, 12/2013, and 02/2016 with most recent showing 30-40% dLAD and 30-40% RCA stenosis  . Depression   . Diabetes mellitus   . Hyperlipidemia   . Hypertension   . Midsternal chest pain 03/03/2012  . Migraines   . Morbid obesity ([place])   . Myocardial infarct ([place]) 09/2011  . Myocardial infarction ([place]) 02/2016  . Palpitations  02/17/2012  . Sleep apnea   . Ureteral stone with hydronephrosis   . Urinary tract obstruction due to kidney stone 02/15/2017    Past Surgical History:  Procedure Laterality Date  . CARDIAC CATHETERIZATION  10/08/2011   LAD: 95% mid, Ramus: 95% ostial  . CARPAL TUNNEL RELEASE    . CESAREAN SECTION    . CHOLECYSTECTOMY    . CORONARY ANGIOPLASTY WITH STENT PLACEMENT  10/08/2011   LAD: BMS, Ramus: cutting balloon angioplasty  . CORONARY STENT INTERVENTION N/A 01/14/2020   Procedure: CORONARY STENT INTERVENTION;  Surgeon: Leonie Man, MD;  Location: [place] CV LAB;  Service: Cardiovascular;  Laterality: N/A;  . CYSTOSCOPY WITH RETROGRADE PYELOGRAM, URETEROSCOPY AND STENT PLACEMENT Left 02/16/2017   Procedure: CYSTOSCOPY WITH RETROGRADE PYELOGRAM, URETEROSCOPY AND STENT PLACEMENT;  Surgeon: Kathie Rhodes, MD;  Location: [facility];  Service: Urology;  Laterality: Left;  . CYSTOSCOPY WITH RETROGRADE PYELOGRAM, URETEROSCOPY AND STENT PLACEMENT Left 03/28/2017   Procedure: LEFT STENT REMOVAL LEFT RETROGRADE PYELOGRAM LEFT URETEROSCOPY   LASER LITHOTRIPSY  AND  STENT REPLACEMENT;  Surgeon: Cleon Gustin, MD;  Location: [facility];  Service: Urology;  Laterality: Left;  . LEFT HEART CATH AND CORONARY ANGIOGRAPHY N/A 08/15/2018   Procedure: LEFT HEART CATH AND CORONARY ANGIOGRAPHY;  Surgeon: Martinique, Peter M, MD;  Location:  [place] INVASIVE CV LAB;  Service: Cardiovascular;  Laterality: N/A;  . LEFT HEART CATH AND CORONARY ANGIOGRAPHY N/A 01/14/2020   Procedure: LEFT HEART CATH AND CORONARY ANGIOGRAPHY;  Surgeon: Leonie Man, MD;  Location: [place] CV LAB;  Service: Cardiovascular;  Laterality: N/A;  . LEFT HEART CATHETERIZATION WITH CORONARY ANGIOGRAM N/A 10/08/2011   Procedure: LEFT HEART CATHETERIZATION WITH CORONARY ANGIOGRAM;  Surgeon: Peter M Martinique, MD;  Location: [facility] CATH LAB;  Service: Cardiovascular;  Laterality: N/A;  . LEFT HEART CATHETERIZATION WITH CORONARY ANGIOGRAM N/A 03/02/2012    Procedure: LEFT HEART CATHETERIZATION WITH CORONARY ANGIOGRAM;  Surgeon: Burnell Blanks, MD;  Location: [facility] CATH LAB;  Service: Cardiovascular;  Laterality: N/A;  . LEFT HEART CATHETERIZATION WITH CORONARY ANGIOGRAM N/A 01/04/2014   Procedure: LEFT HEART CATHETERIZATION WITH CORONARY ANGIOGRAM;  Surgeon: Troy Sine, MD;  Location: [facility] CATH LAB;  Service: Cardiovascular;  Laterality: N/A;    There were no vitals filed for this visit.   Subjective Assessment - 08/19/20 1413    Subjective Patient 80-85% improvement since starting therapy. Her home exercises are going alright. She feels like her balance is better but not perfect yet.    Pertinent History DM, HTN, CHF,    Patient Stated Goals less pain , to be able walk better, get in and out of car easier    Currently in Pain? Yes    Pain Score 3     Pain Location Back    Pain Orientation Lower    Pain Descriptors / Indicators Sore              [facility] PT Assessment - 08/19/20 0001      Assessment   Medical Diagnosis low back pain     Referring Provider (PT) Kary Kos    Onset Date/Surgical Date 05/17/20    Next MD Visit 08/02/2020    Prior Therapy none      Precautions   Precautions Fall      Restrictions   Weight Bearing Restrictions No      Balance Screen   Has the patient fallen in the past 6 months Yes    How many times? 1    Has the patient had a decrease in activity level because of a fear of falling?  Yes    Is the patient reluctant to leave their home because of a fear of falling?  No      Prior Function   Level of Independence Independent    Vocation Unemployed      Cognition   Overall Cognitive Status Within Functional Limits for tasks assessed      AROM   Lumbar Flexion fingers to knees, pinching in lower thoracic spine    Lumbar Extension 50% limited    Lumbar - Right Side Bend 25% limited    Lumbar - Left Side Bend 25% limited    Lumbar - Right Rotation 25% limited    Lumbar - Left Rotation 25%  limited                                 PT Education - 08/19/20 1412    Education Details Patient educated on HEP, exercise mechanics, reassessment findings, returning to PT if needed    Person(s) Educated Patient    Methods Explanation;Demonstration    Comprehension Verbalized understanding;Returned demonstration            PT Short Term Goals -  08/19/20 1420      PT SHORT TERM GOAL #1   Title Pt to be I in HEP to improve pain level to no greater than a 6/10    Time 3    Period Weeks    Status Achieved    Target Date 07/25/20      PT SHORT TERM GOAL #2   Title PT back and hip mobilty to improve to allow pt to be able to put her shoes and socks on with ease.    Time 3    Period Weeks    Status Achieved      PT SHORT TERM GOAL #3   Title PT to core strength to have improved to allow pt to be able to ride in a car for 30 mintues without having increased pain to allow pt to go to appointments without discomfort.    Status On-going             PT Long Term Goals - 08/19/20 1423      PT LONG TERM GOAL #1   Title Pt to be I in an advance HEP to allow pt to be able to decrease her back pain to no greater than a 3/10    Time 6    Period Weeks    Status Achieved      PT LONG TERM GOAL #2   Title PT to improve her hip and lumbar mobility to be able to reach down and pick items off of the ground.    Time 6    Period Weeks    Status Partially Met      PT LONG TERM GOAL #3   Title PT  core and LE strength to increase to allow pt to be able to come sit to stand without the use of her UE and without increased pain.    Time 6    Period Weeks    Status Achieved      PT LONG TERM GOAL #4   Title Pt to be able to single leg stance on both LE so  she can walk in her home without feeling as if she needs to furniture walk .    Baseline 7 seconds LLE, 14 seconds RLE    Time 6    Period Weeks    Status Achieved      PT LONG TERM GOAL #5   Title PT to  be able to walk for 30 mintues in order to complete grocery shopping without her back pain increasing.    Time 3    Period Weeks    Status Not Met                 Plan - 08/19/20 1413    Clinical Impression Statement Patient has met 2/3 short term goals and 4/5 long term goals at this time with ability to complete HEP, improved ROM, ability to put on shoes, and improved balance. Patient remains limited by back pain, fatigue and impaired activity tolerance. Patient educated on gradual increase in activity and reassessment findings. Patient discharged from physical therapy at this time.    Personal Factors and Comorbidities Comorbidity 3+    Examination-Activity Limitations Bed Mobility;Caring for Others;Carry;Dressing;Hygiene/Grooming;Locomotion Level;Stairs;Squat    Examination-Participation Restrictions Cleaning;Community Activity;Laundry;Driving;Meal Prep    Stability/Clinical Decision Making Evolving/Moderate complexity    Rehab Potential Good    PT Frequency 2x / week    PT Duration 6 weeks    PT Treatment/Interventions Aquatic Therapy;Gait training;Stair training;Functional  mobility training;Therapeutic activities;Therapeutic exercise;Balance training;Patient/family education;Manual techniques    PT Next Visit Plan Continue to progress core and postural strenghtening as tolerated.  Continue standint activities as able.    PT Home Exercise Plan supine: decompression 1-5 as well as ab/glut combo. 10/19 LAQ, scap ret; 07/22/20: GTB decompression 1-4 07/31/20: heel slides, supine clams           Patient will benefit from skilled therapeutic intervention in order to improve the following deficits and impairments:  Abnormal gait, Decreased activity tolerance, Decreased balance, Decreased mobility, Decreased range of motion, Decreased strength, Difficulty walking, Hypomobility, Increased fascial restricitons, Impaired flexibility, Postural dysfunction, Pain, Obesity  Visit  Diagnosis: Radiculopathy, lumbar region  Muscle weakness (generalized)  Difficulty in walking, not elsewhere classified     Problem List Patient Active Problem List   Diagnosis Date Noted  . Burping 06/27/2020  . Nausea without vomiting 06/27/2020  . Diarrhea 06/27/2020  . Trochanteric bursitis, left hip 04/21/2020  . NSTEMI (non-ST elevated myocardial infarction) ([place]) 01/13/2020  . Angina pectoris ([place]) 08/15/2018  . Chronic diastolic CHF (congestive heart failure) ([place]) 08/15/2018  . Essential hypertension 10/28/2017  . History of chronic CHF 10/28/2017  . PCOS (polycystic ovarian syndrome) 10/28/2017  . Diabetic neuropathy, painful ([place]) 10/28/2017  . Chronic back pain 10/28/2017  . Coronary artery disease   . DM2 (diabetes mellitus, type 2) ([place]) 10/10/2011  . History of acute anterior wall MI 10/09/2011  . Morbid obesity ([place]) 10/09/2011  . Tobacco abuse 10/09/2011  . Hyperlipidemia 10/09/2011    2:52 PM, 08/19/20 Mearl Latin PT, DPT Physical Therapist at [place], [place], [address] Phone: [phone]   Fax:  [phone]  Name: [name] MRN: [id number] Date of Birth: 08-07-1981

## 2020-08-19 NOTE — Patient Instructions (Addendum)
Sugar up some but [place] controlled.  Get off cigarettes. Prozac dose increased. Caution with the Tramadol.  You are at increased risk of serotonin syndrome with this combination. Behavioral health will call you.   Serotonin Syndrome Serotonin is a chemical in your body (neurotransmitter) that helps to control several functions, such as:  Brain and nerve cell function.  Mood and emotions.  Memory.  Eating.  Sleeping.  Sexual activity.  Stress response. Having too much serotonin in your body can cause serotonin syndrome. This condition can be harmful to your brain and nerve cells. This can be a life-threatening condition. What are the causes? This condition may be caused by taking medicines or drugs that increase the level of serotonin in your body, such as:  Antidepressant medicines.  Migraine medicines.  Certain pain medicines.  Certain drugs, including ecstasy, LSD, cocaine, and amphetamines.  Over-the-counter cough or cold medicines that contain dextromethorphan.  Certain herbal supplements, including St. John's wort, ginseng, and nutmeg. This condition usually occurs when you take these medicines or drugs in combination, but it can also happen with a high dose of a single medicine or drug. What increases the risk? You are more likely to develop this condition if:  You just started taking a medicine or drug that increases the level of serotonin in the body.  You recently increased the dose of a medicine or drug that increases the level of serotonin in the body.  You take more than one medicine or drug that increases the level of serotonin in the body. What are the signs or symptoms? Symptoms of this condition usually start within several hours of taking a medicine or drug. Symptoms may be mild or severe. Mild symptoms include:  Sweating.  Restlessness or agitation.  Muscle twitching or stiffness.  Rapid heart rate.  Nausea and  vomiting.  Diarrhea.  Headache.  Shivering or goose bumps.  Confusion. Severe symptoms include:  Irregular heartbeat.  Seizures.  Loss of consciousness.  High fever. How is this diagnosed? This condition may be diagnosed based on:  Your medical history.  A physical exam.  Your prior use of drugs and medicines.  Blood or urine tests. These may be used to rule out other causes of your symptoms. How is this treated? The treatment for this condition depends on the severity of your symptoms.  For mild cases, stopping the medicine or drug that caused your condition is usually all that is needed.  For moderate to severe cases, treatment in a hospital may be needed to prevent or manage life-threatening symptoms. This may include medicines to control your symptoms, IV fluids, interventions to support your breathing, and treatments to control your body temperature. Follow these instructions at home: Medicines   Take over-the-counter and prescription medicines only as told by your health care provider. This is important.  Check with your health care provider before you start taking any new prescriptions, over-the-counter medicines, herbs, or supplements.  Avoid combining any medicines that can cause this condition to occur. Lifestyle   Maintain a healthy lifestyle. ? Eat a healthy diet that includes plenty of vegetables, fruits, whole grains, low-fat dairy products, and lean protein. Do not eat a lot of foods that are high in fat, added sugars, or salt. ? Get the right amount and quality of sleep. Most adults need 7-9 hours of sleep each night. ? Make time to exercise, even if it is only for short periods of time. Most adults should exercise for at least 150 minutes  each week. ? Do not drink alcohol. ? Do not use illegal drugs, and do not take medicines for reasons other than they are prescribed. General instructions  Do not use any products that contain nicotine or  tobacco, such as cigarettes and e-cigarettes. If you need help quitting, ask your health care provider.  Keep all follow-up visits as told by your health care provider. This is important. Contact a health care provider if:  Your symptoms do not improve or they get worse. Get help right away if you:  Have worsening confusion, severe headache, chest pain, high fever, seizures, or loss of consciousness.  Experience serious side effects of medicine, such as swelling of your face, lips, tongue, or throat.  Have serious thoughts about hurting yourself or others. These symptoms may represent a serious problem that is an emergency. Do not wait to see if the symptoms will go away. Get medical help right away. Call your local emergency services (911 in the [place] Do not drive yourself to the hospital. If you ever feel like you may hurt yourself or others, or have thoughts about taking your own life, get help right away. You can go to your nearest emergency department or call:  Your local emergency services (911 in the [place]  A suicide crisis helpline, such as the National Suicide Prevention Lifeline at 332-443-1758. This is open 24 hours a day. Summary  Serotonin is a brain chemical that helps to regulate the nervous system. High levels of serotonin in the body can cause serotonin syndrome, which is a very dangerous condition.  This condition may be caused by taking medicines or drugs that increase the level of serotonin in your body.  Treatment depends on the severity of your symptoms. For mild cases, stopping the medicine or drug that caused your condition is usually all that is needed.  Check with your health care provider before you start taking any new prescriptions, over-the-counter medicines, herbs, or supplements. This information is not intended to replace advice given to you by your health care provider. Make sure you discuss any questions you have with your health care  provider. Document Revised: 10/21/2017 Document Reviewed: 10/21/2017 Elsevier Patient Education  2020 [employer].
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
## 2020-08-20 ENCOUNTER — Other Ambulatory Visit: Payer: Self-pay | Admitting: Family Medicine

## 2020-08-20 DIAGNOSIS — R142 Eructation: Secondary | ICD-10-CM

## 2020-08-20 DIAGNOSIS — R110 Nausea: Secondary | ICD-10-CM

## 2020-08-20 DIAGNOSIS — E1122 Type 2 diabetes mellitus with diabetic chronic kidney disease: Secondary | ICD-10-CM

## 2020-08-22 ENCOUNTER — Telehealth: Payer: Self-pay | Admitting: Clinical

## 2020-08-22 ENCOUNTER — Telehealth: Payer: Self-pay | Admitting: Licensed Clinical Social Worker

## 2020-08-22 NOTE — Telephone Encounter (Signed)
Unsuccessful attempt to make contact.  Writer will call at a later time.
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
## 2020-08-25 ENCOUNTER — Telehealth: Payer: Self-pay | Admitting: *Deleted

## 2020-08-25 NOTE — Telephone Encounter (Signed)
Called pt to offer sooner appt for procedure but she declined at this time 

## 2020-08-29 ENCOUNTER — Telehealth: Payer: Self-pay | Admitting: Licensed Clinical Social Worker

## 2020-08-29 NOTE — Telephone Encounter (Signed)
Writer spoke with husband.  He stated that Patient was unavailable (asleep) at this time.  He requested a call back later today as Patient would like an afternoon appointment to begin service.

## 2020-09-01 ENCOUNTER — Telehealth: Payer: Self-pay | Admitting: Licensed Clinical Social Worker

## 2020-09-01 NOTE — Telephone Encounter (Signed)
Left message encouraging contact 

## 2020-09-06 ENCOUNTER — Other Ambulatory Visit: Payer: Self-pay | Admitting: Family Medicine

## 2020-09-06 DIAGNOSIS — M5441 Lumbago with sciatica, right side: Secondary | ICD-10-CM

## 2020-09-21 ENCOUNTER — Other Ambulatory Visit: Payer: Self-pay | Admitting: Cardiology

## 2020-09-21 ENCOUNTER — Other Ambulatory Visit: Payer: Self-pay | Admitting: Family Medicine

## 2020-10-01 ENCOUNTER — Ambulatory Visit (INDEPENDENT_AMBULATORY_CARE_PROVIDER_SITE_OTHER): Payer: Medicaid Other | Admitting: Family Medicine

## 2020-10-01 ENCOUNTER — Other Ambulatory Visit: Payer: Self-pay | Admitting: Cardiology

## 2020-10-01 DIAGNOSIS — F411 Generalized anxiety disorder: Secondary | ICD-10-CM | POA: Diagnosis not present

## 2020-10-01 DIAGNOSIS — F418 Other specified anxiety disorders: Secondary | ICD-10-CM

## 2020-10-01 MED ORDER — BUSPIRONE HCL 5 MG PO TABS: ORAL_TABLET | ORAL | 0 refills | Status: DC

## 2020-10-01 MED ORDER — FLUOXETINE HCL 20 MG PO CAPS: 40.0000 mg | ORAL_CAPSULE | Freq: Every day | ORAL | 0 refills | Status: DC
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
## 2020-10-15 ENCOUNTER — Telehealth: Payer: Self-pay | Admitting: Licensed Clinical Social Worker

## 2020-10-15 DIAGNOSIS — F411 Generalized anxiety disorder: Secondary | ICD-10-CM

## 2020-10-15 NOTE — Telephone Encounter (Signed)
Left generic message encouraging contact 
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
## 2020-10-15 NOTE — BH Specialist Note (Signed)
Virtual [facility]al [facility] Treatment Plan Team Note  MRN: [id number] NAME: [name]  DATE: 10/01/20  Start time:  310p End time:  315p Total time:  5 min  Total number of Virtual [facility] Treatment Team Plan encounters: 1/4  Treatment Team Attendees: Royal Piedra, LCSW; Dr Modesta Messing, Psychiatrist  Diagnoses:    ICD-10-CM   1. Generalized anxiety disorder  F41.1     Goals, Interventions and Follow-up Plan Goals: Increase healthy adjustment to current life circumstances Interventions: Motivational Interviewing Solution-Focused Strategies Medication Management Recommendations: continue medications  Follow-up Plan: Continue VBH services  History of the present illness Presenting Problem/Current Symptoms: increase appetite & sleep, anxiousness  Psychiatric History  Depression: No Anxiety: Yes Mania: No Psychosis: No PTSD symptoms: No  Past Psychiatric History/Hospitalization(s): Hospitalization for psychiatric illness: No Prior Suicide Attempts: No Prior Self-injurious behavior: No  Psychosocial stressors: medical concerns; crowd of people   Self-harm Behaviors Risk Assessment   Screenings PHQ-9 Assessments:  Depression screen [facility] 2/9 10/01/2020 08/19/2020 04/08/2020  Decreased Interest 0 0 0  Down, Depressed, Hopeless 1 0 0  PHQ - 2 Score 1 0 0  Altered sleeping - - 0  Tired, decreased energy - - 0  Change in appetite - - 0  Feeling bad or failure about yourself  - - 0  Trouble concentrating - - 0  Moving slowly or fidgety/restless - - 0  Suicidal thoughts - - 0  PHQ-9 Score - - 0  Difficult doing work/chores - - -  Some recent data might be hidden   GAD-7 Assessments:  GAD 7 : Generalized Anxiety Score 10/01/2020 08/19/2020 10/09/2019 06/05/2019  Nervous, Anxious, on Edge 3 1 2 2   Control/stop worrying 2 1 1 2   Worry too much - different things 3 3 1 2   Trouble relaxing 3 3 1 3   Restless 2 2 0 0  Easily annoyed or irritable 1 2 1 3   Afraid - awful might  happen 0 0 1 0  Total GAD 7 Score 14 12 7 12   Anxiety Difficulty Very difficult Somewhat difficult Somewhat difficult Somewhat difficult    Past Medical History Past Medical History:  Diagnosis Date  . Allergy   . Anemia   . Anxiety   . CHF (congestive heart failure) ([place])   . Chronic kidney disease    history of kidney stones  . Coronary artery disease 09/2011   a.s/p BMS to LAD and angioplasty of RI in 09/2011 b. patent stent by cath in 02/2012, 12/2013, and 02/2016 with most recent showing 30-40% dLAD and 30-40% RCA stenosis  . Depression   . Diabetes mellitus   . Hyperlipidemia   . Hypertension   . Midsternal chest pain 03/03/2012  . Migraines   . Morbid obesity ([place])   . Myocardial infarct ([place]) 09/2011  . Myocardial infarction ([place]) 02/2016  . Palpitations 02/17/2012  . Sleep apnea   . Ureteral stone with hydronephrosis   . Urinary tract obstruction due to kidney stone 02/15/2017    Vital signs: There were no vitals filed for this visit.  Allergies:  Allergies as of 10/15/2020 - Review Complete 10/01/2020  Allergen Reaction Noted  . Bee venom Anaphylaxis and Swelling 06/23/2012  . Latex Itching 08/14/2018  . Sulfa drugs cross reactors Nausea And Vomiting 10/08/2011    Medication History Current medications:  Outpatient Encounter Medications as of 10/15/2020  Medication Sig  . ACCU-CHEK AVIVA PLUS test strip USE TO TEST BLOOD SUGAR FOUR TIMES DAILY  . Accu-Chek Softclix Lancets  lancets Check BS four times daily Dx E11.9  . aspirin 81 MG chewable tablet Chew 81 mg by mouth daily.  . blood glucose meter kit and supplies Dispense based on patient and insurance preference. Use up to four times daily as directed. (FOR ICD-10:  E11.22). Check BG daily  . busPIRone (BUSPAR) 5 MG tablet Take 1 tablet (5 mg total) by mouth 2 (two) times daily for 7 days, THEN 1.5 tablets (7.5 mg total) 2 (two) times daily for 7 days, THEN 2 tablets (10 mg total) 2 (two) times daily for 16  days.  . carvedilol (COREG) 25 MG tablet Take 1 tablet (25 mg total) by mouth 2 (two) times daily with a meal. TAKE 1 TABLET BY MOUTH TWICE DAILY WITH A MEAL  . clopidogrel (PLAVIX) 75 MG tablet take 300 mg (4 tablets) on Day 1, then take 75 mg (1 tablet) daily.  . colestipol (COLESTID) 1 g tablet Take 2 tablets (2 g total) by mouth daily. Do not take within 4 hours of other medications.  . fenofibrate (TRICOR) 48 MG tablet Take 1 tablet (48 mg total) by mouth daily.  Marland Kitchen FLUoxetine (PROZAC) 20 MG capsule Take 2 capsules (40 mg total) by mouth daily.  Marland Kitchen icosapent Ethyl (VASCEPA) 1 g capsule Take 2 capsules (2 g total) by mouth 2 (two) times daily.  . Insulin Pen Needle (GLOBAL EASE INJECT PEN NEEDLES) 31G X 5 MM MISC USE ONCE EVERY DAY  . liraglutide (VICTOZA) 18 MG/3ML SOPN Inject 0.2 mLs (1.2 mg total) into the skin daily.  Marland Kitchen losartan (COZAAR) 25 MG tablet Take 1 tablet (25 mg total) by mouth daily.  . medroxyPROGESTERone (PROVERA) 5 MG tablet TAKE 1 TABLET BY MOUTH DAILY  . metFORMIN (GLUCOPHAGE) 1000 MG tablet TAKE 1 TABLET BY MOUTH TWICE DAILY  . nitroGLYCERIN (NITROSTAT) 0.4 MG SL tablet DISSOLVE 1 TABLET UNDER THE TONGUE EVERY 5 MINUTES AS NEEDED FOR CHEST PAIN. DO NOT EXCEED A TOTAL OF 3 DOSES IN 15 MINUTES. (Patient taking differently: Place 0.4 mg under the tongue every 5 (five) minutes as needed for chest pain. )  . pantoprazole (PROTONIX) 40 MG tablet TAKE 1 TABLET BY MOUTH DAILY  . potassium chloride SA (KLOR-CON) 20 MEQ tablet TAKE 1 TABLET BY MOUTH EVERY DAY  . pregabalin (LYRICA) 100 MG capsule Take 1 capsule (100 mg total) by mouth 2 (two) times daily.  . ranolazine (RANEXA) 1000 MG SR tablet Take 1 tablet (1,000 mg total) by mouth 2 (two) times daily.  . rosuvastatin (CRESTOR) 40 MG tablet TAKE 1 TABLET BY MOUTH EVERY DAY (Patient taking differently: TAKE 1 TABLET BY MOUTH EVERY DAY)  . torsemide (DEMADEX) 20 MG tablet Take 2 tablets (40 mg total) by mouth daily as needed  (swelling).  . traMADol (ULTRAM) 50 MG tablet Take 1-2 tablets (50-100 mg total) by mouth every 12 (twelve) hours as needed for severe pain (ONLY IF NEEDED).   No facility-administered encounter medications on file as of 10/15/2020.     Scribe for Treatment Team: Lubertha South, LCSW

## 2020-10-21 ENCOUNTER — Other Ambulatory Visit: Payer: Self-pay | Admitting: Cardiology

## 2020-10-21 ENCOUNTER — Other Ambulatory Visit: Payer: Self-pay | Admitting: Family Medicine

## 2020-10-21 DIAGNOSIS — E1122 Type 2 diabetes mellitus with diabetic chronic kidney disease: Secondary | ICD-10-CM

## 2020-10-21 DIAGNOSIS — R110 Nausea: Secondary | ICD-10-CM

## 2020-10-21 DIAGNOSIS — N182 Chronic kidney disease, stage 2 (mild): Secondary | ICD-10-CM

## 2020-10-21 DIAGNOSIS — R142 Eructation: Secondary | ICD-10-CM

## 2020-10-22 NOTE — Patient Instructions (Signed)
[name]  10/22/2020     @PREFPERIOPPHARMACY @   Your procedure is scheduled on  10/27/2020   Report to [facility] at  0900  A.M.   Call this number if you have problems the morning of surgery:  [phone]   Remember:  Follow the diet instructions given to you by the office.                      Take these medicines the morning of surgery with A SIP OF WATER              Buspar, carvedilol, prozac, vascepa, protonix, lyrica.   Brush your teeth.  Do not wear jewelry, make-up or nail polish.  Do not wear lotions, powders, or perfumes, or deodorant.  Do not shave 48 hours prior to surgery.  Men may shave face and neck.  Do not bring valuables to the hospital.  [facility] is not responsible for any belongings or valuables.  Contacts, dentures or bridgework may not be worn into surgery.  Leave your suitcase in the car.  After surgery it may be brought to your room.  For patients admitted to the hospital, discharge time will be determined by your treatment team.  Patients discharged the day of surgery will not be allowed to drive home and must have someone with them for 24 hours.   Special instructions:  DO NOT smoke (tobacco or vape) the morning of your procedure.  Please read over the following fact sheets that you were given. Anesthesia Post-op Instructions and Care and Recovery After Surgery       Upper Endoscopy, Adult, Care After This sheet gives you information about how to care for yourself after your procedure. Your health care provider may also give you more specific instructions. If you have problems or questions, contact your health care provider. What can I expect after the procedure? After the procedure, it is common to have:  A sore throat.  Mild stomach pain or discomfort.  Bloating.  Nausea. Follow these instructions at home:  Follow instructions from your health care provider about what to eat or drink after your  procedure.  Return to your normal activities as told by your health care provider. Ask your health care provider what activities are safe for you.  Take over-the-counter and prescription medicines only as told by your health care provider.  If you were given a sedative during the procedure, it can affect you for several hours. Do not drive or operate machinery until your health care provider says that it is safe.  Keep all follow-up visits as told by your health care provider. This is important.   Contact a health care provider if you have:  A sore throat that lasts longer than one day.  Trouble swallowing. Get help right away if:  You vomit blood or your vomit looks like coffee grounds.  You have: ? A fever. ? Bloody, black, or tarry stools. ? A severe sore throat or you cannot swallow. ? Difficulty breathing. ? Severe pain in your chest or abdomen. Summary  After the procedure, it is common to have a sore throat, mild stomach discomfort, bloating, and nausea.  If you were given a sedative during the procedure, it can affect you for several hours. Do not drive or operate machinery until your health care provider says that it is safe.  Follow instructions from your health care provider about what to eat  or drink after your procedure.  Return to your normal activities as told by your health care provider. This information is not intended to replace advice given to you by your health care provider. Make sure you discuss any questions you have with your health care provider. Document Revised: 09/11/2019 Document Reviewed: 02/13/2018 Elsevier Patient Education  2021 Elsevier Inc. Monitored Anesthesia Care, Care After This sheet gives you information about how to care for yourself after your procedure. Your health care provider may also give you more specific instructions. If you have problems or questions, contact your health care provider. What can I expect after the  procedure? After the procedure, it is common to have:  Tiredness.  Forgetfulness about what happened after the procedure.  Impaired judgment for important decisions.  Nausea or vomiting.  Some difficulty with balance. Follow these instructions at home: For the time period you were told by your health care provider:  Rest as needed.  Do not participate in activities where you could fall or become injured.  Do not drive or use machinery.  Do not drink alcohol.  Do not take sleeping pills or medicines that cause drowsiness.  Do not make important decisions or sign legal documents.  Do not take care of children on your own.      Eating and drinking  Follow the diet that is recommended by your health care provider.  Drink enough fluid to keep your urine pale yellow.  If you vomit: ? Drink water, juice, or soup when you can drink without vomiting. ? Make sure you have little or no nausea before eating solid foods. General instructions  Have a responsible adult stay with you for the time you are told. It is important to have someone help care for you until you are awake and alert.  Take over-the-counter and prescription medicines only as told by your health care provider.  If you have sleep apnea, surgery and certain medicines can increase your risk for breathing problems. Follow instructions from your health care provider about wearing your sleep device: ? Anytime you are sleeping, including during daytime naps. ? While taking prescription pain medicines, sleeping medicines, or medicines that make you drowsy.  Avoid smoking.  Keep all follow-up visits as told by your health care provider. This is important. Contact a health care provider if:  You keep feeling nauseous or you keep vomiting.  You feel light-headed.  You are [place] sleepy or having trouble with balance after 24 hours.  You develop a rash.  You have a fever.  You have redness or swelling around  the IV site. Get help right away if:  You have trouble breathing.  You have new-onset confusion at home. Summary  For several hours after your procedure, you may feel tired. You may also be forgetful and have poor judgment.  Have a responsible adult stay with you for the time you are told. It is important to have someone help care for you until you are awake and alert.  Rest as told. Do not drive or operate machinery. Do not drink alcohol or take sleeping pills.  Get help right away if you have trouble breathing, or if you suddenly become confused. This information is not intended to replace advice given to you by your health care provider. Make sure you discuss any questions you have with your health care provider. Document Revised: 05/29/2020 Document Reviewed: 08/16/2019 Elsevier Patient Education  2021 [employer].

## 2020-10-23 ENCOUNTER — Other Ambulatory Visit: Payer: Self-pay | Admitting: Family Medicine

## 2020-10-23 DIAGNOSIS — F411 Generalized anxiety disorder: Secondary | ICD-10-CM

## 2020-10-24 ENCOUNTER — Encounter (HOSPITAL_COMMUNITY): Payer: Self-pay

## 2020-10-24 ENCOUNTER — Other Ambulatory Visit: Payer: Self-pay

## 2020-10-24 ENCOUNTER — Other Ambulatory Visit (HOSPITAL_COMMUNITY)
Admission: RE | Admit: 2020-10-24 | Discharge: 2020-10-24 | Disposition: A | Discharge status: Home / Self Care | Payer: Medicaid Other | Source: Ambulatory Visit | Attending: Internal Medicine | Admitting: Internal Medicine

## 2020-10-24 ENCOUNTER — Encounter (HOSPITAL_COMMUNITY)
Admission: RE | Admit: 2020-10-24 | Discharge: 2020-10-24 | Disposition: A | Discharge status: Home / Self Care | Payer: Medicaid Other | Source: Ambulatory Visit | Attending: Internal Medicine | Admitting: Internal Medicine

## 2020-10-24 DIAGNOSIS — Z20822 Contact with and (suspected) exposure to covid-19: Secondary | ICD-10-CM | POA: Insufficient documentation

## 2020-10-24 DIAGNOSIS — Z01812 Encounter for preprocedural laboratory examination: Secondary | ICD-10-CM | POA: Diagnosis not present

## 2020-10-24 HISTORY — DX: Angina pectoris, unspecified: I20.9

## 2020-10-24 HISTORY — DX: Myoneural disorder, unspecified: G70.9

## 2020-10-24 HISTORY — DX: Dyspnea, unspecified: R06.00

## 2020-10-24 LAB — CBC WITH DIFFERENTIAL/PLATELET
Abs Immature Granulocytes: 0.11 10*3/uL — ABNORMAL HIGH (ref 0.00–0.07)
Basophils Absolute: 0 10*3/uL (ref 0.0–0.1)
Basophils Relative: 0 %
Eosinophils Absolute: 0.2 10*3/uL (ref 0.0–0.5)
Eosinophils Relative: 1 %
HCT: 40.5 % (ref 36.0–46.0)
Hemoglobin: 13.4 g/dL (ref 12.0–15.0)
Immature Granulocytes: 1 %
Lymphocytes Relative: 28 %
Lymphs Abs: 3.3 10*3/uL (ref 0.7–4.0)
MCH: 30.4 pg (ref 26.0–34.0)
MCHC: 33.1 g/dL (ref 30.0–36.0)
MCV: 91.8 fL (ref 80.0–100.0)
Monocytes Absolute: 0.7 10*3/uL (ref 0.1–1.0)
Monocytes Relative: 6 %
Neutro Abs: 7.7 10*3/uL (ref 1.7–7.7)
Neutrophils Relative %: 64 %
Platelets: 256 10*3/uL (ref 150–400)
RBC: 4.41 MIL/uL (ref 3.87–5.11)
RDW: 12.8 % (ref 11.5–15.5)
WBC: 11.9 10*3/uL — ABNORMAL HIGH (ref 4.0–10.5)
nRBC: 0 % (ref 0.0–0.2)

## 2020-10-24 LAB — LIPID PANEL
Cholesterol: 160 mg/dL (ref 0–200)
HDL: 35 mg/dL — ABNORMAL LOW (ref >40)
LDL Cholesterol: 94 mg/dL (ref 0–99)
Total CHOL/HDL Ratio: 4.6 RATIO
Triglycerides: 157 mg/dL — ABNORMAL HIGH (ref <150)
VLDL: 31 mg/dL (ref 0–40)

## 2020-10-24 LAB — BASIC METABOLIC PANEL
Anion gap: 8 (ref 5–15)
BUN: 10 mg/dL (ref 6–20)
CO2: 20 mmol/L — ABNORMAL LOW (ref 22–32)
Calcium: 8.7 mg/dL — ABNORMAL LOW (ref 8.9–10.3)
Chloride: 104 mmol/L (ref 98–111)
Creatinine, Ser: 0.63 mg/dL (ref 0.44–1.00)
GFR, Estimated: >60 mL/min (ref >60)
Glucose, Bld: 131 mg/dL — ABNORMAL HIGH (ref 70–99)
Potassium: 3.4 mmol/L — ABNORMAL LOW (ref 3.5–5.1)
Sodium: 132 mmol/L — ABNORMAL LOW (ref 135–145)

## 2020-10-24 LAB — SARS CORONAVIRUS 2 (TAT 6-24 HRS): SARS Coronavirus 2: NEGATIVE

## 2020-10-24 LAB — HCG, SERUM, QUALITATIVE: Preg, Serum: NEGATIVE

## 2020-10-25 LAB — IGA: IgA: 254 mg/dL (ref 87–352)

## 2020-10-26 LAB — TISSUE TRANSGLUTAMINASE, IGA: Tissue Transglutaminase Ab, IgA: <2 U/mL (ref 0–3)

## 2020-10-27 ENCOUNTER — Encounter (HOSPITAL_COMMUNITY): Payer: Self-pay | Admitting: Internal Medicine

## 2020-10-27 ENCOUNTER — Other Ambulatory Visit: Payer: Self-pay

## 2020-10-27 ENCOUNTER — Ambulatory Visit (HOSPITAL_COMMUNITY)
Admission: RE | Admit: 2020-10-27 | Discharge: 2020-10-27 | Disposition: A | Discharge status: Home / Self Care | Payer: Medicaid Other | Attending: Internal Medicine | Admitting: Internal Medicine

## 2020-10-27 ENCOUNTER — Encounter (HOSPITAL_COMMUNITY)
Admission: RE | Disposition: A | Discharge status: Home / Self Care | Payer: Self-pay | Source: Home / Self Care | Attending: Internal Medicine

## 2020-10-27 ENCOUNTER — Ambulatory Visit (HOSPITAL_COMMUNITY): Payer: Medicaid Other | Admitting: Anesthesiology

## 2020-10-27 DIAGNOSIS — K2950 Unspecified chronic gastritis without bleeding: Secondary | ICD-10-CM | POA: Diagnosis not present

## 2020-10-27 DIAGNOSIS — Z7982 Long term (current) use of aspirin: Secondary | ICD-10-CM | POA: Insufficient documentation

## 2020-10-27 DIAGNOSIS — R197 Diarrhea, unspecified: Secondary | ICD-10-CM | POA: Diagnosis not present

## 2020-10-27 DIAGNOSIS — F17210 Nicotine dependence, cigarettes, uncomplicated: Secondary | ICD-10-CM | POA: Diagnosis not present

## 2020-10-27 DIAGNOSIS — Z79899 Other long term (current) drug therapy: Secondary | ICD-10-CM | POA: Insufficient documentation

## 2020-10-27 DIAGNOSIS — Z955 Presence of coronary angioplasty implant and graft: Secondary | ICD-10-CM | POA: Diagnosis not present

## 2020-10-27 DIAGNOSIS — Z7902 Long term (current) use of antithrombotics/antiplatelets: Secondary | ICD-10-CM | POA: Diagnosis not present

## 2020-10-27 DIAGNOSIS — K2100 Gastro-esophageal reflux disease with esophagitis, without bleeding: Secondary | ICD-10-CM | POA: Diagnosis not present

## 2020-10-27 DIAGNOSIS — R1013 Epigastric pain: Secondary | ICD-10-CM | POA: Insufficient documentation

## 2020-10-27 DIAGNOSIS — B9681 Helicobacter pylori [H. pylori] as the cause of diseases classified elsewhere: Secondary | ICD-10-CM | POA: Diagnosis not present

## 2020-10-27 DIAGNOSIS — K209 Esophagitis, unspecified without bleeding: Secondary | ICD-10-CM | POA: Diagnosis not present

## 2020-10-27 DIAGNOSIS — R142 Eructation: Secondary | ICD-10-CM | POA: Diagnosis not present

## 2020-10-27 DIAGNOSIS — Z794 Long term (current) use of insulin: Secondary | ICD-10-CM | POA: Insufficient documentation

## 2020-10-27 DIAGNOSIS — I10 Essential (primary) hypertension: Secondary | ICD-10-CM | POA: Diagnosis not present

## 2020-10-27 DIAGNOSIS — Z7984 Long term (current) use of oral hypoglycemic drugs: Secondary | ICD-10-CM | POA: Diagnosis not present

## 2020-10-27 DIAGNOSIS — E119 Type 2 diabetes mellitus without complications: Secondary | ICD-10-CM | POA: Diagnosis not present

## 2020-10-27 HISTORY — PX: BIOPSY: SHX5522

## 2020-10-27 HISTORY — PX: ESOPHAGOGASTRODUODENOSCOPY (EGD) WITH PROPOFOL: SHX5813

## 2020-10-27 LAB — GLUCOSE, CAPILLARY: Glucose-Capillary: 183 mg/dL — ABNORMAL HIGH (ref 70–99)

## 2020-10-27 SURGERY — ESOPHAGOGASTRODUODENOSCOPY (EGD) WITH PROPOFOL
Anesthesia: General

## 2020-10-27 MED ORDER — STERILE WATER FOR IRRIGATION IR SOLN
Status: DC | PRN
  Administered 2020-10-27: 1.5 mL

## 2020-10-27 MED ORDER — KETAMINE HCL 50 MG/5ML IJ SOSY
PREFILLED_SYRINGE | INTRAMUSCULAR | Status: AC
  Filled 2020-10-27: qty 5

## 2020-10-27 MED ORDER — GLYCOPYRROLATE 0.2 MG/ML IJ SOLN
INTRAMUSCULAR | Status: AC
  Filled 2020-10-27: qty 1

## 2020-10-27 MED ORDER — PROPOFOL 10 MG/ML IV BOLUS
INTRAVENOUS | Status: DC | PRN
  Administered 2020-10-27: 20 mg via INTRAVENOUS
  Administered 2020-10-27: 80 mg via INTRAVENOUS

## 2020-10-27 MED ORDER — PROPOFOL 500 MG/50ML IV EMUL
INTRAVENOUS | Status: DC | PRN
  Administered 2020-10-27: 150 ug/kg/min via INTRAVENOUS

## 2020-10-27 MED ORDER — CHLORHEXIDINE GLUCONATE CLOTH 2 % EX PADS: 6.0000 | MEDICATED_PAD | Freq: Once | CUTANEOUS | Status: DC

## 2020-10-27 MED ORDER — LACTATED RINGERS IV SOLN: INTRAVENOUS | Status: DC

## 2020-10-27 MED ORDER — LIDOCAINE VISCOUS HCL 2 % MT SOLN
15.0000 mL | Freq: Once | OROMUCOSAL | Status: AC
  Administered 2020-10-27: 15 mL via OROMUCOSAL

## 2020-10-27 MED ORDER — GLYCOPYRROLATE 0.2 MG/ML IJ SOLN
0.2000 mg | Freq: Once | INTRAMUSCULAR | Status: AC
  Administered 2020-10-27: 0.2 mg via INTRAVENOUS

## 2020-10-27 MED ORDER — LIDOCAINE HCL (CARDIAC) PF 100 MG/5ML IV SOSY
PREFILLED_SYRINGE | INTRAVENOUS | Status: DC | PRN
  Administered 2020-10-27: 50 mg via INTRAVENOUS

## 2020-10-27 MED ORDER — KETAMINE HCL 10 MG/ML IJ SOLN
INTRAMUSCULAR | Status: DC | PRN
Start: 2020-10-27 — End: 2020-10-27
  Administered 2020-10-27: 20 mg via INTRAVENOUS

## 2020-10-27 MED ORDER — LIDOCAINE VISCOUS HCL 2 % MT SOLN
OROMUCOSAL | Status: AC
  Filled 2020-10-27: qty 15

## 2020-10-27 NOTE — Transfer of Care (Signed)
Immediate Anesthesia Transfer of Care Note  Patient: [name]  Procedure(s) Performed: ESOPHAGOGASTRODUODENOSCOPY (EGD) WITH PROPOFOL (N/A ) BIOPSY  Patient Location: PACU  Anesthesia Type:General  Level of Consciousness: awake  Airway & Oxygen Therapy: Patient Spontanous Breathing  Post-op Assessment: Report given to RN and Post -op Vital signs reviewed and stable  Post vital signs: Reviewed and stable  Last Vitals:  Vitals Value Taken Time  BP    Temp    Pulse 81 10/27/20 1032  Resp 18 10/27/20 1032  SpO2 99 % 10/27/20 1032  Vitals shown include unvalidated device data.  Last Pain:  Vitals:   10/27/20 1014  PainSc: 0-No pain      Patients Stated Pain Goal: 10 (10/27/20 2761)  Complications: No complications documented.

## 2020-10-27 NOTE — H&P (Signed)
_0 @   Primary Care Physician:  Janora Norlander, DO Primary Gastroenterologist:  Dr. Gala Romney  Pre-Procedure History & Physical: HPI:  [name] is a 40 y.o. female here for further evaluation of belching/burping.  Diarrhea.  Celiac screen negative.  Protonix 40 mg daily has made no difference in symptoms.  He has no dysphagia.  However Colestid has made a dramatic difference in her chronic diarrhea.  It has resolved with this regimen.  She is very happy.  Past Medical History:  Diagnosis Date  . Allergy   . Anemia   . Anginal pain ([place])   . Anxiety   . CHF (congestive heart failure) ([place])   . Chronic kidney disease    history of kidney stones  . Coronary artery disease 09/2011   a.s/p BMS to LAD and angioplasty of RI in 09/2011 b. patent stent by cath in 02/2012, 12/2013, and 02/2016 with most recent showing 30-40% dLAD and 30-40% RCA stenosis  . Depression   . Diabetes mellitus   . Dyspnea   . Hyperlipidemia   . Hypertension   . Midsternal chest pain 03/03/2012  . Migraines   . Morbid obesity ([place])   . Myocardial infarct ([place]) 09/2011  . Myocardial infarction ([place]) 02/2016  . Neuromuscular disorder ([place])   . Palpitations 02/17/2012  . Ureteral stone with hydronephrosis   . Urinary tract obstruction due to kidney stone 02/15/2017    Past Surgical History:  Procedure Laterality Date  . CARDIAC CATHETERIZATION  10/08/2011   LAD: 95% mid, Ramus: 95% ostial  . CARPAL TUNNEL RELEASE    . CESAREAN SECTION    . CHOLECYSTECTOMY    . CORONARY ANGIOPLASTY WITH STENT PLACEMENT  10/08/2011   LAD: BMS, Ramus: cutting balloon angioplasty  . CORONARY STENT INTERVENTION N/A 01/14/2020   Procedure: CORONARY STENT INTERVENTION;  Surgeon: Leonie Man, MD;  Location: [place] [facility];  Service: Cardiovascular;  Laterality: N/A;  . CYSTOSCOPY WITH RETROGRADE PYELOGRAM, URETEROSCOPY AND STENT PLACEMENT Left 02/16/2017   Procedure: CYSTOSCOPY WITH RETROGRADE PYELOGRAM,  URETEROSCOPY AND STENT PLACEMENT;  Surgeon: Kathie Rhodes, MD;  Location: [facility];  Service: Urology;  Laterality: Left;  . CYSTOSCOPY WITH RETROGRADE PYELOGRAM, URETEROSCOPY AND STENT PLACEMENT Left 03/28/2017   Procedure: LEFT STENT REMOVAL LEFT RETROGRADE PYELOGRAM LEFT URETEROSCOPY   LASER LITHOTRIPSY  AND  STENT REPLACEMENT;  Surgeon: Cleon Gustin, MD;  Location: [facility];  Service: Urology;  Laterality: Left;  . LEFT HEART CATH AND CORONARY ANGIOGRAPHY N/A 08/15/2018   Procedure: LEFT HEART CATH AND CORONARY ANGIOGRAPHY;  Surgeon: Martinique, Peter M, MD;  Location: [place] [facility];  Service: Cardiovascular;  Laterality: N/A;  . LEFT HEART CATH AND CORONARY ANGIOGRAPHY N/A 01/14/2020   Procedure: LEFT HEART CATH AND CORONARY ANGIOGRAPHY;  Surgeon: Leonie Man, MD;  Location: [place] [facility];  Service: Cardiovascular;  Laterality: N/A;  . LEFT HEART CATHETERIZATION WITH CORONARY ANGIOGRAM N/A 10/08/2011   Procedure: LEFT HEART CATHETERIZATION WITH CORONARY ANGIOGRAM;  Surgeon: Peter M Martinique, MD;  Location: [facility] C[facility] LAB;  Service: Cardiovascular;  Laterality: N/A;  . LEFT HEART CATHETERIZATION WITH CORONARY ANGIOGRAM N/A 03/02/2012   Procedure: LEFT HEART CATHETERIZATION WITH CORONARY ANGIOGRAM;  Surgeon: Burnell Blanks, MD;  Location: [facility] CATH LAB;  Service: Cardiovascular;  Laterality: N/A;  . LEFT HEART CATHETERIZATION WITH CORONARY ANGIOGRAM N/A 01/04/2014   Procedure: LEFT HEART CATHETERIZATION WITH CORONARY ANGIOGRAM;  Surgeon: Troy Sine, MD;  Location: [facility] CATH LAB;  Service: Cardiovascular;  Laterality:  N/A;  . right hand pinky surgery      Prior to Admission medications   Medication Sig Start Date End Date Taking? Authorizing Provider  aspirin 81 MG chewable tablet Chew 81 mg by mouth daily.   Yes [provider]  busPIRone (BUSPAR) 5 MG tablet Take 1 tablet (5 mg total) by mouth 2 (two) times daily for 7 days, THEN 1.5 tablets (7.5 mg total) 2 (two) times  daily for 7 days, THEN 2 tablets (10 mg total) 2 (two) times daily for 16 days. Patient taking differently: 2 tablets (10 mg total) 2 (two) times daily 10/01/20 10/31/20 Yes Gottschalk, Ashly M, DO  carvedilol (COREG) 25 MG tablet Take 1 tablet (25 mg total) by mouth 2 (two) times daily with a meal. TAKE 1 TABLET BY MOUTH TWICE DAILY WITH A MEAL 09/22/20  Yes Branch, Alphonse Guild, MD  clopidogrel (PLAVIX) 75 MG tablet take 300 mg (4 tablets) on Day 1, then take 75 mg (1 tablet) daily. Patient taking differently: Take 75 mg by mouth daily. 01/15/20  Yes BranchAlphonse Guild, MD  colestipol (COLESTID) 1 g tablet Take 2 tablets (2 g total) by mouth daily. Do not take within 4 hours of other medications. 06/27/20  Yes Mahala Menghini, PA-C  fenofibrate (TRICOR) 48 MG tablet Take 1 tablet (48 mg total) by mouth daily. 04/08/20  Yes Gottschalk, Leatrice Jewels M, DO  FLUoxetine (PROZAC) 20 MG capsule TAKE THREE CAPSULES BY MOUTH DAILY 10/24/20  Yes Gottschalk, Ashly M, DO  icosapent Ethyl (VASCEPA) 1 g capsule Take 2 capsules (2 g total) by mouth 2 (two) times daily. 01/07/20  Yes Hilty, Nadean Corwin, MD  liraglutide (VICTOZA) 18 MG/3ML SOPN Inject 0.2 mLs (1.2 mg total) into the skin daily. 04/08/20  Yes Gottschalk, Leatrice Jewels M, DO  losartan (COZAAR) 25 MG tablet Take 1 tablet (25 mg total) by mouth daily. 02/27/20  Yes Branch, Alphonse Guild, MD  medroxyPROGESTERone (PROVERA) 5 MG tablet TAKE 1 TABLET BY MOUTH DAILY Patient taking differently: Take 5 mg by mouth daily. 09/22/20  Yes Gottschalk, Leatrice Jewels M, DO  metFORMIN (GLUCOPHAGE) 1000 MG tablet TAKE 1 TABLET BY MOUTH TWICE DAILY 10/21/20  Yes Gottschalk, Ashly M, DO  nitroGLYCERIN (NITROSTAT) 0.4 MG SL tablet DISSOLVE 1 TABLET UNDER THE TONGUE EVERY 5 MINUTES AS NEEDED FOR CHEST PAIN. DO NOT EXCEED A TOTAL OF 3 DOSES IN 15 MINUTES. Patient taking differently: Place 0.4 mg under the tongue every 5 (five) minutes as needed for chest pain. 10/09/19  Yes Arnoldo Lenis, MD  pantoprazole  (PROTONIX) 40 MG tablet TAKE 1 TABLET BY MOUTH DAILY 10/21/20  Yes Gottschalk, Ashly M, DO  potassium chloride SA (KLOR-CON) 20 MEQ tablet TAKE 1 TABLET BY MOUTH EVERY DAY Patient taking differently: Take 20 mEq by mouth daily as needed (when taking Torsemide). 10/01/20  Yes Branch, Alphonse Guild, MD  pregabalin (LYRICA) 100 MG capsule Take 1 capsule (100 mg total) by mouth 2 (two) times daily. 08/19/20  Yes Gottschalk, Leatrice Jewels M, DO  ranolazine (RANEXA) 1000 MG SR tablet Take 1 tablet (1,000 mg total) by mouth 2 (two) times daily. 01/09/20  Yes BranchAlphonse Guild, MD  rosuvastatin (CRESTOR) 40 MG tablet Take 1 tablet (40 mg total) by mouth daily. 10/21/20  Yes Branch, Alphonse Guild, MD  traMADol (ULTRAM) 50 MG tablet Take 1-2 tablets (50-100 mg total) by mouth every 12 (twelve) hours as needed for severe pain (ONLY IF NEEDED). 08/19/20  Yes Gottschalk, [place], DO  ACCU-CHEK AVIVA  PLUS test strip USE TO TEST BLOOD SUGAR FOUR TIMES DAILY 08/06/19   Ronnie Doss M, DO  Accu-Chek Softclix Lancets lancets Check BS four times daily Dx E11.9 05/02/20   Ronnie Doss M, DO  blood glucose meter kit and supplies Dispense based on patient and insurance preference. Use up to four times daily as directed. (FOR ICD-10:  E11.22). Check BG daily 06/26/18   Ronnie Doss M, DO  Insulin Pen Needle (GLOBAL EASE INJECT PEN NEEDLES) 31G X 5 MM MISC USE ONCE EVERY DAY 01/30/20   Ronnie Doss M, DO  torsemide (DEMADEX) 20 MG tablet Take 2 tablets (40 mg total) by mouth daily as needed (swelling). 08/13/20   Arnoldo Lenis, MD    Allergies as of 07/17/2020 - Review Complete 07/15/2020  Allergen Reaction Noted  . Bee venom Anaphylaxis and Swelling 06/23/2012  . Latex Itching 08/14/2018  . Sulfa drugs cross reactors Nausea And Vomiting 10/08/2011    Family History  Problem Relation Age of Onset  . Heart murmur Father   . Diabetes Father   . Hypertension Father   . Hyperlipidemia Father   . Cancer Father         throat  . Diabetes Mother   . Cancer Mother        breast.uterine, ovarian  . Asthma Son   . Appendicitis Son   . Hernia Son   . Mental illness Son        Bipolar, personality d/o  . Stroke Maternal Grandmother   . Cancer Maternal Grandmother        breast  . Cancer Paternal Grandfather        lung  . Colon cancer Neg Hx     Social History   Socioeconomic History  . Marital status: Married    Spouse name: Barnabas Lister  . Number of children: 2  . Years of education: 10  . Highest education level: Not on file  Occupational History  . Not on file  Tobacco Use  . Smoking status: Current Every Day Smoker    Packs/day: 0.50    Years: 20.00    Pack years: 10.00    Types: Cigarettes    Start date: 03/18/1990  . Smokeless tobacco: Never Used  Vaping Use  . Vaping Use: Former  Substance and Sexual Activity  . Alcohol use: Yes    Alcohol/week: 0.0 standard drinks    Comment: maybe once a year  . Drug use: No  . Sexual activity: Yes    Birth control/protection: None    Comment: PCOS  Other Topics Concern  . Not on file  Social History Narrative   Unemployed   Applied for disability   Leisure "take care of house and kids"   Walks when able   Social Determinants of Health   Financial Resource Strain: Not on file  Food Insecurity: Not on file  Transportation Needs: Not on file  Physical Activity: Not on file  Stress: Not on file  Social Connections: Not on file  Intimate Partner Violence: Not on file    Review of Systems: See HPI, otherwise negative ROS  Physical Exam: BP 114/72   Pulse 86   Temp 98.5 F (36.9 C)   Resp 20   Ht _0  (1.753 m)   Wt (!) 169.6 kg   LMP  (Within Months) Comment: November  SpO2 97%   BMI 55.23 kg/m  General:   Alert, obese well-nourished, pleasant and cooperative in NAD Neck:  Supple; no  masses or thyromegaly. No significant cervical adenopathy. Lungs:  Clear throughout to auscultation.   No wheezes, crackles, or rhonchi. No  acute distress. Heart:  Regular rate and rhythm; no murmurs, clicks, rubs,  or gallops. Abdomen: Non-distended, normal bowel sounds.  Soft and nontender without appreciable mass or hepatosplenomegaly.  Pulses:  Normal pulses noted. Extremities:  Without clubbing or edema.  Impression/Plan: 40 year old lady with belching/burping.  Downplays reflux symptoms.  No change with PPI therapy.  Celiac screen negative.  Diarrhea essentially resolved on Colestid.  No dysphagia.  Here for an EGD to further evaluate. The risks, benefits, limitations, alternatives and imponderables have been reviewed with the patient. Potential for esophageal dilation, biopsy, etc. have also been reviewed.  Questions have been answered. All parties agreeable.     Notice: This dictation was prepared with Dragon dictation along with smaller phrase technology. Any transcriptional errors that result from this process are unintentional and may not be corrected upon review.

## 2020-10-27 NOTE — Op Note (Signed)
[facility] Patient Name: [name] Procedure Date: 10/27/2020 9:49 AM MRN: [id number] Date of Birth: [birth year] Attending MD: Gennette Pac , MD CSN: [id number] Age: 40 Admit Type: Outpatient Procedure:                Upper GI endoscopy Indications:              Dyspepsia, Eructation Providers:                Gennette Pac, MD, Angelica Ran, Edythe Clarity,                            Technician Referring MD:              Medicines:                Propofol per Anesthesia Complications:            No immediate complications. Estimated Blood Loss:     Estimated blood loss was minimal. Procedure:                Pre-Anesthesia Assessment:                           - Prior to the procedure, a History and Physical                            was performed, and patient medications and                            allergies were reviewed. The patient's tolerance of                            previous anesthesia was also reviewed. The risks                            and benefits of the procedure and the sedation                            options and risks were discussed with the patient.                            All questions were answered, and informed consent                            was obtained. Prior Anticoagulants: The patient has                            taken no previous anticoagulant or antiplatelet                            agents. ASA Grade Assessment: III - A patient with                            severe systemic disease. After reviewing the risks  and benefits, the patient was deemed in                            satisfactory condition to undergo the procedure.                           After obtaining informed consent, the endoscope was                            passed under direct vision. Throughout the                            procedure, the patient's blood pressure, pulse, and                            oxygen saturations  were monitored continuously. The                            GIF-H190 ([id number]) scope was introduced through the                            mouth, and advanced to the second part of duodenum.                            The upper GI endoscopy was accomplished without                            difficulty. The patient tolerated the procedure                            well. Scope In: 10:19:37 AM Scope Out: 10:26:33 AM Total Procedure Duration: 0 hours 6 minutes 56 seconds  Findings:      circumferential erosins with in 5 mm of GEJ; 2 linear erosions extending       up 2 cm. No Barrett's epithelium seen.      Gastric mucosa appeared abnormal diffusely with a reticulated apperance.       no ulcer or infiltraing process seen. pylorus patent.      The duodenal bulb and second portion of the duodenum were normal. Biopsy       of proximal, mid and distal stomach taken for histology. Impression:               - Mild erosive reflux esophagitis. Abnormal stomach                            as described - biopsied                           - Normal duodenal bulb and second portion of the                            duodenum. Moderate Sedation:      Moderate (conscious) sedation was personally administered by an       anesthesia professional. The following parameters were monitored: oxygen       saturation,  heart rate, blood pressure, respiratory rate, EKG, adequacy       of pulmonary ventilation, and response to care. Recommendation:           - Patient has a contact number available for                            emergencies. The signs and symptoms of potential                            delayed complications were discussed with the                            patient. Return to normal activities tomorrow.                            Written discharge instructions were provided to the                            patient.                           - Advance diet as tolerated. Stop Protonix; Begin                             Decilant 60 mg daily - new prescription provided. Procedure Code(s):        --- Professional ---                           [phone], Esophagogastroduodenoscopy, flexible,                            transoral; diagnostic, including collection of                            specimen(s) by brushing or washing, when performed                            (separate procedure) Diagnosis Code(s):        --- Professional ---                           K20.90, Esophagitis, unspecified without bleeding                           R10.13, Epigastric pain                           R14.2, Eructation CPT copyright 2019 [employer]. All rights reserved. The codes documented in this report are preliminary and upon coder review may  be revised to meet current compliance requirements. Gerrit Friends. Adetokunbo Mccadden, MD Gennette Pac, MD 10/27/2020 10:46:11 AM This report has been signed electronically. Number of Addenda: 0

## 2020-10-27 NOTE — Anesthesia Preprocedure Evaluation (Addendum)
Anesthesia Evaluation  Patient identified by MRN, date of birth, ID band Patient awake    Reviewed: Allergy & Precautions, NPO status , Patient's Chart, lab work & pertinent test results, reviewed documented beta blocker date and time   Airway Mallampati: II  TM Distance: >3 FB Neck ROM: Full    Dental  (+) Missing, Chipped, Poor Dentition, Dental Advisory Given   Pulmonary shortness of breath, Current Smoker and Patient abstained from smoking.,    Pulmonary exam normal breath sounds clear to auscultation       Cardiovascular Exercise Tolerance: Poor hypertension, Pt. on medications and Pt. on home beta blockers + angina with exertion + CAD, + Past MI, + Cardiac Stents (stentsx2,last stent in - 12/2019, on plavix and aspirin) and +CHF  Normal cardiovascular exam Rhythm:Regular Rate:Normal  Left ventricle: The cavity size was normal. Wall thickness  was normal. Systolic function was normal. The estimated  ejection fraction was in the range of 55% to 60%. Wall  motion was normal; there were no regional wall motion  abnormalities. Left ventricular diastolic function  parameters were normal.  - Mitral valve: Trivial regurgitation.  - Right atrium: Central venous pressure: 54mm Hg (est).  - Atrial septum: No defect or patent foramen ovale was  identified.  - Tricuspid valve: Trivial regurgitation.  - Pulmonary arteries: Systolic pressure could not be  accurately estimated.  - Pericardium, extracardiac: A prominent pericardial fat pad  was present.     Neuro/Psych  Headaches, PSYCHIATRIC DISORDERS Anxiety Depression  Neuromuscular disease    GI/Hepatic GERD  Medicated,  Endo/Other  diabetes, Well Controlled, Type 2, Oral Hypoglycemic AgentsMorbid obesity  Renal/GU Renal disease     Musculoskeletal negative musculoskeletal ROS (+)   Abdominal (+) + obese,   Peds  Hematology  (+) anemia ,   Anesthesia  Other Findings   Reproductive/Obstetrics negative OB ROS                            Anesthesia Physical Anesthesia Plan  ASA: III  Anesthesia Plan: General   Post-op Pain Management:    Induction:   PONV Risk Score and Plan: TIVA  Airway Management Planned: Natural Airway and Nasal Cannula  Additional Equipment:   Intra-op Plan:   Post-operative Plan:   Informed Consent: I have reviewed the patients History and Physical, chart, labs and discussed the procedure including the risks, benefits and alternatives for the proposed anesthesia with the patient or authorized representative who has indicated his/her understanding and acceptance.     Dental advisory given  Plan Discussed with: CRNA and Surgeon  Anesthesia Plan Comments:         Anesthesia Quick Evaluation

## 2020-10-27 NOTE — Anesthesia Postprocedure Evaluation (Signed)
Anesthesia Post Note  Patient: [name]  Procedure(s) Performed: ESOPHAGOGASTRODUODENOSCOPY (EGD) WITH PROPOFOL (N/A ) BIOPSY  Patient location during evaluation: PACU Anesthesia Type: General Level of consciousness: awake and alert and oriented Pain management: pain level controlled Vital Signs Assessment: post-procedure vital signs reviewed and stable Respiratory status: spontaneous breathing and respiratory function stable Cardiovascular status: blood pressure returned to baseline Postop Assessment: no apparent nausea or vomiting Anesthetic complications: no   No complications documented.   Last Vitals:  Vitals:   10/27/20 1045 10/27/20 1053  BP: 102/75 121/89  Pulse: 81 77  Resp: 19 18  Temp:  36.6 C  SpO2: 100% 98%    Last Pain:  Vitals:   10/27/20 1053  TempSrc: Oral  PainSc: 0-No pain                 [name] C Latravia [place]

## 2020-10-27 NOTE — Anesthesia Procedure Notes (Signed)
Date/Time: 10/27/2020 10:22 AM Performed by: Julian Reil, CRNA Pre-anesthesia Checklist: Patient identified, Suction available, Patient being monitored and Emergency Drugs available Patient Re-evaluated:Patient Re-evaluated prior to induction Oxygen Delivery Method: Nasal cannula Induction Type: IV induction Placement Confirmation: positive ETCO2

## 2020-10-27 NOTE — Discharge Instructions (Signed)
EGD Discharge instructions Please read the instructions outlined below and refer to this sheet in the next few weeks. These discharge instructions provide you with general information on caring for yourself after you leave the hospital. Your doctor may also give you specific instructions. While your treatment has been planned according to the most current medical practices available, unavoidable complications occasionally occur. If you have any problems or questions after discharge, please call your doctor. ACTIVITY  You may resume your regular activity but move at a slower pace for the next 24 hours.   Take frequent rest periods for the next 24 hours.   Walking will help expel (get rid of) the air and reduce the bloated feeling in your abdomen.   No driving for 24 hours (because of the anesthesia (medicine) used during the test).   You may shower.   Do not sign any important legal documents or operate any machinery for 24 hours (because of the anesthesia used during the test).  NUTRITION  Drink plenty of fluids.   You may resume your normal diet.   Begin with a light meal and progress to your normal diet.   Avoid alcoholic beverages for 24 hours or as instructed by your caregiver.  MEDICATIONS  You may resume your normal medications unless your caregiver tells you otherwise.  WHAT YOU CAN EXPECT TODAY  You may experience abdominal discomfort such as a feeling of fullness or gas pains.  FOLLOW-UP  Your doctor will discuss the results of your test with you.  SEEK IMMEDIATE MEDICAL ATTENTION IF ANY OF THE FOLLOWING OCCUR:  Excessive nausea (feeling sick to your stomach) and/or vomiting.   Severe abdominal pain and distention (swelling).   Trouble swallowing.   Temperature over 101 F (37.8 C).   Rectal bleeding or vomiting of blood.   You have acid reflux burns in your esophagus and an inflamed stomach.  Stomach biopsies taken  Stop Protonix  Begin Dexilant 60 mg  daily -new prescription provided  Further recommendations to follow pending review of pathology report  Office visit with Tana Coast in 1 month  At patient request, I called Ivin Booty at [phone]      Monitored Anesthesia Care, Care After This sheet gives you information about how to care for yourself after your procedure. Your health care provider may also give you more specific instructions. If you have problems or questions, contact your health care provider. What can I expect after the procedure? After the procedure, it is common to have:  Tiredness.  Forgetfulness about what happened after the procedure.  Impaired judgment for important decisions.  Nausea or vomiting.  Some difficulty with balance. Follow these instructions at home: For the time period you were told by your health care provider:  Rest as needed.  Do not participate in activities where you could fall or become injured.  Do not drive or use machinery.  Do not drink alcohol.  Do not take sleeping pills or medicines that cause drowsiness.  Do not make important decisions or sign legal documents.  Do not take care of children on your own.      Eating and drinking  Follow the diet that is recommended by your health care provider.  Drink enough fluid to keep your urine pale yellow.  If you vomit: ? Drink water, juice, or soup when you can drink without vomiting. ? Make sure you have little or no nausea before eating solid foods. General instructions  Have a responsible adult stay with  you for the time you are told. It is important to have someone help care for you until you are awake and alert.  Take over-the-counter and prescription medicines only as told by your health care provider.  If you have sleep apnea, surgery and certain medicines can increase your risk for breathing problems. Follow instructions from your health care provider about wearing your sleep device: ? Anytime you are  sleeping, including during daytime naps. ? While taking prescription pain medicines, sleeping medicines, or medicines that make you drowsy.  Avoid smoking.  Keep all follow-up visits as told by your health care provider. This is important. Contact a health care provider if:  You keep feeling nauseous or you keep vomiting.  You feel light-headed.  You are [place] sleepy or having trouble with balance after 24 hours.  You develop a rash.  You have a fever.  You have redness or swelling around the IV site. Get help right away if:  You have trouble breathing.  You have new-onset confusion at home. Summary  For several hours after your procedure, you may feel tired. You may also be forgetful and have poor judgment.  Have a responsible adult stay with you for the time you are told. It is important to have someone help care for you until you are awake and alert.  Rest as told. Do not drive or operate machinery. Do not drink alcohol or take sleeping pills.  Get help right away if you have trouble breathing, or if you suddenly become confused. This information is not intended to replace advice given to you by your health care provider. Make sure you discuss any questions you have with your health care provider. Document Revised: 05/29/2020 Document Reviewed: 08/16/2019 Elsevier Patient Education  2021 Elsevier Inc.    Dexlansoprazole capsules What is this medicine? DEXLANSOPRAZOLE (dex lan SOE pra zole) prevents the production of acid in the stomach. It is used to treat gastroesophageal reflux disease (GERD) and inflammation of the esophagus. This medicine may be used for other purposes; ask your health care provider or pharmacist if you have questions. COMMON BRAND NAME(S): Dexilant, Kapidex What should I tell my health care provider before I take this medicine? They need to know if you have any of these conditions:  liver disease  low levels of magnesium in the  blood  lupus  an unusual or allergic reaction to dexlansoprazole, other medicines, foods, dyes, or preservatives  pregnant or trying to get pregnant  breast-feeding How should I use this medicine? Take this medicine by mouth. Swallow the capsules whole with a drink of water. Follow the directions on the prescription label. Do not crush or chew. Take your medicine at regular intervals. Do not take more often than directed. If you have difficulty swallowing the capsules, you may open the capsule and sprinkle the contents on a tablespoon of applesauce. Do not crush the contents of the capsule into the food. Swallow the dose immediately after preparing it. Do not chew. Follow with a drink of water. A special MedGuide will be given to you before each treatment. Be sure to read this information carefully each time. Talk to your pediatrician regarding the use of this medicine in children. While this drug may be prescribed for children as young as 12 years for selected conditions, precautions do apply. Overdosage: If you think you have taken too much of this medicine contact a poison control center or emergency room at once. NOTE: This medicine is only for you. Do  not share this medicine with others. What if I miss a dose? If you miss a dose, take it as soon as you can. If it is almost time for your next dose, take only that dose. Do not take double or extra doses. What may interact with this medicine? Do not take this medicine with any of the following medications:  nelfinavir  rilpivirine  St. John's Wort This medicine may also interact with the following medications:  certain antiviral medicines for HIV or AIDS like atazanavir, saquinavir, ritonavir  certain medicines for fungal infections like ketoconazole, itraconazole, voriconazole  dasatinib  digoxin  erlotinib  iron salts  methotrexate  mycophenolate mofetil  nilotinib  tacrolimus  warfarin This list may not describe  all possible interactions. Give your health care provider a list of all the medicines, herbs, non-prescription drugs, or dietary supplements you use. Also tell them if you smoke, drink alcohol, or use illegal drugs. Some items may interact with your medicine. What should I watch for while using this medicine? It can take several days before your stomach pain gets better. Check with your doctor or health care professional if your condition does not start to get better, or if it gets worse. You may need blood work done while you are taking this medicine. This medicine may cause a decrease in vitamin B12. You should make sure that you get enough vitamin B12 while you are taking this medicine. Discuss the foods you eat and the vitamins you take with your health care professional. What side effects may I notice from receiving this medicine? Side effects that you should report to your doctor or health care professional as soon as possible:   allergic reactions like skin rash, itching or hives, swelling of the face, lips, or tongue   bone, muscle or joint pain   breathing problems   chest pain or chest tightness   dark yellow or brown urine   dizziness   fast, irregular heartbeat   feeling faint or lightheaded   fever or sore throat   muscle spasm   palpitations   rash on cheeks or arms that gets worse in the sun   redness, blistering, peeling or loosening of the skin, including inside the mouth   seizures  stomach polyps   tremors   unusual bleeding or bruising   unusually weak or tired   yellowing of the eyes or skin Side effects that usually do not require medical attention (report to your doctor or health care professional if they continue or are bothersome):   constipation   diarrhea   dry mouth   headache   nausea This list may not describe all possible side effects. Call your doctor for medical advice about side effects. You may report  side effects to FDA at 1-800-FDA-1088. Where should I keep my medicine? Keep out of the reach of children. Store at room temperature between 15 and 30 degrees C (59 and 86 degrees F). Protect from moisture. Throw away any unused medicine after the expiration date. NOTE: This sheet is a summary. It may not cover all possible information. If you have questions about this medicine, talk to your doctor, pharmacist, or health care provider.  2021 Elsevier/Gold Standard (2017-05-19 17:36:31)

## 2020-10-28 ENCOUNTER — Other Ambulatory Visit: Payer: Self-pay

## 2020-10-28 LAB — SURGICAL PATHOLOGY

## 2020-10-29 ENCOUNTER — Telehealth: Payer: Self-pay | Admitting: *Deleted

## 2020-10-29 ENCOUNTER — Encounter: Payer: Self-pay | Admitting: Family Medicine

## 2020-10-29 ENCOUNTER — Encounter: Payer: Self-pay | Admitting: Internal Medicine

## 2020-10-29 ENCOUNTER — Ambulatory Visit (INDEPENDENT_AMBULATORY_CARE_PROVIDER_SITE_OTHER): Payer: Medicaid Other | Admitting: Family Medicine

## 2020-10-29 DIAGNOSIS — F411 Generalized anxiety disorder: Secondary | ICD-10-CM

## 2020-10-29 DIAGNOSIS — G8929 Other chronic pain: Secondary | ICD-10-CM | POA: Diagnosis not present

## 2020-10-29 DIAGNOSIS — M5442 Lumbago with sciatica, left side: Secondary | ICD-10-CM

## 2020-10-29 DIAGNOSIS — F418 Other specified anxiety disorders: Secondary | ICD-10-CM

## 2020-10-29 DIAGNOSIS — M5441 Lumbago with sciatica, right side: Secondary | ICD-10-CM | POA: Diagnosis not present

## 2020-10-29 DIAGNOSIS — G43709 Chronic migraine without aura, not intractable, without status migrainosus: Secondary | ICD-10-CM

## 2020-10-29 MED ORDER — TRAMADOL HCL 50 MG PO TABS: 50.0000 mg | ORAL_TABLET | Freq: Two times a day (BID) | ORAL | 1 refills | Status: DC | PRN

## 2020-10-29 MED ORDER — BUSPIRONE HCL 10 MG PO TABS
10.0000 mg | ORAL_TABLET | Freq: Two times a day (BID) | ORAL | 12 refills | Status: DC
Start: 2020-10-29 — End: 2021-11-02

## 2020-10-29 NOTE — Telephone Encounter (Signed)
traMADol HCl 50MG  tablets Key: Sent to Plan today

## 2020-10-29 NOTE — Progress Notes (Signed)
Telephone visit  Subjective: CC: GAD PCP: Janora Norlander, DO HXT:AVWPVXYIA D Dault is a 40 y.o. female calls for telephone consult today. Patient provides verbal consent for consult held via phone.  Due to COVID-19 pandemic this visit was conducted virtually. This visit type was conducted due to national recommendations for restrictions regarding the COVID-19 Pandemic (e.g. social distancing, sheltering in place) in an effort to limit this patient's exposure and mitigate transmission in our community. All issues noted in this document were discussed and addressed.  A physical exam was not performed with this format.   Location of patient: home Location of provider: [facility] Others present for call: spouse  1. Anxiety Doing much better with Buspar 10 mg BID. No medication side effects.  Sleep is better.  2. Back pain Chronic stable.  She uses tramadol about 3-4 times per week (2 tabs per dose) and states symptoms are stable.  No constipation or excessive sedation.  3. Migraine headaches She reports that she has a migraine once per week.  Uses tramadol for severe headache refractory to OTC analgesics. Topamax trialed previously but was not helpful.  Never tried injectable/ nurtec.   ROS: Per HPI  Allergies  Allergen Reactions  . Bee Venom Anaphylaxis and Swelling  . Latex Itching  . Sulfa Drugs Cross Reactors Nausea And Vomiting   Past Medical History:  Diagnosis Date  . Allergy   . Anemia   . Anginal pain ([place])   . Anxiety   . CHF (congestive heart failure) ([place])   . Chronic kidney disease    history of kidney stones  . Coronary artery disease 09/2011   a.s/p BMS to LAD and angioplasty of RI in 09/2011 b. patent stent by cath in 02/2012, 12/2013, and 02/2016 with most recent showing 30-40% dLAD and 30-40% RCA stenosis  . Depression   . Diabetes mellitus   . Dyspnea   . Hyperlipidemia   . Hypertension   . Midsternal chest pain 03/03/2012  . Migraines   . Morbid obesity  ([place])   . Myocardial infarct ([place]) 09/2011  . Myocardial infarction ([place]) 02/2016  . Neuromuscular disorder ([place])   . Palpitations 02/17/2012  . Ureteral stone with hydronephrosis   . Urinary tract obstruction due to kidney stone 02/15/2017    Current Outpatient Medications:  .  ACCU-CHEK AVIVA PLUS test strip, USE TO TEST BLOOD SUGAR FOUR TIMES DAILY, Disp: 400 strip, Rfl: 3 .  Accu-Chek Softclix Lancets lancets, Check BS four times daily Dx E11.9, Disp: 100 each, Rfl: 11 .  aspirin 81 MG chewable tablet, Chew 81 mg by mouth daily., Disp: , Rfl:  .  blood glucose meter kit and supplies, Dispense based on patient and insurance preference. Use up to four times daily as directed. (FOR ICD-10:  E11.22). Check BG daily, Disp: 1 each, Rfl: 0 .  busPIRone (BUSPAR) 5 MG tablet, Take 1 tablet (5 mg total) by mouth 2 (two) times daily for 7 days, THEN 1.5 tablets (7.5 mg total) 2 (two) times daily for 7 days, THEN 2 tablets (10 mg total) 2 (two) times daily for 16 days. (Patient taking differently: 2 tablets (10 mg total) 2 (two) times daily), Disp: 100 tablet, Rfl: 0 .  carvedilol (COREG) 25 MG tablet, Take 1 tablet (25 mg total) by mouth 2 (two) times daily with a meal. TAKE 1 TABLET BY MOUTH TWICE DAILY WITH A MEAL, Disp: 180 tablet, Rfl: 1 .  clopidogrel (PLAVIX) 75 MG tablet, take 300 mg (4 tablets)  on Day 1, then take 75 mg (1 tablet) daily. (Patient taking differently: Take 75 mg by mouth daily.), Disp: 94 tablet, Rfl: 3 .  colestipol (COLESTID) 1 g tablet, Take 2 tablets (2 g total) by mouth daily. Do not take within 4 hours of other medications., Disp: 60 tablet, Rfl: 3 .  fenofibrate (TRICOR) 48 MG tablet, Take 1 tablet (48 mg total) by mouth daily., Disp: 90 tablet, Rfl: 3 .  FLUoxetine (PROZAC) 20 MG capsule, TAKE THREE CAPSULES BY MOUTH DAILY, Disp: 90 capsule, Rfl: 0 .  icosapent Ethyl (VASCEPA) 1 g capsule, Take 2 capsules (2 g total) by mouth 2 (two) times daily., Disp: 120 capsule, Rfl:  11 .  Insulin Pen Needle (GLOBAL EASE INJECT PEN NEEDLES) 31G X 5 MM MISC, USE ONCE EVERY DAY, Disp: 100 each, Rfl: 3 .  liraglutide (VICTOZA) 18 MG/3ML SOPN, Inject 0.2 mLs (1.2 mg total) into the skin daily., Disp: 12 mL, Rfl: 3 .  losartan (COZAAR) 25 MG tablet, Take 1 tablet (25 mg total) by mouth daily., Disp: 90 tablet, Rfl: 3 .  medroxyPROGESTERone (PROVERA) 5 MG tablet, TAKE 1 TABLET BY MOUTH DAILY (Patient taking differently: Take 5 mg by mouth daily.), Disp: 90 tablet, Rfl: 0 .  metFORMIN (GLUCOPHAGE) 1000 MG tablet, TAKE 1 TABLET BY MOUTH TWICE DAILY, Disp: 60 tablet, Rfl: 0 .  nitroGLYCERIN (NITROSTAT) 0.4 MG SL tablet, DISSOLVE 1 TABLET UNDER THE TONGUE EVERY 5 MINUTES AS NEEDED FOR CHEST PAIN. DO NOT EXCEED A TOTAL OF 3 DOSES IN 15 MINUTES. (Patient taking differently: Place 0.4 mg under the tongue every 5 (five) minutes as needed for chest pain.), Disp: 25 tablet, Rfl: 3 .  pantoprazole (PROTONIX) 40 MG tablet, TAKE 1 TABLET BY MOUTH DAILY, Disp: 30 tablet, Rfl: 0 .  potassium chloride SA (KLOR-CON) 20 MEQ tablet, TAKE 1 TABLET BY MOUTH EVERY DAY (Patient taking differently: Take 20 mEq by mouth daily as needed (when taking Torsemide).), Disp: 90 tablet, Rfl: 3 .  pregabalin (LYRICA) 100 MG capsule, Take 1 capsule (100 mg total) by mouth 2 (two) times daily., Disp: 60 capsule, Rfl: 5 .  ranolazine (RANEXA) 1000 MG SR tablet, Take 1 tablet (1,000 mg total) by mouth 2 (two) times daily., Disp: 180 tablet, Rfl: 3 .  rosuvastatin (CRESTOR) 40 MG tablet, Take 1 tablet (40 mg total) by mouth daily., Disp: 30 tablet, Rfl: 6 .  torsemide (DEMADEX) 20 MG tablet, Take 2 tablets (40 mg total) by mouth daily as needed (swelling)., Disp: 180 tablet, Rfl: 1 .  traMADol (ULTRAM) 50 MG tablet, Take 1-2 tablets (50-100 mg total) by mouth every 12 (twelve) hours as needed for severe pain (ONLY IF NEEDED)., Disp: 60 tablet, Rfl: 1  GAD 7 : Generalized Anxiety Score 10/29/2020 10/01/2020 08/19/2020 10/09/2019   Nervous, Anxious, on Edge 1 3 1 2   Control/stop worrying 0 2 1 1   Worry too much - different things 1 3 3 1   Trouble relaxing 3 3 3 1   Restless 0 2 2 0  Easily annoyed or irritable 1 1 2 1   Afraid - awful might happen 0 0 0 1  Total GAD 7 Score 6 14 12 7   Anxiety Difficulty Somewhat difficult Very difficult Somewhat difficult Somewhat difficult     Assessment/ Plan: 39 y.o. female   Generalized anxiety disorder - Plan: busPIRone (BUSPAR) 10 MG tablet  Situational anxiety - Plan: busPIRone (BUSPAR) 10 MG tablet  Chronic bilateral low back pain with bilateral sciatica - Plan:  traMADol (ULTRAM) 50 MG tablet  Chronic migraine without aura without status migrainosus, not intractable  Anxiety improving on Buspar 44m BID.  GAD improving. Will continue current dose.  Follow up in next month for recheck DM, GAD.  Counseled on Tramadol use.  Not indicated for headaches.  Recommend seeing me if having 1 migraine per week.  Can consider Emgality/ Aimovig.  Symptoms previously refractory to Topamax.  The Narcotic Database has been reviewed.  There were no red flags.    Start time: 12:03pm End time: 12:25pm  Total time spent on patient care (including telephone call/ virtual visit): 22 minutes  [place]([phone]

## 2020-10-31 NOTE — Telephone Encounter (Signed)
Form came in on pt - requesting provider documentation on dose, etc.  Placed on Dr Reece Agar desk

## 2020-11-04 NOTE — Telephone Encounter (Signed)
Re-sent PA - with different verbiage

## 2020-11-04 NOTE — Telephone Encounter (Signed)
Denied again   [employer] # and spoke with rep Claim is [place] open and we will add more info needed.  Per Dr Reece Agar " pt has chronic -ongoing back pain. Hx of MI and ongoing CAD. She can not take NSAIDS"   We faxed this note from Dr Reece Agar and now we wait for response

## 2020-11-05 NOTE — Telephone Encounter (Addendum)
PA approved for 11/05/20-05/04/21.   Pharmacy and patient aware.

## 2020-11-11 DIAGNOSIS — R200 Anesthesia of skin: Secondary | ICD-10-CM | POA: Diagnosis not present

## 2020-11-11 DIAGNOSIS — M5440 Lumbago with sciatica, unspecified side: Secondary | ICD-10-CM | POA: Diagnosis not present

## 2020-11-11 DIAGNOSIS — Z6841 Body Mass Index (BMI) 40.0 and over, adult: Secondary | ICD-10-CM | POA: Diagnosis not present

## 2020-11-11 DIAGNOSIS — R030 Elevated blood-pressure reading, without diagnosis of hypertension: Secondary | ICD-10-CM | POA: Diagnosis not present

## 2020-11-13 ENCOUNTER — Telehealth: Payer: Self-pay

## 2020-11-13 ENCOUNTER — Other Ambulatory Visit: Payer: Self-pay

## 2020-11-13 MED ORDER — AMOXICILL-CLARITHRO-LANSOPRAZ PO MISC: Freq: Two times a day (BID) | ORAL | 0 refills | Status: DC

## 2020-11-13 NOTE — Telephone Encounter (Signed)
Received PA request for Dexilant 60 mg. Prevpac will also need a PA. PA couldn't be completed on [email or web address]. spoke with [facility]) [phone]. They will call back so PA can be started or forms will be faxed to complete PA.

## 2020-11-18 ENCOUNTER — Other Ambulatory Visit: Payer: Self-pay | Admitting: Family Medicine

## 2020-11-18 DIAGNOSIS — F411 Generalized anxiety disorder: Secondary | ICD-10-CM

## 2020-11-18 DIAGNOSIS — R110 Nausea: Secondary | ICD-10-CM

## 2020-11-18 DIAGNOSIS — N182 Chronic kidney disease, stage 2 (mild): Secondary | ICD-10-CM

## 2020-11-18 DIAGNOSIS — R142 Eructation: Secondary | ICD-10-CM

## 2020-11-18 DIAGNOSIS — E1122 Type 2 diabetes mellitus with diabetic chronic kidney disease: Secondary | ICD-10-CM

## 2020-12-03 ENCOUNTER — Encounter: Payer: Self-pay | Admitting: Family Medicine

## 2020-12-03 ENCOUNTER — Telehealth: Payer: Self-pay | Admitting: *Deleted

## 2020-12-03 ENCOUNTER — Ambulatory Visit: Payer: Medicaid Other | Admitting: Family Medicine

## 2020-12-03 ENCOUNTER — Other Ambulatory Visit: Payer: Self-pay

## 2020-12-03 VITALS — BP 114/80 | HR 85 | Temp 97.7°F | Ht 69.0 in | Wt 396.0 lb

## 2020-12-03 DIAGNOSIS — I2510 Atherosclerotic heart disease of native coronary artery without angina pectoris: Secondary | ICD-10-CM | POA: Diagnosis not present

## 2020-12-03 DIAGNOSIS — M5441 Lumbago with sciatica, right side: Secondary | ICD-10-CM | POA: Diagnosis not present

## 2020-12-03 DIAGNOSIS — D72829 Elevated white blood cell count, unspecified: Secondary | ICD-10-CM

## 2020-12-03 DIAGNOSIS — N182 Chronic kidney disease, stage 2 (mild): Secondary | ICD-10-CM | POA: Diagnosis not present

## 2020-12-03 DIAGNOSIS — E1159 Type 2 diabetes mellitus with other circulatory complications: Secondary | ICD-10-CM

## 2020-12-03 DIAGNOSIS — I152 Hypertension secondary to endocrine disorders: Secondary | ICD-10-CM | POA: Diagnosis not present

## 2020-12-03 DIAGNOSIS — E785 Hyperlipidemia, unspecified: Secondary | ICD-10-CM | POA: Diagnosis not present

## 2020-12-03 DIAGNOSIS — M5442 Lumbago with sciatica, left side: Secondary | ICD-10-CM | POA: Diagnosis not present

## 2020-12-03 DIAGNOSIS — E1165 Type 2 diabetes mellitus with hyperglycemia: Secondary | ICD-10-CM

## 2020-12-03 DIAGNOSIS — G8929 Other chronic pain: Secondary | ICD-10-CM | POA: Diagnosis not present

## 2020-12-03 DIAGNOSIS — E1169 Type 2 diabetes mellitus with other specified complication: Secondary | ICD-10-CM | POA: Diagnosis not present

## 2020-12-03 DIAGNOSIS — E1122 Type 2 diabetes mellitus with diabetic chronic kidney disease: Secondary | ICD-10-CM | POA: Diagnosis not present

## 2020-12-03 LAB — BAYER DCA HB A1C WAIVED: HB A1C (BAYER DCA - WAIVED): 7.5 % — ABNORMAL HIGH (ref <7.0)

## 2020-12-03 MED ORDER — TRAMADOL HCL 50 MG PO TABS: 50.0000 mg | ORAL_TABLET | Freq: Two times a day (BID) | ORAL | 1 refills | Status: DC | PRN

## 2020-12-03 MED ORDER — LIDOCAINE 5 % EX PTCH: 1.0000 | MEDICATED_PATCH | CUTANEOUS | 99 refills | Status: DC

## 2020-12-03 NOTE — Progress Notes (Signed)
Subjective: CC: Chronic back pain, type 2 diabetes PCP: Katelyn Norlander, DO [name] is a 40 y.o. female presenting to clinic today for:  1.  Chronic back pain Patient with ongoing chronic and debilitating back pain.  She is status post physical therapy and this did seem to improve her range of motion but she [name] has quite a bit of difficulty getting up and around.  She admits that she has been much more sedentary as a result.  She did have some joint swelling that lasted about a week after starting the Biaxin with amoxicillin combo for her H. pylori.  She discontinued it for a bit and then resumed it.  She [name] has a few more days to complete.  She uses her tramadol on essentially a daily basis but typically only at nighttime because she does not want to be sedated during the daytime.  2. Type 2 Diabetes with hypertension, hyperlipidemia/CAD:  Patient notes that she does not eat many carbs and in fact only eats 1 meal per day.  She does not really exercise at all.  She is compliant with her Victoza, Metformin, losartan, rosuvastatin, TriCor and Vascepa.  No reports of chest pain.  Her dyspnea on exertion seems to be getting better.  No falls  Last eye exam: Needs eye exam Last foot exam: Up-to-date Last A1c:  Lab Results  Component Value Date   HGBA1C 7.5 (H) 12/03/2020   Nephropathy screen indicated?:  On ARB Last flu, zoster and/or pneumovax:  Immunization History  Administered Date(s) Administered  . Influenza Split 10/10/2011  . Influenza,inj,Quad PF,6+ Mos 10/28/2017, 06/26/2018, 06/05/2019, 08/19/2020  . Influenza-Unspecified 06/26/2018, 06/05/2019  . Pneumococcal Conjugate-13 10/28/2017  . Pneumococcal Polysaccharide-23 10/10/2011  . Tdap 01/25/2018    ROS: Per HPI  Allergies  Allergen Reactions  . Bee Venom Anaphylaxis and Swelling  . Latex Itching  . Sulfa Drugs Cross Reactors Nausea And Vomiting   Past Medical History:  Diagnosis Date  .  Allergy   . Anemia   . Anginal pain ([place])   . Anxiety   . CHF (congestive heart failure) ([place])   . Chronic kidney disease    history of kidney stones  . Coronary artery disease 09/2011   a.s/p BMS to LAD and angioplasty of RI in 09/2011 b. patent stent by cath in 02/2012, 12/2013, and 02/2016 with most recent showing 30-40% dLAD and 30-40% RCA stenosis  . Depression   . Diabetes mellitus   . Dyspnea   . Hyperlipidemia   . Hypertension   . Midsternal chest pain 03/03/2012  . Migraines   . Morbid obesity ([place])   . Myocardial infarct ([place]) 09/2011  . Myocardial infarction ([place]) 02/2016  . Neuromuscular disorder ([place])   . Palpitations 02/17/2012  . Ureteral stone with hydronephrosis   . Urinary tract obstruction due to kidney stone 02/15/2017    Current Outpatient Medications:  .  ACCU-CHEK AVIVA PLUS test strip, USE TO TEST BLOOD SUGAR FOUR TIMES DAILY, Disp: 400 strip, Rfl: 3 .  Accu-Chek Softclix Lancets lancets, Check BS four times daily Dx E11.9, Disp: 100 each, Rfl: 11 .  amoxicillin (AMOXIL) 500 MG capsule, Take 1,000 mg by mouth 2 (two) times daily., Disp: , Rfl:  .  aspirin 81 MG chewable tablet, Chew 81 mg by mouth daily., Disp: , Rfl:  .  blood glucose meter kit and supplies, Dispense based on patient and insurance preference. Use up to four times daily as directed. (FOR ICD-10:  E11.22). Check BG daily, Disp: 1 each, Rfl: 0 .  busPIRone (BUSPAR) 10 MG tablet, Take 1 tablet (10 mg total) by mouth 2 (two) times daily., Disp: 60 tablet, Rfl: 12 .  carvedilol (COREG) 25 MG tablet, Take 1 tablet (25 mg total) by mouth 2 (two) times daily with a meal. TAKE 1 TABLET BY MOUTH TWICE DAILY WITH A MEAL, Disp: 180 tablet, Rfl: 1 .  clarithromycin (BIAXIN) 500 MG tablet, Take 500 mg by mouth 2 (two) times daily., Disp: , Rfl:  .  clopidogrel (PLAVIX) 75 MG tablet, take 300 mg (4 tablets) on Day 1, then take 75 mg (1 tablet) daily. (Patient taking differently: Take 75 mg by mouth daily.),  Disp: 94 tablet, Rfl: 3 .  colestipol (COLESTID) 1 g tablet, Take 2 tablets (2 g total) by mouth daily. Do not take within 4 hours of other medications., Disp: 60 tablet, Rfl: 3 .  fenofibrate (TRICOR) 48 MG tablet, Take 1 tablet (48 mg total) by mouth daily., Disp: 90 tablet, Rfl: 3 .  FLUoxetine (PROZAC) 20 MG capsule, TAKE THREE CAPSULES BY MOUTH DAILY, Disp: 90 capsule, Rfl: 0 .  icosapent Ethyl (VASCEPA) 1 g capsule, Take 2 capsules (2 g total) by mouth 2 (two) times daily., Disp: 120 capsule, Rfl: 11 .  Insulin Pen Needle (GLOBAL EASE INJECT PEN NEEDLES) 31G X 5 MM MISC, USE ONCE EVERY DAY, Disp: 100 each, Rfl: 3 .  losartan (COZAAR) 25 MG tablet, Take 1 tablet (25 mg total) by mouth daily., Disp: 90 tablet, Rfl: 3 .  medroxyPROGESTERone (PROVERA) 5 MG tablet, TAKE 1 TABLET BY MOUTH DAILY (Patient taking differently: Take 5 mg by mouth daily.), Disp: 90 tablet, Rfl: 0 .  metFORMIN (GLUCOPHAGE) 1000 MG tablet, TAKE 1 TABLET BY MOUTH TWICE DAILY, Disp: 60 tablet, Rfl: 0 .  nitroGLYCERIN (NITROSTAT) 0.4 MG SL tablet, DISSOLVE 1 TABLET UNDER THE TONGUE EVERY 5 MINUTES AS NEEDED FOR CHEST PAIN. DO NOT EXCEED A TOTAL OF 3 DOSES IN 15 MINUTES. (Patient taking differently: Place 0.4 mg under the tongue every 5 (five) minutes as needed for chest pain.), Disp: 25 tablet, Rfl: 3 .  pantoprazole (PROTONIX) 40 MG tablet, TAKE 1 TABLET BY MOUTH DAILY, Disp: 30 tablet, Rfl: 0 .  potassium chloride SA (KLOR-CON) 20 MEQ tablet, TAKE 1 TABLET BY MOUTH EVERY DAY (Patient taking differently: Take 20 mEq by mouth daily as needed (when taking Torsemide).), Disp: 90 tablet, Rfl: 3 .  pregabalin (LYRICA) 100 MG capsule, Take 1 capsule (100 mg total) by mouth 2 (two) times daily., Disp: 60 capsule, Rfl: 5 .  ranolazine (RANEXA) 1000 MG SR tablet, Take 1 tablet (1,000 mg total) by mouth 2 (two) times daily., Disp: 180 tablet, Rfl: 3 .  rosuvastatin (CRESTOR) 40 MG tablet, Take 1 tablet (40 mg total) by mouth daily., Disp:  30 tablet, Rfl: 6 .  torsemide (DEMADEX) 20 MG tablet, Take 2 tablets (40 mg total) by mouth daily as needed (swelling)., Disp: 180 tablet, Rfl: 1 .  traMADol (ULTRAM) 50 MG tablet, Take 1-2 tablets (50-100 mg total) by mouth every 12 (twelve) hours as needed for severe pain (ONLY IF NEEDED)., Disp: 60 tablet, Rfl: 1 .  VICTOZA 18 MG/3ML SOPN, INJECT 0.2 ML (1.2 MG) into THE SKIN DAILY, Disp: 6 mL, Rfl: 0 .  amoxicillin-clarithromycin-lansoprazole (PREVPAC) combo pack, Take by mouth 2 (two) times daily for 14 days. Follow package directions. Hold PPI while on medication., Disp: 112 each, Rfl: 0 Social History  Socioeconomic History  . Marital status: Married    Spouse name: [name]  . Number of children: 2  . Years of education: 10  . Highest education level: Not on file  Occupational History  . Not on file  Tobacco Use  . Smoking status: Current Every Day Smoker    Packs/day: 0.50    Years: 20.00    Pack years: 10.00    Types: Cigarettes    Start date: 03/18/1990  . Smokeless tobacco: Never Used  Vaping Use  . Vaping Use: Former  Substance and Sexual Activity  . Alcohol use: Yes    Alcohol/week: 0.0 standard drinks    Comment: maybe once a year  . Drug use: No  . Sexual activity: Yes    Birth control/protection: None    Comment: PCOS  Other Topics Concern  . Not on file  Social History Narrative   Unemployed   Applied for disability   Leisure "take care of house and kids"   Walks when able   Social Determinants of Health   Financial Resource Strain: Not on file  Food Insecurity: Not on file  Transportation Needs: Not on file  Physical Activity: Not on file  Stress: Not on file  Social Connections: Not on file  Intimate Partner Violence: Not on file   Family History  Problem Relation Age of Onset  . Heart murmur Father   . Diabetes Father   . Hypertension Father   . Hyperlipidemia Father   . Cancer Father        throat  . Diabetes Mother   . Cancer Mother         breast.uterine, ovarian  . Asthma Son   . Appendicitis Son   . Hernia Son   . Mental illness Son        Bipolar, personality d/o  . Stroke Maternal Grandmother   . Cancer Maternal Grandmother        breast  . Cancer Paternal Grandfather        lung  . Colon cancer Neg Hx     Objective: Office vital signs reviewed. BP 114/80   Pulse 85   Temp 97.7 F (36.5 C) (Temporal)   Ht _0  (1.753 m)   Wt (!) 396 lb (179.6 kg)   SpO2 98%   BMI 58.48 kg/m   Physical Examination:  General: Awake, alert, morbidly obese, No acute distress HEENT: Normal; sclera white Cardio: regular rate and rhythm, S1S2 heard, no murmurs appreciated Pulm: clear to auscultation bilaterally, no wheezes, rhonchi or rales; normal work of breathing on room air Extremities: warm, well perfused, No edema, cyanosis or clubbing; +2 pulses bilaterally MSK: Wide-based, antalgic gait and hunched station; ambulating independently  Assessment/ Plan: 40 y.o. female   Uncontrolled type 2 diabetes mellitus with hyperglycemia ([place]) - Plan: Bayer DCA Hb A1c Waived  Hyperlipidemia associated with type 2 diabetes mellitus ([place])  Hypertension associated with diabetes ([place]) - Plan: Basic Metabolic Panel, Magnesium  Coronary artery disease involving native coronary artery of native heart without angina pectoris - Plan: lidocaine (LIDODERM) 5 %  Chronic bilateral low back pain with bilateral sciatica - Plan: Drug Screen 10 W/Conf, Se, lidocaine (LIDODERM) 5 %, traMADol (ULTRAM) 50 MG tablet  Leukocytosis, unspecified type - Plan: CBC with Differential  Diabetes is uncontrolled now with A1c up to 7.5.  No recent steroids to explain.  I suspect that this is largely due to lack of physical activity.  I reinforced need to stay as  mobile as possible.  It is quite confounding that she is only consuming 1 meal per day yet [place] struggles tremendously with weight loss.  She is not on any insulin type medications so I am not  quite sure what the etiology of this ongoing morbid obesity is.  We discussed planning small frequent meals to improve metabolism.  Increase physical exercise is a must even if it is only a few walks per day.  We will need to consider adjusting medications at next visit if A1c remains above goal  Continue statin, Vascepa  Blood pressure is at goal.  Continue current regimen  Chronic back pain with intermittent exacerbation.  Continue tramadol as needed.  Drug screen obtained.  Lidocaine patches provided.  Check CBC  Orders Placed This Encounter  Procedures  . Drug Screen 10 W/Conf, Se  . Bayer DCA Hb A1c Waived  . Basic Metabolic Panel  . Magnesium  . CBC with Differential   No orders of the defined types were placed in this encounter.    Katelyn Norlander, DO [place] [phone]

## 2020-12-03 NOTE — Telephone Encounter (Signed)
PA in process/ww   Key: [id number] - PA Case ID: [id number] - Rx #: [id number] Need help? Call us at [phone] Status Sent to [facility] Lidocaine 5% patches

## 2020-12-04 NOTE — Telephone Encounter (Signed)
Denied on March 9

## 2020-12-06 LAB — DRUG SCREEN 10 W/CONF, SERUM
Amphetamines, IA: NEGATIVE ng/mL
Barbiturates, IA: NEGATIVE ug/mL
Benzodiazepines, IA: NEGATIVE ng/mL
Cocaine & Metabolite, IA: NEGATIVE ng/mL
Methadone, IA: NEGATIVE ng/mL
Opiates, IA: NEGATIVE ng/mL
Oxycodones, IA: NEGATIVE ng/mL
Phencyclidine, IA: NEGATIVE ng/mL
Propoxyphene, IA: NEGATIVE ng/mL
THC(Marijuana) Metabolite, IA: NEGATIVE ng/mL

## 2020-12-06 LAB — CBC WITH DIFFERENTIAL/PLATELET
Basophils Absolute: 0 10*3/uL (ref 0.0–0.2)
Basos: 0 %
EOS (ABSOLUTE): 0.2 10*3/uL (ref 0.0–0.4)
Eos: 2 %
Hematocrit: 42.9 % (ref 34.0–46.6)
Hemoglobin: 14.6 g/dL (ref 11.1–15.9)
Immature Grans (Abs): 0.1 10*3/uL (ref 0.0–0.1)
Immature Granulocytes: 1 %
Lymphocytes Absolute: 2.7 10*3/uL (ref 0.7–3.1)
Lymphs: 25 %
MCH: 30.3 pg (ref 26.6–33.0)
MCHC: 34 g/dL (ref 31.5–35.7)
MCV: 89 fL (ref 79–97)
Monocytes Absolute: 0.7 10*3/uL (ref 0.1–0.9)
Monocytes: 6 %
Neutrophils Absolute: 7.3 10*3/uL — ABNORMAL HIGH (ref 1.4–7.0)
Neutrophils: 66 %
Platelets: 280 10*3/uL (ref 150–450)
RBC: 4.82 x10E6/uL (ref 3.77–5.28)
RDW: 12.8 % (ref 11.7–15.4)
WBC: 10.9 10*3/uL — ABNORMAL HIGH (ref 3.4–10.8)

## 2020-12-06 LAB — BASIC METABOLIC PANEL
BUN/Creatinine Ratio: 15 (ref 9–23)
BUN: 12 mg/dL (ref 6–24)
CO2: 21 mmol/L (ref 20–29)
Calcium: 9.5 mg/dL (ref 8.7–10.2)
Chloride: 97 mmol/L (ref 96–106)
Creatinine, Ser: 0.82 mg/dL (ref 0.57–1.00)
Glucose: 184 mg/dL — ABNORMAL HIGH (ref 65–99)
Potassium: 4 mmol/L (ref 3.5–5.2)
Sodium: 136 mmol/L (ref 134–144)
eGFR: 92 mL/min/{1.73_m2} (ref >59)

## 2020-12-06 LAB — MAGNESIUM: Magnesium: 1.5 mg/dL — ABNORMAL LOW (ref 1.6–2.3)

## 2020-12-09 ENCOUNTER — Ambulatory Visit: Payer: Medicaid Other | Admitting: Gastroenterology

## 2020-12-10 ENCOUNTER — Telehealth (INDEPENDENT_AMBULATORY_CARE_PROVIDER_SITE_OTHER): Payer: Medicaid Other | Admitting: Licensed Clinical Social Worker

## 2020-12-10 DIAGNOSIS — F411 Generalized anxiety disorder: Secondary | ICD-10-CM

## 2020-12-10 NOTE — BH Specialist Note (Signed)
Virtual Behavioral Health Treatment Plan Team Note  MRN: [id number] NAME: [name]  DATE: 12/10/20  Start time:  3p End time:  315p Total time:  15 min  Total number of Virtual [facility] Treatment Team Plan encounters: 1/4  Treatment Team Attendees: Royal Piedra, LCSW & Dr. Modesta Messing, Psychiatrist  Diagnoses: No diagnosis found.  Goals, Interventions and Follow-up Plan Goals: Increase healthy adjustment to current life circumstances Interventions: Motivational Interviewing Solution-Focused Strategies Medication Management Recommendations: find out her adherence to her medication; will review next time Follow-up Plan: Continue VBH services   Psychiatric History  Depression: Yes Anxiety: Yes Mania: No Psychosis: No PTSD symptoms: Yes  Past Psychiatric History/Hospitalization(s): Hospitalization for psychiatric illness: No Prior Suicide Attempts: No Prior Self-injurious behavior: No  Psychosocial stressors  driving  Self-harm Behaviors Risk Assessment    Screenings PHQ-9 Assessments:  Depression screen [facility] 2/9 12/03/2020 10/01/2020 08/19/2020  Decreased Interest 0 0 0  Down, Depressed, Hopeless 0 1 0  PHQ - 2 Score 0 1 0  Altered sleeping 0 - -  Tired, decreased energy 0 - -  Change in appetite 0 - -  Feeling bad or failure about yourself  0 - -  Trouble concentrating 0 - -  Moving slowly or fidgety/restless 0 - -  Suicidal thoughts 0 - -  PHQ-9 Score 0 - -  Difficult doing work/chores Not difficult at all - -  Some recent data might be hidden   GAD-7 Assessments:  GAD 7 : Generalized Anxiety Score 12/03/2020 10/29/2020 10/01/2020 08/19/2020  Nervous, Anxious, on Edge 1 1 3 1   Control/stop worrying 1 0 2 1  Worry too much - different things 1 1 3 3   Trouble relaxing 0 3 3 3   Restless 0 0 2 2  Easily annoyed or irritable 1 1 1 2   Afraid - awful might happen 0 0 0 0  Total GAD 7 Score 4 6 14 12   Anxiety Difficulty Not difficult at all Somewhat difficult Very  difficult Somewhat difficult    Past Medical History Past Medical History:  Diagnosis Date   Allergy    Anemia    Anginal pain (HCC)    Anxiety    CHF (congestive heart failure) ([facility])    Chronic kidney disease    history of kidney stones   Coronary artery disease 09/2011   a.s/p BMS to LAD and angioplasty of RI in 09/2011 b. patent stent by cath in 02/2012, 12/2013, and 02/2016 with most recent showing 30-40% dLAD and 30-40% RCA stenosis   Depression    Diabetes mellitus    Dyspnea    Hyperlipidemia    Hypertension    Midsternal chest pain 03/03/2012   Migraines    Morbid obesity ([place])    Myocardial infarct ([place]) 09/2011   Myocardial infarction ([place]) 02/2016   Neuromuscular disorder ([place])    Palpitations 02/17/2012   Ureteral stone with hydronephrosis    Urinary tract obstruction due to kidney stone 02/15/2017    Vital signs: There were no vitals filed for this visit.  Allergies:  Allergies as of 12/10/2020 - Review Complete 12/03/2020  Allergen Reaction Noted   Bee venom Anaphylaxis and Swelling 06/23/2012   Latex Itching 08/14/2018   Sulfa drugs cross reactors Nausea And Vomiting 10/08/2011    Medication History Current medications:  Outpatient Encounter Medications as of 12/10/2020  Medication Sig   ACCU-CHEK AVIVA PLUS test strip USE TO TEST BLOOD SUGAR FOUR TIMES DAILY   Accu-Chek Softclix Lancets lancets Check BS  four times daily Dx E11.9   amoxicillin (AMOXIL) 500 MG capsule Take 1,000 mg by mouth 2 (two) times daily.   amoxicillin-clarithromycin-lansoprazole (PREVPAC) combo pack Take by mouth 2 (two) times daily for 14 days. Follow package directions. Hold PPI while on medication.   aspirin 81 MG chewable tablet Chew 81 mg by mouth daily.   blood glucose meter kit and supplies Dispense based on patient and insurance preference. Use up to four times daily as directed. (FOR ICD-10:  E11.22). Check BG daily   busPIRone (BUSPAR) 10 MG tablet Take 1 tablet (10 mg  total) by mouth 2 (two) times daily.   carvedilol (COREG) 25 MG tablet Take 1 tablet (25 mg total) by mouth 2 (two) times daily with a meal. TAKE 1 TABLET BY MOUTH TWICE DAILY WITH A MEAL   clarithromycin (BIAXIN) 500 MG tablet Take 500 mg by mouth 2 (two) times daily.   clopidogrel (PLAVIX) 75 MG tablet take 300 mg (4 tablets) on Day 1, then take 75 mg (1 tablet) daily. (Patient taking differently: Take 75 mg by mouth daily.)   colestipol (COLESTID) 1 g tablet Take 2 tablets (2 g total) by mouth daily. Do not take within 4 hours of other medications.   fenofibrate (TRICOR) 48 MG tablet Take 1 tablet (48 mg total) by mouth daily.   FLUoxetine (PROZAC) 20 MG capsule TAKE THREE CAPSULES BY MOUTH DAILY   icosapent Ethyl (VASCEPA) 1 g capsule Take 2 capsules (2 g total) by mouth 2 (two) times daily.   Insulin Pen Needle (GLOBAL EASE INJECT PEN NEEDLES) 31G X 5 MM MISC USE ONCE EVERY DAY   lidocaine (LIDODERM) 5 % Place 1 patch onto the skin daily. Remove & Discard patch within 12 hours or as directed by MD. NOT a candidate for NSAIDs due to MI/ CAD.   losartan (COZAAR) 25 MG tablet Take 1 tablet (25 mg total) by mouth daily.   medroxyPROGESTERone (PROVERA) 5 MG tablet TAKE 1 TABLET BY MOUTH DAILY (Patient taking differently: Take 5 mg by mouth daily.)   metFORMIN (GLUCOPHAGE) 1000 MG tablet TAKE 1 TABLET BY MOUTH TWICE DAILY   nitroGLYCERIN (NITROSTAT) 0.4 MG SL tablet DISSOLVE 1 TABLET UNDER THE TONGUE EVERY 5 MINUTES AS NEEDED FOR CHEST PAIN. DO NOT EXCEED A TOTAL OF 3 DOSES IN 15 MINUTES. (Patient taking differently: Place 0.4 mg under the tongue every 5 (five) minutes as needed for chest pain.)   pantoprazole (PROTONIX) 40 MG tablet TAKE 1 TABLET BY MOUTH DAILY   potassium chloride SA (KLOR-CON) 20 MEQ tablet TAKE 1 TABLET BY MOUTH EVERY DAY (Patient taking differently: Take 20 mEq by mouth daily as needed (when taking Torsemide).)   pregabalin (LYRICA) 100 MG capsule Take 1 capsule (100 mg total) by  mouth 2 (two) times daily.   ranolazine (RANEXA) 1000 MG SR tablet Take 1 tablet (1,000 mg total) by mouth 2 (two) times daily.   rosuvastatin (CRESTOR) 40 MG tablet Take 1 tablet (40 mg total) by mouth daily.   torsemide (DEMADEX) 20 MG tablet Take 2 tablets (40 mg total) by mouth daily as needed (swelling).   [START ON 12/18/2020] traMADol (ULTRAM) 50 MG tablet Take 1-2 tablets (50-100 mg total) by mouth every 12 (twelve) hours as needed for severe pain (ONLY IF NEEDED; place on hold).   VICTOZA 18 MG/3ML SOPN INJECT 0.2 ML (1.2 MG) into THE SKIN DAILY   No facility-administered encounter medications on file as of 12/10/2020.     Scribe for  Treatment Team: Lubertha [place], LCSW

## 2020-12-17 ENCOUNTER — Other Ambulatory Visit: Payer: Self-pay | Admitting: Internal Medicine

## 2020-12-17 ENCOUNTER — Other Ambulatory Visit: Payer: Self-pay | Admitting: Family Medicine

## 2020-12-17 ENCOUNTER — Other Ambulatory Visit: Payer: Self-pay | Admitting: Cardiology

## 2020-12-17 DIAGNOSIS — R110 Nausea: Secondary | ICD-10-CM

## 2020-12-17 DIAGNOSIS — R142 Eructation: Secondary | ICD-10-CM

## 2020-12-17 DIAGNOSIS — F411 Generalized anxiety disorder: Secondary | ICD-10-CM

## 2020-12-17 DIAGNOSIS — N182 Chronic kidney disease, stage 2 (mild): Secondary | ICD-10-CM

## 2020-12-17 DIAGNOSIS — E1122 Type 2 diabetes mellitus with diabetic chronic kidney disease: Secondary | ICD-10-CM

## 2020-12-22 ENCOUNTER — Encounter: Payer: Self-pay | Admitting: Family Medicine

## 2020-12-27 ENCOUNTER — Other Ambulatory Visit: Payer: Self-pay | Admitting: Family Medicine

## 2020-12-27 DIAGNOSIS — G8929 Other chronic pain: Secondary | ICD-10-CM

## 2020-12-27 DIAGNOSIS — M5442 Lumbago with sciatica, left side: Secondary | ICD-10-CM

## 2021-01-07 ENCOUNTER — Other Ambulatory Visit: Payer: Self-pay | Admitting: Family Medicine

## 2021-01-16 ENCOUNTER — Ambulatory Visit: Payer: Medicaid Other | Admitting: Gastroenterology

## 2021-01-27 ENCOUNTER — Other Ambulatory Visit: Payer: Self-pay | Admitting: Family Medicine

## 2021-01-27 ENCOUNTER — Other Ambulatory Visit: Payer: Self-pay | Admitting: Cardiology

## 2021-01-27 DIAGNOSIS — F411 Generalized anxiety disorder: Secondary | ICD-10-CM

## 2021-01-27 DIAGNOSIS — E1122 Type 2 diabetes mellitus with diabetic chronic kidney disease: Secondary | ICD-10-CM

## 2021-01-27 DIAGNOSIS — N182 Chronic kidney disease, stage 2 (mild): Secondary | ICD-10-CM

## 2021-02-06 ENCOUNTER — Other Ambulatory Visit: Payer: Self-pay | Admitting: Family Medicine

## 2021-02-06 DIAGNOSIS — G8929 Other chronic pain: Secondary | ICD-10-CM

## 2021-02-17 ENCOUNTER — Other Ambulatory Visit: Payer: Self-pay | Admitting: Family Medicine

## 2021-02-17 DIAGNOSIS — M5442 Lumbago with sciatica, left side: Secondary | ICD-10-CM

## 2021-02-17 DIAGNOSIS — G8929 Other chronic pain: Secondary | ICD-10-CM

## 2021-02-19 ENCOUNTER — Encounter: Payer: Self-pay | Admitting: Cardiology

## 2021-02-19 ENCOUNTER — Ambulatory Visit (INDEPENDENT_AMBULATORY_CARE_PROVIDER_SITE_OTHER): Payer: Medicaid Other | Admitting: Cardiology

## 2021-02-19 VITALS — BP 130/70 | HR 90 | Ht 70.0 in | Wt >= 6400 oz

## 2021-02-19 DIAGNOSIS — E782 Mixed hyperlipidemia: Secondary | ICD-10-CM

## 2021-02-19 DIAGNOSIS — I10 Essential (primary) hypertension: Secondary | ICD-10-CM | POA: Diagnosis not present

## 2021-02-19 DIAGNOSIS — I5032 Chronic diastolic (congestive) heart failure: Secondary | ICD-10-CM | POA: Diagnosis not present

## 2021-02-19 DIAGNOSIS — I2510 Atherosclerotic heart disease of native coronary artery without angina pectoris: Secondary | ICD-10-CM

## 2021-02-19 NOTE — Patient Instructions (Addendum)
Medication Instructions:  Continue all current medications.   Labwork: none  Testing/Procedures: none  Follow-Up: 6 months   Any Other Special Instructions Will Be Listed Below (If Applicable).   If you need a refill on your cardiac medications before your next appointment, please call your pharmacy.  

## 2021-02-19 NOTE — Progress Notes (Addendum)
Clinical Summary [name] is a 40 y.o.female seen today for follow up of the following medical problems.  1. CAD - prior anterior MI 09/2011, 95% lesion in mid LAD and ramus with 95% ostial stenosis. LVEF 55% by LV gram, apical hypokinesis. BMS to mid LAD and cutting balloon angioplasty of ramus.  - repeat cath 02/2012 with patent vessels and stent - 09/2011 Echo: LVEF 50% - 12/2013 Lexiscan large anteroseptal ischemia - cath 12/2013 with patent vessels - cath 02/2016 [facility] in setting of NSTEMI showed LM patent, LAD patent with patent stent, distal LAD 30-40%, LCX patent, RCA 30-40%. LVEDP 40, PCWP 30 mean PA 32, CI 4.93 - echo 02/2016 [place] LVEF 50%, apical akinesis    - 07/2018 cath mild to moderate disease other than small D1 99% too small for PCI. Normal LVEDP - we tried imdur 21m daily. Reported some episodes of shaking on this medication. Changed to ranexa   12/2019 admitted with NSTEMI - cath with LM patent, ostial LAD 40%, D1 99%, ramus ostial 90%, LCX patent, RCA prox 50%. Received DES to ramus  - no recent chest pain. Some SOB at times. No recent LE edema. Geiselman smoking, some weight gain.  - compliant with meds     2. Chronic diastolic HF - taking torsemide - no recent edema - most recent labs [place] Cr and K  3. HTN -she is compliant with meds  4. Hyperlipidemia  12/2019 TC 107 TG 105 HDL 27 LDL 59 Jan 2022 TC 160 TG 157 HDL 35 LDL 94 - compliant with meds     Past Medical History:  Diagnosis Date  . Allergy   . Anemia   . [place]inal pain ([place]   . Anxiety   . CHF (congestive heart failure) ([place]   . Chronic kidney disease    history of kidney stones  . Coronary artery disease 09/2011   a.s/p BMS to LAD and angioplasty of RI in 09/2011 b. patent stent by cath in 02/2012, 12/2013, and 02/2016 with most recent showing 30-40% dLAD and 30-40% RCA stenosis  . Depression   . Diabetes mellitus   . Dyspnea   . Hyperlipidemia   .  Hypertension   . Midsternal chest pain 03/03/2012  . Migraines   . Morbid obesity ([place]   . Myocardial infarct ([place] 09/2011  . Myocardial infarction ([place] 02/2016  . Neuromuscular disorder ([place]   . Palpitations 02/17/2012  . Ureteral stone with hydronephrosis   . Urinary tract obstruction due to kidney stone 02/15/2017     Allergies  Allergen Reactions  . Bee Venom Anaphylaxis and Swelling  . Latex Itching  . Sulfa Drugs Cross Reactors Nausea And Vomiting     Current Outpatient Medications  Medication Sig Dispense Refill  . ACCU-CHEK AVIVA PLUS test strip USE TO TEST BLOOD SUGAR FOUR TIMES DAILY 400 strip 3  . Accu-Chek Softclix Lancets lancets Check BS four times daily Dx E11.9 100 each 11  . amoxicillin (AMOXIL) 500 MG capsule Take 1,000 mg by mouth 2 (two) times daily.    .Marland Kitchenamoxicillin-clarithromycin-lansoprazole (PREVPAC) combo pack Take by mouth 2 (two) times daily for 14 days. Follow package directions. Hold PPI while on medication. 112 each 0  . aspirin 81 MG chewable tablet Chew 81 mg by mouth daily.    . blood glucose meter kit and supplies Dispense based on patient and insurance preference. Use up to four times daily as directed. (FOR ICD-10:  E11.22). Check BG daily 1  each 0  . busPIRone (BUSPAR) 10 MG tablet Take 1 tablet (10 mg total) by mouth 2 (two) times daily. 60 tablet 12  . carvedilol (COREG) 25 MG tablet Take 1 tablet (25 mg total) by mouth 2 (two) times daily with a meal. TAKE 1 TABLET BY MOUTH TWICE DAILY WITH A MEAL 180 tablet 1  . clarithromycin (BIAXIN) 500 MG tablet Take 500 mg by mouth 2 (two) times daily.    . clopidogrel (PLAVIX) 75 MG tablet TAKE 1 TABLET BY MOUTH EVERY DAY 94 tablet 3  . colestipol (COLESTID) 1 g tablet Take 2 tablets (2 g total) by mouth daily. Do not take within 4 hours of other medications. 60 tablet 3  . fenofibrate (TRICOR) 48 MG tablet Take 1 tablet (48 mg total) by mouth daily. 90 tablet 3  . FLUoxetine (PROZAC) 20 MG capsule  TAKE THREE CAPSULES BY MOUTH DAILY 90 capsule 0  . Insulin Pen Needle (GLOBAL EASE INJECT PEN NEEDLES) 31G X 5 MM MISC USE ONCE EVERY DAY 100 each 3  . lidocaine (LIDODERM) 5 % Place 1 patch onto the skin daily. Remove & Discard patch within 12 hours or as directed by MD. NOT a candidate for NSAIDs due to MI/ CAD. 30 patch prn  . losartan (COZAAR) 25 MG tablet Take 1 tablet (25 mg total) by mouth daily. 90 tablet 3  . medroxyPROGESTERone (PROVERA) 5 MG tablet TAKE 1 TABLET BY MOUTH DAILY 90 tablet 0  . metFORMIN (GLUCOPHAGE) 1000 MG tablet TAKE 1 TABLET BY MOUTH TWICE DAILY 60 tablet 2  . nitroGLYCERIN (NITROSTAT) 0.4 MG SL tablet DISSOLVE 1 TABLET UNDER THE TONGUE EVERY 5 MINUTES AS NEEDED FOR CHEST PAIN. DO NOT EXCEED A TOTAL OF 3 DOSES IN 15 MINUTES. (Patient taking differently: Place 0.4 mg under the tongue every 5 (five) minutes as needed for chest pain.) 25 tablet 3  . pantoprazole (PROTONIX) 40 MG tablet TAKE 1 TABLET BY MOUTH DAILY 30 tablet 2  . potassium chloride SA (KLOR-CON) 20 MEQ tablet TAKE 1 TABLET BY MOUTH EVERY DAY (Patient taking differently: Take 20 mEq by mouth daily as needed (when taking Torsemide).) 90 tablet 3  . pregabalin (LYRICA) 100 MG capsule Take 1 capsule (100 mg total) by mouth 2 (two) times daily. 60 capsule 5  . ranolazine (RANEXA) 1000 MG SR tablet TAKE 1 TABLET BY MOUTH TWICE DAILY 180 tablet 3  . rosuvastatin (CRESTOR) 40 MG tablet Take 1 tablet (40 mg total) by mouth daily. 30 tablet 6  . torsemide (DEMADEX) 20 MG tablet TAKE TWO TABLETS BY MOUTH DAILY AS NEEDED FOR swelling 180 tablet 3  . traMADol (ULTRAM) 50 MG tablet Take 1-2 tablets (50-100 mg total) by mouth every 12 (twelve) hours as needed for severe pain (ONLY IF NEEDED; place on hold). 60 tablet 1  . VASCEPA 1 g capsule TAKE TWO CAPSULES BY MOUTH TWICE DAILY 120 capsule 11  . VICTOZA 18 MG/3ML SOPN INJECT 0.2 ML (1.2 MG) into THE SKIN DAILY 6 mL 0   No current facility-administered medications for  this visit.     Past Surgical History:  Procedure Laterality Date  . BIOPSY  10/27/2020   Procedure: BIOPSY;  Surgeon: Daneil Dolin, MD;  [facility];  Service: Endoscopy;;  . CARDIAC CATHETERIZATION  10/08/2011   LAD: 95% mid, Ramus: 95% ostial  . CARPAL TUNNEL RELEASE    . CESAREAN SECTION    . CHOLECYSTECTOMY    . CORONARY ANGIOPLASTY WITH STENT  PLACEMENT  10/08/2011   LAD: BMS, Ramus: cutting balloon angioplasty  . CORONARY STENT INTERVENTION N/A 01/14/2020   Procedure: CORONARY STENT INTERVENTION;  Surgeon: Leonie Man, MD;  Location: [place] [facility] LAB;  Service: Cardiovascular;  Laterality: N/A;  . CYSTOSCOPY WITH RETROGRADE PYELOGRAM, URETEROSCOPY AND STENT PLACEMENT Left 02/16/2017   Procedure: CYSTOSCOPY WITH RETROGRADE PYELOGRAM, URETEROSCOPY AND STENT PLACEMENT;  Surgeon: Kathie Rhodes, MD;  Location: [facility];  Service: Urology;  Laterality: Left;  . CYSTOSCOPY WITH RETROGRADE PYELOGRAM, URETEROSCOPY AND STENT PLACEMENT Left 03/28/2017   Procedure: LEFT STENT REMOVAL LEFT RETROGRADE PYELOGRAM LEFT URETEROSCOPY   LASER LITHOTRIPSY  AND  STENT REPLACEMENT;  Surgeon: Cleon Gustin, MD;  Location: [facility];  Service: Urology;  Laterality: Left;  . ESOPHAGOGASTRODUODENOSCOPY (EGD) WITH PROPOFOL N/A 10/27/2020   Procedure: ESOPHAGOGASTRODUODENOSCOPY (EGD) WITH PROPOFOL;  Surgeon: Daneil Dolin, MD;  [facility];  Service: Endoscopy;  Laterality: N/A;  10:30am  . LEFT HEART CATH AND CORONARY ANGIOGRAPHY N/A 08/15/2018   Procedure: LEFT HEART CATH AND CORONARY ANGIOGRAPHY;  Surgeon: Martinique, Peter M, MD;  Location: [place] [facility];  Service: Cardiovascular;  Laterality: N/A;  . LEFT HEART CATH AND CORONARY ANGIOGRAPHY N/A 01/14/2020   Procedure: LEFT HEART CATH AND CORONARY ANGIOGRAPHY;  Surgeon: Leonie Man, MD;  Location: [place] [facility];  Service: Cardiovascular;  Laterality: N/A;  . LEFT HEART CATHETERIZATION WITH CORONARY ANGIOGRAM N/A  10/08/2011   Procedure: LEFT HEART CATHETERIZATION WITH CORONARY ANGIOGRAM;  Surgeon: Peter M Martinique, MD;  Location: [facility] C[facility] LAB;  Service: Cardiovascular;  Laterality: N/A;  . LEFT HEART CATHETERIZATION WITH CORONARY ANGIOGRAM N/A 03/02/2012   Procedure: LEFT HEART CATHETERIZATION WITH CORONARY ANGIOGRAM;  Surgeon: Burnell Blanks, MD;  Location: [facility] CATH LAB;  Service: Cardiovascular;  Laterality: N/A;  . LEFT HEART CATHETERIZATION WITH CORONARY ANGIOGRAM N/A 01/04/2014   Procedure: LEFT HEART CATHETERIZATION WITH CORONARY ANGIOGRAM;  Surgeon: Troy Sine, MD;  Location: [facility] CATH LAB;  Service: Cardiovascular;  Laterality: N/A;  . right hand pinky surgery       Allergies  Allergen Reactions  . Bee Venom Anaphylaxis and Swelling  . Latex Itching  . Sulfa Drugs Cross Reactors Nausea And Vomiting      Family History  Problem Relation Age of Onset  . Heart murmur Father   . Diabetes Father   . Hypertension Father   . Hyperlipidemia Father   . Cancer Father        throat  . Diabetes Mother   . Cancer Mother        breast.uterine, ovarian  . Asthma Son   . Appendicitis Son   . Hernia Son   . Mental illness Son        Bipolar, personality d/o  . Stroke Maternal Grandmother   . Cancer Maternal Grandmother        breast  . Cancer Paternal Grandfather        lung  . Colon cancer Neg Hx      Social History Ms. Hechavarria reports that she has been smoking cigarettes. She started smoking about 30 years ago. She has a 10.00 pack-year smoking history. She has never used smokeless tobacco. Ms. Shutter reports current alcohol use.   Review of Systems CONSTITUTIONAL: No weight loss, fever, chills, weakness or fatigue.  HEENT: Eyes: No visual loss, blurred vision, double vision or yellow sclerae.No hearing loss, sneezing, congestion, runny nose or sore throat.  SKIN: No rash or itching.  CARDIOVASCULAR: per  hpi RESPIRATORY: No shortness of breath, cough or sputum.   GASTROINTESTINAL: No anorexia, nausea, vomiting or diarrhea. No abdominal pain or blood.  GENITOURINARY: No burning on urination, no polyuria NEUROLOGICAL: No headache, dizziness, syncope, paralysis, ataxia, numbness or tingling in the extremities. No change in bowel or bladder control.  MUSCULOSKELETAL: No muscle, back pain, joint pain or stiffness.  LYMPHATICS: No enlarged nodes. No history of splenectomy.  PSYCHIATRIC: No history of depression or anxiety.  ENDOCRINOLOGIC: No reports of sweating, cold or heat intolerance. No polyuria or polydipsia.  Marland Kitchen   Physical Examination Today's Vitals   02/19/21 0917  BP: 130/70  Pulse: 90  SpO2: 99%  Weight: (!) 404 lb (183.3 kg)  Height: 5' 10" (1.778 m)   Body mass index is 57.97 kg/m.  Gen: resting comfortably, no acute distress HEENT: no scleral icterus, pupils equal round and reactive, no palptable cervical adenopathy,  CV: RRR, no m/r/g, no jvd Resp: Clear to auscultation bilaterally GI: abdomen is soft, non-tender, non-distended, normal bowel sounds, no hepatosplenomegaly MSK: extremities are warm, no edema.  Skin: warm, no rash Neuro:  no focal deficits Psych: appropriate affect   Diagnostic Studies 02/2012 Cath Hemodynamic Findings: Central aortic pressure: 108/70  Left ventricular pressure: 123/10/21  Angiographic Findings: Left main: No obstructive disease noted.  Left Anterior Descending Artery: Large caliber vessel that courses to the apex. There is a stent present in the mid vessel that is widely patent with no restenosis. The remainder of the LAD is disease free. There is a moderate sized diagonal branch with 30% proximal stenosis.  Circumflex Artery: Dominant, large caliber vessel with no disease throughout the AV groove segment. There is small to moderate sized intermediate branch that has ostial 30% stenosis. The remainder of the Circumflex has no obstructive disease.  Right Coronary Artery: Small,  non-dominant vessel. No disease noted.  Left Ventricular Angiogram: LVEF 50-55%.  Impression:  1. Double vessel CAD with patent stent mid LAD and patent angioplasty site at ostium of intermediate branch.  2. Normal LV systolic function   12/2681 Cath PROCEDURAL FINDINGS  Hemodynamics:  AO 135/92 with a mean of 114 mmHg  LV 136/25 mmHg  Coronary angiography:  Coronary dominance: Left  Left mainstem: Normal.  Left anterior descending (LAD): There is a 95% stenosis in the mid LAD immediately after the takeoff of the first diagonal. The first diagonal branch is moderate in size and appears normal. The LAD stenosis does appear to be hazy.  There is a moderately large ramus intermediate branch which has a 95% ostial stenosis.  Left circumflex (LCx): This is a dominant vessel and appears normal throughout.  Right coronary artery (RCA): This is a small nondominant vessel and is normal.  Left ventriculography: Left ventricular systolic function is abnormal with apical hypokinesis. LVEF is estimated at 55%, there is no significant mitral regurgitation  PCI Note: Following the diagnostic procedure, the decision was made to proceed with PCI of the LAD. Weight-based bivalirudin was given for anticoagulation. Once a therapeutic ACT was achieved, a 5 [place] EBU guide catheter was inserted. A pro-water coronary guidewire was used to cross the lesion. The lesion was predilated with a 2.5 mm balloon. The lesion was then stented with a 3.0 x 18 mm vision stent. The stent was postdilated with a 3.25 mm noncompliant balloon. Following PCI, there was 0% residual stenosis and TIMI-3 flow. At this point the patient's pain was improved but was Husmann of moderate intensity. We then proceeded to intervene on the  ostial ramus intermediate lesion. This was crossed with the prowater wire. This was dilated using a 3.0 x 10 mm cutting balloon performing 3 inflations to 6 atmospheres. This showed a good  angiographic result with less than 30% residual stenosis and TIMI grade 3 flow. Final angiography confirmed an excellent result. The patient tolerated the procedure well. There were no immediate procedural complications. A TR band was used for radial hemostasis. The patient was transferred to the post catheterization recovery area for further monitoring.  PCI Data:  Vessel - LAD/Segment - mid vessel immediately after the takeoff of the first diagonal.  Percent Stenosis (pre) 95%  TIMI-flow 3.  Stent 3.0 x 18 mm vision stent.  Percent Stenosis (post) 0%  TIMI-flow (post) 3.  Second vessel: Ostial ramus intermediate branch  Percent stenosis: 95%.  TIMI flow 3.  3.0 mm cutting balloon  Percent stenosis (post) less than 30%  TIMI flow 3  Final Conclusions:  1. Severe 2 vessel obstructive coronary disease.  2. Overall well preserved LV systolic function with apical wall motion abnormality and ejection fraction of 55%.  3. Successful intracoronary stenting of the mid LAD with a bare-metal stent.  4. Successful cutting balloon angioplasty of the ramus intermediate branch.  Recommendations:  Continue therapy with Plavix. Patient reports a history of allergy to aspirin. Aggressive risk factor modification.  Disposition: Patient be observed in the intensive care unit tonight. If she has no complications she should be able to be transferred to telemetry in the morning.   12/2013 Cath HEMODYNAMICS:  Central Aorta: 100/60  Left Ventricle: 100/3  ANGIOGRAPHY:  1. Left main: Normal and trifurcated into an LAD, ramus intermediate vessel, and a dominant left circumflex coronary artery  2. LAD: Leandrew Koyanagi gave rise to a proximal large first diagonal vessel. The LAD just after the diagonal vessel had a widely patent stent. The remainder of the LAD was free of significant disease and extended to the LV apex. 3. Ramus Intermediate: No evidence for restenosis at previous site  of ostial cutting balloon intervention. 4. Left circumflex: Angiographically normal. Dominant vessel, which gave rise to 2 marginal branches and in the PDA.Marland Kitchen  4. Right coronary artery: Angiographically normal, nondominant vessel  Left ventriculography revealed global LV function. There was a suggestion of mild residual distal anterolateral hypocontractility. There was catheter-induced mitral regurgitation.  IMPRESSION:  Normal LV function with mild distal anterolateral, residual hypocontractility  No significant residual coronary obstructive disease with evidence for widely patent LAD stent, no evidence for restenosis of the ramus intermediate vessel, normal dominant left circumflex coronary artery and normal nondominant right coronary artery.    12/2013 MPI IMPRESSION: 1. Abnormal Lexiscan for ischemia  2. Large anteroseptal and apical area of ischemia  3. High risk study for major cardiac events based on area of myocardium at [facility]  4. Normal left ventricular systolic function, left ventricular ejection fraction 52%   12/2019 cath  A drug-eluting stent was successfully placed using a STENT RESOLUTE ONYX 2.5X30. Postdilated to 2.7 mm  Post intervention, there is a 0% residual stenosis throughout the entire segment.Marland Kitchen  ------------  Colon Flattery LAD lesion is 40% stenosed. Prox LAD previously placed BMS stent is 30% stenosed. Prox LAD to Mid LAD lesion is 20% stenosed.  Ost 1st Diag lesion is 99% stenosed. Known from prior catheterizations  Small, nondominant RCA: Prox RCA to Dist RCA lesion is 50% stenosed.  --------------  The left ventricular systolic function is normal. The left ventricular ejection fraction is 55-65% by visual  estimate. LV end diastolic pressure is normal.   CULPRIT LESION: Ostial and proximal RAMUS INTERMEDIUS (RI) 95-70%   Successful DES PCI of RI (RESOLUTE ONYX DES 2.5 mm x 30 mm--2.7 mm)  Otherwise moderate diffuse disease: Diffuse  moderate LAD 20-30% stenosis and known 99% subtotal occlusion of the very small caliber 1st Diag  Preserved LVEF and normal EDP.     Assessment and Plan  1. CAD - due to disease burden have elected for extended plavix use - no recent symptoms, continue current meds - EKG today shows NSR, no ischemic changes   2.Chronic diastolic HF - appears euvolemic, weight gain appears to be more related to calorie related gains - continue diuretic  3. HTN - at goal, continue current meds  4. Hyperlipidemia - LDL has been trending back up, f/u upcoming labs w/ pcp. Depending on LDL would consider adding zetia vs repatha.      [name], M.D.

## 2021-02-26 ENCOUNTER — Other Ambulatory Visit: Payer: Self-pay | Admitting: Cardiology

## 2021-03-02 ENCOUNTER — Other Ambulatory Visit: Payer: Self-pay | Admitting: Family Medicine

## 2021-03-02 DIAGNOSIS — N182 Chronic kidney disease, stage 2 (mild): Secondary | ICD-10-CM

## 2021-03-02 DIAGNOSIS — E1122 Type 2 diabetes mellitus with diabetic chronic kidney disease: Secondary | ICD-10-CM

## 2021-03-02 DIAGNOSIS — F411 Generalized anxiety disorder: Secondary | ICD-10-CM

## 2021-03-02 DIAGNOSIS — E1140 Type 2 diabetes mellitus with diabetic neuropathy, unspecified: Secondary | ICD-10-CM

## 2021-03-04 DIAGNOSIS — H5213 Myopia, bilateral: Secondary | ICD-10-CM | POA: Diagnosis not present

## 2021-03-04 LAB — HM DIABETES EYE EXAM

## 2021-03-06 ENCOUNTER — Encounter: Payer: Self-pay | Admitting: Family Medicine

## 2021-03-06 ENCOUNTER — Other Ambulatory Visit: Payer: Self-pay

## 2021-03-06 ENCOUNTER — Ambulatory Visit: Payer: Medicaid Other | Admitting: Family Medicine

## 2021-03-06 VITALS — BP 131/86 | HR 90 | Temp 97.0°F | Ht 70.0 in | Wt >= 6400 oz

## 2021-03-06 DIAGNOSIS — E1140 Type 2 diabetes mellitus with diabetic neuropathy, unspecified: Secondary | ICD-10-CM | POA: Diagnosis not present

## 2021-03-06 DIAGNOSIS — Z6841 Body Mass Index (BMI) 40.0 and over, adult: Secondary | ICD-10-CM

## 2021-03-06 DIAGNOSIS — M5441 Lumbago with sciatica, right side: Secondary | ICD-10-CM | POA: Diagnosis not present

## 2021-03-06 DIAGNOSIS — G8929 Other chronic pain: Secondary | ICD-10-CM | POA: Diagnosis not present

## 2021-03-06 DIAGNOSIS — E785 Hyperlipidemia, unspecified: Secondary | ICD-10-CM | POA: Diagnosis not present

## 2021-03-06 DIAGNOSIS — E1165 Type 2 diabetes mellitus with hyperglycemia: Secondary | ICD-10-CM | POA: Diagnosis not present

## 2021-03-06 DIAGNOSIS — E1169 Type 2 diabetes mellitus with other specified complication: Secondary | ICD-10-CM

## 2021-03-06 DIAGNOSIS — I5032 Chronic diastolic (congestive) heart failure: Secondary | ICD-10-CM

## 2021-03-06 DIAGNOSIS — M5442 Lumbago with sciatica, left side: Secondary | ICD-10-CM | POA: Diagnosis not present

## 2021-03-06 LAB — BAYER DCA HB A1C WAIVED: HB A1C (BAYER DCA - WAIVED): 8.8 % — ABNORMAL HIGH (ref <7.0)

## 2021-03-06 MED ORDER — ROSUVASTATIN CALCIUM 40 MG PO TABS: 40.0000 mg | ORAL_TABLET | Freq: Every day | ORAL | 6 refills | Status: DC

## 2021-03-06 MED ORDER — PREGABALIN 150 MG PO CAPS
150.0000 mg | ORAL_CAPSULE | Freq: Two times a day (BID) | ORAL | 3 refills | Status: DC
Start: 2021-03-06 — End: 2021-06-08

## 2021-03-06 MED ORDER — EMPAGLIFLOZIN 25 MG PO TABS
25.0000 mg | ORAL_TABLET | Freq: Every day | ORAL | 12 refills | Status: DC
Start: 2021-03-06 — End: 2021-08-26

## 2021-03-06 MED ORDER — METFORMIN HCL 1000 MG PO TABS
1.0000 | ORAL_TABLET | Freq: Two times a day (BID) | ORAL | 2 refills | Status: DC
Start: 2021-03-06 — End: 2021-06-29

## 2021-03-06 MED ORDER — FENOFIBRATE 48 MG PO TABS: 48.0000 mg | ORAL_TABLET | Freq: Every day | ORAL | 5 refills | Status: DC

## 2021-03-06 MED ORDER — JARDIANCE 10 MG PO TABS: 10.0000 mg | ORAL_TABLET | Freq: Every day | ORAL | 0 refills | Status: AC

## 2021-03-06 MED ORDER — TRAMADOL HCL 50 MG PO TABS: 50.0000 mg | ORAL_TABLET | Freq: Two times a day (BID) | ORAL | 1 refills | Status: DC | PRN

## 2021-03-06 NOTE — Patient Instructions (Signed)
London Pepper has been ordered to help with the sugar.  It is uncontrolled today with A1c rising to 8.8.  He will continue all other medications as prescribed.  The London Pepper has died in both diabetes and cardiovascular disease.  I will CC your chart today to Dr. Wyline Mood that he is aware that we have started this medicine.  Start with a 10 mg tablets once daily as prescribed.  Once you have finished the samples, you may transition over to 25 mg tablets daily which I have sent to your pharmacy.  Lyrica dose was increased to 150 mg twice daily.  I would like you to use only 100 mg in the morning and you can use the 150 mg in the evening for the next week then transition over to 150 mg twice daily.

## 2021-03-06 NOTE — Progress Notes (Signed)
Subjective: CC: DM PCP: Janora Norlander, DO ZSW:[id number] D Skluzacek is a 40 y.o. female presenting to clinic today for:  1. Type 2 Diabetes with hypertension, hyperlipidemia:  She reports that she has been under a lot more stress lately and feels that this is why her blood sugars uncontrolled.  She is compliant with her medications and denies having missed any doses.  No reports of polydipsia, polyuria, visual disturbance.  Last eye exam: Up-to-date Last foot exam: Up-to-date Last A1c:  Lab Results  Component Value Date   HGBA1C 7.5 (H) 12/03/2020   Nephropathy screen indicated?:  On ARB Last flu, zoster and/or pneumovax:  Immunization History  Administered Date(s) Administered   Influenza Split 10/10/2011   Influenza,inj,Quad PF,6+ Mos 10/28/2017, 06/26/2018, 06/05/2019, 08/19/2020   Influenza-Unspecified 06/26/2018, 06/05/2019   PFIZER(Purple Top)SARS-COV-2 Vaccination 03/27/2020   Pneumococcal Conjugate-13 10/28/2017   Pneumococcal Polysaccharide-23 10/10/2011   Tdap 01/25/2018    Continues to have difficulty with neuropathy and chronic low back pain.  She is compliant with her Lyrica 100 mg twice daily.  She takes tramadol as needed.  She admits that after sitting for a while her body seems to be more achy.  She in fact reports some hyperalgesia of the skin.   ROS: Per HPI  Allergies  Allergen Reactions   Bee Venom Anaphylaxis and Swelling   Latex Itching   Sulfa Drugs Cross Reactors Nausea And Vomiting   Past Medical History:  Diagnosis Date   Allergy    Anemia    Anginal pain (HCC)    Anxiety    CHF (congestive heart failure) (HCC)    Chronic kidney disease    history of kidney stones   Coronary artery disease 09/2011   a.s/p BMS to LAD and angioplasty of RI in 09/2011 b. patent stent by cath in 02/2012, 12/2013, and 02/2016 with most recent showing 30-40% dLAD and 30-40% RCA stenosis   Depression    Diabetes mellitus    Dyspnea    Hyperlipidemia     Hypertension    Midsternal chest pain 03/03/2012   Migraines    Morbid obesity ([place])    Myocardial infarct ([place]) 09/2011   Myocardial infarction ([place]) 02/2016   Neuromuscular disorder ([place])    Palpitations 02/17/2012   Ureteral stone with hydronephrosis    Urinary tract obstruction due to kidney stone 02/15/2017    Current Outpatient Medications:    ACCU-CHEK AVIVA PLUS test strip, USE TO TEST BLOOD SUGAR FOUR TIMES DAILY, Disp: 400 strip, Rfl: 3   Accu-Chek Softclix Lancets lancets, Check BS four times daily Dx E11.9, Disp: 100 each, Rfl: 11   aspirin 81 MG chewable tablet, Chew 81 mg by mouth daily., Disp: , Rfl:    blood glucose meter kit and supplies, Dispense based on patient and insurance preference. Use up to four times daily as directed. (FOR ICD-10:  E11.22). Check BG daily, Disp: 1 each, Rfl: 0   busPIRone (BUSPAR) 10 MG tablet, Take 1 tablet (10 mg total) by mouth 2 (two) times daily., Disp: 60 tablet, Rfl: 12   carvedilol (COREG) 25 MG tablet, Take 1 tablet (25 mg total) by mouth 2 (two) times daily with a meal. TAKE 1 TABLET BY MOUTH TWICE DAILY WITH A MEAL, Disp: 180 tablet, Rfl: 1   clopidogrel (PLAVIX) 75 MG tablet, TAKE 1 TABLET BY MOUTH EVERY DAY, Disp: 94 tablet, Rfl: 3   colestipol (COLESTID) 1 g tablet, Take 2 tablets (2 g total) by mouth daily. Do not  take within 4 hours of other medications., Disp: 60 tablet, Rfl: 3   fenofibrate (TRICOR) 48 MG tablet, Take 1 tablet (48 mg total) by mouth daily., Disp: 90 tablet, Rfl: 3   FLUoxetine (PROZAC) 20 MG capsule, TAKE THREE CAPSULES BY MOUTH DAILY, Disp: 90 capsule, Rfl: 0   Insulin Pen Needle (GLOBAL EASE INJECT PEN NEEDLES) 31G X 5 MM MISC, USE ONCE EVERY DAY, Disp: 100 each, Rfl: 3   lidocaine (LIDODERM) 5 %, Place 1 patch onto the skin daily. Remove & Discard patch within 12 hours or as directed by MD. NOT a candidate for NSAIDs due to MI/ CAD., Disp: 30 patch, Rfl: prn   losartan (COZAAR) 25 MG tablet, TAKE 1 TABLET BY MOUTH  DAILY, Disp: 90 tablet, Rfl: 1   medroxyPROGESTERone (PROVERA) 5 MG tablet, TAKE 1 TABLET BY MOUTH DAILY, Disp: 90 tablet, Rfl: 0   metFORMIN (GLUCOPHAGE) 1000 MG tablet, TAKE 1 TABLET BY MOUTH TWICE DAILY, Disp: 60 tablet, Rfl: 2   nitroGLYCERIN (NITROSTAT) 0.4 MG SL tablet, DISSOLVE 1 TABLET UNDER THE TONGUE EVERY 5 MINUTES AS NEEDED FOR CHEST PAIN. DO NOT EXCEED A TOTAL OF 3 DOSES IN 15 MINUTES. (Patient taking differently: Place 0.4 mg under the tongue every 5 (five) minutes as needed for chest pain.), Disp: 25 tablet, Rfl: 3   pantoprazole (PROTONIX) 40 MG tablet, TAKE 1 TABLET BY MOUTH DAILY, Disp: 30 tablet, Rfl: 2   potassium chloride SA (KLOR-CON) 20 MEQ tablet, TAKE 1 TABLET BY MOUTH EVERY DAY (Patient taking differently: Take 20 mEq by mouth daily as needed (when taking Torsemide).), Disp: 90 tablet, Rfl: 3   pregabalin (LYRICA) 100 MG capsule, Take 1 capsule (100 mg total) by mouth 2 (two) times daily., Disp: 60 capsule, Rfl: 5   ranolazine (RANEXA) 1000 MG SR tablet, TAKE 1 TABLET BY MOUTH TWICE DAILY, Disp: 180 tablet, Rfl: 3   rosuvastatin (CRESTOR) 40 MG tablet, Take 1 tablet (40 mg total) by mouth daily., Disp: 30 tablet, Rfl: 6   torsemide (DEMADEX) 20 MG tablet, TAKE TWO TABLETS BY MOUTH DAILY AS NEEDED FOR swelling, Disp: 180 tablet, Rfl: 3   traMADol (ULTRAM) 50 MG tablet, Take 1-2 tablets (50-100 mg total) by mouth every 12 (twelve) hours as needed for severe pain (ONLY IF NEEDED; place on hold)., Disp: 60 tablet, Rfl: 1   VASCEPA 1 g capsule, TAKE TWO CAPSULES BY MOUTH TWICE DAILY, Disp: 120 capsule, Rfl: 11   VICTOZA 18 MG/3ML SOPN, INJECT 1.2 MG into THE SKIN DAILY, Disp: 6 mL, Rfl: 0 Social History   Socioeconomic History   Marital status: Married    Spouse name: Barnabas Lister   Number of children: 2   Years of education: 10   Highest education level: Not on file  Occupational History   Not on file  Tobacco Use   Smoking status: Every Day    Packs/day: 0.50    Years: 20.00     Pack years: 10.00    Types: Cigarettes    Start date: 03/18/1990   Smokeless tobacco: Never  Vaping Use   Vaping Use: Former  Substance and Sexual Activity   Alcohol use: Not Currently    Alcohol/week: 0.0 standard drinks    Comment: maybe once a year   Drug use: No   Sexual activity: Yes    Birth control/protection: None    Comment: PCOS  Other Topics Concern   Not on file  Social History Narrative   Unemployed   Applied for disability  Leisure "take care of house and kids"   Walks when able   Social Determinants of Health   Financial Resource Strain: Not on file  Food Insecurity: Not on file  Transportation Needs: Not on file  Physical Activity: Not on file  Stress: Not on file  Social Connections: Not on file  Intimate Partner Violence: Not on file   Family History  Problem Relation Age of Onset   Heart murmur Father    Diabetes Father    Hypertension Father    Hyperlipidemia Father    Cancer Father        throat   Diabetes Mother    Cancer Mother        breast.uterine, ovarian   Asthma Son    Appendicitis Son    Hernia Son    Mental illness Son        Bipolar, personality d/o   Stroke Maternal Grandmother    Cancer Maternal Grandmother        breast   Cancer Paternal Grandfather        lung   Colon cancer Neg Hx     Objective: Office vital signs reviewed. BP 131/86   Pulse 90   Temp (!) 97 F (36.1 C) (Temporal)   Ht _0  (1.778 m)   Wt (!) 401 lb (181.9 kg)   SpO2 98%   BMI 57.54 kg/m   Physical Examination:  General: Awake, alert, morbidly obese, No acute distress HEENT: Normal, MMM Cardio: regular rate and rhythm, S1S2 heard, no murmurs appreciated Pulm: clear to auscultation bilaterally, no wheezes, rhonchi or rales; normal work of breathing on room air Extremities: warm, well perfused, No edema, cyanosis or clubbing; +2 pulses bilaterally MSK: Slow, independent gait    Assessment/ Plan: 40 y.o. female   Uncontrolled type 2  diabetes mellitus with hyperglycemia ([place]) - Plan: Bayer DCA Hb A1c Waived, metFORMIN (GLUCOPHAGE) 1000 MG tablet, CMP14+EGFR, Lipid Panel, JARDIANCE 10 MG TABS tablet, empagliflozin (JARDIANCE) 25 MG TABS tablet  Hyperlipidemia associated with type 2 diabetes mellitus (HCC) - Plan: fenofibrate (TRICOR) 48 MG tablet, rosuvastatin (CRESTOR) 40 MG tablet, CMP14+EGFR, Lipid Panel  Diabetic neuropathy, painful (HCC) - Plan: pregabalin (LYRICA) 150 MG capsule, Drug Screen 10 W/Conf, Se  Morbid obesity (HCC)  Chronic bilateral low back pain with bilateral sciatica - Plan: traMADol (ULTRAM) 50 MG tablet, Drug Screen 10 W/Conf, Se  BMI 50.0-59.9, adult (HCC)  Chronic diastolic CHF (congestive heart failure) (HCC) - Plan: JARDIANCE 10 MG TABS tablet, empagliflozin (JARDIANCE) 25 MG TABS tablet  Sugar remains uncontrolled and in fact she is had a rapid rise in her A1c to 8.8 today.  I am starting her on Jardiance in addition to current medications.  No apparent contraindications.  She will start with 10 mg tablets for the next 2 weeks.  Samples have been provided.  Then she will advance to 25 mg daily.  We will CC her cardiologist as [place].  Continue statin.  Continue Tricor.  Lipid panel and CMP ordered.  Will CC to cardiology  Back pain is stable.  Continue tramadol as needed  Remains morbidly obese.  This increases risk of progression of diseases.  She understands these risks.  No evidence of fluid overload today.  Jardiance added as above  Advance Lyrica to 150 mg twice daily.  She will start with 100 mg in the morning and 150 in the evening for the next week or so then she can advance to the full twice daily dosing  of 150 mg.  Drug screen and CSC were completed as per office policy today.  She will follow-up in 3 months for recheck of diabetes and neuropathy  Orders Placed This Encounter  Procedures   Bayer DCA Hb A1c Waived   Meds ordered this encounter  Medications   traMADol (ULTRAM) 50  MG tablet    Sig: Take 1-2 tablets (50-100 mg total) by mouth every 12 (twelve) hours as needed for severe pain (ONLY IF NEEDED; place on hold).    Dispense:  60 tablet    Refill:  1   fenofibrate (TRICOR) 48 MG tablet    Sig: Take 1 tablet (48 mg total) by mouth daily.    Dispense:  30 tablet    Refill:  5   metFORMIN (GLUCOPHAGE) 1000 MG tablet    Sig: Take 1 tablet (1,000 mg total) by mouth 2 (two) times daily.    Dispense:  60 tablet    Refill:  2   rosuvastatin (CRESTOR) 40 MG tablet    Sig: Take 1 tablet (40 mg total) by mouth daily.    Dispense:  30 tablet    Refill:  6    This prescription was filled on 10/01/2020. Any refills authorized will be placed on file.   pregabalin (LYRICA) 150 MG capsule    Sig: Take 1 capsule (150 mg total) by mouth 2 (two) times daily.    Dispense:  60 capsule    Refill:  3   JARDIANCE 10 MG TABS tablet    Sig: Take 1 tablet (10 mg total) by mouth daily before breakfast for 14 days.    Dispense:  14 tablet    Refill:  0   empagliflozin (JARDIANCE) 25 MG TABS tablet    Sig: Take 1 tablet (25 mg total) by mouth daily before breakfast.    Dispense:  30 tablet    Refill:  12     Ivelise Castillo Windell Moulding, DO [place] [phone]

## 2021-03-12 ENCOUNTER — Telehealth: Payer: Self-pay

## 2021-03-12 NOTE — Telephone Encounter (Signed)
Approvedtoday PA Case: [id number], Status: Approved, Coverage Starts on: 03/12/2021 12:00:00 AM, Coverage Ends on: 03/12/2022 12:00:00 AM.

## 2021-03-14 LAB — DRUG SCREEN 10 W/CONF, SERUM
Amphetamines, IA: NEGATIVE ng/mL
Barbiturates, IA: NEGATIVE ug/mL
Benzodiazepines, IA: NEGATIVE ng/mL
Cocaine & Metabolite, IA: NEGATIVE ng/mL
Methadone, IA: NEGATIVE ng/mL
Opiates, IA: NEGATIVE ng/mL
Oxycodones, IA: NEGATIVE ng/mL
Phencyclidine, IA: NEGATIVE ng/mL
Propoxyphene, IA: NEGATIVE ng/mL
THC(Marijuana) Metabolite, IA: NEGATIVE ng/mL

## 2021-03-14 LAB — CMP14+EGFR
ALT: 47 IU/L — ABNORMAL HIGH (ref 0–32)
AST: 46 IU/L — ABNORMAL HIGH (ref 0–40)
Albumin/Globulin Ratio: 1.1 — ABNORMAL LOW (ref 1.2–2.2)
Albumin: 3.7 g/dL — ABNORMAL LOW (ref 3.8–4.8)
Alkaline Phosphatase: 85 IU/L (ref 44–121)
BUN/Creatinine Ratio: 11 (ref 9–23)
BUN: 7 mg/dL (ref 6–24)
Bilirubin Total: 0.4 mg/dL (ref 0.0–1.2)
CO2: 18 mmol/L — ABNORMAL LOW (ref 20–29)
Calcium: 9 mg/dL (ref 8.7–10.2)
Chloride: 101 mmol/L (ref 96–106)
Creatinine, Ser: 0.65 mg/dL (ref 0.57–1.00)
Globulin, Total: 3.4 g/dL (ref 1.5–4.5)
Glucose: 191 mg/dL — ABNORMAL HIGH (ref 65–99)
Potassium: 4.4 mmol/L (ref 3.5–5.2)
Sodium: 135 mmol/L (ref 134–144)
Total Protein: 7.1 g/dL (ref 6.0–8.5)
eGFR: 113 mL/min/{1.73_m2} (ref >59)

## 2021-03-14 LAB — LIPID PANEL
Chol/HDL Ratio: 4.9 ratio — ABNORMAL HIGH (ref 0.0–4.4)
Cholesterol, Total: 167 mg/dL (ref 100–199)
HDL: 34 mg/dL — ABNORMAL LOW (ref >39)
LDL Chol Calc (NIH): 103 mg/dL — ABNORMAL HIGH (ref 0–99)
Triglycerides: 170 mg/dL — ABNORMAL HIGH (ref 0–149)
VLDL Cholesterol Cal: 30 mg/dL (ref 5–40)

## 2021-03-19 ENCOUNTER — Telehealth: Payer: Self-pay

## 2021-03-19 MED ORDER — EZETIMIBE 10 MG PO TABS: 10.0000 mg | ORAL_TABLET | Freq: Every day | ORAL | 3 refills | Status: DC

## 2021-03-19 NOTE — Telephone Encounter (Signed)
Pt's spouse made aware and verbalized understanding. Pt's medication list updated to reflect current medication changes

## 2021-03-19 NOTE — Telephone Encounter (Signed)
-----   Message from Antoine Poche, MD sent at 03/09/2021 10:50 AM EDT ----- LDL is too high, can we add zetia 10mg  once daily. Continue crestor 40mg  daily  MD

## 2021-03-25 ENCOUNTER — Other Ambulatory Visit: Payer: Self-pay | Admitting: Family Medicine

## 2021-03-25 ENCOUNTER — Encounter: Payer: Self-pay | Admitting: Family Medicine

## 2021-03-25 DIAGNOSIS — B3731 Acute candidiasis of vulva and vagina: Secondary | ICD-10-CM

## 2021-03-25 MED ORDER — FLUCONAZOLE 150 MG PO TABS: 150.0000 mg | ORAL_TABLET | Freq: Once | ORAL | 0 refills | Status: AC

## 2021-03-30 ENCOUNTER — Other Ambulatory Visit: Payer: Self-pay | Admitting: Cardiology

## 2021-03-30 ENCOUNTER — Other Ambulatory Visit: Payer: Self-pay | Admitting: Family Medicine

## 2021-03-30 DIAGNOSIS — R110 Nausea: Secondary | ICD-10-CM

## 2021-03-30 DIAGNOSIS — R142 Eructation: Secondary | ICD-10-CM

## 2021-03-30 DIAGNOSIS — F411 Generalized anxiety disorder: Secondary | ICD-10-CM

## 2021-03-30 DIAGNOSIS — N182 Chronic kidney disease, stage 2 (mild): Secondary | ICD-10-CM

## 2021-03-30 DIAGNOSIS — E1165 Type 2 diabetes mellitus with hyperglycemia: Secondary | ICD-10-CM

## 2021-04-08 ENCOUNTER — Ambulatory Visit: Payer: Medicaid Other | Admitting: Gastroenterology

## 2021-04-08 ENCOUNTER — Encounter: Payer: Self-pay | Admitting: Internal Medicine

## 2021-04-08 ENCOUNTER — Other Ambulatory Visit: Payer: Self-pay | Admitting: Family Medicine

## 2021-04-08 ENCOUNTER — Encounter: Payer: Self-pay | Admitting: Gastroenterology

## 2021-04-08 DIAGNOSIS — N182 Chronic kidney disease, stage 2 (mild): Secondary | ICD-10-CM

## 2021-04-08 DIAGNOSIS — R110 Nausea: Secondary | ICD-10-CM

## 2021-04-08 DIAGNOSIS — F411 Generalized anxiety disorder: Secondary | ICD-10-CM

## 2021-04-08 DIAGNOSIS — R142 Eructation: Secondary | ICD-10-CM

## 2021-04-08 NOTE — Telephone Encounter (Signed)
Please make sure patient is following up with her GYN for future refills/ surveillance of this medication.

## 2021-04-08 NOTE — Progress Notes (Deleted)
Primary Care Physician: Janora Norlander, DO  Primary Gastroenterologist:    No chief complaint on file.   HPI: [name] is a 40 y.o. female here  EGD 09/2020: -Mild erosive reflux esophagitis. Abnormal stomach as described - biopsied (chronic active gastritis. H.pylori present). -Normal duodenal bulb and second portion of the duodenum.   ***treated  Current Outpatient Medications  Medication Sig Dispense Refill   ezetimibe (ZETIA) 10 MG tablet Take 1 tablet (10 mg total) by mouth daily. 90 tablet 3   ACCU-CHEK AVIVA PLUS test strip USE TO TEST BLOOD SUGAR FOUR TIMES DAILY 400 strip 3   Accu-Chek Softclix Lancets lancets Check BS four times daily Dx E11.9 100 each 11   aspirin 81 MG chewable tablet Chew 81 mg by mouth daily.     blood glucose meter kit and supplies Dispense based on patient and insurance preference. Use up to four times daily as directed. (FOR ICD-10:  E11.22). Check BG daily 1 each 0   busPIRone (BUSPAR) 10 MG tablet Take 1 tablet (10 mg total) by mouth 2 (two) times daily. 60 tablet 12   carvedilol (COREG) 25 MG tablet TAKE 1 TABLET BY MOUTH TWICE DAILY WITH A MEAL 180 tablet 1   clopidogrel (PLAVIX) 75 MG tablet TAKE 1 TABLET BY MOUTH EVERY DAY 94 tablet 3   colestipol (COLESTID) 1 g tablet Take 2 tablets (2 g total) by mouth daily. Do not take within 4 hours of other medications. 60 tablet 3   empagliflozin (JARDIANCE) 25 MG TABS tablet Take 1 tablet (25 mg total) by mouth daily before breakfast. 30 tablet 12   fenofibrate (TRICOR) 48 MG tablet Take 1 tablet (48 mg total) by mouth daily. 30 tablet 5   FLUoxetine (PROZAC) 20 MG capsule TAKE THREE CAPSULES BY MOUTH DAILY 90 capsule 0   Insulin Pen Needle (GLOBAL EASE INJECT PEN NEEDLES) 31G X 5 MM MISC USE ONCE EVERY DAY 100 each 3   lidocaine (LIDODERM) 5 % Place 1 patch onto the skin daily. Remove & Discard patch within 12 hours or as directed by MD. NOT a candidate for NSAIDs due to MI/ CAD.  30 patch prn   losartan (COZAAR) 25 MG tablet TAKE 1 TABLET BY MOUTH DAILY 90 tablet 1   medroxyPROGESTERone (PROVERA) 5 MG tablet TAKE 1 TABLET BY MOUTH DAILY 90 tablet 0   metFORMIN (GLUCOPHAGE) 1000 MG tablet Take 1 tablet (1,000 mg total) by mouth 2 (two) times daily. 60 tablet 2   nitroGLYCERIN (NITROSTAT) 0.4 MG SL tablet DISSOLVE 1 TABLET UNDER THE TONGUE EVERY 5 MINUTES AS NEEDED FOR CHEST PAIN. DO NOT EXCEED A TOTAL OF 3 DOSES IN 15 MINUTES. (Patient taking differently: Place 0.4 mg under the tongue every 5 (five) minutes as needed for chest pain.) 25 tablet 3   pantoprazole (PROTONIX) 40 MG tablet TAKE 1 TABLET BY MOUTH DAILY 30 tablet 2   potassium chloride SA (KLOR-CON) 20 MEQ tablet TAKE 1 TABLET BY MOUTH EVERY DAY (Patient taking differently: Take 20 mEq by mouth daily as needed (when taking Torsemide).) 90 tablet 3   pregabalin (LYRICA) 150 MG capsule Take 1 capsule (150 mg total) by mouth 2 (two) times daily. 60 capsule 3   ranolazine (RANEXA) 1000 MG SR tablet TAKE 1 TABLET BY MOUTH TWICE DAILY 180 tablet 3   rosuvastatin (CRESTOR) 40 MG tablet Take 1 tablet (40 mg total) by mouth daily. 30 tablet 6   torsemide (DEMADEX) 20 MG  tablet TAKE TWO TABLETS BY MOUTH DAILY AS NEEDED FOR swelling 180 tablet 3   traMADol (ULTRAM) 50 MG tablet Take 1-2 tablets (50-100 mg total) by mouth every 12 (twelve) hours as needed for severe pain (ONLY IF NEEDED; place on hold). 60 tablet 1   VASCEPA 1 g capsule TAKE TWO CAPSULES BY MOUTH TWICE DAILY 120 capsule 11   VICTOZA 18 MG/3ML SOPN INJECT 1.2 MG into THE SKIN DAILY 6 mL 0   No current facility-administered medications for this visit.    Allergies as of 04/08/2021 - Review Complete 03/06/2021  Allergen Reaction Noted   Bee venom Anaphylaxis and Swelling 06/23/2012   Latex Itching 08/14/2018   Sulfa drugs cross reactors Nausea And Vomiting 10/08/2011    ROS:  General: Negative for anorexia, weight loss, fever, chills, fatigue,  weakness. ENT: Negative for hoarseness, difficulty swallowing , nasal congestion. CV: Negative for chest pain, angina, palpitations, dyspnea on exertion, peripheral edema.  Respiratory: Negative for dyspnea at rest, dyspnea on exertion, cough, sputum, wheezing.  GI: See history of present illness. GU:  Negative for dysuria, hematuria, urinary incontinence, urinary frequency, nocturnal urination.  Endo: Negative for unusual weight change.    Physical Examination:   There were no vitals taken for this visit.  General: Well-nourished, well-developed in no acute distress.  Eyes: No icterus. Mouth: Oropharyngeal mucosa moist and pink , no lesions erythema or exudate. Lungs: Clear to auscultation bilaterally.  Heart: Regular rate and rhythm, no murmurs rubs or gallops.  Abdomen: Bowel sounds are normal, nontender, nondistended, no hepatosplenomegaly or masses, no abdominal bruits or hernia , no rebound or guarding.   Extremities: No lower extremity edema. No clubbing or deformities. Neuro: Alert and oriented x 4   Skin: Warm and dry, no jaundice.   Psych: Alert and cooperative, normal mood and affect.  Labs:  ***  Imaging Studies: No results found.   Assessment:     Plan:

## 2021-04-11 ENCOUNTER — Other Ambulatory Visit: Payer: Self-pay | Admitting: Family Medicine

## 2021-04-11 DIAGNOSIS — M5442 Lumbago with sciatica, left side: Secondary | ICD-10-CM

## 2021-04-11 DIAGNOSIS — G8929 Other chronic pain: Secondary | ICD-10-CM

## 2021-04-28 ENCOUNTER — Other Ambulatory Visit: Payer: Self-pay | Admitting: Family Medicine

## 2021-04-28 DIAGNOSIS — R110 Nausea: Secondary | ICD-10-CM

## 2021-04-28 DIAGNOSIS — E1122 Type 2 diabetes mellitus with diabetic chronic kidney disease: Secondary | ICD-10-CM

## 2021-04-28 DIAGNOSIS — F411 Generalized anxiety disorder: Secondary | ICD-10-CM

## 2021-04-28 DIAGNOSIS — R142 Eructation: Secondary | ICD-10-CM

## 2021-04-29 ENCOUNTER — Other Ambulatory Visit: Payer: Self-pay | Admitting: *Deleted

## 2021-04-29 DIAGNOSIS — Z8616 Personal history of COVID-19: Secondary | ICD-10-CM

## 2021-04-29 DIAGNOSIS — R0602 Shortness of breath: Secondary | ICD-10-CM

## 2021-04-29 NOTE — Progress Notes (Signed)
Per Dr. Wyline Mood, refer to [facility] for SOB and h/o covid

## 2021-05-04 ENCOUNTER — Other Ambulatory Visit: Payer: Self-pay | Admitting: Family Medicine

## 2021-05-09 ENCOUNTER — Other Ambulatory Visit: Payer: Self-pay | Admitting: Family Medicine

## 2021-05-09 DIAGNOSIS — M5441 Lumbago with sciatica, right side: Secondary | ICD-10-CM

## 2021-05-09 DIAGNOSIS — G8929 Other chronic pain: Secondary | ICD-10-CM

## 2021-05-13 ENCOUNTER — Telehealth: Payer: Self-pay | Admitting: Family Medicine

## 2021-05-13 NOTE — Telephone Encounter (Signed)
  Prescription Request  05/13/2021  What is the name of the medication or equipment? tramadol  Have you contacted your pharmacy to request a refill? (if applicable) yes  Which pharmacy would you like this sent to? [facility]   Patient notified that their request is being sent to the clinical staff for review and that they should receive a response within 2 business days.

## 2021-05-13 NOTE — Telephone Encounter (Signed)
Patient last seen 06/10. Patient has follow up on 09/12 please review

## 2021-05-13 NOTE — Telephone Encounter (Signed)
Controlled substances cannot be filled without a face-to-face visit

## 2021-05-14 ENCOUNTER — Ambulatory Visit (INDEPENDENT_AMBULATORY_CARE_PROVIDER_SITE_OTHER): Payer: Medicaid Other | Admitting: Family Medicine

## 2021-05-14 ENCOUNTER — Encounter: Payer: Self-pay | Admitting: Family Medicine

## 2021-05-14 ENCOUNTER — Other Ambulatory Visit: Payer: Self-pay | Admitting: Family Medicine

## 2021-05-14 ENCOUNTER — Ambulatory Visit (INDEPENDENT_AMBULATORY_CARE_PROVIDER_SITE_OTHER): Payer: Medicaid Other

## 2021-05-14 VITALS — BP 148/102 | HR 96 | Ht 70.0 in

## 2021-05-14 DIAGNOSIS — R0602 Shortness of breath: Secondary | ICD-10-CM | POA: Diagnosis not present

## 2021-05-14 DIAGNOSIS — I517 Cardiomegaly: Secondary | ICD-10-CM | POA: Diagnosis not present

## 2021-05-14 DIAGNOSIS — G8929 Other chronic pain: Secondary | ICD-10-CM

## 2021-05-14 MED ORDER — PREDNISONE 20 MG PO TABS: ORAL_TABLET | ORAL | 0 refills | Status: DC

## 2021-05-14 MED ORDER — ALBUTEROL SULFATE HFA 108 (90 BASE) MCG/ACT IN AERS: 2.0000 | INHALATION_SPRAY | Freq: Four times a day (QID) | RESPIRATORY_TRACT | 0 refills | Status: DC | PRN

## 2021-05-14 MED ORDER — TRAMADOL HCL 50 MG PO TABS: 50.0000 mg | ORAL_TABLET | Freq: Two times a day (BID) | ORAL | 0 refills | Status: DC | PRN

## 2021-05-14 NOTE — Patient Instructions (Signed)
Sliding scale DIABETIC SLIDING SCALE (II)  IF BLOOD SUGAR IS:  LESS THAN 100 (NO INSULIN)  100-140 (3 UNITS)  141-180 (6 UNITS)  181-220 (9 UNITS)  221-260 (12 UNITS)  261-300 (15 UNITS)  301-340 (18 UNITS)  MORE THAN 341 (21 UNITS)

## 2021-05-14 NOTE — Progress Notes (Signed)
BP (!) 148/102   Pulse 96   Ht 5' 10"  (1.778 m)   SpO2 95%   BMI 57.54 kg/m    Subjective:   Patient ID: [name], female    DOB: [birth year], 40 y.o.   MRN: [id number]  HPI: [name] is a 40 y.o. female presenting on 05/14/2021 for URI (Covid positive one month ago. SOB and coughing. Smokes less than 1/2 ppd) and Dizziness   HPI Patient comes in with complaints of shortness of breath and a dry cough that has been going on over the past month at least.  She says she had COVID a month ago and has not been feeling better since then.  She continues to be short of breath and even walking 3 or 4 feet she gets short of breath.  She does [place] smoke she smokes about half pack per day.  She is having some dizziness and lightheadedness when she walks and feels short of breath.  She does wear self every day for the congestive heart failure and does not feel like its been worsening.  Relevant past medical, surgical, family and social history reviewed and updated as indicated. Interim medical history since our last visit reviewed. Allergies and medications reviewed and updated.  Review of Systems  Constitutional:  Negative for chills and fever.  HENT:  Negative for congestion, ear discharge, ear pain and sinus pressure.   Eyes:  Negative for visual disturbance.  Respiratory:  Positive for cough and shortness of breath. Negative for chest tightness and wheezing.   Cardiovascular:  Negative for chest pain and leg swelling.  Genitourinary:  Negative for difficulty urinating and dysuria.  Musculoskeletal:  Negative for back pain and gait problem.  Skin:  Negative for rash.  Neurological:  Negative for light-headedness and headaches.  Psychiatric/Behavioral:  Negative for agitation and behavioral problems.   All other systems reviewed and are negative.  Per HPI unless specifically indicated above   Allergies as of 05/14/2021       Reactions   Bee Venom Anaphylaxis, Swelling    Latex Itching   Sulfa Drugs Cross Reactors Nausea And Vomiting        Medication List        Accurate as of May 14, 2021  4:30 PM. If you have any questions, ask your nurse or doctor.          Accu-Chek Aviva Plus test strip Generic drug: glucose blood USE TO TEST BLOOD SUGAR FOUR TIMES DAILY   Accu-Chek Softclix Lancets lancets BOOD SUGAR FOUR TIMES DAILY Dx E11.9   aspirin 81 MG chewable tablet Chew 81 mg by mouth daily.   blood glucose meter kit and supplies Dispense based on patient and insurance preference. Use up to four times daily as directed. (FOR ICD-10:  E11.22). Check BG daily   busPIRone 10 MG tablet Commonly known as: BUSPAR Take 1 tablet (10 mg total) by mouth 2 (two) times daily.   carvedilol 25 MG tablet Commonly known as: COREG TAKE 1 TABLET BY MOUTH TWICE DAILY WITH A MEAL   clopidogrel 75 MG tablet Commonly known as: PLAVIX TAKE 1 TABLET BY MOUTH EVERY DAY   colestipol 1 g tablet Commonly known as: COLESTID Take 2 tablets (2 g total) by mouth daily. Do not take within 4 hours of other medications.   empagliflozin 25 MG Tabs tablet Commonly known as: Jardiance Take 1 tablet (25 mg total) by mouth daily before breakfast.   ezetimibe 10 MG tablet  Commonly known as: ZETIA Take 1 tablet (10 mg total) by mouth daily.   fenofibrate 48 MG tablet Commonly known as: TRICOR Take 1 tablet (48 mg total) by mouth daily.   FLUoxetine 20 MG capsule Commonly known as: PROZAC TAKE THREE CAPSULES BY MOUTH DAILY   Global Ease Inject Pen Needles 31G X 5 MM Misc Generic drug: Insulin Pen Needle USE ONCE EVERY DAY   lidocaine 5 % Commonly known as: Lidoderm Place 1 patch onto the skin daily. Remove & Discard patch within 12 hours or as directed by MD. NOT a candidate for NSAIDs due to MI/ CAD.   losartan 25 MG tablet Commonly known as: COZAAR TAKE 1 TABLET BY MOUTH DAILY   medroxyPROGESTERone 5 MG tablet Commonly known as: PROVERA Take 1  tablet (5 mg total) by mouth daily. NOV with GYN   metFORMIN 1000 MG tablet Commonly known as: GLUCOPHAGE Take 1 tablet (1,000 mg total) by mouth 2 (two) times daily.   nitroGLYCERIN 0.4 MG SL tablet Commonly known as: NITROSTAT DISSOLVE 1 TABLET UNDER THE TONGUE EVERY 5 MINUTES AS NEEDED FOR CHEST PAIN. DO NOT EXCEED A TOTAL OF 3 DOSES IN 15 MINUTES. What changed: See the new instructions.   pantoprazole 40 MG tablet Commonly known as: PROTONIX TAKE 1 TABLET BY MOUTH DAILY   potassium chloride SA 20 MEQ tablet Commonly known as: KLOR-CON TAKE 1 TABLET BY MOUTH EVERY DAY What changed:  when to take this reasons to take this   predniSONE 20 MG tablet Commonly known as: DELTASONE Take 3 tabs daily for 1 week, then 2 tabs daily for week 2, then 1 tab daily for week 3. Started by: Worthy Rancher, MD   pregabalin 150 MG capsule Commonly known as: Lyrica Take 1 capsule (150 mg total) by mouth 2 (two) times daily.   ranolazine 1000 MG SR tablet Commonly known as: RANEXA TAKE 1 TABLET BY MOUTH TWICE DAILY   rosuvastatin 40 MG tablet Commonly known as: CRESTOR Take 1 tablet (40 mg total) by mouth daily.   torsemide 20 MG tablet Commonly known as: DEMADEX TAKE TWO TABLETS BY MOUTH DAILY AS NEEDED FOR swelling   traMADol 50 MG tablet Commonly known as: ULTRAM Take 1-2 tablets (50-100 mg total) by mouth every 12 (twelve) hours as needed for severe pain (ONLY IF NEEDED!!). What changed: reasons to take this Changed by: Ronnie Doss, DO   Vascepa 1 g capsule Generic drug: icosapent Ethyl TAKE TWO CAPSULES BY MOUTH TWICE DAILY   Victoza 18 MG/3ML Sopn Generic drug: liraglutide INJECT 1.2 MG into THE SKIN DAILY         Objective:   BP (!) 148/102   Pulse 96   Ht 5' 10"  (1.778 m)   SpO2 95%   BMI 57.54 kg/m   Wt Readings from Last 3 Encounters:  03/06/21 (!) 401 lb (181.9 kg)  02/19/21 (!) 404 lb (183.3 kg)  12/03/20 (!) 396 lb (179.6 kg)    Physical  Exam Vitals and nursing note reviewed.  Constitutional:      General: She is not in acute distress.    Appearance: She is well-developed. She is not diaphoretic.  Eyes:     Conjunctiva/sclera: Conjunctivae normal.  Cardiovascular:     Rate and Rhythm: Regular rhythm. Tachycardia present.     Heart sounds: Normal heart sounds. No murmur heard. Pulmonary:     Effort: Pulmonary effort is normal. No respiratory distress.     Breath sounds: Normal breath sounds.  No wheezing, rhonchi or rales.  Musculoskeletal:        General: No swelling or tenderness. Normal range of motion.  Skin:    General: Skin is warm and dry.     Findings: No rash.  Neurological:     Mental Status: She is alert and oriented to person, place, and time.     Coordination: Coordination normal.  Psychiatric:        Behavior: Behavior normal.    Chest x-ray:  no acute bony abnormality  Assessment & Plan:   Problem List Items Addressed This Visit   None Visit Diagnoses     Shortness of breath    -  Primary   Relevant Medications   predniSONE (DELTASONE) 20 MG tablet   Other Relevant Orders   DG Chest 2 View     Patient has shortness of breath status post-COVID syndrome, likely inflammation, possibly pericarditis but did not show anything on the x-ray.  Will do short course of prednisone.  She does have an appointment with pulmonology later this month.  Follow up plan: Return if symptoms worsen or fail to improve.  Counseling provided for all of the vaccine components Orders Placed This Encounter  Procedures   DG Chest [place] Janaia Kozel, MD [facility] 05/14/2021, 4:30 PM

## 2021-05-18 ENCOUNTER — Telehealth: Payer: Self-pay | Admitting: *Deleted

## 2021-05-18 DIAGNOSIS — G8929 Other chronic pain: Secondary | ICD-10-CM

## 2021-05-18 DIAGNOSIS — M5442 Lumbago with sciatica, left side: Secondary | ICD-10-CM

## 2021-05-18 NOTE — Telephone Encounter (Signed)
traMADol HCl 50MG  tablets PA came in  Key: [id number]   Original Claim Info 75 PLAN LIMITATION EXCEEDED 5 DAYS SUPPLY.CALL [phone] OR SUBMIT PA TO [email or web address] 3 DS O/R, USE [facility] [phone] DRUG REQUIRES PRIOR AUTHORIZATION For [employer] Coupon Price of: $21.48 submit to [id number] PCN: CP Group: [id number] --Service provided at no cost and no switch fee to the pharmacy  Sent to plan

## 2021-05-20 ENCOUNTER — Encounter: Payer: Self-pay | Admitting: Family Medicine

## 2021-05-20 NOTE — Telephone Encounter (Signed)
Yes go ahead and give her more Fiasp, she like a prescription for it we can go ahead and give her prescription for either NovoLog or Humalog or Fiasp but if we have a sample give her some

## 2021-05-20 NOTE — Telephone Encounter (Signed)
This is a chronic medication for patient.  She runs into this issue every time we start a new rx.  Raynelle Fanning, can you look into this?

## 2021-05-20 NOTE — Telephone Encounter (Signed)
Insurance denied today.

## 2021-05-23 ENCOUNTER — Encounter: Payer: Self-pay | Admitting: Family Medicine

## 2021-05-26 ENCOUNTER — Other Ambulatory Visit: Payer: Self-pay | Admitting: Family Medicine

## 2021-05-26 DIAGNOSIS — E1122 Type 2 diabetes mellitus with diabetic chronic kidney disease: Secondary | ICD-10-CM

## 2021-05-26 DIAGNOSIS — R110 Nausea: Secondary | ICD-10-CM

## 2021-05-26 DIAGNOSIS — R142 Eructation: Secondary | ICD-10-CM

## 2021-05-26 DIAGNOSIS — N182 Chronic kidney disease, stage 2 (mild): Secondary | ICD-10-CM

## 2021-05-26 DIAGNOSIS — F411 Generalized anxiety disorder: Secondary | ICD-10-CM

## 2021-06-08 ENCOUNTER — Ambulatory Visit: Payer: Medicaid Other | Admitting: Family Medicine

## 2021-06-08 ENCOUNTER — Encounter: Payer: Self-pay | Admitting: Family Medicine

## 2021-06-08 ENCOUNTER — Other Ambulatory Visit: Payer: Self-pay

## 2021-06-08 VITALS — BP 130/89 | HR 107 | Temp 98.0°F | Ht 70.0 in | Wt 389.0 lb

## 2021-06-08 DIAGNOSIS — I152 Hypertension secondary to endocrine disorders: Secondary | ICD-10-CM

## 2021-06-08 DIAGNOSIS — E785 Hyperlipidemia, unspecified: Secondary | ICD-10-CM

## 2021-06-08 DIAGNOSIS — R748 Abnormal levels of other serum enzymes: Secondary | ICD-10-CM | POA: Diagnosis not present

## 2021-06-08 DIAGNOSIS — Z8616 Personal history of COVID-19: Secondary | ICD-10-CM | POA: Diagnosis not present

## 2021-06-08 DIAGNOSIS — F411 Generalized anxiety disorder: Secondary | ICD-10-CM

## 2021-06-08 DIAGNOSIS — E1169 Type 2 diabetes mellitus with other specified complication: Secondary | ICD-10-CM

## 2021-06-08 DIAGNOSIS — M5441 Lumbago with sciatica, right side: Secondary | ICD-10-CM

## 2021-06-08 DIAGNOSIS — G8929 Other chronic pain: Secondary | ICD-10-CM

## 2021-06-08 DIAGNOSIS — M5442 Lumbago with sciatica, left side: Secondary | ICD-10-CM | POA: Diagnosis not present

## 2021-06-08 DIAGNOSIS — R0602 Shortness of breath: Secondary | ICD-10-CM | POA: Diagnosis not present

## 2021-06-08 DIAGNOSIS — Z6841 Body Mass Index (BMI) 40.0 and over, adult: Secondary | ICD-10-CM

## 2021-06-08 DIAGNOSIS — E1165 Type 2 diabetes mellitus with hyperglycemia: Secondary | ICD-10-CM | POA: Diagnosis not present

## 2021-06-08 DIAGNOSIS — E1140 Type 2 diabetes mellitus with diabetic neuropathy, unspecified: Secondary | ICD-10-CM

## 2021-06-08 DIAGNOSIS — E1159 Type 2 diabetes mellitus with other circulatory complications: Secondary | ICD-10-CM | POA: Diagnosis not present

## 2021-06-08 LAB — BAYER DCA HB A1C WAIVED: HB A1C (BAYER DCA - WAIVED): 7.9 % — ABNORMAL HIGH (ref 4.8–5.6)

## 2021-06-08 MED ORDER — VICTOZA 18 MG/3ML ~~LOC~~ SOPN: PEN_INJECTOR | SUBCUTANEOUS | 3 refills | Status: DC

## 2021-06-08 MED ORDER — PREGABALIN 150 MG PO CAPS: 150.0000 mg | ORAL_CAPSULE | Freq: Two times a day (BID) | ORAL | 3 refills | Status: DC

## 2021-06-08 MED ORDER — TRAMADOL HCL 50 MG PO TABS: 50.0000 mg | ORAL_TABLET | Freq: Two times a day (BID) | ORAL | 3 refills | Status: DC | PRN

## 2021-06-08 NOTE — Progress Notes (Signed)
Subjective: CC:DM PCP: Katelyn Norlander, DO [name] is a 40 y.o. female presenting to clinic today for:  1. Type 2 Diabetes with hypertension, hyperlipidemia:  LDL elevated.  LFTs elevated last lab draw.  She is compliant with all medications.  She is really been trying to watch her diet and improve exercise.  She feels like her clothes are looser.  Unfortunately she got ill with COVID since her last visit and was placed on a little bit of steroid.  This did cause a rise in her blood sugar.  She also has continued to not have good breathing since that time.  She reports mucous plugging but is not taking any Mucinex.  Has an appointment with pulmonology in about 10 days.  Last eye exam: UTD Last foot exam: UTD Last A1c:  Lab Results  Component Value Date   HGBA1C 8.8 (H) 03/06/2021   Nephropathy screen indicated?: UTD Last flu, zoster and/or pneumovax:  Immunization History  Administered Date(s) Administered   Influenza Split 10/10/2011   Influenza,inj,Quad PF,6+ Mos 10/28/2017, 06/26/2018, 06/05/2019, 08/19/2020   Influenza-Unspecified 06/26/2018, 06/05/2019   PFIZER(Purple Top)SARS-COV-2 Vaccination 03/27/2020   Pneumococcal Conjugate-13 10/28/2017   Pneumococcal Polysaccharide-23 10/10/2011   Tdap 01/25/2018    ROS: Per HPI  Allergies  Allergen Reactions   Bee Venom Anaphylaxis and Swelling   Latex Itching   Sulfa Drugs Cross Reactors Nausea And Vomiting   Past Medical History:  Diagnosis Date   Allergy    Anemia    Anginal pain ([place])    Anxiety    CHF (congestive heart failure) (HCC)    Chronic kidney disease    history of kidney stones   Coronary artery disease 09/2011   a.s/p BMS to LAD and angioplasty of RI in 09/2011 b. patent stent by cath in 02/2012, 12/2013, and 02/2016 with most recent showing 30-40% dLAD and 30-40% RCA stenosis   Depression    Diabetes mellitus    Dyspnea    Hyperlipidemia    Hypertension    Midsternal chest pain  03/03/2012   Migraines    Morbid obesity ([place])    Myocardial infarct ([place]) 09/2011   Myocardial infarction ([place]) 02/2016   Neuromuscular disorder ([place])    Palpitations 02/17/2012   Ureteral stone with hydronephrosis    Urinary tract obstruction due to kidney stone 02/15/2017    Current Outpatient Medications:    ACCU-CHEK AVIVA PLUS test strip, USE TO TEST BLOOD SUGAR FOUR TIMES DAILY, Disp: 400 strip, Rfl: 3   Accu-Chek Softclix Lancets lancets, BOOD SUGAR FOUR TIMES DAILY Dx E11.9, Disp: 100 each, Rfl: 11   albuterol (VENTOLIN HFA) 108 (90 Base) MCG/ACT inhaler, Inhale 2 puffs into the lungs every 6 (six) hours as needed for wheezing or shortness of breath., Disp: 8 g, Rfl: 0   aspirin 81 MG chewable tablet, Chew 81 mg by mouth daily., Disp: , Rfl:    blood glucose meter kit and supplies, Dispense based on patient and insurance preference. Use up to four times daily as directed. (FOR ICD-10:  E11.22). Check BG daily, Disp: 1 each, Rfl: 0   busPIRone (BUSPAR) 10 MG tablet, Take 1 tablet (10 mg total) by mouth 2 (two) times daily., Disp: 60 tablet, Rfl: 12   carvedilol (COREG) 25 MG tablet, TAKE 1 TABLET BY MOUTH TWICE DAILY WITH A MEAL, Disp: 180 tablet, Rfl: 1   clopidogrel (PLAVIX) 75 MG tablet, TAKE 1 TABLET BY MOUTH EVERY DAY, Disp: 94 tablet, Rfl: 3  colestipol (COLESTID) 1 g tablet, Take 2 tablets (2 g total) by mouth daily. Do not take within 4 hours of other medications., Disp: 60 tablet, Rfl: 3   empagliflozin (JARDIANCE) 25 MG TABS tablet, Take 1 tablet (25 mg total) by mouth daily before breakfast., Disp: 30 tablet, Rfl: 12   ezetimibe (ZETIA) 10 MG tablet, Take 1 tablet (10 mg total) by mouth daily., Disp: 90 tablet, Rfl: 3   fenofibrate (TRICOR) 48 MG tablet, Take 1 tablet (48 mg total) by mouth daily., Disp: 30 tablet, Rfl: 5   FLUoxetine (PROZAC) 20 MG capsule, TAKE THREE CAPSULES BY MOUTH DAILY, Disp: 90 capsule, Rfl: 0   Insulin Pen Needle (GLOBAL EASE INJECT PEN NEEDLES) 31G  X 5 MM MISC, USE ONCE EVERY DAY, Disp: 100 each, Rfl: 3   lidocaine (LIDODERM) 5 %, Place 1 patch onto the skin daily. Remove & Discard patch within 12 hours or as directed by MD. NOT a candidate for NSAIDs due to MI/ CAD., Disp: 30 patch, Rfl: prn   losartan (COZAAR) 25 MG tablet, TAKE 1 TABLET BY MOUTH DAILY, Disp: 90 tablet, Rfl: 1   medroxyPROGESTERone (PROVERA) 5 MG tablet, Take 1 tablet (5 mg total) by mouth daily. NOV with GYN, Disp: 90 tablet, Rfl: 0   metFORMIN (GLUCOPHAGE) 1000 MG tablet, Take 1 tablet (1,000 mg total) by mouth 2 (two) times daily., Disp: 60 tablet, Rfl: 2   nitroGLYCERIN (NITROSTAT) 0.4 MG SL tablet, DISSOLVE 1 TABLET UNDER THE TONGUE EVERY 5 MINUTES AS NEEDED FOR CHEST PAIN. DO NOT EXCEED A TOTAL OF 3 DOSES IN 15 MINUTES. (Patient taking differently: Place 0.4 mg under the tongue every 5 (five) minutes as needed for chest pain.), Disp: 25 tablet, Rfl: 3   pantoprazole (PROTONIX) 40 MG tablet, TAKE 1 TABLET BY MOUTH DAILY, Disp: 30 tablet, Rfl: 0   potassium chloride SA (KLOR-CON) 20 MEQ tablet, TAKE 1 TABLET BY MOUTH EVERY DAY (Patient taking differently: Take 20 mEq by mouth daily as needed (when taking Torsemide).), Disp: 90 tablet, Rfl: 3   predniSONE (DELTASONE) 20 MG tablet, Take 3 tabs daily for 1 week, then 2 tabs daily for week 2, then 1 tab daily for week 3., Disp: 42 tablet, Rfl: 0   pregabalin (LYRICA) 150 MG capsule, Take 1 capsule (150 mg total) by mouth 2 (two) times daily., Disp: 60 capsule, Rfl: 3   ranolazine (RANEXA) 1000 MG SR tablet, TAKE 1 TABLET BY MOUTH TWICE DAILY, Disp: 180 tablet, Rfl: 3   rosuvastatin (CRESTOR) 40 MG tablet, Take 1 tablet (40 mg total) by mouth daily., Disp: 30 tablet, Rfl: 6   torsemide (DEMADEX) 20 MG tablet, TAKE TWO TABLETS BY MOUTH DAILY AS NEEDED FOR swelling, Disp: 180 tablet, Rfl: 3   traMADol (ULTRAM) 50 MG tablet, Take 1-2 tablets (50-100 mg total) by mouth every 12 (twelve) hours as needed for severe pain (ONLY IF  NEEDED!!)., Disp: 60 tablet, Rfl: 0   VASCEPA 1 g capsule, TAKE TWO CAPSULES BY MOUTH TWICE DAILY, Disp: 120 capsule, Rfl: 11   VICTOZA 18 MG/3ML SOPN, INJECT 1.2 MG into THE SKIN DAILY, Disp: 6 mL, Rfl: 0 Social History   Socioeconomic History   Marital status: Married    Spouse name: Barnabas Lister   Number of children: 2   Years of education: 10   Highest education level: Not on file  Occupational History   Not on file  Tobacco Use   Smoking status: Every Day    Packs/day: 0.50  Years: 20.00    Pack years: 10.00    Types: Cigarettes    Start date: 03/18/1990   Smokeless tobacco: Never  Vaping Use   Vaping Use: Former  Substance and Sexual Activity   Alcohol use: Not Currently    Alcohol/week: 0.0 standard drinks    Comment: maybe once a year   Drug use: No   Sexual activity: Yes    Birth control/protection: None    Comment: PCOS  Other Topics Concern   Not on file  Social History Narrative   Unemployed   Applied for disability   Leisure "take care of house and kids"   Walks when able   Social Determinants of Health   Financial Resource Strain: Not on file  Food Insecurity: Not on file  Transportation Needs: Not on file  Physical Activity: Not on file  Stress: Not on file  Social Connections: Not on file  Intimate Partner Violence: Not on file   Family History  Problem Relation Age of Onset   Heart murmur Father    Diabetes Father    Hypertension Father    Hyperlipidemia Father    Cancer Father        throat   Diabetes Mother    Cancer Mother        breast.uterine, ovarian   Asthma Son    Appendicitis Son    Hernia Son    Mental illness Son        Bipolar, personality d/o   Stroke Maternal Grandmother    Cancer Maternal Grandmother        breast   Cancer Paternal Grandfather        lung   Colon cancer Neg Hx     Objective: Office vital signs reviewed.  BP 130/89   Pulse (!) 107   Temp 98 F (36.7 C)   Ht _0  (1.778 m)   Wt (!) 389 lb (176.4  kg)   SpO2 97%   BMI 55.82 kg/m  Physical Examination:  General: Awake, alert, morbidly obese, No acute distress HEENT: Normal, sclera white, smells of tobacco Cardio: Slightly tachycardic with regular rhythm, S1S2 heard, no murmurs appreciated Pulm: clear to auscultation bilaterally, no wheezes, rhonchi or rales; normal work of breathing on room air MSK; ambulating independently  psych: Mood stable  Depression screen [facility] 2/9 06/08/2021 03/06/2021 12/03/2020  Decreased Interest 2 1 0  Down, Depressed, Hopeless 0 0 0  PHQ - 2 Score 2 1 0  Altered sleeping 3 2 0  Tired, decreased energy 2 2 0  Change in appetite 2 2 0  Feeling bad or failure about yourself  1 1 0  Trouble concentrating 2 1 0  Moving slowly or fidgety/restless 0 0 0  Suicidal thoughts 0 0 0  PHQ-9 Score 12 9 0  Difficult doing work/chores Somewhat difficult Not difficult at all Not difficult at all  Some recent data might be hidden   GAD 7 : Generalized Anxiety Score 06/08/2021 03/06/2021 12/03/2020 10/29/2020  Nervous, Anxious, on Edge _1 Control/stop worrying _2 0  Worry too much - different things _3 Trouble relaxing 2 2 0 3  Restless 2 1 0 0  Easily annoyed or irritable _4 Afraid - awful might happen 1 0 0 0  Total GAD 7 Score _5 Anxiety Difficulty Somewhat difficult Somewhat difficult Not difficult at all Somewhat difficult  Assessment/ Plan: 40 y.o. female   Uncontrolled type 2 diabetes mellitus with hyperglycemia ([place]) - Plan: Bayer DCA Hb A1c Waived, liraglutide (VICTOZA) 18 MG/3ML SOPN  Hypertension associated with diabetes ([place])  Hyperlipidemia associated with type 2 diabetes mellitus ([place]) - Plan: LDL Cholesterol, Direct  Elevated liver enzymes - Plan: CMP14+EGFR  Chronic bilateral low back pain with bilateral sciatica - Plan: traMADol (ULTRAM) 50 MG tablet  Diabetic neuropathy, painful (HCC) - Plan: pregabalin (LYRICA) 150 MG capsule  Shortness of  breath  Generalized anxiety disorder  BMI 50.0-59.9, adult ([place])  Personal history of COVID-19  Sugar [place] not at goal but has come down since her last visit.  I am going to place her on a higher dose of Victoza in efforts to bring that sugar down.  She will advance to 1.8 mg subcutaneously daily  Blood pressure is controlled.  Continue current regimen  Check direct LDL.  May need to consider something like Repatha given heart history.  Goal should be 70 or less  Liver enzymes noted to be elevated last visit, may be related to uncontrolled cholesterol and sugar, morbid obesity and possibly fatty liver  Tramadol renewed, Lyrica renewed.  The national narcotic database was reviewed and there were no red flags.  She is up-to-date on drug screen and controlled substance contract  Anxiety and depressive disorder is not well controlled.  I have placed a referral to counseling services as she was not able to connect with someone in [place].  She is currently treated with BuSpar, Prozac at max dose.  Suspect shortness of breath worsened by COVID-19, body habitus.  Awaiting formal review by pulmonology and their recommendations.   No orders of the defined types were placed in this encounter.  No orders of the defined types were placed in this encounter.    Katelyn Norlander, DO [place] [phone]

## 2021-06-09 LAB — CMP14+EGFR
ALT: 52 IU/L — ABNORMAL HIGH (ref 0–32)
AST: 47 IU/L — ABNORMAL HIGH (ref 0–40)
Albumin/Globulin Ratio: 1.3 (ref 1.2–2.2)
Albumin: 3.9 g/dL (ref 3.8–4.8)
Alkaline Phosphatase: 89 IU/L (ref 44–121)
BUN/Creatinine Ratio: 15 (ref 9–23)
BUN: 9 mg/dL (ref 6–24)
Bilirubin Total: 0.4 mg/dL (ref 0.0–1.2)
CO2: 19 mmol/L — ABNORMAL LOW (ref 20–29)
Calcium: 9.3 mg/dL (ref 8.7–10.2)
Chloride: 100 mmol/L (ref 96–106)
Creatinine, Ser: 0.59 mg/dL (ref 0.57–1.00)
Globulin, Total: 3.1 g/dL (ref 1.5–4.5)
Glucose: 240 mg/dL — ABNORMAL HIGH (ref 65–99)
Potassium: 4 mmol/L (ref 3.5–5.2)
Sodium: 136 mmol/L (ref 134–144)
Total Protein: 7 g/dL (ref 6.0–8.5)
eGFR: 115 mL/min/{1.73_m2} (ref >59)

## 2021-06-09 LAB — LDL CHOLESTEROL, DIRECT: LDL Direct: 127 mg/dL — ABNORMAL HIGH (ref 0–99)

## 2021-06-11 ENCOUNTER — Ambulatory Visit: Payer: Medicaid Other | Admitting: Pharmacist

## 2021-06-11 ENCOUNTER — Encounter: Payer: Self-pay | Admitting: *Deleted

## 2021-06-18 ENCOUNTER — Encounter: Payer: Self-pay | Admitting: Internal Medicine

## 2021-06-18 ENCOUNTER — Ambulatory Visit (INDEPENDENT_AMBULATORY_CARE_PROVIDER_SITE_OTHER): Payer: Medicaid Other | Admitting: Internal Medicine

## 2021-06-18 ENCOUNTER — Other Ambulatory Visit: Payer: Self-pay

## 2021-06-18 VITALS — BP 128/78 | HR 94 | Temp 98.6°F | Ht 70.75 in | Wt 392.1 lb

## 2021-06-18 DIAGNOSIS — R0602 Shortness of breath: Secondary | ICD-10-CM

## 2021-06-18 DIAGNOSIS — F17210 Nicotine dependence, cigarettes, uncomplicated: Secondary | ICD-10-CM | POA: Insufficient documentation

## 2021-06-18 DIAGNOSIS — R0609 Other forms of dyspnea: Secondary | ICD-10-CM

## 2021-06-18 DIAGNOSIS — Z23 Encounter for immunization: Secondary | ICD-10-CM

## 2021-06-18 DIAGNOSIS — R0600 Dyspnea, unspecified: Secondary | ICD-10-CM | POA: Diagnosis not present

## 2021-06-18 MED ORDER — BREZTRI AEROSPHERE 160-9-4.8 MCG/ACT IN AERO: 2.0000 | INHALATION_SPRAY | Freq: Two times a day (BID) | RESPIRATORY_TRACT | 0 refills | Status: DC

## 2021-06-18 NOTE — Patient Instructions (Signed)
Plan A = Automatic = Always=    Breztri Take 2 puffs first thing in am and then another 2 puffs about 12 hours later.    Work on inhaler technique:  relax and gently blow all the way out then take a nice smooth full deep breath back in, triggering the inhaler at same time you start breathing in.  Hold for up to 5 seconds if you can. Blow out thru nose. Rinse and gargle with water when done.  If mouth or throat bother you at all,  try brushing teeth/gums/tongue with arm and hammer toothpaste/ make a slurry and gargle and spit out.   Call if you really do better on breztri than the other ones you've used and we'll see if we can get insurance to pay for it or similar drug    Plan B = Backup (to supplement plan A, not to replace it) Only use your albuterol inhaler as a rescue medication to be used if you can't catch your breath by resting or doing a relaxed purse lip breathing pattern.  - The less you use it, the better it will work when you need it. - Ok to use the inhaler up to 2 puffs  every 4 hours if you must but call for appointment if use goes up over your usual need - Don't leave home without it !!  (think of it like the spare tire for your car)   The key is to stop smoking completely before smoking completely stops you!   Please schedule a follow up office visit in 6-8 weeks with pfts

## 2021-06-18 NOTE — Assessment & Plan Note (Signed)
Active smoker - PFTs  11/08/17 nl x for ERV 44% at wt 386 - 06/18/2021  @ wt 302   Walked on RA x  1  lap(s) =  approx 177ft @ slow  pace, stopped due to sob/legs have out  with lowest 02 sats 97% - repeated with same symptoms, sats 96%  - 06/18/2021  After extensive coaching inhaler device,  effectiveness =    75% (short ti) trial of breztri x 1 week sample and f/u with full pfts   Symptoms are markedly disproportionate to objective findings and not clear to what extent this is actually a pulmonary  problem but pt does appear to have difficult to sort out respiratory symptoms of unknown origin for which  DDX  = almost all start with A and  include Adherence, Ace Inhibitors, Acid Reflux, Active Sinus Disease, Alpha 1 Antitripsin deficiency, Anxiety masquerading as Airways dz,  ABPA,  Allergy(esp in young), Aspiration (esp in elderly), Adverse effects of meds,  Active smoking or Vaping, A bunch of PE's/clot burden (a few small clots can't cause this syndrome unless there is already severe underlying pulm or vascular dz with poor reserve),  Anemia or thyroid disorder, plus two Bs  = Bronchiectasis and Beta blocker use..and one C= CHF     Adherence is always the initial "prime suspect" and is a multilayered concern that requires a "trust but verify" approach in every patient - starting with knowing how to use medications, especially inhalers, correctly, keeping up with refills and understanding the fundamental difference between maintenance and prns vs those medications only taken for a very short course and then stopped and not refilled.  - see hfa teaching   Active smoking - see sep a/p  ? Allergy / asthma > try breztri 2 bid and approp saba: I spent extra time with pt today reviewing appropriate use of albuterol for prn use on exertion with the following points: 1) saba is for relief of sob that does not improve by walking a slower pace or resting but rather if the pt does not improve after trying this  first. 2) If the pt is convinced, as many are, that saba helps recover from activity faster then it's easy to tell if this is the case by re-challenging : ie stop, take the inhaler, then p 5 minutes try the exact same activity (intensity of workload) that just caused the symptoms and see if they are substantially diminished or not after saba 3) if there is an activity that reproducibly causes the symptoms, try the saba 15 min before the activity on alternate days   If in fact the saba really does help, then fine to continue to use it prn but advised may need to look closer at the maintenance regimen being used to achieve better control of airways disease with exertion.   ? ? Acid (or non-acid) GERD > always difficult to exclude as up to 75% of pts in some series report no assoc GI/ Heartburn symptoms> rec continue max (24h)  acid suppression and diet restrictions/ reviewed     ? Anemia/thyoid dz   Lab Results  Component Value Date   HGB 14.6 12/03/2020   HGB 13.4 10/24/2020   HGB 12.7 05/18/2020   HGB 12.6 01/15/2020   HGB 14.2 06/05/2019    Lab Results  Component Value Date   TSH 0.608 04/08/2020      ? BB effects > await pfts   ? chf > last LHC 01/14/20 with CAD  but Preserved LVEF and normal EDP.  >> return with pfts

## 2021-06-18 NOTE — Assessment & Plan Note (Signed)
Body mass index is 55.07 kg/m.   Lab Results  Component Value Date   TSH 0.608 04/08/2020      Contributing to doe and risk of GERD >>>   reviewed the need and the process to achieve and maintain neg calorie balance > defer f/u primary care including intermittently monitoring thyroid status

## 2021-06-18 NOTE — Assessment & Plan Note (Signed)
Counseled re importance of smoking cessation but did not meet time criteria for separate billing    Each maintenance medication was reviewed in detail including emphasizing most importantly the difference between maintenance and prns and under what circumstances the prns are to be triggered using an action plan format where appropriate.  Total time for H and P, chart review, counseling, reviewing hfa  device(s) , directly observing portions of ambulatory 02 saturation study/ and generating customized AVS unique to this office visit / same day charting = 47 min

## 2021-06-18 NOTE — Progress Notes (Signed)
[name], female    DOB: Aug 16, 1981, 40 y.o.   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 Approx. 2 months. Pt states its the worst after walking or activity/exertion. Laying on back makes it hard to breathe as well. Cough isnt normally productive but pt says she has not had a lot of mucus- Clear/yellow/brown.   Baseline = April 2022 at wt present 425 lb > used HC parking due back/ neuropathy could do shopping pushing cart  Dyspnea:  50 ft flt since July 2022 covid  / better if uses saba  Cough: variable / miminal mucoid  Sleep: props [place] bunch of pillows x years  SABA use: some better p rx but doesn't really pre or rechallenge  No obvious day to day or daytime variability or assoc excess/ purulent sputum or mucus plugs or hemoptysis or cp or chest tightness, subjective wheeze or overt sinus or hb symptoms.   Sleeping as above  without nocturnal  or early am exacerbation  of respiratory  c/o's or need for noct saba. Also denies any obvious fluctuation of symptoms with weather or environmental changes or other aggravating or alleviating factors except as outlined above   No unusual exposure hx or h/o childhood pna/ asthma or knowledge of premature birth.  Current Allergies, Complete Past Medical History, Past Surgical History, Family History, and Social History were reviewed in [employer] record.  ROS  The following are not active complaints unless bolded Hoarseness, sore throat, dysphagia, dental problems, itching, sneezing,  nasal congestion or discharge of excess mucus or purulent secretions, ear ache,   fever, chills, sweats, unintended wt loss or wt gain,  classically pleuritic or exertional cp,  orthopnea pnd or arm/hand swelling  or leg swelling, presyncope, palpitations, abdominal pain, anorexia, nausea, vomiting, diarrhea  or change in bowel habits or change in bladder habits, change in stools or change in urine, dysuria, hematuria,  rash, arthralgias, visual complaints, headache, numbness, weakness or ataxia or problems with walking or coordination,  change in mood or  memory.           Past Medical History:  Diagnosis Date   Allergy    Anemia    Anginal pain (HCC)    Anxiety    CHF (congestive heart failure) ([place])    Chronic kidney disease    history of kidney stones   Coronary artery disease 09/2011   a.s/p BMS to LAD and angioplasty of RI in 09/2011 b. patent stent by cath in 02/2012, 12/2013, and 02/2016 with most recent showing 30-40% dLAD and 30-40% RCA stenosis   Depression    Diabetes mellitus    Dyspnea    Hyperlipidemia    Hypertension    Midsternal chest pain 03/03/2012   Migraines    Morbid obesity ([place])    Myocardial infarct ([place]) 09/2011   Myocardial infarction ([place]) 02/2016   Neuromuscular disorder ([place])    Palpitations 02/17/2012   Ureteral stone with hydronephrosis    Urinary tract obstruction due to kidney stone 02/15/2017    Outpatient Medications Prior to Visit  Medication Sig Dispense Refill   ACCU-CHEK AVIVA PLUS test  strip USE TO TEST BLOOD SUGAR FOUR TIMES DAILY 400 strip 3   Accu-Chek Softclix Lancets lancets BOOD SUGAR FOUR TIMES DAILY Dx E11.9 100 each 11   albuterol (VENTOLIN HFA) 108 (90 Base) MCG/ACT inhaler Inhale 2 puffs into the lungs every 6 (six) hours as needed for wheezing or shortness of breath. 8 g 0   aspirin 81 MG chewable tablet Chew 81 mg by mouth daily.     blood glucose meter kit and supplies Dispense based on patient and insurance preference. Use up to four times daily as directed. (FOR ICD-10:  E11.22). Check BG daily 1 each 0   busPIRone (BUSPAR) 10 MG tablet Take 1 tablet (10 mg  total) by mouth 2 (two) times daily. 60 tablet 12   carvedilol (COREG) 25 MG tablet TAKE 1 TABLET BY MOUTH TWICE DAILY WITH A MEAL 180 tablet 1   clopidogrel (PLAVIX) 75 MG tablet TAKE 1 TABLET BY MOUTH EVERY DAY 94 tablet 3   colestipol (COLESTID) 1 g tablet Take 2 tablets (2 g total) by mouth daily. Do not take within 4 hours of other medications. 60 tablet 3   empagliflozin (JARDIANCE) 25 MG TABS tablet Take 1 tablet (25 mg total) by mouth daily before breakfast. 30 tablet 12   fenofibrate (TRICOR) 48 MG tablet Take 1 tablet (48 mg total) by mouth daily. 30 tablet 5   FLUoxetine (PROZAC) 20 MG capsule TAKE THREE CAPSULES BY MOUTH DAILY 90 capsule 0   Insulin Pen Needle (GLOBAL EASE INJECT PEN NEEDLES) 31G X 5 MM MISC USE ONCE EVERY DAY 100 each 3   lidocaine (LIDODERM) 5 % Place 1 patch onto the skin daily. Remove & Discard patch within 12 hours or as directed by MD. NOT a candidate for NSAIDs due to MI/ CAD. 30 patch prn   liraglutide (VICTOZA) 18 MG/3ML SOPN INJECT 1.8 MG into THE SKIN DAILY 9 mL 3   losartan (COZAAR) 25 MG tablet TAKE 1 TABLET BY MOUTH DAILY 90 tablet 1   medroxyPROGESTERone (PROVERA) 5 MG tablet Take 1 tablet (5 mg total) by mouth daily. NOV with GYN 90 tablet 0   metFORMIN (GLUCOPHAGE) 1000 MG tablet Take 1 tablet (1,000 mg total) by mouth 2 (two) times daily. 60 tablet 2   nitroGLYCERIN (NITROSTAT) 0.4 MG SL tablet DISSOLVE 1 TABLET UNDER THE TONGUE EVERY 5 MINUTES AS NEEDED FOR CHEST PAIN. DO NOT EXCEED A TOTAL OF 3 DOSES IN 15 MINUTES. (Patient taking differently: Place 0.4 mg under the tongue every 5 (five) minutes as needed for chest pain.) 25 tablet 3   pantoprazole (PROTONIX) 40 MG tablet TAKE 1 TABLET BY MOUTH DAILY 30 tablet 0   potassium chloride SA (KLOR-CON) 20 MEQ tablet TAKE 1 TABLET BY MOUTH EVERY DAY (Patient taking differently: Take 20 mEq by mouth daily as needed (when taking Torsemide).) 90 tablet 3   pregabalin (LYRICA) 150 MG capsule Take 1 capsule (150  mg total) by mouth 2 (two) times daily. 60 capsule 3   ranolazine (RANEXA) 1000 MG SR tablet TAKE 1 TABLET BY MOUTH TWICE DAILY 180 tablet 3   rosuvastatin (CRESTOR) 40 MG tablet Take 1 tablet (40 mg total) by mouth daily. 30 tablet 6   torsemide (DEMADEX) 20 MG tablet TAKE TWO TABLETS BY MOUTH DAILY AS NEEDED FOR swelling 180 tablet 3   traMADol (ULTRAM) 50 MG tablet Take 1-2 tablets (50-100 mg total) by mouth every 12 (twelve) hours as needed for severe pain (ONLY IF NEEDED!!). 60 tablet  3   VASCEPA 1 g capsule TAKE TWO CAPSULES BY MOUTH TWICE DAILY 120 capsule 11   ezetimibe (ZETIA) 10 MG tablet Take 1 tablet (10 mg total) by mouth daily. 90 tablet 3   No facility-administered medications prior to visit.     Objective:     BP 128/78 (BP Location: Left Wrist, Patient Position: Sitting)   Pulse 94   Temp 98.6 F (37 C) (Temporal)   Ht 5' 10.75" (1.797 m)   Wt (!) 392 lb 1.3 oz (177.8 kg)   SpO2 96% Comment: ra  BMI 55.07 kg/m   SpO2: 96 % (ra) RA  Amb MO wf nad    HEENT : pt wearing mask not removed for exam due to covid -19 concerns.    NECK :  without JVD/Nodes/TM/ nl carotid upstrokes bilaterally   LUNGS: no acc muscle use,  Nl contour chest which is clear to A and P bilaterally without cough on insp or exp maneuvers   CV:  RRR  no s3 or murmur or increase in P2, and no edema   ABD:  soft and nontender with nl inspiratory excursion in the supine position. No bruits or organomegaly appreciated, bowel sounds nl  MS:  Nl gait/ ext warm without deformities, calf tenderness, cyanosis or clubbing No obvious joint restrictions   SKIN: warm and dry without lesions    NEURO:  alert, approp, nl sensorium with  no motor or cerebellar deficits apparent.    I personally reviewed images and agree with radiology impression as follows:  CXR:   05/14/21  Low lung volumes with borderline cardiomegaly and no evidence of acute cardiopulmonary disease    Assessment   DOE  (dyspnea on exertion) Active smoker - PFTs  11/08/17 nl x for ERV 44% at wt 386 - 06/18/2021  @ wt 302   Walked on RA x  1  lap(s) =  approx 165f @ slow  pace, stopped due to sob/legs have out  with lowest 02 sats 97% - repeated with same symptoms, sats 96%  - 06/18/2021  After extensive coaching inhaler device,  effectiveness =    75% (short ti) trial of breztri x 1 week sample and f/u with full pfts   Symptoms are markedly disproportionate to objective findings and not clear to what extent this is actually a pulmonary  problem but pt does appear to have difficult to sort out respiratory symptoms of unknown origin for which  DDX  = almost all start with A and  include Adherence, Ace Inhibitors, Acid Reflux, Active Sinus Disease, Alpha 1 Antitripsin deficiency, Anxiety masquerading as Airways dz,  ABPA,  Allergy(esp in young), Aspiration (esp in elderly), Adverse effects of meds,  Active smoking or Vaping, A bunch of PE's/clot burden (a few small clots can't cause this syndrome unless there is already severe underlying pulm or vascular dz with poor reserve),  Anemia or thyroid disorder, plus two Bs  = Bronchiectasis and Beta blocker use..and one C= CHF     Adherence is always the initial "prime suspect" and is a multilayered concern that requires a "trust but verify" approach in every patient - starting with knowing how to use medications, especially inhalers, correctly, keeping up with refills and understanding the fundamental difference between maintenance and prns vs those medications only taken for a very short course and then stopped and not refilled.  - see hfa teaching   Active smoking - see sep a/p  ? Allergy / asthma > try breztri  2 bid and approp saba: I spent extra time with pt today reviewing appropriate use of albuterol for prn use on exertion with the following points: 1) saba is for relief of sob that does not improve by walking a slower pace or resting but rather if the pt does not  improve after trying this first. 2) If the pt is convinced, as many are, that saba helps recover from activity faster then it's easy to tell if this is the case by re-challenging : ie stop, take the inhaler, then p 5 minutes try the exact same activity (intensity of workload) that just caused the symptoms and see if they are substantially diminished or not after saba 3) if there is an activity that reproducibly causes the symptoms, try the saba 15 min before the activity on alternate days   If in fact the saba really does help, then fine to continue to use it prn but advised may need to look closer at the maintenance regimen being used to achieve better control of airways disease with exertion.   ? ? Acid (or non-acid) GERD > always difficult to exclude as up to 75% of pts in some series report no assoc GI/ Heartburn symptoms> rec continue max (24h)  acid suppression and diet restrictions/ reviewed     ? Anemia/thyoid dz   Lab Results  Component Value Date   HGB 14.6 12/03/2020   HGB 13.4 10/24/2020   HGB 12.7 05/18/2020   HGB 12.6 01/15/2020   HGB 14.2 06/05/2019    Lab Results  Component Value Date   TSH 0.608 04/08/2020      ? BB effects > await pfts   ? chf > last LHC 01/14/20 with CAD but Preserved LVEF and normal EDP.  >> return with pfts     Morbid obesity (HCC) Body mass index is 55.07 kg/m.   Lab Results  Component Value Date   TSH 0.608 04/08/2020      Contributing to doe and risk of GERD >>>   reviewed the need and the process to achieve and maintain neg calorie balance > defer f/u primary care including intermittently monitoring thyroid status       Cigarette smoker Counseled re importance of smoking cessation but did not meet time criteria for separate billing    Each maintenance medication was reviewed in detail including emphasizing most importantly the difference between maintenance and prns and under what circumstances the prns are to be triggered  using an action plan format where appropriate.  Total time for H and P, chart review, counseling, reviewing hfa  device(s) , directly observing portions of ambulatory 02 saturation study/ and generating customized AVS unique to this office visit / same day charting = 47 min                  Christinia Gully, MD 06/18/2021

## 2021-06-23 ENCOUNTER — Ambulatory Visit (INDEPENDENT_AMBULATORY_CARE_PROVIDER_SITE_OTHER): Payer: Medicaid Other | Admitting: Pharmacist

## 2021-06-23 DIAGNOSIS — E7800 Pure hypercholesterolemia, unspecified: Secondary | ICD-10-CM | POA: Diagnosis not present

## 2021-06-23 DIAGNOSIS — I214 Non-ST elevation (NSTEMI) myocardial infarction: Secondary | ICD-10-CM | POA: Diagnosis not present

## 2021-06-23 MED ORDER — REPATHA SURECLICK 140 MG/ML ~~LOC~~ SOAJ: 140.0000 mg | SUBCUTANEOUS | 6 refills | Status: DC

## 2021-06-23 NOTE — Progress Notes (Signed)
06/23/2021 Name: [name] MRN: [id number] DOB: [birth year]  HPI:  [name] is a 40 y.o. female patient referred to lipid clinic by PCP. PMH is significant for: Past Medical History:  Diagnosis Date   Allergy    Anemia    Anginal pain ([place])    Anxiety    CHF (congestive heart failure) (HCC)    Chronic kidney disease    history of kidney stones   Coronary artery disease 09/2011   a.s/p BMS to LAD and angioplasty of RI in 09/2011 b. patent stent by cath in 02/2012, 12/2013, and 02/2016 with most recent showing 30-40% dLAD and 30-40% RCA stenosis   Depression    Diabetes mellitus    Dyspnea    Hyperlipidemia    Hypertension    Midsternal chest pain 03/03/2012   Migraines    Morbid obesity ([place])    Myocardial infarct ([place]) 09/2011   Myocardial infarction ([place]) 02/2016   Neuromuscular disorder ([place])    Palpitations 02/17/2012   Ureteral stone with hydronephrosis    Urinary tract obstruction due to kidney stone 02/15/2017    Intolerances: n/a--maxed on hyperlipidemia  Risk Factors: see factors in cardiac note from 02/19/2021--> CAD, MI x3, CHF, obesity, familial  - prior anterior MI 09/2011, 95% lesion in mid LAD and ramus with 95% ostial stenosis. LVEF 55% by LV gram, apical hypokinesis. BMS to mid LAD and cutting balloon angioplasty of ramus.   - repeat cath 02/2012 with patent vessels and stent - 09/2011 Echo: LVEF 50% - 12/2013 Lexiscan large anteroseptal ischemia - cath 12/2013 with patent vessels  - cath 02/2016 [facility] in setting of NSTEMI showed LM patent, LAD patent with patent stent, distal LAD 30-40%, LCX patent, RCA 30-40%. LVEDP 40, PCWP 30 mean PA 32, CI 4.93 - echo 02/2016 [place] LVEF 50%, apical akinesis   LDL goal: <70   Diet: needs to follow low fat diet, increase fiber  Exercise: n/a  Labs: Lipid Panel     Component Value Date/Time   CHOL 167 03/06/2021 1111   TRIG 170 (H) 03/06/2021 1111   HDL 34 (L) 03/06/2021 1111   CHOLHDL 4.9 (H)  03/06/2021 1111   CHOLHDL 4.6 10/24/2020 0900   VLDL 31 10/24/2020 0900   LDLCALC 103 (H) 03/06/2021 1111   LDLCALC 122 (H) 10/28/2017 1221   LDLDIRECT 127 (H) 06/08/2021 0828   LABVLDL 30 03/06/2021 1111      Current Outpatient Medications on File Prior to Visit  Medication Sig Dispense Refill   ACCU-CHEK AVIVA PLUS test strip USE TO TEST BLOOD SUGAR FOUR TIMES DAILY 400 strip 3   Accu-Chek Softclix Lancets lancets BOOD SUGAR FOUR TIMES DAILY Dx E11.9 100 each 11   albuterol (VENTOLIN HFA) 108 (90 Base) MCG/ACT inhaler Inhale 2 puffs into the lungs every 6 (six) hours as needed for wheezing or shortness of breath. 8 g 0   aspirin 81 MG chewable tablet Chew 81 mg by mouth daily.     blood glucose meter kit and supplies Dispense based on patient and insurance preference. Use up to four times daily as directed. (FOR ICD-10:  E11.22). Check BG daily 1 each 0   Budeson-Glycopyrrol-Formoterol (BREZTRI AEROSPHERE) 160-9-4.8 MCG/ACT AERO Inhale 2 puffs into the lungs in the morning and at bedtime. 10.7 g 0   busPIRone (BUSPAR) 10 MG tablet Take 1 tablet (10 mg total) by mouth 2 (two) times daily. 60 tablet 12   carvedilol (COREG) 25 MG tablet TAKE 1 TABLET  BY MOUTH TWICE DAILY WITH A MEAL 180 tablet 1   clopidogrel (PLAVIX) 75 MG tablet TAKE 1 TABLET BY MOUTH EVERY DAY 94 tablet 3   colestipol (COLESTID) 1 g tablet Take 2 tablets (2 g total) by mouth daily. Do not take within 4 hours of other medications. 60 tablet 3   empagliflozin (JARDIANCE) 25 MG TABS tablet Take 1 tablet (25 mg total) by mouth daily before breakfast. 30 tablet 12   fenofibrate (TRICOR) 48 MG tablet Take 1 tablet (48 mg total) by mouth daily. 30 tablet 5   FLUoxetine (PROZAC) 20 MG capsule TAKE THREE CAPSULES BY MOUTH DAILY 90 capsule 0   Insulin Pen Needle (GLOBAL EASE INJECT PEN NEEDLES) 31G X 5 MM MISC USE ONCE EVERY DAY 100 each 3   lidocaine (LIDODERM) 5 % Place 1 patch onto the skin daily. Remove & Discard patch within  12 hours or as directed by MD. NOT a candidate for NSAIDs due to MI/ CAD. 30 patch prn   liraglutide (VICTOZA) 18 MG/3ML SOPN INJECT 1.8 MG into THE SKIN DAILY 9 mL 3   losartan (COZAAR) 25 MG tablet TAKE 1 TABLET BY MOUTH DAILY 90 tablet 1   medroxyPROGESTERone (PROVERA) 5 MG tablet Take 1 tablet (5 mg total) by mouth daily. NOV with GYN 90 tablet 0   metFORMIN (GLUCOPHAGE) 1000 MG tablet Take 1 tablet (1,000 mg total) by mouth 2 (two) times daily. 60 tablet 2   nitroGLYCERIN (NITROSTAT) 0.4 MG SL tablet DISSOLVE 1 TABLET UNDER THE TONGUE EVERY 5 MINUTES AS NEEDED FOR CHEST PAIN. DO NOT EXCEED A TOTAL OF 3 DOSES IN 15 MINUTES. (Patient taking differently: Place 0.4 mg under the tongue every 5 (five) minutes as needed for chest pain.) 25 tablet 3   pantoprazole (PROTONIX) 40 MG tablet TAKE 1 TABLET BY MOUTH DAILY 30 tablet 0   potassium chloride SA (KLOR-CON) 20 MEQ tablet TAKE 1 TABLET BY MOUTH EVERY DAY (Patient taking differently: Take 20 mEq by mouth daily as needed (when taking Torsemide).) 90 tablet 3   pregabalin (LYRICA) 150 MG capsule Take 1 capsule (150 mg total) by mouth 2 (two) times daily. 60 capsule 3   ranolazine (RANEXA) 1000 MG SR tablet TAKE 1 TABLET BY MOUTH TWICE DAILY 180 tablet 3   rosuvastatin (CRESTOR) 40 MG tablet Take 1 tablet (40 mg total) by mouth daily. 30 tablet 6   torsemide (DEMADEX) 20 MG tablet TAKE TWO TABLETS BY MOUTH DAILY AS NEEDED FOR swelling 180 tablet 3   traMADol (ULTRAM) 50 MG tablet Take 1-2 tablets (50-100 mg total) by mouth every 12 (twelve) hours as needed for severe pain (ONLY IF NEEDED!!). 60 tablet 3   VASCEPA 1 g capsule TAKE TWO CAPSULES BY MOUTH TWICE DAILY 120 capsule 11   No current facility-administered medications on file prior to visit.       Allergies  Allergen Reactions   Bee Venom Anaphylaxis and Swelling   Latex Itching   Sulfa Drugs Cross Reactors Nausea And Vomiting     Assessment/Plan:   1. Hyperlipidemia - Cardiologist  recommends adding PCSK9, lipid panel trending up, significant cardiac history (3 MIs)--will send in Repatha 145m every 14 days (education/counseling provided) Continue all other therapy for hyperlipidemia-patient on crestor, fibrate, vascepa  Patient to get blood work every 3 months   JRegina Eck PharmD, BCPS Clinical Pharmacist, [place] Phone [phone]

## 2021-06-23 NOTE — Telephone Encounter (Signed)
"  The breztri seems to be working I am having more  productive coughs and am able to do more activity"  FYI Dr. [name]

## 2021-06-25 ENCOUNTER — Telehealth: Payer: Self-pay | Admitting: *Deleted

## 2021-06-25 NOTE — Telephone Encounter (Signed)
Repatha 140 PA started Office notes and labs attached  (Key: [id number])  Sent to plan

## 2021-06-28 ENCOUNTER — Other Ambulatory Visit: Payer: Self-pay | Admitting: Family Medicine

## 2021-06-28 DIAGNOSIS — E1165 Type 2 diabetes mellitus with hyperglycemia: Secondary | ICD-10-CM

## 2021-06-30 ENCOUNTER — Other Ambulatory Visit: Payer: Self-pay

## 2021-06-30 ENCOUNTER — Telehealth: Payer: Self-pay | Admitting: Internal Medicine

## 2021-06-30 MED ORDER — BREZTRI AEROSPHERE 160-9-4.8 MCG/ACT IN AERO: 2.0000 | INHALATION_SPRAY | Freq: Two times a day (BID) | RESPIRATORY_TRACT | 0 refills | Status: DC

## 2021-06-30 MED ORDER — BREZTRI AEROSPHERE 160-9-4.8 MCG/ACT IN AERO: 2.0000 | INHALATION_SPRAY | Freq: Two times a day (BID) | RESPIRATORY_TRACT | 6 refills | Status: DC

## 2021-06-30 NOTE — Telephone Encounter (Signed)
Called and spoke to pt's husband (per DPR). Told him we would send in a refill and pt could come pick up sample of breztri tomorrow from 8-5 at the [place] office. Voiced understanding. Nothing further needed at this time.

## 2021-07-06 ENCOUNTER — Other Ambulatory Visit: Payer: Self-pay | Admitting: Family Medicine

## 2021-07-06 DIAGNOSIS — F411 Generalized anxiety disorder: Secondary | ICD-10-CM

## 2021-07-06 NOTE — Telephone Encounter (Signed)
Message from Plan PA Case: [id number], Status: Approved, Coverage Starts on: 06/25/2021 12:00:00 AM, Coverage Ends on: 06/25/2022 12:00:00 AM  Pharmacy and patient aware.

## 2021-07-13 ENCOUNTER — Telehealth: Payer: Self-pay | Admitting: Pharmacy Technician

## 2021-07-13 NOTE — Telephone Encounter (Signed)
Patient Advocate Encounter  Received notification from [employer] that prior authorization for BREZTRI is required.   PA submitted on 10.17.22 Key [id number] Status is pending   [facility] will continue to follow  Ricke Hey, CPhT Patient Advocate [facility] Phone: [phone] Fax:  [phone]

## 2021-07-14 NOTE — Telephone Encounter (Signed)
Symbicort 160 2bid  

## 2021-07-14 NOTE — Telephone Encounter (Signed)
MW please advise.  Thanks  Received a fax regarding Prior Authorization from [employer] for BREZTRI. Authorization has been DENIED because PT MUST HAVE TRIED AND/OR FAILED TWO OF THE FOLLOWING INHALERS: ADVAIR DISKUS ADVAIR HFA DULERA SYMBICORT

## 2021-07-14 NOTE — Telephone Encounter (Signed)
Received a fax regarding Prior Authorization from [employer] for BREZTRI. Authorization has been DENIED because PT MUST HAVE TRIED AND/OR FAILED TWO OF THE FOLLOWING INHALERS: ADVAIR DISKUS ADVAIR HFA DULERA SYMBICORT

## 2021-07-15 MED ORDER — BUDESONIDE-FORMOTEROL FUMARATE 160-4.5 MCG/ACT IN AERO: 2.0000 | INHALATION_SPRAY | Freq: Two times a day (BID) | RESPIRATORY_TRACT | 6 refills | Status: DC

## 2021-07-15 NOTE — Telephone Encounter (Signed)
I have sent the Symbicort 160 to the pharmacy.  Nothing further is needed.

## 2021-07-15 NOTE — Addendum Note (Signed)
Addended by: Marcellus Scott on: 07/15/2021 09:02 AM   Modules accepted: Orders

## 2021-07-29 ENCOUNTER — Other Ambulatory Visit (HOSPITAL_COMMUNITY): Payer: Self-pay

## 2021-07-29 ENCOUNTER — Telehealth: Payer: Self-pay | Admitting: Internal Medicine

## 2021-07-29 NOTE — Telephone Encounter (Signed)
Noted.   Will route message to PA team.

## 2021-07-29 NOTE — Telephone Encounter (Signed)
Please see encounter note from 10.17.22. Pt was switched to Symbicort. It had not been filled at the pharmacy until yesterday. Called and spoke with pharmacy and they have made notes in pt profile for insurance requirements. They are filling for $4 co-pay. Called pt, spoke with pt husband and pt. This will be first time taking Symbicort as they were not aware of insurance requirements. Explained to pt husband to communicate with office how the Symbicort has been working for her and/or effective and will follow next steps in medication step therapy. See 10.17 encounter.

## 2021-08-10 ENCOUNTER — Other Ambulatory Visit (HOSPITAL_COMMUNITY)
Admission: RE | Admit: 2021-08-10 | Discharge: 2021-08-10 | Disposition: A | Discharge status: Home / Self Care | Payer: Medicaid Other | Source: Ambulatory Visit | Attending: Internal Medicine | Admitting: Internal Medicine

## 2021-08-10 ENCOUNTER — Other Ambulatory Visit: Payer: Self-pay

## 2021-08-10 DIAGNOSIS — Z20822 Contact with and (suspected) exposure to covid-19: Secondary | ICD-10-CM | POA: Insufficient documentation

## 2021-08-10 DIAGNOSIS — R142 Eructation: Secondary | ICD-10-CM

## 2021-08-10 DIAGNOSIS — R0609 Other forms of dyspnea: Secondary | ICD-10-CM | POA: Diagnosis not present

## 2021-08-11 LAB — SARS CORONAVIRUS 2 (TAT 6-24 HRS): SARS Coronavirus 2: NEGATIVE

## 2021-08-12 ENCOUNTER — Other Ambulatory Visit: Payer: Self-pay

## 2021-08-12 ENCOUNTER — Ambulatory Visit (HOSPITAL_COMMUNITY)
Admission: RE | Admit: 2021-08-12 | Discharge: 2021-08-12 | Disposition: A | Discharge status: Home / Self Care | Payer: Medicaid Other | Source: Ambulatory Visit | Attending: Internal Medicine | Admitting: Internal Medicine

## 2021-08-12 DIAGNOSIS — R0602 Shortness of breath: Secondary | ICD-10-CM | POA: Diagnosis not present

## 2021-08-12 LAB — PULMONARY FUNCTION TEST
DL/VA % pred: 117 %
DL/VA: 4.97 ml/min/mmHg/L
DLCO unc % pred: 62 %
DLCO unc: 16.55 ml/min/mmHg
FEF 25-75 Post: 3.72 L/sec
FEF 25-75 Pre: 1.61 L/sec
FEF2575-%Change-Post: 131 %
FEF2575-%Pred-Post: 108 %
FEF2575-%Pred-Pre: 47 %
FEV1-%Change-Post: 79 %
FEV1-%Pred-Post: 92 %
FEV1-%Pred-Pre: 51 %
FEV1-Post: 3.3 L
FEV1-Pre: 1.84 L
FEV1FVC-%Change-Post: 2 %
FEV1FVC-%Pred-Pre: 94 %
FEV6-%Change-Post: 74 %
FEV6-%Pred-Post: 95 %
FEV6-%Pred-Pre: 54 %
FEV6-Post: 4.15 L
FEV6-Pre: 2.38 L
FEV6FVC-%Pred-Post: 102 %
FEV6FVC-%Pred-Pre: 102 %
FVC-%Change-Post: 74 %
FVC-%Pred-Post: 93 %
FVC-%Pred-Pre: 53 %
FVC-Post: 4.15 L
FVC-Pre: 2.38 L
Post FEV1/FVC ratio: 79 %
Post FEV6/FVC ratio: 100 %
Pre FEV1/FVC ratio: 77 %
Pre FEV6/FVC Ratio: 100 %

## 2021-08-12 MED ORDER — ALBUTEROL SULFATE (2.5 MG/3ML) 0.083% IN NEBU
2.5000 mg | INHALATION_SOLUTION | Freq: Once | RESPIRATORY_TRACT | Status: AC
  Administered 2021-08-12: 2.5 mg via RESPIRATORY_TRACT

## 2021-08-17 ENCOUNTER — Ambulatory Visit (INDEPENDENT_AMBULATORY_CARE_PROVIDER_SITE_OTHER): Payer: Medicaid Other | Admitting: Internal Medicine

## 2021-08-17 ENCOUNTER — Other Ambulatory Visit: Payer: Self-pay | Admitting: Family Medicine

## 2021-08-17 ENCOUNTER — Encounter: Payer: Self-pay | Admitting: Internal Medicine

## 2021-08-17 ENCOUNTER — Other Ambulatory Visit: Payer: Self-pay

## 2021-08-17 DIAGNOSIS — R142 Eructation: Secondary | ICD-10-CM

## 2021-08-17 DIAGNOSIS — J4489 Other specified chronic obstructive pulmonary disease: Secondary | ICD-10-CM

## 2021-08-17 DIAGNOSIS — R0609 Other forms of dyspnea: Secondary | ICD-10-CM | POA: Diagnosis not present

## 2021-08-17 DIAGNOSIS — J449 Chronic obstructive pulmonary disease, unspecified: Secondary | ICD-10-CM | POA: Diagnosis not present

## 2021-08-17 DIAGNOSIS — F411 Generalized anxiety disorder: Secondary | ICD-10-CM

## 2021-08-17 DIAGNOSIS — I10 Essential (primary) hypertension: Secondary | ICD-10-CM | POA: Diagnosis not present

## 2021-08-17 DIAGNOSIS — R110 Nausea: Secondary | ICD-10-CM

## 2021-08-17 DIAGNOSIS — R0602 Shortness of breath: Secondary | ICD-10-CM | POA: Diagnosis not present

## 2021-08-17 HISTORY — DX: Chronic obstructive pulmonary disease, unspecified: J44.9

## 2021-08-17 HISTORY — DX: Other specified chronic obstructive pulmonary disease: J44.89

## 2021-08-17 MED ORDER — BUDESONIDE-FORMOTEROL FUMARATE 160-4.5 MCG/ACT IN AERO: INHALATION_SPRAY | RESPIRATORY_TRACT | 11 refills | Status: DC

## 2021-08-17 MED ORDER — BISOPROLOL FUMARATE 5 MG PO TABS: ORAL_TABLET | ORAL | 11 refills | Status: DC

## 2021-08-17 MED ORDER — ALBUTEROL SULFATE HFA 108 (90 BASE) MCG/ACT IN AERS: 2.0000 | INHALATION_SPRAY | RESPIRATORY_TRACT | 5 refills | Status: DC | PRN

## 2021-08-17 NOTE — Progress Notes (Signed)
[name], female    DOB: 1981-03-31,     MRN: [id number]   Brief patient profile:  53 yowf active smoker(cigs to vapes 05/2021)   referred to pulmonary clinic in [place]  06/18/2021 by Dr  Harl Bowie for sob s/p covid July 2022      History of Present Illness  06/18/2021  Pulmonary/ 1st office eval/ [name] / [facility]  Chief Complaint  Patient presents with   Consult    Lingering Sob after covid. Approx. 2 months. Pt states its the worst after walking or activity/exertion. Laying on back makes it hard to breathe as well. Cough isnt normally productive but pt says she has not had a lot of mucus- Clear/yellow/brown.   Baseline = April 2022 at wt present 425 lb > used HC parking due back/ neuropathy could do shopping pushing cart  Dyspnea:  50 ft flt since July 2022 covid  / better if uses saba  Cough: variable / miminal mucoid  Sleep: props [place] bunch of pillows x years  SABA use: some better p rx but doesn't really pre or rechallenge Rec Plan A = Automatic = Always=    Breztri Take 2 puffs first thing in am and then another 2 puffs about 12 hours later.   Work on inhaler technique:   Call if you really do better on breztri than the other ones you've used and we'll see if we can get insurance to pay for it or similar drug  Plan B = Backup (to supplement plan A, not to replace it) Only use your albuterol inhaler as a rescue medication  The key is to stop smoking completely before smoking completely stops you!   PFTs nl ? 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   Sleeping as above without nocturnal  or early am exacerbation  of respiratory  c/o's or need for noct saba. Also denies any obvious fluctuation of symptoms with weather or environmental changes or other aggravating or alleviating factors except as outlined above   No unusual exposure hx or h/o childhood pna/ asthma or knowledge of premature birth.  Current Allergies, Complete Past Medical History, Past Surgical History, Family History, and Social History were reviewed in [employer] record.  ROS  The following are not active complaints unless bolded Hoarseness, sore throat, dysphagia, dental problems, itching, sneezing,  nasal congestion or discharge of excess mucus or purulent secretions, ear ache,   fever, chills, sweats, unintended wt loss or wt gain, classically pleuritic or exertional cp,  orthopnea pnd or arm/hand swelling  or leg swelling, presyncope, palpitations, abdominal pain, anorexia, nausea, vomiting, diarrhea  or change in bowel habits or change in bladder habits, change in stools or change in urine, dysuria, hematuria,  rash, arthralgias, visual complaints, headache, numbness, weakness or ataxia or problems with walking or coordination,  change in mood or  memory.  Current Meds  Medication Sig   ACCU-CHEK AVIVA PLUS test strip USE TO TEST BLOOD SUGAR FOUR TIMES DAILY   Accu-Chek Softclix Lancets lancets BOOD SUGAR FOUR TIMES DAILY Dx E11.9   albuterol (VENTOLIN HFA) 108 (90 Base) MCG/ACT inhaler Inhale 2 puffs into the lungs every 6 (six) hours as needed for wheezing or shortness of breath.   aspirin 81 MG chewable tablet Chew 81 mg by mouth daily.   blood glucose meter kit and supplies Dispense based on patient and insurance preference. Use up to four times daily as directed. (FOR ICD-10:  E11.22). Check BG daily   budesonide-formoterol  (SYMBICORT) 160-4.5 MCG/ACT inhaler Inhale 2 puffs into the lungs 2 (two) times daily.   busPIRone (BUSPAR) 10 MG tablet Take 1 tablet (10 mg total) by mouth 2 (two) times daily.   carvedilol (COREG) 25 MG tablet TAKE 1 TABLET BY MOUTH TWICE DAILY WITH A MEAL   clopidogrel (PLAVIX) 75 MG tablet TAKE 1 TABLET BY MOUTH EVERY DAY   colestipol (COLESTID) 1 g tablet Take 2 tablets (2 g total) by mouth daily. Do not take within 4 hours of other medications.   empagliflozin (JARDIANCE) 25 MG TABS tablet Take 1 tablet (25 mg total) by mouth daily before breakfast.   Evolocumab (REPATHA SURECLICK) 784 MG/ML SOAJ Inject 140 mg into the skin every 14 (fourteen) days.   fenofibrate (TRICOR) 48 MG tablet Take 1 tablet (48 mg total) by mouth daily.   FLUoxetine (PROZAC) 20 MG capsule TAKE THREE CAPSULES BY MOUTH DAILY   Insulin Pen Needle (GLOBAL EASE INJECT PEN NEEDLES) 31G X 5 MM MISC USE ONCE EVERY DAY   lidocaine (LIDODERM) 5 % Place 1 patch onto the skin daily. Remove & Discard patch within 12 hours or as directed by MD. NOT a candidate for NSAIDs due to MI/ CAD.   liraglutide (VICTOZA) 18 MG/3ML SOPN INJECT 1.8 MG into THE SKIN DAILY   losartan (COZAAR) 25 MG tablet TAKE 1 TABLET BY MOUTH DAILY   medroxyPROGESTERone (PROVERA) 5 MG tablet TAKE 1 TABLET BY MOUTH DAILY   metFORMIN (GLUCOPHAGE) 1000 MG tablet TAKE 1 TABLET BY MOUTH TWICE DAILY   nitroGLYCERIN (NITROSTAT) 0.4 MG SL tablet DISSOLVE 1 TABLET UNDER THE TONGUE EVERY 5 MINUTES AS NEEDED FOR CHEST PAIN. DO NOT EXCEED A TOTAL OF 3 DOSES IN 15 MINUTES. (Patient taking differently: Place 0.4 mg under the tongue every 5 (five) minutes as needed for chest pain.)   pantoprazole (PROTONIX) 40 MG tablet TAKE 1 TABLET BY MOUTH DAILY   potassium chloride SA (KLOR-CON) 20 MEQ tablet TAKE 1 TABLET BY MOUTH EVERY DAY (Patient taking differently: Take 20 mEq by mouth daily as needed (when taking Torsemide).)   pregabalin (LYRICA) 150 MG capsule Take 1 capsule (150  mg total) by mouth 2 (two) times daily.   ranolazine (RANEXA) 1000 MG SR tablet TAKE 1 TABLET BY MOUTH TWICE DAILY   rosuvastatin (CRESTOR) 40 MG tablet Take 1 tablet (40 mg total) by mouth daily.   torsemide (DEMADEX) 20 MG tablet TAKE TWO TABLETS BY MOUTH DAILY AS NEEDED FOR swelling   traMADol (ULTRAM) 50 MG tablet Take 1-2 tablets (50-100 mg total) by mouth every 12 (twelve) hours as needed for severe pain (ONLY IF NEEDED!!).   VASCEPA 1 g capsule TAKE TWO CAPSULES BY MOUTH TWICE DAILY   [DISCONTINUED] budesonide-formoterol (SYMBICORT) 160-4.5 MCG/ACT inhaler Inhale 2 puffs into the lungs in the morning and at bedtime.  Past Medical History:  Diagnosis Date   Allergy    Anemia    Anginal pain (HCC)    Anxiety    CHF (congestive heart failure) ([place])    Chronic kidney disease    history of kidney stones   Coronary artery disease 09/2011   a.s/p BMS to LAD and angioplasty of RI in 09/2011 b. patent stent by cath in 02/2012, 12/2013, and 02/2016 with most recent showing 30-40% dLAD and 30-40% RCA stenosis   Depression    Diabetes mellitus    Dyspnea    Hyperlipidemia    Hypertension    Midsternal chest pain 03/03/2012   Migraines    Morbid obesity ([place])    Myocardial infarct ([place]) 09/2011   Myocardial infarction ([place]) 02/2016   Neuromuscular disorder ([place])    Palpitations 02/17/2012   Ureteral stone with hydronephrosis    Urinary tract obstruction due to kidney stone 02/15/2017       Objective:    Wt Readings from Last 3 Encounters:  08/17/21 (!) 393 lb (178.3 kg)  06/18/21 (!) 392 lb 1.3 oz (177.8 kg)  06/08/21 (!) 389 lb (176.4 kg)      Vital signs reviewed  08/17/2021  - Note at rest 02 sats  96% on RA   General appearance:    obese slow moving amb wf nad   HEENT : pt wearing mask not removed for exam due to covid -19 concerns.    NECK :  without JVD/Nodes/TM/ nl carotid upstrokes bilaterally   LUNGS: no acc muscle use,  Nl contour chest which is  clear to A and P bilaterally without cough on insp or exp maneuvers   CV:  RRR  no s3 or murmur or increase in P2, and no edema   ABD:  obese soft and nontender with nl inspiratory excursion in the supine position. No bruits or organomegaly appreciated, bowel sounds nl  MS:  slt awkward slow  gait/ ext warm without deformities, calf tenderness, cyanosis or clubbing No obvious joint restrictions   SKIN: warm and dry without lesions    NEURO:  alert, approp, nl sensorium with  no motor or cerebellar deficits apparent.           Assessment

## 2021-08-17 NOTE — Patient Instructions (Addendum)
We will try a PA for your [name]  Stop carevidool and start bisoprolol 5 mg take one twice daily (take two every 12 hours if not satisfied)   In meantime: Plan A = Automatic = Always=    Symbicort 160 Take 2 puffs first thing in am and then another 2 puffs about 12 hours later.    Work on inhaler technique:  relax and gently blow all the way out then take a nice smooth full deep breath back in, triggering the inhaler at same time you start breathing in.  Hold for up to 5 seconds if you can. Blow out thru nose. Rinse and gargle with water when done.  If mouth or throat bother you at all,  try brushing teeth/gums/tongue with arm and hammer toothpaste/ make a slurry and gargle and spit out.       Plan B = Backup (to supplement plan A, not to replace it) Only use your albuterol inhaler as a rescue medication to be used if you can't catch your breath by resting or doing a relaxed purse lip breathing pattern.  - The less you use it, the better it will work when you need it. - Ok to use the inhaler up to 2 puffs  every 4 hours if you must but call for appointment if use goes up over your usual need - Don't leave home without it !!  (think of it like the spare tire for your car)     Ok to try albuterol 15 min before an activity (on alternating days)  that you know would usually make you short of breath and see if it makes any difference and if makes none then don't take albuterol after activity unless you can't catch your breath as this means it's the resting that helps, not the albuterol.   Please schedule a follow up visit in 6  months but call sooner if needed

## 2021-08-17 NOTE — Assessment & Plan Note (Signed)
PFT's  08/12/21 complete reversible airflow obst - d/c coreg 08/17/2021   Much more asthma than I suspected so rec change to more specific BB and f/u with Dr Wyline Mood as planned  Symbicort 160 used effectively should suffice and if not will need PA for [facility]  - The proper method of use, as well as anticipated side effects, of a metered-dose inhaler were discussed and demonstrated to the patient using teach back method.

## 2021-08-17 NOTE — Assessment & Plan Note (Addendum)
Active smoker - PFTs  11/08/17 nl x for ERV 44% at wt 386 - 06/18/2021  @ wt 302   Walked on RA x  1  lap(s) =  approx 146ft @ slow  pace, stopped due to sob/legs have out  with lowest 02 sats 97% - repeated with same symptoms, sats 96%  - 06/18/2021  After extensive coaching inhaler device,  effectiveness =    75% (short ti) trial of breztri x 1 week sample and f/u with full pfts  PFT's  08/12/21 FEV1 3.30 (92 % ) ratio 0.79  p 79 % improvement from saba p nothing prior to study with DLCO  16.55 (62%) corrects to 4.97 (116 %)  for alv/ /volume and FV curve atypical for obst prior to saba, nl p saba   - ERV 21% at wt 386  > see AB  - 08/17/2021 RA patinet walked at a slow pace c 2 laps @ 150 ft each. Reported SOB on lap one that increased on lap two. Stopped after second lap due to leg pain with lowest sats 95%  Marked reversibility p saba c/w asthma >> copd so needs trial on more selective BB and continue symbiocrt 160 or breztri 2bid and keep working on wt loss (see separate a/p)

## 2021-08-17 NOTE — Assessment & Plan Note (Signed)
Changed coreg to bisoprolol 08/17/2021   In the setting of marked airways reversibility,  It would be preferable to use bystolic, the most beta -1  selective Beta blocker available in sample form, with bisoprolol the most selective generic choice  on the market, at least on a trial basis, to make sure the spillover Beta 2 effects of the less specific Beta blockers are not contributing to this patient's symptoms.   >>> try bisoprolol 5 mg bid and increase to 10 mg bid if needed   Each maintenance medication was reviewed in detail including emphasizing most importantly the difference between maintenance and prns and under what circumstances the prns are to be triggered using an action plan format where appropriate.  Total time for H and P, chart review, counseling, reviewing hfa device(s) , directly observing portions of ambulatory 02 saturation study/ and generating customized AVS unique to this office visit / same day charting   > 40 min

## 2021-08-17 NOTE — Assessment & Plan Note (Signed)
Body mass index is 55.59 kg/m.  -  trending  No change  Lab Results  Component Value Date   TSH 0.608 04/08/2020      Contributing to doe and risk of GERD >>>   reviewed the need and the process to achieve and maintain neg calorie balance > defer f/u primary care including intermittently monitoring thyroid status

## 2021-08-18 ENCOUNTER — Telehealth: Payer: Self-pay | Admitting: Pharmacy Technician

## 2021-08-18 NOTE — Telephone Encounter (Signed)
Patient Advocate Encounter  Received notification from [employer] that prior authorization for BISOPROLOL 5MG  is required.   PA submitted on 11.22.22 Key [id number] Status is pending   [facility] will continue to follow  11.24.22, CPhT Patient Advocate Phone: [phone] Fax:  [phone]

## 2021-08-19 NOTE — Telephone Encounter (Signed)
Received notification from [employer] regarding a prior authorization for . Authorization BISOPROLOL 5MG has been APPROVED from 11.22.22 to 11.22.23.    Authorization # PA Case: [id number]

## 2021-08-26 ENCOUNTER — Ambulatory Visit (INDEPENDENT_AMBULATORY_CARE_PROVIDER_SITE_OTHER): Payer: Medicaid Other | Admitting: Cardiology

## 2021-08-26 ENCOUNTER — Encounter: Payer: Self-pay | Admitting: Cardiology

## 2021-08-26 VITALS — BP 130/80 | HR 98 | Resp 20 | Ht 70.0 in | Wt 389.4 lb

## 2021-08-26 DIAGNOSIS — I10 Essential (primary) hypertension: Secondary | ICD-10-CM | POA: Diagnosis not present

## 2021-08-26 DIAGNOSIS — E782 Mixed hyperlipidemia: Secondary | ICD-10-CM

## 2021-08-26 DIAGNOSIS — I25119 Atherosclerotic heart disease of native coronary artery with unspecified angina pectoris: Secondary | ICD-10-CM

## 2021-08-26 DIAGNOSIS — I5032 Chronic diastolic (congestive) heart failure: Secondary | ICD-10-CM

## 2021-08-26 NOTE — Progress Notes (Signed)
Clinical Summary Ms. Arlington is a 40 y.o.femaleseen today for follow up of the following medical problems.    1. CAD - prior anterior MI 09/2011, 95% lesion in mid LAD and ramus with 95% ostial stenosis. LVEF 55% by LV gram, apical hypokinesis. BMS to mid LAD and cutting balloon angioplasty of ramus.   - repeat cath 02/2012 with patent vessels and stent - 09/2011 Echo: LVEF 50% - 12/2013 Lexiscan large anteroseptal ischemia - cath 12/2013 with patent vessels  - cath 02/2016 [facility] in setting of NSTEMI showed LM patent, LAD patent with patent stent, distal LAD 30-40%, LCX patent, RCA 30-40%. LVEDP 40, PCWP 30 mean PA 32, CI 4.93 - echo 02/2016 [place] LVEF 50%, apical akinesis       - 07/2018 cath mild to moderate disease other than small D1 99% too small for PCI. Normal LVEDP - we tried imdur 77m daily. Reported some episodes of shaking on this medication. Changed to ranexa     12/2019 admitted with NSTEMI - cath with LM patent, ostial LAD 40%, D1 99%, ramus ostial 90%, LCX patent, RCA prox 50%. Received DES to ramus       -pulm changed coreg to bisoprolol - occasional chset pains, 1-2 times per week. Can occur at rest or with exrtion. Ongoing since COVID infection. Better with NG.  - walks up to 100 yards without chest pains.        2. Chronic diastolic HF -no recent edema - has torsemide, takes every 2-3 days as needed - has lost 14 lbs due to dietary changes    3. HTN -compliant with meds   4. Hyperlipidemia   12/2019 TC 107 TG 105 HDL 27 LDL 59 Jan 2022 TC 160 TG 157 HDL 35 LDL 94 - followed by lipid clinic, she is due for f/u     Past Medical History:  Diagnosis Date   Allergy    Anemia    Anginal pain (HCC)    Anxiety    CHF (congestive heart failure) (HCC)    Chronic asthmatic bronchitis ([place] 08/17/2021   Chronic kidney disease    history of kidney stones   Coronary artery disease 09/2011   a.s/p BMS to LAD and angioplasty of RI in 09/2011 b. patent  stent by cath in 02/2012, 12/2013, and 02/2016 with most recent showing 30-40% dLAD and 30-40% RCA stenosis   Depression    Diabetes mellitus    Dyspnea    Hyperlipidemia    Hypertension    Midsternal chest pain 03/03/2012   Migraines    Morbid obesity ([place]    [place]ocardial infar[place] 09/[place]   [place] 02/[place]   Neuromuscular disorder [place]    Palpitations 02/17/2012   Ureteral stone with hydronephrosis    Urinary tract obstruction due to kidney stone 02/15/2017     Allergies  Allergen Reactions   Bee Venom Anaphylaxis and Swelling   Latex Itching   Sulfa Drugs Cross Reactors Nausea And Vomiting     Current Outpatient Medications  Medication Sig Dispense Refill   ACCU-CHEK AVIVA PLUS test strip USE TO TEST BLOOD SUGAR FOUR TIMES DAILY 400 strip 3   Accu-Chek Softclix Lancets lancets BOOD SUGAR FOUR TIMES DAILY Dx E11.9 100 each 11   albuterol (VENTOLIN HFA) 108 (90 Base) MCG/ACT inhaler Inhale 2 puffs into the lungs every 4 (four) hours as needed for wheezing or shortness of breath. 8 g 5   aspirin 81 MG chewable tablet Chew 81  mg by mouth daily.     bisoprolol (ZEBETA) 5 MG tablet One twice daily 60 tablet 11   blood glucose meter kit and supplies Dispense based on patient and insurance preference. Use up to four times daily as directed. (FOR ICD-10:  E11.22). Check BG daily 1 each 0   budesonide-formoterol (SYMBICORT) 160-4.5 MCG/ACT inhaler Take 2 puffs first thing in am and then another 2 puffs about 12 hours later. 1 each 11   busPIRone (BUSPAR) 10 MG tablet Take 1 tablet (10 mg total) by mouth 2 (two) times daily. 60 tablet 12   clopidogrel (PLAVIX) 75 MG tablet TAKE 1 TABLET BY MOUTH EVERY DAY 94 tablet 3   colestipol (COLESTID) 1 g tablet Take 2 tablets (2 g total) by mouth daily. Do not take within 4 hours of other medications. 60 tablet 3   empagliflozin (JARDIANCE) 25 MG TABS tablet Take 1 tablet (25 mg total) by mouth daily before breakfast. 30  tablet 12   Evolocumab (REPATHA SURECLICK) 938 MG/ML SOAJ Inject 140 mg into the skin every 14 (fourteen) days. 2 mL 6   fenofibrate (TRICOR) 48 MG tablet Take 1 tablet (48 mg total) by mouth daily. 30 tablet 5   FLUoxetine (PROZAC) 20 MG capsule TAKE THREE CAPSULES BY MOUTH DAILY 90 capsule 0   Insulin Pen Needle (GLOBAL EASE INJECT PEN NEEDLES) 31G X 5 MM MISC USE ONCE EVERY DAY 100 each 3   lidocaine (LIDODERM) 5 % Place 1 patch onto the skin daily. Remove & Discard patch within 12 hours or as directed by MD. NOT a candidate for NSAIDs due to MI/ CAD. 30 patch prn   liraglutide (VICTOZA) 18 MG/3ML SOPN INJECT 1.8 MG into THE SKIN DAILY 9 mL 3   losartan (COZAAR) 25 MG tablet TAKE 1 TABLET BY MOUTH DAILY 90 tablet 1   medroxyPROGESTERone (PROVERA) 5 MG tablet TAKE 1 TABLET BY MOUTH DAILY 90 tablet 0   metFORMIN (GLUCOPHAGE) 1000 MG tablet TAKE 1 TABLET BY MOUTH TWICE DAILY 60 tablet 1   nitroGLYCERIN (NITROSTAT) 0.4 MG SL tablet DISSOLVE 1 TABLET UNDER THE TONGUE EVERY 5 MINUTES AS NEEDED FOR CHEST PAIN. DO NOT EXCEED A TOTAL OF 3 DOSES IN 15 MINUTES. (Patient taking differently: Place 0.4 mg under the tongue every 5 (five) minutes as needed for chest pain.) 25 tablet 3   pantoprazole (PROTONIX) 40 MG tablet TAKE 1 TABLET BY MOUTH DAILY 30 tablet 0   potassium chloride SA (KLOR-CON) 20 MEQ tablet TAKE 1 TABLET BY MOUTH EVERY DAY (Patient taking differently: Take 20 mEq by mouth daily as needed (when taking Torsemide).) 90 tablet 3   pregabalin (LYRICA) 150 MG capsule Take 1 capsule (150 mg total) by mouth 2 (two) times daily. 60 capsule 3   ranolazine (RANEXA) 1000 MG SR tablet TAKE 1 TABLET BY MOUTH TWICE DAILY 180 tablet 3   rosuvastatin (CRESTOR) 40 MG tablet Take 1 tablet (40 mg total) by mouth daily. 30 tablet 6   torsemide (DEMADEX) 20 MG tablet TAKE TWO TABLETS BY MOUTH DAILY AS NEEDED FOR swelling 180 tablet 3   traMADol (ULTRAM) 50 MG tablet Take 1-2 tablets (50-100 mg total) by mouth  every 12 (twelve) hours as needed for severe pain (ONLY IF NEEDED!!). 60 tablet 3   VASCEPA 1 g capsule TAKE TWO CAPSULES BY MOUTH TWICE DAILY 120 capsule 11   No current facility-administered medications for this visit.     Past Surgical History:  Procedure Laterality Date  BIOPSY  10/27/2020   Procedure: BIOPSY;  Surgeon: Daneil Dolin, MD;  [facility];  Service: Endoscopy;;   CARDIAC CATHETERIZATION  10/08/2011   LAD: 95% mid, Ramus: 95% ostial   CARPAL TUNNEL RELEASE     CESAREAN SECTION     CHOLECYSTECTOMY     CORONARY ANGIOPLASTY WITH STENT PLACEMENT  10/08/2011   LAD: BMS, Ramus: cutting balloon angioplasty   CORONARY STENT INTERVENTION N/A 01/14/2020   Procedure: CORONARY STENT INTERVENTION;  Surgeon: Leonie Man, MD;  Location: [facility];  Service: Cardiovascular;  Laterality: N/A;   CYSTOSCOPY WITH RETROGRADE PYELOGRAM, URETEROSCOPY AND STENT PLACEMENT Left 02/16/2017   Procedure: CYSTOSCOPY WITH RETROGRADE PYELOGRAM, URETEROSCOPY AND STENT PLACEMENT;  Surgeon: Kathie Rhodes, MD;  Location: [facility];  Service: Urology;  Laterality: Left;   CYSTOSCOPY WITH RETROGRADE PYELOGRAM, URETEROSCOPY AND STENT PLACEMENT Left 03/28/2017   Procedure: LEFT STENT REMOVAL LEFT RETROGRADE PYELOGRAM LEFT URETEROSCOPY   LASER LITHOTRIPSY  AND  STENT REPLACEMENT;  Surgeon: Cleon Gustin, MD;  Location: [facility];  Service: Urology;  Laterality: Left;   ESOPHAGOGASTRODUODENOSCOPY (EGD) WITH PROPOFOL N/A 10/27/2020   Rourk: mild erosive reflux esophagitis, h.pylori gastritis   LEFT HEART CATH AND CORONARY ANGIOGRAPHY N/A 08/15/2018   Procedure: LEFT HEART CATH AND CORONARY ANGIOGRAPHY;  Surgeon: Martinique, Peter M, MD;  Location: [place] [facility];  Service: Cardiovascular;  Laterality: N/A;   LEFT HEART CATH AND CORONARY ANGIOGRAPHY N/A 01/14/2020   Procedure: LEFT HEART CATH AND CORONARY ANGIOGRAPHY;  Surgeon: Leonie Man, MD;  Location: [place] [facility];   Service: Cardiovascular;  Laterality: N/A;   LEFT HEART CATHETERIZATION WITH CORONARY ANGIOGRAM N/A 10/08/2011   Procedure: LEFT HEART CATHETERIZATION WITH CORONARY ANGIOGRAM;  Surgeon: Peter M Martinique, MD;  Location[facility] CATH LAB;  Service: Cardiovascular;  Laterality: N/A;   LEFT HEART CATHETERIZATION WITH CORONARY ANGIOGRAM N/A 03/02/2012   Procedure: LEFT HEART CATHETERIZATION WITH CORONARY ANGIOGRAM;  Surgeon: Burnell Blanks, MD;  Location: [facility] CATH LAB;  Service: Cardiovascular;  Laterality: N/A;   LEFT HEART CATHETERIZATION WITH CORONARY ANGIOGRAM N/A 01/04/2014   Procedure: LEFT HEART CATHETERIZATION WITH CORONARY ANGIOGRAM;  Surgeon: Troy Sine, MD;  Location: [facility] CATH LAB;  Service: Cardiovascular;  Laterality: N/A;   right hand pinky surgery       Allergies  Allergen Reactions   Bee Venom Anaphylaxis and Swelling   Latex Itching   Sulfa Drugs Cross Reactors Nausea And Vomiting      Family History  Problem Relation Age of Onset   Heart murmur Father    Diabetes Father    Hypertension Father    Hyperlipidemia Father    Cancer Father        throat   Diabetes Mother    Cancer Mother        breast.uterine, ovarian   Asthma Son    Appendicitis Son    Hernia Son    Mental illness Son        Bipolar, personality d/o   Stroke Maternal Grandmother    Cancer Maternal Grandmother        breast   Cancer Paternal Grandfather        lung   Colon cancer Neg Hx      Social History Ms. Justiniano reports that she has quit smoking. Her smoking use included cigarettes. She started smoking about 31 years ago. She has a 10.00 pack-year smoking history. She has never used smokeless tobacco. Ms. Shakir reports that she  does not currently use alcohol.   Review of Systems CONSTITUTIONAL: No weight loss, fever, chills, weakness or fatigue.  HEENT: Eyes: No visual loss, blurred vision, double vision or yellow sclerae.No hearing loss, sneezing, congestion, runny nose or sore  throat.  SKIN: No rash or itching.  CARDIOVASCULAR: per hpi RESPIRATORY: No shortness of breath, cough or sputum.  GASTROINTESTINAL: No anorexia, nausea, vomiting or diarrhea. No abdominal pain or blood.  GENITOURINARY: No burning on urination, no polyuria NEUROLOGICAL: No headache, dizziness, syncope, paralysis, ataxia, numbness or tingling in the extremities. No change in bowel or bladder control.  MUSCULOSKELETAL: No muscle, back pain, joint pain or stiffness.  LYMPHATICS: No enlarged nodes. No history of splenectomy.  PSYCHIATRIC: No history of depression or anxiety.  ENDOCRINOLOGIC: No reports of sweating, cold or heat intolerance. No polyuria or polydipsia.  Marland Kitchen   Physical Examination Today's Vitals   08/26/21 1332  BP: 130/80  Pulse: 98  Resp: 20  SpO2: 98%  Weight: (!) 389 lb 6.4 oz (176.6 kg)  Height: 5' 10"  (1.778 m)  PainSc: 0-No pain   Body mass index is 55.87 kg/m.  Gen: resting comfortably, no acute distress HEENT: no scleral icterus, pupils equal round and reactive, no palptable cervical adenopathy,  CV: RRR, no m/rg/ no jvd Resp: Clear to auscultation bilaterally GI: abdomen is soft, non-tender, non-distended, normal bowel sounds, no hepatosplenomegaly MSK: extremities are warm, no edema.  Skin: warm, no rash Neuro:  no focal deficits Psych: appropriate affect   Diagnostic Studies 02/2012 Cath Hemodynamic Findings:   Central aortic pressure: 108/70   Left ventricular pressure: 123/10/21   Angiographic Findings:   Left main: No obstructive disease noted.   Left Anterior Descending Artery: Large caliber vessel that courses to the apex. There is a stent present in the mid vessel that is widely patent with no restenosis. The remainder of the LAD is disease free. There is a moderate sized diagonal Luvern Mischke with 30% proximal stenosis.   Circumflex Artery: Dominant, large caliber vessel with no disease throughout the AV groove segment. There is small to moderate  sized intermediate Avie Checo that has ostial 30% stenosis. The remainder of the Circumflex has no obstructive disease.   Right Coronary Artery: Small, non-dominant vessel. No disease noted.   Left Ventricular Angiogram: LVEF 50-55%.   Impression:   1. Double vessel CAD with patent stent mid LAD and patent angioplasty site at ostium of intermediate Alvira Hecht.   2. Normal LV systolic function     09/7913 Cath PROCEDURAL FINDINGS   Hemodynamics:   AO 135/92 with a mean of 114 mmHg   LV 136/25 mmHg   Coronary angiography:   Coronary dominance: Left   Left mainstem: Normal.   Left anterior descending (LAD): There is a 95% stenosis in the mid LAD immediately after the takeoff of the first diagonal. The first diagonal Honey Zakarian is moderate in size and appears normal. The LAD stenosis does appear to be hazy.   There is a moderately large ramus intermediate Khylan Sawyer which has a 95% ostial stenosis.   Left circumflex (LCx): This is a dominant vessel and appears normal throughout.   Right coronary artery (RCA): This is a small nondominant vessel and is normal.   Left ventriculography: Left ventricular systolic function is abnormal with apical hypokinesis. LVEF is estimated at 55%, there is no significant mitral regurgitation   PCI Note: Following the diagnostic procedure, the decision was made to proceed with PCI of the LAD. Weight-based bivalirudin was given for anticoagulation. Once  a therapeutic ACT was achieved, a 5 [place] EBU guide catheter was inserted. A pro-water coronary guidewire was used to cross the lesion. The lesion was predilated with a 2.5 mm balloon. The lesion was then stented with a 3.0 x 18 mm vision stent. The stent was postdilated with a 3.25 mm noncompliant balloon. Following PCI, there was 0% residual stenosis and TIMI-3 flow. At this point the patient's pain was improved but was Rishel of moderate intensity. We then proceeded to intervene on the ostial ramus intermediate lesion. This was crossed  with the prowater wire. This was dilated using a 3.0 x 10 mm cutting balloon performing 3 inflations to 6 atmospheres. This showed a good angiographic result with less than 30% residual stenosis and TIMI grade 3 flow. Final angiography confirmed an excellent result. The patient tolerated the procedure well. There were no immediate procedural complications. A TR band was used for radial hemostasis. The patient was transferred to the post catheterization recovery area for further monitoring.   PCI Data:   Vessel - LAD/Segment - mid vessel immediately after the takeoff of the first diagonal.   Percent Stenosis (pre) 95%   TIMI-flow 3.   Stent 3.0 x 18 mm vision stent.   Percent Stenosis (post) 0%   TIMI-flow (post) 3.   Second vessel: Ostial ramus intermediate Prisha Hiley   Percent stenosis: 95%.   TIMI flow 3.   3.0 mm cutting balloon   Percent stenosis (post) less than 30%   TIMI flow 3   Final Conclusions:   1. Severe 2 vessel obstructive coronary disease.   2. Overall well preserved LV systolic function with apical wall motion abnormality and ejection fraction of 55%.   3. Successful intracoronary stenting of the mid LAD with a bare-metal stent.   4. Successful cutting balloon angioplasty of the ramus intermediate Danzell Birky.   Recommendations:   Continue therapy with Plavix. Patient reports a history of allergy to aspirin. Aggressive risk factor modification.   Disposition: Patient be observed in the intensive care unit tonight. If she has no complications she should be able to be transferred to telemetry in the morning.     12/2013 Cath HEMODYNAMICS:    Central Aorta: 100/60    Left Ventricle: 100/3   ANGIOGRAPHY:   1. Left main: Normal and trifurcated into an LAD, ramus intermediate vessel, and a dominant left circumflex coronary artery   2. LAD: Leandrew Koyanagi gave rise to a proximal large first diagonal vessel.  The LAD just after the diagonal vessel had a widely patent stent.  The remainder of  the LAD was free of significant disease and extended to the LV apex. 3. Ramus Intermediate: No evidence for restenosis at previous site of ostial cutting balloon intervention. 4. Left circumflex: Angiographically normal.  Dominant vessel, which gave rise to 2 marginal branches and in the PDA.Marland Kitchen   4. Right coronary artery: Angiographically normal, nondominant vessel   Left ventriculography revealed global LV function.  There was a suggestion of mild residual distal anterolateral hypocontractility.  There was catheter-induced mitral regurgitation.   IMPRESSION:   Normal LV function with mild distal anterolateral, residual hypocontractility   No significant residual coronary obstructive disease with evidence for widely patent LAD stent, no evidence for restenosis of the ramus intermediate vessel, normal dominant left circumflex coronary artery and normal nondominant right coronary artery.       12/2013 MPI IMPRESSION: 1.  Abnormal Lexiscan for ischemia   2.  Large anteroseptal and apical area of ischemia  3. High risk study for major cardiac events based on area of myocardium at [facility]   4. Normal left ventricular systolic function, left ventricular ejection fraction 52%     12/2019 cath A drug-eluting stent was successfully placed using a STENT RESOLUTE ONYX 2.5X30. Postdilated to 2.7 mm Post intervention, there is a 0% residual stenosis throughout the entire segment.Marland Kitchen ------------ Colon Flattery LAD lesion is 40% stenosed. Prox LAD previously placed BMS stent is 30% stenosed. Prox LAD to Mid LAD lesion is 20% stenosed. Ost 1st Diag lesion is 99% stenosed. Known from prior catheterizations Small, nondominant RCA: Prox RCA to Dist RCA lesion is 50% stenosed. -------------- The left ventricular systolic function is normal. The left ventricular ejection fraction is 55-65% by visual estimate. LV end diastolic pressure is normal.   CULPRIT LESION: Ostial and proximal RAMUS INTERMEDIUS (RI) 95-70%   Successful DES PCI of RI (RESOLUTE ONYX DES 2.5 mm x 30 mm--2.7 mm) Otherwise moderate diffuse disease: Diffuse moderate LAD 20-30% stenosis and known 99% subtotal occlusion of the very small caliber 1st Diag Preserved LVEF and normal EDP.      Assessment and Plan   1. CAD - due to disease burden have elected for extended plavix use - mild chest pains at times perhaps due to residual small vessel diag disease - follow with change to bisoprolol, could add norvasc as well. Side effects on imdur in the past, she is already on high dose ranexa     2. Chronic diastolic HF - euvolemic, continue prn torsemide   3. HTN -bp at goal, continue current meds   4. Hyperlipidemia - due for f/u with lipid clinic, she is going to contac them - continue current regimen.      Arnoldo Lenis, M.D.

## 2021-08-26 NOTE — Patient Instructions (Addendum)

## 2021-09-01 ENCOUNTER — Ambulatory Visit (INDEPENDENT_AMBULATORY_CARE_PROVIDER_SITE_OTHER): Payer: Medicaid Other | Admitting: Family Medicine

## 2021-09-01 ENCOUNTER — Encounter: Payer: Self-pay | Admitting: Family Medicine

## 2021-09-01 VITALS — BP 137/87 | HR 90 | Temp 98.1°F | Ht 70.0 in | Wt 388.4 lb

## 2021-09-01 DIAGNOSIS — N939 Abnormal uterine and vaginal bleeding, unspecified: Secondary | ICD-10-CM

## 2021-09-01 DIAGNOSIS — Z1231 Encounter for screening mammogram for malignant neoplasm of breast: Secondary | ICD-10-CM

## 2021-09-01 DIAGNOSIS — L729 Follicular cyst of the skin and subcutaneous tissue, unspecified: Secondary | ICD-10-CM | POA: Diagnosis not present

## 2021-09-01 DIAGNOSIS — L600 Ingrowing nail: Secondary | ICD-10-CM | POA: Diagnosis not present

## 2021-09-01 DIAGNOSIS — E1159 Type 2 diabetes mellitus with other circulatory complications: Secondary | ICD-10-CM | POA: Diagnosis not present

## 2021-09-01 DIAGNOSIS — E1165 Type 2 diabetes mellitus with hyperglycemia: Secondary | ICD-10-CM

## 2021-09-01 DIAGNOSIS — E785 Hyperlipidemia, unspecified: Secondary | ICD-10-CM

## 2021-09-01 DIAGNOSIS — I152 Hypertension secondary to endocrine disorders: Secondary | ICD-10-CM | POA: Diagnosis not present

## 2021-09-01 DIAGNOSIS — E1169 Type 2 diabetes mellitus with other specified complication: Secondary | ICD-10-CM | POA: Diagnosis not present

## 2021-09-01 LAB — BAYER DCA HB A1C WAIVED: HB A1C (BAYER DCA - WAIVED): 8.7 % — ABNORMAL HIGH (ref 4.8–5.6)

## 2021-09-01 MED ORDER — OZEMPIC (0.25 OR 0.5 MG/DOSE) 2 MG/1.5ML ~~LOC~~ SOPN: 0.5000 mg | PEN_INJECTOR | SUBCUTANEOUS | 1 refills | Status: DC

## 2021-09-01 NOTE — Progress Notes (Signed)
Subjective: CC:DM PCP: Janora Norlander, DO QAS:[id number] D Burdette is a 40 y.o. female presenting to clinic today for:  1. Type 2 Diabetes with hypertension, hyperlipidemia:  Reports compliance with medication but admits that she has been under more stress and has not been eating or drinking appropriately.  She tries to drink water primarily but admits that she does drink juice.  No sodas.  She feels like she eats healthily.  High at home: 300s; Low at home: 113, Taking medication(s): Victoza, metformin, losartan, Crestor, Lyrica and Vascepa.  Last eye exam: UTD Last foot exam: needs Last A1c:  Lab Results  Component Value Date   HGBA1C 7.9 (H) 06/08/2021   Nephropathy screen indicated?: UTD Last flu, zoster and/or pneumovax:  Immunization History  Administered Date(s) Administered   Influenza Split 10/10/2011   Influenza,inj,Quad PF,6+ Mos 10/28/2017, 06/26/2018, 06/05/2019, 08/19/2020, 06/18/2021   Influenza-Unspecified 06/26/2018, 06/05/2019   PFIZER(Purple Top)SARS-COV-2 Vaccination 03/27/2020   Pneumococcal Conjugate-13 10/28/2017   Pneumococcal Polysaccharide-23 10/10/2011   Tdap 01/25/2018    ROS: Reports polydipsia, polyuria and some blurred vision with elevated blood sugars.  Has chronic neuropathy.  No chest pain or shortness of breath.  2.  Ingrown toenail Patient reports that she has had an ingrown toe on the left medial big toe for about a week now.  Denies any purulence.  She has been soaking in Epsom salt and Listerine in efforts to keep the area sterile.  3.  Lump Patient reports a lump on the left buttock that has been gradually getting larger.  She notes it is worse after sitting.  She has tried hot compresses and other remedies in efforts to alleviate but lesion has not resolved.  Denies any drainage.  4.  Abnormal uterine bleeding Patient with return of her menstrual cycle this past week.  She been without a menses for over a year.  She is previously  seen Dr. Elonda Husky in [place] for abnormal uterine bleeding but she does not want to return to him.    ROS: Per HPI  Allergies  Allergen Reactions   Bee Venom Anaphylaxis and Swelling   Latex Itching   Sulfa Drugs Cross Reactors Nausea And Vomiting   Past Medical History:  Diagnosis Date   Allergy    Anemia    Anginal pain (HCC)    Anxiety    CHF (congestive heart failure) (HCC)    Chronic asthmatic bronchitis ([place]) 08/17/2021   Chronic kidney disease    history of kidney stones   Coronary artery disease 09/2011   a.s/p BMS to LAD and angioplasty of RI in 09/2011 b. patent stent by cath in 02/2012, 12/2013, and 02/2016 with most recent showing 30-40% dLAD and 30-40% RCA stenosis   Depression    Diabetes mellitus    Dyspnea    Hyperlipidemia    Hypertension    Midsternal chest pain 03/03/2012   Migraines    Morbid obesity ([place])    Myocardial infarct ([place]) 09/2011   Myocardial infarction ([place]) 02/2016   Neuromuscular disorder ([place])    Palpitations 02/17/2012   Ureteral stone with hydronephrosis    Urinary tract obstruction due to kidney stone 02/15/2017    Current Outpatient Medications:    ACCU-CHEK AVIVA PLUS test strip, USE TO TEST BLOOD SUGAR FOUR TIMES DAILY, Disp: 400 strip, Rfl: 3   Accu-Chek Softclix Lancets lancets, BOOD SUGAR FOUR TIMES DAILY Dx E11.9, Disp: 100 each, Rfl: 11   albuterol (VENTOLIN HFA) 108 (90 Base) MCG/ACT inhaler, Inhale 2  puffs into the lungs every 4 (four) hours as needed for wheezing or shortness of breath., Disp: 8 g, Rfl: 5   aspirin 81 MG chewable tablet, Chew 81 mg by mouth daily., Disp: , Rfl:    bisoprolol (ZEBETA) 5 MG tablet, One twice daily, Disp: 60 tablet, Rfl: 11   blood glucose meter kit and supplies, Dispense based on patient and insurance preference. Use up to four times daily as directed. (FOR ICD-10:  E11.22). Check BG daily, Disp: 1 each, Rfl: 0   budesonide-formoterol (SYMBICORT) 160-4.5 MCG/ACT inhaler, Take 2 puffs first  thing in am and then another 2 puffs about 12 hours later., Disp: 1 each, Rfl: 11   busPIRone (BUSPAR) 10 MG tablet, Take 1 tablet (10 mg total) by mouth 2 (two) times daily., Disp: 60 tablet, Rfl: 12   clopidogrel (PLAVIX) 75 MG tablet, TAKE 1 TABLET BY MOUTH EVERY DAY, Disp: 94 tablet, Rfl: 3   Evolocumab (REPATHA SURECLICK) 161 MG/ML SOAJ, Inject 140 mg into the skin every 14 (fourteen) days., Disp: 2 mL, Rfl: 6   fenofibrate (TRICOR) 48 MG tablet, Take 1 tablet (48 mg total) by mouth daily., Disp: 30 tablet, Rfl: 5   FLUoxetine (PROZAC) 20 MG capsule, TAKE THREE CAPSULES BY MOUTH DAILY, Disp: 90 capsule, Rfl: 0   Insulin Pen Needle (GLOBAL EASE INJECT PEN NEEDLES) 31G X 5 MM MISC, USE ONCE EVERY DAY, Disp: 100 each, Rfl: 3   lidocaine (LIDODERM) 5 %, Place 1 patch onto the skin daily. Remove & Discard patch within 12 hours or as directed by MD. NOT a candidate for NSAIDs due to MI/ CAD., Disp: 30 patch, Rfl: prn   liraglutide (VICTOZA) 18 MG/3ML SOPN, INJECT 1.8 MG into THE SKIN DAILY, Disp: 9 mL, Rfl: 3   losartan (COZAAR) 25 MG tablet, TAKE 1 TABLET BY MOUTH DAILY, Disp: 90 tablet, Rfl: 1   medroxyPROGESTERone (PROVERA) 5 MG tablet, TAKE 1 TABLET BY MOUTH DAILY, Disp: 90 tablet, Rfl: 0   metFORMIN (GLUCOPHAGE) 1000 MG tablet, TAKE 1 TABLET BY MOUTH TWICE DAILY, Disp: 60 tablet, Rfl: 1   nitroGLYCERIN (NITROSTAT) 0.4 MG SL tablet, DISSOLVE 1 TABLET UNDER THE TONGUE EVERY 5 MINUTES AS NEEDED FOR CHEST PAIN. DO NOT EXCEED A TOTAL OF 3 DOSES IN 15 MINUTES. (Patient taking differently: Place 0.4 mg under the tongue every 5 (five) minutes as needed for chest pain.), Disp: 25 tablet, Rfl: 3   pantoprazole (PROTONIX) 40 MG tablet, TAKE 1 TABLET BY MOUTH DAILY, Disp: 30 tablet, Rfl: 0   potassium chloride SA (KLOR-CON) 20 MEQ tablet, TAKE 1 TABLET BY MOUTH EVERY DAY (Patient taking differently: Take 20 mEq by mouth daily as needed (when taking Torsemide).), Disp: 90 tablet, Rfl: 3   pregabalin (LYRICA)  150 MG capsule, Take 1 capsule (150 mg total) by mouth 2 (two) times daily., Disp: 60 capsule, Rfl: 3   ranolazine (RANEXA) 1000 MG SR tablet, TAKE 1 TABLET BY MOUTH TWICE DAILY, Disp: 180 tablet, Rfl: 3   rosuvastatin (CRESTOR) 40 MG tablet, Take 1 tablet (40 mg total) by mouth daily., Disp: 30 tablet, Rfl: 6   torsemide (DEMADEX) 20 MG tablet, TAKE TWO TABLETS BY MOUTH DAILY AS NEEDED FOR swelling, Disp: 180 tablet, Rfl: 3   traMADol (ULTRAM) 50 MG tablet, Take 1-2 tablets (50-100 mg total) by mouth every 12 (twelve) hours as needed for severe pain (ONLY IF NEEDED!!)., Disp: 60 tablet, Rfl: 3   VASCEPA 1 g capsule, TAKE TWO CAPSULES BY MOUTH TWICE  DAILY, Disp: 120 capsule, Rfl: 11 Social History   Socioeconomic History   Marital status: Married    Spouse name: Barnabas Lister   Number of children: 2   Years of education: 10   Highest education level: Not on file  Occupational History   Not on file  Tobacco Use   Smoking status: Former    Packs/day: 0.50    Years: 20.00    Pack years: 10.00    Types: Cigarettes    Start date: 03/18/1990   Smokeless tobacco: Never   Tobacco comments:    Vapes currently   Vaping Use   Vaping Use: Former  Substance and Sexual Activity   Alcohol use: Not Currently    Alcohol/week: 0.0 standard drinks    Comment: maybe once a year   Drug use: No   Sexual activity: Yes    Birth control/protection: None    Comment: PCOS  Other Topics Concern   Not on file  Social History Narrative   Unemployed   Applied for disability   Leisure "take care of house and kids"   Walks when able   Social Determinants of Health   Financial Resource Strain: Not on file  Food Insecurity: Not on file  Transportation Needs: Not on file  Physical Activity: Not on file  Stress: Not on file  Social Connections: Not on file  Intimate Partner Violence: Not on file   Family History  Problem Relation Age of Onset   Heart murmur Father    Diabetes Father    Hypertension Father     Hyperlipidemia Father    Cancer Father        throat   Diabetes Mother    Cancer Mother        breast.uterine, ovarian   Asthma Son    Appendicitis Son    Hernia Son    Mental illness Son        Bipolar, personality d/o   Stroke Maternal Grandmother    Cancer Maternal Grandmother        breast   Cancer Paternal Grandfather        lung   Colon cancer Neg Hx     Objective: Office vital signs reviewed. BP 137/87   Pulse 90   Temp 98.1 F (36.7 C)   Ht 5' 10" (1.778 m)   Wt (!) 388 lb 6.4 oz (176.2 kg)   SpO2 98%   BMI 55.73 kg/m   Physical Examination:  General: Awake, alert, morbidly obese, smells of tobacco, No acute distress HEENT: Normal; sclera white Cardio: regular rate and rhythm, S1S2 heard, no murmurs appreciated Pulm: clear to auscultation bilaterally, no wheezes, rhonchi or rales; normal work of breathing on room air Extremities: warm, well perfused, No edema, cyanosis or clubbing; +2 pulses bilaterally MSK: normal gait and station; left medial great toe with some soft tissue swelling but no purulence or maceration. Skin: Golf ball size soft tissue mass that apparently is to be well-circumscribed and mobile noted on the left gluteal cleft laterally.  This is tender to palpation.  No erythema or warmth    Assessment/ Plan: 40 y.o. female   Uncontrolled type 2 diabetes mellitus with hyperglycemia ([place]) - Plan: Bayer DCA Hb A1c Waived, Semaglutide,0.25 or 0.5MG/DOS, (OZEMPIC, 0.25 OR 0.5 MG/DOSE,) 2 MG/1.5ML SOPN  Hypertension associated with diabetes ([place]) - Plan: CMP14+EGFR  Hyperlipidemia associated with type 2 diabetes mellitus ([place]) - Plan: CMP14+EGFR, Lipid Panel  Abnormal uterine bleeding - Plan: Ambulatory referral to Obstetrics /  Gynecology  Ingrown toenail of left foot - Plan: Ambulatory referral to Podiatry  Cyst of buttocks - Plan: Ambulatory referral to General Surgery  Screening mammogram for breast cancer - Plan: MM 3D SCREEN BREAST  BILATERAL  Sugar remains uncontrolled.  I am going to have her switch to a once weekly regimen of Ozempic 0.5 mg since.  She is to discontinue the Victoza and start this new regimen with plans for recheck in 3 months.  I anticipate will need to advance her to the 1 mg at next visit  Blood pressure is at goal.  No changes needed  Check lipid panel, CMP.  Continue regimen as outlined by lipid clinic.  For her abnormal uterine bleeding new referral to OB/GYN placed.  Anticipate irregular menstrual cycles are related to morbid obesity  Ingrown toenail without evidence of a secondary infection.  Referral to podiatry placed  Lesion on left buttock appears to be an inclusion versus sebaceous cyst.  Referral to general surgery for excision placed.  No evidence of infection at this time  Referral for screening mammogram placed for [place]  Orders Placed This Encounter  Procedures   Bayer DCA Hb A1c Waived   CMP14+EGFR   Lipid Panel   No orders of the defined types were placed in this encounter.    Janora Norlander, DO [place] [phone]

## 2021-09-02 LAB — CMP14+EGFR
ALT: 38 IU/L — ABNORMAL HIGH (ref 0–32)
AST: 32 IU/L (ref 0–40)
Albumin/Globulin Ratio: 1.4 (ref 1.2–2.2)
Albumin: 4 g/dL (ref 3.8–4.8)
Alkaline Phosphatase: 91 IU/L (ref 44–121)
BUN/Creatinine Ratio: 11 (ref 9–23)
BUN: 8 mg/dL (ref 6–24)
Bilirubin Total: 0.3 mg/dL (ref 0.0–1.2)
CO2: 21 mmol/L (ref 20–29)
Calcium: 9.2 mg/dL (ref 8.7–10.2)
Chloride: 101 mmol/L (ref 96–106)
Creatinine, Ser: 0.7 mg/dL (ref 0.57–1.00)
Globulin, Total: 2.9 g/dL (ref 1.5–4.5)
Glucose: 259 mg/dL — ABNORMAL HIGH (ref 70–99)
Potassium: 4.1 mmol/L (ref 3.5–5.2)
Sodium: 136 mmol/L (ref 134–144)
Total Protein: 6.9 g/dL (ref 6.0–8.5)
eGFR: 111 mL/min/{1.73_m2} (ref >59)

## 2021-09-02 LAB — LIPID PANEL
Chol/HDL Ratio: 5.3 ratio — ABNORMAL HIGH (ref 0.0–4.4)
Cholesterol, Total: 185 mg/dL (ref 100–199)
HDL: 35 mg/dL — ABNORMAL LOW (ref >39)
LDL Chol Calc (NIH): 112 mg/dL — ABNORMAL HIGH (ref 0–99)
Triglycerides: 218 mg/dL — ABNORMAL HIGH (ref 0–149)
VLDL Cholesterol Cal: 38 mg/dL (ref 5–40)

## 2021-09-07 ENCOUNTER — Ambulatory Visit: Payer: Medicaid Other | Admitting: Podiatry

## 2021-09-07 ENCOUNTER — Other Ambulatory Visit: Payer: Self-pay

## 2021-09-07 DIAGNOSIS — L600 Ingrowing nail: Secondary | ICD-10-CM | POA: Diagnosis not present

## 2021-09-07 DIAGNOSIS — R200 Anesthesia of skin: Secondary | ICD-10-CM | POA: Insufficient documentation

## 2021-09-07 MED ORDER — GENTAMICIN SULFATE 0.1 % EX CREA: 1.0000 "application " | TOPICAL_CREAM | Freq: Two times a day (BID) | CUTANEOUS | 1 refills | Status: DC

## 2021-09-07 NOTE — Progress Notes (Signed)
Subjective: Patient presents today as a new patient for evaluation of pain to the lateral border left great toe.  Patient is on chronic use of anticoagulants secondary to history of MI.  Patient states that she has not had her anticoagulant Plavix for the past 4 days because she assumed there was going to be an ingrown toenail procedure performed today.  She presents for further treatment and evaluation. Patient is concerned for possible ingrown nail.  It is very sensitive to touch.  Patient presents today for further treatment and evaluation.  Past Medical History:  Diagnosis Date   Allergy    Anemia    Anginal pain (HCC)    Anxiety    CHF (congestive heart failure) (HCC)    Chronic asthmatic bronchitis (HCC) 08/17/2021   Chronic kidney disease    history of kidney stones   Coronary artery disease 09/2011   a.s/p BMS to LAD and angioplasty of RI in 09/2011 b. patent stent by cath in 02/2012, 12/2013, and 02/2016 with most recent showing 30-40% dLAD and 30-40% RCA stenosis   Depression    Diabetes mellitus    Dyspnea    Hyperlipidemia    Hypertension    Midsternal chest pain 03/03/2012   Migraines    Morbid obesity (HCC)    Myocardial infarct (HCC) 09/2011   Myocardial infarction (HCC) 02/2016   Neuromuscular disorder (HCC)    Palpitations 02/17/2012   Ureteral stone with hydronephrosis    Urinary tract obstruction due to kidney stone 02/15/2017    Objective:  General: Well developed, nourished, in no acute distress, alert and oriented x3   Dermatology: Skin is warm, dry and supple bilateral.  Lateral border left great toe appears to be erythematous with evidence of an ingrowing nail. Pain on palpation noted to the border of the nail fold. The remaining nails appear unremarkable at this time. There are no open sores, lesions.  Vascular: Dorsalis Pedis artery and Posterior Tibial artery pedal pulses palpable. No lower extremity edema noted.   Neruologic: Grossly intact via light  touch bilateral.  Musculoskeletal: Muscular strength within normal limits in all groups bilateral. Normal range of motion noted to all pedal and ankle joints.   Assesement: #1 Paronychia with ingrowing nail lateral border left great toe   Plan of Care:  1. Patient evaluated.  2. Discussed treatment alternatives and plan of care. Explained nail avulsion procedure and post procedure course to patient. 3. Patient opted for permanent partial nail avulsion of the ingrown portion of the nail.  4. Prior to procedure, local anesthesia infiltration utilized using 3 ml of a 50:50 mixture of 2% plain lidocaine and 0.5% plain marcaine in a normal hallux block fashion and a betadine prep performed.  5. Partial permanent nail avulsion with chemical matrixectomy performed using 3x30sec applications of phenol followed by alcohol flush.  6. Light dressing applied.  Post care instructions provided 7.  Prescription for gentamicin 2% cream  8.  Return to clinic 2 weeks.  Felecia Shelling, DPM [facility]  Dr. Felecia Shelling, DPM    [address] [place], [place] [address]                Office [phone]  Fax [phone]

## 2021-09-15 ENCOUNTER — Encounter: Payer: Self-pay | Admitting: General Surgery

## 2021-09-15 ENCOUNTER — Ambulatory Visit: Payer: Medicaid Other | Admitting: General Surgery

## 2021-09-15 ENCOUNTER — Other Ambulatory Visit: Payer: Self-pay

## 2021-09-15 VITALS — BP 129/83 | HR 87 | Temp 97.6°F | Resp 18 | Ht 70.0 in | Wt 384.0 lb

## 2021-09-15 DIAGNOSIS — L723 Sebaceous cyst: Secondary | ICD-10-CM | POA: Insufficient documentation

## 2021-09-15 DIAGNOSIS — L720 Epidermal cyst: Secondary | ICD-10-CM | POA: Diagnosis not present

## 2021-09-15 NOTE — Progress Notes (Incomplete)
[facility] History and Physical  Reason for Referral:*** Referring Physician: ***  Chief Complaint   New Patient (Initial Visit)     [name] is a 40 y.o. female.  HPI: ***.  The *** started *** and has had a duration of ***.  It is associated with ***.  The *** is improved with ***, and is made worse with ***.    Quality*** Context***  Past Medical History:  Diagnosis Date   Allergy    Anemia    Anginal pain (HCC)    Anxiety    CHF (congestive heart failure) (HCC)    Chronic asthmatic bronchitis ([place]) 08/17/2021   Chronic kidney disease    history of kidney stones   Coronary artery disease 09/2011   a.s/p BMS to LAD and angioplasty of RI in 09/2011 b. patent stent by cath in 02/2012, 12/2013, and 02/2016 with most recent showing 30-40% dLAD and 30-40% RCA stenosis   Depression    Diabetes mellitus    Dyspnea    Hyperlipidemia    Hypertension    Midsternal chest pain 03/03/2012   Migraines    Morbid obesity ([place])    Myocardial infarct ([place]) 09/2011   Myocardial infarction ([place]) 02/2016   Neuromuscular disorder ([place])    Palpitations 02/17/2012   Ureteral stone with hydronephrosis    Urinary tract obstruction due to kidney stone 02/15/2017    Past Surgical History:  Procedure Laterality Date   BIOPSY  10/27/2020   Procedure: BIOPSY;  Surgeon: Daneil Dolin, MD;  Location: [facility];  Service: Endoscopy;;   CARDIAC CATHETERIZATION  10/08/2011   LAD: 95% mid, Ramus: 95% ostial   CARPAL TUNNEL RELEASE     CESAREAN SECTION     CHOLECYSTECTOMY     CORONARY ANGIOPLASTY WITH STENT PLACEMENT  10/08/2011   LAD: BMS, Ramus: cutting balloon angioplasty   CORONARY STENT INTERVENTION N/A 01/14/2020   Procedure: CORONARY STENT INTERVENTION;  Surgeon: Leonie Man, MD;  Location: [facility];  Service: Cardiovascular;  Laterality: N/A;   CYSTOSCOPY WITH RETROGRADE PYELOGRAM, URETEROSCOPY AND STENT  PLACEMENT Left 02/16/2017   Procedure: CYSTOSCOPY WITH RETROGRADE PYELOGRAM, URETEROSCOPY AND STENT PLACEMENT;  Surgeon: Kathie Rhodes, MD;  Location: [facility];  Service: Urology;  Laterality: Left;   CYSTOSCOPY WITH RETROGRADE PYELOGRAM, URETEROSCOPY AND STENT PLACEMENT Left 03/28/2017   Procedure: LEFT STENT REMOVAL LEFT RETROGRADE PYELOGRAM LEFT URETEROSCOPY   LASER LITHOTRIPSY  AND  STENT REPLACEMENT;  Surgeon: Cleon Gustin, MD;  Location: [facility];  Service: Urology;  Laterality: Left;   ESOPHAGOGASTRODUODENOSCOPY (EGD) WITH PROPOFOL N/A 10/27/2020   [place]k: mild erosive reflux esophagitis, h.pylori gastritis   LEFT HEART CATH AND CORONARY ANGIOGRAPHY N/A 08/15/2018   Procedure: LEFT HEART CATH AND CORONARY ANGIOGRAPHY;  Surgeon: Martinique, Peter M, MD;  Location: [facility] [facility];  Service: Cardiovascular;  Laterality: N/A;   LEFT HEART CATH AND CORONARY ANGIOGRAPHY N/A 01/14/2020   Procedure: LEFT HEART CATH AND CORONARY ANGIOGRAPHY;  Surgeon: Leonie Man, MD;  Location: [facility] [facility];  Service: Cardiovascular;  Laterality: N/A;   LEFT HEART CATHETERIZATION WITH CORONARY ANGIOGRAM N/A 10/08/2011   Procedure: LEFT HEART CATHETERIZATION WITH CORONARY ANGIOGRAM;  Surgeon: Peter M Martinique, MD;  Location: [facility] CATH LAB;  Service: Cardiovascular;  Laterality: N/A;   LEFT HEART CATHETERIZATION WITH CORONARY ANGIOGRAM N/A 03/02/2012   Procedure: LEFT HEART CATHETERIZATION WITH CORONARY ANGIOGRAM;  Surgeon: Burnell Blanks, MD;  Location: [facility] C[facility] LAB;  Service: Cardiovascular;  Laterality: N/A;   LEFT HEART CATHETERIZATION WITH CORONARY ANGIOGRAM N/A 01/04/2014   Procedure: LEFT HEART CATHETERIZATION WITH CORONARY ANGIOGRAM;  Surgeon: Troy Sine, MD;  Location: [facility] CATH LAB;  Service: Cardiovascular;  Laterality: N/A;   right hand pinky surgery      Family History  Problem Relation Age of Onset   Heart murmur Father    Diabetes Father    Hypertension Father     Hyperlipidemia Father    Cancer Father        throat   Diabetes Mother    Cancer Mother        breast.uterine, ovarian   Asthma Son    Appendicitis Son    Hernia Son    Mental illness Son        Bipolar, personality d/o   Stroke Maternal Grandmother    Cancer Maternal Grandmother        breast   Cancer Paternal Grandfather        lung   Colon cancer Neg Hx     Social History   Tobacco Use   Smoking status: Former    Packs/day: 0.50    Years: 20.00    Pack years: 10.00    Types: Cigarettes    Start date: 03/18/1990   Smokeless tobacco: Never   Tobacco comments:    Vapes currently   Vaping Use   Vaping Use: Former  Substance Use Topics   Alcohol use: Not Currently    Alcohol/week: 0.0 standard drinks    Comment: maybe once a year   Drug use: No    Medications: {medication reviewed/display:3041432} Allergies as of 09/15/2021       Reactions   Bee Venom Anaphylaxis, Swelling   Latex Itching   Sulfa Drugs Cross Reactors Nausea And Vomiting        Medication List        Accurate as of September 15, 2021 11:18 AM. If you have any questions, ask your nurse or doctor.          STOP taking these medications    carvedilol 25 MG tablet Commonly known as: COREG Stopped by: Virl Cagey, MD       TAKE these medications    Accu-Chek Aviva Plus test strip Generic drug: glucose blood USE TO TEST BLOOD SUGAR FOUR TIMES DAILY   Accu-Chek Softclix Lancets lancets BOOD SUGAR FOUR TIMES DAILY Dx E11.9   albuterol 108 (90 Base) MCG/ACT inhaler Commonly known as: VENTOLIN HFA Inhale 2 puffs into the lungs every 4 (four) hours as needed for wheezing or shortness of breath.   aspirin 81 MG chewable tablet Chew 81 mg by mouth daily.   bisoprolol 5 MG tablet Commonly known as: ZEBETA One twice daily   blood glucose meter kit and supplies Dispense based on patient and insurance preference. Use up to four times daily as directed. (FOR  ICD-10:  E11.22). Check BG daily   budesonide-formoterol 160-4.5 MCG/ACT inhaler Commonly known as: Symbicort Take 2 puffs first thing in am and then another 2 puffs about 12 hours later.   busPIRone 10 MG tablet Commonly known as: BUSPAR Take 1 tablet (10 mg total) by mouth 2 (two) times daily.   clopidogrel 75 MG tablet Commonly known as: PLAVIX TAKE 1 TABLET BY MOUTH EVERY DAY   ezetimibe 10 MG tablet Commonly known as: ZETIA Take 10 mg by mouth daily.   fenofibrate 48 MG tablet Commonly known as: TRICOR Take 1 tablet (48 mg total) by  mouth daily.   FLUoxetine 20 MG capsule Commonly known as: PROZAC TAKE THREE CAPSULES BY MOUTH DAILY   gentamicin cream 0.1 % Commonly known as: GARAMYCIN Apply 1 application topically 2 (two) times daily.   Global Ease Inject Pen Needles 31G X 5 MM Misc Generic drug: Insulin Pen Needle USE ONCE EVERY DAY   lidocaine 5 % Commonly known as: Lidoderm Place 1 patch onto the skin daily. Remove & Discard patch within 12 hours or as directed by MD. NOT a candidate for NSAIDs due to MI/ CAD.   losartan 25 MG tablet Commonly known as: COZAAR TAKE 1 TABLET BY MOUTH DAILY   medroxyPROGESTERone 5 MG tablet Commonly known as: PROVERA TAKE 1 TABLET BY MOUTH DAILY   metFORMIN 1000 MG tablet Commonly known as: GLUCOPHAGE TAKE 1 TABLET BY MOUTH TWICE DAILY   nitroGLYCERIN 0.4 MG SL tablet Commonly known as: NITROSTAT DISSOLVE 1 TABLET UNDER THE TONGUE EVERY 5 MINUTES AS NEEDED FOR CHEST PAIN. DO NOT EXCEED A TOTAL OF 3 DOSES IN 15 MINUTES. What changed: See the new instructions.   Ozempic (0.25 or 0.5 MG/DOSE) 2 MG/1.5ML Sopn Generic drug: Semaglutide(0.25 or 0.5MG/DOS) Inject 0.5 mg into the skin once a week. STOP VICTOZA   pantoprazole 40 MG tablet Commonly known as: PROTONIX TAKE 1 TABLET BY MOUTH DAILY   potassium chloride SA 20 MEQ tablet Commonly known as: KLOR-CON M TAKE 1 TABLET BY MOUTH EVERY DAY What changed:  when to  take this reasons to take this   pregabalin 150 MG capsule Commonly known as: Lyrica Take 1 capsule (150 mg total) by mouth 2 (two) times daily.   ranolazine 1000 MG SR tablet Commonly known as: RANEXA TAKE 1 TABLET BY MOUTH TWICE DAILY   Repatha SureClick 161 MG/ML Soaj Generic drug: Evolocumab Inject 140 mg into the skin every 14 (fourteen) days.   rosuvastatin 40 MG tablet Commonly known as: CRESTOR Take 1 tablet (40 mg total) by mouth daily.   torsemide 20 MG tablet Commonly known as: DEMADEX TAKE TWO TABLETS BY MOUTH DAILY AS NEEDED FOR swelling   traMADol 50 MG tablet Commonly known as: ULTRAM Take 1-2 tablets (50-100 mg total) by mouth every 12 (twelve) hours as needed for severe pain (ONLY IF NEEDED!!).   Vascepa 1 g capsule Generic drug: icosapent Ethyl TAKE TWO CAPSULES BY MOUTH TWICE DAILY         ROS:  {Review of Systems:30496}  Blood pressure 129/83, pulse 87, temperature 97.6 F (36.4 C), temperature source Other (Comment), resp. rate 18, height 5' 10"  (1.778 m), weight (!) 384 lb (174.2 kg), SpO2 98 %. Physical Exam  Results: No results found for this or any previous visit (from the past 48 hour(s)).  No results found.   Assessment & Plan:  Siarra Gilkerson Butcher is a 40 y.o. female with *** -*** -*** -Follow up ***  All questions were answered to the satisfaction of the patient and family***.  The risk and benefits of *** were discussed including but not limited to ***.  After careful consideration, Janani D Njie has decided to ***.    Virl Cagey 09/15/2021, 11:18 AM

## 2021-09-15 NOTE — Progress Notes (Signed)
[facility] History and Physical  Reason for Referral: Left thigh mass  Referring Physician: Janora Norlander, DO   Chief Complaint   New Patient (Initial Visit)     [name] is a 40 y.o. female.  HPI: [name] is a 40 yo who says that she has noticed a lump on her left lateral thigh and that it is getting larger. It has not been infected or swollen but it is causing her some discomfort where it is growing and pressing on her thigh.  She is worried about it getting infected and wants to go ahead and get it removed. She has been off her Plavix now for over 4 days. She says that she is interested in getting this removed today if possible.   Past Medical History:  Diagnosis Date   Allergy    Anemia    Anginal pain (HCC)    Anxiety    CHF (congestive heart failure) ([place])    Chronic asthmatic bronchitis ([place]) 08/17/2021   Chronic kidney disease    history of kidney stones   Coronary artery disease 09/2011   a.s/p BMS to LAD and angioplasty of RI in 09/2011 b. patent stent by cath in 02/2012, 12/2013, and 02/2016 with most recent showing 30-40% dLAD and 30-40% RCA stenosis   Depression    Diabetes mellitus    Dyspnea    Hyperlipidemia    Hypertension    Midsternal chest pain 03/03/2012   Migraines    Morbid obesity ([place])    Myocardial infarct ([place]) 09/2011   Myocardial infarction ([place]) 02/2016   Neuromuscular disorder ([place])    Palpitations 02/17/2012   Ureteral stone with hydronephrosis    Urinary tract obstruction due to kidney stone 02/15/2017    Past Surgical History:  Procedure Laterality Date   BIOPSY  10/27/2020   Procedure: BIOPSY;  Surgeon: Daneil Dolin, MD;  [facility]: [facility];  Service: Endoscopy;;   CARDIAC CATHETERIZATION  10/08/2011   LAD: 95% mid, Ramus: 95% ostial   CARPAL TUNNEL RELEASE     CESAREAN SECTION     CHOLECYSTECTOMY     CORONARY ANGIOPLASTY WITH STENT PLACEMENT  10/08/2011   LAD: BMS, Ramus: cutting balloon  angioplasty   CORONARY STENT INTERVENTION N/A 01/14/2020   Procedure: CORONARY STENT INTERVENTION;  Surgeon: Leonie Man, MD;  Location: [place] [facility];  Service: Cardiovascular;  Laterality: N/A;   CYSTOSCOPY WITH RETROGRADE PYELOGRAM, URETEROSCOPY AND STENT PLACEMENT Left 02/16/2017   Procedure: CYSTOSCOPY WITH RETROGRADE PYELOGRAM, URETEROSCOPY AND STENT PLACEMENT;  Surgeon: Kathie Rhodes, MD;  Location: [facility];  Service: Urology;  Laterality: Left;   CYSTOSCOPY WITH RETROGRADE PYELOGRAM, URETEROSCOPY AND STENT PLACEMENT Left 03/28/2017   Procedure: LEFT STENT REMOVAL LEFT RETROGRADE PYELOGRAM LEFT URETEROSCOPY   LASER LITHOTRIPSY  AND  STENT REPLACEMENT;  Surgeon: Cleon Gustin, MD;  Location: [facility];  Service: Urology;  Laterality: Left;   ESOPHAGOGASTRODUODENOSCOPY (EGD) WITH PROPOFOL N/A 10/27/2020   Rourk: mild erosive reflux esophagitis, h.pylori gastritis   LEFT HEART CATH AND CORONARY ANGIOGRAPHY N/A 08/15/2018   Procedure: LEFT HEART CATH AND CORONARY ANGIOGRAPHY;  Surgeon: Martinique, Peter M, MD;  Location: [place] [facility];  Service: Cardiovascular;  Laterality: N/A;   LEFT HEART CATH AND CORONARY ANGIOGRAPHY N/A 01/14/2020   Procedure: LEFT HEART CATH AND CORONARY ANGIOGRAPHY;  Surgeon: Leonie Man, MD;  Location: [place] [facility];  Service: Cardiovascular;  Laterality: N/A;   LEFT HEART CATHETERIZATION WITH CORONARY ANGIOGRAM N/A  10/08/2011   Procedure: LEFT HEART CATHETERIZATION WITH CORONARY ANGIOGRAM;  Surgeon: Peter M Martinique, MD;  Location: [facility] CATH LAB;  Service: Cardiovascular;  Laterality: N/A;   LEFT HEART CATHETERIZATION WITH CORONARY ANGIOGRAM N/A 03/02/2012   Procedure: LEFT HEART CATHETERIZATION WITH CORONARY ANGIOGRAM;  Surgeon: Burnell Blanks, MD;  Location: [facility] CATH LAB;  Service: Cardiovascular;  Laterality: N/A;   LEFT HEART CATHETERIZATION WITH CORONARY ANGIOGRAM N/A 01/04/2014   Procedure: LEFT HEART CATHETERIZATION WITH CORONARY  ANGIOGRAM;  Surgeon: Troy Sine, MD;  Location: [facility] CATH LAB;  Service: Cardiovascular;  Laterality: N/A;   right hand pinky surgery      Family History  Problem Relation Age of Onset   Heart murmur Father    Diabetes Father    Hypertension Father    Hyperlipidemia Father    Cancer Father        throat   Diabetes Mother    Cancer Mother        breast.uterine, ovarian   Asthma Son    Appendicitis Son    Hernia Son    Mental illness Son        Bipolar, personality d/o   Stroke Maternal Grandmother    Cancer Maternal Grandmother        breast   Cancer Paternal Grandfather        lung   Colon cancer Neg Hx     Social History   Tobacco Use   Smoking status: Former    Packs/day: 0.50    Years: 20.00    Pack years: 10.00    Types: Cigarettes    Start date: 03/18/1990   Smokeless tobacco: Never   Tobacco comments:    Vapes currently   Vaping Use   Vaping Use: Former  Substance Use Topics   Alcohol use: Not Currently    Alcohol/week: 0.0 standard drinks    Comment: maybe once a year   Drug use: No    Medications: I have reviewed the patient's current medications. Allergies as of 09/15/2021       Reactions   Bee Venom Anaphylaxis, Swelling   Latex Itching   Sulfa Drugs Cross Reactors Nausea And Vomiting        Medication List        Accurate as of September 15, 2021 12:01 PM. If you have any questions, ask your nurse or doctor.          STOP taking these medications    carvedilol 25 MG tablet Commonly known as: COREG Stopped by: Virl Cagey, MD       TAKE these medications    Accu-Chek Aviva Plus test strip Generic drug: glucose blood USE TO TEST BLOOD SUGAR FOUR TIMES DAILY   Accu-Chek Softclix Lancets lancets BOOD SUGAR FOUR TIMES DAILY Dx E11.9   albuterol 108 (90 Base) MCG/ACT inhaler Commonly known as: VENTOLIN HFA Inhale 2 puffs into the lungs every 4 (four) hours as needed for wheezing or shortness of breath.   aspirin 81  MG chewable tablet Chew 81 mg by mouth daily.   bisoprolol 5 MG tablet Commonly known as: ZEBETA One twice daily   blood glucose meter kit and supplies Dispense based on patient and insurance preference. Use up to four times daily as directed. (FOR ICD-10:  E11.22). Check BG daily   budesonide-formoterol 160-4.5 MCG/ACT inhaler Commonly known as: Symbicort Take 2 puffs first thing in am and then another 2 puffs about 12 hours later.   busPIRone 10 MG tablet  Commonly known as: BUSPAR Take 1 tablet (10 mg total) by mouth 2 (two) times daily.   clopidogrel 75 MG tablet Commonly known as: PLAVIX TAKE 1 TABLET BY MOUTH EVERY DAY   ezetimibe 10 MG tablet Commonly known as: ZETIA Take 10 mg by mouth daily.   fenofibrate 48 MG tablet Commonly known as: TRICOR Take 1 tablet (48 mg total) by mouth daily.   FLUoxetine 20 MG capsule Commonly known as: PROZAC TAKE THREE CAPSULES BY MOUTH DAILY   gentamicin cream 0.1 % Commonly known as: GARAMYCIN Apply 1 application topically 2 (two) times daily.   Global Ease Inject Pen Needles 31G X 5 MM Misc Generic drug: Insulin Pen Needle USE ONCE EVERY DAY   lidocaine 5 % Commonly known as: Lidoderm Place 1 patch onto the skin daily. Remove & Discard patch within 12 hours or as directed by MD. NOT a candidate for NSAIDs due to MI/ CAD.   losartan 25 MG tablet Commonly known as: COZAAR TAKE 1 TABLET BY MOUTH DAILY   medroxyPROGESTERone 5 MG tablet Commonly known as: PROVERA TAKE 1 TABLET BY MOUTH DAILY   metFORMIN 1000 MG tablet Commonly known as: GLUCOPHAGE TAKE 1 TABLET BY MOUTH TWICE DAILY   nitroGLYCERIN 0.4 MG SL tablet Commonly known as: NITROSTAT DISSOLVE 1 TABLET UNDER THE TONGUE EVERY 5 MINUTES AS NEEDED FOR CHEST PAIN. DO NOT EXCEED A TOTAL OF 3 DOSES IN 15 MINUTES. What changed: See the new instructions.   Ozempic (0.25 or 0.5 MG/DOSE) 2 MG/1.5ML Sopn Generic drug: Semaglutide(0.25 or 0.5MG/DOS) Inject 0.5 mg into  the skin once a week. STOP VICTOZA   pantoprazole 40 MG tablet Commonly known as: PROTONIX TAKE 1 TABLET BY MOUTH DAILY   potassium chloride SA 20 MEQ tablet Commonly known as: KLOR-CON M TAKE 1 TABLET BY MOUTH EVERY DAY What changed:  when to take this reasons to take this   pregabalin 150 MG capsule Commonly known as: Lyrica Take 1 capsule (150 mg total) by mouth 2 (two) times daily.   ranolazine 1000 MG SR tablet Commonly known as: RANEXA TAKE 1 TABLET BY MOUTH TWICE DAILY   Repatha SureClick 409 MG/ML Soaj Generic drug: Evolocumab Inject 140 mg into the skin every 14 (fourteen) days.   rosuvastatin 40 MG tablet Commonly known as: CRESTOR Take 1 tablet (40 mg total) by mouth daily.   torsemide 20 MG tablet Commonly known as: DEMADEX TAKE TWO TABLETS BY MOUTH DAILY AS NEEDED FOR swelling   traMADol 50 MG tablet Commonly known as: ULTRAM Take 1-2 tablets (50-100 mg total) by mouth every 12 (twelve) hours as needed for severe pain (ONLY IF NEEDED!!).   Vascepa 1 g capsule Generic drug: icosapent Ethyl TAKE TWO CAPSULES BY MOUTH TWICE DAILY         ROS:  A comprehensive review of systems was negative except for: Respiratory: positive for wheezing and cough and SOB Genitourinary: positive for frequency Musculoskeletal: positive for back pain and joint pain  Blood pressure 129/83, pulse 87, temperature 97.6 F (36.4 C), temperature source Other (Comment), resp. rate 18, height _0  (1.778 m), weight (!) 384 lb (174.2 kg), SpO2 98 %. Physical Exam Vitals reviewed.  Constitutional:      Appearance: Normal appearance.  HENT:     Head: Normocephalic.     Nose: Nose normal.  Eyes:     Extraocular Movements: Extraocular movements intact.  Cardiovascular:     Rate and Rhythm: Normal rate and regular rhythm.  Pulmonary:  Effort: Pulmonary effort is normal.     Breath sounds: Normal breath sounds.  Abdominal:     General: There is no distension.      Palpations: Abdomen is soft.     Tenderness: There is no abdominal tenderness.  Musculoskeletal:        General: Normal range of motion.     Cervical back: Normal range of motion.     Comments: Left mid thigh lateral with a 2cm superficial mobile mass, no erythema or drainage, no central pit but almost a nipple forming centrally  Skin:    General: Skin is warm.  Neurological:     General: No focal deficit present.     Mental Status: She is alert and oriented to person, place, and time.  Psychiatric:        Mood and Affect: Mood normal.        Behavior: Behavior normal.        Thought Content: Thought content normal.    Procedure: Excision of left thigh cyst 2cm  Description: The left thigh was prepped and draped. Lidocaine 1% was injected. An incision was made over the mass and carried down into the subcutaneous tissue. With sharp dissection with scissors, the cyst was excised in its entirety. There was some spillage of a grayish keratin contents. The cyst was 2cm in size. The cavity was flushed copiously with saline. The wound was closed deep with 3-0 Vicryl suture and the skin was closed with 3-0 Nylon suture. Gauze and a tegaderm were placed. The patient tolerated th procedure well.    Assessment & Plan:  [name] is a 40 y.o. female with a cyst on her left thigh that we excised in the clinic today. See the note above.  Tylenol, ibuprofen and tramadol as needed for pain. Remove bandage in 48 hours. Replace with bandaid daily. Can place neosporin on the area. Will get sutures out in 2 weeks. Hold plavix for another 48 hours.   All questions were answered to the satisfaction of the patient.    Future Appointments  Date Time Provider [place]  09/29/2021 11:30 AM Virl Cagey, MD [facility] None  10/05/2021  2:15 PM Edrick Kins, DPM [facility]  10/12/2021 11:15 AM Lorenda Peck, MD [facility]  11/30/2021  8:15 AM Janora Norlander, DO  [facility] None    Virl Cagey 09/15/2021, 12:01 PM

## 2021-09-15 NOTE — Patient Instructions (Signed)
Tylenol, ibuprofen and tramadol as needed for pain. Remove bandage in 48 hours. Replace with bandaid daily. Can place neosporin on the area. Will get sutures out in 2 weeks.

## 2021-09-17 ENCOUNTER — Other Ambulatory Visit: Payer: Self-pay | Admitting: Cardiology

## 2021-09-17 ENCOUNTER — Other Ambulatory Visit: Payer: Self-pay | Admitting: Family Medicine

## 2021-09-17 DIAGNOSIS — F411 Generalized anxiety disorder: Secondary | ICD-10-CM

## 2021-09-17 DIAGNOSIS — R142 Eructation: Secondary | ICD-10-CM

## 2021-09-17 DIAGNOSIS — R110 Nausea: Secondary | ICD-10-CM

## 2021-09-17 DIAGNOSIS — E1165 Type 2 diabetes mellitus with hyperglycemia: Secondary | ICD-10-CM

## 2021-09-17 LAB — PATHOLOGY REPORT

## 2021-09-17 LAB — TISSUE SPECIMEN

## 2021-09-29 ENCOUNTER — Encounter: Payer: Medicaid Other | Admitting: General Surgery

## 2021-10-04 ENCOUNTER — Other Ambulatory Visit: Payer: Self-pay | Admitting: Cardiology

## 2021-10-05 ENCOUNTER — Other Ambulatory Visit: Payer: Self-pay

## 2021-10-05 ENCOUNTER — Ambulatory Visit: Payer: Medicaid Other | Admitting: Podiatry

## 2021-10-05 DIAGNOSIS — L600 Ingrowing nail: Secondary | ICD-10-CM

## 2021-10-11 NOTE — Progress Notes (Signed)
° °  Subjective: 41 y.o. female presents today status post permanent nail avulsion procedure of the lateral border of the left great toe that was performed on 09/07/2021.  Patient states that she is doing well.  She continues to have some tenderness with drainage to the area.  She presents for follow-up treatment and evaluation.   Past Medical History:  Diagnosis Date   Allergy    Anemia    Anginal pain (HCC)    Anxiety    CHF (congestive heart failure) (HCC)    Chronic asthmatic bronchitis (HCC) 08/17/2021   Chronic kidney disease    history of kidney stones   Coronary artery disease 09/2011   a.s/p BMS to LAD and angioplasty of RI in 09/2011 b. patent stent by cath in 02/2012, 12/2013, and 02/2016 with most recent showing 30-40% dLAD and 30-40% RCA stenosis   Depression    Diabetes mellitus    Dyspnea    Hyperlipidemia    Hypertension    Midsternal chest pain 03/03/2012   Migraines    Morbid obesity (HCC)    Myocardial infarct (HCC) 09/2011   Myocardial infarction (HCC) 02/2016   Neuromuscular disorder (HCC)    Palpitations 02/17/2012   Ureteral stone with hydronephrosis    Urinary tract obstruction due to kidney stone 02/15/2017    Objective: Skin is warm, dry and supple. Nail and respective nail fold appears to be healing appropriately. Open wound to the associated nail fold with a granular wound base and moderate amount of fibrotic tissue. Minimal drainage noted. Mild erythema around the periungual region likely due to phenol chemical matricectomy.  Assessment: #1 s/p partial permanent nail matrixectomy lateral border left great toe   Plan of care: #1 patient was evaluated  #2 light debridement of open wound was performed to the periungual border of the respective toe using a currette. Antibiotic ointment and Band-Aid was applied. #3  Because of the some of the erythema around the nail matricectomy site antibiotics were discussed and recommended today.  The patient has  Augmentin at home.  Recommend that she takes Augmentin 875/125mg  2 times daily x1 week.  Patient will do that #4 return to clinic in 2 weeks for scheduled routine care and follow-up   Felecia Shelling, DPM [facility]  Dr. Felecia Shelling, DPM    [address] [place], [place] [address]                Office [phone]  Fax [phone]

## 2021-10-12 ENCOUNTER — Ambulatory Visit (INDEPENDENT_AMBULATORY_CARE_PROVIDER_SITE_OTHER): Payer: Medicaid Other | Admitting: Podiatry

## 2021-10-12 ENCOUNTER — Encounter: Payer: Self-pay | Admitting: Podiatry

## 2021-10-12 ENCOUNTER — Other Ambulatory Visit: Payer: Self-pay

## 2021-10-12 DIAGNOSIS — E1142 Type 2 diabetes mellitus with diabetic polyneuropathy: Secondary | ICD-10-CM | POA: Diagnosis not present

## 2021-10-12 DIAGNOSIS — M79675 Pain in left toe(s): Secondary | ICD-10-CM

## 2021-10-12 DIAGNOSIS — B351 Tinea unguium: Secondary | ICD-10-CM | POA: Diagnosis not present

## 2021-10-12 DIAGNOSIS — M79674 Pain in right toe(s): Secondary | ICD-10-CM | POA: Diagnosis not present

## 2021-10-12 NOTE — Progress Notes (Signed)
°  Subjective:  Patient ID: [name], female    DOB: 12/14/1980,   MRN: [id number]  Chief Complaint  Patient presents with   Nail Problem    Pt is here for RAC. Also wants left great toe looked at. Has an ingrown toenail removed     41 y.o. female presents for diabetic foot care. Patient recently had a left ingrown toenail removed and healing up well from that. Relates thickened elongated and painful toenails that she is unable to trim herself. She is diabetic and her last A1c was 8.7 on 09/01/21  . Denies any other pedal complaints. Denies n/v/f/c.   Past Medical History:  Diagnosis Date   Allergy    Anemia    Anginal pain (HCC)    Anxiety    CHF (congestive heart failure) (HCC)    Chronic asthmatic bronchitis (HCC) 08/17/2021   Chronic kidney disease    history of kidney stones   Coronary artery disease 09/2011   a.s/p BMS to LAD and angioplasty of RI in 09/2011 b. patent stent by cath in 02/2012, 12/2013, and 02/2016 with most recent showing 30-40% dLAD and 30-40% RCA stenosis   Depression    Diabetes mellitus    Dyspnea    Hyperlipidemia    Hypertension    Midsternal chest pain 03/03/2012   Migraines    Morbid obesity (HCC)    Myocardial infarct (HCC) 09/2011   Myocardial infarction (HCC) 02/2016   Neuromuscular disorder (HCC)    Palpitations 02/17/2012   Ureteral stone with hydronephrosis    Urinary tract obstruction due to kidney stone 02/15/2017    Objective:  Physical Exam: Vascular: DP/PT pulses 2/4 bilateral. CFT <3 seconds. Normal hair growth on digits. No edema.  Skin. No lacerations or abrasions bilateral feet. Nails 1-5 are thickened discolored and elongated with subungual debris.  Left hallux nail lateral border healing well with minimal tenderness.  Musculoskeletal: MMT 5/5 bilateral lower extremities in DF, PF, Inversion and Eversion. Deceased ROM in DF of ankle joint.  Neurological: Sensation intact to light touch.   Assessment:   1. Type 2  diabetes mellitus with diabetic polyneuropathy, unspecified whether long term insulin use (HCC)   2. Pain due to onychomycosis of toenails of both feet      Plan:  Patient was evaluated and treated and all questions answered. -Discussed and educated patient on diabetic foot care, especially with  regards to the vascular, neurological and musculoskeletal systems.  -Stressed the importance of good glycemic control and the detriment of not  controlling glucose levels in relation to the foot. -Discussed supportive shoes at all times and checking feet regularly.  -Mechanically debrided all nails 1-5 bilateral using sterile nail nipper and filed with dremel without incident  -Hallux nail healing well may discontinue soaks and bandaids.  -Answered all patient questions -Patient to return  in 3 months for at risk foot care -Patient advised to call the office if any problems or questions arise in the meantime.   Louann Sjogren, DPM

## 2021-10-15 ENCOUNTER — Ambulatory Visit (HOSPITAL_COMMUNITY): Payer: Medicaid Other

## 2021-10-17 ENCOUNTER — Encounter: Payer: Self-pay | Admitting: Family Medicine

## 2021-10-19 ENCOUNTER — Ambulatory Visit (HOSPITAL_COMMUNITY)
Admission: RE | Admit: 2021-10-19 | Discharge: 2021-10-19 | Disposition: A | Discharge status: Home / Self Care | Payer: Medicaid Other | Source: Ambulatory Visit | Attending: Family Medicine | Admitting: Family Medicine

## 2021-10-19 ENCOUNTER — Other Ambulatory Visit: Payer: Self-pay

## 2021-10-19 DIAGNOSIS — Z1231 Encounter for screening mammogram for malignant neoplasm of breast: Secondary | ICD-10-CM | POA: Diagnosis not present

## 2021-10-27 ENCOUNTER — Other Ambulatory Visit: Payer: Self-pay | Admitting: Family Medicine

## 2021-10-27 ENCOUNTER — Other Ambulatory Visit: Payer: Self-pay | Admitting: Podiatry

## 2021-10-27 DIAGNOSIS — E1169 Type 2 diabetes mellitus with other specified complication: Secondary | ICD-10-CM

## 2021-10-27 DIAGNOSIS — F411 Generalized anxiety disorder: Secondary | ICD-10-CM

## 2021-10-30 ENCOUNTER — Other Ambulatory Visit: Payer: Self-pay | Admitting: Family Medicine

## 2021-10-30 DIAGNOSIS — F411 Generalized anxiety disorder: Secondary | ICD-10-CM

## 2021-10-30 DIAGNOSIS — F418 Other specified anxiety disorders: Secondary | ICD-10-CM

## 2021-11-14 ENCOUNTER — Other Ambulatory Visit: Payer: Self-pay | Admitting: Family Medicine

## 2021-11-14 DIAGNOSIS — M5442 Lumbago with sciatica, left side: Secondary | ICD-10-CM

## 2021-11-14 DIAGNOSIS — G8929 Other chronic pain: Secondary | ICD-10-CM

## 2021-11-29 ENCOUNTER — Other Ambulatory Visit: Payer: Self-pay | Admitting: Family Medicine

## 2021-11-29 DIAGNOSIS — F411 Generalized anxiety disorder: Secondary | ICD-10-CM

## 2021-11-29 DIAGNOSIS — R142 Eructation: Secondary | ICD-10-CM

## 2021-11-29 DIAGNOSIS — E1165 Type 2 diabetes mellitus with hyperglycemia: Secondary | ICD-10-CM

## 2021-11-29 DIAGNOSIS — R110 Nausea: Secondary | ICD-10-CM

## 2021-11-29 DIAGNOSIS — E1140 Type 2 diabetes mellitus with diabetic neuropathy, unspecified: Secondary | ICD-10-CM

## 2021-11-30 ENCOUNTER — Ambulatory Visit: Payer: Medicaid Other | Admitting: Family Medicine

## 2021-11-30 NOTE — Telephone Encounter (Signed)
Pt has appt on 12/04/2021 LMTCB if she needed more refills before appt ?

## 2021-11-30 NOTE — Telephone Encounter (Signed)
Appointment today, have to have your permission to refill Lyrica ?

## 2021-11-30 NOTE — Telephone Encounter (Signed)
Patient needs appointment for refills, please call to schedule ?

## 2021-12-01 ENCOUNTER — Encounter: Payer: Self-pay | Admitting: Family Medicine

## 2021-12-04 ENCOUNTER — Ambulatory Visit: Payer: Medicaid Other | Admitting: Family Medicine

## 2021-12-04 ENCOUNTER — Encounter: Payer: Self-pay | Admitting: Family Medicine

## 2021-12-04 ENCOUNTER — Other Ambulatory Visit: Payer: Self-pay | Admitting: Family Medicine

## 2021-12-04 VITALS — BP 124/81 | HR 102 | Temp 97.8°F | Ht 70.0 in | Wt 386.0 lb

## 2021-12-04 DIAGNOSIS — R142 Eructation: Secondary | ICD-10-CM

## 2021-12-04 DIAGNOSIS — F411 Generalized anxiety disorder: Secondary | ICD-10-CM | POA: Diagnosis not present

## 2021-12-04 DIAGNOSIS — M5441 Lumbago with sciatica, right side: Secondary | ICD-10-CM

## 2021-12-04 DIAGNOSIS — E1140 Type 2 diabetes mellitus with diabetic neuropathy, unspecified: Secondary | ICD-10-CM | POA: Diagnosis not present

## 2021-12-04 DIAGNOSIS — E1165 Type 2 diabetes mellitus with hyperglycemia: Secondary | ICD-10-CM

## 2021-12-04 DIAGNOSIS — E785 Hyperlipidemia, unspecified: Secondary | ICD-10-CM

## 2021-12-04 DIAGNOSIS — R110 Nausea: Secondary | ICD-10-CM

## 2021-12-04 DIAGNOSIS — G894 Chronic pain syndrome: Secondary | ICD-10-CM | POA: Diagnosis not present

## 2021-12-04 DIAGNOSIS — I2510 Atherosclerotic heart disease of native coronary artery without angina pectoris: Secondary | ICD-10-CM

## 2021-12-04 DIAGNOSIS — R0602 Shortness of breath: Secondary | ICD-10-CM | POA: Diagnosis not present

## 2021-12-04 DIAGNOSIS — E1169 Type 2 diabetes mellitus with other specified complication: Secondary | ICD-10-CM | POA: Diagnosis not present

## 2021-12-04 DIAGNOSIS — M5442 Lumbago with sciatica, left side: Secondary | ICD-10-CM

## 2021-12-04 DIAGNOSIS — I152 Hypertension secondary to endocrine disorders: Secondary | ICD-10-CM

## 2021-12-04 DIAGNOSIS — E1159 Type 2 diabetes mellitus with other circulatory complications: Secondary | ICD-10-CM

## 2021-12-04 DIAGNOSIS — J690 Pneumonitis due to inhalation of food and vomit: Secondary | ICD-10-CM | POA: Diagnosis not present

## 2021-12-04 DIAGNOSIS — G8929 Other chronic pain: Secondary | ICD-10-CM

## 2021-12-04 LAB — BAYER DCA HB A1C WAIVED: HB A1C (BAYER DCA - WAIVED): 6.9 % — ABNORMAL HIGH (ref 4.8–5.6)

## 2021-12-04 MED ORDER — ALBUTEROL SULFATE HFA 108 (90 BASE) MCG/ACT IN AERS: 2.0000 | INHALATION_SPRAY | RESPIRATORY_TRACT | 5 refills | Status: DC | PRN

## 2021-12-04 MED ORDER — TRAMADOL HCL 50 MG PO TABS: 50.0000 mg | ORAL_TABLET | Freq: Two times a day (BID) | ORAL | 3 refills | Status: DC | PRN

## 2021-12-04 MED ORDER — AMOXICILLIN-POT CLAVULANATE 875-125 MG PO TABS
1.0000 | ORAL_TABLET | Freq: Two times a day (BID) | ORAL | 0 refills | Status: DC
Start: 2021-12-04 — End: 2022-02-17

## 2021-12-04 MED ORDER — METFORMIN HCL 1000 MG PO TABS: 1000.0000 mg | ORAL_TABLET | Freq: Two times a day (BID) | ORAL | 3 refills | Status: DC

## 2021-12-04 MED ORDER — PREGABALIN 150 MG PO CAPS: 150.0000 mg | ORAL_CAPSULE | Freq: Two times a day (BID) | ORAL | 3 refills | Status: DC

## 2021-12-04 MED ORDER — FLUOXETINE HCL 20 MG PO CAPS: 60.0000 mg | ORAL_CAPSULE | Freq: Every day | ORAL | 3 refills | Status: DC

## 2021-12-04 MED ORDER — FLUCONAZOLE 150 MG PO TABS: 150.0000 mg | ORAL_TABLET | Freq: Once | ORAL | 0 refills | Status: AC

## 2021-12-04 NOTE — Addendum Note (Signed)
Addended by: Janora Norlander on: 12/04/2021 08:28 PM ? ? Modules accepted: Orders ? ?

## 2021-12-04 NOTE — Progress Notes (Signed)
Subjective: CC:DM PCP: Janora Norlander, DO YTK:PTWSFKCLE [name] is a 41 y.o. female presenting to clinic today for:  1. Type 2 Diabetes with hypertension, hyperlipidemia:  Sugar was uncontrolled when we had her checkup back in December.  We transitioned her from Victoza to [place] and she seems to be doing much better on this.  She is compliant with all of her medications.  Last eye exam: Up-to-date Last foot exam: Up-to-date (sees podiatry) Last A1c:  Lab Results  Component Value Date   HGBA1C 8.7 (H) 09/01/2021   Nephropathy screen indicated?:  Up-to-date Last flu, zoster and/or pneumovax:  Immunization History  Administered Date(s) Administered   Influenza Split 10/10/2011   Influenza,inj,Quad PF,6+ Mos 10/28/2017, 06/26/2018, 06/05/2019, 08/19/2020, 06/18/2021   Influenza-Unspecified 06/26/2018, 06/05/2019   PFIZER(Purple Top)SARS-COV-2 Vaccination 03/27/2020   Pneumococcal Conjugate-13 10/28/2017   Pneumococcal Polysaccharide-23 10/10/2011   Tdap 01/25/2018    ROS: No chest pain, blurred vision  2.?  Pneumonia Patient was suffering from a food poisoning in the last couple of weeks and she unfortunately aspirated some of her vomit in the middle of the night over the weekend.  She has been having coughing and wheezing since.  Fevers have been undulating.  Her sputum is brown.  She needs a new albuterol inhaler as well.  3.  Chronic pain syndrome Patient reports ongoing chronic pain that is debilitating it sometimes.  She is not able to localize pain to any particular region she just "feels like her muscles are being ripped off her bones" sometimes.  She has both Lyrica and tramadol on hand for as needed use but she notes that these are ineffective and she would like to pursue additional treatments.  ROS: Per HPI  Allergies  Allergen Reactions   Bee Venom Anaphylaxis and Swelling   Latex Itching   Sulfa Drugs Cross Reactors Nausea And Vomiting   Past Medical  History:  Diagnosis Date   Allergy    Anemia    Anginal pain (HCC)    Anxiety    CHF (congestive heart failure) (HCC)    Chronic asthmatic bronchitis ([place]) 08/17/2021   Chronic kidney disease    history of kidney stones   Coronary artery disease 09/2011   a.s/p BMS to LAD and angioplasty of RI in 09/2011 b. patent stent by cath in 02/2012, 12/2013, and 02/2016 with most recent showing 30-40% dLAD and 30-40% RCA stenosis   Depression    Diabetes mellitus    Dyspnea    Hyperlipidemia    Hypertension    Midsternal chest pain 03/03/2012   Migraines    Morbid obesity ([place])    Myocardial infarct ([place]) 09/2011   Myocardial infarction ([place]) 02/2016   Neuromuscular disorder ([place])    Palpitations 02/17/2012   Ureteral stone with hydronephrosis    Urinary tract obstruction due to kidney stone 02/15/2017    Current Outpatient Medications:    ACCU-CHEK AVIVA PLUS test strip, USE TO TEST BLOOD SUGAR FOUR TIMES DAILY, Disp: 400 strip, Rfl: 3   Accu-Chek Softclix Lancets lancets, BOOD SUGAR FOUR TIMES DAILY Dx E11.9, Disp: 100 each, Rfl: 11   albuterol (VENTOLIN HFA) 108 (90 Base) MCG/ACT inhaler, Inhale 2 puffs into the lungs every 4 (four) hours as needed for wheezing or shortness of breath., Disp: 8 g, Rfl: 5   aspirin 81 MG chewable tablet, Chew 81 mg by mouth daily., Disp: , Rfl:    bisoprolol (ZEBETA) 5 MG tablet, One twice daily, Disp: 60 tablet, Rfl:  11   blood glucose meter kit and supplies, Dispense based on patient and insurance preference. Use up to four times daily as directed. (FOR ICD-10:  E11.22). Check BG daily, Disp: 1 each, Rfl: 0   budesonide-formoterol (SYMBICORT) 160-4.5 MCG/ACT inhaler, Take 2 puffs first thing in am and then another 2 puffs about 12 hours later., Disp: 1 each, Rfl: 11   busPIRone (BUSPAR) 10 MG tablet, TAKE 1 TABLET BY MOUTH TWICE DAILY, Disp: 60 tablet, Rfl: 1   clopidogrel (PLAVIX) 75 MG tablet, TAKE 1 TABLET BY MOUTH EVERY DAY, Disp: 94 tablet, Rfl: 3    Evolocumab (REPATHA SURECLICK) 329 MG/ML SOAJ, Inject 140 mg into the skin every 14 (fourteen) days., Disp: 2 mL, Rfl: 6   ezetimibe (ZETIA) 10 MG tablet, Take 10 mg by mouth daily., Disp: , Rfl:    fenofibrate (TRICOR) 48 MG tablet, TAKE 1 TABLET BY MOUTH DAILY, Disp: 30 tablet, Rfl: 1   FLUoxetine (PROZAC) 20 MG capsule, TAKE THREE CAPSULES BY MOUTH DAILY, Disp: 90 capsule, Rfl: 0   gentamicin cream (GARAMYCIN) 0.1 %, APPLY TO THE AFFECTED AREA(S) TWICE DAILY, Disp: 30 g, Rfl: 1   Insulin Pen Needle (GLOBAL EASE INJECT PEN NEEDLES) 31G X 5 MM MISC, USE ONCE EVERY DAY, Disp: 100 each, Rfl: 3   lidocaine (LIDODERM) 5 %, Place 1 patch onto the skin daily. Remove & Discard patch within 12 hours or as directed by MD. NOT a candidate for NSAIDs due to MI/ CAD., Disp: 30 patch, Rfl: prn   losartan (COZAAR) 25 MG tablet, TAKE 1 TABLET BY MOUTH DAILY, Disp: 90 tablet, Rfl: 1   medroxyPROGESTERone (PROVERA) 5 MG tablet, TAKE 1 TABLET BY MOUTH DAILY, Disp: 90 tablet, Rfl: 0   metFORMIN (GLUCOPHAGE) 1000 MG tablet, TAKE 1 TABLET BY MOUTH TWICE DAILY, Disp: 60 tablet, Rfl: 1   nitroGLYCERIN (NITROSTAT) 0.4 MG SL tablet, DISSOLVE 1 TABLET UNDER THE TONGUE EVERY 5 MINUTES AS NEEDED FOR CHEST PAIN. DO NOT EXCEED A TOTAL OF 3 DOSES IN 15 MINUTES. (Patient taking differently: Place 0.4 mg under the tongue every 5 (five) minutes as needed for chest pain.), Disp: 25 tablet, Rfl: 3   pantoprazole (PROTONIX) 40 MG tablet, TAKE 1 TABLET BY MOUTH DAILY, Disp: 30 tablet, Rfl: 1   potassium chloride SA (KLOR-CON M) 20 MEQ tablet, Take 1 tablet (20 mEq total) by mouth daily as needed (when taking Torsemide)., Disp: 90 tablet, Rfl: 1   pregabalin (LYRICA) 150 MG capsule, Take 1 capsule (150 mg total) by mouth 2 (two) times daily., Disp: 60 capsule, Rfl: 3   ranolazine (RANEXA) 1000 MG SR tablet, TAKE 1 TABLET BY MOUTH TWICE DAILY, Disp: 180 tablet, Rfl: 3   rosuvastatin (CRESTOR) 40 MG tablet, Take 1 tablet (40 mg total) by  mouth daily., Disp: 30 tablet, Rfl: 6   Semaglutide,0.25 or 0.5MG/DOS, (OZEMPIC, 0.25 OR 0.5 MG/DOSE,) 2 MG/1.5ML SOPN, Inject 0.5 mg into the skin once a week. STOP VICTOZA, Disp: 4.5 mL, Rfl: 1   torsemide (DEMADEX) 20 MG tablet, TAKE TWO TABLETS BY MOUTH DAILY AS NEEDED FOR swelling, Disp: 180 tablet, Rfl: 3   traMADol (ULTRAM) 50 MG tablet, Take 1-2 tablets (50-100 mg total) by mouth every 12 (twelve) hours as needed for severe pain (ONLY IF NEEDED!!)., Disp: 60 tablet, Rfl: 3   VASCEPA 1 g capsule, TAKE TWO CAPSULES BY MOUTH TWICE DAILY, Disp: 120 capsule, Rfl: 11 Social History   Socioeconomic History   Marital status: Married  Spouse name: Barnabas Lister   Number of children: 2   Years of education: 10   Highest education level: Not on file  Occupational History   Not on file  Tobacco Use   Smoking status: Former    Packs/day: 0.50    Years: 20.00    Pack years: 10.00    Types: Cigarettes    Start date: 03/18/1990   Smokeless tobacco: Never   Tobacco comments:    Vapes currently   Vaping Use   Vaping Use: Former  Substance and Sexual Activity   Alcohol use: Not Currently    Alcohol/week: 0.0 standard drinks    Comment: maybe once a year   Drug use: No   Sexual activity: Yes    Birth control/protection: None    Comment: PCOS  Other Topics Concern   Not on file  Social History Narrative   Unemployed   Applied for disability   Leisure "take care of house and kids"   Walks when able   Social Determinants of Health   Financial Resource Strain: Not on file  Food Insecurity: Not on file  Transportation Needs: Not on file  Physical Activity: Not on file  Stress: Not on file  Social Connections: Not on file  Intimate Partner Violence: Not on file   Family History  Problem Relation Age of Onset   Heart murmur Father    Diabetes Father    Hypertension Father    Hyperlipidemia Father    Cancer Father        throat   Diabetes Mother    Cancer Mother         breast.uterine, ovarian   Asthma Son    Appendicitis Son    Hernia Son    Mental illness Son        Bipolar, personality Katelyn/o   Stroke Maternal Grandmother    Cancer Maternal Grandmother        breast   Cancer Paternal Grandfather        lung   Colon cancer Neg Hx     Objective: Office vital signs reviewed. BP 124/81    Pulse (!) 102    Temp 97.8 F (36.6 C) (Temporal)    Ht 5' 10" (1.778 m)    Wt (!) 386 lb (175.1 kg)    SpO2 97%    BMI 55.39 kg/m   Physical Examination:  General: Awake, alert, morbidly obese.  Smells of tobacco, No acute distress HEENT: Sclera white. Cardio: regular rate and rhythm, S1S2 heard, no murmurs appreciated Pulm: Inspiratory and expiratory wheezes noted in the right lung fields that does not resolve with coughing.  She had multiple coughing spells during our appointment today MSK: Ambulating independently  Assessment/ Plan: 41 y.o. female   Type 2 diabetes mellitus with other specified complication, without long-term current use of insulin ([place]) - Plan: CMP14+EGFR, Bayer DCA Hb A1c Waived, Lipid Panel  Hypertension associated with diabetes ([place]) - Plan: CMP14+EGFR, Lipid Panel  Hyperlipidemia associated with type 2 diabetes mellitus ([place]) - Plan: CMP14+EGFR, Lipid Panel  Coronary artery disease involving native coronary artery of native heart without angina pectoris  Pain syndrome, chronic - Plan: Ambulatory referral to Pain Clinic  Aspiration pneumonitis ([place]) - Plan: amoxicillin-clavulanate (AUGMENTIN) 875-125 MG tablet  Shortness of breath - Plan: albuterol (VENTOLIN HFA) 108 (90 Base) MCG/ACT inhaler   Type 2 diabetes now controlled with A1c down to 6.9.  Continue current regimen.  Hypertension controlled.  No changes  Check lipid panel, CMP.  She is currently being treated with Repatha, Vascepa and Crestor  Referral to pain clinic sent for pain syndrome that is not controlled with Lyrica and tramadol.  She seems to be more of a  presentation of fibromyalgia at this point.  She certainly has a risk for degenerative changes in her spine and hips given morbid obesity however.  We will see what additional interventions they might be able to offer  I am going to empirically treat her for aspiration pneumonitis/pneumonia with Augmentin.  Her right lower lung was remarkable for some inspiratory and expiratory wheezes with the remainder of her exam being unremarkable.  Albuterol inhaler renewed  No orders of the defined types were placed in this encounter.  No orders of the defined types were placed in this encounter.    Janora Norlander, DO [place] [phone]

## 2021-12-05 LAB — CMP14+EGFR
ALT: 39 IU/L — ABNORMAL HIGH (ref 0–32)
AST: 36 IU/L (ref 0–40)
Albumin/Globulin Ratio: 1.3 (ref 1.2–2.2)
Albumin: 4 g/dL (ref 3.8–4.8)
Alkaline Phosphatase: 90 IU/L (ref 44–121)
BUN/Creatinine Ratio: 16 (ref 9–23)
BUN: 11 mg/dL (ref 6–24)
Bilirubin Total: 0.4 mg/dL (ref 0.0–1.2)
CO2: 19 mmol/L — ABNORMAL LOW (ref 20–29)
Calcium: 9.1 mg/dL (ref 8.7–10.2)
Chloride: 100 mmol/L (ref 96–106)
Creatinine, Ser: 0.67 mg/dL (ref 0.57–1.00)
Globulin, Total: 3.2 g/dL (ref 1.5–4.5)
Glucose: 192 mg/dL — ABNORMAL HIGH (ref 70–99)
Potassium: 4.1 mmol/L (ref 3.5–5.2)
Sodium: 136 mmol/L (ref 134–144)
Total Protein: 7.2 g/dL (ref 6.0–8.5)
eGFR: 112 mL/min/{1.73_m2} (ref >59)

## 2021-12-05 LAB — LIPID PANEL
Chol/HDL Ratio: 7.6 ratio — ABNORMAL HIGH (ref 0.0–4.4)
Cholesterol, Total: 204 mg/dL — ABNORMAL HIGH (ref 100–199)
HDL: 27 mg/dL — ABNORMAL LOW (ref >39)
LDL Chol Calc (NIH): 111 mg/dL — ABNORMAL HIGH (ref 0–99)
Triglycerides: 380 mg/dL — ABNORMAL HIGH (ref 0–149)
VLDL Cholesterol Cal: 66 mg/dL — ABNORMAL HIGH (ref 5–40)

## 2021-12-09 ENCOUNTER — Telehealth: Payer: Self-pay | Admitting: *Deleted

## 2021-12-09 DIAGNOSIS — G894 Chronic pain syndrome: Secondary | ICD-10-CM

## 2021-12-09 NOTE — Telephone Encounter (Signed)
[name] - PA Case ID: [id number] - Rx #: [id number] ?traMADol HCl 50MG  tablets ? ?Send to plan ?

## 2021-12-14 NOTE — Telephone Encounter (Signed)
Outcome ?Denied on March 15 ?PA Case: [id number], Status: Denied. Notification: Completed. ?

## 2021-12-15 ENCOUNTER — Encounter: Payer: Self-pay | Admitting: Physical Medicine and Rehabilitation

## 2021-12-15 NOTE — Telephone Encounter (Signed)
Spoke with pt - aware - has been getting without insurance  ?

## 2021-12-15 NOTE — Telephone Encounter (Signed)
Left message to call back  

## 2021-12-15 NOTE — Telephone Encounter (Signed)
Please inform pt

## 2021-12-15 NOTE — Telephone Encounter (Signed)
Patient returning call.

## 2021-12-28 ENCOUNTER — Other Ambulatory Visit: Payer: Self-pay | Admitting: Family Medicine

## 2021-12-28 ENCOUNTER — Other Ambulatory Visit: Payer: Self-pay | Admitting: Cardiology

## 2021-12-28 ENCOUNTER — Other Ambulatory Visit: Payer: Self-pay | Admitting: Internal Medicine

## 2021-12-28 DIAGNOSIS — E1169 Type 2 diabetes mellitus with other specified complication: Secondary | ICD-10-CM

## 2021-12-28 DIAGNOSIS — F411 Generalized anxiety disorder: Secondary | ICD-10-CM

## 2021-12-28 DIAGNOSIS — F418 Other specified anxiety disorders: Secondary | ICD-10-CM

## 2021-12-29 DIAGNOSIS — I2510 Atherosclerotic heart disease of native coronary artery without angina pectoris: Secondary | ICD-10-CM | POA: Diagnosis not present

## 2021-12-29 DIAGNOSIS — I110 Hypertensive heart disease with heart failure: Secondary | ICD-10-CM | POA: Diagnosis not present

## 2021-12-29 DIAGNOSIS — Z882 Allergy status to sulfonamides status: Secondary | ICD-10-CM | POA: Diagnosis not present

## 2021-12-29 DIAGNOSIS — N939 Abnormal uterine and vaginal bleeding, unspecified: Secondary | ICD-10-CM | POA: Diagnosis not present

## 2021-12-29 DIAGNOSIS — I509 Heart failure, unspecified: Secondary | ICD-10-CM | POA: Diagnosis not present

## 2021-12-29 DIAGNOSIS — E7800 Pure hypercholesterolemia, unspecified: Secondary | ICD-10-CM | POA: Diagnosis not present

## 2021-12-29 DIAGNOSIS — E119 Type 2 diabetes mellitus without complications: Secondary | ICD-10-CM | POA: Diagnosis not present

## 2022-01-06 ENCOUNTER — Encounter: Payer: Self-pay | Admitting: Podiatry

## 2022-01-10 ENCOUNTER — Other Ambulatory Visit: Payer: Self-pay | Admitting: Family Medicine

## 2022-01-11 ENCOUNTER — Ambulatory Visit: Payer: Medicaid Other | Admitting: Podiatry

## 2022-01-19 ENCOUNTER — Other Ambulatory Visit: Payer: Self-pay | Admitting: Cardiology

## 2022-01-31 IMAGING — MG MM DIGITAL SCREENING BILAT W/ TOMO AND CAD
6 of 10 series · 6 of 30 positions shown · non-contrast
Comparison: None.

ACR Breast Density Category a: The breast tissue is almost entirely
fatty.

CLINICAL DATA: Screening.

EXAM:
DIGITAL SCREENING BILATERAL MAMMOGRAM WITH TOMOSYNTHESIS AND CAD
TECHNIQUE: Bilateral screening digital craniocaudal and mediolateral oblique
mammograms were obtained. Bilateral screening digital breast
tomosynthesis was performed. The images were evaluated with
computer-aided detection.

[R CC synth-2D]
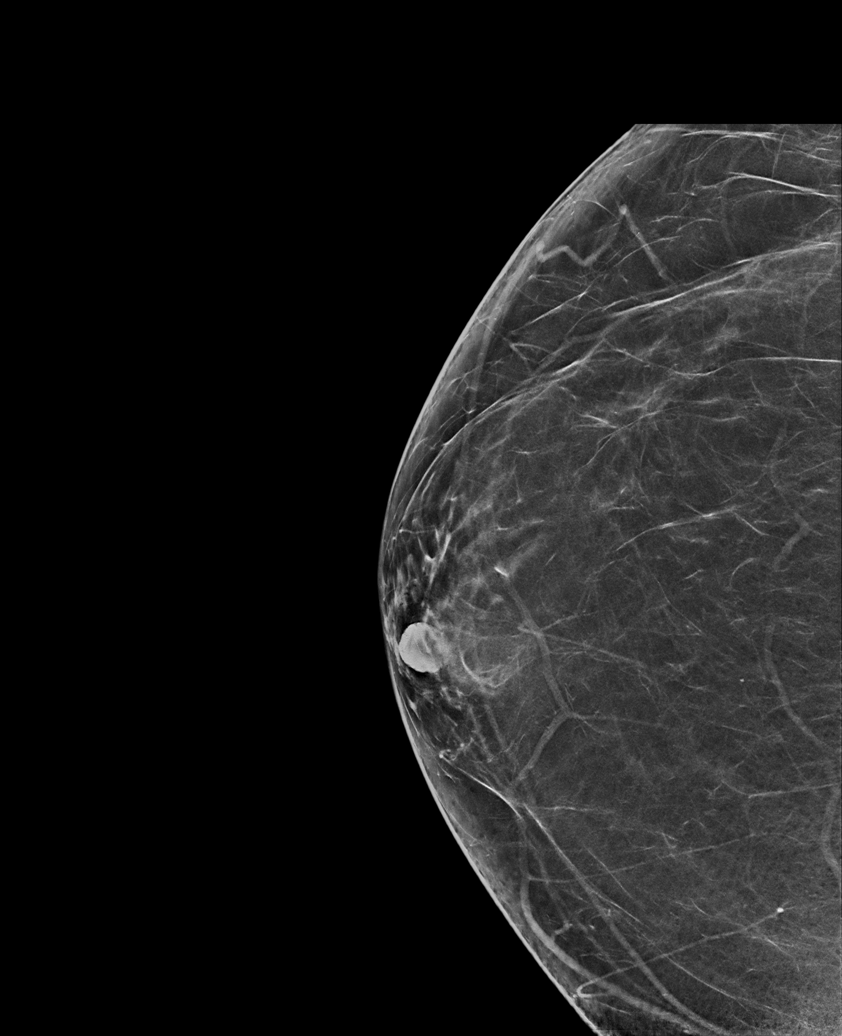

[L CC synth-2D]
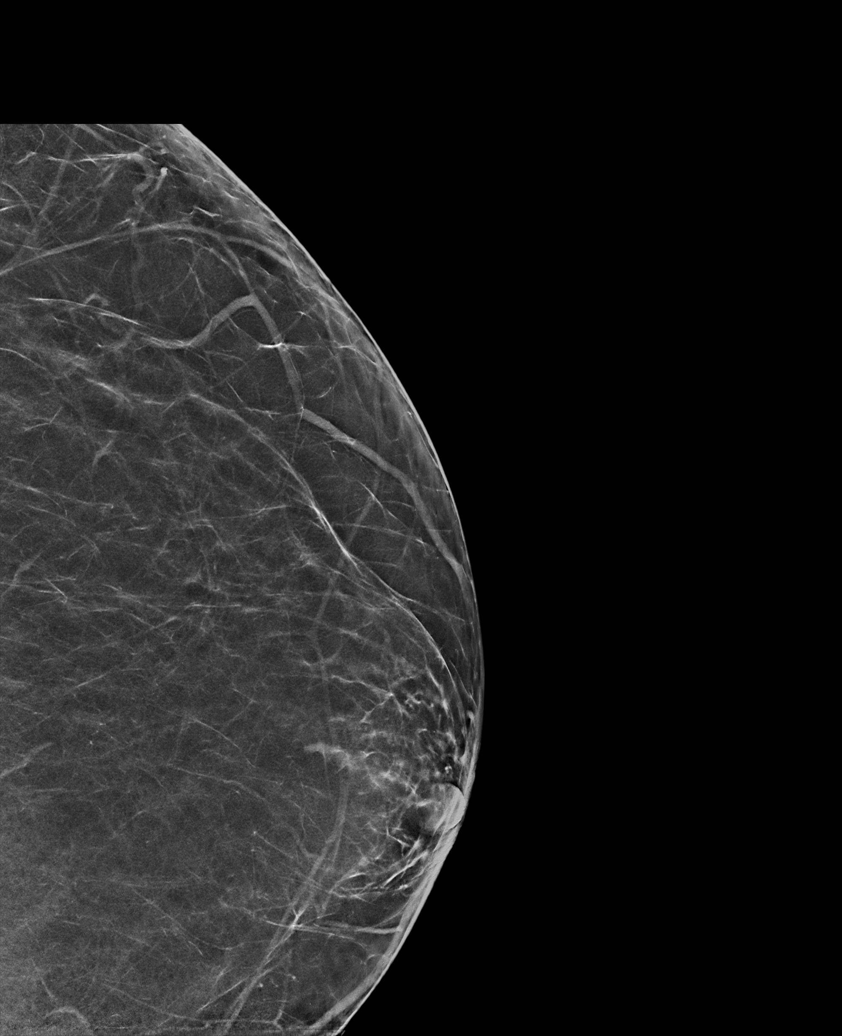

[R MLO synth-2D]
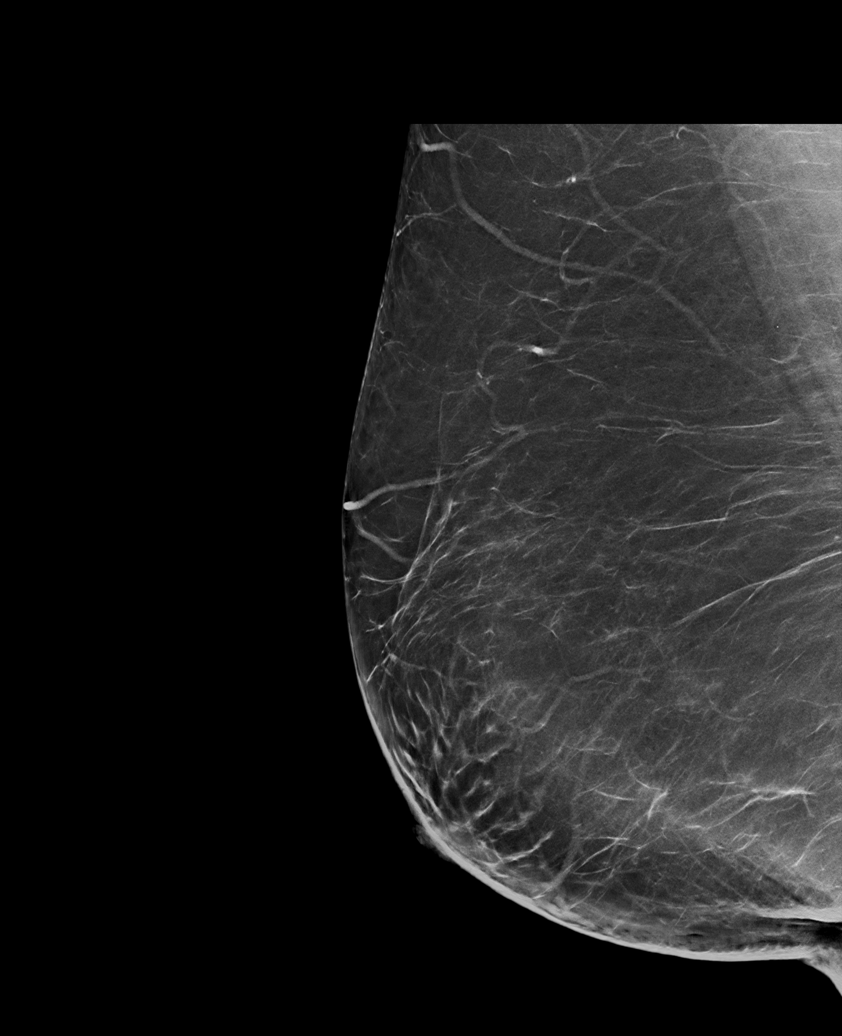

[L MLO synth-2D]
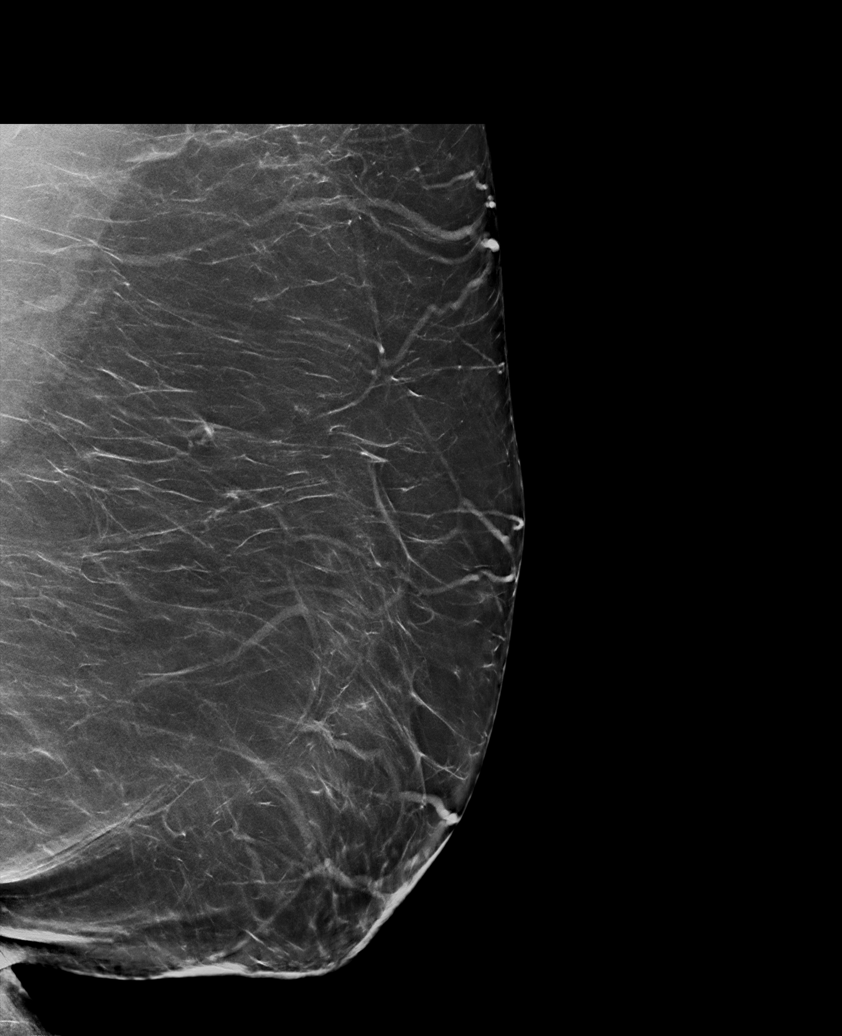

[R CV synth-2D]
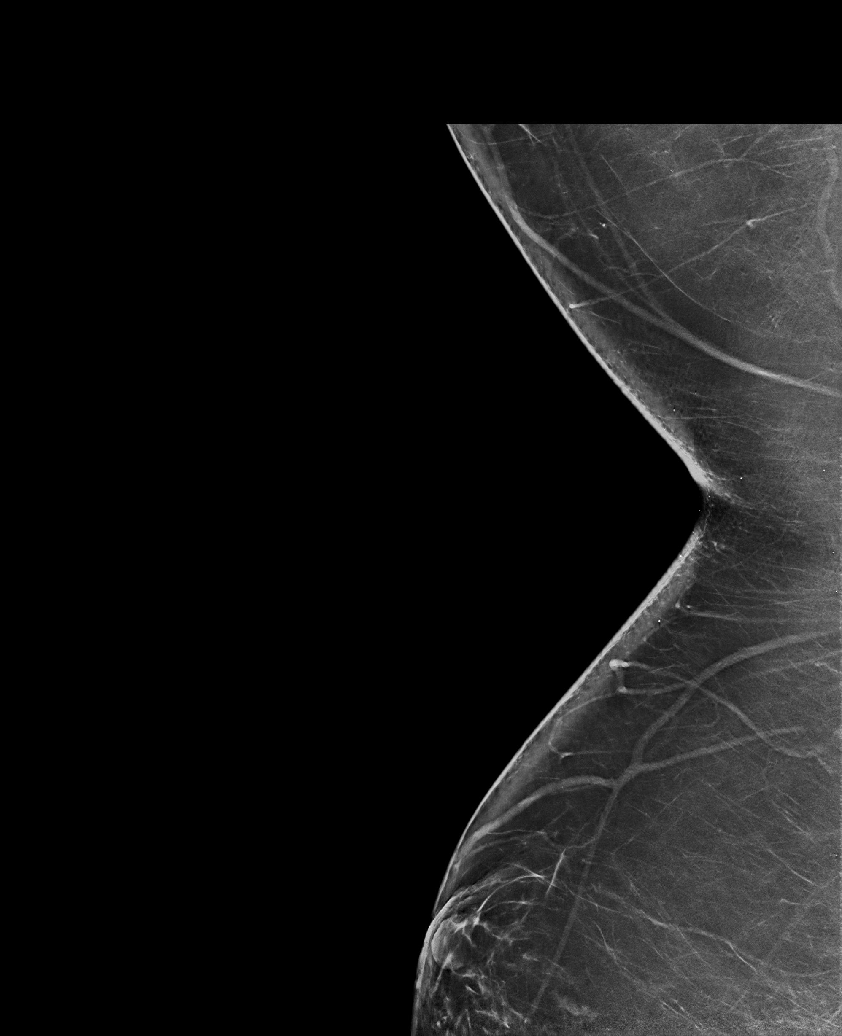

[R MLO tomo · tomo slice 47/92.0]
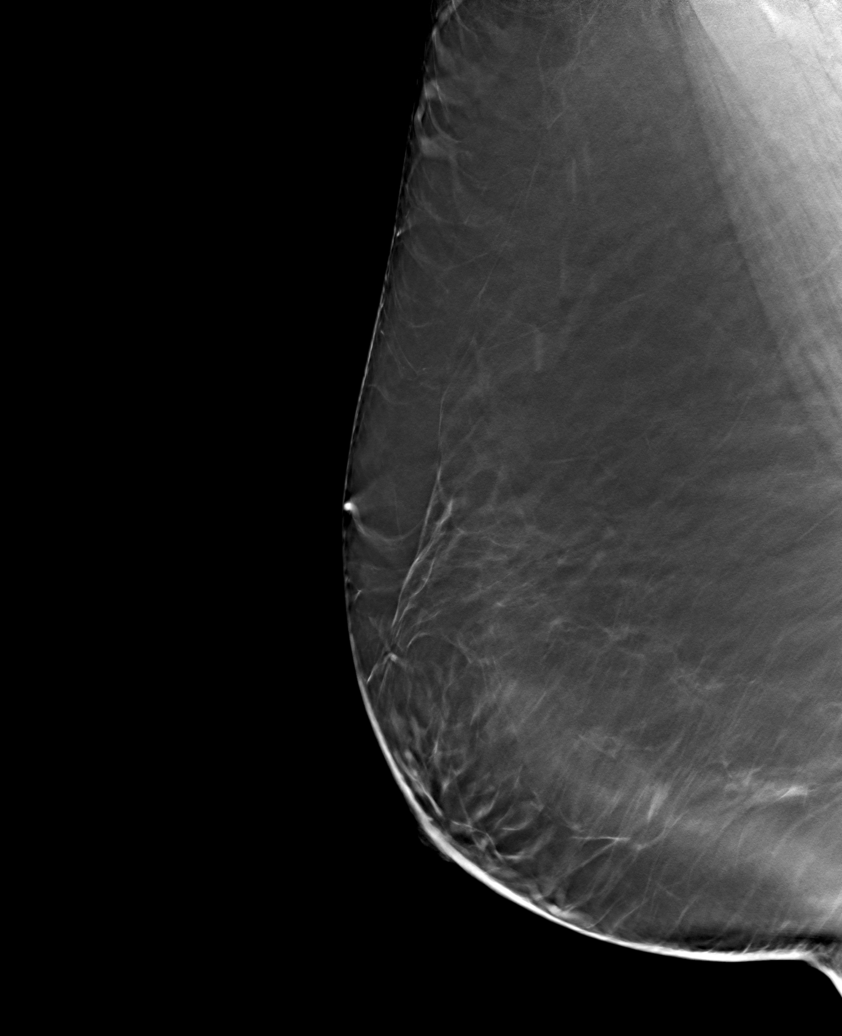

[6 of 30 positions shown; findings below may reference images not displayed]

FINDINGS: There are no findings suspicious for malignancy.
IMPRESSION: No mammographic evidence of malignancy. A result letter of this
screening mammogram will be mailed directly to the patient.

RECOMMENDATION:
Screening mammogram in one year. (Code:[id number]-[id number])

BI-RADS CATEGORY  1: Negative.

## 2022-02-01 ENCOUNTER — Other Ambulatory Visit: Payer: Self-pay | Admitting: Family Medicine

## 2022-02-01 DIAGNOSIS — E7800 Pure hypercholesterolemia, unspecified: Secondary | ICD-10-CM

## 2022-02-01 DIAGNOSIS — I214 Non-ST elevation (NSTEMI) myocardial infarction: Secondary | ICD-10-CM

## 2022-02-03 ENCOUNTER — Other Ambulatory Visit: Payer: Self-pay | Admitting: Cardiology

## 2022-02-03 ENCOUNTER — Other Ambulatory Visit: Payer: Self-pay | Admitting: Family Medicine

## 2022-02-03 DIAGNOSIS — E785 Hyperlipidemia, unspecified: Secondary | ICD-10-CM

## 2022-02-12 ENCOUNTER — Ambulatory Visit: Payer: Medicaid Other | Admitting: Internal Medicine

## 2022-02-17 ENCOUNTER — Encounter
Payer: Medicaid Other | Attending: Physical Medicine and Rehabilitation | Admitting: Physical Medicine and Rehabilitation

## 2022-02-17 ENCOUNTER — Encounter: Payer: Self-pay | Admitting: Physical Medicine and Rehabilitation

## 2022-02-17 VITALS — BP 149/90 | HR 90 | Ht 70.0 in | Wt 391.0 lb

## 2022-02-17 DIAGNOSIS — M797 Fibromyalgia: Secondary | ICD-10-CM | POA: Insufficient documentation

## 2022-02-17 DIAGNOSIS — M7918 Myalgia, other site: Secondary | ICD-10-CM | POA: Insufficient documentation

## 2022-02-17 DIAGNOSIS — G8929 Other chronic pain: Secondary | ICD-10-CM | POA: Insufficient documentation

## 2022-02-17 DIAGNOSIS — Z79891 Long term (current) use of opiate analgesic: Secondary | ICD-10-CM | POA: Insufficient documentation

## 2022-02-17 DIAGNOSIS — M5442 Lumbago with sciatica, left side: Secondary | ICD-10-CM | POA: Diagnosis not present

## 2022-02-17 DIAGNOSIS — Z5181 Encounter for therapeutic drug level monitoring: Secondary | ICD-10-CM | POA: Diagnosis not present

## 2022-02-17 DIAGNOSIS — M5441 Lumbago with sciatica, right side: Secondary | ICD-10-CM | POA: Insufficient documentation

## 2022-02-17 DIAGNOSIS — G894 Chronic pain syndrome: Secondary | ICD-10-CM | POA: Insufficient documentation

## 2022-02-17 DIAGNOSIS — Z79899 Other long term (current) drug therapy: Secondary | ICD-10-CM | POA: Insufficient documentation

## 2022-02-17 MED ORDER — TRAMADOL HCL 50 MG PO TABS: 100.0000 mg | ORAL_TABLET | Freq: Two times a day (BID) | ORAL | 5 refills | Status: DC

## 2022-02-17 MED ORDER — DULOXETINE HCL 30 MG PO CPEP: 30.0000 mg | ORAL_CAPSULE | Freq: Every day | ORAL | 5 refills | Status: DC
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
## 2022-02-17 NOTE — Patient Instructions (Addendum)
Pt is a 41 yr old female with BMI of 56- weight 390 lbs, DM, GAD, HTN,  and  CHF- chronic pain- Here for evaluation of pain.  Appears to have L T8 foraminal thoracic stenosis.  As well as fibromyalgia and myofascial pain syndrome.   Will reduce Prozac to 40 mg daily so have room for Cymbalta.    2.  Duloxetine /Cymbalta 30 mg nightly x 1 week  Then 60 mg nightly- for nerve pain  1% of patients can have nausea with Duloxetine- call me if needs an anti-nausea medicine. Can also cause mild dry mouth/dry eyes and mild constipation.   3. Will increase tramadol  to 100 mg 2x/day 120 tabs with 5 refills-    4. Thyroid - TSH is OK- so doesn't have hypothyroidism.   5. Opiate contract and Urine drug screen-   6. Con't Lyrica 150 mg 2x/day.  7.  Wait on PT- try to do recumbent bike daily- at least 15 minutes.    8. F/U in 3 months- call me in 1 month to see how doing. Trigger point injections at next visit.   9. Wait on magnesium since has loose stools. Wait on Muscle relaxant.

## 2022-02-21 LAB — TOXASSURE SELECT,+ANTIDEPR,UR

## 2022-02-24 ENCOUNTER — Telehealth: Payer: Self-pay | Admitting: *Deleted

## 2022-02-24 NOTE — Telephone Encounter (Signed)
Urine drug screen for this encounter is consistent for prescribed medication 

## 2022-03-01 ENCOUNTER — Ambulatory Visit (INDEPENDENT_AMBULATORY_CARE_PROVIDER_SITE_OTHER): Payer: Medicaid Other | Admitting: Cardiology

## 2022-03-01 ENCOUNTER — Encounter: Payer: Self-pay | Admitting: Cardiology

## 2022-03-01 VITALS — BP 118/78 | HR 60 | Ht 70.0 in | Wt 385.5 lb

## 2022-03-01 DIAGNOSIS — M79605 Pain in left leg: Secondary | ICD-10-CM | POA: Diagnosis not present

## 2022-03-01 DIAGNOSIS — I10 Essential (primary) hypertension: Secondary | ICD-10-CM

## 2022-03-01 DIAGNOSIS — I5032 Chronic diastolic (congestive) heart failure: Secondary | ICD-10-CM | POA: Diagnosis not present

## 2022-03-01 DIAGNOSIS — I25119 Atherosclerotic heart disease of native coronary artery with unspecified angina pectoris: Secondary | ICD-10-CM | POA: Diagnosis not present

## 2022-03-01 MED ORDER — ICOSAPENT ETHYL 1 G PO CAPS: 2.0000 g | ORAL_CAPSULE | Freq: Two times a day (BID) | ORAL | Status: DC

## 2022-03-01 NOTE — Patient Instructions (Addendum)
Medication Instructions:  Continue all current medications.  Labwork: none  Testing/Procedures: Your physician has requested that you have a lower extremity venous doppler.  During this test, ultrasound is used to evaluate venous blood flow in the legs.  Office will contact with results via phone or letter.      Follow-Up: 6 months   Any Other Special Instructions Will Be Listed Below (If Applicable).   If you need a refill on your cardiac medications before your next appointment, please call your pharmacy.

## 2022-03-01 NOTE — Addendum Note (Signed)
Addended by: Lesle Chris on: 03/01/2022 02:47 PM   Modules accepted: Orders

## 2022-03-01 NOTE — Progress Notes (Signed)
Clinical Summary Ms. Weatherspoon is a 41 y.o.female seen today for follow up of the following medical problems.      1. CAD - prior anterior MI 09/2011, 95% lesion in mid LAD and ramus with 95% ostial stenosis. LVEF 55% by LV gram, apical hypokinesis. BMS to mid LAD and cutting balloon angioplasty of ramus.   - repeat cath 02/2012 with patent vessels and stent - 09/2011 Echo: LVEF 50% - 12/2013 Lexiscan large anteroseptal ischemia - cath 12/2013 with patent vessels  - cath 02/2016 [facility] in setting of NSTEMI showed LM patent, LAD patent with patent stent, distal LAD 30-40%, LCX patent, RCA 30-40%. LVEDP 40, PCWP 30 mean PA 32, CI 4.93 - echo 02/2016 [place] LVEF 50%, apical akinesis       - 07/2018 cath mild to moderate disease other than small D1 99% too small for PCI. Normal LVEDP - we tried imdur 28m daily. Reported some episodes of shaking on this medication. Changed to ranexa     12/2019 admitted with NSTEMI - cath with LM patent, ostial LAD 40%, D1 99%, ramus ostial 90%, LCX patent, RCA prox 50%. Received DES to ramus        - slight chest pains at times  overall not bothersome. Has not had to use NG - compliant with meds      2. Chronic diastolic HF - recent left leg swelling about 1 month, some pain with walking.  - weight down 6 lbs  - has tosremide, takes prn about 3 times a week.  - SOB is up and down, compliant with inhalers.     3. HTN -she is compliant with meds   4. Hyperlipidemia   12/2019 TC 107 TG 105 HDL 27 LDL 59 Jan 2022 TC 160 TG 157 HDL 35 LDL 94 - followed by lipid clinic, she is due for f/u    11/2021 TC 204 TG 380 HDL 27 LDL 111. Had missed some repatha doses around that time.  - she is on crestor 465 repatha, zetia 139mdaily - followed by lipid clinic   Past Medical History:  Diagnosis Date   Allergy    Anemia    Anginal pain (HC[place]ansberry [place]   Anxiety    CHF (congestive heart failure) (HCPurcell   Chronic asthmatic bronchitis (HCGibsonia11/21/2022    Chronic kidney disease    history of kidney stones   Coronary artery disease 09/2011   a.s/p BMS to LAD and angioplasty of RI in 09/2011 b. patent stent by cath in 02/2012, 12/2013, and 02/2016 with most recent showing 30-40% dLAD and 30-40% RCA stenosis   Depression    Diabetes mellitus    Dyspnea    Hyperlipidemia    Hypertension    Midsternal chest pain 03/03/2012   Migraines    Morbid obesity (HCFort [place]   Myocardial infarct (HCBrook[place]01/2013   Myocardial infarction (HC[place]ards [place]06/2017   Neuromuscular disorder (HCRay[place]   Palpitations 02/17/2012   Ureteral stone with hydronephrosis    Urinary tract obstruction due to kidney stone 02/15/2017     Allergies  Allergen Reactions   Bee Venom Anaphylaxis and Swelling   Latex Itching   Misc. Sulfonamide Containing Compounds Nausea And Vomiting   Sulfa Drugs Cross Reactors Nausea And Vomiting     Current Outpatient Medications  Medication Sig Dispense Refill   Accu-Chek Softclix Lancets lancets BOOD SUGAR FOUR TIMES DAILY Dx E11.9 100 each 11   albuterol (VENTOLIN HFA) 108 (90 Base) MCG/ACT  inhaler Inhale 2 puffs into the lungs every 4 (four) hours as needed for wheezing or shortness of breath. 8 g 5   aspirin 81 MG chewable tablet Chew 81 mg by mouth daily.     bisoprolol (ZEBETA) 5 MG tablet One twice daily 60 tablet 11   blood glucose meter kit and supplies Dispense based on patient and insurance preference. Use up to four times daily as directed. (FOR ICD-10:  E11.22). Check BG daily 1 each 0   budesonide-formoterol (SYMBICORT) 160-4.5 MCG/ACT inhaler Take 2 puffs first thing in am and then another 2 puffs about 12 hours later. 1 each 11   busPIRone (BUSPAR) 10 MG tablet TAKE 1 TABLET BY MOUTH TWICE DAILY 60 tablet 1   clopidogrel (PLAVIX) 75 MG tablet TAKE 1 TABLET BY MOUTH EVERY DAY 90 tablet 3   DULoxetine (CYMBALTA) 30 MG capsule Take 1 capsule (30 mg total) by mouth at bedtime. Take 1 tab nightly x 1 week, then 2 capsules/60 mg nightly- for  nerve pain- take with reduced dose of Prozac 60 capsule 5   ezetimibe (ZETIA) 10 MG tablet Take 10 mg by mouth daily.     fenofibrate (TRICOR) 48 MG tablet TAKE 1 TABLET BY MOUTH DAILY 30 tablet 1   FLUoxetine (PROZAC) 20 MG capsule Take 3 capsules (60 mg total) by mouth daily. 270 capsule 3   glucose blood (ACCU-CHEK AVIVA PLUS) test strip Test BS 4 times daily Dx E11.9 400 strip 3   losartan (COZAAR) 25 MG tablet Take 1 tablet by mouth daily.     medroxyPROGESTERone (PROVERA) 5 MG tablet TAKE 1 TABLET BY MOUTH DAILY 90 tablet 2   metFORMIN (GLUCOPHAGE) 1000 MG tablet Take 1 tablet (1,000 mg total) by mouth 2 (two) times daily. 180 tablet 3   nitroGLYCERIN (NITROSTAT) 0.4 MG SL tablet DISSOLVE 1 TABLET UNDER THE TONGUE EVERY 5 MINUTES AS NEEDED FOR CHEST PAIN. DO NOT EXCEED A TOTAL OF 3 DOSES IN 15 MINUTES. 25 tablet 3   pantoprazole (PROTONIX) 40 MG tablet TAKE 1 TABLET BY MOUTH DAILY 30 tablet 1   potassium chloride SA (KLOR-CON M) 20 MEQ tablet Take 1 tablet (20 mEq total) by mouth daily as needed (when taking Torsemide). 90 tablet 1   pregabalin (LYRICA) 150 MG capsule Take 1 capsule (150 mg total) by mouth 2 (two) times daily. 60 capsule 3   ranolazine (RANEXA) 1000 MG SR tablet TAKE 1 TABLET BY MOUTH TWICE DAILY 180 tablet 3   REPATHA SURECLICK 034 MG/ML SOAJ INJECT 140 MG INTO THE SKIN EVERY 14 DAYS 2 mL 6   rosuvastatin (CRESTOR) 40 MG tablet TAKE 1 TABLET BY MOUTH DAILY 30 tablet 6   Semaglutide,0.25 or 0.5MG/DOS, (OZEMPIC, 0.25 OR 0.5 MG/DOSE,) 2 MG/1.5ML SOPN Inject 0.5 mg into the skin once a week. STOP VICTOZA 4.5 mL 1   torsemide (DEMADEX) 20 MG tablet TAKE 2 TABLETS BY MOUTH DAILY AS NEEDED FOR swelling 180 tablet 3   traMADol (ULTRAM) 50 MG tablet Take 2 tablets (100 mg total) by mouth 2 (two) times daily. 120 tablet 5   No current facility-administered medications for this visit.     Past Surgical History:  Procedure Laterality Date   BIOPSY  10/27/2020   Procedure:  BIOPSY;  Surgeon: Daneil Dolin, MD;  [facility];  Service: Endoscopy;;   CARDIAC CATHETERIZATION  10/08/2011   LAD: 95% mid, Ramus: 95% ostial   CARPAL TUNNEL RELEASE     CESAREAN SECTION  CHOLECYSTECTOMY     CORONARY ANGIOPLASTY WITH STENT PLACEMENT  10/08/2011   LAD: BMS, Ramus: cutting balloon angioplasty   CORONARY STENT INTERVENTION N/A 01/14/2020   Procedure: CORONARY STENT INTERVENTION;  Surgeon: Leonie Man, MD;  Location: [place] CV LAB;  Service: Cardiovascular;  Laterality: N/A;   CYSTOSCOPY WITH RETROGRADE PYELOGRAM, URETEROSCOPY AND STENT PLACEMENT Left 02/16/2017   Procedure: CYSTOSCOPY WITH RETROGRADE PYELOGRAM, URETEROSCOPY AND STENT PLACEMENT;  Surgeon: Kathie Rhodes, MD;  Location: [facility];  Service: Urology;  Laterality: Left;   CYSTOSCOPY WITH RETROGRADE PYELOGRAM, URETEROSCOPY AND STENT PLACEMENT Left 03/28/2017   Procedure: LEFT STENT REMOVAL LEFT RETROGRADE PYELOGRAM LEFT URETEROSCOPY   LASER LITHOTRIPSY  AND  STENT REPLACEMENT;  Surgeon: Cleon Gustin, MD;  Location: [facility];  Service: Urology;  Laterality: Left;   ESOPHAGOGASTRODUODENOSCOPY (EGD) WITH PROPOFOL N/A 10/27/2020   Rourk: mild erosive reflux esophagitis, h.pylori gastritis   LEFT HEART CATH AND CORONARY ANGIOGRAPHY N/A 08/15/2018   Procedure: LEFT HEART CATH AND CORONARY ANGIOGRAPHY;  Surgeon: Martinique, Peter M, MD;  Location: [place] CV LAB;  Service: Cardiovascular;  Laterality: N/A;   LEFT HEART CATH AND CORONARY ANGIOGRAPHY N/A 01/14/2020   Procedure: LEFT HEART CATH AND CORONARY ANGIOGRAPHY;  Surgeon: Leonie Man, MD;  Location: [place] CV LAB;  Service: Cardiovascular;  Laterality: N/A;   LEFT HEART CATHETERIZATION WITH CORONARY ANGIOGRAM N/A 10/08/2011   Procedure: LEFT HEART CATHETERIZATION WITH CORONARY ANGIOGRAM;  Surgeon: Peter M Martinique, MD;  Location: [facility] CATH LAB;  Service: Cardiovascular;  Laterality: N/A;   LEFT HEART CATHETERIZATION WITH CORONARY  ANGIOGRAM N/A 03/02/2012   Procedure: LEFT HEART CATHETERIZATION WITH CORONARY ANGIOGRAM;  Surgeon: Burnell Blanks, MD;  Location: [facility] CATH LAB;  Service: Cardiovascular;  Laterality: N/A;   LEFT HEART CATHETERIZATION WITH CORONARY ANGIOGRAM N/A 01/04/2014   Procedure: LEFT HEART CATHETERIZATION WITH CORONARY ANGIOGRAM;  Surgeon: Troy Sine, MD;  Location: [facility] CATH LAB;  Service: Cardiovascular;  Laterality: N/A;   right hand pinky surgery       Allergies  Allergen Reactions   Bee Venom Anaphylaxis and Swelling   Latex Itching   Misc. Sulfonamide Containing Compounds Nausea And Vomiting   Sulfa Drugs Cross Reactors Nausea And Vomiting      Family History  Problem Relation Age of Onset   Heart murmur Father    Diabetes Father    Hypertension Father    Hyperlipidemia Father    Cancer Father        throat   Diabetes Mother    Cancer Mother        breast.uterine, ovarian   Asthma Son    Appendicitis Son    Hernia Son    Mental illness Son        Bipolar, personality d/o   Stroke Maternal Grandmother    Cancer Maternal Grandmother        breast   Cancer Paternal Grandfather        lung   Colon cancer Neg Hx      Social History Ms. Osentoski reports that she has been smoking cigarettes. She started smoking about 31 years ago. She has a 10.00 pack-year smoking history. She has never used smokeless tobacco. Ms. Epple reports that she does not currently use alcohol.   Review of Systems CONSTITUTIONAL: No weight loss, fever, chills, weakness or fatigue.  HEENT: Eyes: No visual loss, blurred vision, double vision or yellow sclerae.No hearing loss, sneezing, congestion, runny nose or sore throat.  SKIN: No rash  or itching.  CARDIOVASCULAR: per hpi RESPIRATORY: No shortness of breath, cough or sputum.  GASTROINTESTINAL: No anorexia, nausea, vomiting or diarrhea. No abdominal pain or blood.  GENITOURINARY: No burning on urination, no polyuria NEUROLOGICAL: No headache,  dizziness, syncope, paralysis, ataxia, numbness or tingling in the extremities. No change in bowel or bladder control.  MUSCULOSKELETAL: No muscle, back pain, joint pain or stiffness.  LYMPHATICS: No enlarged nodes. No history of splenectomy.  PSYCHIATRIC: No history of depression or anxiety.  ENDOCRINOLOGIC: No reports of sweating, cold or heat intolerance. No polyuria or polydipsia.  Marland Kitchen   Physical Examination Today's Vitals   03/01/22 1125  BP: 118/78  Pulse: 60  SpO2: 92%  Weight: (!) 385 lb 8 oz (174.9 kg)  Height: 5' 10"  (1.778 m)   Body mass index is 55.31 kg/m.  Gen: resting comfortably, no acute distress HEENT: no scleral icterus, pupils equal round and reactive, no palptable cervical adenopathy,  CV: RRR, no m/r/g no jvd Resp: Clear to auscultation bilaterally GI: abdomen is soft, non-tender, non-distended, normal bowel sounds, no hepatosplenomegaly MSK: extremities are warm, no edema.  Skin: warm, no rash Neuro:  no focal deficits Psych: appropriate affect   Diagnostic Studies  02/2012 Cath Hemodynamic Findings:   Central aortic pressure: 108/70   Left ventricular pressure: 123/10/21   Angiographic Findings:   Left main: No obstructive disease noted.   Left Anterior Descending Artery: Large caliber vessel that courses to the apex. There is a stent present in the mid vessel that is widely patent with no restenosis. The remainder of the LAD is disease free. There is a moderate sized diagonal Deronte Solis with 30% proximal stenosis.   Circumflex Artery: Dominant, large caliber vessel with no disease throughout the AV groove segment. There is small to moderate sized intermediate Tyree Fluharty that has ostial 30% stenosis. The remainder of the Circumflex has no obstructive disease.   Right Coronary Artery: Small, non-dominant vessel. No disease noted.   Left Ventricular Angiogram: LVEF 50-55%.   Impression:   1. Double vessel CAD with patent stent mid LAD and patent angioplasty site  at ostium of intermediate Eleanore Junio.   2. Normal LV systolic function     05/3733 Cath PROCEDURAL FINDINGS   Hemodynamics:   AO 135/92 with a mean of 114 mmHg   LV 136/25 mmHg   Coronary angiography:   Coronary dominance: Left   Left mainstem: Normal.   Left anterior descending (LAD): There is a 95% stenosis in the mid LAD immediately after the takeoff of the first diagonal. The first diagonal Huber Mathers is moderate in size and appears normal. The LAD stenosis does appear to be hazy.   There is a moderately large ramus intermediate Jyla Hopf which has a 95% ostial stenosis.   Left circumflex (LCx): This is a dominant vessel and appears normal throughout.   Right coronary artery (RCA): This is a small nondominant vessel and is normal.   Left ventriculography: Left ventricular systolic function is abnormal with apical hypokinesis. LVEF is estimated at 55%, there is no significant mitral regurgitation   PCI Note: Following the diagnostic procedure, the decision was made to proceed with PCI of the LAD. Weight-based bivalirudin was given for anticoagulation. Once a therapeutic ACT was achieved, a 5 [place] EBU guide catheter was inserted. A pro-water coronary guidewire was used to cross the lesion. The lesion was predilated with a 2.5 mm balloon. The lesion was then stented with a 3.0 x 18 mm vision stent. The stent was postdilated with a  3.25 mm noncompliant balloon. Following PCI, there was 0% residual stenosis and TIMI-3 flow. At this point the patient's pain was improved but was [place] of moderate intensity. We then proceeded to intervene on the ostial ramus intermediate lesion. This was crossed with the prowater wire. This was dilated using a 3.0 x 10 mm cutting balloon performing 3 inflations to 6 atmospheres. This showed a good angiographic result with less than 30% residual stenosis and TIMI grade 3 flow. Final angiography confirmed an excellent result. The patient tolerated the procedure well. There were no  immediate procedural complications. A TR band was used for radial hemostasis. The patient was transferred to the post catheterization recovery area for further monitoring.   PCI Data:   Vessel - LAD/Segment - mid vessel immediately after the takeoff of the first diagonal.   Percent Stenosis (pre) 95%   TIMI-flow 3.   Stent 3.0 x 18 mm vision stent.   Percent Stenosis (post) 0%   TIMI-flow (post) 3.   Second vessel: Ostial ramus intermediate Sarp Vernier   Percent stenosis: 95%.   TIMI flow 3.   3.0 mm cutting balloon   Percent stenosis (post) less than 30%   TIMI flow 3   Final Conclusions:   1. Severe 2 vessel obstructive coronary disease.   2. Overall well preserved LV systolic function with apical wall motion abnormality and ejection fraction of 55%.   3. Successful intracoronary stenting of the mid LAD with a bare-metal stent.   4. Successful cutting balloon angioplasty of the ramus intermediate Taggart Prasad.   Recommendations:   Continue therapy with Plavix. Patient reports a history of allergy to aspirin. Aggressive risk factor modification.   Disposition: Patient be observed in the intensive care unit tonight. If she has no complications she should be able to be transferred to telemetry in the morning.     12/2013 Cath HEMODYNAMICS:    Central Aorta: 100/60    Left Ventricle: 100/3   ANGIOGRAPHY:   1. Left main: Normal and trifurcated into an LAD, ramus intermediate vessel, and a dominant left circumflex coronary artery   2. LAD: Leandrew Koyanagi gave rise to a proximal large first diagonal vessel.  The LAD just after the diagonal vessel had a widely patent stent.  The remainder of the LAD was free of significant disease and extended to the LV apex. 3. Ramus Intermediate: No evidence for restenosis at previous site of ostial cutting balloon intervention. 4. Left circumflex: Angiographically normal.  Dominant vessel, which gave rise to 2 marginal branches and in the PDA.Marland Kitchen   4. Right coronary  artery: Angiographically normal, nondominant vessel   Left ventriculography revealed global LV function.  There was a suggestion of mild residual distal anterolateral hypocontractility.  There was catheter-induced mitral regurgitation.   IMPRESSION:   Normal LV function with mild distal anterolateral, residual hypocontractility   No significant residual coronary obstructive disease with evidence for widely patent LAD stent, no evidence for restenosis of the ramus intermediate vessel, normal dominant left circumflex coronary artery and normal nondominant right coronary artery.       12/2013 MPI IMPRESSION: 1.  Abnormal Lexiscan for ischemia   2.  Large anteroseptal and apical area of ischemia   3. High risk study for major cardiac events based on area of myocardium at [facility]   4. Normal left ventricular systolic function, left ventricular ejection fraction 52%     12/2019 cath A drug-eluting stent was successfully placed using a STENT RESOLUTE ONYX 2.5X30. Postdilated to 2.7 mm  Post intervention, there is a 0% residual stenosis throughout the entire segment.Marland Kitchen ------------ Colon Flattery LAD lesion is 40% stenosed. Prox LAD previously placed BMS stent is 30% stenosed. Prox LAD to Mid LAD lesion is 20% stenosed. Ost 1st Diag lesion is 99% stenosed. Known from prior catheterizations Small, nondominant RCA: Prox RCA to Dist RCA lesion is 50% stenosed. -------------- The left ventricular systolic function is normal. The left ventricular ejection fraction is 55-65% by visual estimate. LV end diastolic pressure is normal.   CULPRIT LESION: Ostial and proximal RAMUS INTERMEDIUS (RI) 95-70%  Successful DES PCI of RI (RESOLUTE ONYX DES 2.5 mm x 30 mm--2.7 mm) Otherwise moderate diffuse disease: Diffuse moderate LAD 20-30% stenosis and known 99% subtotal occlusion of the very small caliber 1st Diag Preserved LVEF and normal EDP.   Assessment and Plan   1. CAD with angina pectoris - due to disease  burden have elected for extended plavix use - mild chest pains at times perhaps due to residual small vessel diag disease - continue current meds.      2. Chronic diastolic HF - appears euvoleimic - continue prn tosremide     3. HTN At goal, continue current meds   4. Hyperlipidemia - most recent labs 11/2021 had missed some repatha doses - continue to monitor at this time.  5. Left leg swelling - - recent left leg asymmetric swelling, appears to be euvolemic. Will obtain LLE venous US to evaluate for possible DVT   Arnoldo Lenis, M.D.

## 2022-03-03 ENCOUNTER — Other Ambulatory Visit: Payer: Self-pay | Admitting: Cardiology

## 2022-03-03 DIAGNOSIS — M7989 Other specified soft tissue disorders: Secondary | ICD-10-CM

## 2022-03-04 ENCOUNTER — Other Ambulatory Visit: Payer: Self-pay | Admitting: Family Medicine

## 2022-03-04 DIAGNOSIS — F418 Other specified anxiety disorders: Secondary | ICD-10-CM

## 2022-03-04 DIAGNOSIS — F411 Generalized anxiety disorder: Secondary | ICD-10-CM

## 2022-03-04 DIAGNOSIS — E1169 Type 2 diabetes mellitus with other specified complication: Secondary | ICD-10-CM

## 2022-03-08 ENCOUNTER — Ambulatory Visit (INDEPENDENT_AMBULATORY_CARE_PROVIDER_SITE_OTHER): Payer: Medicaid Other

## 2022-03-08 ENCOUNTER — Ambulatory Visit: Payer: Medicaid Other | Admitting: Family Medicine

## 2022-03-08 DIAGNOSIS — M79662 Pain in left lower leg: Secondary | ICD-10-CM | POA: Diagnosis not present

## 2022-03-08 DIAGNOSIS — M7989 Other specified soft tissue disorders: Secondary | ICD-10-CM | POA: Diagnosis not present

## 2022-03-09 ENCOUNTER — Telehealth: Payer: Self-pay | Admitting: *Deleted

## 2022-03-09 DIAGNOSIS — I872 Venous insufficiency (chronic) (peripheral): Secondary | ICD-10-CM

## 2022-03-09 DIAGNOSIS — M7989 Other specified soft tissue disorders: Secondary | ICD-10-CM

## 2022-03-09 NOTE — Telephone Encounter (Signed)
-----   Message from Antoine Poche, MD sent at 03/09/2022  3:56 PM EDT ----- [place] is negative for blood clot. Likely swelling is asymmetrical due to some venous insufficiency in the left leg, meaning sometimes the veins in the legs can wear out over time and one side can be more prominent than the other. Would continue fluid pill, could prescribe compression stockings if interested  Dominga Ferry MD

## 2022-03-09 NOTE — Telephone Encounter (Signed)
Katelyn Chris, LPN  [id number]  6:02 PM EDT Back to Top    Notified husband [name]), copy to pcp.  They would like to go ahead and try the compression stockings.  New prescription sent to [facility] for low compression, below the knee stockings.

## 2022-03-16 ENCOUNTER — Encounter: Payer: Self-pay | Admitting: Family Medicine

## 2022-03-16 ENCOUNTER — Ambulatory Visit (INDEPENDENT_AMBULATORY_CARE_PROVIDER_SITE_OTHER): Payer: Medicaid Other | Admitting: Family Medicine

## 2022-03-16 VITALS — BP 130/80 | HR 86 | Temp 97.9°F | Ht 70.0 in | Wt 378.4 lb

## 2022-03-16 DIAGNOSIS — I2510 Atherosclerotic heart disease of native coronary artery without angina pectoris: Secondary | ICD-10-CM | POA: Diagnosis not present

## 2022-03-16 DIAGNOSIS — I152 Hypertension secondary to endocrine disorders: Secondary | ICD-10-CM | POA: Diagnosis not present

## 2022-03-16 DIAGNOSIS — E1169 Type 2 diabetes mellitus with other specified complication: Secondary | ICD-10-CM

## 2022-03-16 DIAGNOSIS — E1159 Type 2 diabetes mellitus with other circulatory complications: Secondary | ICD-10-CM

## 2022-03-16 DIAGNOSIS — F411 Generalized anxiety disorder: Secondary | ICD-10-CM | POA: Diagnosis not present

## 2022-03-16 DIAGNOSIS — E785 Hyperlipidemia, unspecified: Secondary | ICD-10-CM

## 2022-03-16 LAB — BAYER DCA HB A1C WAIVED: HB A1C (BAYER DCA - WAIVED): 7.3 % — ABNORMAL HIGH (ref 4.8–5.6)

## 2022-03-16 MED ORDER — FLUOXETINE HCL 10 MG PO CAPS: ORAL_CAPSULE | ORAL | 0 refills | Status: DC

## 2022-03-16 MED ORDER — SEMAGLUTIDE (1 MG/DOSE) 4 MG/3ML ~~LOC~~ SOPN: 1.0000 mg | PEN_INJECTOR | SUBCUTANEOUS | 3 refills | Status: DC

## 2022-03-16 NOTE — Progress Notes (Signed)
Subjective: CC:Dm PCP: Janora Norlander, DO MHD:[id number] D Cummings is a 41 y.o. female presenting to clinic today for:  1. Type 2 Diabetes with hypertension, hyperlipidemia:  She reports compliance with all medications.  She notes that stress continues to be high.  Last eye exam: Needs Last foot exam: needs Last A1c:  Lab Results  Component Value Date   HGBA1C 7.3 (H) 03/16/2022   Nephropathy screen indicated?: on ARB Last flu, zoster and/or pneumovax:  Immunization History  Administered Date(s) Administered   Influenza Split 10/10/2011   Influenza,inj,Quad PF,6+ Mos 10/28/2017, 06/26/2018, 06/05/2019, 08/19/2020, 06/18/2021   Influenza-Unspecified 06/26/2018, 06/05/2019   PFIZER(Purple Top)SARS-COV-2 Vaccination 03/27/2020   Pneumococcal Conjugate-13 10/28/2017   Pneumococcal Polysaccharide-23 10/10/2011   Tdap 01/25/2018    ROS: No chest pain or shortness of breath reported.  2.  Fibromyalgia/chronic pain Patient is under the care of of a specialist for this.  She was continued on her Lyrica and tramadol but Cymbalta was added.  She is currently up to 2 capsules daily.  Her Prozac dose was only reduced to 40 mg.  She does not report any serotonin like symptoms but notes that pain continues to be uncontrolled.  She has a follow-up visit with them soon  ROS: Per HPI  Allergies  Allergen Reactions   Bee Venom Anaphylaxis and Swelling   Latex Itching   Misc. Sulfonamide Containing Compounds Nausea And Vomiting   Sulfa Drugs Cross Reactors Nausea And Vomiting   Past Medical History:  Diagnosis Date   Allergy    Anemia    Anginal pain (HCC)    Anxiety    CHF (congestive heart failure) (HCC)    Chronic asthmatic bronchitis ([place]) 08/17/2021   Chronic kidney disease    history of kidney stones   Coronary artery disease 09/2011   a.s/p BMS to LAD and angioplasty of RI in 09/2011 b. patent stent by cath in 02/2012, 12/2013, and 02/2016 with most recent showing 30-40%  dLAD and 30-40% RCA stenosis   Depression    Diabetes mellitus    Dyspnea    Hyperlipidemia    Hypertension    Midsternal chest pain 03/03/2012   Migraines    Morbid obesity ([place])    Myocardial infarct ([place]) 09/2011   Myocardial infarction ([place]) 02/2016   Neuromuscular disorder ([place])    Palpitations 02/17/2012   Ureteral stone with hydronephrosis    Urinary tract obstruction due to kidney stone 02/15/2017    Current Outpatient Medications:    Accu-Chek Softclix Lancets lancets, BOOD SUGAR FOUR TIMES DAILY Dx E11.9, Disp: 100 each, Rfl: 11   albuterol (VENTOLIN HFA) 108 (90 Base) MCG/ACT inhaler, Inhale 2 puffs into the lungs every 4 (four) hours as needed for wheezing or shortness of breath., Disp: 8 g, Rfl: 5   aspirin 81 MG chewable tablet, Chew 81 mg by mouth daily., Disp: , Rfl:    bisoprolol (ZEBETA) 5 MG tablet, One twice daily, Disp: 60 tablet, Rfl: 11   blood glucose meter kit and supplies, Dispense based on patient and insurance preference. Use up to four times daily as directed. (FOR ICD-10:  E11.22). Check BG daily, Disp: 1 each, Rfl: 0   budesonide-formoterol (SYMBICORT) 160-4.5 MCG/ACT inhaler, Take 2 puffs first thing in am and then another 2 puffs about 12 hours later., Disp: 1 each, Rfl: 11   busPIRone (BUSPAR) 10 MG tablet, TAKE 1 TABLET BY MOUTH TWICE DAILY, Disp: 60 tablet, Rfl: 0   clopidogrel (PLAVIX) 75 MG tablet, TAKE  1 TABLET BY MOUTH EVERY DAY, Disp: 90 tablet, Rfl: 3   DULoxetine (CYMBALTA) 30 MG capsule, Take 1 capsule (30 mg total) by mouth at bedtime. Take 1 tab nightly x 1 week, then 2 capsules/60 mg nightly- for nerve pain- take with reduced dose of Prozac, Disp: 60 capsule, Rfl: 5   ezetimibe (ZETIA) 10 MG tablet, Take 10 mg by mouth daily., Disp: , Rfl:    fenofibrate (TRICOR) 48 MG tablet, TAKE 1 TABLET BY MOUTH DAILY, Disp: 30 tablet, Rfl: 0   FLUoxetine (PROZAC) 20 MG capsule, Take 3 capsules (60 mg total) by mouth daily., Disp: 270 capsule, Rfl: 3    glucose blood (ACCU-CHEK AVIVA PLUS) test strip, Test BS 4 times daily Dx E11.9, Disp: 400 strip, Rfl: 3   icosapent Ethyl (VASCEPA) 1 g capsule, Take 2 capsules (2 g total) by mouth 2 (two) times daily., Disp: , Rfl:    losartan (COZAAR) 25 MG tablet, Take 1 tablet by mouth daily., Disp: , Rfl:    medroxyPROGESTERone (PROVERA) 5 MG tablet, TAKE 1 TABLET BY MOUTH DAILY, Disp: 90 tablet, Rfl: 2   metFORMIN (GLUCOPHAGE) 1000 MG tablet, Take 1 tablet (1,000 mg total) by mouth 2 (two) times daily., Disp: 180 tablet, Rfl: 3   nitroGLYCERIN (NITROSTAT) 0.4 MG SL tablet, DISSOLVE 1 TABLET UNDER THE TONGUE EVERY 5 MINUTES AS NEEDED FOR CHEST PAIN. DO NOT EXCEED A TOTAL OF 3 DOSES IN 15 MINUTES., Disp: 25 tablet, Rfl: 3   pantoprazole (PROTONIX) 40 MG tablet, TAKE 1 TABLET BY MOUTH DAILY, Disp: 30 tablet, Rfl: 1   potassium chloride SA (KLOR-CON M) 20 MEQ tablet, Take 1 tablet (20 mEq total) by mouth daily as needed (when taking Torsemide)., Disp: 90 tablet, Rfl: 1   pregabalin (LYRICA) 150 MG capsule, Take 1 capsule (150 mg total) by mouth 2 (two) times daily., Disp: 60 capsule, Rfl: 3   ranolazine (RANEXA) 1000 MG SR tablet, TAKE 1 TABLET BY MOUTH TWICE DAILY, Disp: 180 tablet, Rfl: 3   REPATHA SURECLICK 086 MG/ML SOAJ, INJECT 140 MG INTO THE SKIN EVERY 14 DAYS, Disp: 2 mL, Rfl: 6   rosuvastatin (CRESTOR) 40 MG tablet, TAKE 1 TABLET BY MOUTH DAILY, Disp: 30 tablet, Rfl: 6   Semaglutide,0.25 or 0.5MG/DOS, (OZEMPIC, 0.25 OR 0.5 MG/DOSE,) 2 MG/1.5ML SOPN, Inject 0.5 mg into the skin once a week. STOP VICTOZA, Disp: 4.5 mL, Rfl: 1   torsemide (DEMADEX) 20 MG tablet, TAKE 2 TABLETS BY MOUTH DAILY AS NEEDED FOR swelling, Disp: 180 tablet, Rfl: 3   traMADol (ULTRAM) 50 MG tablet, Take 2 tablets (100 mg total) by mouth 2 (two) times daily., Disp: 120 tablet, Rfl: 5 Social History   Socioeconomic History   Marital status: Married    Spouse name: Barnabas Lister   Number of children: 2   Years of education: 10   Highest  education level: Not on file  Occupational History   Not on file  Tobacco Use   Smoking status: Some Days    Packs/day: 0.50    Years: 20.00    Total pack years: 10.00    Types: Cigarettes    Start date: 03/18/1990   Smokeless tobacco: Never   Tobacco comments:    Vapes currently   Vaping Use   Vaping Use: Some days   Substances: Nicotine  Substance and Sexual Activity   Alcohol use: Not Currently    Alcohol/week: 0.0 standard drinks of alcohol    Comment: maybe once a year   Drug  use: No   Sexual activity: Yes    Birth control/protection: None    Comment: PCOS  Other Topics Concern   Not on file  Social History Narrative   Unemployed   Applied for disability   Leisure "take care of house and kids"   Walks when able   Social Determinants of Health   Financial Resource Strain: Low Risk  (10/28/2017)   Overall Financial Resource Strain (CARDIA)    Difficulty of Paying Living Expenses: Not hard at all  Food Insecurity: No Food Insecurity (10/28/2017)   Hunger Vital Sign    Worried About Running Out of Food in the Last Year: Never true    [place] in the Last Year: Never true  Transportation Needs: No Transportation Needs (10/28/2017)   PRAPARE - Hydrologist (Medical): No    Lack of Transportation (Non-Medical): No  Physical Activity: Inactive (10/28/2017)   Exercise Vital Sign    Days of Exercise per Week: 0 days    Minutes of Exercise per Session: 0 min  Stress: Stress Concern Present (10/28/2017)   [place]    Feeling of Stress : To some extent  Social Connections: Somewhat Isolated (10/28/2017)   Social Connection and Isolation Panel [NHANES]    Frequency of Communication with Friends and Family: More than three times a week    Frequency of Social Gatherings with Friends and Family: More than three times a week    Attends Religious Services: Never    Corporate treasurer or Organizations: No    Attends Archivist Meetings: Never    Marital Status: Married  Human resources officer Violence: Not At Risk (10/28/2017)   Humiliation, Afraid, Rape, and Kick questionnaire    Fear of Current or Ex-Partner: No    Emotionally Abused: No    Physically Abused: No    Sexually Abused: No   Family History  Problem Relation Age of Onset   Heart murmur Father    Diabetes Father    Hypertension Father    Hyperlipidemia Father    Cancer Father        throat   Diabetes Mother    Cancer Mother        breast.uterine, ovarian   Asthma Son    Appendicitis Son    Hernia Son    Mental illness Son        Bipolar, personality d/o   Stroke Maternal Grandmother    Cancer Maternal Grandmother        breast   Cancer Paternal Grandfather        lung   Colon cancer Neg Hx     Objective: Office vital signs reviewed. BP 130/80   Pulse 86   Temp 97.9 F (36.6 C)   Ht 5' 10"  (1.778 m)   Wt (!) 378 lb 6.4 oz (171.6 kg)   SpO2 96%   BMI 54.29 kg/m   Physical Examination:  General: Awake, alert, super morbidly obese, No acute distress HEENT: Sclera are white. Cardio: regular rate and rhythm, S1S2 heard, no murmurs appreciated Pulm: clear to auscultation bilaterally, no wheezes, rhonchi or rales; normal work of breathing on room air Psych: Mood stable.  Slightly disheveled appearing/poorly groomed     03/16/2022    8:37 AM 02/17/2022    9:56 AM 12/04/2021    9:15 AM  Depression screen PHQ 2/9  Decreased Interest 1 1 1   Down,  Depressed, Hopeless 0 1 1  PHQ - 2 Score 1 2 2   Altered sleeping 3 2 2   Tired, decreased energy 0 1 3  Change in appetite 3 3 2   Feeling bad or failure about yourself  0 0 0  Trouble concentrating 1 2 2   Moving slowly or fidgety/restless 0 0 0  Suicidal thoughts 0 0 0  PHQ-9 Score 8 10 11   Difficult doing work/chores Somewhat difficult  Somewhat difficult      03/16/2022    8:38 AM 12/04/2021    9:16 AM 09/01/2021    8:28 AM  06/08/2021   10:05 AM  GAD 7 : Generalized Anxiety Score  Nervous, Anxious, on Edge 1 2 3 2   Control/stop worrying 1 2 2 2   Worry too much - different things 1 1 2 1   Trouble relaxing 2 2 2 2   Restless 0 1 2 2   Easily annoyed or irritable 2 2 3 1   Afraid - awful might happen 0 0 2 1  Total GAD 7 Score 7 10 16 11   Anxiety Difficulty Somewhat difficult Somewhat difficult Somewhat difficult Somewhat difficult      Assessment/ Plan: 41 y.o. female   Type 2 diabetes mellitus with other specified complication, without long-term current use of insulin ([place]) - Plan: Bayer DCA Hb A1c Waived, Semaglutide, 1 MG/DOSE, 4 MG/3ML SOPN  Hypertension associated with diabetes ([place])  Hyperlipidemia associated with type 2 diabetes mellitus ([place]) - Plan: Lipid Panel  Coronary artery disease involving native coronary artery of native heart without angina pectoris - Plan: Lipid Panel  Generalized anxiety disorder - Plan: FLUoxetine (PROZAC) 10 MG capsule  Sugar is not controlled with A1c rising above 7 now.  Advance Ozempic to 1 mg weekly  Blood pressures controlled.  No changes  Check lipid panel.  CC to cardiology.  Continue current regimen for now  I think that her risk of serotonin syndrome and QTc prolongation is a little too high considering concomitant use of now SNRI and tramadol.  For this reason we are going to reduce her off of the Prozac.  She will decrease to 20 mg daily for 4 weeks then 10 mg daily for 4 weeks then off.  I suspect that her specialist may advance her Cymbalta even further to get better pain control so we will try be proactive about getting her off of the SSRI and reducing medication risks  Orders Placed This Encounter  Procedures   Bayer DCA Hb A1c Waived   Meds ordered this encounter  Medications   FLUoxetine (PROZAC) 10 MG capsule    Sig: Take 2 capsules (20 mg total) by mouth daily for 28 days, THEN 1 capsule (10 mg total) daily for 28 days. Then stop.     Dispense:  84 capsule    Refill:  0   Semaglutide, 1 MG/DOSE, 4 MG/3ML SOPN    Sig: Inject 1 mg as directed once a week. To REPLACE 0.54m Ozempic    Dispense:  9 mL    Refill:  [place] DO [place]([id number]

## 2022-03-17 LAB — LIPID PANEL
Chol/HDL Ratio: 3.2 ratio (ref 0.0–4.4)
Cholesterol, Total: 87 mg/dL — ABNORMAL LOW (ref 100–199)
HDL: 27 mg/dL — ABNORMAL LOW (ref >39)
LDL Chol Calc (NIH): 32 mg/dL (ref 0–99)
Triglycerides: 166 mg/dL — ABNORMAL HIGH (ref 0–149)
VLDL Cholesterol Cal: 28 mg/dL (ref 5–40)

## 2022-03-22 ENCOUNTER — Encounter: Payer: Self-pay | Admitting: Physical Medicine and Rehabilitation

## 2022-03-22 ENCOUNTER — Telehealth: Payer: Self-pay | Admitting: Family Medicine

## 2022-03-22 NOTE — Telephone Encounter (Signed)
Pts spouse called stating that pt has a UTI and needs appt today. Says pt has known that shes had a UTI for 1-2 days but says shes been bleeding some today.  Explained to spouse that we are fully booked today and that if they would have called [place] this morning to make an appt, we deff could have seen her today because we had plenty of openings.  Offered him appt for tomorrow for pt. He declined. Says he is helping build a house and cant take off time from that.  Please advise and call pt or spouse.

## 2022-03-22 NOTE — Telephone Encounter (Signed)
Spoke with Spouse. Made an appt with Je tomorrow.

## 2022-03-23 ENCOUNTER — Ambulatory Visit: Payer: Medicaid Other | Admitting: Nurse Practitioner

## 2022-03-23 ENCOUNTER — Encounter: Payer: Self-pay | Admitting: Nurse Practitioner

## 2022-03-23 ENCOUNTER — Ambulatory Visit (INDEPENDENT_AMBULATORY_CARE_PROVIDER_SITE_OTHER): Payer: Medicaid Other

## 2022-03-23 VITALS — BP 140/82 | HR 97 | Ht 70.0 in | Wt 374.0 lb

## 2022-03-23 DIAGNOSIS — R300 Dysuria: Secondary | ICD-10-CM

## 2022-03-23 DIAGNOSIS — R319 Hematuria, unspecified: Secondary | ICD-10-CM

## 2022-03-23 LAB — URINALYSIS, COMPLETE
Bilirubin, UA: NEGATIVE
Glucose, UA: NEGATIVE
Nitrite, UA: POSITIVE — AB
Specific Gravity, UA: 1.025 (ref 1.005–1.030)
Urobilinogen, Ur: 2 mg/dL — ABNORMAL HIGH (ref 0.2–1.0)
pH, UA: 5 (ref 5.0–7.5)

## 2022-03-23 LAB — MICROSCOPIC EXAMINATION
Epithelial Cells (non renal): NONE SEEN /hpf (ref 0–10)
RBC, Urine: >30 /hpf — AB (ref 0–2)
Renal Epithel, UA: NONE SEEN /hpf

## 2022-03-23 MED ORDER — NITROFURANTOIN MONOHYD MACRO 100 MG PO CAPS: 100.0000 mg | ORAL_CAPSULE | Freq: Two times a day (BID) | ORAL | 0 refills | Status: DC

## 2022-03-24 ENCOUNTER — Other Ambulatory Visit: Payer: Self-pay | Admitting: Nurse Practitioner

## 2022-03-24 ENCOUNTER — Ambulatory Visit (HOSPITAL_COMMUNITY)
Admission: RE | Admit: 2022-03-24 | Discharge: 2022-03-24 | Disposition: A | Discharge status: Home / Self Care | Payer: Medicaid Other | Source: Ambulatory Visit | Attending: Nurse Practitioner | Admitting: Nurse Practitioner

## 2022-03-24 DIAGNOSIS — R300 Dysuria: Secondary | ICD-10-CM | POA: Diagnosis not present

## 2022-03-24 DIAGNOSIS — N200 Calculus of kidney: Secondary | ICD-10-CM

## 2022-03-24 DIAGNOSIS — R319 Hematuria, unspecified: Secondary | ICD-10-CM | POA: Insufficient documentation

## 2022-03-26 LAB — URINE CULTURE

## 2022-03-28 NOTE — Progress Notes (Deleted)
[name], female    DOB: 07-26-1981,     MRN: [id number]   Brief patient profile:  31   yowf active smoker (changed cigs to vapes 05/2021)   referred to pulmonary clinic in [place]  06/18/2021 by Dr  Wyline Mood for sob s/p covid July 2022      History of Present Illness  06/18/2021  Pulmonary/ 1st office eval/ [name] / Sales executive Complaint  Patient presents with   Consult    Lingering Sob after covid. Approx. 2 months. Pt states its the worst after walking or activity/exertion. Laying on back makes it hard to breathe as well. Cough isnt normally productive but pt says she has not had a lot of mucus- Clear/yellow/brown.   Baseline = April 2022 at wt present 425 lb > used HC parking due back/ neuropathy could do shopping pushing cart  Dyspnea:  50 ft flt since July 2022 covid  / better if uses saba  Cough: variable / miminal mucoid  Sleep: props [place] bunch of pillows x years  SABA use: some better p rx but doesn't really pre or rechallenge Rec Plan A = Automatic = Always=    Breztri Take 2 puffs first thing in am and then another 2 puffs about 12 hours later.   Work on inhaler technique:   Call if you really do better on breztri than the other ones you've used and we'll see if we can get insurance to pay for it or similar drug  Plan B = Backup (to supplement plan A, not to replace it) Only use your albuterol inhaler as a rescue medication  The key is to stop smoking completely before smoking completely stops you!   PFTs nl ? P breztri 08/12/21 ERV 21% at wt 386   08/17/2021  f/u ov/[place] office/[name] re: doe/ MO /smoker maint on symbicort 160 Dyspnea:  walking across the street to take care of couple has to stop 3 times /  Cough: p vapes  Sleeping: 45 degrees x 2017  SABA use: worse since ran out / did not  need on breztri 02: none Covid status: reaction to first shot  and then had omicron summer 2022 Rec We will try a PA for your [name]  and start bisoprolol 5 mg take one twice daily (take two every 12 hours if not satisfied)  In meantime: Plan A = Automatic = Always=    Symbicort 160 Take 2 puffs first thing in am and then another 2 puffs about 12 hours later.   Work on inhaler technique:    Plan B = Backup (to supplement plan A, not to replace it) Only use your albuterol inhaler as a rescue medication  Ok to try albuterol 15 min before an activity (on alternating days)  that you know would usually make you short of breath and see if it makes any difference  Please schedule a follow up visit in 6  months but call sooner if needed     03/29/2022  f/u ov/[name] re: ***   maint on ***  No chief complaint on file.   Dyspnea:  *** Cough: *** Sleeping: *** SABA use: *** 02: *** Covid status:   ***   No obvious day to day or daytime variability or assoc excess/ purulent sputum or mucus plugs or hemoptysis or cp or chest tightness, subjective wheeze or overt sinus or hb symptoms.   *** without nocturnal  or early am exacerbation  of respiratory  c/o's or need for noct saba. Also denies any obvious fluctuation of symptoms with weather or environmental changes or other aggravating or alleviating factors except as outlined above   No unusual exposure hx or h/o childhood pna/ asthma or knowledge of premature birth.  Current Allergies, Complete Past Medical History, Past Surgical History, Family History, and Social History were reviewed in [name].  ROS  The following are not active complaints unless bolded Hoarseness, sore throat, dysphagia, dental problems, itching, sneezing,  nasal congestion or discharge of excess mucus or purulent secretions, ear ache,   fever, chills, sweats, unintended wt loss or wt gain, classically pleuritic or exertional cp,  orthopnea pnd or arm/hand swelling  or leg swelling, presyncope, palpitations, abdominal pain, anorexia, nausea, vomiting, diarrhea  or change in bowel  habits or change in bladder habits, change in stools or change in urine, dysuria, hematuria,  rash, arthralgias, visual complaints, headache, numbness, weakness or ataxia or problems with walking or coordination,  change in mood or  memory.        No outpatient medications have been marked as taking for the 03/29/22 encounter (Appointment) with Nyoka Cowden, MD.             Past Medical History:  Diagnosis Date   Allergy    Anemia    Anginal pain (HCC)    Anxiety    CHF (congestive heart failure) (HCC)    Chronic kidney disease    history of kidney stones   Coronary artery disease 09/2011   a.s/p BMS to LAD and angioplasty of RI in 09/2011 b. patent stent by cath in 02/2012, 12/2013, and 02/2016 with most recent showing 30-40% dLAD and 30-40% RCA stenosis   Depression    Diabetes mellitus    Dyspnea    Hyperlipidemia    Hypertension    Midsternal chest pain 03/03/2012   Migraines    Morbid obesity (HCC)    Myocardial infarct (HCC) 09/2011   Myocardial infarction (HCC) 02/2016   Neuromuscular disorder (HCC)    Palpitations 02/17/2012   Ureteral stone with hydronephrosis    Urinary tract obstruction due to kidney stone 02/15/2017       Objective:    03/29/2022              ***   08/17/21 (!) 393 lb (178.3 kg)  06/18/21 (!) 392 lb 1.3 oz (177.8 kg)  06/08/21 (!) 389 lb (176.4 kg)                   Assessment

## 2022-03-29 ENCOUNTER — Ambulatory Visit: Payer: Medicaid Other | Admitting: Internal Medicine

## 2022-03-31 ENCOUNTER — Encounter: Payer: Self-pay | Admitting: Urology

## 2022-03-31 ENCOUNTER — Ambulatory Visit: Payer: Medicaid Other | Admitting: Urology

## 2022-03-31 ENCOUNTER — Encounter: Payer: Self-pay | Admitting: *Deleted

## 2022-03-31 VITALS — BP 115/75 | HR 97

## 2022-03-31 DIAGNOSIS — N200 Calculus of kidney: Secondary | ICD-10-CM | POA: Diagnosis not present

## 2022-03-31 DIAGNOSIS — N3001 Acute cystitis with hematuria: Secondary | ICD-10-CM | POA: Diagnosis not present

## 2022-03-31 LAB — URINALYSIS, ROUTINE W REFLEX MICROSCOPIC
Bilirubin, UA: POSITIVE — AB
Glucose, UA: NEGATIVE
Leukocytes,UA: NEGATIVE
Nitrite, UA: NEGATIVE
Specific Gravity, UA: 1.025 (ref 1.005–1.030)
Urobilinogen, Ur: 2 mg/dL — ABNORMAL HIGH (ref 0.2–1.0)
pH, UA: 5.5 (ref 5.0–7.5)

## 2022-03-31 LAB — MICROSCOPIC EXAMINATION
RBC, Urine: >30 /hpf — AB (ref 0–2)
Renal Epithel, UA: NONE SEEN /hpf
WBC, UA: NONE SEEN /hpf (ref 0–5)

## 2022-03-31 NOTE — H&P (View-Only) (Signed)
03/31/2022 3:26 PM   [name] 05-06-1981 [id number]  Referring provider: Janora Norlander, DO [place],  [place] [address]  nephrolithiasis   HPI: [name] is a 41yo here for evaluation of nephrolithiasis. She is having intermittent left flank pain and underwent renal US 03/24/2022 shows a 47m left mid pole calculus. She was last seen 5 years ago for nephrolithiasis and underwent ureteroscopy. She was diagnosed with UTI 1 week ago and was treated with macrobid. She is having intermittent dark urine   PMH: Past Medical History:  Diagnosis Date   Allergy    Anemia    Anginal pain (HCC)    Anxiety    CHF (congestive heart failure) (HCC)    Ch[facility] [place]hmatic bronchitis ([facility] 08/17/2021   Chronic kidney disease    history of kidney stones   Coronary artery disease 09/2011   a.s/p BMS to LAD and angioplasty of RI in 09/2011 b. patent stent by cath in 02/2012, 12/2013, and 02/2016 with most recent showing 30-40% dLAD and 30-40% RCA stenosis   Depression    Diabetes mellitus    Dyspnea    Hyperlipidemia    Hypertension    Midsternal chest pain 03/03/2012   Migraines    Morbid obesity ([facility]    Myocardial infarct ([facility] 09/2011   [place]ocardial infarction ([facility] 02/2016   Neuromuscular disorder ([facility]    Palpitations 02/17/2012   Ureteral stone with hydronephrosis    Urinary tract obstruction due to kidney stone 02/15/2017    Surgical History: Past Surgical History:  Procedure Laterality Date   BIOPSY  10/27/2020   Procedure: BIOPSY;  Surgeon: RDaneil Dolin MD;  [facility];  Service: Endoscopy;;   CARDIAC CATHETERIZATION  10/08/2011   LAD: 95% mid, Ramus: 95% ostial   CARPAL TUNNEL RELEASE     CESAREAN SECTION     CHOLECYSTECTOMY     CORONARY ANGIOPLASTY WITH STENT PLACEMENT  10/08/2011   LAD: BMS, Ramus: cutting balloon angioplasty   CORONARY STENT INTERVENTION N/A 01/14/2020   Procedure: CORONARY STENT INTERVENTION;  Surgeon: HLeonie Man MD;  [facility];  Service: Cardiovascular;  Laterality: N/A;   CYSTOSCOPY WITH RETROGRADE PYELOGRAM, URETEROSCOPY AND STENT PLACEMENT Left 02/16/2017   Procedure: CYSTOSCOPY WITH RETROGRADE PYELOGRAM, URETEROSCOPY AND STENT PLACEMENT;  Surgeon: OKathie Rhodes MD;  Location: [facility];  Service: Urology;  Laterality: Left;   CYSTOSCOPY WITH RETROGRADE PYELOGRAM, URETEROSCOPY AND STENT PLACEMENT Left 03/28/2017   Procedure: LEFT STENT REMOVAL LEFT RETROGRADE PYELOGRAM LEFT URETEROSCOPY   LASER LITHOTRIPSY  AND  STENT REPLACEMENT;  Surgeon: MCleon Gustin MD;  Location: [facility];  Service: Urology;  Laterality: Left;   ESOPHAGOGASTRODUODENOSCOPY (EGD) WITH PROPOFOL N/A 10/27/2020   Rourk: mild erosive reflux esophagitis, h.pylori gastritis   LEFT HEART CATH AND CORONARY ANGIOGRAPHY N/A 08/15/2018   Procedure: LEFT HEART CATH AND CORONARY ANGIOGRAPHY;  Surgeon: JMartinique Peter M, MD;  [facility];  Service: Cardiovascular;  Laterality: N/A;   LEFT HEART CATH AND CORONARY ANGIOGRAPHY N/A 01/14/2020   Procedure: LEFT HEART CATH AND CORONARY ANGIOGRAPHY;  Surgeon: HLeonie Man MD;  [facility];  Service: Cardiovascular;  Laterality: N/A;   LEFT HEART CATHETERIZATION WITH CORONARY ANGIOGRAM N/A 10/08/2011   Procedure: LEFT HEART CATHETERIZATION WITH CORONARY ANGIOGRAM;  Surgeon: Peter M JMartinique MD;  [facility];  Service: Cardiovascular;  Laterality: N/A;   LEFT HEART CATHETERIZATION WITH CORONARY ANGIOGRAM N/A 03/02/2012  Procedure: LEFT HEART CATHETERIZATION WITH CORONARY ANGIOGRAM;  Surgeon: Burnell Blanks, MD;  Location: [facility] CATH LAB;  Service: Cardiovascular;  Laterality: N/A;   LEFT HEART CATHETERIZATION WITH CORONARY ANGIOGRAM N/A 01/04/2014   Procedure: LEFT HEART CATHETERIZATION WITH CORONARY ANGIOGRAM;  Surgeon: Troy Sine, MD;  Location: [facility] CATH LAB;  Service: Cardiovascular;  Laterality: N/A;   right hand pinky  surgery      Home Medications:  Allergies as of 03/31/2022       Reactions   Bee Venom Anaphylaxis, Swelling   Latex Itching   Misc. Sulfonamide Containing Compounds Nausea And Vomiting   Sulfa Drugs Cross Reactors Nausea And Vomiting        Medication List        Accurate as of March 31, 2022  3:26 PM. If you have any questions, ask your nurse or doctor.          Accu-Chek Aviva Plus test strip Generic drug: glucose blood Test BS 4 times daily Dx E11.9   Accu-Chek Softclix Lancets lancets BOOD SUGAR FOUR TIMES DAILY Dx E11.9   albuterol 108 (90 Base) MCG/ACT inhaler Commonly known as: VENTOLIN HFA Inhale 2 puffs into the lungs every 4 (four) hours as needed for wheezing or shortness of breath.   aspirin 81 MG chewable tablet Chew 81 mg by mouth daily.   bisoprolol 5 MG tablet Commonly known as: ZEBETA One twice daily   blood glucose meter kit and supplies Dispense based on patient and insurance preference. Use up to four times daily as directed. (FOR ICD-10:  E11.22). Check BG daily   budesonide-formoterol 160-4.5 MCG/ACT inhaler Commonly known as: Symbicort Take 2 puffs first thing in am and then another 2 puffs about 12 hours later.   busPIRone 10 MG tablet Commonly known as: BUSPAR TAKE 1 TABLET BY MOUTH TWICE DAILY   clopidogrel 75 MG tablet Commonly known as: PLAVIX TAKE 1 TABLET BY MOUTH EVERY DAY   DULoxetine 30 MG capsule Commonly known as: Cymbalta Take 1 capsule (30 mg total) by mouth at bedtime. Take 1 tab nightly x 1 week, then 2 capsules/60 mg nightly- for nerve pain- take with reduced dose of Prozac   ezetimibe 10 MG tablet Commonly known as: ZETIA Take 10 mg by mouth daily.   fenofibrate 48 MG tablet Commonly known as: TRICOR TAKE 1 TABLET BY MOUTH DAILY   FLUoxetine 10 MG capsule Commonly known as: PROZAC Take 2 capsules (20 mg total) by mouth daily for 28 days, THEN 1 capsule (10 mg total) daily for 28 days. Then stop. Start  taking on: March 16, 2022   icosapent Ethyl 1 g capsule Commonly known as: Vascepa Take 2 capsules (2 g total) by mouth 2 (two) times daily.   losartan 25 MG tablet Commonly known as: COZAAR Take 1 tablet by mouth daily.   medroxyPROGESTERone 5 MG tablet Commonly known as: PROVERA TAKE 1 TABLET BY MOUTH DAILY   metFORMIN 1000 MG tablet Commonly known as: GLUCOPHAGE Take 1 tablet (1,000 mg total) by mouth 2 (two) times daily.   nitrofurantoin (macrocrystal-monohydrate) 100 MG capsule Commonly known as: Macrobid Take 1 capsule (100 mg total) by mouth 2 (two) times daily. 1 po BId   nitroGLYCERIN 0.4 MG SL tablet Commonly known as: NITROSTAT DISSOLVE 1 TABLET UNDER THE TONGUE EVERY 5 MINUTES AS NEEDED FOR CHEST PAIN. DO NOT EXCEED A TOTAL OF 3 DOSES IN 15 MINUTES.   pantoprazole 40 MG tablet Commonly known as: PROTONIX TAKE 1 TABLET  BY MOUTH DAILY   potassium chloride SA 20 MEQ tablet Commonly known as: KLOR-CON M Take 1 tablet (20 mEq total) by mouth daily as needed (when taking Torsemide).   pregabalin 150 MG capsule Commonly known as: Lyrica Take 1 capsule (150 mg total) by mouth 2 (two) times daily.   ranolazine 1000 MG SR tablet Commonly known as: RANEXA TAKE 1 TABLET BY MOUTH TWICE DAILY   Repatha SureClick 500 MG/ML Soaj Generic drug: Evolocumab INJECT 140 MG INTO THE SKIN EVERY 14 DAYS   rosuvastatin 40 MG tablet Commonly known as: CRESTOR TAKE 1 TABLET BY MOUTH DAILY   Semaglutide (1 MG/DOSE) 4 MG/3ML Sopn Inject 1 mg as directed once a week. To REPLACE 0.1m Ozempic   torsemide 20 MG tablet Commonly known as: DEMADEX TAKE 2 TABLETS BY MOUTH DAILY AS NEEDED FOR swelling   traMADol 50 MG tablet Commonly known as: ULTRAM Take 2 tablets (100 mg total) by mouth 2 (two) times daily.        Allergies:  Allergies  Allergen Reactions   Bee Venom Anaphylaxis and Swelling   Latex Itching   Misc. Sulfonamide Containing Compounds Nausea And Vomiting    Sulfa Drugs Cross Reactors Nausea And Vomiting    Family History: Family History  Problem Relation Age of Onset   Heart murmur Father    Diabetes Father    Hypertension Father    Hyperlipidemia Father    Cancer Father        throat   Diabetes Mother    Cancer Mother        breast.uterine, ovarian   Asthma Son    Appendicitis Son    Hernia Son    Mental illness Son        Bipolar, personality d/o   Stroke Maternal Grandmother    Cancer Maternal Grandmother        breast   Cancer Paternal Grandfather        lung   Colon cancer Neg Hx     Social History:  reports that she has been smoking cigarettes. She started smoking about 32 years ago. She has a 10.00 pack-year smoking history. She has never used smokeless tobacco. She reports that she does not currently use alcohol. She reports that she does not use drugs.  ROS: All other review of systems were reviewed and are negative except what is noted above in HPI  Physical Exam: BP 115/75   Pulse 97   Constitutional:  Alert and oriented, No acute distress. HEENT: [place] AT, moist mucus membranes.  Trachea midline, no masses. Cardiovascular: No clubbing, cyanosis, or edema. Respiratory: Normal respiratory effort, no increased work of breathing. GI: Abdomen is soft, nontender, nondistended, no abdominal masses GU: No CVA tenderness.  Lymph: No cervical or inguinal lymphadenopathy. Skin: No rashes, bruises or suspicious lesions. Neurologic: Grossly intact, no focal deficits, moving all 4 extremities. Psychiatric: Normal mood and affect.  Laboratory Data: Lab Results  Component Value Date   WBC 10.9 (H) 12/03/2020   HGB 14.6 12/03/2020   HCT 42.9 12/03/2020   MCV 89 12/03/2020   PLT 280 12/03/2020    Lab Results  Component Value Date   CREATININE 0.67 12/04/2021    No results found for: "PSA"  No results found for: "TESTOSTERONE"  Lab Results  Component Value Date   HGBA1C 7.3 (H) 03/16/2022    Urinalysis     Component Value Date/Time   COLORURINE YELLOW 01/27/2018 1523   APPEARANCEUR Cloudy (A) 03/23/2022 1221  LABSPEC 1.015 01/27/2018 1523   PHURINE 5.0 01/27/2018 1523   GLUCOSEU Negative 03/23/2022 1221   HGBUR MODERATE (A) 01/27/2018 1523   BILIRUBINUR Negative 03/23/2022 1221   KETONESUR NEGATIVE 01/27/2018 1523   PROTEINUR 3+ (A) 03/23/2022 1221   PROTEINUR NEGATIVE 01/27/2018 1523   NITRITE Positive (A) 03/23/2022 1221   NITRITE POSITIVE (A) 01/27/2018 1523   LEUKOCYTESUR 3+ (A) 03/23/2022 1221    Lab Results  Component Value Date   LABMICR See below: 03/23/2022   WBCUA 6-10 (A) 03/23/2022   LABEPIT None seen 03/23/2022   BACTERIA Many (A) 03/23/2022    Pertinent Imaging: Renal US 03/24/2022: Images reviewed and discussed with the patient  Results for orders placed in visit on 03/23/22  DG Abd 1 View  Narrative CLINICAL DATA:  Hematuria.  History of kidney stone.  EXAM: ABDOMEN - 1 VIEW  COMPARISON:  March 28, 2017  FINDINGS: Both kidneys are partially obscured by bowel contents. Within this limitation, no renal stones are identified. No ureteral stones are identified. No cause for hematuria noted.  IMPRESSION: No renal or ureteral stones identified. No cause for hematuria identified.   Electronically Signed By: Dorise Bullion III M.D. On: 03/23/2022 19:18  No results found for this or any previous visit.  No results found for this or any previous visit.  No results found for this or any previous visit.  Results for orders placed in visit on 03/23/22  US Renal  Narrative CLINICAL DATA:  Hematuria  EXAM: RENAL / URINARY TRACT ULTRASOUND COMPLETE  COMPARISON:  Ultrasound 05/16/2017, CT 05/18/2020  FINDINGS: Right Kidney:  Renal measurements: 13.4 x 8.5 x 8.8 cm = volume: 544 mL. Echogenicity within normal limits. No mass, shadowing stone, or hydronephrosis visualized.  Left Kidney:  Renal measurements: 15.4 x 6.3 x 7.0 = volume: 359 mL.  Echogenicity within normal limits. Multifocal areas of cortical scarring. 7 mm echogenic, shadowing stone within the midpole of the left kidney. No mass or hydronephrosis visualized.  Bladder:  Appears normal for degree of bladder distention.  Other:  None.  IMPRESSION: 1. Nonobstructing 7 mm left renal calculus. 2. Unremarkable right kidney.   Electronically Signed By: Davina Poke D.O. On: 03/24/2022 11:51  No results found for this or any previous visit.  No results found for this or any previous visit.  No results found for this or any previous visit.   Assessment & Plan:    1. Kidney stones -We discussed the management of kidney stones. These options include observation, ureteroscopy, shockwave lithotripsy (ESWL) and percutaneous nephrolithotomy (PCNL). We discussed which options are relevant to the patient's stone(s). We discussed the natural history of kidney stones as well as the complications of untreated stones and the impact on quality of life without treatment as well as with each of the above listed treatments. We also discussed the efficacy of each treatment in its ability to clear the stone burden. With any of these management options I discussed the signs and symptoms of infection and the need for emergent treatment should these be experienced. For each option we discussed the ability of each procedure to clear the patient of their stone burden.   For observation I described the risks which include but are not limited to silent renal damage, life-threatening infection, need for emergent surgery, failure to pass stone and pain.   For ureteroscopy I described the risks which include bleeding, infection, damage to contiguous structures, positioning injury, ureteral stricture, ureteral avulsion, ureteral injury, need for prolonged  ureteral stent, inability to perform ureteroscopy, need for an interval procedure, inability to clear stone burden, stent  discomfort/pain, heart attack, stroke, pulmonary embolus and the inherent risks with general anesthesia.   For shockwave lithotripsy I described the risks which include arrhythmia, kidney contusion, kidney hemorrhage, need for transfusion, pain, inability to adequately break up stone, inability to pass stone fragments, Steinstrasse, infection associated with obstructing stones, need for alternate surgical procedure, need for repeat shockwave lithotripsy, MI, CVA, PE and the inherent risks with anesthesia/conscious sedation.   For PCNL I described the risks including positioning injury, pneumothorax, hydrothorax, need for chest tube, inability to clear stone burden, renal laceration, arterial venous fistula or malformation, need for embolization of kidney, loss of kidney or renal function, need for repeat procedure, need for prolonged nephrostomy tube, ureteral avulsion, MI, CVA, PE and the inherent risks of general anesthesia.   - The patient would like to proceed with left ureteroscopic stone extraction.  - Urinalysis, Routine w reflex microscopic   No follow-ups on file.  Nicolette Bang, MD  [facility] [place]

## 2022-03-31 NOTE — Patient Instructions (Signed)
Dietary Guidelines to Help Prevent Kidney Stones Kidney stones are deposits of minerals and salts that form inside your kidneys. Your risk of developing kidney stones may be greater depending on your diet, your lifestyle, the medicines you take, and whether you have certain medical conditions. Most people can lower their chances of developing kidney stones by following the instructions below. Your dietitian may give you more specific instructions depending on your overall health and the type of kidney stones you tend to develop. What are tips for following this plan? Reading food labels  Choose foods with "no salt added" or "low-salt" labels. Limit your salt (sodium) intake to less than 1,500 mg a day. Choose foods with calcium for each meal and snack. Try to eat about 300 mg of calcium at each meal. Foods that contain 200-500 mg of calcium a serving include: 8 oz (237 mL) of milk, calcium-fortifiednon-dairy milk, and calcium-fortifiedfruit juice. Calcium-fortified means that calcium has been added to these drinks. 8 oz (237 mL) of kefir, yogurt, and soy yogurt. 4 oz (114 g) of tofu. 1 oz (28 g) of cheese. 1 cup (150 g) of dried figs. 1 cup (91 g) of cooked broccoli. One 3 oz (85 g) can of sardines or mackerel. Most people need 1,000-1,500 mg of calcium a day. Talk to your dietitian about how much calcium is recommended for you. Shopping Buy plenty of fresh fruits and vegetables. Most people do not need to avoid fruits and vegetables, even if these foods contain nutrients that may contribute to kidney stones. When shopping for convenience foods, choose: Whole pieces of fruit. Pre-made salads with dressing on the side. Low-fat fruit and yogurt smoothies. Avoid buying frozen meals or prepared deli foods. These can be high in sodium. Look for foods with live cultures, such as yogurt and kefir. Choose high-fiber grains, such as whole-wheat breads, oat bran, and wheat cereals. Cooking Do not add  salt to food when cooking. Place a salt shaker on the table and allow each person to add his or her own salt to taste. Use vegetable protein, such as beans, textured vegetable protein (TVP), or tofu, instead of meat in pasta, casseroles, and soups. Meal planning Eat less salt, if told by your dietitian. To do this: Avoid eating processed or pre-made food. Avoid eating fast food. Eat less animal protein, including cheese, meat, poultry, or fish, if told by your dietitian. To do this: Limit the number of times you have meat, poultry, fish, or cheese each week. Eat a diet free of meat at least 2 days a week. Eat only one serving each day of meat, poultry, fish, or seafood. When you prepare animal protein, cut pieces into small portion sizes. For most meat and fish, one serving is about the size of the palm of your hand. Eat at least five servings of fresh fruits and vegetables each day. To do this: Keep fruits and vegetables on hand for snacks. Eat one piece of fruit or a handful of berries with breakfast. Have a salad and fruit at lunch. Have two kinds of vegetables at dinner. Limit foods that are high in a substance called oxalate. These include: Spinach (cooked), rhubarb, beets, sweet potatoes, and Swiss chard. Peanuts. Potato chips, french fries, and baked potatoes with skin on. Nuts and nut products. Chocolate. If you regularly take a diuretic medicine, make sure to eat at least 1 or 2 servings of fruits or vegetables that are high in potassium each day. These include: Avocado. Banana. Orange, prune,   carrot, or tomato juice. Baked potato. Cabbage. Beans and split peas. Lifestyle  Drink enough fluid to keep your urine pale yellow. This is the most important thing you can do. Spread your fluid intake throughout the day. If you drink alcohol: Limit how much you use to: 0-1 drink a day for women who are not pregnant. 0-2 drinks a day for men. Be aware of how much alcohol is in your  drink. In the [place] one drink equals one 12 oz bottle of beer (355 mL), one 5 oz glass of wine (148 mL), or one 1 oz glass of hard liquor (44 mL). Lose weight if told by your health care provider. Work with your dietitian to find an eating plan and weight loss strategies that work best for you. General information Talk to your health care provider and dietitian about taking daily supplements. You may be told the following depending on your health and the cause of your kidney stones: Not to take supplements with vitamin C. To take a calcium supplement. To take a daily probiotic supplement. To take other supplements such as magnesium, fish oil, or vitamin B6. Take over-the-counter and prescription medicines only as told by your health care provider. These include supplements. What foods should I limit? Limit your intake of the following foods, or eat them as told by your dietitian. Vegetables Spinach. Rhubarb. Beets. Canned vegetables. Pickles. Olives. Baked potatoes with skin. Grains Wheat bran. Baked goods. Salted crackers. Cereals high in sugar. Meats and other proteins Nuts. Nut butters. Large portions of meat, poultry, or fish. Salted, precooked, or cured meats, such as sausages, meat loaves, and hot dogs. Dairy Cheese. Beverages Regular soft drinks. Regular vegetable juice. Seasonings and condiments Seasoning blends with salt. Salad dressings. Soy sauce. Ketchup. Barbecue sauce. Other foods Canned soups. Canned pasta sauce. Casseroles. Pizza. Lasagna. Frozen meals. Potato chips. French fries. The items listed above may not be a complete list of foods and beverages you should limit. Contact a dietitian for more information. What foods should I avoid? Talk to your dietitian about specific foods you should avoid based on the type of kidney stones you have and your overall health. Fruits Grapefruit. The item listed above may not be a complete list of foods and beverages you should  avoid. Contact a dietitian for more information. Summary Kidney stones are deposits of minerals and salts that form inside your kidneys. You can lower your risk of kidney stones by making changes to your diet. The most important thing you can do is drink enough fluid. Drink enough fluid to keep your urine pale yellow. Talk to your dietitian about how much calcium you should have each day, and eat less salt and animal protein as told by your dietitian. This information is not intended to replace advice given to you by your health care provider. Make sure you discuss any questions you have with your health care provider. Document Revised: 05/25/2021 Document Reviewed: 05/25/2021 Elsevier Patient Education  2023 [employer]  

## 2022-03-31 NOTE — Progress Notes (Signed)
03/31/2022 3:26 PM   Greer D Burchill 05-06-1981 [id number]  Referring provider: Janora Norlander, DO [place],  [place] [address]  nephrolithiasis   HPI: Ms [name] is a 41yo here for evaluation of nephrolithiasis. She is having intermittent left flank pain and underwent renal US 03/24/2022 shows a 47m left mid pole calculus. She was last seen 5 years ago for nephrolithiasis and underwent ureteroscopy. She was diagnosed with UTI 1 week ago and was treated with macrobid. She is having intermittent dark urine   PMH: Past Medical History:  Diagnosis Date   Allergy    Anemia    Anginal pain (HCC)    Anxiety    CHF (congestive heart failure) (HCC)    Chronic [place]hmatic bronchitis ([facility] 08/17/2021   Chronic kidney disease    history of kidney stones   Coronary artery disease 09/2011   a.s/p BMS to LAD and angioplasty of RI in 09/2011 b. patent stent by cath in 02/2012, 12/2013, and 02/2016 with most recent showing 30-40% dLAD and 30-40% RCA stenosis   Depression    Diabetes mellitus    Dyspnea    Hyperlipidemia    Hypertension    Midsternal chest pain 03/03/2012   Migraines    Morbid obesity ([facility]    Myocardial infarct ([facility] 09/2011   [place]ocardial infarction ([facility] 02/2016   Neuromuscular disorder ([facility]    Palpitations 02/17/2012   Ureteral stone with hydronephrosis    Urinary tract obstruction due to kidney stone 02/15/2017    Surgical History: Past Surgical History:  Procedure Laterality Date   BIOPSY  10/27/2020   Procedure: BIOPSY;  Surgeon: RDaneil Dolin MD;  [facility];  Service: Endoscopy;;   CARDIAC CATHETERIZATION  10/08/2011   LAD: 95% mid, Ramus: 95% ostial   CARPAL TUNNEL RELEASE     CESAREAN SECTION     CHOLECYSTECTOMY     CORONARY ANGIOPLASTY WITH STENT PLACEMENT  10/08/2011   LAD: BMS, Ramus: cutting balloon angioplasty   CORONARY STENT INTERVENTION N/A 01/14/2020   Procedure: CORONARY STENT INTERVENTION;  Surgeon: HLeonie Man MD;  [facility];  Service: Cardiovascular;  Laterality: N/A;   CYSTOSCOPY WITH RETROGRADE PYELOGRAM, URETEROSCOPY AND STENT PLACEMENT Left 02/16/2017   Procedure: CYSTOSCOPY WITH RETROGRADE PYELOGRAM, URETEROSCOPY AND STENT PLACEMENT;  Surgeon: OKathie Rhodes MD;  Location: [facility];  Service: Urology;  Laterality: Left;   CYSTOSCOPY WITH RETROGRADE PYELOGRAM, URETEROSCOPY AND STENT PLACEMENT Left 03/28/2017   Procedure: LEFT STENT REMOVAL LEFT RETROGRADE PYELOGRAM LEFT URETEROSCOPY   LASER LITHOTRIPSY  AND  STENT REPLACEMENT;  Surgeon: MCleon Gustin MD;  Location: [facility];  Service: Urology;  Laterality: Left;   ESOPHAGOGASTRODUODENOSCOPY (EGD) WITH PROPOFOL N/A 10/27/2020   Rourk: mild erosive reflux esophagitis, h.pylori gastritis   LEFT HEART CATH AND CORONARY ANGIOGRAPHY N/A 08/15/2018   Procedure: LEFT HEART CATH AND CORONARY ANGIOGRAPHY;  Surgeon: JMartinique Peter M, MD;  [facility];  Service: Cardiovascular;  Laterality: N/A;   LEFT HEART CATH AND CORONARY ANGIOGRAPHY N/A 01/14/2020   Procedure: LEFT HEART CATH AND CORONARY ANGIOGRAPHY;  Surgeon: HLeonie Man MD;  [facility];  Service: Cardiovascular;  Laterality: N/A;   LEFT HEART CATHETERIZATION WITH CORONARY ANGIOGRAM N/A 10/08/2011   Procedure: LEFT HEART CATHETERIZATION WITH CORONARY ANGIOGRAM;  Surgeon: Peter M JMartinique MD;  [facility];  Service: Cardiovascular;  Laterality: N/A;   LEFT HEART CATHETERIZATION WITH CORONARY ANGIOGRAM N/A 03/02/2012  Procedure: LEFT HEART CATHETERIZATION WITH CORONARY ANGIOGRAM;  Surgeon: Burnell Blanks, MD;  Location: [facility] CATH LAB;  Service: Cardiovascular;  Laterality: N/A;   LEFT HEART CATHETERIZATION WITH CORONARY ANGIOGRAM N/A 01/04/2014   Procedure: LEFT HEART CATHETERIZATION WITH CORONARY ANGIOGRAM;  Surgeon: Troy Sine, MD;  Location: [facility] LAB;  Service: Cardiovascular;  Laterality: N/A;   right hand pinky  surgery      Home Medications:  Allergies as of 03/31/2022       Reactions   Bee Venom Anaphylaxis, Swelling   Latex Itching   Misc. Sulfonamide Containing Compounds Nausea And Vomiting   Sulfa Drugs Cross Reactors Nausea And Vomiting        Medication List        Accurate as of March 31, 2022  3:26 PM. If you have any questions, ask your nurse or doctor.          Accu-Chek Aviva Plus test strip Generic drug: glucose blood Test BS 4 times daily Dx E11.9   Accu-Chek Softclix Lancets lancets BOOD SUGAR FOUR TIMES DAILY Dx E11.9   albuterol 108 (90 Base) MCG/ACT inhaler Commonly known as: VENTOLIN HFA Inhale 2 puffs into the lungs every 4 (four) hours as needed for wheezing or shortness of breath.   aspirin 81 MG chewable tablet Chew 81 mg by mouth daily.   bisoprolol 5 MG tablet Commonly known as: ZEBETA One twice daily   blood glucose meter kit and supplies Dispense based on patient and insurance preference. Use up to four times daily as directed. (FOR ICD-10:  E11.22). Check BG daily   budesonide-formoterol 160-4.5 MCG/ACT inhaler Commonly known as: Symbicort Take 2 puffs first thing in am and then another 2 puffs about 12 hours later.   busPIRone 10 MG tablet Commonly known as: BUSPAR TAKE 1 TABLET BY MOUTH TWICE DAILY   clopidogrel 75 MG tablet Commonly known as: PLAVIX TAKE 1 TABLET BY MOUTH EVERY DAY   DULoxetine 30 MG capsule Commonly known as: Cymbalta Take 1 capsule (30 mg total) by mouth at bedtime. Take 1 tab nightly x 1 week, then 2 capsules/60 mg nightly- for nerve pain- take with reduced dose of Prozac   ezetimibe 10 MG tablet Commonly known as: ZETIA Take 10 mg by mouth daily.   fenofibrate 48 MG tablet Commonly known as: TRICOR TAKE 1 TABLET BY MOUTH DAILY   FLUoxetine 10 MG capsule Commonly known as: PROZAC Take 2 capsules (20 mg total) by mouth daily for 28 days, THEN 1 capsule (10 mg total) daily for 28 days. Then stop. Start  taking on: March 16, 2022   icosapent Ethyl 1 g capsule Commonly known as: Vascepa Take 2 capsules (2 g total) by mouth 2 (two) times daily.   losartan 25 MG tablet Commonly known as: COZAAR Take 1 tablet by mouth daily.   medroxyPROGESTERone 5 MG tablet Commonly known as: PROVERA TAKE 1 TABLET BY MOUTH DAILY   metFORMIN 1000 MG tablet Commonly known as: GLUCOPHAGE Take 1 tablet (1,000 mg total) by mouth 2 (two) times daily.   nitrofurantoin (macrocrystal-monohydrate) 100 MG capsule Commonly known as: Macrobid Take 1 capsule (100 mg total) by mouth 2 (two) times daily. 1 po BId   nitroGLYCERIN 0.4 MG SL tablet Commonly known as: NITROSTAT DISSOLVE 1 TABLET UNDER THE TONGUE EVERY 5 MINUTES AS NEEDED FOR CHEST PAIN. DO NOT EXCEED A TOTAL OF 3 DOSES IN 15 MINUTES.   pantoprazole 40 MG tablet Commonly known as: PROTONIX TAKE 1 TABLET  BY MOUTH DAILY   potassium chloride SA 20 MEQ tablet Commonly known as: KLOR-CON M Take 1 tablet (20 mEq total) by mouth daily as needed (when taking Torsemide).   pregabalin 150 MG capsule Commonly known as: Lyrica Take 1 capsule (150 mg total) by mouth 2 (two) times daily.   ranolazine 1000 MG SR tablet Commonly known as: RANEXA TAKE 1 TABLET BY MOUTH TWICE DAILY   Repatha SureClick 500 MG/ML Soaj Generic drug: Evolocumab INJECT 140 MG INTO THE SKIN EVERY 14 DAYS   rosuvastatin 40 MG tablet Commonly known as: CRESTOR TAKE 1 TABLET BY MOUTH DAILY   Semaglutide (1 MG/DOSE) 4 MG/3ML Sopn Inject 1 mg as directed once a week. To REPLACE 0.1m Ozempic   torsemide 20 MG tablet Commonly known as: DEMADEX TAKE 2 TABLETS BY MOUTH DAILY AS NEEDED FOR swelling   traMADol 50 MG tablet Commonly known as: ULTRAM Take 2 tablets (100 mg total) by mouth 2 (two) times daily.        Allergies:  Allergies  Allergen Reactions   Bee Venom Anaphylaxis and Swelling   Latex Itching   Misc. Sulfonamide Containing Compounds Nausea And Vomiting    Sulfa Drugs Cross Reactors Nausea And Vomiting    Family History: Family History  Problem Relation Age of Onset   Heart murmur Father    Diabetes Father    Hypertension Father    Hyperlipidemia Father    Cancer Father        throat   Diabetes Mother    Cancer Mother        breast.uterine, ovarian   Asthma Son    Appendicitis Son    Hernia Son    Mental illness Son        Bipolar, personality d/o   Stroke Maternal Grandmother    Cancer Maternal Grandmother        breast   Cancer Paternal Grandfather        lung   Colon cancer Neg Hx     Social History:  reports that she has been smoking cigarettes. She started smoking about 32 years ago. She has a 10.00 pack-year smoking history. She has never used smokeless tobacco. She reports that she does not currently use alcohol. She reports that she does not use drugs.  ROS: All other review of systems were reviewed and are negative except what is noted above in HPI  Physical Exam: BP 115/75   Pulse 97   Constitutional:  Alert and oriented, No acute distress. HEENT: [place] AT, moist mucus membranes.  Trachea midline, no masses. Cardiovascular: No clubbing, cyanosis, or edema. Respiratory: Normal respiratory effort, no increased work of breathing. GI: Abdomen is soft, nontender, nondistended, no abdominal masses GU: No CVA tenderness.  Lymph: No cervical or inguinal lymphadenopathy. Skin: No rashes, bruises or suspicious lesions. Neurologic: Grossly intact, no focal deficits, moving all 4 extremities. Psychiatric: Normal mood and affect.  Laboratory Data: Lab Results  Component Value Date   WBC 10.9 (H) 12/03/2020   HGB 14.6 12/03/2020   HCT 42.9 12/03/2020   MCV 89 12/03/2020   PLT 280 12/03/2020    Lab Results  Component Value Date   CREATININE 0.67 12/04/2021    No results found for: "PSA"  No results found for: "TESTOSTERONE"  Lab Results  Component Value Date   HGBA1C 7.3 (H) 03/16/2022    Urinalysis     Component Value Date/Time   COLORURINE YELLOW 01/27/2018 1523   APPEARANCEUR Cloudy (A) 03/23/2022 1221  LABSPEC 1.015 01/27/2018 1523   PHURINE 5.0 01/27/2018 1523   GLUCOSEU Negative 03/23/2022 1221   HGBUR MODERATE (A) 01/27/2018 1523   BILIRUBINUR Negative 03/23/2022 1221   KETONESUR NEGATIVE 01/27/2018 1523   PROTEINUR 3+ (A) 03/23/2022 1221   PROTEINUR NEGATIVE 01/27/2018 1523   NITRITE Positive (A) 03/23/2022 1221   NITRITE POSITIVE (A) 01/27/2018 1523   LEUKOCYTESUR 3+ (A) 03/23/2022 1221    Lab Results  Component Value Date   LABMICR See below: 03/23/2022   WBCUA 6-10 (A) 03/23/2022   LABEPIT None seen 03/23/2022   BACTERIA Many (A) 03/23/2022    Pertinent Imaging: Renal US 03/24/2022: Images reviewed and discussed with the patient  Results for orders placed in visit on 03/23/22  DG Abd 1 View  Narrative CLINICAL DATA:  Hematuria.  History of kidney stone.  EXAM: ABDOMEN - 1 VIEW  COMPARISON:  March 28, 2017  FINDINGS: Both kidneys are partially obscured by bowel contents. Within this limitation, no renal stones are identified. No ureteral stones are identified. No cause for hematuria noted.  IMPRESSION: No renal or ureteral stones identified. No cause for hematuria identified.   Electronically Signed By: Dorise Bullion III M.D. On: 03/23/2022 19:18  No results found for this or any previous visit.  No results found for this or any previous visit.  No results found for this or any previous visit.  Results for orders placed in visit on 03/23/22  US Renal  Narrative CLINICAL DATA:  Hematuria  EXAM: RENAL / URINARY TRACT ULTRASOUND COMPLETE  COMPARISON:  Ultrasound 05/16/2017, CT 05/18/2020  FINDINGS: Right Kidney:  Renal measurements: 13.4 x 8.5 x 8.8 cm = volume: 544 mL. Echogenicity within normal limits. No mass, shadowing stone, or hydronephrosis visualized.  Left Kidney:  Renal measurements: 15.4 x 6.3 x 7.0 = volume: 359 mL.  Echogenicity within normal limits. Multifocal areas of cortical scarring. 7 mm echogenic, shadowing stone within the midpole of the left kidney. No mass or hydronephrosis visualized.  Bladder:  Appears normal for degree of bladder distention.  Other:  None.  IMPRESSION: 1. Nonobstructing 7 mm left renal calculus. 2. Unremarkable right kidney.   Electronically Signed By: Davina Poke D.O. On: 03/24/2022 11:51  No results found for this or any previous visit.  No results found for this or any previous visit.  No results found for this or any previous visit.   Assessment & Plan:    1. Kidney stones -We discussed the management of kidney stones. These options include observation, ureteroscopy, shockwave lithotripsy (ESWL) and percutaneous nephrolithotomy (PCNL). We discussed which options are relevant to the patient's stone(s). We discussed the natural history of kidney stones as well as the complications of untreated stones and the impact on quality of life without treatment as well as with each of the above listed treatments. We also discussed the efficacy of each treatment in its ability to clear the stone burden. With any of these management options I discussed the signs and symptoms of infection and the need for emergent treatment should these be experienced. For each option we discussed the ability of each procedure to clear the patient of their stone burden.   For observation I described the risks which include but are not limited to silent renal damage, life-threatening infection, need for emergent surgery, failure to pass stone and pain.   For ureteroscopy I described the risks which include bleeding, infection, damage to contiguous structures, positioning injury, ureteral stricture, ureteral avulsion, ureteral injury, need for prolonged  ureteral stent, inability to perform ureteroscopy, need for an interval procedure, inability to clear stone burden, stent  discomfort/pain, heart attack, stroke, pulmonary embolus and the inherent risks with general anesthesia.   For shockwave lithotripsy I described the risks which include arrhythmia, kidney contusion, kidney hemorrhage, need for transfusion, pain, inability to adequately break up stone, inability to pass stone fragments, Steinstrasse, infection associated with obstructing stones, need for alternate surgical procedure, need for repeat shockwave lithotripsy, MI, CVA, PE and the inherent risks with anesthesia/conscious sedation.   For PCNL I described the risks including positioning injury, pneumothorax, hydrothorax, need for chest tube, inability to clear stone burden, renal laceration, arterial venous fistula or malformation, need for embolization of kidney, loss of kidney or renal function, need for repeat procedure, need for prolonged nephrostomy tube, ureteral avulsion, MI, CVA, PE and the inherent risks of general anesthesia.   - The patient would like to proceed with left ureteroscopic stone extraction.  - Urinalysis, Routine w reflex microscopic   No follow-ups on file.  Nicolette Bang, MD  [facility] [place]

## 2022-04-02 ENCOUNTER — Other Ambulatory Visit: Payer: Self-pay | Admitting: Family Medicine

## 2022-04-02 DIAGNOSIS — E1165 Type 2 diabetes mellitus with hyperglycemia: Secondary | ICD-10-CM

## 2022-04-02 LAB — URINE CULTURE

## 2022-04-04 ENCOUNTER — Other Ambulatory Visit: Payer: Self-pay | Admitting: Family Medicine

## 2022-04-04 ENCOUNTER — Other Ambulatory Visit: Payer: Self-pay | Admitting: Cardiology

## 2022-04-04 DIAGNOSIS — E1140 Type 2 diabetes mellitus with diabetic neuropathy, unspecified: Secondary | ICD-10-CM

## 2022-04-04 DIAGNOSIS — E1169 Type 2 diabetes mellitus with other specified complication: Secondary | ICD-10-CM

## 2022-04-04 DIAGNOSIS — F418 Other specified anxiety disorders: Secondary | ICD-10-CM

## 2022-04-04 DIAGNOSIS — F411 Generalized anxiety disorder: Secondary | ICD-10-CM

## 2022-04-05 ENCOUNTER — Ambulatory Visit: Payer: Medicaid Other | Admitting: Physical Medicine and Rehabilitation

## 2022-04-05 ENCOUNTER — Telehealth: Payer: Self-pay | Admitting: Cardiology

## 2022-04-05 NOTE — Telephone Encounter (Signed)
   Pre-operative Risk Assessment    Patient Name: [name]  DOB: Jan 26, 1981 MRN: [id number]      Request for Surgical Clearance    Procedure:   Cystoscopy with left retrograde left ureteroscopy  with stent placement   Date of Surgery:  Clearance 04/13/22                                 Surgeon:  Dr. Ronne Binning  Surgeon's Group or Practice Name:  [facility]  Phone number:  [phone] Fax number:  [phone]   Type of Clearance Requested:   - Pharmacy:  Hold Clopidogrel (Plavix) 5 days    Type of Anesthesia:  General    Additional requests/questions:    Lajuana Matte   04/05/2022, 2:51 PM

## 2022-04-05 NOTE — Telephone Encounter (Signed)
She is seeing pain management now.

## 2022-04-05 NOTE — Telephone Encounter (Signed)
Dr. Wyline Mood, you saw this patient on 03/01/22. She has extensive CAD and hx of ICM.  - prior anterior MI 09/2011, 95% lesion in mid LAD and ramus with 95% ostial stenosis. LVEF 55% by LV gram, apical hypokinesis. BMS to mid LAD and cutting balloon angioplasty of ramus.   - repeat cath 02/2012 with patent vessels and stent - 09/2011 Echo: LVEF 50% - 12/2013 Lexiscan large anteroseptal ischemia - cath 12/2013 with patent vessels  - cath 02/2016 [facility] in setting of NSTEMI showed LM patent, LAD patent with patent stent, distal LAD 30-40%, LCX patent, RCA 30-40%. LVEDP 40, PCWP 30 mean PA 32, CI 4.93 - echo 02/2016 [place] LVEF 50%, apical akinesis    Would you clear her for upcoming cystoscopy under general anesthesia and may she hold her Plavix for 5 days prior to procedure.  Please route your response to p cv div preop  Thank you, Levi Aland, NP-C    04/05/2022, 4:37 PM [facility] [address], [address] Office [phone] Fax [phone]

## 2022-04-07 ENCOUNTER — Other Ambulatory Visit: Payer: Self-pay | Admitting: Physical Medicine and Rehabilitation

## 2022-04-07 DIAGNOSIS — E1140 Type 2 diabetes mellitus with diabetic neuropathy, unspecified: Secondary | ICD-10-CM

## 2022-04-08 NOTE — Telephone Encounter (Signed)
   Patient Name: [name]  DOB: Jan 26, 1981 MRN: [id number]  Primary Cardiologist: Dina Rich, MD  Chart reviewed as part of pre-operative protocol coverage. Pre-op clearance already addressed by colleagues in earlier phone notes. To summarize recommendations:  -Ok for procedure and ok to hold plavix as needed-Dr. Wyline Mood  Will route this bundled recommendation to requesting provider via Epic fax function and remove from pre-op pool. Please call with questions.  Sharlene Dory, PA-C 04/08/2022, 4:48 PM

## 2022-04-08 NOTE — Telephone Encounter (Signed)
Ok for procedure and ok to hold plavix as needed  Dominga Ferry MD

## 2022-04-09 NOTE — Patient Instructions (Addendum)
[name]  04/09/2022     @PREFPERIOPPHARMACY @   Your procedure is scheduled on  04/13/2022.   Report to 04/15/2022 at  1015  A.M.   Call this number if you have problems the morning of surgery:  [phone]   Remember:  Do not eat or drink after midnight.      Your last dose of semaglutide should be on 7/11 or before.     Your last dose of plavix should be 04/07/2022.       Use your inhalers before you come and bring your rescue inhaler with you.    Take these medicines the morning of surgery with A SIP OF WATER            bisoprolol, buspar, cymbalta, prozac, vascepa, protonix, lyrica, ranexa, ultram (if needed).     Do not wear jewelry, make-up or nail polish.  Do not wear lotions, powders, or perfumes, or deodorant.  Do not shave 48 hours prior to surgery.  Men may shave face and neck.  Do not bring valuables to the hospital.  [facility] is not responsible for any belongings or valuables.  Contacts, dentures or bridgework may not be worn into surgery.  Leave your suitcase in the car.  After surgery it may be brought to your room.  For patients admitted to the hospital, discharge time will be determined by your treatment team.  Patients discharged the day of surgery will not be allowed to drive home and must have someone with them for 24 hours.    Special instructions:   DO NOT smoke tobacco or vape for 24 hours before your procedure.  Please read over the following fact sheets that you were given. Coughing and Deep Breathing, Surgical Site Infection Prevention, Anesthesia Post-op Instructions, and Care and Recovery After Surgery      Ureteral Stent Implantation, Care After This sheet gives you information about how to care for yourself after your procedure. Your health care provider may also give you more specific instructions. If you have problems or questions, contact your health care provider. What can I expect after the procedure? After  the procedure, it is common to have: Nausea. Mild pain when you urinate. You may feel this pain in your lower back or lower abdomen. The pain should stop within a few minutes after you urinate. This may last for up to 1 week. A small amount of blood in your urine for several days. Follow these instructions at home: Medicines Take over-the-counter and prescription medicines only as told by your health care provider. If you were prescribed an antibiotic medicine, take it as told by your health care provider. Do not stop taking the antibiotic even if you start to feel better. Do not drive for 24 hours if you were given a sedative during your procedure. Ask your health care provider if the medicine prescribed to you requires you to avoid driving or using heavy machinery. Activity Rest as told by your health care provider. Avoid sitting for a long time without moving. Get up to take short walks every 1-2 hours. This is important to improve blood flow and breathing. Ask for help if you feel weak or unsteady. Return to your normal activities as told by your health care provider. Ask your health care provider what activities are safe for you. General instructions  Watch for any blood in your urine. Call your health care provider if the amount of blood in your  urine increases. If you have a catheter: Follow instructions from your health care provider about taking care of your catheter and collection bag. Do not take baths, swim, or use a hot tub until your health care provider approves. Ask your health care provider if you may take showers. You may only be allowed to take sponge baths. Drink enough fluid to keep your urine pale yellow. Do not use any products that contain nicotine or tobacco, such as cigarettes, e-cigarettes, and chewing tobacco. These can delay healing after surgery. If you need help quitting, ask your health care provider. Keep all follow-up visits as told by your health care  provider. This is important. Contact a health care provider if: You have pain that gets worse or does not get better with medicine, especially pain when you urinate. You have difficulty urinating. You feel nauseous or you vomit repeatedly during a period of more than 2 days after the procedure. Get help right away if: Your urine is dark red or has blood clots in it. You are leaking urine (have incontinence). The end of the stent comes out of your urethra. You cannot urinate. You have sudden, sharp, or severe pain in your abdomen or lower back. You have a fever. You have swelling or pain in your legs. You have difficulty breathing. Summary After the procedure, it is common to have mild pain when you urinate that goes away within a few minutes after you urinate. This may last for up to 1 week. Watch for any blood in your urine. Call your health care provider if the amount of blood in your urine increases. Take over-the-counter and prescription medicines only as told by your health care provider. Drink enough fluid to keep your urine pale yellow. This information is not intended to replace advice given to you by your health care provider. Make sure you discuss any questions you have with your health care provider. Document Revised: 08/10/2021 Document Reviewed: 07/20/2021 Elsevier Patient Education  2022 [employer] General Anesthesia, Adult, Care After This sheet gives you information about how to care for yourself after your procedure. Your health care provider may also give you more specific instructions. If you have problems or questions, contact your health care provider. What can I expect after the procedure? After the procedure, the following side effects are common: Pain or discomfort at the IV site. Nausea. Vomiting. Sore throat. Trouble concentrating. Feeling cold or chills. Feeling weak or tired. Sleepiness and fatigue. Soreness and body aches. These side effects can  affect parts of the body that were not involved in surgery. Follow these instructions at home: For the time period you were told by your health care provider:  Rest. Do not participate in activities where you could fall or become injured. Do not drive or use machinery. Do not drink alcohol. Do not take sleeping pills or medicines that cause drowsiness. Do not make important decisions or sign legal documents. Do not take care of children on your own. Eating and drinking Follow any instructions from your health care provider about eating or drinking restrictions. When you feel hungry, start by eating small amounts of foods that are soft and easy to digest (bland), such as toast. Gradually return to your regular diet. Drink enough fluid to keep your urine pale yellow. If you vomit, rehydrate by drinking water, juice, or clear broth. General instructions If you have sleep apnea, surgery and certain medicines can increase your risk for breathing problems. Follow instructions from your health care  provider about wearing your sleep device: Anytime you are sleeping, including during daytime naps. While taking prescription pain medicines, sleeping medicines, or medicines that make you drowsy. Have a responsible adult stay with you for the time you are told. It is important to have someone help care for you until you are awake and alert. Return to your normal activities as told by your health care provider. Ask your health care provider what activities are safe for you. Take over-the-counter and prescription medicines only as told by your health care provider. If you smoke, do not smoke without supervision. Keep all follow-up visits as told by your health care provider. This is important. Contact a health care provider if: You have nausea or vomiting that does not get better with medicine. You cannot eat or drink without vomiting. You have pain that does not get better with medicine. You are  unable to pass urine. You develop a skin rash. You have a fever. You have redness around your IV site that gets worse. Get help right away if: You have difficulty breathing. You have chest pain. You have blood in your urine or stool, or you vomit blood. Summary After the procedure, it is common to have a sore throat or nausea. It is also common to feel tired. Have a responsible adult stay with you for the time you are told. It is important to have someone help care for you until you are awake and alert. When you feel hungry, start by eating small amounts of foods that are soft and easy to digest (bland), such as toast. Gradually return to your regular diet. Drink enough fluid to keep your urine pale yellow. Return to your normal activities as told by your health care provider. Ask your health care provider what activities are safe for you. This information is not intended to replace advice given to you by your health care provider. Make sure you discuss any questions you have with your health care provider. Document Revised: 05/29/2020 Document Reviewed: 12/27/2019 Elsevier Patient Education  2023 [employer].

## 2022-04-12 ENCOUNTER — Encounter (HOSPITAL_COMMUNITY)
Admission: RE | Admit: 2022-04-12 | Discharge: 2022-04-12 | Disposition: A | Discharge status: Home / Self Care | Payer: Medicaid Other | Source: Ambulatory Visit | Attending: Urology | Admitting: Urology

## 2022-04-12 ENCOUNTER — Encounter (HOSPITAL_COMMUNITY): Payer: Self-pay

## 2022-04-12 VITALS — BP 110/74 | HR 99 | Temp 97.8°F | Resp 18 | Ht 70.0 in | Wt 365.0 lb

## 2022-04-12 DIAGNOSIS — Z01812 Encounter for preprocedural laboratory examination: Secondary | ICD-10-CM | POA: Diagnosis not present

## 2022-04-12 DIAGNOSIS — Z01818 Encounter for other preprocedural examination: Secondary | ICD-10-CM

## 2022-04-12 DIAGNOSIS — E1142 Type 2 diabetes mellitus with diabetic polyneuropathy: Secondary | ICD-10-CM | POA: Insufficient documentation

## 2022-04-12 DIAGNOSIS — D649 Anemia, unspecified: Secondary | ICD-10-CM | POA: Insufficient documentation

## 2022-04-12 HISTORY — DX: Personal history of urinary calculi: Z87.442

## 2022-04-12 HISTORY — DX: Unspecified osteoarthritis, unspecified site: M19.90

## 2022-04-12 HISTORY — DX: Gastro-esophageal reflux disease without esophagitis: K21.9

## 2022-04-12 LAB — BASIC METABOLIC PANEL
Anion gap: 13 (ref 5–15)
BUN: 11 mg/dL (ref 6–20)
CO2: 16 mmol/L — ABNORMAL LOW (ref 22–32)
Calcium: 9.3 mg/dL (ref 8.9–10.3)
Chloride: 107 mmol/L (ref 98–111)
Creatinine, Ser: 0.84 mg/dL (ref 0.44–1.00)
GFR, Estimated: >60 mL/min (ref >60)
Glucose, Bld: 114 mg/dL — ABNORMAL HIGH (ref 70–99)
Potassium: 3.9 mmol/L (ref 3.5–5.1)
Sodium: 136 mmol/L (ref 135–145)

## 2022-04-12 LAB — CBC WITH DIFFERENTIAL/PLATELET
Abs Immature Granulocytes: 0.09 10*3/uL — ABNORMAL HIGH (ref 0.00–0.07)
Basophils Absolute: 0 10*3/uL (ref 0.0–0.1)
Basophils Relative: 0 %
Eosinophils Absolute: 0.1 10*3/uL (ref 0.0–0.5)
Eosinophils Relative: 1 %
HCT: 40.4 % (ref 36.0–46.0)
Hemoglobin: 14 g/dL (ref 12.0–15.0)
Immature Granulocytes: 1 %
Lymphocytes Relative: 21 %
Lymphs Abs: 2.7 10*3/uL (ref 0.7–4.0)
MCH: 32 pg (ref 26.0–34.0)
MCHC: 34.7 g/dL (ref 30.0–36.0)
MCV: 92.2 fL (ref 80.0–100.0)
Monocytes Absolute: 0.7 10*3/uL (ref 0.1–1.0)
Monocytes Relative: 5 %
Neutro Abs: 8.9 10*3/uL — ABNORMAL HIGH (ref 1.7–7.7)
Neutrophils Relative %: 72 %
Platelets: 337 10*3/uL (ref 150–400)
RBC: 4.38 MIL/uL (ref 3.87–5.11)
RDW: 13.9 % (ref 11.5–15.5)
WBC: 12.5 10*3/uL — ABNORMAL HIGH (ref 4.0–10.5)
nRBC: 0 % (ref 0.0–0.2)

## 2022-04-12 LAB — POCT PREGNANCY, URINE: Preg Test, Ur: NEGATIVE

## 2022-04-13 ENCOUNTER — Ambulatory Visit (HOSPITAL_COMMUNITY)
Admission: RE | Admit: 2022-04-13 | Discharge: 2022-04-13 | Disposition: A | Discharge status: Home / Self Care | Payer: Medicaid Other | Attending: Urology | Admitting: Urology

## 2022-04-13 ENCOUNTER — Encounter (HOSPITAL_COMMUNITY)
Admission: RE | Disposition: A | Discharge status: Home / Self Care | Payer: Self-pay | Source: Home / Self Care | Attending: Urology

## 2022-04-13 ENCOUNTER — Ambulatory Visit (HOSPITAL_COMMUNITY): Payer: Medicaid Other

## 2022-04-13 ENCOUNTER — Telehealth: Payer: Self-pay

## 2022-04-13 ENCOUNTER — Ambulatory Visit (HOSPITAL_COMMUNITY): Payer: Medicaid Other | Admitting: Anesthesiology

## 2022-04-13 ENCOUNTER — Encounter: Payer: Self-pay | Admitting: Physical Medicine and Rehabilitation

## 2022-04-13 ENCOUNTER — Other Ambulatory Visit: Payer: Self-pay

## 2022-04-13 ENCOUNTER — Ambulatory Visit (HOSPITAL_BASED_OUTPATIENT_CLINIC_OR_DEPARTMENT_OTHER): Payer: Medicaid Other | Admitting: Anesthesiology

## 2022-04-13 ENCOUNTER — Encounter (HOSPITAL_COMMUNITY): Payer: Self-pay | Admitting: Urology

## 2022-04-13 DIAGNOSIS — G43709 Chronic migraine without aura, not intractable, without status migrainosus: Secondary | ICD-10-CM

## 2022-04-13 DIAGNOSIS — G8929 Other chronic pain: Secondary | ICD-10-CM

## 2022-04-13 DIAGNOSIS — Z87442 Personal history of urinary calculi: Secondary | ICD-10-CM | POA: Diagnosis not present

## 2022-04-13 DIAGNOSIS — I2510 Atherosclerotic heart disease of native coronary artery without angina pectoris: Secondary | ICD-10-CM

## 2022-04-13 DIAGNOSIS — K219 Gastro-esophageal reflux disease without esophagitis: Secondary | ICD-10-CM | POA: Insufficient documentation

## 2022-04-13 DIAGNOSIS — M7918 Myalgia, other site: Secondary | ICD-10-CM

## 2022-04-13 DIAGNOSIS — Z7985 Long-term (current) use of injectable non-insulin antidiabetic drugs: Secondary | ICD-10-CM | POA: Insufficient documentation

## 2022-04-13 DIAGNOSIS — F419 Anxiety disorder, unspecified: Secondary | ICD-10-CM | POA: Diagnosis not present

## 2022-04-13 DIAGNOSIS — E1122 Type 2 diabetes mellitus with diabetic chronic kidney disease: Secondary | ICD-10-CM | POA: Insufficient documentation

## 2022-04-13 DIAGNOSIS — E119 Type 2 diabetes mellitus without complications: Secondary | ICD-10-CM

## 2022-04-13 DIAGNOSIS — N189 Chronic kidney disease, unspecified: Secondary | ICD-10-CM | POA: Diagnosis not present

## 2022-04-13 DIAGNOSIS — F17210 Nicotine dependence, cigarettes, uncomplicated: Secondary | ICD-10-CM

## 2022-04-13 DIAGNOSIS — R0609 Other forms of dyspnea: Secondary | ICD-10-CM

## 2022-04-13 DIAGNOSIS — M1990 Unspecified osteoarthritis, unspecified site: Secondary | ICD-10-CM | POA: Insufficient documentation

## 2022-04-13 DIAGNOSIS — R197 Diarrhea, unspecified: Secondary | ICD-10-CM

## 2022-04-13 DIAGNOSIS — Z6841 Body Mass Index (BMI) 40.0 and over, adult: Secondary | ICD-10-CM

## 2022-04-13 DIAGNOSIS — I5032 Chronic diastolic (congestive) heart failure: Secondary | ICD-10-CM

## 2022-04-13 DIAGNOSIS — Z8679 Personal history of other diseases of the circulatory system: Secondary | ICD-10-CM

## 2022-04-13 DIAGNOSIS — E282 Polycystic ovarian syndrome: Secondary | ICD-10-CM

## 2022-04-13 DIAGNOSIS — E785 Hyperlipidemia, unspecified: Secondary | ICD-10-CM

## 2022-04-13 DIAGNOSIS — L723 Sebaceous cyst: Secondary | ICD-10-CM

## 2022-04-13 DIAGNOSIS — I1310 Hypertensive heart and chronic kidney disease without heart failure, with stage 1 through stage 4 chronic kidney disease, or unspecified chronic kidney disease: Secondary | ICD-10-CM | POA: Diagnosis not present

## 2022-04-13 DIAGNOSIS — N200 Calculus of kidney: Secondary | ICD-10-CM

## 2022-04-13 DIAGNOSIS — Z7984 Long term (current) use of oral hypoglycemic drugs: Secondary | ICD-10-CM | POA: Diagnosis not present

## 2022-04-13 DIAGNOSIS — R200 Anesthesia of skin: Secondary | ICD-10-CM

## 2022-04-13 DIAGNOSIS — R110 Nausea: Secondary | ICD-10-CM

## 2022-04-13 DIAGNOSIS — R142 Eructation: Secondary | ICD-10-CM

## 2022-04-13 DIAGNOSIS — Z720 Tobacco use: Secondary | ICD-10-CM

## 2022-04-13 DIAGNOSIS — I10 Essential (primary) hypertension: Secondary | ICD-10-CM

## 2022-04-13 DIAGNOSIS — I209 Angina pectoris, unspecified: Secondary | ICD-10-CM

## 2022-04-13 DIAGNOSIS — R030 Elevated blood-pressure reading, without diagnosis of hypertension: Secondary | ICD-10-CM

## 2022-04-13 DIAGNOSIS — J449 Chronic obstructive pulmonary disease, unspecified: Secondary | ICD-10-CM

## 2022-04-13 DIAGNOSIS — F32A Depression, unspecified: Secondary | ICD-10-CM | POA: Insufficient documentation

## 2022-04-13 DIAGNOSIS — J4489 Other specified chronic obstructive pulmonary disease: Secondary | ICD-10-CM

## 2022-04-13 DIAGNOSIS — Z79899 Other long term (current) drug therapy: Secondary | ICD-10-CM | POA: Diagnosis not present

## 2022-04-13 DIAGNOSIS — M5440 Lumbago with sciatica, unspecified side: Secondary | ICD-10-CM

## 2022-04-13 DIAGNOSIS — I252 Old myocardial infarction: Secondary | ICD-10-CM | POA: Diagnosis not present

## 2022-04-13 DIAGNOSIS — M797 Fibromyalgia: Secondary | ICD-10-CM | POA: Diagnosis not present

## 2022-04-13 DIAGNOSIS — E1140 Type 2 diabetes mellitus with diabetic neuropathy, unspecified: Secondary | ICD-10-CM

## 2022-04-13 DIAGNOSIS — I25119 Atherosclerotic heart disease of native coronary artery with unspecified angina pectoris: Secondary | ICD-10-CM | POA: Insufficient documentation

## 2022-04-13 DIAGNOSIS — I110 Hypertensive heart disease with heart failure: Secondary | ICD-10-CM | POA: Diagnosis not present

## 2022-04-13 DIAGNOSIS — I214 Non-ST elevation (NSTEMI) myocardial infarction: Secondary | ICD-10-CM

## 2022-04-13 DIAGNOSIS — M7062 Trochanteric bursitis, left hip: Secondary | ICD-10-CM

## 2022-04-13 DIAGNOSIS — J45909 Unspecified asthma, uncomplicated: Secondary | ICD-10-CM | POA: Insufficient documentation

## 2022-04-13 DIAGNOSIS — I509 Heart failure, unspecified: Secondary | ICD-10-CM | POA: Insufficient documentation

## 2022-04-13 DIAGNOSIS — I130 Hypertensive heart and chronic kidney disease with heart failure and stage 1 through stage 4 chronic kidney disease, or unspecified chronic kidney disease: Secondary | ICD-10-CM | POA: Insufficient documentation

## 2022-04-13 HISTORY — PX: CYSTOSCOPY WITH RETROGRADE PYELOGRAM, URETEROSCOPY AND STENT PLACEMENT: SHX5789

## 2022-04-13 LAB — GLUCOSE, CAPILLARY: Glucose-Capillary: 113 mg/dL — ABNORMAL HIGH (ref 70–99)

## 2022-04-13 SURGERY — CYSTOURETEROSCOPY, WITH RETROGRADE PYELOGRAM AND STENT INSERTION
Anesthesia: General | Site: Ureter | Laterality: Left

## 2022-04-13 MED ORDER — PROPOFOL 10 MG/ML IV BOLUS
INTRAVENOUS | Status: DC | PRN
  Administered 2022-04-13: 250 mg via INTRAVENOUS

## 2022-04-13 MED ORDER — PHENYLEPHRINE 80 MCG/ML (10ML) SYRINGE FOR IV PUSH (FOR BLOOD PRESSURE SUPPORT)
PREFILLED_SYRINGE | INTRAVENOUS | Status: DC | PRN
  Administered 2022-04-13 (×2): 80 ug via INTRAVENOUS

## 2022-04-13 MED ORDER — OXYCODONE-ACETAMINOPHEN 5-325 MG PO TABS: 1.0000 | ORAL_TABLET | ORAL | 0 refills | Status: DC | PRN

## 2022-04-13 MED ORDER — FENTANYL CITRATE (PF) 100 MCG/2ML IJ SOLN
INTRAMUSCULAR | Status: DC | PRN
  Administered 2022-04-13: 100 ug via INTRAVENOUS

## 2022-04-13 MED ORDER — LACTATED RINGERS IV SOLN: INTRAVENOUS | Status: DC

## 2022-04-13 MED ORDER — PROPOFOL 10 MG/ML IV BOLUS
INTRAVENOUS | Status: AC
  Filled 2022-04-13: qty 20

## 2022-04-13 MED ORDER — ORAL CARE MOUTH RINSE
15.0000 mL | Freq: Once | OROMUCOSAL | Status: AC
Start: 2022-04-13 — End: 2022-04-13

## 2022-04-13 MED ORDER — SUGAMMADEX SODIUM 200 MG/2ML IV SOLN
INTRAVENOUS | Status: DC | PRN
  Administered 2022-04-13: 400 mg via INTRAVENOUS

## 2022-04-13 MED ORDER — MIDAZOLAM HCL 2 MG/2ML IJ SOLN
INTRAMUSCULAR | Status: DC | PRN
  Administered 2022-04-13: 1 mg via INTRAVENOUS

## 2022-04-13 MED ORDER — LIDOCAINE HCL (CARDIAC) PF 100 MG/5ML IV SOSY
PREFILLED_SYRINGE | INTRAVENOUS | Status: DC | PRN
  Administered 2022-04-13: 60 mg via INTRATRACHEAL

## 2022-04-13 MED ORDER — ONDANSETRON HCL 4 MG/2ML IJ SOLN: 4.0000 mg | Freq: Once | INTRAMUSCULAR | Status: DC | PRN

## 2022-04-13 MED ORDER — CEFAZOLIN IN SODIUM CHLORIDE 3-0.9 GM/100ML-% IV SOLN
3.0000 g | INTRAVENOUS | Status: AC
  Administered 2022-04-13: 3 g via INTRAVENOUS

## 2022-04-13 MED ORDER — CHLORHEXIDINE GLUCONATE 0.12 % MT SOLN
15.0000 mL | Freq: Once | OROMUCOSAL | Status: AC
  Administered 2022-04-13: 15 mL via OROMUCOSAL

## 2022-04-13 MED ORDER — WATER FOR IRRIGATION, STERILE IR SOLN
Status: DC | PRN
  Administered 2022-04-13: 250 mL

## 2022-04-13 MED ORDER — MEPERIDINE HCL 50 MG/ML IJ SOLN: 6.2500 mg | INTRAMUSCULAR | Status: DC | PRN

## 2022-04-13 MED ORDER — SUCCINYLCHOLINE CHLORIDE 200 MG/10ML IV SOSY
PREFILLED_SYRINGE | INTRAVENOUS | Status: AC
  Filled 2022-04-13: qty 10

## 2022-04-13 MED ORDER — DEXAMETHASONE SODIUM PHOSPHATE 10 MG/ML IJ SOLN
INTRAMUSCULAR | Status: DC | PRN
  Administered 2022-04-13: 5 mg via INTRAVENOUS

## 2022-04-13 MED ORDER — HYDROMORPHONE HCL 1 MG/ML IJ SOLN: 0.2500 mg | INTRAMUSCULAR | Status: DC | PRN

## 2022-04-13 MED ORDER — DIATRIZOATE MEGLUMINE 30 % UR SOLN
URETHRAL | Status: DC | PRN
  Administered 2022-04-13: 100 mL via URETHRAL

## 2022-04-13 MED ORDER — ONDANSETRON HCL 4 MG PO TABS: 4.0000 mg | ORAL_TABLET | Freq: Every day | ORAL | 1 refills | Status: DC | PRN

## 2022-04-13 MED ORDER — FENTANYL CITRATE (PF) 250 MCG/5ML IJ SOLN
INTRAMUSCULAR | Status: AC
  Filled 2022-04-13: qty 5

## 2022-04-13 MED ORDER — SUGAMMADEX SODIUM 500 MG/5ML IV SOLN
INTRAVENOUS | Status: AC
  Filled 2022-04-13: qty 5

## 2022-04-13 MED ORDER — CEFAZOLIN SODIUM-DEXTROSE 2-4 GM/100ML-% IV SOLN
INTRAVENOUS | Status: AC
  Filled 2022-04-13: qty 100

## 2022-04-13 MED ORDER — LIDOCAINE HCL (PF) 2 % IJ SOLN
INTRAMUSCULAR | Status: AC
  Filled 2022-04-13: qty 5

## 2022-04-13 MED ORDER — SUCCINYLCHOLINE CHLORIDE 200 MG/10ML IV SOSY
PREFILLED_SYRINGE | INTRAVENOUS | Status: DC | PRN
  Administered 2022-04-13: 200 mg via INTRAVENOUS

## 2022-04-13 MED ORDER — ROCURONIUM BROMIDE 10 MG/ML (PF) SYRINGE
PREFILLED_SYRINGE | INTRAVENOUS | Status: AC
  Filled 2022-04-13: qty 10

## 2022-04-13 MED ORDER — ROCURONIUM BROMIDE 10 MG/ML (PF) SYRINGE
PREFILLED_SYRINGE | INTRAVENOUS | Status: DC | PRN
  Administered 2022-04-13: 30 mg via INTRAVENOUS

## 2022-04-13 MED ORDER — SODIUM CHLORIDE 0.9 % IR SOLN
Status: DC | PRN
  Administered 2022-04-13: 3000 mL

## 2022-04-13 MED ORDER — DIATRIZOATE MEGLUMINE 30 % UR SOLN
URETHRAL | Status: AC
  Filled 2022-04-13: qty 100

## 2022-04-13 MED ORDER — CEFAZOLIN IN SODIUM CHLORIDE 3-0.9 GM/100ML-% IV SOLN
INTRAVENOUS | Status: AC
  Filled 2022-04-13: qty 100

## 2022-04-13 MED ORDER — PHENYLEPHRINE 80 MCG/ML (10ML) SYRINGE FOR IV PUSH (FOR BLOOD PRESSURE SUPPORT)
PREFILLED_SYRINGE | INTRAVENOUS | Status: AC
  Filled 2022-04-13: qty 10

## 2022-04-13 MED ORDER — DEXAMETHASONE SODIUM PHOSPHATE 10 MG/ML IJ SOLN
INTRAMUSCULAR | Status: AC
  Filled 2022-04-13: qty 1

## 2022-04-13 MED ORDER — MIDAZOLAM HCL 2 MG/2ML IJ SOLN
INTRAMUSCULAR | Status: AC
  Filled 2022-04-13: qty 2

## 2022-04-13 MED ORDER — ONDANSETRON HCL 4 MG/2ML IJ SOLN
INTRAMUSCULAR | Status: AC
  Filled 2022-04-13: qty 2

## 2022-04-13 MED ORDER — ONDANSETRON HCL 4 MG/2ML IJ SOLN
INTRAMUSCULAR | Status: DC | PRN
  Administered 2022-04-13: 4 mg via INTRAVENOUS

## 2022-04-13 SURGICAL SUPPLY — 23 items
BAG DRAIN URO TABLE W/ADPT NS (BAG) ×3 IMPLANT
BAG DRN 8 ADPR NS SKTRN CSTL (BAG) ×2
BAG HAMPER (MISCELLANEOUS) ×3 IMPLANT
CATH INTERMIT  6FR 70CM (CATHETERS) ×3 IMPLANT
CLOTH BEACON ORANGE TIMEOUT ST (SAFETY) ×3 IMPLANT
EXTRACTOR STONE NITINOL NGAGE (UROLOGICAL SUPPLIES) ×2 IMPLANT
GLOVE BIOGEL PI IND STRL 7.0 (GLOVE) ×4 IMPLANT
GLOVE BIOGEL PI INDICATOR 7.0 (GLOVE) ×2
GLOVE SURG SS PI 8.0 STRL IVOR (GLOVE) ×2 IMPLANT
GOWN STRL REUS W/TWL LRG LVL3 (GOWN DISPOSABLE) ×3 IMPLANT
GOWN STRL REUS W/TWL XL LVL3 (GOWN DISPOSABLE) ×3 IMPLANT
GUIDEWIRE STR DUAL SENSOR (WIRE) ×3 IMPLANT
GUIDEWIRE STR ZIPWIRE 035X150 (MISCELLANEOUS) ×3 IMPLANT
IV NS IRRIG 3000ML ARTHROMATIC (IV SOLUTION) ×6 IMPLANT
KIT TURNOVER CYSTO (KITS) ×3 IMPLANT
MANIFOLD NEPTUNE II (INSTRUMENTS) ×3 IMPLANT
PACK CYSTO (CUSTOM PROCEDURE TRAY) ×3 IMPLANT
PAD ARMBOARD 7.5X6 YLW CONV (MISCELLANEOUS) ×3 IMPLANT
SHEATH URETERAL 12FRX35CM (MISCELLANEOUS) ×2 IMPLANT
STENT URET 6FRX26 CONTOUR (STENTS) ×2 IMPLANT
SYR CONTROL 10ML LL (SYRINGE) ×3 IMPLANT
TOWEL OR 17X26 4PK STRL BLUE (TOWEL DISPOSABLE) ×3 IMPLANT
WATER STERILE IRR 500ML POUR (IV SOLUTION) ×3 IMPLANT

## 2022-04-13 NOTE — Telephone Encounter (Signed)
Patient's husband Ree Kida) called stating that Dr. Ronne Binning sent in Oxycodone for his wife, and her body can't tolerate I. He is asking if that could be switched to Hydrocodone.  PATIENT USES [facility]

## 2022-04-13 NOTE — Op Note (Signed)
.  Preoperative diagnosis: Left renal stone  Postoperative diagnosis: Same  Procedure: 1 cystoscopy 2. Left retrograde pyelography 3.  Intraoperative fluoroscopy, under one hour, with interpretation 4.  Left ureteroscopic stone manipulation with basket extraction 5.  Left 6 x 26 JJ stent placement  Attending: Wilkie Aye  Anesthesia: General  Estimated blood loss: None  Drains: Left 6 x 26 JJ ureteral stent with tether  Specimens: stone for analysis  Antibiotics: Ancef  Findings: left lower pole stone. No hydronephrosis. No masses/lesions in the bladder. Ureteral orifices in normal anatomic location.  Indications: Patient is a 41 year old female with a history of left renal stone and who has persistent left flank pain.  After discussing treatment options, she decided proceed with left ureteroscopic stone manipulation.  Procedure in detail: The patient was brought to the operating room and a brief timeout was done to ensure correct patient, correct procedure, correct site.  General anesthesia was administered patient was placed in dorsal lithotomy position.  Her genitalia was then prepped and draped in usual sterile fashion.  A rigid 22 French cystoscope was passed in the urethra and the bladder.  Bladder was inspected free masses or lesions.  the ureteral orifices were in the normal orthotopic locations.  a 6 french ureteral catheter was then instilled into the left ureteral orifice.  a gentle retrograde was obtained and findings noted above.  we then placed a zip wire through the ureteral catheter and advanced up to the renal pelvis.  we then removed the cystoscope and cannulated the left ureteral orifice with a semirigid ureteroscope.  No stone was found in the ureter. Once we reached the UPJ a sensor wire was advanced in to the renal pelvis. We then removed the ureteroscope and advanced am 12/14 x 38cm access sheath up to the renal pelvis. We then used the flexible ureteroscope to  perform nephroscopy. We encountered the stone in the lower pole which was removed with an NGage basket. once all stone was removed we then removed the access sheath under direct vision and noted no injury to the ureter. We then placed a 6 x 26 double-j ureteral stent over the original zip wire.  We then removed the wire and good coil was noted in the the renal pelvis under fluoroscopy and the bladder under direct vision. the bladder was then drained and this concluded the procedure which was well tolerated by patient.  Complications: None  Condition: Stable, extubated, transferred to PACU  Plan: Patient is to be discharged home as to follow-up in one week. She is to remove her stent in 72 hours

## 2022-04-13 NOTE — Anesthesia Procedure Notes (Signed)
Procedure Name: Intubation Date/Time: 04/13/2022 12:21 PM  Performed by: Lorin Glass, CRNAPre-anesthesia Checklist: Patient identified, Emergency Drugs available, Suction available and Patient being monitored Patient Re-evaluated:Patient Re-evaluated prior to induction Oxygen Delivery Method: Circle system utilized Preoxygenation: Pre-oxygenation with 100% oxygen Induction Type: IV induction Laryngoscope Size: Glidescope and 3 Grade View: Grade I Tube type: Oral Tube size: 7.5 mm Number of attempts: 1 Airway Equipment and Method: Stylet Placement Confirmation: ETT inserted through vocal cords under direct vision, positive ETCO2 and breath sounds checked- equal and bilateral Secured at: 21 cm Tube secured with: Tape Dental Injury: Teeth and Oropharynx as per pre-operative assessment

## 2022-04-13 NOTE — Anesthesia Postprocedure Evaluation (Signed)
Anesthesia Post Note  Patient: [name]  Procedure(s) Performed: CYSTOSCOPY WITH RETROGRADE PYELOGRAM, URETEROSCOPY AND STENT PLACEMENT, BASKET EXTRACTION (Left: Ureter)  Patient location during evaluation: Phase II Anesthesia Type: General Level of consciousness: awake and alert and oriented Pain management: pain level controlled Vital Signs Assessment: post-procedure vital signs reviewed and stable Respiratory status: spontaneous breathing, nonlabored ventilation and respiratory function stable Cardiovascular status: blood pressure returned to baseline and stable Postop Assessment: no apparent nausea or vomiting Anesthetic complications: no   No notable events documented.   Last Vitals:  Vitals:   04/13/22 1325 04/13/22 1351  BP:  106/65  Pulse:  87  Resp:  16  Temp:  36.7 C  SpO2: (!) 87% 100%    Last Pain:  Vitals:   04/13/22 1351  TempSrc: Oral  PainSc: 0-No pain                 Chakita Mcgraw C Magda Muise

## 2022-04-13 NOTE — Transfer of Care (Signed)
Immediate Anesthesia Transfer of Care Note  Patient: Carloyn Lahue Ulin  Procedure(s) Performed: CYSTOSCOPY WITH RETROGRADE PYELOGRAM, URETEROSCOPY AND STENT PLACEMENT, BASKET EXTRACTION (Left: Ureter)  Patient Location: PACU  Anesthesia Type:General  Level of Consciousness: awake  Airway & Oxygen Therapy: Patient Spontanous Breathing and Patient connected to face mask oxygen  Post-op Assessment: Report given to RN and Post -op Vital signs reviewed and stable  Post vital signs: Reviewed and stable  Last Vitals:  Vitals Value Taken Time  BP 80/65   Temp    Pulse 86 04/13/22 1314  Resp 16 04/13/22 1314  SpO2 94 % 04/13/22 1314  Vitals shown include unvalidated device data.  Last Pain:  Vitals:   04/13/22 1113  TempSrc: Oral  PainSc: 0-No pain      Patients Stated Pain Goal: 9 (04/13/22 1113)  Complications: No notable events documented.

## 2022-04-13 NOTE — Telephone Encounter (Signed)
Please see previous message

## 2022-04-13 NOTE — Anesthesia Preprocedure Evaluation (Signed)
Anesthesia Evaluation  Patient identified by MRN, date of birth, ID band Patient awake    Reviewed: Allergy & Precautions, NPO status , Patient's Chart, lab work & pertinent test results  Airway Mallampati: II  TM Distance: >3 FB Neck ROM: Full    Dental  (+) Dental Advisory Given, Missing, Chipped   Pulmonary shortness of breath, asthma , Current Smoker and Patient abstained from smoking.,    Pulmonary exam normal breath sounds clear to auscultation       Cardiovascular hypertension, Pt. on medications + angina + CAD, + Past MI, + Cardiac Stents, +CHF and + DOE  Normal cardiovascular exam Rhythm:Regular Rate:Normal     Neuro/Psych  Headaches, PSYCHIATRIC DISORDERS Anxiety Depression  Neuromuscular disease    GI/Hepatic Neg liver ROS, GERD  Medicated,  Endo/Other  diabetes, Well Controlled, Type 2, Oral Hypoglycemic Agents  Renal/GU Renal disease  negative genitourinary   Musculoskeletal  (+) Arthritis , Fibromyalgia -  Abdominal   Peds negative pediatric ROS (+)  Hematology  (+) Blood dyscrasia, anemia ,   Anesthesia Other Findings   Reproductive/Obstetrics negative OB ROS                             Anesthesia Physical Anesthesia Plan  ASA: 3  Anesthesia Plan: General   Post-op Pain Management: Dilaudid IV   Induction:   PONV Risk Score and Plan: 4 or greater and Ondansetron, Dexamethasone and Metaclopromide  Airway Management Planned: Oral ETT  Additional Equipment:   Intra-op Plan:   Post-operative Plan: Extubation in OR  Informed Consent: I have reviewed the patients History and Physical, chart, labs and discussed the procedure including the risks, benefits and alternatives for the proposed anesthesia with the patient or authorized representative who has indicated his/her understanding and acceptance.     Dental advisory given  Plan Discussed with: CRNA and  Surgeon  Anesthesia Plan Comments:         Anesthesia Quick Evaluation

## 2022-04-13 NOTE — Interval H&P Note (Signed)
History and Physical Interval Note:  04/13/2022 12:00 PM  [name]  has presented today for surgery, with the diagnosis of left renal calculus.  The various methods of treatment have been discussed with the patient and family. After consideration of risks, benefits and other options for treatment, the patient has consented to  Procedure(s) with comments: CYSTOSCOPY WITH RETROGRADE PYELOGRAM, URETEROSCOPY AND STENT PLACEMENT (Left) - Dr. requested time HOLMIUM LASER APPLICATION (Left) as a surgical intervention.  The patient's history has been reviewed, patient examined, no change in status, stable for surgery.  I have reviewed the patient's chart and labs.  Questions were answered to the patient's satisfaction.     Wilkie Aye

## 2022-04-14 ENCOUNTER — Encounter (HOSPITAL_COMMUNITY): Payer: Self-pay | Admitting: Urology

## 2022-04-14 MED ORDER — HYDROCODONE-ACETAMINOPHEN 5-325 MG PO TABS: 1.0000 | ORAL_TABLET | Freq: Four times a day (QID) | ORAL | 0 refills | Status: DC | PRN

## 2022-04-14 NOTE — Telephone Encounter (Signed)
Patient aware.

## 2022-04-19 LAB — CALCULI, WITH PHOTOGRAPH (CLINICAL LAB)
Calcium Oxalate Dihydrate: 90 %
Calcium Oxalate Monohydrate: 10 %
Weight Calculi: 10 mg

## 2022-04-21 ENCOUNTER — Ambulatory Visit: Payer: Medicaid Other | Admitting: Urology

## 2022-04-21 ENCOUNTER — Encounter: Payer: Self-pay | Admitting: Urology

## 2022-04-21 VITALS — BP 139/80 | HR 87

## 2022-04-21 DIAGNOSIS — N200 Calculus of kidney: Secondary | ICD-10-CM | POA: Diagnosis not present

## 2022-04-21 DIAGNOSIS — Z87442 Personal history of urinary calculi: Secondary | ICD-10-CM

## 2022-04-21 LAB — MICROSCOPIC EXAMINATION: Renal Epithel, UA: NONE SEEN /hpf

## 2022-04-21 LAB — URINALYSIS, ROUTINE W REFLEX MICROSCOPIC
Bilirubin, UA: NEGATIVE
Glucose, UA: NEGATIVE
Nitrite, UA: POSITIVE — AB
Specific Gravity, UA: 1.02 (ref 1.005–1.030)
Urobilinogen, Ur: 2 mg/dL — ABNORMAL HIGH (ref 0.2–1.0)
pH, UA: 6 (ref 5.0–7.5)

## 2022-04-21 NOTE — Progress Notes (Signed)
04/21/2022 11:03 AM   [name] [birth year] [id number]  Referring provider: Janora Norlander, DO [place],  [place] [address]  Followup nephrolithiasis   HPI: [name] is a 41yo here for followup after left ureteroscopic stone extraction. She removed her stent POD#2. Stone composition calcium oxalate. No flank pain. No worsening LUTS.    PMH: Past Medical History:  Diagnosis Date   Allergy    Anemia    Anginal pain ([place])    Anxiety    Arthritis    CHF (congestive heart failure) (HCC)    Chronic asthmatic bronchitis ([place]) 08/17/2021   Chronic kidney disease    history of kidney stones   Coronary artery disease 09/2011   a.s/p BMS to LAD and angioplasty of RI in 09/2011 b. patent stent by cath in 02/2012, 12/2013, and 02/2016 with most recent showing 30-40% dLAD and 30-40% RCA stenosis   Depression    Diabetes mellitus    Dyspnea    GERD (gastroesophageal reflux disease)    History of kidney stones    Hyperlipidemia    Hypertension    Midsternal chest pain 03/03/2012   Migraines    Morbid obesity ([place])    Myocardial infarct ([place]) 09/2011   Myocardial infarction ([place]) 02/2016   Neuromuscular disorder ([place])    Palpitations 02/17/2012   Ureteral stone with hydronephrosis    Urinary tract obstruction due to kidney stone 02/15/2017    Surgical History: Past Surgical History:  Procedure Laterality Date   BIOPSY  10/27/2020   Procedure: BIOPSY;  Surgeon: Daneil Dolin, MD;  [facility];  Service: Endoscopy;;   CARDIAC CATHETERIZATION  10/08/2011   LAD: 95% mid, Ramus: 95% ostial   CARPAL TUNNEL RELEASE     CESAREAN SECTION     CHOLECYSTECTOMY     CORONARY ANGIOPLASTY WITH STENT PLACEMENT  10/08/2011   LAD: BMS, Ramus: cutting balloon angioplasty   CORONARY STENT INTERVENTION N/A 01/14/2020   Procedure: CORONARY STENT INTERVENTION;  Surgeon: Leonie Man, MD;  Location: [facility];  Service: Cardiovascular;  Laterality:  N/A;   CYSTOSCOPY WITH RETROGRADE PYELOGRAM, URETEROSCOPY AND STENT PLACEMENT Left 02/16/2017   Procedure: CYSTOSCOPY WITH RETROGRADE PYELOGRAM, URETEROSCOPY AND STENT PLACEMENT;  Surgeon: Kathie Rhodes, MD;  Location: [facility];  Service: Urology;  Laterality: Left;   CYSTOSCOPY WITH RETROGRADE PYELOGRAM, URETEROSCOPY AND STENT PLACEMENT Left 03/28/2017   Procedure: LEFT STENT REMOVAL LEFT RETROGRADE PYELOGRAM LEFT URETEROSCOPY   LASER LITHOTRIPSY  AND  STENT REPLACEMENT;  Surgeon: Cleon Gustin, MD;  Location: [facility];  Service: Urology;  Laterality: Left;   CYSTOSCOPY WITH RETROGRADE PYELOGRAM, URETEROSCOPY AND STENT PLACEMENT Left 04/13/2022   Procedure: CYSTOSCOPY WITH RETROGRADE PYELOGRAM, URETEROSCOPY AND STENT PLACEMENT, BASKET EXTRACTION;  Surgeon: Cleon Gustin, MD;  Location: [facility];  Service: Urology;  Laterality: Left;   ESOPHAGOGASTRODUODENOSCOPY (EGD) WITH PROPOFOL N/A 10/27/2020   Rourk: mild erosive reflux esophagitis, h.pylori gastritis   LEFT HEART CATH AND CORONARY ANGIOGRAPHY N/A 08/15/2018   Procedure: LEFT HEART CATH AND CORONARY ANGIOGRAPHY;  Surgeon: Martinique, Peter M, MD;  Location: [facility];  Service: Cardiovascular;  Laterality: N/A;   LEFT HEART CATH AND CORONARY ANGIOGRAPHY N/A 01/14/2020   Procedure: LEFT HEART CATH AND CORONARY ANGIOGRAPHY;  Surgeon: Leonie Man, MD;  Location: [facility];  Service: Cardiovascular;  Laterality: N/A;   LEFT HEART CATHETERIZATION WITH CORONARY ANGIOGRAM N/A 10/08/2011   Procedure: LEFT HEART CATHETERIZATION WITH CORONARY ANGIOGRAM;  Surgeon: Peter M Martinique, MD;  Location: [facility];  Service: Cardiovascular;  Laterality: N/A;   LEFT HEART CATHETERIZATION WITH CORONARY ANGIOGRAM N/A 03/02/2012   Procedure: LEFT HEART CATHETERIZATION WITH CORONARY ANGIOGRAM;  Surgeon: Burnell Blanks, MD;  Location: [facility] CATH LAB;  Service: Cardiovascular;  Laterality: N/A;   LEFT HEART CATHETERIZATION WITH CORONARY  ANGIOGRAM N/A 01/04/2014   Procedure: LEFT HEART CATHETERIZATION WITH CORONARY ANGIOGRAM;  Surgeon: Troy Sine, MD;  Location: [facility] CATH LAB;  Service: Cardiovascular;  Laterality: N/A;   right hand pinky surgery      Home Medications:  Allergies as of 04/21/2022       Reactions   Bee Venom Anaphylaxis, Swelling   Latex Itching   Misc. Sulfonamide Containing Compounds Nausea And Vomiting   Sulfa Drugs Cross Reactors Nausea And Vomiting        Medication List        Accurate as of April 21, 2022 11:03 AM. If you have any questions, ask your nurse or doctor.          Accu-Chek Aviva Plus test strip Generic drug: glucose blood Test BS 4 times daily Dx E11.9   Accu-Chek Softclix Lancets lancets BOOD SUGAR FOUR TIMES DAILY Dx E11.9   albuterol 108 (90 Base) MCG/ACT inhaler Commonly known as: VENTOLIN HFA Inhale 2 puffs into the lungs every 4 (four) hours as needed for wheezing or shortness of breath.   aspirin 81 MG chewable tablet Chew 81 mg by mouth daily.   bisoprolol 5 MG tablet Commonly known as: ZEBETA One twice daily   blood glucose meter kit and supplies Dispense based on patient and insurance preference. Use up to four times daily as directed. (FOR ICD-10:  E11.22). Check BG daily   budesonide-formoterol 160-4.5 MCG/ACT inhaler Commonly known as: Symbicort Take 2 puffs first thing in am and then another 2 puffs about 12 hours later.   busPIRone 10 MG tablet Commonly known as: BUSPAR TAKE 1 TABLET BY MOUTH TWICE DAILY   clopidogrel 75 MG tablet Commonly known as: PLAVIX TAKE 1 TABLET BY MOUTH EVERY DAY   DULoxetine 30 MG capsule Commonly known as: Cymbalta Take 1 capsule (30 mg total) by mouth at bedtime. Take 1 tab nightly x 1 week, then 2 capsules/60 mg nightly- for nerve pain- take with reduced dose of Prozac What changed:  how much to take additional instructions   ezetimibe 10 MG tablet Commonly known as: ZETIA TAKE 1 TABLET BY MOUTH EVERY  DAY   fenofibrate 48 MG tablet Commonly known as: TRICOR TAKE 1 TABLET BY MOUTH DAILY   FLUoxetine 10 MG capsule Commonly known as: PROZAC Take 2 capsules (20 mg total) by mouth daily for 28 days, THEN 1 capsule (10 mg total) daily for 28 days. Then stop. Start taking on: March 16, 2022   HYDROcodone-acetaminophen 5-325 MG tablet Commonly known as: NORCO/VICODIN Take 1 tablet by mouth every 6 (six) hours as needed for moderate pain.   icosapent Ethyl 1 g capsule Commonly known as: Vascepa Take 2 capsules (2 g total) by mouth 2 (two) times daily.   losartan 25 MG tablet Commonly known as: COZAAR TAKE 1 TABLET BY MOUTH DAILY   medroxyPROGESTERone 5 MG tablet Commonly known as: PROVERA TAKE 1 TABLET BY MOUTH DAILY   metFORMIN 1000 MG tablet Commonly known as: GLUCOPHAGE Take 1 tablet (1,000 mg total) by mouth 2 (two) times daily.   nitrofurantoin (macrocrystal-monohydrate) 100 MG capsule Commonly known as: Macrobid Take 1 capsule (  100 mg total) by mouth 2 (two) times daily. 1 po BId   nitroGLYCERIN 0.4 MG SL tablet Commonly known as: NITROSTAT DISSOLVE 1 TABLET UNDER THE TONGUE EVERY 5 MINUTES AS NEEDED FOR CHEST PAIN. DO NOT EXCEED A TOTAL OF 3 DOSES IN 15 MINUTES.   ondansetron 4 MG tablet Commonly known as: Zofran Take 1 tablet (4 mg total) by mouth daily as needed for nausea or vomiting.   oxyCODONE-acetaminophen 5-325 MG tablet Commonly known as: Percocet Take 1 tablet by mouth every 4 (four) hours as needed for severe pain.   pantoprazole 40 MG tablet Commonly known as: PROTONIX TAKE 1 TABLET BY MOUTH DAILY   potassium chloride SA 20 MEQ tablet Commonly known as: KLOR-CON M Take 1 tablet (20 mEq total) by mouth daily as needed (when taking torsemide).   pregabalin 150 MG capsule Commonly known as: LYRICA TAKE ONE CAPSULE BY MOUTH TWICE DAILY   ranolazine 1000 MG SR tablet Commonly known as: RANEXA TAKE 1 TABLET BY MOUTH TWICE DAILY   Repatha SureClick  151 MG/ML Soaj Generic drug: Evolocumab INJECT 140 MG INTO THE SKIN EVERY 14 DAYS   rosuvastatin 40 MG tablet Commonly known as: CRESTOR TAKE 1 TABLET BY MOUTH DAILY   Semaglutide (1 MG/DOSE) 4 MG/3ML Sopn Inject 1 mg as directed once a week. To REPLACE 0.51m Ozempic   torsemide 20 MG tablet Commonly known as: DEMADEX TAKE 2 TABLETS BY MOUTH DAILY AS NEEDED FOR swelling   traMADol 50 MG tablet Commonly known as: ULTRAM Take 2 tablets (100 mg total) by mouth 2 (two) times daily.        Allergies:  Allergies  Allergen Reactions   Bee Venom Anaphylaxis and Swelling   Latex Itching   Misc. Sulfonamide Containing Compounds Nausea And Vomiting   Sulfa Drugs Cross Reactors Nausea And Vomiting    Family History: Family History  Problem Relation Age of Onset   Heart murmur Father    Diabetes Father    Hypertension Father    Hyperlipidemia Father    Cancer Father        throat   Diabetes Mother    Cancer Mother        breast.uterine, ovarian   Asthma Son    Appendicitis Son    Hernia Son    Mental illness Son        Bipolar, personality d/o   Stroke Maternal Grandmother    Cancer Maternal Grandmother        breast   Cancer Paternal Grandfather        lung   Colon cancer Neg Hx     Social History:  reports that she has been smoking cigarettes. She started smoking about 32 years ago. She has a 10.00 pack-year smoking history. She has never used smokeless tobacco. She reports that she does not currently use alcohol. She reports that she does not use drugs.  ROS: All other review of systems were reviewed and are negative except what is noted above in HPI  Physical Exam: BP 139/80   Pulse 87   Constitutional:  Alert and oriented, No acute distress. HEENT: [place] AT, moist mucus membranes.  Trachea midline, no masses. Cardiovascular: No clubbing, cyanosis, or edema. Respiratory: Normal respiratory effort, no increased work of breathing. GI: Abdomen is soft, nontender,  nondistended, no abdominal masses GU: No CVA tenderness.  Lymph: No cervical or inguinal lymphadenopathy. Skin: No rashes, bruises or suspicious lesions. Neurologic: Grossly intact, no focal deficits, moving all 4 extremities.  Psychiatric: Normal mood and affect.  Laboratory Data: Lab Results  Component Value Date   WBC 12.5 (H) 04/12/2022   HGB 14.0 04/12/2022   HCT 40.4 04/12/2022   MCV 92.2 04/12/2022   PLT 337 04/12/2022    Lab Results  Component Value Date   CREATININE 0.84 04/12/2022    No results found for: "PSA"  No results found for: "TESTOSTERONE"  Lab Results  Component Value Date   HGBA1C 7.3 (H) 03/16/2022    Urinalysis    Component Value Date/Time   COLORURINE YELLOW 01/27/2018 1523   APPEARANCEUR Turbid (A) 03/31/2022 1557   LABSPEC 1.015 01/27/2018 1523   PHURINE 5.0 01/27/2018 1523   GLUCOSEU Negative 03/31/2022 1557   HGBUR MODERATE (A) 01/27/2018 1523   BILIRUBINUR Positive (A) 03/31/2022 1557   KETONESUR NEGATIVE 01/27/2018 1523   PROTEINUR 3+ (A) 03/31/2022 1557   PROTEINUR NEGATIVE 01/27/2018 1523   NITRITE Negative 03/31/2022 1557   NITRITE POSITIVE (A) 01/27/2018 1523   LEUKOCYTESUR Negative 03/31/2022 1557    Lab Results  Component Value Date   LABMICR See below: 03/31/2022   WBCUA None seen 03/31/2022   LABEPIT 0-10 03/31/2022   MUCUS Present 03/31/2022   BACTERIA Few 03/31/2022    Pertinent Imaging:  Results for orders placed in visit on 03/23/22  DG Abd 1 View  Narrative CLINICAL DATA:  Hematuria.  History of kidney stone.  EXAM: ABDOMEN - 1 VIEW  COMPARISON:  March 28, 2017  FINDINGS: Both kidneys are partially obscured by bowel contents. Within this limitation, no renal stones are identified. No ureteral stones are identified. No cause for hematuria noted.  IMPRESSION: No renal or ureteral stones identified. No cause for hematuria identified.   Electronically Signed By: Dorise Bullion III M.D. On:  03/23/2022 19:18  No results found for this or any previous visit.  No results found for this or any previous visit.  No results found for this or any previous visit.  Results for orders placed in visit on 03/23/22  US Renal  Narrative CLINICAL DATA:  Hematuria  EXAM: RENAL / URINARY TRACT ULTRASOUND COMPLETE  COMPARISON:  Ultrasound 05/16/2017, CT 05/18/2020  FINDINGS: Right Kidney:  Renal measurements: 13.4 x 8.5 x 8.8 cm = volume: 544 mL. Echogenicity within normal limits. No mass, shadowing stone, or hydronephrosis visualized.  Left Kidney:  Renal measurements: 15.4 x 6.3 x 7.0 = volume: 359 mL. Echogenicity within normal limits. Multifocal areas of cortical scarring. 7 mm echogenic, shadowing stone within the midpole of the left kidney. No mass or hydronephrosis visualized.  Bladder:  Appears normal for degree of bladder distention.  Other:  None.  IMPRESSION: 1. Nonobstructing 7 mm left renal calculus. 2. Unremarkable right kidney.   Electronically Signed By: Davina Poke D.O. On: 03/24/2022 11:51  No results found for this or any previous visit.  No results found for this or any previous visit.  No results found for this or any previous visit.   Assessment & Plan:    1. Kidney stones -We will proceed with metabolic evaluation due to history of recurrent CaOx calculi.  - Urinalysis, Routine w reflex microscopic   No follow-ups on file.  Nicolette Bang, MD  [facility] [place]

## 2022-04-21 NOTE — Patient Instructions (Signed)
Dietary Guidelines to Help Prevent Kidney Stones Kidney stones are deposits of minerals and salts that form inside your kidneys. Your risk of developing kidney stones may be greater depending on your diet, your lifestyle, the medicines you take, and whether you have certain medical conditions. Most people can lower their chances of developing kidney stones by following the instructions below. Your dietitian may give you more specific instructions depending on your overall health and the type of kidney stones you tend to develop. What are tips for following this plan? Reading food labels  Choose foods with "no salt added" or "low-salt" labels. Limit your salt (sodium) intake to less than 1,500 mg a day. Choose foods with calcium for each meal and snack. Try to eat about 300 mg of calcium at each meal. Foods that contain 200-500 mg of calcium a serving include: 8 oz (237 mL) of milk, calcium-fortifiednon-dairy milk, and calcium-fortifiedfruit juice. Calcium-fortified means that calcium has been added to these drinks. 8 oz (237 mL) of kefir, yogurt, and soy yogurt. 4 oz (114 g) of tofu. 1 oz (28 g) of cheese. 1 cup (150 g) of dried figs. 1 cup (91 g) of cooked broccoli. One 3 oz (85 g) can of sardines or mackerel. Most people need 1,000-1,500 mg of calcium a day. Talk to your dietitian about how much calcium is recommended for you. Shopping Buy plenty of fresh fruits and vegetables. Most people do not need to avoid fruits and vegetables, even if these foods contain nutrients that may contribute to kidney stones. When shopping for convenience foods, choose: Whole pieces of fruit. Pre-made salads with dressing on the side. Low-fat fruit and yogurt smoothies. Avoid buying frozen meals or prepared deli foods. These can be high in sodium. Look for foods with live cultures, such as yogurt and kefir. Choose high-fiber grains, such as whole-wheat breads, oat bran, and wheat cereals. Cooking Do not add  salt to food when cooking. Place a salt shaker on the table and allow each person to add his or her own salt to taste. Use vegetable protein, such as beans, textured vegetable protein (TVP), or tofu, instead of meat in pasta, casseroles, and soups. Meal planning Eat less salt, if told by your dietitian. To do this: Avoid eating processed or pre-made food. Avoid eating fast food. Eat less animal protein, including cheese, meat, poultry, or fish, if told by your dietitian. To do this: Limit the number of times you have meat, poultry, fish, or cheese each week. Eat a diet free of meat at least 2 days a week. Eat only one serving each day of meat, poultry, fish, or seafood. When you prepare animal protein, cut pieces into small portion sizes. For most meat and fish, one serving is about the size of the palm of your hand. Eat at least five servings of fresh fruits and vegetables each day. To do this: Keep fruits and vegetables on hand for snacks. Eat one piece of fruit or a handful of berries with breakfast. Have a salad and fruit at lunch. Have two kinds of vegetables at dinner. Limit foods that are high in a substance called oxalate. These include: Spinach (cooked), rhubarb, beets, sweet potatoes, and Swiss chard. Peanuts. Potato chips, french fries, and baked potatoes with skin on. Nuts and nut products. Chocolate. If you regularly take a diuretic medicine, make sure to eat at least 1 or 2 servings of fruits or vegetables that are high in potassium each day. These include: Avocado. Banana. Orange, prune,   carrot, or tomato juice. Baked potato. Cabbage. Beans and split peas. Lifestyle  Drink enough fluid to keep your urine pale yellow. This is the most important thing you can do. Spread your fluid intake throughout the day. If you drink alcohol: Limit how much you use to: 0-1 drink a day for women who are not pregnant. 0-2 drinks a day for men. Be aware of how much alcohol is in your  drink. In the [place] one drink equals one 12 oz bottle of beer (355 mL), one 5 oz glass of wine (148 mL), or one 1 oz glass of hard liquor (44 mL). Lose weight if told by your health care provider. Work with your dietitian to find an eating plan and weight loss strategies that work best for you. General information Talk to your health care provider and dietitian about taking daily supplements. You may be told the following depending on your health and the cause of your kidney stones: Not to take supplements with vitamin C. To take a calcium supplement. To take a daily probiotic supplement. To take other supplements such as magnesium, fish oil, or vitamin B6. Take over-the-counter and prescription medicines only as told by your health care provider. These include supplements. What foods should I limit? Limit your intake of the following foods, or eat them as told by your dietitian. Vegetables Spinach. Rhubarb. Beets. Canned vegetables. Pickles. Olives. Baked potatoes with skin. Grains Wheat bran. Baked goods. Salted crackers. Cereals high in sugar. Meats and other proteins Nuts. Nut butters. Large portions of meat, poultry, or fish. Salted, precooked, or cured meats, such as sausages, meat loaves, and hot dogs. Dairy Cheese. Beverages Regular soft drinks. Regular vegetable juice. Seasonings and condiments Seasoning blends with salt. Salad dressings. Soy sauce. Ketchup. Barbecue sauce. Other foods Canned soups. Canned pasta sauce. Casseroles. Pizza. Lasagna. Frozen meals. Potato chips. French fries. The items listed above may not be a complete list of foods and beverages you should limit. Contact a dietitian for more information. What foods should I avoid? Talk to your dietitian about specific foods you should avoid based on the type of kidney stones you have and your overall health. Fruits Grapefruit. The item listed above may not be a complete list of foods and beverages you should  avoid. Contact a dietitian for more information. Summary Kidney stones are deposits of minerals and salts that form inside your kidneys. You can lower your risk of kidney stones by making changes to your diet. The most important thing you can do is drink enough fluid. Drink enough fluid to keep your urine pale yellow. Talk to your dietitian about how much calcium you should have each day, and eat less salt and animal protein as told by your dietitian. This information is not intended to replace advice given to you by your health care provider. Make sure you discuss any questions you have with your health care provider. Document Revised: 05/25/2021 Document Reviewed: 05/25/2021 Elsevier Patient Education  2023 [employer]  

## 2022-04-21 NOTE — Addendum Note (Signed)
Addended by: Grier Rocher on: 04/21/2022 02:46 PM   Modules accepted: Orders

## 2022-04-22 LAB — URINALYSIS, ROUTINE W REFLEX MICROSCOPIC

## 2022-04-22 LAB — PTH, INTACT AND CALCIUM: PTH: 7 pg/mL — ABNORMAL LOW (ref 15–65)

## 2022-04-22 LAB — BASIC METABOLIC PANEL
BUN/Creatinine Ratio: 12 (ref 9–23)
BUN: 8 mg/dL (ref 6–24)
CO2: 22 mmol/L (ref 20–29)
Calcium: 9.8 mg/dL (ref 8.7–10.2)
Chloride: 102 mmol/L (ref 96–106)
Creatinine, Ser: 0.68 mg/dL (ref 0.57–1.00)
Glucose: 121 mg/dL — ABNORMAL HIGH (ref 70–99)
Potassium: 3.4 mmol/L — ABNORMAL LOW (ref 3.5–5.2)
Sodium: 139 mmol/L (ref 134–144)
eGFR: 111 mL/min/{1.73_m2} (ref >59)

## 2022-04-22 LAB — URIC ACID: Uric Acid: 3.2 mg/dL (ref 2.6–6.2)

## 2022-05-05 ENCOUNTER — Other Ambulatory Visit: Payer: Self-pay | Admitting: Family Medicine

## 2022-05-06 ENCOUNTER — Encounter: Payer: Self-pay | Admitting: Family Medicine

## 2022-05-14 ENCOUNTER — Telehealth: Payer: Self-pay | Admitting: Family Medicine

## 2022-05-14 MED ORDER — ACCU-CHEK SOFTCLIX LANCETS MISC: 3 refills | Status: DC

## 2022-05-14 MED ORDER — ACCU-CHEK GUIDE VI STRP: ORAL_STRIP | 3 refills | Status: DC

## 2022-05-14 MED ORDER — ACCU-CHEK GUIDE W/DEVICE KIT: PACK | 0 refills | Status: AC

## 2022-05-14 NOTE — Telephone Encounter (Signed)
Pt aware new meter, test strips & lancets sent to pharmacy 

## 2022-05-14 NOTE — Telephone Encounter (Signed)
  Prescription Request  05/14/2022  Is this a "Controlled Substance" medicine? no  Have you seen your PCP in the last 2 weeks? no  If YES, route message to pool  -  If NO, patient needs to be scheduled for appointment.  What is the name of the medication or equipment? Blood glucose meter kit and supples. Patient's broke and can't check her sugars.  Have you contacted your pharmacy to request a refill? no   Which pharmacy would you like this sent to? [facility]   Patient notified that their request is being sent to the clinical staff for review and that they should receive a response within 2 business days.

## 2022-05-26 ENCOUNTER — Encounter: Payer: Self-pay | Admitting: Physical Medicine and Rehabilitation

## 2022-05-26 ENCOUNTER — Encounter
Payer: Medicaid Other | Attending: Physical Medicine and Rehabilitation | Admitting: Physical Medicine and Rehabilitation

## 2022-05-26 ENCOUNTER — Telehealth: Payer: Self-pay

## 2022-05-26 VITALS — BP 121/80 | HR 89 | Temp 98.5°F | Ht 70.0 in | Wt 358.4 lb

## 2022-05-26 DIAGNOSIS — M797 Fibromyalgia: Secondary | ICD-10-CM | POA: Insufficient documentation

## 2022-05-26 DIAGNOSIS — M7918 Myalgia, other site: Secondary | ICD-10-CM | POA: Insufficient documentation

## 2022-05-26 MED ORDER — FLUOXETINE HCL 20 MG PO CAPS: 20.0000 mg | ORAL_CAPSULE | Freq: Every day | ORAL | 1 refills | Status: DC

## 2022-05-26 MED ORDER — LIDOCAINE HCL 1 % IJ SOLN: 6.0000 mL | Freq: Once | INTRAMUSCULAR | Status: DC

## 2022-05-26 MED ORDER — DULOXETINE HCL 60 MG PO CPEP: 60.0000 mg | ORAL_CAPSULE | Freq: Every day | ORAL | 5 refills | Status: DC

## 2022-05-26 NOTE — Patient Instructions (Signed)
Plan Will restart Prozac 20 mg daily- goal to try and keep 30 mg or below- if possible- advised pt about serotonin syndrome- and risks of that. Also advised on Symptoms. Sweating badly, heart racing, etc.    2. Wrote letter that pt would meet criteria for disability due to needing help with Adls and walking.      3. Con't tramadol- take TYLENOL along with tramadol- that should help length the amount of time it lasts.  It helps ~ 80% of time.    4. Con't Cymbalta/Duloxetine 60 mg nightly- change to 60 mg capsules     5. Con't Lyrica- doesn't need refills     6. Patient here for trigger point injections for  Consent done and on chart.   Cleaned areas with alcohol and injected using a 27 gauge 1.5 inch needle   Injected 3-cc- no waste on that syringe, but had ot waste 2nd syringe of 3cc Using 1% Lidocaine with no EPI   Upper traps B/L  Levators- B/L  Posterior scalenes Middle scalenes- B/L  Splenius Capitus Pectoralis Major Rhomboids- B/L  Infraspinatus Teres Major/minor Thoracic paraspinals Lumbar paraspinals Other injections-      Patient's level of pain prior was 8/10 Current level of pain after injections is 5/10-    There was no bleeding or complications.   Patient was advised to drink a lot of water on day after injections to flush system Will have increased soreness for 12-48 hours after injections.  Can use Lidocaine patches the day AFTER injections Can use theracane on day of injections in places didn't inject Can use heating pad 4-6 hours AFTER injections    7. F/u in 2 months- Trp injections- and f/u on FMS/myofascial pain.

## 2022-05-26 NOTE — Telephone Encounter (Signed)
Brandy with [employer] called. She has received the letter stating Mrs. Kruzel's condition. And the need for her husband to be the caregiver. However more details are needed.    Is the disability permanent or temporary?  How long will she need care from her spouse?    Also can you put the details in writing?   (Brandy's call back phone [phone], ext [phone]).
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
## 2022-05-27 NOTE — Telephone Encounter (Signed)
Dr. Dahlia Client reply let on Brandy's voicemail.

## 2022-06-02 ENCOUNTER — Ambulatory Visit: Payer: Medicaid Other | Admitting: Urology

## 2022-06-02 DIAGNOSIS — N200 Calculus of kidney: Secondary | ICD-10-CM

## 2022-06-07 ENCOUNTER — Encounter: Payer: Self-pay | Admitting: Family Medicine

## 2022-06-16 ENCOUNTER — Ambulatory Visit: Payer: Medicaid Other | Admitting: Family Medicine

## 2022-07-01 ENCOUNTER — Other Ambulatory Visit: Payer: Self-pay | Admitting: Family Medicine

## 2022-07-01 DIAGNOSIS — F418 Other specified anxiety disorders: Secondary | ICD-10-CM

## 2022-07-01 DIAGNOSIS — E1169 Type 2 diabetes mellitus with other specified complication: Secondary | ICD-10-CM

## 2022-07-01 DIAGNOSIS — F411 Generalized anxiety disorder: Secondary | ICD-10-CM

## 2022-07-05 ENCOUNTER — Encounter: Payer: Self-pay | Admitting: Family Medicine

## 2022-07-15 ENCOUNTER — Encounter: Payer: Self-pay | Admitting: Family Medicine

## 2022-07-19 ENCOUNTER — Ambulatory Visit (INDEPENDENT_AMBULATORY_CARE_PROVIDER_SITE_OTHER): Payer: Self-pay | Admitting: Family Medicine

## 2022-07-19 ENCOUNTER — Encounter: Payer: Self-pay | Admitting: Family Medicine

## 2022-07-19 VITALS — BP 133/78 | HR 83 | Temp 97.2°F | Ht 70.0 in | Wt 361.4 lb

## 2022-07-19 DIAGNOSIS — I152 Hypertension secondary to endocrine disorders: Secondary | ICD-10-CM

## 2022-07-19 DIAGNOSIS — E785 Hyperlipidemia, unspecified: Secondary | ICD-10-CM

## 2022-07-19 DIAGNOSIS — E1159 Type 2 diabetes mellitus with other circulatory complications: Secondary | ICD-10-CM

## 2022-07-19 DIAGNOSIS — E1169 Type 2 diabetes mellitus with other specified complication: Secondary | ICD-10-CM

## 2022-07-19 DIAGNOSIS — F418 Other specified anxiety disorders: Secondary | ICD-10-CM

## 2022-07-19 LAB — BAYER DCA HB A1C WAIVED: HB A1C (BAYER DCA - WAIVED): 6.1 % — ABNORMAL HIGH (ref 4.8–5.6)

## 2022-07-19 NOTE — Patient Instructions (Signed)
Call anytime for Ozempic and Repatha samples.  Will see if [facility] can get some Repatha and 1mg  of the Ozempic for you.

## 2022-07-19 NOTE — Progress Notes (Addendum)
Subjective: CC:Dm PCP: Janora Norlander, DO WVP:[id number] [name] is a 41 y.o. female presenting to clinic today for:  1. Type 2 Diabetes with hypertension, hyperlipidemia:  [name] is unable to continue her Ozempic due to a lapse in her health insurance. She reports compliance with the rest of her medications.   2. Hypertension Reports blood pressures at home in the 130s/80s.   3. Anxiety She notes that stress continues to be high--recently moved out of her house, lost health insurance due to increased assets. Anxiety is stable on fluoxetine 20 mg. Reports coping behaviors such as meditation and stretching.  4.  Fibromyalgia/chronic pain Patient is under the care of of a specialist for this. She reports no changes to her fibromyalgia pain.  Last eye exam: Needs Last foot exam: needs Last A1c:  Lab Results  Component Value Date   HGBA1C 7.3 (H) 03/16/2022   Nephropathy screen indicated?: on ARB Last flu, zoster and/or pneumovax:  Immunization History  Administered Date(s) Administered   Influenza Split 10/10/2011   Influenza,inj,Quad PF,6+ Mos 10/28/2017, 06/26/2018, 06/05/2019, 08/19/2020, 06/18/2021   Influenza-Unspecified 06/26/2018, 06/05/2019   PFIZER(Purple Top)SARS-COV-2 Vaccination 03/27/2020   Pneumococcal Conjugate-13 10/28/2017   Pneumococcal Polysaccharide-23 10/10/2011   Tdap 01/25/2018    ROS: No chest pain or shortness of breath reported.  ROS: Per HPI  Allergies  Allergen Reactions   Bee Venom Anaphylaxis and Swelling   Latex Itching   Misc. Sulfonamide Containing Compounds Nausea And Vomiting   Sulfa Drugs Cross Reactors Nausea And Vomiting   Past Medical History:  Diagnosis Date   Allergy    Anemia    Anginal pain (HCC)    Anxiety    Arthritis    CHF (congestive heart failure) (HCC)    Chronic asthmatic bronchitis 08/17/2021   Chronic kidney disease    history of kidney stones   Coronary artery disease 09/2011   a.s/p BMS to LAD  and angioplasty of RI in 09/2011 b. patent stent by cath in 02/2012, 12/2013, and 02/2016 with most recent showing 30-40% dLAD and 30-40% RCA stenosis   Depression    Diabetes mellitus    Dyspnea    GERD (gastroesophageal reflux disease)    History of kidney stones    Hyperlipidemia    Hypertension    Midsternal chest pain 03/03/2012   Migraines    Morbid obesity ([place])    Myocardial infarct ([place]) 09/2011   Myocardial infarction ([place]) 02/2016   Neuromuscular disorder ([place])    Palpitations 02/17/2012   Ureteral stone with hydronephrosis    Urinary tract obstruction due to kidney stone 02/15/2017    Current Outpatient Medications:    albuterol (VENTOLIN HFA) 108 (90 Base) MCG/ACT inhaler, Inhale 2 puffs into the lungs every 4 (four) hours as needed for wheezing or shortness of breath., Disp: 8 g, Rfl: 5   aspirin 81 MG chewable tablet, Chew 81 mg by mouth daily., Disp: , Rfl:    bisoprolol (ZEBETA) 5 MG tablet, One twice daily, Disp: 60 tablet, Rfl: 11   blood glucose meter kit and supplies, Dispense based on patient and insurance preference. Use up to four times daily as directed. (FOR ICD-10:  E11.22). Check BG daily, Disp: 1 each, Rfl: 0   Blood Glucose Monitoring Suppl (ACCU-CHEK GUIDE) w/Device KIT, Test BS 4 times daily Dx E11.9, Disp: 1 kit, Rfl: 0   budesonide-formoterol (SYMBICORT) 160-4.5 MCG/ACT inhaler, Take 2 puffs first thing in am and then another 2 puffs about 12 hours later.,  Disp: 1 each, Rfl: 11   busPIRone (BUSPAR) 10 MG tablet, TAKE 1 TABLET BY MOUTH TWICE DAILY, Disp: 60 tablet, Rfl: 0   clopidogrel (PLAVIX) 75 MG tablet, TAKE 1 TABLET BY MOUTH EVERY DAY, Disp: 90 tablet, Rfl: 3   DULoxetine (CYMBALTA) 60 MG capsule, Take 1 capsule (60 mg total) by mouth at bedtime., Disp: 30 capsule, Rfl: 5   ezetimibe (ZETIA) 10 MG tablet, TAKE 1 TABLET BY MOUTH EVERY DAY, Disp: 90 tablet, Rfl: 2   fenofibrate (TRICOR) 48 MG tablet, TAKE 1 TABLET BY MOUTH DAILY, Disp: 30 tablet,  Rfl: 0   FLUoxetine (PROZAC) 20 MG capsule, Take 1 capsule (20 mg total) by mouth daily. Give with cymbalta- at this dose- due to nerve pain AND anxiety/depression, Disp: 90 capsule, Rfl: 1   glucose blood (ACCU-CHEK GUIDE) test strip, Test BS 4 times daily Dx E11.9, Disp: 400 each, Rfl: 3   icosapent Ethyl (VASCEPA) 1 g capsule, Take 2 capsules (2 g total) by mouth 2 (two) times daily., Disp: , Rfl:    losartan (COZAAR) 25 MG tablet, TAKE 1 TABLET BY MOUTH DAILY, Disp: 90 tablet, Rfl: 2   medroxyPROGESTERone (PROVERA) 5 MG tablet, TAKE 1 TABLET BY MOUTH DAILY, Disp: 90 tablet, Rfl: 2   metFORMIN (GLUCOPHAGE) 1000 MG tablet, Take 1 tablet (1,000 mg total) by mouth 2 (two) times daily., Disp: 180 tablet, Rfl: 3   nitroGLYCERIN (NITROSTAT) 0.4 MG SL tablet, DISSOLVE 1 TABLET UNDER THE TONGUE EVERY 5 MINUTES AS NEEDED FOR CHEST PAIN. DO NOT EXCEED A TOTAL OF 3 DOSES IN 15 MINUTES., Disp: 25 tablet, Rfl: 3   ondansetron (ZOFRAN) 4 MG tablet, Take 1 tablet (4 mg total) by mouth daily as needed for nausea or vomiting., Disp: 30 tablet, Rfl: 1   pantoprazole (PROTONIX) 40 MG tablet, TAKE 1 TABLET BY MOUTH DAILY, Disp: 30 tablet, Rfl: 1   potassium chloride SA (KLOR-CON M) 20 MEQ tablet, Take 1 tablet (20 mEq total) by mouth daily as needed (when taking torsemide)., Disp: 90 tablet, Rfl: 1   pregabalin (LYRICA) 150 MG capsule, TAKE ONE CAPSULE BY MOUTH TWICE DAILY, Disp: 60 capsule, Rfl: 5   ranolazine (RANEXA) 1000 MG SR tablet, TAKE 1 TABLET BY MOUTH TWICE DAILY, Disp: 180 tablet, Rfl: 3   REPATHA SURECLICK 784 MG/ML SOAJ, INJECT 140 MG INTO THE SKIN EVERY 14 DAYS, Disp: 2 mL, Rfl: 6   rosuvastatin (CRESTOR) 40 MG tablet, TAKE 1 TABLET BY MOUTH DAILY, Disp: 30 tablet, Rfl: 6   Semaglutide, 1 MG/DOSE, 4 MG/3ML SOPN, Inject 1 mg as directed once a week. To REPLACE 0.54m Ozempic, Disp: 9 mL, Rfl: 3   torsemide (DEMADEX) 20 MG tablet, TAKE 2 TABLETS BY MOUTH DAILY AS NEEDED FOR swelling, Disp: 180 tablet, Rfl:  3   traMADol (ULTRAM) 50 MG tablet, Take 2 tablets (100 mg total) by mouth 2 (two) times daily., Disp: 120 tablet, Rfl: 5   Accu-Chek Softclix Lancets lancets, CHECK BOOD SUGAR FOUR TIMES DAILY Dx E11.9 (Patient not taking: Reported on 05/26/2022), Disp: 400 each, Rfl: 3  Current Facility-Administered Medications:    lidocaine (XYLOCAINE) 1 % (with pres) injection 6 mL, 6 mL, Other, Once, Lovorn, Megan, MD Social History   Socioeconomic History   Marital status: Married    Spouse name: JBarnabas Lister  Number of children: 2   Years of education: 10   Highest education level: Not on file  Occupational History   Not on file  Tobacco Use  Smoking status: Some Days    Packs/day: 0.50    Years: 20.00    Total pack years: 10.00    Types: Cigarettes    Start date: 03/18/1990   Smokeless tobacco: Never   Tobacco comments:    Vapes currently   Vaping Use   Vaping Use: Some days   Substances: Nicotine  Substance and Sexual Activity   Alcohol use: Not Currently    Alcohol/week: 0.0 standard drinks of alcohol    Comment: maybe once a year   Drug use: No   Sexual activity: Yes    Birth control/protection: None    Comment: PCOS  Other Topics Concern   Not on file  Social History Narrative   Unemployed   Applied for disability   Leisure "take care of house and kids"   Walks when able   Social Determinants of Health   Financial Resource Strain: Low Risk  (10/28/2017)   Overall Financial Resource Strain (CARDIA)    Difficulty of Paying Living Expenses: Not hard at all  Food Insecurity: No Food Insecurity (10/28/2017)   Hunger Vital Sign    Worried About Running Out of Food in the Last Year: Never true    [place] in the Last Year: Never true  Transportation Needs: No Transportation Needs (10/28/2017)   PRAPARE - Hydrologist (Medical): No    Lack of Transportation (Non-Medical): No  Physical Activity: Inactive (10/28/2017)   Exercise Vital Sign    Days of  Exercise per Week: 0 days    Minutes of Exercise per Session: 0 min  Stress: Stress Concern Present (10/28/2017)   [place]    Feeling of Stress : To some extent  Social Connections: Somewhat Isolated (10/28/2017)   Social Connection and Isolation Panel [NHANES]    Frequency of Communication with Friends and Family: More than three times a week    Frequency of Social Gatherings with Friends and Family: More than three times a week    Attends Religious Services: Never    Marine scientist or Organizations: No    Attends Archivist Meetings: Never    Marital Status: Married  Human resources officer Violence: Not At Risk (10/28/2017)   Humiliation, Afraid, Rape, and Kick questionnaire    Fear of Current or Ex-Partner: No    Emotionally Abused: No    Physically Abused: No    Sexually Abused: No   Family History  Problem Relation Age of Onset   Heart murmur Father    Diabetes Father    Hypertension Father    Hyperlipidemia Father    Cancer Father        throat   Diabetes Mother    Cancer Mother        breast.uterine, ovarian   Asthma Son    Appendicitis Son    Hernia Son    Mental illness Son        Bipolar, personality Katelyn/o   Stroke Maternal Grandmother    Cancer Maternal Grandmother        breast   Cancer Paternal Grandfather        lung   Colon cancer Neg Hx     Objective: Office vital signs reviewed. BP (!) 149/86   Pulse 83   Temp (!) 97.2 F (36.2 C)   Ht _0  (1.778 m)   Wt (!) 361 lb 6.4 oz (163.9 kg)   SpO2 96%  BMI 51.86 kg/m   Physical Examination:  General: Awake, alert, obese, No acute distress HEENT: Sclera are white. Cardio: regular rate and rhythm  Pulm:   normal work of breathing on room air Psych: Mood stable. Slightly disheveled appearing/poorly groomed     05/26/2022   11:08 AM 03/23/2022   12:14 PM 03/16/2022    8:37 AM  Depression screen PHQ 2/9  Decreased  Interest 0 1 1  Down, Depressed, Hopeless 0 0 0  PHQ - 2 Score 0 1 1  Altered sleeping  3 3  Tired, decreased energy  0 0  Change in appetite  3 3  Feeling bad or failure about yourself   0 0  Trouble concentrating  1 1  Moving slowly or fidgety/restless  0 0  Suicidal thoughts  0 0  PHQ-9 Score  8 8  Difficult doing work/chores  Somewhat difficult Somewhat difficult      03/16/2022    8:38 AM 12/04/2021    9:16 AM 09/01/2021    8:28 AM 06/08/2021   10:05 AM  GAD 7 : Generalized Anxiety Score  Nervous, Anxious, on Edge _0 Control/stop worrying _1 Worry too much - different things _2 Trouble relaxing _3 Restless 0 _4 Easily annoyed or irritable _5 Afraid - awful might happen 0 0 2 1  Total GAD 7 Score _6 Anxiety Difficulty Somewhat difficult Somewhat difficult Somewhat difficult Somewhat difficult   Assessment/ Plan: 41 y.o. female   Type 2 diabetes mellitus with other specified complication, without long-term current use of insulin ([place]) - Plan: Microalbumin / creatinine urine ratio, Bayer DCA Hb A1c Waived  Hypertension associated with diabetes ([place])  Hyperlipidemia associated with type 2 diabetes mellitus ([place])  A1C in clinic today was 6.1 from 7.3 at her previous visit. Given that she is unable to access her Ozempic, may need to consider control with an additional agent.  Plan to get urine sample for microalbumin/creatine ratio at next visit given that patient is currently self-pay.   Blood pressure was 149/86, from 130/80 at her previous visit. Consider adding additional agent.   Explained to patient that she can call for samples of Evolocumab before she runs out of the medication in ten days.    Orders Placed This Encounter  Procedures   Microalbumin / creatinine urine ratio   Bayer DCA Hb A1c Waived   No orders of the defined types were placed in this encounter.   Stephani Police, MS3

## 2022-08-11 ENCOUNTER — Other Ambulatory Visit: Payer: Self-pay | Admitting: Physical Medicine and Rehabilitation

## 2022-08-11 DIAGNOSIS — G8929 Other chronic pain: Secondary | ICD-10-CM

## 2022-08-24 ENCOUNTER — Other Ambulatory Visit: Payer: Self-pay | Admitting: Internal Medicine

## 2022-08-24 ENCOUNTER — Other Ambulatory Visit: Payer: Self-pay | Admitting: Family Medicine

## 2022-08-24 DIAGNOSIS — E1169 Type 2 diabetes mellitus with other specified complication: Secondary | ICD-10-CM

## 2022-08-24 DIAGNOSIS — F411 Generalized anxiety disorder: Secondary | ICD-10-CM

## 2022-08-24 DIAGNOSIS — F418 Other specified anxiety disorders: Secondary | ICD-10-CM

## 2022-08-27 ENCOUNTER — Other Ambulatory Visit (HOSPITAL_COMMUNITY): Payer: Self-pay

## 2022-08-27 ENCOUNTER — Telehealth: Payer: Self-pay

## 2022-08-27 ENCOUNTER — Other Ambulatory Visit: Payer: Self-pay | Admitting: Internal Medicine

## 2022-08-27 NOTE — Telephone Encounter (Signed)
Alizandra Caughlin ([employer]) Rx #: [id number] Repatha SureClick 140MG /ML auto-injectors   Form [employer] [employer] Form [phone] NCPDP) Created 1 hour ago Sent to Plan 1 hour ago Plan Response 1 hour ago Submit Clinical Questions 33 minutes ago Determination Wait for Determination Please wait for ([phone] to return a determination.

## 2022-08-27 NOTE — Telephone Encounter (Signed)
Pharmacy Patient Advocate Encounter   Received notification from [facility] that prior authorization for Repatha Sure Click is required/requested.   PA submitted on 08/27/22 to [facility] [place] [employer] via CoverMyMeds  Key [id number]  Status is pending

## 2022-08-30 ENCOUNTER — Other Ambulatory Visit (HOSPITAL_COMMUNITY): Payer: Self-pay

## 2022-08-30 NOTE — Telephone Encounter (Signed)
L/m for patient to call back

## 2022-08-30 NOTE — Telephone Encounter (Signed)
Pharmacy Patient Advocate Encounter  Prior Authorization for Repatha SureClick 140MG /ML has been approved.    PA# J CODE: [id number] Effective dates: 08/27/2022 through 08/27/2023

## 2022-08-30 NOTE — Telephone Encounter (Signed)
Pt spouse r/c

## 2022-08-31 ENCOUNTER — Telehealth: Payer: Self-pay

## 2022-08-31 NOTE — Telephone Encounter (Signed)
PA for Tramadol submitted 

## 2022-09-01 ENCOUNTER — Ambulatory Visit: Payer: Medicaid Other | Admitting: Physical Medicine and Rehabilitation

## 2022-09-01 NOTE — Telephone Encounter (Signed)
Approved for 08/31/22-02/27/23

## 2022-09-06 ENCOUNTER — Other Ambulatory Visit: Payer: Self-pay | Admitting: Family Medicine

## 2022-09-14 ENCOUNTER — Ambulatory Visit: Payer: Medicaid Other | Attending: Cardiology | Admitting: Cardiology

## 2022-09-14 ENCOUNTER — Encounter: Payer: Self-pay | Admitting: Cardiology

## 2022-09-14 VITALS — BP 150/80 | HR 87 | Ht 70.0 in | Wt 371.6 lb

## 2022-09-14 DIAGNOSIS — I10 Essential (primary) hypertension: Secondary | ICD-10-CM | POA: Diagnosis not present

## 2022-09-14 DIAGNOSIS — E782 Mixed hyperlipidemia: Secondary | ICD-10-CM

## 2022-09-14 DIAGNOSIS — I5032 Chronic diastolic (congestive) heart failure: Secondary | ICD-10-CM

## 2022-09-14 DIAGNOSIS — I25119 Atherosclerotic heart disease of native coronary artery with unspecified angina pectoris: Secondary | ICD-10-CM

## 2022-09-14 MED ORDER — AMLODIPINE BESYLATE 5 MG PO TABS: 5.0000 mg | ORAL_TABLET | Freq: Every day | ORAL | 6 refills | Status: DC

## 2022-09-14 NOTE — Patient Instructions (Addendum)
Medication Instructions:  Begin Norvasc 5mg  daily  Continue all other medications.     Labwork: none  Testing/Procedures: none  Follow-Up: 2 months   Any Other Special Instructions Will Be Listed Below (If Applicable).   If you need a refill on your cardiac medications before your next appointment, please call your pharmacy.

## 2022-09-14 NOTE — Progress Notes (Signed)
Clinical Summary Ms. Orzel is a 41 y.o.female seen today for follow up of the following medical problems.      1. CAD - prior anterior MI 09/2011, 95% lesion in mid LAD and ramus with 95% ostial stenosis. LVEF 55% by LV gram, apical hypokinesis. BMS to mid LAD and cutting balloon angioplasty of ramus.   - repeat cath 02/2012 with patent vessels and stent - 09/2011 Echo: LVEF 50% - 12/2013 Lexiscan large anteroseptal ischemia - cath 12/2013 with patent vessels  - cath 02/2016 [facility] in setting of NSTEMI showed LM patent, LAD patent with patent stent, distal LAD 30-40%, LCX patent, RCA 30-40%. LVEDP 40, PCWP 30 mean PA 32, CI 4.93 - echo 02/2016 [place] LVEF 50%, apical akinesis       - 07/2018 cath mild to moderate disease other than small D1 99% too small for PCI. Normal LVEDP - we tried imdur 65m daily. Reported some episodes of shaking on this medication. Changed to ranexa     12/2019 admitted with NSTEMI - cath with LM patent, ostial LAD 40%, D1 99%, ramus ostial 90%, LCX patent, RCA prox 50%. Received DES to ramus     -some chest pains at times. Pressure left sided, pain down right arm. Resolves with SL NG. 1-2 times per week. Some increase in frequency over the last few months.        2. Chronic diastolic HF - recent left leg swelling about 1 month, some pain with walking.  - weight down 6 lbs  - has tosremide, takes prn about 3 times a week.  - SOB is up and down, compliant with inhalers.    - takes torsemide 469mabout 1-2 times per week. Controls edema.    3. HTN -has not taken bp meds yet   4. Hyperlipidemia   12/2019 TC 107 TG 105 HDL 27 LDL 59 Jan 2022 TC 160 TG 157 HDL 35 LDL 94 - followed by lipid clinic, she is due for f/u    11/2021 TC 204 TG 380 HDL 27 LDL 111. Had missed some repatha doses around that time.  - she is on crestor 40, repatha, zetia 1079maily - followed by lipid clinic  - was off repatha and vascepa for a few months due to  insurance issues, now back. Restarted November.   5. Left leg swelling - 02/2022 venous [place] negative.  -has resolved  Past Medical History:  Diagnosis Date   Allergy    Anemia    Anginal pain (HCC)    Anxiety    Arthritis    CHF (congestive heart failure) (HCC)    Chronic asthmatic bronchitis 08/17/2021   Chronic kidney disease    history of kidney stones   Coronary artery disease 09/2011   a.s/p BMS to LAD and angioplasty of RI in 09/2011 b. patent stent by cath in 02/2012, 12/2013, and 02/2016 with most recent showing 30-40% dLAD and 30-40% RCA stenosis   Depression    Diabetes mellitus    Dyspnea    GERD (gastroesophageal reflux disease)    History of kidney stones    Hyperlipidemia    Hypertension    Midsternal chest pain 03/03/2012   Migraines    [place]  [place]   [place]   Neuromuscular disorder ([place]  Palpitations 02/17/2012   Ureteral stone with hydronephrosis    Urinary tract obstruction due to kidney stone 02/15/2017  Allergies  Allergen Reactions   Bee Venom Anaphylaxis and Swelling   Latex Itching   Misc. Sulfonamide Containing Compounds Nausea And Vomiting   Sulfa Drugs Cross Reactors Nausea And Vomiting     Current Outpatient Medications  Medication Sig Dispense Refill   Accu-Chek Softclix Lancets lancets CHECK BOOD SUGAR FOUR TIMES DAILY Dx E11.9 (Patient not taking: Reported on 05/26/2022) 400 each 3   albuterol (VENTOLIN HFA) 108 (90 Base) MCG/ACT inhaler Inhale 2 puffs into the lungs every 4 (four) hours as needed for wheezing or shortness of breath. 8 g 5   aspirin 81 MG chewable tablet Chew 81 mg by mouth daily.     bisoprolol (ZEBETA) 5 MG tablet One twice daily 60 tablet 11   blood glucose meter kit and supplies Dispense based on patient and insurance preference. Use up to four times daily as directed. (FOR ICD-10:  E11.22). Check BG daily 1 each 0   Blood Glucose  Monitoring Suppl (ACCU-CHEK GUIDE) w/Device KIT Test BS 4 times daily Dx E11.9 1 kit 0   budesonide-formoterol (SYMBICORT) 160-4.5 MCG/ACT inhaler Take 2 puffs first thing in am and then another 2 puffs about 12 hours later. 1 each 11   busPIRone (BUSPAR) 10 MG tablet TAKE 1 TABLET BY MOUTH TWICE DAILY 60 tablet 1   clopidogrel (PLAVIX) 75 MG tablet TAKE 1 TABLET BY MOUTH EVERY DAY 90 tablet 3   DULoxetine (CYMBALTA) 60 MG capsule Take 1 capsule (60 mg total) by mouth at bedtime. 30 capsule 5   ezetimibe (ZETIA) 10 MG tablet TAKE 1 TABLET BY MOUTH EVERY DAY 90 tablet 2   fenofibrate (TRICOR) 48 MG tablet TAKE 1 TABLET BY MOUTH DAILY 30 tablet 1   FLUoxetine (PROZAC) 20 MG capsule Take 1 capsule (20 mg total) by mouth daily. Give with cymbalta- at this dose- due to nerve pain AND anxiety/depression 90 capsule 1   glucose blood (ACCU-CHEK GUIDE) test strip Test BS 4 times daily Dx E11.9 400 each 3   icosapent Ethyl (VASCEPA) 1 g capsule Take 2 capsules (2 g total) by mouth 2 (two) times daily.     losartan (COZAAR) 25 MG tablet TAKE 1 TABLET BY MOUTH DAILY 90 tablet 2   medroxyPROGESTERone (PROVERA) 5 MG tablet TAKE 1 TABLET BY MOUTH DAILY 90 tablet 2   metFORMIN (GLUCOPHAGE) 1000 MG tablet Take 1 tablet (1,000 mg total) by mouth 2 (two) times daily. 180 tablet 3   nitroGLYCERIN (NITROSTAT) 0.4 MG SL tablet DISSOLVE 1 TABLET UNDER THE TONGUE EVERY 5 MINUTES AS NEEDED FOR CHEST PAIN. DO NOT EXCEED A TOTAL OF 3 DOSES IN 15 MINUTES. 25 tablet 3   ondansetron (ZOFRAN) 4 MG tablet Take 1 tablet (4 mg total) by mouth daily as needed for nausea or vomiting. 30 tablet 1   pantoprazole (PROTONIX) 40 MG tablet TAKE 1 TABLET BY MOUTH DAILY 30 tablet 1   potassium chloride SA (KLOR-CON M) 20 MEQ tablet Take 1 tablet (20 mEq total) by mouth daily as needed (when taking torsemide). 90 tablet 1   pregabalin (LYRICA) 150 MG capsule TAKE ONE CAPSULE BY MOUTH TWICE DAILY 60 capsule 5   ranolazine (RANEXA) 1000 MG SR  tablet TAKE 1 TABLET BY MOUTH TWICE DAILY 180 tablet 3   REPATHA SURECLICK 235 MG/ML SOAJ INJECT 140 MG INTO THE SKIN EVERY 14 DAYS 2 mL 6   rosuvastatin (CRESTOR) 40 MG tablet TAKE 1 TABLET BY MOUTH DAILY 30 tablet 6   Semaglutide, 1 MG/DOSE, 4  MG/3ML SOPN Inject 1 mg as directed once a week. To REPLACE 0.65m Ozempic 9 mL 3   torsemide (DEMADEX) 20 MG tablet TAKE 2 TABLETS BY MOUTH DAILY AS NEEDED FOR swelling 180 tablet 3   traMADol (ULTRAM) 50 MG tablet TAKE 2 TABLETS BY MOUTH TWICE DAILY 120 tablet 5   Current Facility-Administered Medications  Medication Dose Route Frequency Provider Last Rate Last Admin   lidocaine (XYLOCAINE) 1 % (with pres) injection 6 mL  6 mL Other Once LCourtney Heys MD         Past Surgical History:  Procedure Laterality Date   BIOPSY  10/27/2020   Procedure: BIOPSY;  Surgeon: RDaneil Dolin MD;  [facility];  Service: Endoscopy;;   CARDIAC CATHETERIZATION  10/08/2011   LAD: 95% mid, Ramus: 95% ostial   CARPAL TUNNEL RELEASE     CESAREAN SECTION     CHOLECYSTECTOMY     CORONARY ANGIOPLASTY WITH STENT PLACEMENT  10/08/2011   LAD: BMS, Ramus: cutting balloon angioplasty   CORONARY STENT INTERVENTION N/A 01/14/2020   Procedure: CORONARY STENT INTERVENTION;  Surgeon: HLeonie Man MD;  [facility];  Service: Cardiovascular;  Laterality: N/A;   CYSTOSCOPY WITH RETROGRADE PYELOGRAM, URETEROSCOPY AND STENT PLACEMENT Left 02/16/2017   Procedure: CYSTOSCOPY WITH RETROGRADE PYELOGRAM, URETEROSCOPY AND STENT PLACEMENT;  Surgeon: OKathie Rhodes MD;  Location: [facility];  Service: Urology;  Laterality: Left;   CYSTOSCOPY WITH RETROGRADE PYELOGRAM, URETEROSCOPY AND STENT PLACEMENT Left 03/28/2017   Procedure: LEFT STENT REMOVAL LEFT RETROGRADE PYELOGRAM LEFT URETEROSCOPY   LASER LITHOTRIPSY  AND  STENT REPLACEMENT;  Surgeon: MCleon Gustin MD;  Location: [facility];  Service: Urology;  Laterality: Left;   CYSTOSCOPY WITH RETROGRADE  PYELOGRAM, URETEROSCOPY AND STENT PLACEMENT Left 04/13/2022   Procedure: CYSTOSCOPY WITH RETROGRADE PYELOGRAM, URETEROSCOPY AND STENT PLACEMENT, BASKET EXTRACTION;  Surgeon: MCleon Gustin MD;  Location: [facility];  Service: Urology;  Laterality: Left;   ESOPHAGOGASTRODUODENOSCOPY (EGD) WITH PROPOFOL N/A 10/27/2020   Rourk: mild erosive reflux esophagitis, h.pylori gastritis   LEFT HEART CATH AND CORONARY ANGIOGRAPHY N/A 08/15/2018   Procedure: LEFT HEART CATH AND CORONARY ANGIOGRAPHY;  Surgeon: JMartinique Peter M, MD;  [facility];  Service: Cardiovascular;  Laterality: N/A;   LEFT HEART CATH AND CORONARY ANGIOGRAPHY N/A 01/14/2020   Procedure: LEFT HEART CATH AND CORONARY ANGIOGRAPHY;  Surgeon: HLeonie Man MD;  [facility];  Service: Cardiovascular;  Laterality: N/A;   LEFT HEART CATHETERIZATION WITH CORONARY ANGIOGRAM N/A 10/08/2011   Procedure: LEFT HEART CATHETERIZATION WITH CORONARY ANGIOGRAM;  Surgeon: Peter M JMartinique MD;  [facility];  Service: Cardiovascular;  Laterality: N/A;   LEFT HEART CATHETERIZATION WITH CORONARY ANGIOGRAM N/A 03/02/2012   Procedure: LEFT HEART CATHETERIZATION WITH CORONARY ANGIOGRAM;  Surgeon: CBurnell Blanks MD;  [facility];  Service: Cardiovascular;  Laterality: N/A;   LEFT HEART CATHETERIZATION WITH CORONARY ANGIOGRAM N/A 01/04/2014   Procedure: LEFT HEART CATHETERIZATION WITH CORONARY ANGIOGRAM;  Surgeon: TTroy Sine MD;  [facility];  Service: Cardiovascular;  Laterality: N/A;   right hand pinky surgery       Allergies  Allergen Reactions   Bee Venom Anaphylaxis and Swelling   Latex Itching   Misc. Sulfonamide Containing Compounds Nausea And Vomiting   Sulfa Drugs Cross Reactors Nausea And Vomiting      Family History  Problem Relation Age of Onset   Heart murmur Father    Diabetes  Father    Hypertension Father    Hyperlipidemia Father    Cancer Father         throat   Diabetes Mother    Cancer Mother        breast.uterine, ovarian   Asthma Son    Appendicitis Son    Hernia Son    Mental illness Son        Bipolar, personality d/o   Stroke Maternal Grandmother    Cancer Maternal Grandmother        breast   Cancer Paternal Grandfather        lung   Colon cancer Neg Hx      Social History Ms. Gaskins reports that she has been smoking cigarettes. She started smoking about 32 years ago. She has a 10.00 pack-year smoking history. She has never used smokeless tobacco. Ms. Tschantz reports that she does not currently use alcohol.   Review of Systems CONSTITUTIONAL: No weight loss, fever, chills, weakness or fatigue.  HEENT: Eyes: No visual loss, blurred vision, double vision or yellow sclerae.No hearing loss, sneezing, congestion, runny nose or sore throat.  SKIN: No rash or itching.  CARDIOVASCULAR: per hpi RESPIRATORY: No shortness of breath, cough or sputum.  GASTROINTESTINAL: No anorexia, nausea, vomiting or diarrhea. No abdominal pain or blood.  GENITOURINARY: No burning on urination, no polyuria NEUROLOGICAL: No headache, dizziness, syncope, paralysis, ataxia, numbness or tingling in the extremities. No change in bowel or bladder control.  MUSCULOSKELETAL: No muscle, back pain, joint pain or stiffness.  LYMPHATICS: No enlarged nodes. No history of splenectomy.  PSYCHIATRIC: No history of depression or anxiety.  ENDOCRINOLOGIC: No reports of sweating, cold or heat intolerance. No polyuria or polydipsia.  Marland Kitchen   Physical Examination Today's Vitals   09/14/22 1127 09/14/22 1218  BP: (!) 151/84 (!) 150/80  Pulse: 87   SpO2: 98%   Weight: (!) 371 lb 9.6 oz (168.6 kg)   Height: _0  (1.778 m)    Body mass index is 53.32 kg/m.  Gen: resting comfortably, no acute distress HEENT: no scleral icterus, pupils equal round and reactive, no palptable cervical adenopathy,  CV: RRR, no m/r/g no jvd Resp: Clear to auscultation bilaterally GI:  abdomen is soft, non-tender, non-distended, normal bowel sounds, no hepatosplenomegaly MSK: extremities are warm, no edema.  Skin: warm, no rash Neuro:  no focal deficits Psych: appropriate affect   Diagnostic Studies  02/2012 Cath Hemodynamic Findings:   Central aortic pressure: 108/70   Left ventricular pressure: 123/10/21   Angiographic Findings:   Left main: No obstructive disease noted.   Left Anterior Descending Artery: Large caliber vessel that courses to the apex. There is a stent present in the mid vessel that is widely patent with no restenosis. The remainder of the LAD is disease free. There is a moderate sized diagonal Satchel Heidinger with 30% proximal stenosis.   Circumflex Artery: Dominant, large caliber vessel with no disease throughout the AV groove segment. There is small to moderate sized intermediate Wrangler Penning that has ostial 30% stenosis. The remainder of the Circumflex has no obstructive disease.   Right Coronary Artery: Small, non-dominant vessel. No disease noted.   Left Ventricular Angiogram: LVEF 50-55%.   Impression:   1. Double vessel CAD with patent stent mid LAD and patent angioplasty site at ostium of intermediate Nefertiti Mohamad.   2. Normal LV systolic function     09/9377 Cath PROCEDURAL FINDINGS   Hemodynamics:   AO 135/92 with a mean of 114 mmHg  LV 136/25 mmHg   Coronary angiography:   Coronary dominance: Left   Left mainstem: Normal.   Left anterior descending (LAD): There is a 95% stenosis in the mid LAD immediately after the takeoff of the first diagonal. The first diagonal Rigby Leonhardt is moderate in size and appears normal. The LAD stenosis does appear to be hazy.   There is a moderately large ramus intermediate Kienna Moncada which has a 95% ostial stenosis.   Left circumflex (LCx): This is a dominant vessel and appears normal throughout.   Right coronary artery (RCA): This is a small nondominant vessel and is normal.   Left ventriculography: Left ventricular systolic  function is abnormal with apical hypokinesis. LVEF is estimated at 55%, there is no significant mitral regurgitation   PCI Note: Following the diagnostic procedure, the decision was made to proceed with PCI of the LAD. Weight-based bivalirudin was given for anticoagulation. Once a therapeutic ACT was achieved, a 5 [place] EBU guide catheter was inserted. A pro-water coronary guidewire was used to cross the lesion. The lesion was predilated with a 2.5 mm balloon. The lesion was then stented with a 3.0 x 18 mm vision stent. The stent was postdilated with a 3.25 mm noncompliant balloon. Following PCI, there was 0% residual stenosis and TIMI-3 flow. At this point the patient's pain was improved but was [place] of moderate intensity. We then proceeded to intervene on the ostial ramus intermediate lesion. This was crossed with the prowater wire. This was dilated using a 3.0 x 10 mm cutting balloon performing 3 inflations to 6 atmospheres. This showed a good angiographic result with less than 30% residual stenosis and TIMI grade 3 flow. Final angiography confirmed an excellent result. The patient tolerated the procedure well. There were no immediate procedural complications. A TR band was used for radial hemostasis. The patient was transferred to the post catheterization recovery area for further monitoring.   PCI Data:   Vessel - LAD/Segment - mid vessel immediately after the takeoff of the first diagonal.   Percent Stenosis (pre) 95%   TIMI-flow 3.   Stent 3.0 x 18 mm vision stent.   Percent Stenosis (post) 0%   TIMI-flow (post) 3.   Second vessel: Ostial ramus intermediate Angas Isabell   Percent stenosis: 95%.   TIMI flow 3.   3.0 mm cutting balloon   Percent stenosis (post) less than 30%   TIMI flow 3   Final Conclusions:   1. Severe 2 vessel obstructive coronary disease.   2. Overall well preserved LV systolic function with apical wall motion abnormality and ejection fraction of 55%.   3. Successful  intracoronary stenting of the mid LAD with a bare-metal stent.   4. Successful cutting balloon angioplasty of the ramus intermediate Honorio Devol.   Recommendations:   Continue therapy with Plavix. Patient reports a history of allergy to aspirin. Aggressive risk factor modification.   Disposition: Patient be observed in the intensive care unit tonight. If she has no complications she should be able to be transferred to telemetry in the morning.     12/2013 Cath HEMODYNAMICS:    Central Aorta: 100/60    Left Ventricle: 100/3   ANGIOGRAPHY:   1. Left main: Normal and trifurcated into an LAD, ramus intermediate vessel, and a dominant left circumflex coronary artery   2. LAD: Leandrew Koyanagi gave rise to a proximal large first diagonal vessel.  The LAD just after the diagonal vessel had a widely patent stent.  The remainder of the LAD was free of  significant disease and extended to the LV apex. 3. Ramus Intermediate: No evidence for restenosis at previous site of ostial cutting balloon intervention. 4. Left circumflex: Angiographically normal.  Dominant vessel, which gave rise to 2 marginal branches and in the PDA.Marland Kitchen   4. Right coronary artery: Angiographically normal, nondominant vessel   Left ventriculography revealed global LV function.  There was a suggestion of mild residual distal anterolateral hypocontractility.  There was catheter-induced mitral regurgitation.   IMPRESSION:   Normal LV function with mild distal anterolateral, residual hypocontractility   No significant residual coronary obstructive disease with evidence for widely patent LAD stent, no evidence for restenosis of the ramus intermediate vessel, normal dominant left circumflex coronary artery and normal nondominant right coronary artery.       12/2013 MPI IMPRESSION: 1.  Abnormal Lexiscan for ischemia   2.  Large anteroseptal and apical area of ischemia   3. High risk study for major cardiac events based on area of myocardium at  [facility]   4. Normal left ventricular systolic function, left ventricular ejection fraction 52%     12/2019 cath A drug-eluting stent was successfully placed using a STENT RESOLUTE ONYX 2.5X30. Postdilated to 2.7 mm Post intervention, there is a 0% residual stenosis throughout the entire segment.Marland Kitchen ------------ Colon Flattery LAD lesion is 40% stenosed. Prox LAD previously placed BMS stent is 30% stenosed. Prox LAD to Mid LAD lesion is 20% stenosed. Ost 1st Diag lesion is 99% stenosed. Known from prior catheterizations Small, nondominant RCA: Prox RCA to Dist RCA lesion is 50% stenosed. -------------- The left ventricular systolic function is normal. The left ventricular ejection fraction is 55-65% by visual estimate. LV end diastolic pressure is normal.   CULPRIT LESION: Ostial and proximal RAMUS INTERMEDIUS (RI) 95-70%  Successful DES PCI of RI (RESOLUTE ONYX DES 2.5 mm x 30 mm--2.7 mm) Otherwise moderate diffuse disease: Diffuse moderate LAD 20-30% stenosis and known 99% subtotal occlusion of the very small caliber 1st Diag Preserved LVEF and normal EDP.   Assessment and Plan   1. CAD with angina pectoris - due to disease burden have elected for extended plavix use - mild chest pains at times perhaps due to residual small vessel diag disease - we will add norvasc 23m daily (prior side effects on imdur). Monitor symptoms, if significant progression may need to consider repeat cath.      2. Chronic diastolic HF - doing well on prn tosremide, continue current meds     3. HTN -elevated but has not taken meds yet , continue to monitor.   4. Hyperlipidemia - was off vascepa and repatha for a few months due to insurance issues, now back on. Continue current meds       JArnoldo Lenis M.D.

## 2022-09-16 ENCOUNTER — Other Ambulatory Visit: Payer: Self-pay | Admitting: Family Medicine

## 2022-09-16 DIAGNOSIS — E1169 Type 2 diabetes mellitus with other specified complication: Secondary | ICD-10-CM

## 2022-09-28 LAB — HM DIABETES EYE EXAM

## 2022-10-12 ENCOUNTER — Telehealth: Payer: Self-pay | Admitting: Internal Medicine

## 2022-10-12 DIAGNOSIS — R0602 Shortness of breath: Secondary | ICD-10-CM

## 2022-10-12 MED ORDER — ALBUTEROL SULFATE HFA 108 (90 BASE) MCG/ACT IN AERS: 2.0000 | INHALATION_SPRAY | RESPIRATORY_TRACT | 2 refills | Status: DC | PRN

## 2022-10-12 MED ORDER — BISOPROLOL FUMARATE 5 MG PO TABS: ORAL_TABLET | ORAL | 3 refills | Status: DC

## 2022-10-12 MED ORDER — BUDESONIDE-FORMOTEROL FUMARATE 160-4.5 MCG/ACT IN AERO: INHALATION_SPRAY | RESPIRATORY_TRACT | 2 refills | Status: DC

## 2022-10-12 NOTE — Telephone Encounter (Signed)
Patient wants a refill on bp medication last ov 07/2021 patient is at [facility] drug waiting for refill please advise

## 2022-10-12 NOTE — Telephone Encounter (Signed)
Ok x 3 months only - bp mes really should be PCP but I'll discuss how best to do this handoff on f/u ov

## 2022-10-12 NOTE — Addendum Note (Signed)
Addended by: Fritzi Mandes D on: 10/12/2022 04:10 PM   Modules accepted: Orders

## 2022-10-12 NOTE — Telephone Encounter (Signed)
Symbicort and albuterol refills sent with 2 refills to last patient until next ov. Nothing further needed

## 2022-10-12 NOTE — Telephone Encounter (Signed)
Called and notified husband and he is not with patient. Advised him I would call pharmacy so they were aware bisoprolol was sent in. Called [facility] and pharm tech states they have the medication. Nothing further needed at this time.

## 2022-10-18 ENCOUNTER — Other Ambulatory Visit: Payer: Self-pay | Admitting: Cardiology

## 2022-10-18 ENCOUNTER — Other Ambulatory Visit: Payer: Self-pay | Admitting: Family Medicine

## 2022-10-18 ENCOUNTER — Other Ambulatory Visit: Payer: Self-pay | Admitting: Physical Medicine and Rehabilitation

## 2022-10-18 DIAGNOSIS — F411 Generalized anxiety disorder: Secondary | ICD-10-CM

## 2022-10-18 DIAGNOSIS — F418 Other specified anxiety disorders: Secondary | ICD-10-CM

## 2022-10-18 DIAGNOSIS — E1140 Type 2 diabetes mellitus with diabetic neuropathy, unspecified: Secondary | ICD-10-CM

## 2022-10-18 DIAGNOSIS — E1169 Type 2 diabetes mellitus with other specified complication: Secondary | ICD-10-CM

## 2022-10-20 ENCOUNTER — Ambulatory Visit: Payer: Medicaid Other | Admitting: Family Medicine

## 2022-10-20 ENCOUNTER — Encounter: Payer: Self-pay | Admitting: Family Medicine

## 2022-10-20 VITALS — BP 113/75 | HR 84 | Temp 98.5°F | Ht 70.0 in | Wt 369.0 lb

## 2022-10-20 DIAGNOSIS — I152 Hypertension secondary to endocrine disorders: Secondary | ICD-10-CM

## 2022-10-20 DIAGNOSIS — E1159 Type 2 diabetes mellitus with other circulatory complications: Secondary | ICD-10-CM | POA: Diagnosis not present

## 2022-10-20 DIAGNOSIS — I2510 Atherosclerotic heart disease of native coronary artery without angina pectoris: Secondary | ICD-10-CM

## 2022-10-20 DIAGNOSIS — E1169 Type 2 diabetes mellitus with other specified complication: Secondary | ICD-10-CM

## 2022-10-20 DIAGNOSIS — Z23 Encounter for immunization: Secondary | ICD-10-CM

## 2022-10-20 DIAGNOSIS — E785 Hyperlipidemia, unspecified: Secondary | ICD-10-CM | POA: Diagnosis not present

## 2022-10-20 LAB — CBC WITH DIFFERENTIAL/PLATELET
Basophils Absolute: 0.1 10*3/uL (ref 0.0–0.2)
Basos: 0 %
EOS (ABSOLUTE): 0.1 10*3/uL (ref 0.0–0.4)
Eos: 1 %
Hematocrit: 40.4 % (ref 34.0–46.6)
Hemoglobin: 13.6 g/dL (ref 11.1–15.9)
Immature Grans (Abs): 0.1 10*3/uL (ref 0.0–0.1)
Immature Granulocytes: 1 %
Lymphocytes Absolute: 3.2 10*3/uL — ABNORMAL HIGH (ref 0.7–3.1)
Lymphs: 24 %
MCH: 30.8 pg (ref 26.6–33.0)
MCHC: 33.7 g/dL (ref 31.5–35.7)
MCV: 92 fL (ref 79–97)
Monocytes Absolute: 0.7 10*3/uL (ref 0.1–0.9)
Monocytes: 6 %
Neutrophils Absolute: 8.9 10*3/uL — ABNORMAL HIGH (ref 1.4–7.0)
Neutrophils: 68 %
Platelets: 297 10*3/uL (ref 150–450)
RBC: 4.41 x10E6/uL (ref 3.77–5.28)
RDW: 12.6 % (ref 11.7–15.4)
WBC: 13.1 10*3/uL — ABNORMAL HIGH (ref 3.4–10.8)

## 2022-10-20 LAB — LIPID PANEL
Chol/HDL Ratio: 3.1 ratio (ref 0.0–4.4)
Cholesterol, Total: 113 mg/dL (ref 100–199)
HDL: 37 mg/dL — ABNORMAL LOW (ref >39)
LDL Chol Calc (NIH): 54 mg/dL (ref 0–99)
Triglycerides: 123 mg/dL (ref 0–149)
VLDL Cholesterol Cal: 22 mg/dL (ref 5–40)

## 2022-10-20 LAB — CMP14+EGFR
ALT: 10 IU/L (ref 0–32)
AST: 13 IU/L (ref 0–40)
Albumin/Globulin Ratio: 1.4 (ref 1.2–2.2)
Albumin: 3.8 g/dL — ABNORMAL LOW (ref 3.9–4.9)
Alkaline Phosphatase: 69 IU/L (ref 44–121)
BUN/Creatinine Ratio: 15 (ref 9–23)
BUN: 11 mg/dL (ref 6–24)
Bilirubin Total: 0.3 mg/dL (ref 0.0–1.2)
CO2: 18 mmol/L — ABNORMAL LOW (ref 20–29)
Calcium: 9.1 mg/dL (ref 8.7–10.2)
Chloride: 103 mmol/L (ref 96–106)
Creatinine, Ser: 0.74 mg/dL (ref 0.57–1.00)
Globulin, Total: 2.8 g/dL (ref 1.5–4.5)
Glucose: 117 mg/dL — ABNORMAL HIGH (ref 70–99)
Potassium: 4.2 mmol/L (ref 3.5–5.2)
Sodium: 136 mmol/L (ref 134–144)
Total Protein: 6.6 g/dL (ref 6.0–8.5)
eGFR: 103 mL/min/{1.73_m2} (ref >59)

## 2022-10-20 LAB — BAYER DCA HB A1C WAIVED: HB A1C (BAYER DCA - WAIVED): 5.9 % — ABNORMAL HIGH (ref 4.8–5.6)
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
## 2022-10-21 LAB — MICROALBUMIN / CREATININE URINE RATIO
Creatinine, Urine: 117.3 mg/dL
Microalb/Creat Ratio: 183 mg/g creat — ABNORMAL HIGH (ref 0–29)
Microalbumin, Urine: 214.4 ug/mL

## 2022-10-22 ENCOUNTER — Other Ambulatory Visit: Payer: Self-pay | Admitting: Family Medicine

## 2022-10-22 DIAGNOSIS — D72829 Elevated white blood cell count, unspecified: Secondary | ICD-10-CM

## 2022-10-29 ENCOUNTER — Telehealth: Payer: Self-pay | Admitting: Family Medicine

## 2022-11-01 ENCOUNTER — Encounter
Payer: Medicaid Other | Attending: Physical Medicine and Rehabilitation | Admitting: Physical Medicine and Rehabilitation

## 2022-11-11 NOTE — Progress Notes (Signed)
[place] [address] [place] [address]   Clinic Day:  11/11/2022  Referring physician: Janora Norlander, DO  Patient Care Team: Janora Norlander, DO as PCP - General (Family Medicine) Harl Bowie Alphonse Guild, MD as PCP - Cardiology (Cardiology) Gala Romney Cristopher Estimable, MD as Consulting Physician (Gastroenterology)   ASSESSMENT & PLAN:   Assessment:  1.  Neutrophilic leukocytosis: - She does not report recurrent infections.  Occasional UTI once or twice per year. - No fevers or night sweats.  Lost 65 pounds in the last 1 year while being on Ozempic. - Symbicort was started 1 year ago after having COVID for the fifth time.  She has not taken since September 2023 due to insurance reasons. - CTAP (05/18/2020): Spleen normal.  Fatty liver seen.  2.  Social/family history: - She lives at home with her husband.  She is currently not working and is trying to get on disability.  She used to work on cars, Geophysical data processor. - Current active smoker, half pack per day for 30 years. - No family history of leukemia. - Maternal grandmother and maternal great grandmother had breast cancer.  Mother had breast cancer.  Father had throat cancer.  Paternal grandfather had lung cancer.   Plan:  1.  Neutrophilic leukocytosis: - We talked about the differential diagnosis of neutrophilic leukocytosis. - Most likely etiology is smoking-related. - Will repeat her CBC today, and will do workup for myeloproliferative neoplasms.  Will also check ESR/CRP/ANA and rheumatoid factor. - RTC 4 weeks for follow-up.   No orders of the defined types were placed in this encounter.     Beverly Gust Oliver,acting as a scribe for Derek Jack, MD.,have documented all relevant documentation on the behalf of Derek Jack, MD,as directed by  Derek Jack, MD while in the presence of Derek Jack, MD.   I, Derek Jack MD, have reviewed the above  documentation for accuracy and completeness, and I agree with the above.   Doyce Loose   2/15/20244:42 PM  CHIEF COMPLAINT/PURPOSE OF CONSULT:   Diagnosis: Leukocytosis   Current Therapy:  Working Up  HISTORY OF PRESENT ILLNESS:   [name] is a 42 y.o. female presenting to clinic today for evaluation of Leukocytosis at the request of Dr. Ronnie Doss.   Today, she states that she is doing well overall. Her appetite level is at 60%. Her energy level is at 25%. She has 6/10 abdominal pain and stabbing feeling when eating and drinking.   She has chronic congestive heart failure. She has 2 stents. She had 2 heart attacks. Her mom also had two heart attacks. She is also a diabetic and also has fyomialga. She denies leukemia in the family.  She denies having a good appetite. She denies fevers and unintentional weight lost. However, she has lost 65lbs in the past year due to ozempic.   She occasionally get a UTI 1-2 a yr due to kidney stone. She has neuropathy in feet and hands.   Her grandma and great grandma had breast cancer. Her dad had throat cancer. Her grandfather had lung cancer.    She smokes  half pack a day for the past 20yr.   She is working to get on disability. She previously worked on cSpecial educational needs teacher  PAST MEDICAL HISTORY:   Past Medical History: Past Medical History:  Diagnosis Date   Allergy    Anemia    Anginal pain ([place]    Anxiety  Arthritis    CHF (congestive heart failure) (HCC)    Chronic asthmatic bronchitis 08/17/2021   Chronic kidney disease    history of kidney stones   Coronary artery disease 09/2011   a.s/p BMS to LAD and angioplasty of RI in 09/2011 b. patent stent by cath in 02/2012, 12/2013, and 02/2016 with most recent showing 30-40% dLAD and 30-40% RCA stenosis   Depression    Diabetes mellitus    Dyspnea    GERD (gastroesophageal reflux disease)    History of kidney stones    Hyperlipidemia    Hypertension     Midsternal chest pain 03/03/2012   Migraines    Morbid obesity ([place])    Myocardial infarct ([place]) 09/2011   Myocardial infarction ([place]) 02/2016   Neuromuscular disorder ([place])    Palpitations 02/17/2012   Ureteral stone with hydronephrosis    Urinary tract obstruction due to kidney stone 02/15/2017    Surgical History: Past Surgical History:  Procedure Laterality Date   BIOPSY  10/27/2020   Procedure: BIOPSY;  Surgeon: Daneil Dolin, MD;  [facility];  Service: Endoscopy;;   CARDIAC CATHETERIZATION  10/08/2011   LAD: 95% mid, Ramus: 95% ostial   CARPAL TUNNEL RELEASE     CESAREAN SECTION     CHOLECYSTECTOMY     CORONARY ANGIOPLASTY WITH STENT PLACEMENT  10/08/2011   LAD: BMS, Ramus: cutting balloon angioplasty   CORONARY STENT INTERVENTION N/A 01/14/2020   Procedure: CORONARY STENT INTERVENTION;  Surgeon: Leonie Man, MD;  Location: [facility];  Service: Cardiovascular;  Laterality: N/A;   CYSTOSCOPY WITH RETROGRADE PYELOGRAM, URETEROSCOPY AND STENT PLACEMENT Left 02/16/2017   Procedure: CYSTOSCOPY WITH RETROGRADE PYELOGRAM, URETEROSCOPY AND STENT PLACEMENT;  Surgeon: Kathie Rhodes, MD;  Location: [facility];  Service: Urology;  Laterality: Left;   CYSTOSCOPY WITH RETROGRADE PYELOGRAM, URETEROSCOPY AND STENT PLACEMENT Left 03/28/2017   Procedure: LEFT STENT REMOVAL LEFT RETROGRADE PYELOGRAM LEFT URETEROSCOPY   LASER LITHOTRIPSY  AND  STENT REPLACEMENT;  Surgeon: Cleon Gustin, MD;  Location: [facility];  Service: Urology;  Laterality: Left;   CYSTOSCOPY WITH RETROGRADE PYELOGRAM, URETEROSCOPY AND STENT PLACEMENT Left 04/13/2022   Procedure: CYSTOSCOPY WITH RETROGRADE PYELOGRAM, URETEROSCOPY AND STENT PLACEMENT, BASKET EXTRACTION;  Surgeon: Cleon Gustin, MD;  Location: [facility];  Service: Urology;  Laterality: Left;   ESOPHAGOGASTRODUODENOSCOPY (EGD) WITH PROPOFOL N/A 10/27/2020   [place]ourk: mild erosive reflux esophagitis, h.pylori gastritis   LEFT HEART CATH  AND CORONARY ANGIOGRAPHY N/A 08/15/2018   Procedure: LEFT HEART CATH AND CORONARY ANGIOGRAPHY;  Surgeon: Martinique, Peter M, MD;  Location: [facility];  Service: Cardiovascular;  Laterality: N/A;   LEFT HEART CATH AND CORONARY ANGIOGRAPHY N/A 01/14/2020   Procedure: LEFT HEART CATH AND CORONARY ANGIOGRAPHY;  Surgeon: Leonie Man, MD;  Location: [facility];  Service: Cardiovascular;  Laterality: N/A;   LEFT HEART CATHETERIZATION WITH CORONARY ANGIOGRAM N/A 10/08/2011   Procedure: LEFT HEART CATHETERIZATION WITH CORONARY ANGIOGRAM;  Surgeon: Peter M Martinique, MD;  [facility];  Service: Cardiovascular;  Laterality: N/A;   LEFT HEART CATHETERIZATION WITH CORONARY ANGIOGRAM N/A 03/02/2012   Procedure: LEFT HEART CATHETERIZATION WITH CORONARY ANGIOGRAM;  Surgeon: Burnell Blanks, MD;  Location: [facility] CATH LAB;  Service: Cardiovascular;  Laterality: N/A;   LEFT HEART CATHETERIZATION WITH CORONARY ANGIOGRAM N/A 01/04/2014   Procedure: LEFT HEART CATHETERIZATION WITH CORONARY ANGIOGRAM;  Surgeon: Troy Sine, MD;  Location: [facility] CATH LAB;  Service: Cardiovascular;  Laterality: N/A;  right hand pinky surgery      Social History: Social History   Socioeconomic History   Marital status: Married    Spouse name: Barnabas Lister   Number of children: 2   Years of education: 10   Highest education level: Not on file  Occupational History   Not on file  Tobacco Use   Smoking status: Some Days    Packs/day: 0.50    Years: 20.00    Total pack years: 10.00    Types: Cigarettes    Start date: 03/18/1990   Smokeless tobacco: Never   Tobacco comments:    Vapes currently   Vaping Use   Vaping Use: Some days   Substances: Nicotine  Substance and Sexual Activity   Alcohol use: Not Currently    Alcohol/week: 0.0 standard drinks of alcohol    Comment: maybe once a year   Drug use: No   Sexual activity: Yes    Birth control/protection: None    Comment: PCOS  Other Topics Concern    Not on file  Social History Narrative   Unemployed   Applied for disability   Leisure "take care of house and kids"   Walks when able   Social Determinants of Health   Financial Resource Strain: Low Risk  (10/28/2017)   Overall Financial Resource Strain (CARDIA)    Difficulty of Paying Living Expenses: Not hard at all  Food Insecurity: No Food Insecurity (10/28/2017)   Hunger Vital Sign    Worried About Running Out of Food in the Last Year: Never true    [place] in the Last Year: Never true  Transportation Needs: No Transportation Needs (10/28/2017)   PRAPARE - Hydrologist (Medical): No    Lack of Transportation (Non-Medical): No  Physical Activity: Inactive (10/28/2017)   Exercise Vital Sign    Days of Exercise per Week: 0 days    Minutes of Exercise per Session: 0 min  Stress: Stress Concern Present (10/28/2017)   [place]    Feeling of Stress : To some extent  Social Connections: Somewhat Isolated (10/28/2017)   Social Connection and Isolation Panel [NHANES]    Frequency of Communication with Friends and Family: More than three times a week    Frequency of Social Gatherings with Friends and Family: More than three times a week    Attends Religious Services: Never    Marine scientist or Organizations: No    Attends Archivist Meetings: Never    Marital Status: Married  Human resources officer Violence: Not At Risk (10/28/2017)   Humiliation, Afraid, Rape, and Kick questionnaire    Fear of Current or Ex-Partner: No    Emotionally Abused: No    Physically Abused: No    Sexually Abused: No    Family History: Family History  Problem Relation Age of Onset   Heart murmur Father    Diabetes Father    Hypertension Father    Hyperlipidemia Father    Cancer Father        throat   Diabetes Mother    Cancer Mother        breast.uterine, ovarian   Asthma Son     Appendicitis Son    Hernia Son    Mental illness Son        Bipolar, personality d/o   Stroke Maternal Grandmother    Cancer Maternal Grandmother  breast   Cancer Paternal Grandfather        lung   Colon cancer Neg Hx     Current Medications:  Current Outpatient Medications:    Accu-Chek Softclix Lancets lancets, CHECK BOOD SUGAR FOUR TIMES DAILY Dx E11.9, Disp: 400 each, Rfl: 3   albuterol (VENTOLIN HFA) 108 (90 Base) MCG/ACT inhaler, Inhale 2 puffs into the lungs every 4 (four) hours as needed for wheezing or shortness of breath., Disp: 8 g, Rfl: 2   amLODipine (NORVASC) 5 MG tablet, Take 1 tablet (5 mg total) by mouth daily., Disp: 30 tablet, Rfl: 6   aspirin 81 MG chewable tablet, Chew 81 mg by mouth daily., Disp: , Rfl:    bisoprolol (ZEBETA) 5 MG tablet, One twice daily, Disp: 60 tablet, Rfl: 3   blood glucose meter kit and supplies, Dispense based on patient and insurance preference. Use up to four times daily as directed. (FOR ICD-10:  E11.22). Check BG daily, Disp: 1 each, Rfl: 0   Blood Glucose Monitoring Suppl (ACCU-CHEK GUIDE) w/Device KIT, Test BS 4 times daily Dx E11.9, Disp: 1 kit, Rfl: 0   budesonide-formoterol (SYMBICORT) 160-4.5 MCG/ACT inhaler, Take 2 puffs first thing in am and then another 2 puffs about 12 hours later., Disp: 10.2 g, Rfl: 2   busPIRone (BUSPAR) 10 MG tablet, TAKE 1 TABLET BY MOUTH TWICE DAILY, Disp: 60 tablet, Rfl: 0   clopidogrel (PLAVIX) 75 MG tablet, TAKE 1 TABLET BY MOUTH EVERY DAY, Disp: 90 tablet, Rfl: 3   DULoxetine (CYMBALTA) 60 MG capsule, Take 1 capsule (60 mg total) by mouth at bedtime., Disp: 30 capsule, Rfl: 5   ezetimibe (ZETIA) 10 MG tablet, TAKE 1 TABLET BY MOUTH EVERY DAY, Disp: 90 tablet, Rfl: 2   fenofibrate (TRICOR) 48 MG tablet, TAKE 1 TABLET BY MOUTH DAILY, Disp: 30 tablet, Rfl: 0   FLUoxetine (PROZAC) 20 MG capsule, Take 1 capsule (20 mg total) by mouth daily. Give with cymbalta- at this dose- due to nerve pain AND  anxiety/depression, Disp: 90 capsule, Rfl: 1   glucose blood (ACCU-CHEK GUIDE) test strip, Test BS 4 times daily Dx E11.9, Disp: 400 each, Rfl: 3   icosapent Ethyl (VASCEPA) 1 g capsule, Take 2 capsules (2 g total) by mouth 2 (two) times daily., Disp: , Rfl:    losartan (COZAAR) 25 MG tablet, TAKE 1 TABLET BY MOUTH DAILY, Disp: 90 tablet, Rfl: 2   medroxyPROGESTERone (PROVERA) 5 MG tablet, TAKE 1 TABLET BY MOUTH DAILY, Disp: 90 tablet, Rfl: 2   metFORMIN (GLUCOPHAGE) 1000 MG tablet, Take 1 tablet (1,000 mg total) by mouth 2 (two) times daily., Disp: 180 tablet, Rfl: 3   nitroGLYCERIN (NITROSTAT) 0.4 MG SL tablet, DISSOLVE 1 TABLET UNDER THE TONGUE EVERY 5 MINUTES AS NEEDED FOR CHEST PAIN. DO NOT EXCEED A TOTAL OF 3 DOSES IN 15 MINUTES., Disp: 25 tablet, Rfl: 3   ondansetron (ZOFRAN) 4 MG tablet, Take 1 tablet (4 mg total) by mouth daily as needed for nausea or vomiting., Disp: 30 tablet, Rfl: 1   pantoprazole (PROTONIX) 40 MG tablet, TAKE 1 TABLET BY MOUTH DAILY, Disp: 30 tablet, Rfl: 1   potassium chloride SA (KLOR-CON M) 20 MEQ tablet, TAKE 1 TABLET BY MOUTH EVERY DAY AS NEEDED (when taking torsemide), Disp: 90 tablet, Rfl: 3   pregabalin (LYRICA) 150 MG capsule, TAKE ONE CAPSULE BY MOUTH TWICE DAILY, Disp: 60 capsule, Rfl: 5   ranolazine (RANEXA) 1000 MG SR tablet, TAKE 1 TABLET BY MOUTH TWICE DAILY,  Disp: 180 tablet, Rfl: 3   REPATHA SURECLICK [id number] MG/ML SOAJ, INJECT 140 MG INTO THE SKIN EVERY 14 DAYS, Disp: 2 mL, Rfl: 6   rosuvastatin (CRESTOR) 40 MG tablet, TAKE 1 TABLET BY MOUTH DAILY, Disp: 30 tablet, Rfl: 0   Semaglutide, 1 MG/DOSE, 4 MG/3ML SOPN, Inject 1 mg as directed once a week. To REPLACE 0.24m Ozempic, Disp: 9 mL, Rfl: 3   torsemide (DEMADEX) 20 MG tablet, TAKE 2 TABLETS BY MOUTH DAILY AS NEEDED FOR swelling, Disp: 180 tablet, Rfl: 3   traMADol (ULTRAM) 50 MG tablet, TAKE 2 TABLETS BY MOUTH TWICE DAILY, Disp: 120 tablet, Rfl: 5  Current Facility-Administered Medications:     lidocaine (XYLOCAINE) 1 % (with pres) injection 6 mL, 6 mL, Other, Once, Lovorn, Megan, MD   Allergies: Allergies  Allergen Reactions   Bee Venom Anaphylaxis and Swelling   Latex Itching   Misc. Sulfonamide Containing Compounds Nausea And Vomiting   Sulfa Drugs Cross Reactors Nausea And Vomiting    REVIEW OF SYSTEMS:   Review of Systems  Constitutional:  Negative for chills, fatigue and fever.  HENT:   Negative for lump/mass, mouth sores, nosebleeds, sore throat and trouble swallowing.   Eyes:  Negative for eye problems.  Respiratory:  Negative for cough and shortness of breath.   Cardiovascular:  Positive for chest pain and palpitations. Negative for leg swelling.  Gastrointestinal:  Positive for diarrhea and nausea. Negative for abdominal pain, constipation and vomiting.  Genitourinary:  Negative for bladder incontinence, difficulty urinating, dysuria, frequency, hematuria and nocturia.   Musculoskeletal:  Negative for arthralgias, back pain, flank pain, myalgias and neck pain.  Skin:  Negative for itching and rash.  Neurological:  Positive for dizziness (occasionally), headaches (Migraines) and numbness (In the arms right is worse).  Hematological:  Does not bruise/bleed easily.  Psychiatric/Behavioral:  Positive for depression and sleep disturbance. Negative for suicidal ideas. The patient is nervous/anxious.   All other systems reviewed and are negative.    VITALS:   There were no vitals taken for this visit.  Wt Readings from Last 3 Encounters:  10/20/22 (!) 369 lb (167.4 kg)  09/14/22 (!) 371 lb 9.6 oz (168.6 kg)  07/19/22 (!) 361 lb 6.4 oz (163.9 kg)    There is no height or weight on file to calculate BMI.   PHYSICAL EXAM:   Physical Exam Vitals and nursing note reviewed. Exam conducted with a chaperone present.  Constitutional:      Appearance: Normal appearance.  Cardiovascular:     Rate and Rhythm: Normal rate and regular rhythm.     Pulses: Normal pulses.      Heart sounds: Normal heart sounds.  Pulmonary:     Effort: Pulmonary effort is normal.     Breath sounds: Normal breath sounds.  Abdominal:     Palpations: Abdomen is soft. There is no hepatomegaly, splenomegaly or mass.     Tenderness: There is no abdominal tenderness.  Musculoskeletal:     Right lower leg: No edema.     Left lower leg: No edema.  Lymphadenopathy:     Cervical: No cervical adenopathy.     Right cervical: No superficial, deep or posterior cervical adenopathy.    Left cervical: No superficial, deep or posterior cervical adenopathy.     Upper Body:     Right upper body: No supraclavicular or axillary adenopathy.     Left upper body: No supraclavicular or axillary adenopathy.  Neurological:     General: No focal  deficit present.     Mental Status: She is alert and oriented to person, place, and time.  Psychiatric:        Mood and Affect: Mood normal.        Behavior: Behavior normal.     LABS:      Latest Ref Rng & Units 10/20/2022    8:21 AM 04/12/2022   11:33 AM 12/03/2020   10:55 AM  CBC  WBC 3.4 - 10.8 x10E3/uL 13.1  12.5  10.9   Hemoglobin 11.1 - 15.9 g/dL 13.6  14.0  14.6   Hematocrit 34.0 - 46.6 % 40.4  40.4  42.9   Platelets 150 - 450 x10E3/uL 297  337  280       Latest Ref Rng & Units 10/20/2022    8:21 AM 04/21/2022   11:43 AM 04/12/2022   11:33 AM  CMP  Glucose 70 - 99 mg/dL 117  121  114   BUN 6 - 24 mg/dL 11  8  11   $ Creatinine 0.57 - 1.00 mg/dL 0.74  0.68  0.84   Sodium 134 - 144 mmol/L 136  139  136   Potassium 3.5 - 5.2 mmol/L 4.2  3.4  3.9   Chloride 96 - 106 mmol/L 103  102  107   CO2 20 - 29 mmol/L 18  22  16   $ Calcium 8.7 - 10.2 mg/dL 9.1  9.8  9.3   Total Protein 6.0 - 8.5 g/dL 6.6     Total Bilirubin 0.0 - 1.2 mg/dL 0.3     Alkaline Phos 44 - 121 IU/L 69     AST 0 - 40 IU/L 13     ALT 0 - 32 IU/L 10        No results found for: "CEA1", "CEA" / No results found for: "CEA1", "CEA" No results found for: "PSA1" No results  found for: "[id number]" No results found for: "CAN125"  No results found for: "TOTALPROTELP", "ALBUMINELP", "A1GS", "A2GS", "BETS", "BETA2SER", "GAMS", "MSPIKE", "SPEI" No results found for: "TIBC", "FERRITIN", "IRONPCTSAT" No results found for: "LDH"   STUDIES:   No results found.

## 2022-11-12 ENCOUNTER — Encounter: Payer: Self-pay | Admitting: Hematology

## 2022-11-12 ENCOUNTER — Inpatient Hospital Stay: Payer: Medicaid Other | Attending: Hematology | Admitting: Hematology

## 2022-11-12 ENCOUNTER — Inpatient Hospital Stay: Payer: Medicaid Other

## 2022-11-12 VITALS — BP 116/75 | HR 93 | Temp 98.2°F | Resp 18 | Ht 70.5 in | Wt 367.0 lb

## 2022-11-12 DIAGNOSIS — I252 Old myocardial infarction: Secondary | ICD-10-CM | POA: Diagnosis not present

## 2022-11-12 DIAGNOSIS — D72829 Elevated white blood cell count, unspecified: Secondary | ICD-10-CM

## 2022-11-12 DIAGNOSIS — F17210 Nicotine dependence, cigarettes, uncomplicated: Secondary | ICD-10-CM

## 2022-11-12 DIAGNOSIS — I509 Heart failure, unspecified: Secondary | ICD-10-CM | POA: Diagnosis not present

## 2022-11-12 DIAGNOSIS — Z79899 Other long term (current) drug therapy: Secondary | ICD-10-CM | POA: Diagnosis not present

## 2022-11-12 DIAGNOSIS — Z803 Family history of malignant neoplasm of breast: Secondary | ICD-10-CM

## 2022-11-12 DIAGNOSIS — E1140 Type 2 diabetes mellitus with diabetic neuropathy, unspecified: Secondary | ICD-10-CM

## 2022-11-12 DIAGNOSIS — Z801 Family history of malignant neoplasm of trachea, bronchus and lung: Secondary | ICD-10-CM | POA: Insufficient documentation

## 2022-11-12 DIAGNOSIS — Z8616 Personal history of COVID-19: Secondary | ICD-10-CM

## 2022-11-12 DIAGNOSIS — Z808 Family history of malignant neoplasm of other organs or systems: Secondary | ICD-10-CM | POA: Diagnosis not present

## 2022-11-12 DIAGNOSIS — Z8041 Family history of malignant neoplasm of ovary: Secondary | ICD-10-CM | POA: Diagnosis not present

## 2022-11-12 DIAGNOSIS — Z955 Presence of coronary angioplasty implant and graft: Secondary | ICD-10-CM | POA: Diagnosis not present

## 2022-11-12 LAB — CBC WITH DIFFERENTIAL/PLATELET
Abs Immature Granulocytes: 0.08 10*3/uL — ABNORMAL HIGH (ref 0.00–0.07)
Basophils Absolute: 0 10*3/uL (ref 0.0–0.1)
Basophils Relative: 0 %
Eosinophils Absolute: 0.1 10*3/uL (ref 0.0–0.5)
Eosinophils Relative: 1 %
HCT: 40.3 % (ref 36.0–46.0)
Hemoglobin: 13.8 g/dL (ref 12.0–15.0)
Immature Granulocytes: 1 %
Lymphocytes Relative: 18 %
Lymphs Abs: 2.8 10*3/uL (ref 0.7–4.0)
MCH: 30.7 pg (ref 26.0–34.0)
MCHC: 34.2 g/dL (ref 30.0–36.0)
MCV: 89.6 fL (ref 80.0–100.0)
Monocytes Absolute: 0.7 10*3/uL (ref 0.1–1.0)
Monocytes Relative: 4 %
Neutro Abs: 11.5 10*3/uL — ABNORMAL HIGH (ref 1.7–7.7)
Neutrophils Relative %: 76 %
Platelets: 304 10*3/uL (ref 150–400)
RBC: 4.5 MIL/uL (ref 3.87–5.11)
RDW: 12.8 % (ref 11.5–15.5)
WBC: 15.2 10*3/uL — ABNORMAL HIGH (ref 4.0–10.5)
nRBC: 0 % (ref 0.0–0.2)

## 2022-11-12 LAB — SEDIMENTATION RATE: Sed Rate: 38 mm/hr — ABNORMAL HIGH (ref 0–22)

## 2022-11-12 LAB — C-REACTIVE PROTEIN: CRP: 0.8 mg/dL (ref <1.0)

## 2022-11-12 LAB — LACTATE DEHYDROGENASE: LDH: 121 U/L (ref 98–192)

## 2022-11-12 NOTE — Patient Instructions (Addendum)
[facility]  Discharge Instructions  You were seen and examined today by Dr. Delton Coombes. Dr. Delton Coombes is a hematologist, meaning that he specializes in blood abnormalities. Dr. Delton Coombes discussed your past medical history, family history of cancers/blood conditions and the events that led to you being here today.  You were referred to Dr. Delton Coombes due to elevated white blood cells. White blood cells are typically elevated in the presence of infection, due to medications, or other chronic conditions.  Dr. Delton Coombes has recommended additional labs today for further evaluation.  Follow-up as scheduled.  Thank you for choosing [facility] to provide your oncology and hematology care.   To afford each patient quality time with our provider, please arrive at least 15 minutes before your scheduled appointment time. You may need to reschedule your appointment if you arrive late (10 or more minutes). Arriving late affects you and other patients whose appointments are after yours.  Also, if you miss three or more appointments without notifying the office, you may be dismissed from the clinic at the provider's discretion.    Again, thank you for choosing [facility].  Our hope is that these requests will decrease the amount of time that you wait before being seen by our physicians.   If you have a lab appointment with the [facility] please come in thru the Main Entrance and check in at the main information desk.           _____________________________________________________________  Should you have questions after your visit to [facility], please contact our office at [phone] and follow the prompts.  Our office hours are 8:00 a.m. to 4:30 p.m. Monday - Thursday and 8:00 a.m. to 2:30 p.m. Friday.  Please note that voicemails left after 4:00 p.m. may not be returned until the following business day.  We are  closed weekends and all major holidays.  You do have access to a nurse 24-7, just call the main number to the clinic [phone] and do not press any options, hold on the line and a nurse will answer the phone.    For prescription refill requests, have your pharmacy contact our office and allow 72 hours.    Masks are optional in the cancer centers. If you would like for your care team to wear a mask while they are taking care of you, please let them know. You may have one support person who is at least 44 years old accompany you for your appointments.

## 2022-11-14 LAB — RHEUMATOID FACTOR: Rheumatoid fact SerPl-aCnc: <10 IU/mL (ref <14.0)

## 2022-11-15 ENCOUNTER — Other Ambulatory Visit: Payer: Self-pay | Admitting: Physical Medicine and Rehabilitation

## 2022-11-16 LAB — ANTINUCLEAR ANTIBODIES, IFA: ANA Ab, IFA: NEGATIVE

## 2022-11-17 ENCOUNTER — Ambulatory Visit: Payer: Medicaid Other | Admitting: Cardiology

## 2022-11-17 LAB — BCR-ABL1 FISH
Cells Analyzed: 200
Cells Counted: 200

## 2022-11-21 NOTE — Progress Notes (Unsigned)
[name], female    DOB: 09/29/1980,     MRN: [id number]   Brief patient profile:  73 yowf active smoker(cigs to vapes 05/2021)   referred to pulmonary clinic in [place]  06/18/2021 by Dr  [name] for sob s/p covid July 2022      History of Present Illness  06/18/2021  Pulmonary/ 1st office eval/ [name] / [name]  Chief Complaint  Patient presents with   Consult    Lingering Sob after covid. Approx. 2 months. Pt states its the worst after walking or activity/exertion. Laying on back makes it hard to breathe as well. Cough isnt normally productive but pt says she has not had a lot of mucus- Clear/yellow/brown.   Baseline = April 2022 at wt present 425 lb > used HC parking due back/ neuropathy could do shopping pushing cart  Dyspnea:  50 ft flt since July 2022 covid  / better if uses saba  Cough: variable / miminal mucoid  Sleep: props [place] bunch of pillows x years  SABA use: some better p rx but doesn't really pre or rechallenge Rec Plan A = Automatic = Always=    Breztri Take 2 puffs first thing in am and then another 2 puffs about 12 hours later.   Work on inhaler technique:   Call if you really do better on breztri than the other ones you've used and we'll see if we can get insurance to pay for it or similar drug  Plan B = Backup (to supplement plan A, not to replace it) Only use your albuterol inhaler as a rescue medication  The key is to stop smoking completely before smoking completely stops you!   PFTs nl ? P breztri 08/12/21 ERV 21% at wt 386   08/17/2021  f/u ov/[place] office/[name] re: doe/ MO /smoker maint on symbicort 160 Dyspnea:  walking across the street to take care of couple has to stop 3 times /  Cough: p vapes  Sleeping: 45 degrees x 2017  SABA use: worse since ran out / did not  need on breztri 02: none Covid status: reactin to first shot  and omicron summer 2022 Rec We will try a PA for your [name] Stop carevidool and start bisoprolol 5  mg take one twice daily (take two every 12 hours if not satisfied)  In meantime: Plan A = Automatic = Always=    Symbicort 160 Take 2 puffs first thing in am and then another 2 puffs about 12 hours later.  Work on inhaler technique:   Plan B = Backup (to supplement plan A, not to replace it) Only use your albuterol inhaler as a rescue medication  Ok to try albuterol 15 min before an activity (on alternating days)  that you know would usually make you short of breath  Please schedule a follow up visit in 6  months but call sooner if needed        11/22/2022  f/u ov/[place] office/[name] re: *** maint on ***  No chief complaint on file.   Dyspnea:  *** Cough: *** Sleeping: *** SABA use: *** 02: *** Covid status: *** Lung cancer screening: ***   No obvious day to day or daytime variability or assoc excess/ purulent sputum or mucus plugs or hemoptysis or cp or chest tightness, subjective wheeze or overt sinus or hb symptoms.   *** without nocturnal  or early am exacerbation  of respiratory  c/o's or need for noct saba. Also denies any obvious  fluctuation of symptoms with weather or environmental changes or other aggravating or alleviating factors except as outlined above   No unusual exposure hx or h/o childhood pna/ asthma or knowledge of premature birth.  Current Allergies, Complete Past Medical History, Past Surgical History, Family History, and Social History were reviewed in [employer] record.  ROS  The following are not active complaints unless bolded Hoarseness, sore throat, dysphagia, dental problems, itching, sneezing,  nasal congestion or discharge of excess mucus or purulent secretions, ear ache,   fever, chills, sweats, unintended wt loss or wt gain, classically pleuritic or exertional cp,  orthopnea pnd or arm/hand swelling  or leg swelling, presyncope, palpitations, abdominal pain, anorexia, nausea, vomiting, diarrhea  or change in bowel habits or  change in bladder habits, change in stools or change in urine, dysuria, hematuria,  rash, arthralgias, visual complaints, headache, numbness, weakness or ataxia or problems with walking or coordination,  change in mood or  memory.        No outpatient medications have been marked as taking for the 11/22/22 encounter (Appointment) with Katelyn Rockers, MD.   Current Facility-Administered Medications for the 11/22/22 encounter (Appointment) with Katelyn Rockers, MD  Medication   lidocaine (XYLOCAINE) 1 % (with pres) injection 6 mL            Past Medical History:  Diagnosis Date   Allergy    Anemia    Anginal pain ([place])    Anxiety    CHF (congestive heart failure) ([place])    Chronic kidney disease    history of kidney stones   Coronary artery disease 09/2011   a.s/p BMS to LAD and angioplasty of RI in 09/2011 b. patent stent by cath in 02/2012, 12/2013, and 02/2016 with most recent showing 30-40% dLAD and 30-40% RCA stenosis   Depression    Diabetes mellitus    Dyspnea    Hyperlipidemia    Hypertension    Midsternal chest pain 03/03/2012   Migraines    Morbid obesity ([place])    Myocardial infarct ([place]) 09/2011   Myocardial infarction ([place]) 02/2016   Neuromuscular disorder ([place])    Palpitations 02/17/2012   Ureteral stone with hydronephrosis    Urinary tract obstruction due to kidney stone 02/15/2017       Objective:    11/22/2022           ***   08/17/21 (!) 393 lb (178.3 kg)  06/18/21 (!) 392 lb 1.3 oz (177.8 kg)  06/08/21 (!) 389 lb (176.4 kg)    Vital signs reviewed  11/22/2022  - Note at rest 02 sats  ***% on ***   General appearance:    ***              Assessment

## 2022-11-22 ENCOUNTER — Encounter: Payer: Self-pay | Admitting: Internal Medicine

## 2022-11-22 ENCOUNTER — Other Ambulatory Visit: Payer: Self-pay | Admitting: Family Medicine

## 2022-11-22 ENCOUNTER — Ambulatory Visit: Payer: Medicaid Other | Admitting: Internal Medicine

## 2022-11-22 VITALS — BP 128/72 | HR 90 | Ht 70.5 in | Wt 373.0 lb

## 2022-11-22 DIAGNOSIS — E7800 Pure hypercholesterolemia, unspecified: Secondary | ICD-10-CM

## 2022-11-22 DIAGNOSIS — J4489 Other specified chronic obstructive pulmonary disease: Secondary | ICD-10-CM | POA: Diagnosis not present

## 2022-11-22 DIAGNOSIS — F17210 Nicotine dependence, cigarettes, uncomplicated: Secondary | ICD-10-CM | POA: Diagnosis not present

## 2022-11-22 DIAGNOSIS — I214 Non-ST elevation (NSTEMI) myocardial infarction: Secondary | ICD-10-CM

## 2022-11-22 LAB — CALR +MPL + E12-E15  (REFLEX)

## 2022-11-22 LAB — JAK2 V617F RFX CALR/MPL/E12-15

## 2022-11-22 NOTE — Assessment & Plan Note (Signed)
Active smoker - PFT's  08/12/21 complete reversible airflow obst - d/c coreg 08/17/2021  - 11/22/2022  After extensive coaching inhaler device,  effectiveness =  80%     Prior to outdoor smoker exp  despite active > All goals of chronic asthma control met including optimal function and elimination of symptoms with minimal need for rescue therapy.  Contingencies discussed in full including contacting this office immediately if not controlling the symptoms using the rule of two's.     Airways are a bit irritated today but she declined pred rx as very high sugars with last course so rec stay on symbicort 160 and continue prn saba up to q 4 h and call if not improving by end of week (5 days)

## 2022-11-22 NOTE — Assessment & Plan Note (Signed)
Counseled re importance of smoking cessation but did not meet time criteria for separate billing      F/u q6 m prn       Each maintenance medication was reviewed in detail including emphasizing most importantly the difference between maintenance and prns and under what circumstances the prns are to be triggered using an action plan format where appropriate.  Total time for H and P, chart review, counseling, reviewing hfa  device(s) and generating customized AVS unique to this office visit / same day charting = 30 min

## 2022-11-22 NOTE — Patient Instructions (Signed)
Plan A = Automatic = Always=   Symbicort 160 Take 2 puffs first thing in am and then another 2 puffs about 12 hours later.    Work on inhaler technique:  relax and gently blow all the way out then take a nice smooth full deep breath back in, triggering the inhaler at same time you start breathing in.  Hold breath in for at least  5 seconds if you can. Blow out symbicort  thru nose. Rinse and gargle with water when done.  If mouth or throat bother you at all,  try brushing teeth/gums/tongue with arm and hammer toothpaste/ make a slurry and gargle and spit out.     Plan B = Backup (to supplement plan A, not to replace it) Only use your albuterol inhaler as a rescue medication  Ok to try albuterol 15 min before an activity (on alternating days)  that you know would usually make you short of breath     Please schedule a follow up visit in 6  months but call sooner if needed

## 2022-11-24 ENCOUNTER — Other Ambulatory Visit: Payer: Self-pay | Admitting: Family Medicine

## 2022-11-24 DIAGNOSIS — F418 Other specified anxiety disorders: Secondary | ICD-10-CM

## 2022-11-24 DIAGNOSIS — E1169 Type 2 diabetes mellitus with other specified complication: Secondary | ICD-10-CM

## 2022-11-24 DIAGNOSIS — F411 Generalized anxiety disorder: Secondary | ICD-10-CM

## 2022-12-14 ENCOUNTER — Ambulatory Visit: Payer: Medicaid Other | Admitting: Physician Assistant

## 2022-12-20 ENCOUNTER — Encounter: Payer: Self-pay | Admitting: Physical Medicine and Rehabilitation

## 2022-12-20 ENCOUNTER — Encounter
Payer: Medicaid Other | Attending: Physical Medicine and Rehabilitation | Admitting: Physical Medicine and Rehabilitation

## 2022-12-20 VITALS — BP 118/79 | HR 83 | Temp 98.8°F | Ht 70.5 in | Wt 372.0 lb

## 2022-12-20 DIAGNOSIS — Z5181 Encounter for therapeutic drug level monitoring: Secondary | ICD-10-CM | POA: Diagnosis not present

## 2022-12-20 DIAGNOSIS — M7918 Myalgia, other site: Secondary | ICD-10-CM | POA: Diagnosis not present

## 2022-12-20 DIAGNOSIS — G894 Chronic pain syndrome: Secondary | ICD-10-CM

## 2022-12-20 DIAGNOSIS — Z79891 Long term (current) use of opiate analgesic: Secondary | ICD-10-CM

## 2022-12-20 DIAGNOSIS — M5442 Lumbago with sciatica, left side: Secondary | ICD-10-CM | POA: Diagnosis not present

## 2022-12-20 DIAGNOSIS — G8929 Other chronic pain: Secondary | ICD-10-CM

## 2022-12-20 DIAGNOSIS — E1140 Type 2 diabetes mellitus with diabetic neuropathy, unspecified: Secondary | ICD-10-CM | POA: Diagnosis not present

## 2022-12-20 DIAGNOSIS — M797 Fibromyalgia: Secondary | ICD-10-CM

## 2022-12-20 DIAGNOSIS — M5441 Lumbago with sciatica, right side: Secondary | ICD-10-CM | POA: Insufficient documentation

## 2022-12-20 MED ORDER — LIDOCAINE HCL 1 % IJ SOLN
9.0000 mL | Freq: Once | INTRAMUSCULAR | Status: AC
  Administered 2022-12-20: 9 mL via INTRADERMAL

## 2022-12-20 MED ORDER — TRAMADOL HCL 50 MG PO TABS: 100.0000 mg | ORAL_TABLET | Freq: Two times a day (BID) | ORAL | 5 refills | Status: DC

## 2022-12-20 MED ORDER — PREGABALIN 150 MG PO CAPS: 150.0000 mg | ORAL_CAPSULE | Freq: Two times a day (BID) | ORAL | 5 refills | Status: DC

## 2022-12-20 NOTE — Addendum Note (Signed)
Addended by: Jasmine December T on: 12/20/2022 10:12 AM   Modules accepted: Orders

## 2022-12-20 NOTE — Progress Notes (Signed)
Pt is a 42 yr old female with BMI of 56- weight 390 lbs, DM, GAD, HTN,  and  CHF- chronic pain- Appears to have L T8 foraminal thoracic stenosis.  As well as fibromyalgia and myofascial pain syndrome.       Here for f/u of pain and trigger point injections.    Broke a tooth the other day.  Teeth starting to deteriorate since starting to take meds 11 years ago- Face has swollen on R side of face.   Took some ibuprofen- (knows not supposed to take) 400 mg x1 to lower swelling in lip- so could be understood.   To go to urgent care today and get referral to dentist.   Lost insurance in September, so couldn't come see me-  And had to cancel appointment- 3 weeks ago and couldn't wait to see me.  Got Medicaid back. Had to prove mobile home was unlivable and show Medicaid mortgage of new home she bought.   Cannot get arms above head to do hair color.  Also cannot turn head to look for traffic.   Taking something with CBD oil- works for arthritis pain since cannot take Ibuprofen- stopped taking.   Plan:  Try to get carpal tunnel braces form [facility] for hands going to sleep.   2. Patient here for trigger point injections for myofascial pain/fibromyalgia  Consent done and on chart.  Cleaned areas with alcohol and injected using a 27 gauge 1.5 inch needle  Injected 8 cc- wasted 1cc Using 1% Lidocaine with no EPI  Upper traps B/L  Levators B/L Posterior scalenes Middle scalenes- B/L  Splenius Capitus Pectoralis Major- B/L  Rhomboids- B/L  x3 Infraspinatus Teres Major/minor Thoracic paraspinals B/L  Lumbar paraspinals B/L x2 Other injections- B/L forearms   Patient's level of pain prior was- 8/10 Current level of pain after injections is-can now turn head- down to 5/10- tolerable now  There was no bleeding or complications.  Patient was advised to drink a lot of water on day after injections to flush system Will have increased soreness for 12-48 hours after injections.   Can use Lidocaine patches the day AFTER injections Can use theracane on day of injections in places didn't inject Can use heating pad 4-6 hours AFTER injections   3. Con't Tramadol- will write for refills 5 refills.    4. Needs UDS- needs it done today- has been 1+ year.   5. Pregabalin/Lyrica- 150 mg BID- con't with refills.   6. F/U q2 months- for Trigger point injections-   7. Working on disability due to Cardiac issues.   I spent a total of 32   minutes on total care today- >50% coordination of care- due to 10 minutes on trigger point injections- and 22 minutes on discussing Pain meds, UDS, and Carpal tunnel syndrome.  Also discussed tooth fractures and how to get treated

## 2022-12-20 NOTE — Patient Instructions (Signed)
Plan:  Try to get carpal tunnel braces form [facility] for hands going to sleep.   2. Patient here for trigger point injections for myofascial pain/fibromyalgia  Consent done and on chart.  Cleaned areas with alcohol and injected using a 27 gauge 1.5 inch needle  Injected 8 cc- wasted 1cc Using 1% Lidocaine with no EPI  Upper traps B/L  Levators B/L Posterior scalenes Middle scalenes- B/L  Splenius Capitus Pectoralis Major- B/L  Rhomboids- B/L  x3 Infraspinatus Teres Major/minor Thoracic paraspinals B/L  Lumbar paraspinals B/L x2 Other injections- B/L forearms   Patient's level of pain prior was- 8/10 Current level of pain after injections is-can now turn head- down to 5/10- tolerable now  There was no bleeding or complications.  Patient was advised to drink a lot of water on day after injections to flush system Will have increased soreness for 12-48 hours after injections.  Can use Lidocaine patches the day AFTER injections Can use theracane on day of injections in places didn't inject Can use heating pad 4-6 hours AFTER injections   3. Con't Tramadol- will write for refills 5 refills.    4. Needs UDS- needs it done today- has been 1+ year.   5. Pregabalin/Lyrica- 150 mg BID- con't with refills.   6. F/U  q2 months- for Trigger point injections-

## 2022-12-22 NOTE — Progress Notes (Unsigned)
[name], [place] [address]   CLINIC:  Medical Oncology/Hematology  PCP:  Janora Norlander, DO [place] [address] [phone]   REASON FOR VISIT:  Follow-up for neutrophilic leukocytosis  PRIOR THERAPY: None  CURRENT THERAPY: Under workup  INTERVAL HISTORY:   [name] 42 y.o. female returns for routine follow-up of her neutrophilic leukocytosis.  At today's visit, she reports feeling fairly well.  No recent hospitalizations, surgeries, or changes in baseline health status.   She takes Symbicort twice daily, but no other steroid medications.  No history of connective tissue disorder or chronic inflammatory disease.  She denies any recent infections, but reportedly has UTIs once or twice each year.  She denies any B symptoms.   No new masses or lymphadenopathy.  She continues to smoke 0.25 PPD cigarettes, but is vaping instead.  She has 75% energy and 75% appetite. She endorses that she is maintaining a stable weight.  ASSESSMENT & PLAN:  1.  Leukocytosis, neutrophil predominant - Intermittent leukocytosis since at least 2013, persistent since January 2022.  Neutrophil predominant. - No recurrent infections.  Occasional UTI once or twice per year. - No fevers or night sweats.  Lost 65 pounds in the last 1 year while being on Ozempic. - She uses Symbicort inhaler twice daily, no other steroids   - CTAP (05/18/2020): Spleen normal.  Fatty liver seen. - She smokes 0.5 PPD cigarettes x 30 years. - down to 0.25 PPD, but vaping instead  - She is morbidly obese (BMI 52.62) - Hematology workup (11/12/2022): JAK2, CALR, MPL, Exons 12-15 negative. BCR/ABL FISH negative. Elevated ESR 38.  Normal CRP, RF, ANA, LDH. - Most recent CBC/differential (11/12/2022): WBC 15.2/ANC 11.5, otherwise normal CBC. - No lymphadenopathy palpated on exam.   - DIFFERENTIAL DIAGNOSIS favors reactive leukocytosis in the setting of tobacco use and morbid  obesity - PLAN: CBC/differential only in 6 months.  Labs (CBC/D, ESR, CRP, LDH) and office visit in 1 year.  2.  Social/family history: - She lives at home with her husband.  She is currently not working and is trying to get on disability.  She used to work on cars, Geophysical data processor. - Current active smoker, half pack per day for 30 years. - No family history of leukemia. - Maternal grandmother and maternal great grandmother had breast cancer.  Mother had breast cancer.  Father had throat cancer.  Paternal grandfather had lung cancer.  PLAN SUMMARY: >> Labs only in 6 months = CBC/differential >> Labs in 1 year = CBC/differential, ESR, CRP, LDH >> OFFICE visit in 1 year     REVIEW OF SYSTEMS:   Review of Systems  Constitutional:  Positive for fatigue. Negative for appetite change, chills, diaphoresis, fever and unexpected weight change.  HENT:   Negative for lump/mass and nosebleeds.   Eyes:  Negative for eye problems.  Respiratory:  Negative for cough, hemoptysis and shortness of breath.   Cardiovascular:  Negative for chest pain, leg swelling and palpitations.  Gastrointestinal:  Negative for abdominal pain, blood in stool, constipation, diarrhea, nausea and vomiting.  Genitourinary:  Negative for hematuria.   Skin: Negative.   Neurological:  Positive for numbness. Negative for dizziness, headaches and light-headedness.  Hematological:  Does not bruise/bleed easily.  Psychiatric/Behavioral:  Positive for sleep disturbance.      PHYSICAL EXAM:  ECOG PERFORMANCE STATUS: 0 - Asymptomatic  There were no vitals filed for this visit. There were no  vitals filed for this visit. Physical Exam Constitutional:      Appearance: Normal appearance. She is morbidly obese.  Cardiovascular:     Heart sounds: Normal heart sounds.  Pulmonary:     Breath sounds: Normal breath sounds. Decreased air movement present.  Neurological:     General: No focal deficit present.     Mental  Status: Mental status is at baseline.  Psychiatric:        Behavior: Behavior normal. Behavior is cooperative.    PAST MEDICAL/SURGICAL HISTORY:  Past Medical History:  Diagnosis Date   Allergy    Anemia    Anginal pain (HCC)    Anxiety    Arthritis    CHF (congestive heart failure) (HCC)    Chronic asthmatic bronchitis 08/17/2021   Chronic kidney disease    history of kidney stones   Coronary artery disease 09/2011   a.s/p BMS to LAD and angioplasty of RI in 09/2011 b. patent stent by cath in 02/2012, 12/2013, and 02/2016 with most recent showing 30-40% dLAD and 30-40% RCA stenosis   Depression    Diabetes mellitus    Dyspnea    GERD (gastroesophageal reflux disease)    History of kidney stones    Hyperlipidemia    Hypertension    Midsternal chest pain 03/03/2012   Migraines    Morbid obesity ([place])    Myocardial infarct ([place]) 09/2011   Myocardial infarction ([place]) 02/2016   Neuromuscular disorder ([place])    Palpitations 02/17/2012   Ureteral stone with hydronephrosis    Urinary tract obstruction due to kidney stone 02/15/2017   Past Surgical History:  Procedure Laterality Date   BIOPSY  10/27/2020   Procedure: BIOPSY;  Surgeon: Daneil Dolin, MD;  [facility]: [facility];  Service: Endoscopy;;   CARDIAC CATHETERIZATION  10/08/2011   LAD: 95% mid, Ramus: 95% ostial   CARPAL TUNNEL RELEASE     CESAREAN SECTION     CHOLECYSTECTOMY     CORONARY ANGIOPLASTY WITH STENT PLACEMENT  10/08/2011   LAD: BMS, Ramus: cutting balloon angioplasty   CORONARY STENT INTERVENTION N/A 01/14/2020   Procedure: CORONARY STENT INTERVENTION;  Surgeon: Leonie Man, MD;  Location: [facility];  Service: Cardiovascular;  Laterality: N/A;   CYSTOSCOPY WITH RETROGRADE PYELOGRAM, URETEROSCOPY AND STENT PLACEMENT Left 02/16/2017   Procedure: CYSTOSCOPY WITH RETROGRADE PYELOGRAM, URETEROSCOPY AND STENT PLACEMENT;  Surgeon: Kathie Rhodes, MD;  Location: [facility];  Service: Urology;   Laterality: Left;   CYSTOSCOPY WITH RETROGRADE PYELOGRAM, URETEROSCOPY AND STENT PLACEMENT Left 03/28/2017   Procedure: LEFT STENT REMOVAL LEFT RETROGRADE PYELOGRAM LEFT URETEROSCOPY   LASER LITHOTRIPSY  AND  STENT REPLACEMENT;  Surgeon: Cleon Gustin, MD;  Location: [facility];  Service: Urology;  Laterality: Left;   CYSTOSCOPY WITH RETROGRADE PYELOGRAM, URETEROSCOPY AND STENT PLACEMENT Left 04/13/2022   Procedure: CYSTOSCOPY WITH RETROGRADE PYELOGRAM, URETEROSCOPY AND STENT PLACEMENT, BASKET EXTRACTION;  Surgeon: Cleon Gustin, MD;  Location: [facility];  Service: Urology;  Laterality: Left;   ESOPHAGOGASTRODUODENOSCOPY (EGD) WITH PROPOFOL N/A 10/27/2020   Rourk: mild erosive reflux esophagitis, h.pylori gastritis   LEFT HEART CATH AND CORONARY ANGIOGRAPHY N/A 08/15/2018   Procedure: LEFT HEART CATH AND CORONARY ANGIOGRAPHY;  Surgeon: Martinique, Peter M, MD;  Location: [facility];  Service: Cardiovascular;  Laterality: N/A;   LEFT HEART CATH AND CORONARY ANGIOGRAPHY N/A 01/14/2020   Procedure: LEFT HEART CATH AND CORONARY ANGIOGRAPHY;  Surgeon: Leonie Man, MD;  Location: [facility];  Service:  Cardiovascular;  Laterality: N/A;   LEFT HEART CATHETERIZATION WITH CORONARY ANGIOGRAM N/A 10/08/2011   Procedure: LEFT HEART CATHETERIZATION WITH CORONARY ANGIOGRAM;  Surgeon: Peter M Martinique, MD;  Location: [facility] CATH LAB;  Service: Cardiovascular;  Laterality: N/A;   LEFT HEART CATHETERIZATION WITH CORONARY ANGIOGRAM N/A 03/02/2012   Procedure: LEFT HEART CATHETERIZATION WITH CORONARY ANGIOGRAM;  Surgeon: Burnell Blanks, MD;  Location: [facility] CATH LAB;  Service: Cardiovascular;  Laterality: N/A;   LEFT HEART CATHETERIZATION WITH CORONARY ANGIOGRAM N/A 01/04/2014   Procedure: LEFT HEART CATHETERIZATION WITH CORONARY ANGIOGRAM;  Surgeon: Troy Sine, MD;  Location: [facility] CATH LAB;  Service: Cardiovascular;  Laterality: N/A;   right hand pinky surgery      SOCIAL HISTORY:  Social  History   Socioeconomic History   Marital status: Married    Spouse name: Barnabas Lister   Number of children: 2   Years of education: 10   Highest education level: Not on file  Occupational History   Not on file  Tobacco Use   Smoking status: Some Days    Packs/day: 1.00    Years: 30.00    Additional pack years: 0.00    Total pack years: 30.00    Types: Cigarettes    Start date: 03/18/1990   Smokeless tobacco: Never   Tobacco comments:    Vapes currently   Vaping Use   Vaping Use: Some days   Substances: Nicotine  Substance and Sexual Activity   Alcohol use: Not Currently    Alcohol/week: 0.0 standard drinks of alcohol    Comment: maybe once a year   Drug use: No   Sexual activity: Yes    Birth control/protection: None    Comment: PCOS  Other Topics Concern   Not on file  Social History Narrative   Unemployed   Applied for disability   Leisure "take care of house and kids"   Walks when able      She is working to get on disability. She previously worked on Special educational needs teacher.      Social Determinants of Health   Financial Resource Strain: Low Risk  (10/28/2017)   Overall Financial Resource Strain (CARDIA)    Difficulty of Paying Living Expenses: Not hard at all  Food Insecurity: No Food Insecurity (11/12/2022)   Hunger Vital Sign    Worried About Running Out of Food in the Last Year: Never true    Ran Out of Food in the Last Year: Never true  Transportation Needs: No Transportation Needs (11/12/2022)   PRAPARE - Hydrologist (Medical): No    Lack of Transportation (Non-Medical): No  Physical Activity: Inactive (10/28/2017)   Exercise Vital Sign    Days of Exercise per Week: 0 days    Minutes of Exercise per Session: 0 min  Stress: Stress Concern Present (10/28/2017)   [place]    Feeling of Stress : To some extent  Social Connections: Somewhat Isolated (10/28/2017)   Social  Connection and Isolation Panel [NHANES]    Frequency of Communication with Friends and Family: More than three times a week    Frequency of Social Gatherings with Friends and Family: More than three times a week    Attends Religious Services: Never    Marine scientist or Organizations: No    Attends Archivist Meetings: Never    Marital Status: Married  Human resources officer Violence: Not At Risk (11/12/2022)   Humiliation,  Afraid, Rape, and Kick questionnaire    Fear of Current or Ex-Partner: No    Emotionally Abused: No    Physically Abused: No    Sexually Abused: No    FAMILY HISTORY:  Family History  Problem Relation Age of Onset   Heart murmur Father    Diabetes Father    Hypertension Father    Hyperlipidemia Father    Cancer Father        throat   Diabetes Mother    Cancer Mother        breast.uterine, ovarian   Asthma Son    Appendicitis Son    Hernia Son    Mental illness Son        Bipolar, personality d/o   Stroke Maternal Grandmother    Cancer Maternal Grandmother        breast   Cancer Paternal Grandfather        lung   Colon cancer Neg Hx     CURRENT MEDICATIONS:  Outpatient Encounter Medications as of 12/23/2022  Medication Sig   Accu-Chek Softclix Lancets lancets CHECK BOOD SUGAR FOUR TIMES DAILY Dx E11.9   albuterol (VENTOLIN HFA) 108 (90 Base) MCG/ACT inhaler Inhale 2 puffs into the lungs every 4 (four) hours as needed for wheezing or shortness of breath.   amLODipine (NORVASC) 5 MG tablet Take 1 tablet (5 mg total) by mouth daily.   aspirin 81 MG chewable tablet Chew 81 mg by mouth daily.   bisoprolol (ZEBETA) 5 MG tablet One twice daily   blood glucose meter kit and supplies Dispense based on patient and insurance preference. Use up to four times daily as directed. (FOR ICD-10:  E11.22). Check BG daily   Blood Glucose Monitoring Suppl (ACCU-CHEK GUIDE) w/Device KIT Test BS 4 times daily Dx E11.9   budesonide-formoterol (SYMBICORT) 160-4.5  MCG/ACT inhaler Take 2 puffs first thing in am and then another 2 puffs about 12 hours later.   busPIRone (BUSPAR) 10 MG tablet TAKE 1 TABLET BY MOUTH TWICE DAILY   clopidogrel (PLAVIX) 75 MG tablet TAKE 1 TABLET BY MOUTH EVERY DAY   DULoxetine (CYMBALTA) 60 MG capsule Take 1 capsule (60 mg total) by mouth at bedtime.   ezetimibe (ZETIA) 10 MG tablet TAKE 1 TABLET BY MOUTH EVERY DAY   fenofibrate (TRICOR) 48 MG tablet TAKE 1 TABLET BY MOUTH DAILY   FLUoxetine (PROZAC) 20 MG capsule TAKE ONE CAPSULE BY MOUTH EVERY DAY. GIVE WITH CYMBALTA DUE TO NERVE PAIN AND ANXIETY/DEPRESSION   glucose blood (ACCU-CHEK GUIDE) test strip Test BS 4 times daily Dx E11.9   icosapent Ethyl (VASCEPA) 1 g capsule Take 2 capsules (2 g total) by mouth 2 (two) times daily.   losartan (COZAAR) 25 MG tablet TAKE 1 TABLET BY MOUTH DAILY   medroxyPROGESTERone (PROVERA) 5 MG tablet TAKE 1 TABLET BY MOUTH DAILY   metFORMIN (GLUCOPHAGE) 1000 MG tablet Take 1 tablet (1,000 mg total) by mouth 2 (two) times daily.   nitroGLYCERIN (NITROSTAT) 0.4 MG SL tablet DISSOLVE 1 TABLET UNDER THE TONGUE EVERY 5 MINUTES AS NEEDED FOR CHEST PAIN. DO NOT EXCEED A TOTAL OF 3 DOSES IN 15 MINUTES.   pantoprazole (PROTONIX) 40 MG tablet TAKE 1 TABLET BY MOUTH DAILY   potassium chloride SA (KLOR-CON M) 20 MEQ tablet TAKE 1 TABLET BY MOUTH EVERY DAY AS NEEDED (when taking torsemide)   pregabalin (LYRICA) 150 MG capsule Take 1 capsule (150 mg total) by mouth 2 (two) times daily.   ranolazine (RANEXA) 1000  MG SR tablet TAKE 1 TABLET BY MOUTH TWICE DAILY   REPATHA SURECLICK [id number] MG/ML SOAJ INJECT 140 MG INTO THE SKIN EVERY 14 DAYS   rosuvastatin (CRESTOR) 40 MG tablet TAKE 1 TABLET BY MOUTH DAILY   Semaglutide, 1 MG/DOSE, 4 MG/3ML SOPN Inject 1 mg as directed once a week. To REPLACE 0.5mg  Ozempic   torsemide (DEMADEX) 20 MG tablet TAKE 2 TABLETS BY MOUTH DAILY AS NEEDED FOR swelling   traMADol (ULTRAM) 50 MG tablet Take 2 tablets (100 mg total) by mouth  2 (two) times daily.   Facility-Administered Encounter Medications as of 12/23/2022  Medication   lidocaine (XYLOCAINE) 1 % (with pres) injection 6 mL    ALLERGIES:  Allergies  Allergen Reactions   Bee Venom Anaphylaxis and Swelling   Latex Itching   Misc. Sulfonamide Containing Compounds Nausea And Vomiting   Sulfa Drugs Cross Reactors Nausea And Vomiting    LABORATORY DATA:  I have reviewed the labs as listed.  CBC    Component Value Date/Time   WBC 15.2 (H) 11/12/2022 1002   RBC 4.50 11/12/2022 1002   HGB 13.8 11/12/2022 1002   HGB 13.6 10/20/2022 0821   HCT 40.3 11/12/2022 1002   HCT 40.4 10/20/2022 0821   PLT 304 11/12/2022 1002   PLT 297 10/20/2022 0821   MCV 89.6 11/12/2022 1002   MCV 92 10/20/2022 0821   MCH 30.7 11/12/2022 1002   MCHC 34.2 11/12/2022 1002   RDW 12.8 11/12/2022 1002   RDW 12.6 10/20/2022 0821   LYMPHSABS 2.8 11/12/2022 1002   LYMPHSABS 3.2 (H) 10/20/2022 0821   MONOABS 0.7 11/12/2022 1002   EOSABS 0.1 11/12/2022 1002   EOSABS 0.1 10/20/2022 0821   BASOSABS 0.0 11/12/2022 1002   BASOSABS 0.1 10/20/2022 0821      Latest Ref Rng & Units 10/20/2022    8:21 AM 04/21/2022   11:43 AM 04/12/2022   11:33 AM  CMP  Glucose 70 - 99 mg/dL 117  121  114   BUN 6 - 24 mg/dL 11  8  11    Creatinine 0.57 - 1.00 mg/dL 0.74  0.68  0.84   Sodium 134 - 144 mmol/L 136  139  136   Potassium 3.5 - 5.2 mmol/L 4.2  3.4  3.9   Chloride 96 - 106 mmol/L 103  102  107   CO2 20 - 29 mmol/L 18  22  16    Calcium 8.7 - 10.2 mg/dL 9.1  9.8  9.3   Total Protein 6.0 - 8.5 g/dL 6.6     Total Bilirubin 0.0 - 1.2 mg/dL 0.3     Alkaline Phos 44 - 121 IU/L 69     AST 0 - 40 IU/L 13     ALT 0 - 32 IU/L 10       DIAGNOSTIC IMAGING:  I have independently reviewed the relevant imaging and discussed with the patient.   WRAP UP:  All questions were answered. The patient knows to call the clinic with any problems, questions or concerns.  Medical decision making: Low  Time  spent on visit: I spent 15 minutes counseling the patient face to face. The total time spent in the appointment was 22 minutes and more than 50% was on counseling.  Harriett Rush, PA-C  12/23/22 8:39 AM

## 2022-12-23 ENCOUNTER — Encounter: Payer: Self-pay | Admitting: Physician Assistant

## 2022-12-23 ENCOUNTER — Other Ambulatory Visit: Payer: Self-pay

## 2022-12-23 ENCOUNTER — Inpatient Hospital Stay: Payer: Medicaid Other | Attending: Hematology | Admitting: Physician Assistant

## 2022-12-23 VITALS — BP 159/87 | HR 96 | Temp 99.0°F | Resp 18 | Wt 370.9 lb

## 2022-12-23 DIAGNOSIS — Z79899 Other long term (current) drug therapy: Secondary | ICD-10-CM | POA: Insufficient documentation

## 2022-12-23 DIAGNOSIS — D72829 Elevated white blood cell count, unspecified: Secondary | ICD-10-CM | POA: Diagnosis not present

## 2022-12-23 DIAGNOSIS — F17210 Nicotine dependence, cigarettes, uncomplicated: Secondary | ICD-10-CM | POA: Diagnosis not present

## 2022-12-23 NOTE — Patient Instructions (Signed)
[name] at [place] **   You were seen today by Tarri Abernethy PA-C for your elevated white blood cells.   Your labs did not show any evidence of genetic mutation or cancerous cause of high white blood cells. Your white blood cells are most likely elevated due to tobacco use, vaping, steroid inhaler (Symbicort), and obesity. Continue to work on weight loss and smoking cessation.  FOLLOW-UP APPOINTMENT: - Labs only in 6 months. - Labs and follow-up visit in 1 year.  ** Thank you for trusting me with your healthcare!  I strive to provide all of my patients with quality care at each visit.  If you receive a survey for this visit, I would be so grateful to you for taking the time to provide feedback.  Thank you in advance!  ~ [name]                   Dr. Derek Jack   &   Tarri Abernethy, PA-C   - - - - - - - - - - - - - - - - - -    Thank you for choosing [facility] at [facility] to provide your oncology and hematology care.  To afford each patient quality time with our provider, please arrive at least 15 minutes before your scheduled appointment time.   If you have a lab appointment with the [facility] please come in thru the Main Entrance and check in at the main information desk.  You need to re-schedule your appointment should you arrive 10 or more minutes late.  We strive to give you quality time with our providers, and arriving late affects you and other patients whose appointments are after yours.  Also, if you no show three or more times for appointments you may be dismissed from the clinic at the providers discretion.     Again, thank you for choosing [facility].  Our hope is that these requests will decrease the amount of time that you wait before being seen by our physicians.       _____________________________________________________________  Should you  have questions after your visit to [facility], please contact our office at [phone] and follow the prompts.  Our office hours are 8:00 a.m. and 4:30 p.m. Monday - Friday.  Please note that voicemails left after 4:00 p.m. may not be returned until the following business day.  We are closed weekends and major holidays.  You do have access to a nurse 24-7, just call the main number to the clinic [phone] and do not press any options, hold on the line and a nurse will answer the phone.    For prescription refill requests, have your pharmacy contact our office and allow 72 hours.

## 2022-12-24 ENCOUNTER — Other Ambulatory Visit: Payer: Self-pay | Admitting: Family Medicine

## 2022-12-24 ENCOUNTER — Other Ambulatory Visit: Payer: Self-pay | Admitting: Physical Medicine and Rehabilitation

## 2022-12-24 ENCOUNTER — Other Ambulatory Visit: Payer: Self-pay | Admitting: Internal Medicine

## 2022-12-24 DIAGNOSIS — E1165 Type 2 diabetes mellitus with hyperglycemia: Secondary | ICD-10-CM

## 2022-12-24 LAB — TOXASSURE SELECT,+ANTIDEPR,UR

## 2022-12-31 ENCOUNTER — Other Ambulatory Visit: Payer: Self-pay | Admitting: Cardiology

## 2023-01-05 ENCOUNTER — Telehealth: Payer: Self-pay | Admitting: *Deleted

## 2023-01-05 NOTE — Telephone Encounter (Signed)
Urine drug screen for this encounter is consistent for prescribed medication 

## 2023-01-13 ENCOUNTER — Other Ambulatory Visit: Payer: Self-pay | Admitting: Cardiology

## 2023-01-13 ENCOUNTER — Encounter: Payer: Self-pay | Admitting: Cardiology

## 2023-01-13 ENCOUNTER — Ambulatory Visit: Payer: Medicaid Other | Attending: Cardiology | Admitting: Cardiology

## 2023-01-13 ENCOUNTER — Other Ambulatory Visit (HOSPITAL_COMMUNITY)
Admission: RE | Admit: 2023-01-13 | Discharge: 2023-01-13 | Disposition: A | Discharge status: Home / Self Care | Payer: Medicaid Other | Source: Ambulatory Visit | Attending: Cardiology | Admitting: Cardiology

## 2023-01-13 VITALS — BP 118/70 | HR 94 | Ht 70.0 in | Wt 373.4 lb

## 2023-01-13 DIAGNOSIS — Z01818 Encounter for other preprocedural examination: Secondary | ICD-10-CM | POA: Diagnosis not present

## 2023-01-13 DIAGNOSIS — I10 Essential (primary) hypertension: Secondary | ICD-10-CM | POA: Diagnosis not present

## 2023-01-13 DIAGNOSIS — I25119 Atherosclerotic heart disease of native coronary artery with unspecified angina pectoris: Secondary | ICD-10-CM | POA: Diagnosis not present

## 2023-01-13 DIAGNOSIS — E782 Mixed hyperlipidemia: Secondary | ICD-10-CM | POA: Diagnosis not present

## 2023-01-13 DIAGNOSIS — R079 Chest pain, unspecified: Secondary | ICD-10-CM

## 2023-01-13 DIAGNOSIS — I5032 Chronic diastolic (congestive) heart failure: Secondary | ICD-10-CM | POA: Diagnosis not present

## 2023-01-13 LAB — CBC
HCT: 40.2 % (ref 36.0–46.0)
Hemoglobin: 13.8 g/dL (ref 12.0–15.0)
MCH: 30.9 pg (ref 26.0–34.0)
MCHC: 34.3 g/dL (ref 30.0–36.0)
MCV: 90.1 fL (ref 80.0–100.0)
Platelets: 311 10*3/uL (ref 150–400)
RBC: 4.46 MIL/uL (ref 3.87–5.11)
RDW: 12.5 % (ref 11.5–15.5)
WBC: 11.8 10*3/uL — ABNORMAL HIGH (ref 4.0–10.5)
nRBC: 0 % (ref 0.0–0.2)

## 2023-01-13 LAB — BASIC METABOLIC PANEL
Anion gap: 9 (ref 5–15)
BUN: 9 mg/dL (ref 6–20)
CO2: 22 mmol/L (ref 22–32)
Calcium: 8.8 mg/dL — ABNORMAL LOW (ref 8.9–10.3)
Chloride: 101 mmol/L (ref 98–111)
Creatinine, Ser: 0.75 mg/dL (ref 0.44–1.00)
GFR, Estimated: >60 mL/min (ref >60)
Glucose, Bld: 125 mg/dL — ABNORMAL HIGH (ref 70–99)
Potassium: 3.8 mmol/L (ref 3.5–5.1)
Sodium: 132 mmol/L — ABNORMAL LOW (ref 135–145)

## 2023-01-13 MED ORDER — SODIUM CHLORIDE 0.9% FLUSH: 3.0000 mL | Freq: Two times a day (BID) | INTRAVENOUS | Status: DC

## 2023-01-13 NOTE — Progress Notes (Signed)
Clinical Summary [name] is a 42 y.o.female seen today for follow up of the following medical problems.      1. CAD - prior anterior MI 09/2011, 95% lesion in mid LAD and ramus with 95% ostial stenosis. LVEF 55% by LV gram, apical hypokinesis. BMS to mid LAD and cutting balloon angioplasty of ramus.   - repeat cath 02/2012 with patent vessels and stent - 09/2011 Echo: LVEF 50% - 12/2013 Lexiscan large anteroseptal ischemia - cath 12/2013 with patent vessels  - cath 02/2016 [facility] in setting of NSTEMI showed LM patent, LAD patent with patent stent, distal LAD 30-40%, LCX patent, RCA 30-40%. LVEDP 40, PCWP 30 mean PA 32, CI 4.93 - echo 02/2016 [place] LVEF 50%, apical akinesis       - 07/2018 cath mild to moderate disease other than small D1 99% too small for PCI. Normal LVEDP - we tried imdur  daily. Reported some episodes of shaking on this medication. Changed to ranexa     12/2019 admitted with NSTEMI - cath with LM patent, ostial LAD 40%, D1 99%, ramus ostial 90%, LCX patent, RCA prox 50%. Received DES to ramus       -last visit she reported some chest pains at times. Pressure left sided, pain down right arm. Resolves with SL NG. 1-2 times per week. Some increase in frequency over the last few months. - last visit we started norvasc  daily.    - since last visit chest pains have progressed. Last week was driving, pressure left chest "dump truck on chest", 10/10 in severity +SOB, sweaty. Took NG x 2, resolved. Most severe pain she has had. In general episodes few times, typically 4-5/10.  - compliant with meds       2. Chronic diastolic HF - overall controlled, takes torsemide about 3 times a week.    3. HTN -compliant withmeds   4. Hyperlipidemia   12/2019 TC 107 TG 105 HDL 27 LDL 59 Jan 2022 TC 161 TG 096 HDL 35 LDL 94 - followed by lipid clinic, she is due for f/u    11/2021 TC 204 TG 380 HDL 27 LDL 111. Had missed some repatha doses around that time.  -  she is on crestor 40, repatha, zetia  daily - followed by lipid clinic   - was off repatha and vascepa for a few months due to insurance issues, now back. Restarted November.  Jan 2024 TC 113 TG 123 HDL 37 LDL 54   5. Left leg swelling - 02/2022 venous US was negative.  -has resolved Past Medical History:  Diagnosis Date   Allergy    Anemia    Anginal pain (HCC)    Anxiety    Arthritis    CHF (congestive heart failure) (HCC)    Chronic asthmatic bronchitis 08/17/2021   Chronic kidney disease    history of kidney stones   Coronary artery disease 09/2011   a.s/p BMS to LAD and angioplasty of RI in 09/2011 b. patent stent by cath in 02/2012, 12/2013, and 02/2016 with most recent showing 30-40% dLAD and 30-40% RCA stenosis   Depression    Diabetes mellitus    Dyspnea    GERD (gastroesophageal reflux disease)    History of kidney stones    Hyperlipidemia    Hypertension    Midsternal chest pain 03/03/2012   Migraines    Morbid obesity (HCC)    Myocardial infarct (HCC) 09/2011   Myocardial infarction (HCC) 02/2016  Neuromuscular disorder (HCC)    Palpitations 02/17/2012   Ureteral stone with hydronephrosis    Urinary tract obstruction due to kidney stone 02/15/2017     Allergies  Allergen Reactions   Bee Venom Anaphylaxis and Swelling   Latex Itching   Misc. Sulfonamide Containing Compounds Nausea And Vomiting   Sulfa Drugs Cross Reactors Nausea And Vomiting     Current Outpatient Medications  Medication Sig Dispense Refill   Accu-Chek Softclix Lancets lancets CHECK BOOD SUGAR FOUR TIMES DAILY Dx E11.9 400 each 3   albuterol (VENTOLIN HFA) 108 (90 Base) MCG/ACT inhaler Inhale 2 puffs into the lungs every 4 (four) hours as needed for wheezing or shortness of breath. 8 g 2   amLODipine (NORVASC) 5 MG tablet Take 1 tablet (5 mg total) by mouth daily. 30 tablet 6   aspirin 81 MG chewable tablet Chew 81 mg by mouth daily.     bisoprolol (ZEBETA) 5 MG tablet One twice  daily 60 tablet 3   blood glucose meter kit and supplies Dispense based on patient and insurance preference. Use up to four times daily as directed. (FOR ICD-10:  E11.22). Check BG daily 1 each 0   Blood Glucose Monitoring Suppl (ACCU-CHEK GUIDE) w/Device KIT Test BS 4 times daily Dx E11.9 1 kit 0   budesonide-formoterol (SYMBICORT) 160-4.5 MCG/ACT inhaler INHALE TWO PUFFS first thing IN THE MORNING AND THEN INHALE another 2 PUFFS about 12 HOURS LATER 10.2 g 11   busPIRone (BUSPAR) 10 MG tablet TAKE 1 TABLET BY MOUTH TWICE DAILY 60 tablet 2   clopidogrel (PLAVIX) 75 MG tablet TAKE 1 TABLET BY MOUTH EVERY DAY 90 tablet 0   DULoxetine (CYMBALTA) 60 MG capsule TAKE ONE CAPSULE BY MOUTH AT BEDTIME 30 capsule 5   ezetimibe (ZETIA) 10 MG tablet TAKE 1 TABLET BY MOUTH EVERY DAY 90 tablet 2   fenofibrate (TRICOR) 48 MG tablet TAKE 1 TABLET BY MOUTH DAILY 30 tablet 2   FLUoxetine (PROZAC) 20 MG capsule TAKE ONE CAPSULE BY MOUTH EVERY DAY. GIVE WITH CYMBALTA DUE TO NERVE PAIN AND ANXIETY/DEPRESSION 90 capsule 1   glucose blood (ACCU-CHEK GUIDE) test strip Test BS 4 times daily Dx E11.9 400 each 3   losartan (COZAAR) 25 MG tablet TAKE 1 TABLET BY MOUTH DAILY 90 tablet 2   medroxyPROGESTERone (PROVERA) 5 MG tablet TAKE 1 TABLET BY MOUTH DAILY 90 tablet 0   metFORMIN (GLUCOPHAGE) 1000 MG tablet TAKE 1 TABLET BY MOUTH TWICE DAILY 180 tablet 0   nitroGLYCERIN (NITROSTAT) 0.4 MG SL tablet DISSOLVE 1 TABLET UNDER THE TONGUE EVERY 5 MINUTES AS NEEDED FOR CHEST PAIN. DO NOT EXCEED A TOTAL OF 3 DOSES IN 15 MINUTES. 25 tablet 3   pantoprazole (PROTONIX) 40 MG tablet TAKE 1 TABLET BY MOUTH DAILY 30 tablet 1   potassium chloride SA (KLOR-CON M) 20 MEQ tablet TAKE 1 TABLET BY MOUTH EVERY DAY AS NEEDED (when taking torsemide) 90 tablet 3   pregabalin (LYRICA) 150 MG capsule Take 1 capsule (150 mg total) by mouth 2 (two) times daily. 60 capsule 5   ranolazine (RANEXA) 1000 MG SR tablet TAKE 1 TABLET BY MOUTH TWICE DAILY  180 tablet 0   REPATHA SURECLICK 140 MG/ML SOAJ INJECT 140 MG INTO THE SKIN EVERY 14 DAYS 2 mL 6   rosuvastatin (CRESTOR) 40 MG tablet TAKE 1 TABLET BY MOUTH DAILY 30 tablet 2   Semaglutide, 1 MG/DOSE, 4 MG/3ML SOPN Inject 1 mg as directed once a week. To REPLACE 0.5mg   Ozempic 9 mL 3   torsemide (DEMADEX) 20 MG tablet TAKE 2 TABLETS BY MOUTH DAILY AS NEEDED FOR swelling 180 tablet 3   traMADol (ULTRAM) 50 MG tablet Take 2 tablets (100 mg total) by mouth 2 (two) times daily. 120 tablet 5   VASCEPA 1 g capsule TAKE TWO CAPSULES BY MOUTH TWICE DAILY 360 capsule 0   Current Facility-Administered Medications  Medication Dose Route Frequency Provider Last Rate Last Admin   lidocaine (XYLOCAINE) 1 % (with pres) injection 6 mL  6 mL Other Once Genice Rouge, MD         Past Surgical History:  Procedure Laterality Date   BIOPSY  10/27/2020   Procedure: BIOPSY;  Surgeon: Corbin Ade, MD;  [facility]: [facility];  Service: Endoscopy;;   CARDIAC CATHETERIZATION  10/08/2011   LAD: 95% mid, Ramus: 95% ostial   CARPAL TUNNEL RELEASE     CESAREAN SECTION     CHOLECYSTECTOMY     CORONARY ANGIOPLASTY WITH STENT PLACEMENT  10/08/2011   LAD: BMS, Ramus: cutting balloon angioplasty   CORONARY STENT INTERVENTION N/A 01/14/2020   Procedure: CORONARY STENT INTERVENTION;  Surgeon: Marykay Lex, MD;  Location: [facility] INVASIVE CV LAB;  Service: Cardiovascular;  Laterality: N/A;   CYSTOSCOPY WITH RETROGRADE PYELOGRAM, URETEROSCOPY AND STENT PLACEMENT Left 02/16/2017   Procedure: CYSTOSCOPY WITH RETROGRADE PYELOGRAM, URETEROSCOPY AND STENT PLACEMENT;  Surgeon: Ihor Gully, MD;  Location: [facility] ORS;  Service: Urology;  Laterality: Left;   CYSTOSCOPY WITH RETROGRADE PYELOGRAM, URETEROSCOPY AND STENT PLACEMENT Left 03/28/2017   Procedure: LEFT STENT REMOVAL LEFT RETROGRADE PYELOGRAM LEFT URETEROSCOPY   LASER LITHOTRIPSY  AND  STENT REPLACEMENT;  Surgeon: Malen Gauze, MD;  Location: [facility];  Service:  Urology;  Laterality: Left;   CYSTOSCOPY WITH RETROGRADE PYELOGRAM, URETEROSCOPY AND STENT PLACEMENT Left 04/13/2022   Procedure: CYSTOSCOPY WITH RETROGRADE PYELOGRAM, URETEROSCOPY AND STENT PLACEMENT, BASKET EXTRACTION;  Surgeon: Malen Gauze, MD;  Location: [facility];  Service: Urology;  Laterality: Left;   ESOPHAGOGASTRODUODENOSCOPY (EGD) WITH PROPOFOL N/A 10/27/2020   Rourk: mild erosive reflux esophagitis, h.pylori gastritis   LEFT HEART CATH AND CORONARY ANGIOGRAPHY N/A 08/15/2018   Procedure: LEFT HEART CATH AND CORONARY ANGIOGRAPHY;  Surgeon: Swaziland, Peter M, MD;  Location: [facility] CV LAB;  Service: Cardiovascular;  Laterality: N/A;   LEFT HEART CATH AND CORONARY ANGIOGRAPHY N/A 01/14/2020   Procedure: LEFT HEART CATH AND CORONARY ANGIOGRAPHY;  Surgeon: Marykay Lex, MD;  Location: [facility] INVASIVE CV LAB;  Service: Cardiovascular;  Laterality: N/A;   LEFT HEART CATHETERIZATION WITH CORONARY ANGIOGRAM N/A 10/08/2011   Procedure: LEFT HEART CATHETERIZATION WITH CORONARY ANGIOGRAM;  Surgeon: Peter M Swaziland, MD;  Location: [facility] CATH LAB;  Service: Cardiovascular;  Laterality: N/A;   LEFT HEART CATHETERIZATION WITH CORONARY ANGIOGRAM N/A 03/02/2012   Procedure: LEFT HEART CATHETERIZATION WITH CORONARY ANGIOGRAM;  Surgeon: Kathleene Hazel, MD;  Location: [facility] CATH LAB;  Service: Cardiovascular;  Laterality: N/A;   LEFT HEART CATHETERIZATION WITH CORONARY ANGIOGRAM N/A 01/04/2014   Procedure: LEFT HEART CATHETERIZATION WITH CORONARY ANGIOGRAM;  Surgeon: Lennette Bihari, MD;  Location: [facility] CATH LAB;  Service: Cardiovascular;  Laterality: N/A;   right hand pinky surgery       Allergies  Allergen Reactions   Bee Venom Anaphylaxis and Swelling   Latex Itching   Misc. Sulfonamide Containing Compounds Nausea And Vomiting   Sulfa Drugs Cross Reactors Nausea And Vomiting      Family History  Problem Relation Age of Onset  Heart murmur Father    Diabetes Father    Hypertension  Father    Hyperlipidemia Father    Cancer Father        throat   Diabetes Mother    Cancer Mother        breast.uterine, ovarian   Asthma Son    Appendicitis Son    Hernia Son    Mental illness Son        Bipolar, personality d/o   Stroke Maternal Grandmother    Cancer Maternal Grandmother        breast   Cancer Paternal Grandfather        lung   Colon cancer Neg Hx      Social History Ms. Lonsway reports that she has been smoking cigarettes. She started smoking about 32 years ago. She has a 30.00 pack-year smoking history. She has never used smokeless tobacco. Ms. Purewal reports that she does not currently use alcohol.   Review of Systems CONSTITUTIONAL: No weight loss, fever, chills, weakness or fatigue.  HEENT: Eyes: No visual loss, blurred vision, double vision or yellow sclerae.No hearing loss, sneezing, congestion, runny nose or sore throat.  SKIN: No rash or itching.  CARDIOVASCULAR: per hpi RESPIRATORY: No shortness of breath, cough or sputum.  GASTROINTESTINAL: No anorexia, nausea, vomiting or diarrhea. No abdominal pain or blood.  GENITOURINARY: No burning on urination, no polyuria NEUROLOGICAL: No headache, dizziness, syncope, paralysis, ataxia, numbness or tingling in the extremities. No change in bowel or bladder control.  MUSCULOSKELETAL: No muscle, back pain, joint pain or stiffness.  LYMPHATICS: No enlarged nodes. No history of splenectomy.  PSYCHIATRIC: No history of depression or anxiety.  ENDOCRINOLOGIC: No reports of sweating, cold or heat intolerance. No polyuria or polydipsia.  Marland Kitchen   Physical Examination Today's Vitals   01/13/23 0823  BP: 118/70  Pulse: 94  SpO2: 96%  Weight: (!) 373 lb 6.4 oz (169.4 kg)  Height: 5\' 10"  (1.778 m)   Body mass index is 53.58 kg/m.  Gen: resting comfortably, no acute distress HEENT: no scleral icterus, pupils equal round and reactive, no palptable cervical adenopathy,  CV: RRR, no m/rg, no jvd Resp: Clear to  auscultation bilaterally GI: abdomen is soft, non-tender, non-distended, normal bowel sounds, no hepatosplenomegaly MSK: extremities are warm, no edema.  Skin: warm, no rash Neuro:  no focal deficits Psych: appropriate affect   Diagnostic Studies  02/2012 Cath Hemodynamic Findings:   Central aortic pressure: 108/70   Left ventricular pressure: 123/10/21   Angiographic Findings:   Left main: No obstructive disease noted.   Left Anterior Descending Artery: Large caliber vessel that courses to the apex. There is a stent present in the mid vessel that is widely patent with no restenosis. The remainder of the LAD is disease free. There is a moderate sized diagonal Zamiah Tollett with 30% proximal stenosis.   Circumflex Artery: Dominant, large caliber vessel with no disease throughout the AV groove segment. There is small to moderate sized intermediate Shinichi Anguiano that has ostial 30% stenosis. The remainder of the Circumflex has no obstructive disease.   Right Coronary Artery: Small, non-dominant vessel. No disease noted.   Left Ventricular Angiogram: LVEF 50-55%.   Impression:   1. Double vessel CAD with patent stent mid LAD and patent angioplasty site at ostium of intermediate Amond Speranza.   2. Normal LV systolic function     09/2011 Cath PROCEDURAL FINDINGS   Hemodynamics:   AO 135/92 with a mean of 114 mmHg   LV 136/25  mmHg   Coronary angiography:   Coronary dominance: Left   Left mainstem: Normal.   Left anterior descending (LAD): There is a 95% stenosis in the mid LAD immediately after the takeoff of the first diagonal. The first diagonal Aslynn Brunetti is moderate in size and appears normal. The LAD stenosis does appear to be hazy.   There is a moderately large ramus intermediate Irelynn Schermerhorn which has a 95% ostial stenosis.   Left circumflex (LCx): This is a dominant vessel and appears normal throughout.   Right coronary artery (RCA): This is a small nondominant vessel and is normal.   Left ventriculography:  Left ventricular systolic function is abnormal with apical hypokinesis. LVEF is estimated at 55%, there is no significant mitral regurgitation   PCI Note: Following the diagnostic procedure, the decision was made to proceed with PCI of the LAD. Weight-based bivalirudin was given for anticoagulation. Once a therapeutic ACT was achieved, a 5 [place] EBU guide catheter was inserted. A pro-water coronary guidewire was used to cross the lesion. The lesion was predilated with a 2.5 mm balloon. The lesion was then stented with a 3.0 x 18 mm vision stent. The stent was postdilated with a 3.25 mm noncompliant balloon. Following PCI, there was 0% residual stenosis and TIMI-3 flow. At this point the patient's pain was improved but was Diamant of moderate intensity. We then proceeded to intervene on the ostial ramus intermediate lesion. This was crossed with the prowater wire. This was dilated using a 3.0 x 10 mm cutting balloon performing 3 inflations to 6 atmospheres. This showed a good angiographic result with less than 30% residual stenosis and TIMI grade 3 flow. Final angiography confirmed an excellent result. The patient tolerated the procedure well. There were no immediate procedural complications. A TR band was used for radial hemostasis. The patient was transferred to the post catheterization recovery area for further monitoring.   PCI Data:   Vessel - LAD/Segment - mid vessel immediately after the takeoff of the first diagonal.   Percent Stenosis (pre) 95%   TIMI-flow 3.   Stent 3.0 x 18 mm vision stent.   Percent Stenosis (post) 0%   TIMI-flow (post) 3.   Second vessel: Ostial ramus intermediate Lemonte Al   Percent stenosis: 95%.   TIMI flow 3.   3.0 mm cutting balloon   Percent stenosis (post) less than 30%   TIMI flow 3   Final Conclusions:   1. Severe 2 vessel obstructive coronary disease.   2. Overall well preserved LV systolic function with apical wall motion abnormality and ejection fraction of 55%.    3. Successful intracoronary stenting of the mid LAD with a bare-metal stent.   4. Successful cutting balloon angioplasty of the ramus intermediate Brieonna Crutcher.   Recommendations:   Continue therapy with Plavix. Patient reports a history of allergy to aspirin. Aggressive risk factor modification.   Disposition: Patient be observed in the intensive care unit tonight. If she has no complications she should be able to be transferred to telemetry in the morning.     12/2013 Cath HEMODYNAMICS:    Central Aorta: 100/60    Left Ventricle: 100/3   ANGIOGRAPHY:   1. Left main: Normal and trifurcated into an LAD, ramus intermediate vessel, and a dominant left circumflex coronary artery   2. LAD: Barbera Setters gave rise to a proximal large first diagonal vessel.  The LAD just after the diagonal vessel had a widely patent stent.  The remainder of the LAD was free of significant disease  and extended to the LV apex. 3. Ramus Intermediate: No evidence for restenosis at previous site of ostial cutting balloon intervention. 4. Left circumflex: Angiographically normal.  Dominant vessel, which gave rise to 2 marginal branches and in the PDA.Marland Kitchen   4. Right coronary artery: Angiographically normal, nondominant vessel   Left ventriculography revealed global LV function.  There was a suggestion of mild residual distal anterolateral hypocontractility.  There was catheter-induced mitral regurgitation.   IMPRESSION:   Normal LV function with mild distal anterolateral, residual hypocontractility   No significant residual coronary obstructive disease with evidence for widely patent LAD stent, no evidence for restenosis of the ramus intermediate vessel, normal dominant left circumflex coronary artery and normal nondominant right coronary artery.       12/2013 MPI IMPRESSION: 1.  Abnormal Lexiscan for ischemia   2.  Large anteroseptal and apical area of ischemia   3. High risk study for major cardiac events based on area  of myocardium at [facility]   4. Normal left ventricular systolic function, left ventricular ejection fraction 52%     12/2019 cath A drug-eluting stent was successfully placed using a STENT RESOLUTE ONYX 2.5X30. Postdilated to 2.7 mm Post intervention, there is a 0% residual stenosis throughout the entire segment.Marland Kitchen ------------ Suezanne Jacquet LAD lesion is 40% stenosed. Prox LAD previously placed BMS stent is 30% stenosed. Prox LAD to Mid LAD lesion is 20% stenosed. Ost 1st Diag lesion is 99% stenosed. Known from prior catheterizations Small, nondominant RCA: Prox RCA to Dist RCA lesion is 50% stenosed. -------------- The left ventricular systolic function is normal. The left ventricular ejection fraction is 55-65% by visual estimate. LV end diastolic pressure is normal.   CULPRIT LESION: Ostial and proximal RAMUS INTERMEDIUS (RI) 95-70%  Successful DES PCI of RI (RESOLUTE ONYX DES 2.5 mm x 30 mm--2.7 mm) Otherwise moderate diffuse disease: Diffuse moderate LAD 20-30% stenosis and known 99% subtotal occlusion of the very small caliber 1st Diag Preserved LVEF and normal EDP.   Assessment and Plan  1. CAD with angina pectoris - due to disease burden have elected for extended plavix use - progressing anginal symptoms despite being on 3 antianginals, had prior side effects on imdur - given progressing symptoms will plan for heart catheterization - EKG today NSR, no acute ischemic changes   Shared Decision Making/Informed Consent The risks [stroke (1 in 1000), death (1 in 1000), kidney failure [usually temporary] (1 in 500), bleeding (1 in 200), allergic reaction [possibly serious] (1 in 200)], benefits (diagnostic support and management of coronary artery disease) and alternatives of a cardiac catheterization were discussed in detail with Ms. Decelle and she is willing to proceed.    2. Chronic diastolic HF - euvolemic today, continue prn torsemide     3. HTN -at goal, continue current meds   4.  Hyperlipidemia - at goal, continue current meds  F/u 6 weeks    Antoine Poche, M.D.

## 2023-01-13 NOTE — Addendum Note (Signed)
Addended by: Roseanne Reno on: 01/13/2023 09:35 AM   Modules accepted: Orders

## 2023-01-13 NOTE — Patient Instructions (Signed)
Medication Instructions:  Your physician recommends that you continue on your current medications as directed. Please refer to the Current Medication list given to you today.  *If you need a refill on your cardiac medications before your next appointment, please call your pharmacy*   Lab Work: CBC BMET  If you have labs (blood work) drawn today and your tests are completely normal, you will receive your results only by: MyChart Message (if you have MyChart) OR A paper copy in the mail If you have any lab test that is abnormal or we need to change your treatment, we will call you to review the results.   Testing/Procedures: None   Follow-Up: At [facility], you and your health needs are our priority.  As part of our continuing mission to provide you with exceptional heart care, we have created designated Provider Care Teams.  These Care Teams include your primary Cardiologist (physician) and Advanced Practice Providers (APPs -  Physician Assistants and Nurse Practitioners) who all work together to provide you with the care you need, when you need it.  We recommend signing up for the patient portal called "MyChart".  Sign up information is provided on this After Visit Summary.  MyChart is used to connect with patients for Virtual Visits (Telemedicine).  Patients are able to view lab/test results, encounter notes, upcoming appointments, etc.  Non-urgent messages can be sent to your provider as well.   To learn more about what you can do with MyChart, go to [email or web address].    Your next appointment:   6 week(s)  Provider:   Dina Rich, MD/APP   Other Instructions   [facility] A DEPT OF [facility] [place] [facility] [address] [id number] [facility] [place] [address] Dept: [phone] Loc: [phone]  Annalia Lezon Glennon  01/13/2023  You are scheduled for a Cardiac Catheterization on Tuesday, April 23 with  Dr. Bryan Lemma.  1. Please arrive at the [facility] (Main Entrance A) at [facility]: [address] [place], [place] [address] at 7:00 AM (This time is two hours before your procedure to ensure your preparation). Free valet parking service is available. You will check in at ADMITTING. The support person will be asked to wait in the waiting room.  It is OK to have someone drop you off and come back when you are ready to be discharged.    Special note: Every effort is made to have your procedure done on time. Please understand that emergencies sometimes delay scheduled procedures.  2. Diet: Do not eat solid foods after midnight.  The patient may have clear liquids until 5am upon the day of the procedure.  3. Labs: You will need to have blood drawn on Today at [facility]. You do not need to be fasting.  4. Medication instructions in preparation for your procedure:  Hold Losartan the morning of your procedure.  Hold Torsemide the morning of your procedure.   Do not take Diabetes Med Glucophage (Metformin) on the day of the procedure and HOLD 48 HOURS AFTER THE PROCEDURE.  On the morning of your procedure, take your Aspirin 81 mg and any morning medicines NOT listed above.  You may use sips of water.  5. Plan to go home the same day, you will only stay overnight if medically necessary. 6. Bring a current list of your medications and current insurance cards. 7. You MUST have a responsible person to drive you home.  8. Someone MUST be with you the first 24 hours after you arrive home or your discharge will be delayed. 9. Please wear clothes that are easy to get on and off and wear slip-on shoes.  Thank you for allowing [place] to care for you!   -- [place] [facility]

## 2023-01-13 NOTE — H&P (View-Only) (Signed)
Clinical Summary [name] is a 42 y.o.female seen today for follow up of the following medical problems.      1. CAD - prior anterior MI 09/2011, 95% lesion in mid LAD and ramus with 95% ostial stenosis. LVEF 55% by LV gram, apical hypokinesis. BMS to mid LAD and cutting balloon angioplasty of ramus.   - repeat cath 02/2012 with patent vessels and stent - 09/2011 Echo: LVEF 50% - 12/2013 Lexiscan large anteroseptal ischemia - cath 12/2013 with patent vessels  - cath 02/2016 [facility] in setting of NSTEMI showed LM patent, LAD patent with patent stent, distal LAD 30-40%, LCX patent, RCA 30-40%. LVEDP 40, PCWP 30 mean PA 32, CI 4.93 - echo 02/2016 [place] LVEF 50%, apical akinesis       - 07/2018 cath mild to moderate disease other than small D1 99% too small for PCI. Normal LVEDP - we tried imdur  daily. Reported some episodes of shaking on this medication. Changed to ranexa     12/2019 admitted with NSTEMI - cath with LM patent, ostial LAD 40%, D1 99%, ramus ostial 90%, LCX patent, RCA prox 50%. Received DES to ramus       -last visit she reported some chest pains at times. Pressure left sided, pain down right arm. Resolves with SL NG. 1-2 times per week. Some increase in frequency over the last few months. - last visit we started norvasc  daily.    - since last visit chest pains have progressed. Last week was driving, pressure left chest "dump truck on chest", 10/10 in severity +SOB, sweaty. Took NG x 2, resolved. Most severe pain she has had. In general episodes few times, typically 4-5/10.  - compliant with meds       2. Chronic diastolic HF - overall controlled, takes torsemide about 3 times a week.    3. HTN -compliant withmeds   4. Hyperlipidemia   12/2019 TC 107 TG 105 HDL 27 LDL 59 Jan 2022 TC 161 TG 096 HDL 35 LDL 94 - followed by lipid clinic, she is due for f/u    11/2021 TC 204 TG 380 HDL 27 LDL 111. Had missed some repatha doses around that time.  -  she is on crestor 40, repatha, zetia  daily - followed by lipid clinic   - was off repatha and vascepa for a few months due to insurance issues, now back. Restarted November.  Jan 2024 TC 113 TG 123 HDL 37 LDL 54   5. Left leg swelling - 02/2022 venous US was negative.  -has resolved Past Medical History:  Diagnosis Date   Allergy    Anemia    Anginal pain (HCC)    Anxiety    Arthritis    CHF (congestive heart failure) (HCC)    Chronic asthmatic bronchitis 08/17/2021   Chronic kidney disease    history of kidney stones   Coronary artery disease 09/2011   a.s/p BMS to LAD and angioplasty of RI in 09/2011 b. patent stent by cath in 02/2012, 12/2013, and 02/2016 with most recent showing 30-40% dLAD and 30-40% RCA stenosis   Depression    Diabetes mellitus    Dyspnea    GERD (gastroesophageal reflux disease)    History of kidney stones    Hyperlipidemia    Hypertension    Midsternal chest pain 03/03/2012   Migraines    Morbid obesity (HCC)    Myocardial infarct (HCC) 09/2011   Myocardial infarction (HCC) 02/2016  Neuromuscular disorder (HCC)    Palpitations 02/17/2012   Ureteral stone with hydronephrosis    Urinary tract obstruction due to kidney stone 02/15/2017     Allergies  Allergen Reactions   Bee Venom Anaphylaxis and Swelling   Latex Itching   Misc. Sulfonamide Containing Compounds Nausea And Vomiting   Sulfa Drugs Cross Reactors Nausea And Vomiting     Current Outpatient Medications  Medication Sig Dispense Refill   Accu-Chek Softclix Lancets lancets CHECK BOOD SUGAR FOUR TIMES DAILY Dx E11.9 400 each 3   albuterol (VENTOLIN HFA) 108 (90 Base) MCG/ACT inhaler Inhale 2 puffs into the lungs every 4 (four) hours as needed for wheezing or shortness of breath. 8 g 2   amLODipine (NORVASC) 5 MG tablet Take 1 tablet (5 mg total) by mouth daily. 30 tablet 6   aspirin 81 MG chewable tablet Chew 81 mg by mouth daily.     bisoprolol (ZEBETA) 5 MG tablet One twice  daily 60 tablet 3   blood glucose meter kit and supplies Dispense based on patient and insurance preference. Use up to four times daily as directed. (FOR ICD-10:  E11.22). Check BG daily 1 each 0   Blood Glucose Monitoring Suppl (ACCU-CHEK GUIDE) w/Device KIT Test BS 4 times daily Dx E11.9 1 kit 0   budesonide-formoterol (SYMBICORT) 160-4.5 MCG/ACT inhaler INHALE TWO PUFFS first thing IN THE MORNING AND THEN INHALE another 2 PUFFS about 12 HOURS LATER 10.2 g 11   busPIRone (BUSPAR) 10 MG tablet TAKE 1 TABLET BY MOUTH TWICE DAILY 60 tablet 2   clopidogrel (PLAVIX) 75 MG tablet TAKE 1 TABLET BY MOUTH EVERY DAY 90 tablet 0   DULoxetine (CYMBALTA) 60 MG capsule TAKE ONE CAPSULE BY MOUTH AT BEDTIME 30 capsule 5   ezetimibe (ZETIA) 10 MG tablet TAKE 1 TABLET BY MOUTH EVERY DAY 90 tablet 2   fenofibrate (TRICOR) 48 MG tablet TAKE 1 TABLET BY MOUTH DAILY 30 tablet 2   FLUoxetine (PROZAC) 20 MG capsule TAKE ONE CAPSULE BY MOUTH EVERY DAY. GIVE WITH CYMBALTA DUE TO NERVE PAIN AND ANXIETY/DEPRESSION 90 capsule 1   glucose blood (ACCU-CHEK GUIDE) test strip Test BS 4 times daily Dx E11.9 400 each 3   losartan (COZAAR) 25 MG tablet TAKE 1 TABLET BY MOUTH DAILY 90 tablet 2   medroxyPROGESTERone (PROVERA) 5 MG tablet TAKE 1 TABLET BY MOUTH DAILY 90 tablet 0   metFORMIN (GLUCOPHAGE) 1000 MG tablet TAKE 1 TABLET BY MOUTH TWICE DAILY 180 tablet 0   nitroGLYCERIN (NITROSTAT) 0.4 MG SL tablet DISSOLVE 1 TABLET UNDER THE TONGUE EVERY 5 MINUTES AS NEEDED FOR CHEST PAIN. DO NOT EXCEED A TOTAL OF 3 DOSES IN 15 MINUTES. 25 tablet 3   pantoprazole (PROTONIX) 40 MG tablet TAKE 1 TABLET BY MOUTH DAILY 30 tablet 1   potassium chloride SA (KLOR-CON M) 20 MEQ tablet TAKE 1 TABLET BY MOUTH EVERY DAY AS NEEDED (when taking torsemide) 90 tablet 3   pregabalin (LYRICA) 150 MG capsule Take 1 capsule (150 mg total) by mouth 2 (two) times daily. 60 capsule 5   ranolazine (RANEXA) 1000 MG SR tablet TAKE 1 TABLET BY MOUTH TWICE DAILY  180 tablet 0   REPATHA SURECLICK 140 MG/ML SOAJ INJECT 140 MG INTO THE SKIN EVERY 14 DAYS 2 mL 6   rosuvastatin (CRESTOR) 40 MG tablet TAKE 1 TABLET BY MOUTH DAILY 30 tablet 2   Semaglutide, 1 MG/DOSE, 4 MG/3ML SOPN Inject 1 mg as directed once a week. To REPLACE 0.5mg   Ozempic 9 mL 3   torsemide (DEMADEX) 20 MG tablet TAKE 2 TABLETS BY MOUTH DAILY AS NEEDED FOR swelling 180 tablet 3   traMADol (ULTRAM) 50 MG tablet Take 2 tablets (100 mg total) by mouth 2 (two) times daily. 120 tablet 5   VASCEPA 1 g capsule TAKE TWO CAPSULES BY MOUTH TWICE DAILY 360 capsule 0   Current Facility-Administered Medications  Medication Dose Route Frequency Provider Last Rate Last Admin   lidocaine (XYLOCAINE) 1 % (with pres) injection 6 mL  6 mL Other Once Genice Rouge, MD         Past Surgical History:  Procedure Laterality Date   BIOPSY  10/27/2020   Procedure: BIOPSY;  Surgeon: Corbin Ade, MD;  Location: [facility];  Service: Endoscopy;;   CARDIAC CATHETERIZATION  10/08/2011   LAD: 95% mid, Ramus: 95% ostial   CARPAL TUNNEL RELEASE     CESAREAN SECTION     CHOLECYSTECTOMY     CORONARY ANGIOPLASTY WITH STENT PLACEMENT  10/08/2011   LAD: BMS, Ramus: cutting balloon angioplasty   CORONARY STENT INTERVENTION N/A 01/14/2020   Procedure: CORONARY STENT INTERVENTION;  Surgeon: Marykay Lex, MD;  Location: [facility] INVASIVE CV LAB;  Service: Cardiovascular;  Laterality: N/A;   CYSTOSCOPY WITH RETROGRADE PYELOGRAM, URETEROSCOPY AND STENT PLACEMENT Left 02/16/2017   Procedure: CYSTOSCOPY WITH RETROGRADE PYELOGRAM, URETEROSCOPY AND STENT PLACEMENT;  Surgeon: Ihor Gully, MD;  Location: [facility] ORS;  Service: Urology;  Laterality: Left;   CYSTOSCOPY WITH RETROGRADE PYELOGRAM, URETEROSCOPY AND STENT PLACEMENT Left 03/28/2017   Procedure: LEFT STENT REMOVAL LEFT RETROGRADE PYELOGRAM LEFT URETEROSCOPY   LASER LITHOTRIPSY  AND  STENT REPLACEMENT;  Surgeon: Malen Gauze, MD;  Location: [facility] OR[facility];  Service:  Urology;  Laterality: Left;   CYSTOSCOPY WITH RETROGRADE PYELOGRAM, URETEROSCOPY AND STENT PLACEMENT Left 04/13/2022   Procedure: CYSTOSCOPY WITH RETROGRADE PYELOGRAM, URETEROSCOPY AND STENT PLACEMENT, BASKET EXTRACTION;  Surgeon: Malen Gauze, MD;  Location: [facility];  Service: Urology;  Laterality: Left;   ESOPHAGOGASTRODUODENOSCOPY (EGD) WITH PROPOFOL N/A 10/27/2020   Rourk: mild erosive reflux esophagitis, h.pylori gastritis   LEFT HEART CATH AND CORONARY ANGIOGRAPHY N/A 08/15/2018   Procedure: LEFT HEART CATH AND CORONARY ANGIOGRAPHY;  Surgeon: Swaziland, Peter M, MD;  Location: [facility] INVASIVE CV LAB;  Service: Cardiovascular;  Laterality: N/A;   LEFT HEART CATH AND CORONARY ANGIOGRAPHY N/A 01/14/2020   Procedure: LEFT HEART CATH AND CORONARY ANGIOGRAPHY;  Surgeon: Marykay Lex, MD;  Location: [facility] INVASIVE CV LAB;  Service: Cardiovascular;  Laterality: N/A;   LEFT HEART CATHETERIZATION WITH CORONARY ANGIOGRAM N/A 10/08/2011   Procedure: LEFT HEART CATHETERIZATION WITH CORONARY ANGIOGRAM;  Surgeon: Peter M Swaziland, MD;  Location: [facility] CATH LAB;  Service: Cardiovascular;  Laterality: N/A;   LEFT HEART CATHETERIZATION WITH CORONARY ANGIOGRAM N/A 03/02/2012   Procedure: LEFT HEART CATHETERIZATION WITH CORONARY ANGIOGRAM;  Surgeon: Kathleene Hazel, MD;  Location: [facility] CATH LAB;  Service: Cardiovascular;  Laterality: N/A;   LEFT HEART CATHETERIZATION WITH CORONARY ANGIOGRAM N/A 01/04/2014   Procedure: LEFT HEART CATHETERIZATION WITH CORONARY ANGIOGRAM;  Surgeon: Lennette Bihari, MD;  Location: [facility] CATH LAB;  Service: Cardiovascular;  Laterality: N/A;   right hand pinky surgery       Allergies  Allergen Reactions   Bee Venom Anaphylaxis and Swelling   Latex Itching   Misc. Sulfonamide Containing Compounds Nausea And Vomiting   Sulfa Drugs Cross Reactors Nausea And Vomiting      Family History  Problem Relation Age of Onset  Heart murmur Father    Diabetes Father    Hypertension  Father    Hyperlipidemia Father    Cancer Father        throat   Diabetes Mother    Cancer Mother        breast.uterine, ovarian   Asthma Son    Appendicitis Son    Hernia Son    Mental illness Son        Bipolar, personality d/o   Stroke Maternal Grandmother    Cancer Maternal Grandmother        breast   Cancer Paternal Grandfather        lung   Colon cancer Neg Hx      Social History Ms. Caracci reports that she has been smoking cigarettes. She started smoking about 32 years ago. She has a 30.00 pack-year smoking history. She has never used smokeless tobacco. Ms. Landeck reports that she does not currently use alcohol.   Review of Systems CONSTITUTIONAL: No weight loss, fever, chills, weakness or fatigue.  HEENT: Eyes: No visual loss, blurred vision, double vision or yellow sclerae.No hearing loss, sneezing, congestion, runny nose or sore throat.  SKIN: No rash or itching.  CARDIOVASCULAR: per hpi RESPIRATORY: No shortness of breath, cough or sputum.  GASTROINTESTINAL: No anorexia, nausea, vomiting or diarrhea. No abdominal pain or blood.  GENITOURINARY: No burning on urination, no polyuria NEUROLOGICAL: No headache, dizziness, syncope, paralysis, ataxia, numbness or tingling in the extremities. No change in bowel or bladder control.  MUSCULOSKELETAL: No muscle, back pain, joint pain or stiffness.  LYMPHATICS: No enlarged nodes. No history of splenectomy.  PSYCHIATRIC: No history of depression or anxiety.  ENDOCRINOLOGIC: No reports of sweating, cold or heat intolerance. No polyuria or polydipsia.  Marland Kitchen   Physical Examination Today's Vitals   01/13/23 0823  BP: 118/70  Pulse: 94  SpO2: 96%  Weight: (!) 373 lb 6.4 oz (169.4 kg)  Height: 5\' 10"  (1.778 m)   Body mass index is 53.58 kg/m.  Gen: resting comfortably, no acute distress HEENT: no scleral icterus, pupils equal round and reactive, no palptable cervical adenopathy,  CV: RRR, no m/rg, no jvd Resp: Clear to  auscultation bilaterally GI: abdomen is soft, non-tender, non-distended, normal bowel sounds, no hepatosplenomegaly MSK: extremities are warm, no edema.  Skin: warm, no rash Neuro:  no focal deficits Psych: appropriate affect   Diagnostic Studies  02/2012 Cath Hemodynamic Findings:   Central aortic pressure: 108/70   Left ventricular pressure: 123/10/21   Angiographic Findings:   Left main: No obstructive disease noted.   Left Anterior Descending Artery: Large caliber vessel that courses to the apex. There is a stent present in the mid vessel that is widely patent with no restenosis. The remainder of the LAD is disease free. There is a moderate sized diagonal Arlando Leisinger with 30% proximal stenosis.   Circumflex Artery: Dominant, large caliber vessel with no disease throughout the AV groove segment. There is small to moderate sized intermediate Hailynn Slovacek that has ostial 30% stenosis. The remainder of the Circumflex has no obstructive disease.   Right Coronary Artery: Small, non-dominant vessel. No disease noted.   Left Ventricular Angiogram: LVEF 50-55%.   Impression:   1. Double vessel CAD with patent stent mid LAD and patent angioplasty site at ostium of intermediate Willean Schurman.   2. Normal LV systolic function     09/2011 Cath PROCEDURAL FINDINGS   Hemodynamics:   AO 135/92 with a mean of 114 mmHg   LV 136/25  mmHg   Coronary angiography:   Coronary dominance: Left   Left mainstem: Normal.   Left anterior descending (LAD): There is a 95% stenosis in the mid LAD immediately after the takeoff of the first diagonal. The first diagonal Sonny Anthes is moderate in size and appears normal. The LAD stenosis does appear to be hazy.   There is a moderately large ramus intermediate Arraya Buck which has a 95% ostial stenosis.   Left circumflex (LCx): This is a dominant vessel and appears normal throughout.   Right coronary artery (RCA): This is a small nondominant vessel and is normal.   Left ventriculography:  Left ventricular systolic function is abnormal with apical hypokinesis. LVEF is estimated at 55%, there is no significant mitral regurgitation   PCI Note: Following the diagnostic procedure, the decision was made to proceed with PCI of the LAD. Weight-based bivalirudin was given for anticoagulation. Once a therapeutic ACT was achieved, a 5 [place] EBU guide catheter was inserted. A pro-water coronary guidewire was used to cross the lesion. The lesion was predilated with a 2.5 mm balloon. The lesion was then stented with a 3.0 x 18 mm vision stent. The stent was postdilated with a 3.25 mm noncompliant balloon. Following PCI, there was 0% residual stenosis and TIMI-3 flow. At this point the patient's pain was improved but was Diamant of moderate intensity. We then proceeded to intervene on the ostial ramus intermediate lesion. This was crossed with the prowater wire. This was dilated using a 3.0 x 10 mm cutting balloon performing 3 inflations to 6 atmospheres. This showed a good angiographic result with less than 30% residual stenosis and TIMI grade 3 flow. Final angiography confirmed an excellent result. The patient tolerated the procedure well. There were no immediate procedural complications. A TR band was used for radial hemostasis. The patient was transferred to the post catheterization recovery area for further monitoring.   PCI Data:   Vessel - LAD/Segment - mid vessel immediately after the takeoff of the first diagonal.   Percent Stenosis (pre) 95%   TIMI-flow 3.   Stent 3.0 x 18 mm vision stent.   Percent Stenosis (post) 0%   TIMI-flow (post) 3.   Second vessel: Ostial ramus intermediate Remell Giaimo   Percent stenosis: 95%.   TIMI flow 3.   3.0 mm cutting balloon   Percent stenosis (post) less than 30%   TIMI flow 3   Final Conclusions:   1. Severe 2 vessel obstructive coronary disease.   2. Overall well preserved LV systolic function with apical wall motion abnormality and ejection fraction of 55%.    3. Successful intracoronary stenting of the mid LAD with a bare-metal stent.   4. Successful cutting balloon angioplasty of the ramus intermediate Aleksa Collinsworth.   Recommendations:   Continue therapy with Plavix. Patient reports a history of allergy to aspirin. Aggressive risk factor modification.   Disposition: Patient be observed in the intensive care unit tonight. If she has no complications she should be able to be transferred to telemetry in the morning.     12/2013 Cath HEMODYNAMICS:    Central Aorta: 100/60    Left Ventricle: 100/3   ANGIOGRAPHY:   1. Left main: Normal and trifurcated into an LAD, ramus intermediate vessel, and a dominant left circumflex coronary artery   2. LAD: Barbera Setters gave rise to a proximal large first diagonal vessel.  The LAD just after the diagonal vessel had a widely patent stent.  The remainder of the LAD was free of significant disease  and extended to the LV apex. 3. Ramus Intermediate: No evidence for restenosis at previous site of ostial cutting balloon intervention. 4. Left circumflex: Angiographically normal.  Dominant vessel, which gave rise to 2 marginal branches and in the PDA.Marland Kitchen   4. Right coronary artery: Angiographically normal, nondominant vessel   Left ventriculography revealed global LV function.  There was a suggestion of mild residual distal anterolateral hypocontractility.  There was catheter-induced mitral regurgitation.   IMPRESSION:   Normal LV function with mild distal anterolateral, residual hypocontractility   No significant residual coronary obstructive disease with evidence for widely patent LAD stent, no evidence for restenosis of the ramus intermediate vessel, normal dominant left circumflex coronary artery and normal nondominant right coronary artery.       12/2013 MPI IMPRESSION: 1.  Abnormal Lexiscan for ischemia   2.  Large anteroseptal and apical area of ischemia   3. High risk study for major cardiac events based on area  of myocardium at [facility]   4. Normal left ventricular systolic function, left ventricular ejection fraction 52%     12/2019 cath A drug-eluting stent was successfully placed using a STENT RESOLUTE ONYX 2.5X30. Postdilated to 2.7 mm Post intervention, there is a 0% residual stenosis throughout the entire segment.Marland Kitchen ------------ Suezanne Jacquet LAD lesion is 40% stenosed. Prox LAD previously placed BMS stent is 30% stenosed. Prox LAD to Mid LAD lesion is 20% stenosed. Ost 1st Diag lesion is 99% stenosed. Known from prior catheterizations Small, nondominant RCA: Prox RCA to Dist RCA lesion is 50% stenosed. -------------- The left ventricular systolic function is normal. The left ventricular ejection fraction is 55-65% by visual estimate. LV end diastolic pressure is normal.   CULPRIT LESION: Ostial and proximal RAMUS INTERMEDIUS (RI) 95-70%  Successful DES PCI of RI (RESOLUTE ONYX DES 2.5 mm x 30 mm--2.7 mm) Otherwise moderate diffuse disease: Diffuse moderate LAD 20-30% stenosis and known 99% subtotal occlusion of the very small caliber 1st Diag Preserved LVEF and normal EDP.   Assessment and Plan  1. CAD with angina pectoris - due to disease burden have elected for extended plavix use - progressing anginal symptoms despite being on 3 antianginals, had prior side effects on imdur - given progressing symptoms will plan for heart catheterization - EKG today NSR, no acute ischemic changes   Shared Decision Making/Informed Consent The risks [stroke (1 in 1000), death (1 in 1000), kidney failure [usually temporary] (1 in 500), bleeding (1 in 200), allergic reaction [possibly serious] (1 in 200)], benefits (diagnostic support and management of coronary artery disease) and alternatives of a cardiac catheterization were discussed in detail with Ms. Decelle and she is willing to proceed.    2. Chronic diastolic HF - euvolemic today, continue prn torsemide     3. HTN -at goal, continue current meds   4.  Hyperlipidemia - at goal, continue current meds  F/u 6 weeks    Antoine Poche, M.D.

## 2023-01-15 ENCOUNTER — Other Ambulatory Visit: Payer: Self-pay | Admitting: Cardiology

## 2023-01-17 ENCOUNTER — Telehealth: Payer: Self-pay | Admitting: *Deleted

## 2023-01-17 NOTE — Telephone Encounter (Addendum)
Cardiac Catheterization scheduled at [facility] for: Tuesday January 18, 2023 9 AM Arrival time [facility] Main Entrance A at: 7 AM  Nothing to eat after midnight prior to procedure, clear liquids until 5 AM day of procedure  Medication instructions: -Hold:  Semaglutide-weekly on Mondays-will hold today's dose until post procedure  Metformin-day of procedure and 48 hours post procedure  Torsemide/KCl-AM of procedure -Other usual morning medications can be taken with sips of water including aspirin 81 mg and Plavix 75 mg  Confirmed patient has responsible adult to drive home post procedure and be with patient first 24 hours after arriving home.  Plan to go home the same day, you will only stay overnight if medically necessary.  Reviewed procedure instructions with patient.

## 2023-01-18 ENCOUNTER — Encounter (HOSPITAL_COMMUNITY): Payer: Self-pay | Admitting: Cardiology

## 2023-01-18 ENCOUNTER — Encounter (HOSPITAL_COMMUNITY)
Admission: RE | Disposition: A | Discharge status: Home / Self Care | Payer: Self-pay | Source: Ambulatory Visit | Attending: Cardiology

## 2023-01-18 ENCOUNTER — Ambulatory Visit (HOSPITAL_COMMUNITY)
Admission: RE | Admit: 2023-01-18 | Discharge: 2023-01-18 | Disposition: A | Discharge status: Home / Self Care | Payer: Medicaid Other | Source: Ambulatory Visit | Attending: Cardiology | Admitting: Cardiology

## 2023-01-18 ENCOUNTER — Other Ambulatory Visit: Payer: Self-pay

## 2023-01-18 DIAGNOSIS — E785 Hyperlipidemia, unspecified: Secondary | ICD-10-CM | POA: Insufficient documentation

## 2023-01-18 DIAGNOSIS — N189 Chronic kidney disease, unspecified: Secondary | ICD-10-CM | POA: Diagnosis not present

## 2023-01-18 DIAGNOSIS — F17210 Nicotine dependence, cigarettes, uncomplicated: Secondary | ICD-10-CM | POA: Diagnosis not present

## 2023-01-18 DIAGNOSIS — Z955 Presence of coronary angioplasty implant and graft: Secondary | ICD-10-CM | POA: Insufficient documentation

## 2023-01-18 DIAGNOSIS — I252 Old myocardial infarction: Secondary | ICD-10-CM | POA: Insufficient documentation

## 2023-01-18 DIAGNOSIS — R079 Chest pain, unspecified: Secondary | ICD-10-CM

## 2023-01-18 DIAGNOSIS — I5032 Chronic diastolic (congestive) heart failure: Secondary | ICD-10-CM | POA: Insufficient documentation

## 2023-01-18 DIAGNOSIS — Z79899 Other long term (current) drug therapy: Secondary | ICD-10-CM | POA: Diagnosis not present

## 2023-01-18 DIAGNOSIS — I130 Hypertensive heart and chronic kidney disease with heart failure and stage 1 through stage 4 chronic kidney disease, or unspecified chronic kidney disease: Secondary | ICD-10-CM | POA: Insufficient documentation

## 2023-01-18 DIAGNOSIS — Z7902 Long term (current) use of antithrombotics/antiplatelets: Secondary | ICD-10-CM | POA: Diagnosis not present

## 2023-01-18 DIAGNOSIS — I25110 Atherosclerotic heart disease of native coronary artery with unstable angina pectoris: Secondary | ICD-10-CM

## 2023-01-18 DIAGNOSIS — Z006 Encounter for examination for normal comparison and control in clinical research program: Secondary | ICD-10-CM

## 2023-01-18 HISTORY — PX: LEFT HEART CATH AND CORONARY ANGIOGRAPHY: CATH118249

## 2023-01-18 LAB — PREGNANCY, URINE: Preg Test, Ur: NEGATIVE

## 2023-01-18 LAB — GLUCOSE, CAPILLARY: Glucose-Capillary: 143 mg/dL — ABNORMAL HIGH (ref 70–99)

## 2023-01-18 SURGERY — LEFT HEART CATH AND CORONARY ANGIOGRAPHY
Anesthesia: LOCAL

## 2023-01-18 MED ORDER — FENTANYL CITRATE (PF) 100 MCG/2ML IJ SOLN
INTRAMUSCULAR | Status: AC
  Filled 2023-01-18: qty 2

## 2023-01-18 MED ORDER — ACETAMINOPHEN 325 MG PO TABS: 650.0000 mg | ORAL_TABLET | ORAL | Status: DC | PRN

## 2023-01-18 MED ORDER — ASPIRIN 81 MG PO CHEW
81.0000 mg | CHEWABLE_TABLET | ORAL | Status: DC
Start: 2023-01-19 — End: 2023-01-18

## 2023-01-18 MED ORDER — LIDOCAINE HCL (PF) 1 % IJ SOLN
INTRAMUSCULAR | Status: AC
  Filled 2023-01-18: qty 30

## 2023-01-18 MED ORDER — LIDOCAINE HCL (PF) 1 % IJ SOLN
INTRAMUSCULAR | Status: DC | PRN
  Administered 2023-01-18: 2 mL

## 2023-01-18 MED ORDER — MIDAZOLAM HCL 2 MG/2ML IJ SOLN
INTRAMUSCULAR | Status: AC
  Filled 2023-01-18: qty 2

## 2023-01-18 MED ORDER — SODIUM CHLORIDE 0.9 % WEIGHT BASED INFUSION: 1.0000 mL/kg/h | INTRAVENOUS | Status: DC

## 2023-01-18 MED ORDER — SODIUM CHLORIDE 0.9 % IV SOLN: INTRAVENOUS | Status: DC

## 2023-01-18 MED ORDER — HEPARIN SODIUM (PORCINE) 1000 UNIT/ML IJ SOLN
INTRAMUSCULAR | Status: DC | PRN
  Administered 2023-01-18: 8000 [IU] via INTRAVENOUS

## 2023-01-18 MED ORDER — ASPIRIN 81 MG PO CHEW: 81.0000 mg | CHEWABLE_TABLET | ORAL | Status: DC

## 2023-01-18 MED ORDER — MIDAZOLAM HCL 2 MG/2ML IJ SOLN
INTRAMUSCULAR | Status: DC | PRN
  Administered 2023-01-18: 1 mg via INTRAVENOUS

## 2023-01-18 MED ORDER — LABETALOL HCL 5 MG/ML IV SOLN: 10.0000 mg | INTRAVENOUS | Status: DC | PRN

## 2023-01-18 MED ORDER — SODIUM CHLORIDE 0.9 % IV SOLN: 250.0000 mL | INTRAVENOUS | Status: DC | PRN

## 2023-01-18 MED ORDER — HYDRALAZINE HCL 20 MG/ML IJ SOLN: 10.0000 mg | INTRAMUSCULAR | Status: DC | PRN

## 2023-01-18 MED ORDER — VERAPAMIL HCL 2.5 MG/ML IV SOLN
INTRAVENOUS | Status: AC
  Filled 2023-01-18: qty 2

## 2023-01-18 MED ORDER — SODIUM CHLORIDE 0.9% FLUSH: 3.0000 mL | Freq: Two times a day (BID) | INTRAVENOUS | Status: DC

## 2023-01-18 MED ORDER — ISOSORBIDE MONONITRATE ER 30 MG PO TB24: 30.0000 mg | ORAL_TABLET | Freq: Every day | ORAL | 3 refills | Status: DC

## 2023-01-18 MED ORDER — VERAPAMIL HCL 2.5 MG/ML IV SOLN: INTRAVENOUS | Status: DC | PRN

## 2023-01-18 MED ORDER — HEPARIN SODIUM (PORCINE) 1000 UNIT/ML IJ SOLN
INTRAMUSCULAR | Status: AC
  Filled 2023-01-18: qty 10

## 2023-01-18 MED ORDER — HEPARIN (PORCINE) IN NACL 1000-0.9 UT/500ML-% IV SOLN
INTRAVENOUS | Status: DC | PRN
  Administered 2023-01-18 (×2): 500 mL

## 2023-01-18 MED ORDER — IOHEXOL 350 MG/ML SOLN
INTRAVENOUS | Status: DC | PRN
  Administered 2023-01-18: 75 mL via INTRA_ARTERIAL

## 2023-01-18 MED ORDER — FENTANYL CITRATE (PF) 100 MCG/2ML IJ SOLN
INTRAMUSCULAR | Status: DC | PRN
  Administered 2023-01-18: 25 ug via INTRAVENOUS

## 2023-01-18 MED ORDER — ONDANSETRON HCL 4 MG/2ML IJ SOLN: 4.0000 mg | Freq: Four times a day (QID) | INTRAMUSCULAR | Status: DC | PRN

## 2023-01-18 MED ORDER — SODIUM CHLORIDE 0.9% FLUSH: 3.0000 mL | INTRAVENOUS | Status: DC | PRN

## 2023-01-18 SURGICAL SUPPLY — 13 items
CATH INFINITI 5 FR JL3.5 (CATHETERS) IMPLANT
CATH INFINITI 5FR ANG PIGTAIL (CATHETERS) IMPLANT
CATH OPTITORQUE TIG 4.0 5F (CATHETERS) IMPLANT
DEVICE RAD COMP TR BAND LRG (VASCULAR PRODUCTS) IMPLANT
GLIDESHEATH SLEND SS 6F .021 (SHEATH) IMPLANT
GUIDEWIRE INQWIRE 1.5J.035X260 (WIRE) IMPLANT
INQWIRE 1.5J .035X260CM (WIRE) ×1
KIT HEART LEFT (KITS) ×1 IMPLANT
PACK CARDIAC CATHETERIZATION (CUSTOM PROCEDURE TRAY) ×1 IMPLANT
SHEATH PROBE COVER 6X72 (BAG) IMPLANT
SYR MEDRAD MARK 7 150ML (SYRINGE) ×1 IMPLANT
TRANSDUCER W/STOPCOCK (MISCELLANEOUS) ×1 IMPLANT
TUBING CIL FLEX 10 FLL-RA (TUBING) ×1 IMPLANT

## 2023-01-18 NOTE — Progress Notes (Addendum)
pt states, she is unable to take Imdur because she is on Lyrica, it causes her to have tremors, she states she was on it in the past from Dr Wyline Mood and he had to stop it. I have reached out to Dr Herbie Baltimore for further advice.  Dr Herbie Baltimore stopped IMDUR

## 2023-01-18 NOTE — Research (Addendum)
Selution Informed Consent   Subject Name: [name]  Subject met inclusion and exclusion criteria.  The informed consent form, study requirements and expectations were reviewed with the subject and questions and concerns were addressed prior to the signing of the consent form.  The subject verbalized understanding of the trial requirements.  The subject agreed to participate in the Selution trial and signed the informed consent on 01/18/2023.  The informed consent was obtained prior to performance of any protocol-specific procedures for the subject.  A copy of the signed informed consent was given to the subject and a copy was placed in the subject's medical record.   [name]   Patient has screenfailed

## 2023-01-18 NOTE — Interval H&P Note (Signed)
History and Physical Interval Note:  01/18/2023 9:19 AM  [name]  has presented today for surgery, with the diagnosis of chest pain - progressive angina.  The various methods of treatment have been discussed with the patient and family. After consideration of risks, benefits and other options for treatment, the patient has consented to  Procedure(s): LEFT HEART CATH AND CORONARY ANGIOGRAPHY (N/A)  PERCUTANEOUS CORONARY INTERVENTION  as a surgical intervention.  The patient's history has been reviewed, patient examined, no change in status, stable for surgery.  I have reviewed the patient's chart and labs.  Questions were answered to the patient's satisfaction.    Cath Lab Visit (complete for each Cath Lab visit)  Clinical Evaluation Leading to the Procedure:   ACS: No.  Non-ACS:    Anginal Classification: CCS III  Anti-ischemic medical therapy: Maximal Therapy (2 or more classes of medications)  Non-Invasive Test Results: No non-invasive testing performed  Prior CABG: No previous CABG    Bryan Lemma

## 2023-01-19 LAB — CK TOTAL AND CKMB (NOT AT ARMC)
CK-MB Index: 1.5 ng/mL (ref 0.0–5.3)
Total CK: 61 U/L (ref 32–182)

## 2023-01-28 ENCOUNTER — Other Ambulatory Visit: Payer: Self-pay | Admitting: Physical Medicine and Rehabilitation

## 2023-01-28 MED ORDER — DULOXETINE HCL 60 MG PO CPEP: 60.0000 mg | ORAL_CAPSULE | Freq: Every day | ORAL | 1 refills | Status: DC

## 2023-02-15 ENCOUNTER — Other Ambulatory Visit: Payer: Self-pay | Admitting: Cardiology

## 2023-02-15 ENCOUNTER — Other Ambulatory Visit: Payer: Self-pay | Admitting: Internal Medicine

## 2023-02-16 NOTE — Progress Notes (Signed)
Subjective: CC:DM PCP: Raliegh Ip, DO ZOX:[id number] [name] is a 42 y.o. female presenting to clinic today for:  1. Type 2 Diabetes with hypertension, hyperlipidemia associated with CAD and morbid obesity:  Had cardiac cath in April.  She notes that she has not been able to tolerate the Imdur because it causes tremors when it interacts with her Lyrica and she does not feel that she can go without her Lyrica.  She does have shortness of breath with exertion and that is primarily her symptom.  Does not report any active chest pain currently.  She does report fatigue and wonders if her vitamin Katelyn is low.  Continues to do well with Ozempic.  No hypoglycemic episodes reported.  Her IBS seems to be better with this medication.  Last eye exam: needs Last foot exam: uTD Last A1c:  Lab Results  Component Value Date   HGBA1C 5.9 (H) 10/20/2022   Nephropathy screen indicated?: UTD Last flu, zoster and/or pneumovax:  Immunization History  Administered Date(s) Administered   Influenza Split 10/10/2011   Influenza,inj,Quad PF,6+ Mos 10/28/2017, 06/26/2018, 06/05/2019, 08/19/2020, 06/18/2021, 10/20/2022   Influenza-Unspecified 06/26/2018, 06/05/2019   PFIZER(Purple Top)SARS-COV-2 Vaccination 03/27/2020   Pneumococcal Conjugate-13 10/28/2017   Pneumococcal Polysaccharide-23 10/10/2011   Tdap 01/25/2018    ROS: Per HPI  Allergies  Allergen Reactions   Bee Venom Anaphylaxis and Swelling   Latex Itching   Misc. Sulfonamide Containing Compounds Nausea And Vomiting   Sulfa Drugs Cross Reactors Nausea And Vomiting   Past Medical History:  Diagnosis Date   Allergy    Anemia    Anginal pain (HCC)    Anxiety    Arthritis    CHF (congestive heart failure) (HCC)    Chronic asthmatic bronchitis 08/17/2021   Chronic kidney disease    history of kidney stones   Coronary artery disease 09/2011   a.s/p BMS to LAD and angioplasty of RI in 09/2011 b. patent stent by cath in 02/2012,  12/2013, and 02/2016 with most recent showing 30-40% dLAD and 30-40% RCA stenosis   Depression    Diabetes mellitus    Dyspnea    GERD (gastroesophageal reflux disease)    History of kidney stones    Hyperlipidemia    Hypertension    Midsternal chest pain 03/03/2012   Migraines    Morbid obesity (HCC)    Myocardial infarct (HCC) 09/2011   Myocardial infarction (HCC) 02/2016   Neuromuscular disorder (HCC)    Palpitations 02/17/2012   Ureteral stone with hydronephrosis    Urinary tract obstruction due to kidney stone 02/15/2017    Current Outpatient Medications:    Accu-Chek Softclix Lancets lancets, CHECK BOOD SUGAR FOUR TIMES DAILY Dx E11.9, Disp: 400 each, Rfl: 3   acetaminophen (TYLENOL) 500 MG tablet, Take 1,000 mg by mouth every 6 (six) hours as needed for moderate pain., Disp: , Rfl:    albuterol (VENTOLIN HFA) 108 (90 Base) MCG/ACT inhaler, Inhale 2 puffs into the lungs every 4 (four) hours as needed for wheezing or shortness of breath., Disp: 8 g, Rfl: 2   amLODipine (NORVASC) 5 MG tablet, Take 1 tablet (5 mg total) by mouth daily., Disp: 30 tablet, Rfl: 6   aspirin 81 MG chewable tablet, Chew 81 mg by mouth daily., Disp: , Rfl:    bisoprolol (ZEBETA) 5 MG tablet, One twice daily, Disp: 60 tablet, Rfl: 3   blood glucose meter kit and supplies, Dispense based on patient and insurance preference. Use up to four times  daily as directed. (FOR ICD-10:  E11.22). Check BG daily, Disp: 1 each, Rfl: 0   Blood Glucose Monitoring Suppl (ACCU-CHEK GUIDE) w/Device KIT, Test BS 4 times daily Dx E11.9, Disp: 1 kit, Rfl: 0   budesonide-formoterol (SYMBICORT) 160-4.5 MCG/ACT inhaler, INHALE TWO PUFFS first thing IN THE MORNING AND THEN INHALE another 2 PUFFS about 12 HOURS LATER (Patient taking differently: 1 puff 2 (two) times daily.), Disp: 10.2 g, Rfl: 11   busPIRone (BUSPAR) 10 MG tablet, TAKE 1 TABLET BY MOUTH TWICE DAILY, Disp: 60 tablet, Rfl: 2   clopidogrel (PLAVIX) 75 MG tablet, TAKE 1  TABLET BY MOUTH EVERY DAY, Disp: 90 tablet, Rfl: 0   DULoxetine (CYMBALTA) 60 MG capsule, Take 1 capsule (60 mg total) by mouth at bedtime., Disp: 90 capsule, Rfl: 1   ezetimibe (ZETIA) 10 MG tablet, TAKE 1 TABLET BY MOUTH EVERY DAY, Disp: 90 tablet, Rfl: 2   fenofibrate (TRICOR) 48 MG tablet, TAKE 1 TABLET BY MOUTH DAILY, Disp: 30 tablet, Rfl: 2   FLUoxetine (PROZAC) 20 MG capsule, TAKE ONE CAPSULE BY MOUTH EVERY DAY. GIVE WITH CYMBALTA DUE TO NERVE PAIN AND ANXIETY/DEPRESSION, Disp: 90 capsule, Rfl: 1   glucose blood (ACCU-CHEK GUIDE) test strip, Test BS 4 times daily Dx E11.9, Disp: 400 each, Rfl: 3   isosorbide mononitrate (IMDUR) 30 MG 24 hr tablet, Take 1 tablet (30 mg total) by mouth daily., Disp: 90 tablet, Rfl: 3   losartan (COZAAR) 25 MG tablet, TAKE 1 TABLET BY MOUTH DAILY, Disp: 90 tablet, Rfl: 2   medroxyPROGESTERone (PROVERA) 5 MG tablet, TAKE 1 TABLET BY MOUTH DAILY, Disp: 90 tablet, Rfl: 0   metFORMIN (GLUCOPHAGE) 1000 MG tablet, TAKE 1 TABLET BY MOUTH TWICE DAILY, Disp: 180 tablet, Rfl: 0   nitroGLYCERIN (NITROSTAT) 0.4 MG SL tablet, DISSOLVE 1 TABLET UNDER THE TONGUE EVERY 5 MINUTES AS NEEDED FOR CHEST PAIN. DO NOT EXCEED A TOTAL OF 3 DOSES IN 15 MINUTES., Disp: 25 tablet, Rfl: 3   potassium chloride SA (KLOR-CON M) 20 MEQ tablet, TAKE 1 TABLET BY MOUTH EVERY DAY AS NEEDED (when taking torsemide), Disp: 90 tablet, Rfl: 3   pregabalin (LYRICA) 150 MG capsule, Take 1 capsule (150 mg total) by mouth 2 (two) times daily., Disp: 60 capsule, Rfl: 5   ranolazine (RANEXA) 1000 MG SR tablet, TAKE 1 TABLET BY MOUTH TWICE DAILY, Disp: 180 tablet, Rfl: 0   REPATHA SURECLICK 140 MG/ML SOAJ, INJECT 140 MG INTO THE SKIN EVERY 14 DAYS, Disp: 2 mL, Rfl: 6   rosuvastatin (CRESTOR) 40 MG tablet, TAKE 1 TABLET BY MOUTH DAILY, Disp: 30 tablet, Rfl: 2   Semaglutide, 1 MG/DOSE, 4 MG/3ML SOPN, Inject 1 mg as directed once a week. To REPLACE 0.5mg  Ozempic, Disp: 9 mL, Rfl: 3   torsemide (DEMADEX) 20 MG  tablet, TAKE 2 TABLETS BY MOUTH DAILY AS NEEDED FOR swelling, Disp: 180 tablet, Rfl: 3   traMADol (ULTRAM) 50 MG tablet, Take 2 tablets (100 mg total) by mouth 2 (two) times daily., Disp: 120 tablet, Rfl: 5   VASCEPA 1 g capsule, TAKE TWO CAPSULES BY MOUTH TWICE DAILY, Disp: 360 capsule, Rfl: 0 Social History   Socioeconomic History   Marital status: Married    Spouse name: Ree Kida   Number of children: 2   Years of education: 10   Highest education level: Not on file  Occupational History   Not on file  Tobacco Use   Smoking status: Some Days    Packs/day: 0.50  Years: 30.00    Additional pack years: 0.00    Total pack years: 15.00    Types: Cigarettes    Start date: 03/18/1990   Smokeless tobacco: Never   Tobacco comments:    Vapes currently   Vaping Use   Vaping Use: Some days   Substances: Nicotine  Substance and Sexual Activity   Alcohol use: Not Currently    Alcohol/week: 0.0 standard drinks of alcohol    Comment: maybe once a year   Drug use: No   Sexual activity: Yes    Birth control/protection: None    Comment: PCOS  Other Topics Concern   Not on file  Social History Narrative   Unemployed   Applied for disability   Leisure "take care of house and kids"   Walks when able      She is working to get on disability. She previously worked on Production designer, theatre/television/film.      Social Determinants of Health   Financial Resource Strain: Low Risk  (10/28/2017)   Overall Financial Resource Strain (CARDIA)    Difficulty of Paying Living Expenses: Not hard at all  Food Insecurity: No Food Insecurity (11/12/2022)   Hunger Vital Sign    Worried About Running Out of Food in the Last Year: Never true    Ran Out of Food in the Last Year: Never true  Transportation Needs: No Transportation Needs (11/12/2022)   PRAPARE - Administrator, Civil Service (Medical): No    Lack of Transportation (Non-Medical): No  Physical Activity: Inactive (10/28/2017)   Exercise Vital Sign     Days of Exercise per Week: 0 days    Minutes of Exercise per Session: 0 min  Stress: Stress Concern Present (10/28/2017)   [employer] of Occupational Health - Occupational Stress Questionnaire    Feeling of Stress : To some extent  Social Connections: Somewhat Isolated (10/28/2017)   Social Connection and Isolation Panel [NHANES]    Frequency of Communication with Friends and Family: More than three times a week    Frequency of Social Gatherings with Friends and Family: More than three times a week    Attends Religious Services: Never    Database administrator or Organizations: No    Attends Banker Meetings: Never    Marital Status: Married  Catering manager Violence: Not At Risk (11/12/2022)   Humiliation, Afraid, Rape, and Kick questionnaire    Fear of Current or Ex-Partner: No    Emotionally Abused: No    Physically Abused: No    Sexually Abused: No   Family History  Problem Relation Age of Onset   Heart murmur Father    Diabetes Father    Hypertension Father    Hyperlipidemia Father    Cancer Father        throat   Diabetes Mother    Cancer Mother        breast.uterine, ovarian   Asthma Son    Appendicitis Son    Hernia Son    Mental illness Son        Bipolar, personality Katelyn/o   Stroke Maternal Grandmother    Cancer Maternal Grandmother        breast   Cancer Paternal Grandfather        lung   Colon cancer Neg Hx     Objective: Office vital signs reviewed. BP 103/71   Pulse 79   Temp (!) 97.2 F (36.2 C) (Temporal)   Ht 5'  10" (1.778 m)   Wt (!) 379 lb 2 oz (172 kg)   SpO2 98%   BMI 54.40 kg/m   Physical Examination:  General: Awake, alert, morbidly obese, No acute distress HEENT: sclera white, MMM Cardio: regular rate and rhythm, S1S2 heard, no murmurs appreciated Pulm: clear to auscultation bilaterally, no wheezes, rhonchi or rales; normal work of breathing on room air MSK: Ambulating independently.  Assessment/ Plan: 42 y.o.  female   Type 2 diabetes mellitus with other specified complication, without long-term current use of insulin (HCC) - Plan: Bayer DCA Hb A1c Waived, Microalbumin / creatinine urine ratio  Hypertension associated with diabetes (HCC)  Hyperlipidemia associated with type 2 diabetes mellitus (HCC)  Diabetic neuropathy, painful (HCC)  Coronary artery disease involving native coronary artery of native heart without angina pectoris  BMI 50.0-59.9, adult (HCC)  Check urine microalbumin.  Sugar is controlled with A1c of 6.5 today.  Continue current regimen  Blood pressure borderline low.  She is actively hydrating.  I will CC cardiology as FYI.  Not sure if they might want to back down on any of the meds  Continue cholesterol control with Repatha  Continue Lyrica as directed, tramadol as needed per pain management  Continue follow-up with cardiology as directed.  Discussed need for smoking cessation  No orders of the defined types were placed in this encounter.  No orders of the defined types were placed in this encounter.    Raliegh Ip, DO [facility] [phone]

## 2023-02-18 ENCOUNTER — Ambulatory Visit (INDEPENDENT_AMBULATORY_CARE_PROVIDER_SITE_OTHER): Payer: Medicaid Other | Admitting: Family Medicine

## 2023-02-18 ENCOUNTER — Encounter: Payer: Self-pay | Admitting: Family Medicine

## 2023-02-18 VITALS — BP 103/71 | HR 79 | Temp 97.2°F | Ht 70.0 in | Wt 379.1 lb

## 2023-02-18 DIAGNOSIS — Z6841 Body Mass Index (BMI) 40.0 and over, adult: Secondary | ICD-10-CM | POA: Diagnosis not present

## 2023-02-18 DIAGNOSIS — E785 Hyperlipidemia, unspecified: Secondary | ICD-10-CM

## 2023-02-18 DIAGNOSIS — Z7985 Long-term (current) use of injectable non-insulin antidiabetic drugs: Secondary | ICD-10-CM | POA: Diagnosis not present

## 2023-02-18 DIAGNOSIS — I152 Hypertension secondary to endocrine disorders: Secondary | ICD-10-CM

## 2023-02-18 DIAGNOSIS — I2510 Atherosclerotic heart disease of native coronary artery without angina pectoris: Secondary | ICD-10-CM | POA: Diagnosis not present

## 2023-02-18 DIAGNOSIS — E1140 Type 2 diabetes mellitus with diabetic neuropathy, unspecified: Secondary | ICD-10-CM

## 2023-02-18 DIAGNOSIS — E1159 Type 2 diabetes mellitus with other circulatory complications: Secondary | ICD-10-CM

## 2023-02-18 DIAGNOSIS — E1169 Type 2 diabetes mellitus with other specified complication: Secondary | ICD-10-CM | POA: Diagnosis not present

## 2023-02-18 LAB — BAYER DCA HB A1C WAIVED: HB A1C (BAYER DCA - WAIVED): 6.5 % — ABNORMAL HIGH (ref 4.8–5.6)

## 2023-02-19 LAB — MICROALBUMIN / CREATININE URINE RATIO
Creatinine, Urine: 155.5 mg/dL
Microalb/Creat Ratio: 125 mg/g creat — ABNORMAL HIGH (ref 0–29)
Microalbumin, Urine: 195 ug/mL

## 2023-02-21 ENCOUNTER — Other Ambulatory Visit: Payer: Self-pay | Admitting: Cardiology

## 2023-02-25 ENCOUNTER — Ambulatory Visit: Payer: Medicaid Other | Admitting: Student

## 2023-03-01 ENCOUNTER — Encounter: Payer: Self-pay | Admitting: Family Medicine

## 2023-03-02 ENCOUNTER — Encounter: Payer: Self-pay | Admitting: Physical Medicine and Rehabilitation

## 2023-03-02 ENCOUNTER — Encounter
Payer: Medicaid Other | Attending: Physical Medicine and Rehabilitation | Admitting: Physical Medicine and Rehabilitation

## 2023-03-02 VITALS — BP 138/76 | HR 86 | Ht 70.0 in | Wt 370.0 lb

## 2023-03-02 DIAGNOSIS — M7918 Myalgia, other site: Secondary | ICD-10-CM | POA: Diagnosis not present

## 2023-03-02 DIAGNOSIS — M797 Fibromyalgia: Secondary | ICD-10-CM | POA: Diagnosis not present

## 2023-03-02 DIAGNOSIS — Z6841 Body Mass Index (BMI) 40.0 and over, adult: Secondary | ICD-10-CM | POA: Insufficient documentation

## 2023-03-02 DIAGNOSIS — G43709 Chronic migraine without aura, not intractable, without status migrainosus: Secondary | ICD-10-CM | POA: Insufficient documentation

## 2023-03-02 MED ORDER — LIDOCAINE HCL 1 % IJ SOLN
9.0000 mL | Freq: Once | INTRAMUSCULAR | Status: AC
Start: 2023-03-02 — End: 2023-03-02
  Administered 2023-03-02: 9 mL

## 2023-03-02 NOTE — Patient Instructions (Signed)
Plan: Patient here for trigger point injections for  Consent done and on chart.   Cleaned areas with alcohol and injected using a 27 gauge 1.5 inch needle   Injected 8.5 cc- wasted 0.5cc Using 1% Lidocaine with no EPI   Upper traps B/L  Levators- B/L  Posterior scalenes- B/ L Middle scalenes- B/L  Splenius Capitus- B/L  Pectoralis Major- B/L Rhomboids B/L  Infraspinatus Teres Major/minor Thoracic paraspinals B/L  Lumbar paraspinals B/L x2 Other injections-  /L forearms and B/L hands 1st dorsal interossei     Patient's level of pain prior was 8/10 Current level of pain after injections is- neck is already much better- the rest is about 6-7/10    There was no bleeding or complications.   Patient was advised to drink a lot of water on day after injections to flush system Will have increased soreness for 12-48 hours after injections.  Can use Lidocaine patches the day AFTER injections Can use theracane on day of injections in places didn't inject Can use heating pad 4-6 hours AFTER injections   2. Con't Duloxetine 60 mg daily and Lyrica 150 mg BID- doesn't need refills today.    3. Doesn't need refill on tramadol- last filled 3/25   4. Last UDS 12/20/22- - was good- doesn't need today.    5. F/U in 3 months- for TrP injections and f/u

## 2023-03-02 NOTE — Progress Notes (Signed)
Pt is a 42 yr old female with BMI of 56- weight 390 lbs, DM, GAD, HTN,  and  CHF- chronic pain- Appears to have L T8 foraminal thoracic stenosis.  As well as fibromyalgia and myofascial pain syndrome.   Not a lot- but stressed, which makes her hurt, so needs trigger point injections.   Knows it's time to see me when cannot turn head enough to see cars on Right especially- due to reduction in ROM of neck.   When had injections in forearms, made numbness better in arms, not hands though   Plan: Patient here for trigger point injections for  Consent done and on chart.  Cleaned areas with alcohol and injected using a 27 gauge 1.5 inch needle  Injected 8.5 cc- wasted 0.5cc Using 1% Lidocaine with no EPI  Upper traps B/L  Levators- B/L  Posterior scalenes- B/ L Middle scalenes- B/L  Splenius Capitus- B/L  Pectoralis Major- B/L Rhomboids B/L  Infraspinatus Teres Major/minor Thoracic paraspinals B/L  Lumbar paraspinals B/L x2 Other injections-  /L forearms and B/L hands 1st dorsal interossei   Patient's level of pain prior was 8/10 Current level of pain after injections is- neck is already much better- the rest is about 6-7/10   There was no bleeding or complications.  Patient was advised to drink a lot of water on day after injections to flush system Will have increased soreness for 12-48 hours after injections.  Can use Lidocaine patches the day AFTER injections Can use theracane on day of injections in places didn't inject Can use heating pad 4-6 hours AFTER injections  2. Con't Duloxetine 60 mg daily and Lyrica 150 mg BID- doesn't need refills today.   3. Doesn't need refill on tramadol- last filled 3/25  4. Last UDS 12/20/22- - was good- doesn't need today.   5. F/U in 3 months- for TrP injections and f/u

## 2023-03-11 DIAGNOSIS — H5213 Myopia, bilateral: Secondary | ICD-10-CM | POA: Diagnosis not present

## 2023-03-17 ENCOUNTER — Other Ambulatory Visit: Payer: Self-pay | Admitting: Family Medicine

## 2023-03-17 DIAGNOSIS — F418 Other specified anxiety disorders: Secondary | ICD-10-CM

## 2023-03-17 DIAGNOSIS — F411 Generalized anxiety disorder: Secondary | ICD-10-CM

## 2023-03-17 DIAGNOSIS — E1169 Type 2 diabetes mellitus with other specified complication: Secondary | ICD-10-CM

## 2023-03-18 ENCOUNTER — Other Ambulatory Visit: Payer: Self-pay | Admitting: Family Medicine

## 2023-03-18 DIAGNOSIS — E1169 Type 2 diabetes mellitus with other specified complication: Secondary | ICD-10-CM

## 2023-03-30 ENCOUNTER — Telehealth: Payer: Self-pay | Admitting: *Deleted

## 2023-03-30 NOTE — Telephone Encounter (Signed)
Tramadol PA done Yanette Grivas (Key: [facility]) PA Case ID #: [id number] Rx #: [id number]

## 2023-04-04 ENCOUNTER — Telehealth: Payer: Self-pay | Admitting: Physical Medicine and Rehabilitation

## 2023-04-04 NOTE — Telephone Encounter (Signed)
Patients husband called and states patient has not been able to receive her Tramadol , patient was told by pharmacy that they have not received PA , and also mentioned that all her other medications are in the same package and pharamcy will not release any of patient meds without PA for Tramadol and patient has been out of medications

## 2023-04-04 NOTE — Telephone Encounter (Signed)
Patient's spouse notified PA pending since 03/30/23.

## 2023-04-05 NOTE — Telephone Encounter (Signed)
Called [facility] after speaking with spouse. Dx code chronic migraine used. 23 hrs turnaround per Medicaid but submitted high priority. Case ID [id number]    75 PLAN LIMITATION EXCEEDED 5 DAYS SUPPLY.CALL [phone] OR SUBMIT PA TO [email or web address] 3 DS O/R, USE [facility] [id number] DRUG REQUIRES PRIOR AUTHORIZATION For [employer] Coupon Price of: $42.96 submit to BIN: [id number] PCN: CP Group: [id number] --Service provided at no cost and no switch fee to the pharmacy-

## 2023-04-05 NOTE — Telephone Encounter (Signed)
Tramadol HCL 50 MG denied.

## 2023-04-05 NOTE — Telephone Encounter (Signed)
Patient's insurance denied Tramadol.   [name] ([name]) - [id number] traMADol HCl 50MG  tablets Status: PA Response - DeniedCreated: July 3rd, 2024 [id number] Sent: July 3rd, 2024

## 2023-04-05 NOTE — Telephone Encounter (Signed)
PA has been resubmitted

## 2023-04-07 ENCOUNTER — Telehealth: Payer: Self-pay | Admitting: *Deleted

## 2023-04-07 ENCOUNTER — Ambulatory Visit: Payer: Medicaid Other | Attending: Student | Admitting: Cardiology

## 2023-04-07 VITALS — BP 129/86 | HR 80 | Ht 70.0 in | Wt 371.8 lb

## 2023-04-07 DIAGNOSIS — I10 Essential (primary) hypertension: Secondary | ICD-10-CM | POA: Diagnosis not present

## 2023-04-07 DIAGNOSIS — I5032 Chronic diastolic (congestive) heart failure: Secondary | ICD-10-CM

## 2023-04-07 DIAGNOSIS — I25119 Atherosclerotic heart disease of native coronary artery with unspecified angina pectoris: Secondary | ICD-10-CM

## 2023-04-07 DIAGNOSIS — E782 Mixed hyperlipidemia: Secondary | ICD-10-CM | POA: Diagnosis not present

## 2023-04-07 NOTE — Patient Instructions (Signed)
Medication Instructions:  Your physician recommends that you continue on your current medications as directed. Please refer to the Current Medication list given to you today.  *If you need a refill on your cardiac medications before your next appointment, please call your pharmacy*   Lab Work: None If you have labs (blood work) drawn today and your tests are completely normal, you will receive your results only by: MyChart Message (if you have MyChart) OR A paper copy in the mail If you have any lab test that is abnormal or we need to change your treatment, we will call you to review the results.   Testing/Procedures: None   Follow-Up: At [facility], you and your health needs are our priority.  As part of our continuing mission to provide you with exceptional heart care, we have created designated Provider Care Teams.  These Care Teams include your primary Cardiologist (physician) and Advanced Practice Providers (APPs -  Physician Assistants and Nurse Practitioners) who all work together to provide you with the care you need, when you need it.  We recommend signing up for the patient portal called "MyChart".  Sign up information is provided on this After Visit Summary.  MyChart is used to connect with patients for Virtual Visits (Telemedicine).  Patients are able to view lab/test results, encounter notes, upcoming appointments, etc.  Non-urgent messages can be sent to your provider as well.   To learn more about what you can do with MyChart, go to [email or web address].    Your next appointment:   6 month(s)  Provider:   You may see Branch, Jonathan, MD or one of the following Advanced Practice Providers on your designated Care Team:   Brittany Strader, PA-C  Michele Lenze, PA-C     Other Instructions    

## 2023-04-07 NOTE — Progress Notes (Signed)
Clinical Summary [name] is a 42 y.o.female seen today for follow up of the following medical problems.      1. CAD - prior anterior MI 09/2011, 95% lesion in mid LAD and ramus with 95% ostial stenosis. LVEF 55% by LV gram, apical hypokinesis. BMS to mid LAD and cutting balloon angioplasty of ramus.   - repeat cath 02/2012 with patent vessels and stent - 09/2011 Echo: LVEF 50% - 12/2013 Lexiscan large anteroseptal ischemia - cath 12/2013 with patent vessels  - cath 02/2016 [facility] in setting of NSTEMI showed LM patent, LAD patent with patent stent, distal LAD 30-40%, LCX patent, RCA 30-40%. LVEDP 40, PCWP 30 mean PA 32, CI 4.93 - echo 02/2016 [place] LVEF 50%, apical akinesis       - 07/2018 cath mild to moderate disease other than small D1 99% too small for PCI. Normal LVEDP - we tried imdur 15mg  daily. Reported some episodes of shaking on this medication. Changed to ranexa     12/2019 admitted with NSTEMI - cath with LM patent, ostial LAD 40%, D1 99%, ramus ostial 90%, LCX patent, RCA prox 50%. Received DES to ramus        12/2022 cath: LM normal, ostial LAD 40%, mid LAD 99% and mid to distal 95%. Ostial D1 99%, ramus patent stents, LCX minimal LIs, RCA prox to distal 50%. LAD not favorable for PCI -denies significant chest pains. Compliant with meds.     2. Chronic diastolic HF - no recent edema.    3. HTN -compliant withmeds   4. Hyperlipidemia  - she is on crestor 40, repatha, zetia 10mg  daily - followed by lipid clinic  - Jan 2024 TC 113 TG 123 HDL 37 LDL 54  - complaint with meds     Past Medical History:  Diagnosis Date   Allergy    Anemia    Anginal pain (HCC)    Anxiety    Arthritis    CHF (congestive heart failure) (HCC)    Chronic asthmatic bronchitis 08/17/2021   Chronic kidney disease    history of kidney stones   Coronary artery disease 09/2011   a.s/p BMS to LAD and angioplasty of RI in 09/2011 b. patent stent by cath in 02/2012, 12/2013,  and 02/2016 with most recent showing 30-40% dLAD and 30-40% RCA stenosis   Depression    Diabetes mellitus    Dyspnea    GERD (gastroesophageal reflux disease)    History of kidney stones    Hyperlipidemia    Hypertension    Midsternal chest pain 03/03/2012   Migraines    Morbid obesity (HCC)    Myocardial infarct (HCC) 09/2011   Myocardial infarction (HCC) 02/2016   Neuromuscular disorder (HCC)    Palpitations 02/17/2012   Ureteral stone with hydronephrosis    Urinary tract obstruction due to kidney stone 02/15/2017     Allergies  Allergen Reactions   Bee Venom Anaphylaxis and Swelling   Latex Itching   Misc. Sulfonamide Containing Compounds Nausea And Vomiting   Sulfa Drugs Cross Reactors Nausea And Vomiting     Current Outpatient Medications  Medication Sig Dispense Refill   Accu-Chek Softclix Lancets lancets CHECK BOOD SUGAR FOUR TIMES DAILY Dx E11.9 400 each 3   acetaminophen (TYLENOL) 500 MG tablet Take 1,000 mg by mouth every 6 (six) hours as needed for moderate pain.     albuterol (VENTOLIN HFA) 108 (90 Base) MCG/ACT inhaler Inhale 2 puffs into the lungs every 4 (  four) hours as needed for wheezing or shortness of breath. 8 g 2   amLODipine (NORVASC) 5 MG tablet Take 1 tablet (5 mg total) by mouth daily. 30 tablet 6   aspirin 81 MG chewable tablet Chew 81 mg by mouth daily.     bisoprolol (ZEBETA) 5 MG tablet TAKE 1 TABLET BY MOUTH TWICE DAILY 60 tablet 3   blood glucose meter kit and supplies Dispense based on patient and insurance preference. Use up to four times daily as directed. (FOR ICD-10:  E11.22). Check BG daily 1 each 0   Blood Glucose Monitoring Suppl (ACCU-CHEK GUIDE) w/Device KIT Test BS 4 times daily Dx E11.9 1 kit 0   budesonide-formoterol (SYMBICORT) 160-4.5 MCG/ACT inhaler INHALE TWO PUFFS first thing IN THE MORNING AND THEN INHALE another 2 PUFFS about 12 HOURS LATER (Patient taking differently: 1 puff 2 (two) times daily.) 10.2 g 11   busPIRone  (BUSPAR) 10 MG tablet TAKE 1 TABLET BY MOUTH TWICE DAILY 60 tablet 2   clopidogrel (PLAVIX) 75 MG tablet TAKE 1 TABLET BY MOUTH EVERY DAY 90 tablet 0   DULoxetine (CYMBALTA) 60 MG capsule Take 1 capsule (60 mg total) by mouth at bedtime. 90 capsule 1   ezetimibe (ZETIA) 10 MG tablet TAKE 1 TABLET BY MOUTH EVERY DAY 90 tablet 2   fenofibrate (TRICOR) 48 MG tablet TAKE 1 TABLET BY MOUTH DAILY 30 tablet 2   FLUoxetine (PROZAC) 20 MG capsule TAKE ONE CAPSULE BY MOUTH EVERY DAY. GIVE WITH CYMBALTA DUE TO NERVE PAIN AND ANXIETY/DEPRESSION 90 capsule 1   glucose blood (ACCU-CHEK GUIDE) test strip Test BS 4 times daily Dx E11.9 400 each 3   isosorbide mononitrate (IMDUR) 30 MG 24 hr tablet Take 1 tablet (30 mg total) by mouth daily. 90 tablet 3   losartan (COZAAR) 25 MG tablet TAKE 1 TABLET BY MOUTH DAILY 90 tablet 2   medroxyPROGESTERone (PROVERA) 5 MG tablet TAKE 1 TABLET BY MOUTH DAILY 90 tablet 0   metFORMIN (GLUCOPHAGE) 1000 MG tablet TAKE 1 TABLET BY MOUTH TWICE DAILY 180 tablet 0   nitroGLYCERIN (NITROSTAT) 0.4 MG SL tablet DISSOLVE 1 TABLET UNDER THE TONGUE EVERY 5 MINUTES AS NEEDED FOR CHEST PAIN. DO NOT EXCEED A TOTAL OF 3 DOSES IN 15 MINUTES. 25 tablet 3   potassium chloride SA (KLOR-CON M) 20 MEQ tablet TAKE 1 TABLET BY MOUTH EVERY DAY AS NEEDED (when taking torsemide) 90 tablet 3   pregabalin (LYRICA) 150 MG capsule Take 1 capsule (150 mg total) by mouth 2 (two) times daily. 60 capsule 5   ranolazine (RANEXA) 1000 MG SR tablet TAKE 1 TABLET BY MOUTH TWICE DAILY 180 tablet 0   REPATHA SURECLICK 140 MG/ML SOAJ INJECT 140 MG INTO THE SKIN EVERY 14 DAYS 2 mL 6   rosuvastatin (CRESTOR) 40 MG tablet TAKE 1 TABLET BY MOUTH DAILY 30 tablet 2   Semaglutide, 1 MG/DOSE, (OZEMPIC, 1 MG/DOSE,) 4 MG/3ML SOPN INJECT 1 MG WEEKLY (TO REPLACE 0.5MG  OZEMPIC) 9 mL 0   torsemide (DEMADEX) 20 MG tablet TAKE 2 TABLETS BY MOUTH DAILY AS NEEDED FOR swelling 180 tablet 3   traMADol (ULTRAM) 50 MG tablet Take 2 tablets  (100 mg total) by mouth 2 (two) times daily. 120 tablet 5   VASCEPA 1 g capsule TAKE TWO CAPSULES BY MOUTH TWICE DAILY 360 capsule 0   No current facility-administered medications for this visit.     Past Surgical History:  Procedure Laterality Date   BIOPSY  10/27/2020  Procedure: BIOPSY;  Surgeon: Corbin Ade, MD;  Location: [facility];  Service: Endoscopy;;   CARDIAC CATHETERIZATION  10/08/2011   LAD: 95% mid, Ramus: 95% ostial   CARPAL TUNNEL RELEASE     CESAREAN SECTION     CHOLECYSTECTOMY     CORONARY ANGIOPLASTY WITH STENT PLACEMENT  10/08/2011   LAD: BMS, Ramus: cutting balloon angioplasty   CORONARY STENT INTERVENTION N/A 01/14/2020   Procedure: CORONARY STENT INTERVENTION;  Surgeon: Marykay Lex, MD;  Location: [facility] INVASIVE CV LAB;  Service: Cardiovascular;  Laterality: N/A;   CYSTOSCOPY WITH RETROGRADE PYELOGRAM, URETEROSCOPY AND STENT PLACEMENT Left 02/16/2017   Procedure: CYSTOSCOPY WITH RETROGRADE PYELOGRAM, URETEROSCOPY AND STENT PLACEMENT;  Surgeon: Ihor Gully, MD;  Location: [facility] ORS;  Service: Urology;  Laterality: Left;   CYSTOSCOPY WITH RETROGRADE PYELOGRAM, URETEROSCOPY AND STENT PLACEMENT Left 03/28/2017   Procedure: LEFT STENT REMOVAL LEFT RETROGRADE PYELOGRAM LEFT URETEROSCOPY   LASER LITHOTRIPSY  AND  STENT REPLACEMENT;  Surgeon: Malen Gauze, MD;  Location: [facility];  Service: Urology;  Laterality: Left;   CYSTOSCOPY WITH RETROGRADE PYELOGRAM, URETEROSCOPY AND STENT PLACEMENT Left 04/13/2022   Procedure: CYSTOSCOPY WITH RETROGRADE PYELOGRAM, URETEROSCOPY AND STENT PLACEMENT, BASKET EXTRACTION;  Surgeon: Malen Gauze, MD;  Location: [facility];  Service: Urology;  Laterality: Left;   ESOPHAGOGASTRODUODENOSCOPY (EGD) WITH PROPOFOL N/A 10/27/2020   Rourk: mild erosive reflux esophagitis, h.pylori gastritis   LEFT HEART CATH AND CORONARY ANGIOGRAPHY N/A 08/15/2018   Procedure: LEFT HEART CATH AND CORONARY ANGIOGRAPHY;  Surgeon: Swaziland, Peter M,  MD;  Location: [facility] INVASIVE CV LAB;  Service: Cardiovascular;  Laterality: N/A;   LEFT HEART CATH AND CORONARY ANGIOGRAPHY N/A 01/14/2020   Procedure: LEFT HEART CATH AND CORONARY ANGIOGRAPHY;  Surgeon: Marykay Lex, MD;  Location: [facility] INVASIVE CV LAB;  Service: Cardiovascular;  Laterality: N/A;   LEFT HEART CATH AND CORONARY ANGIOGRAPHY N/A 01/18/2023   Procedure: LEFT HEART CATH AND CORONARY ANGIOGRAPHY;  Surgeon: Marykay Lex, MD;  Location: [facility] INVASIVE CV LAB;  Service: Cardiovascular;  Laterality: N/A;   LEFT HEART CATHETERIZATION WITH CORONARY ANGIOGRAM N/A 10/08/2011   Procedure: LEFT HEART CATHETERIZATION WITH CORONARY ANGIOGRAM;  Surgeon: Peter M Swaziland, MD;  Location: [facility] CATH LAB;  Service: Cardiovascular;  Laterality: N/A;   LEFT HEART CATHETERIZATION WITH CORONARY ANGIOGRAM N/A 03/02/2012   Procedure: LEFT HEART CATHETERIZATION WITH CORONARY ANGIOGRAM;  Surgeon: Kathleene Hazel, MD;  Location: [facility] CATH LAB;  Service: Cardiovascular;  Laterality: N/A;   LEFT HEART CATHETERIZATION WITH CORONARY ANGIOGRAM N/A 01/04/2014   Procedure: LEFT HEART CATHETERIZATION WITH CORONARY ANGIOGRAM;  Surgeon: Lennette Bihari, MD;  Location: [facility] CATH LAB;  Service: Cardiovascular;  Laterality: N/A;   right hand pinky surgery       Allergies  Allergen Reactions   Bee Venom Anaphylaxis and Swelling   Latex Itching   Misc. Sulfonamide Containing Compounds Nausea And Vomiting   Sulfa Drugs Cross Reactors Nausea And Vomiting      Family History  Problem Relation Age of Onset   Heart murmur Father    Diabetes Father    Hypertension Father    Hyperlipidemia Father    Cancer Father        throat   Diabetes Mother    Cancer Mother        breast.uterine, ovarian   Asthma Son    Appendicitis Son    Hernia Son    Mental illness Son        Bipolar, personality d/o  Stroke Maternal Grandmother    Cancer Maternal Grandmother        breast   Cancer Paternal Grandfather        lung    Colon cancer Neg Hx      Social History Ms. Hartland reports that she has been smoking cigarettes. She started smoking about 33 years ago. She has a 16.5 pack-year smoking history. She has never used smokeless tobacco. Ms. Morath reports that she does not currently use alcohol.   Review of Systems CONSTITUTIONAL: No weight loss, fever, chills, weakness or fatigue.  HEENT: Eyes: No visual loss, blurred vision, double vision or yellow sclerae.No hearing loss, sneezing, congestion, runny nose or sore throat.  SKIN: No rash or itching.  CARDIOVASCULAR: per hpi RESPIRATORY: No shortness of breath, cough or sputum.  GASTROINTESTINAL: No anorexia, nausea, vomiting or diarrhea. No abdominal pain or blood.  GENITOURINARY: No burning on urination, no polyuria NEUROLOGICAL: No headache, dizziness, syncope, paralysis, ataxia, numbness or tingling in the extremities. No change in bowel or bladder control.  MUSCULOSKELETAL: No muscle, back pain, joint pain or stiffness.  LYMPHATICS: No enlarged nodes. No history of splenectomy.  PSYCHIATRIC: No history of depression or anxiety.  ENDOCRINOLOGIC: No reports of sweating, cold or heat intolerance. No polyuria or polydipsia.  Marland Kitchen   Physical Examination Today's Vitals   04/07/23 1047  BP: 129/86  Pulse: 80  SpO2: 98%  Weight: (!) 371 lb 12.8 oz (168.6 kg)  Height: 5\' 10"  (1.778 m)   Body mass index is 53.35 kg/m.  Gen: resting comfortably, no acute distress HEENT: no scleral icterus, pupils equal round and reactive, no palptable cervical adenopathy,  CV: RRR, no m/rg, no jvd Resp: Clear to auscultation bilaterally GI: abdomen is soft, non-tender, non-distended, normal bowel sounds, no hepatosplenomegaly MSK: extremities are warm, no edema.  Skin: warm, no rash Neuro:  no focal deficits Psych: appropriate affect   Diagnostic Studies  02/2012 Cath Hemodynamic Findings:   Central aortic pressure: 108/70   Left ventricular pressure: 123/10/21    Angiographic Findings:   Left main: No obstructive disease noted.   Left Anterior Descending Artery: Large caliber vessel that courses to the apex. There is a stent present in the mid vessel that is widely patent with no restenosis. The remainder of the LAD is disease free. There is a moderate sized diagonal Cydney Alvarenga with 30% proximal stenosis.   Circumflex Artery: Dominant, large caliber vessel with no disease throughout the AV groove segment. There is small to moderate sized intermediate Laiklyn Pilkenton that has ostial 30% stenosis. The remainder of the Circumflex has no obstructive disease.   Right Coronary Artery: Small, non-dominant vessel. No disease noted.   Left Ventricular Angiogram: LVEF 50-55%.   Impression:   1. Double vessel CAD with patent stent mid LAD and patent angioplasty site at ostium of intermediate Alois Mincer.   2. Normal LV systolic function     09/2011 Cath PROCEDURAL FINDINGS   Hemodynamics:   AO 135/92 with a mean of 114 mmHg   LV 136/25 mmHg   Coronary angiography:   Coronary dominance: Left   Left mainstem: Normal.   Left anterior descending (LAD): There is a 95% stenosis in the mid LAD immediately after the takeoff of the first diagonal. The first diagonal Kelson Queenan is moderate in size and appears normal. The LAD stenosis does appear to be hazy.   There is a moderately large ramus intermediate Salvador Bigbee which has a 95% ostial stenosis.   Left circumflex (LCx): This is a  dominant vessel and appears normal throughout.   Right coronary artery (RCA): This is a small nondominant vessel and is normal.   Left ventriculography: Left ventricular systolic function is abnormal with apical hypokinesis. LVEF is estimated at 55%, there is no significant mitral regurgitation   PCI Note: Following the diagnostic procedure, the decision was made to proceed with PCI of the LAD. Weight-based bivalirudin was given for anticoagulation. Once a therapeutic ACT was achieved, a 5 [place] EBU guide catheter was  inserted. A pro-water coronary guidewire was used to cross the lesion. The lesion was predilated with a 2.5 mm balloon. The lesion was then stented with a 3.0 x 18 mm vision stent. The stent was postdilated with a 3.25 mm noncompliant balloon. Following PCI, there was 0% residual stenosis and TIMI-3 flow. At this point the patient's pain was improved but was Altic of moderate intensity. We then proceeded to intervene on the ostial ramus intermediate lesion. This was crossed with the prowater wire. This was dilated using a 3.0 x 10 mm cutting balloon performing 3 inflations to 6 atmospheres. This showed a good angiographic result with less than 30% residual stenosis and TIMI grade 3 flow. Final angiography confirmed an excellent result. The patient tolerated the procedure well. There were no immediate procedural complications. A TR band was used for radial hemostasis. The patient was transferred to the post catheterization recovery area for further monitoring.   PCI Data:   Vessel - LAD/Segment - mid vessel immediately after the takeoff of the first diagonal.   Percent Stenosis (pre) 95%   TIMI-flow 3.   Stent 3.0 x 18 mm vision stent.   Percent Stenosis (post) 0%   TIMI-flow (post) 3.   Second vessel: Ostial ramus intermediate Andreyah Natividad   Percent stenosis: 95%.   TIMI flow 3.   3.0 mm cutting balloon   Percent stenosis (post) less than 30%   TIMI flow 3   Final Conclusions:   1. Severe 2 vessel obstructive coronary disease.   2. Overall well preserved LV systolic function with apical wall motion abnormality and ejection fraction of 55%.   3. Successful intracoronary stenting of the mid LAD with a bare-metal stent.   4. Successful cutting balloon angioplasty of the ramus intermediate Zorianna Taliaferro.   Recommendations:   Continue therapy with Plavix. Patient reports a history of allergy to aspirin. Aggressive risk factor modification.   Disposition: Patient be observed in the intensive care unit tonight. If  she has no complications she should be able to be transferred to telemetry in the morning.     12/2013 Cath HEMODYNAMICS:    Central Aorta: 100/60    Left Ventricle: 100/3   ANGIOGRAPHY:   1. Left main: Normal and trifurcated into an LAD, ramus intermediate vessel, and a dominant left circumflex coronary artery   2. LAD: Barbera Setters gave rise to a proximal large first diagonal vessel.  The LAD just after the diagonal vessel had a widely patent stent.  The remainder of the LAD was free of significant disease and extended to the LV apex. 3. Ramus Intermediate: No evidence for restenosis at previous site of ostial cutting balloon intervention. 4. Left circumflex: Angiographically normal.  Dominant vessel, which gave rise to 2 marginal branches and in the PDA.Marland Kitchen   4. Right coronary artery: Angiographically normal, nondominant vessel   Left ventriculography revealed global LV function.  There was a suggestion of mild residual distal anterolateral hypocontractility.  There was catheter-induced mitral regurgitation.   IMPRESSION:   Normal  LV function with mild distal anterolateral, residual hypocontractility   No significant residual coronary obstructive disease with evidence for widely patent LAD stent, no evidence for restenosis of the ramus intermediate vessel, normal dominant left circumflex coronary artery and normal nondominant right coronary artery.       12/2013 MPI IMPRESSION: 1.  Abnormal Lexiscan for ischemia   2.  Large anteroseptal and apical area of ischemia   3. High risk study for major cardiac events based on area of myocardium at [facility]   4. Normal left ventricular systolic function, left ventricular ejection fraction 52%     12/2019 cath A drug-eluting stent was successfully placed using a STENT RESOLUTE ONYX 2.5X30. Postdilated to 2.7 mm Post intervention, there is a 0% residual stenosis throughout the entire segment.Marland Kitchen ------------ Suezanne Jacquet LAD lesion is 40% stenosed. Prox  LAD previously placed BMS stent is 30% stenosed. Prox LAD to Mid LAD lesion is 20% stenosed. Ost 1st Diag lesion is 99% stenosed. Known from prior catheterizations Small, nondominant RCA: Prox RCA to Dist RCA lesion is 50% stenosed. -------------- The left ventricular systolic function is normal. The left ventricular ejection fraction is 55-65% by visual estimate. LV end diastolic pressure is normal.   CULPRIT LESION: Ostial and proximal RAMUS INTERMEDIUS (RI) 95-70%  Successful DES PCI of RI (RESOLUTE ONYX DES 2.5 mm x 30 mm--2.7 mm) Otherwise moderate diffuse disease: Diffuse moderate LAD 20-30% stenosis and known 99% subtotal occlusion of the very small caliber 1st Diag Preserved LVEF and normal EDP.   12/2022 cath Ost LAD lesion is 40% stenosed.   Previously placed proximal LAD bare-metal stent has roughly 10% restenosis.  The jailed caliber first diagonal Ilia Dimaano has diffuse 99 % disease.   Prox LAD to Mid LAD lesion is 20% stenosed.   CULPRIT LESION SEGMENT: Distal LAD lesion is 99% stenosed followed by a long segment of 95% stenosis.  Not favorable for PCI.   Previously placed Ost Ramus to Ramus drug-eluting stent was is widely patent.   Small caliber, nondominant RCA: Prox RCA to Dist RCA lesion is 50% stenosed.   ------------------------------------------------   The left ventricular systolic function is normal.  The left ventricular ejection fraction is 55-65% by visual estimate.   LV end diastolic pressure is normal.   There is no aortic valve stenosis.   POST CATH DIAGNOSES Widely patent proximal LAD stent and ostial to optimal RI DES stent. Culprit lesion is distal LAD subtotal occlusion involving the apical portion of the wraparound LAD. Very long segment and the small caliber of also. Not favorable for PCI-recommend medical management. Otherwise normal LV function and LVEDP. No obvious wall motion normality.      RECOMMENDATIONS Continue aggressive medical of anginal  symptoms.  I have added Imdur 30 mg daily to Ranexa, bisoprolol and amlodipine. Will review with interventional colleagues, however I do not think that this would be a favorable lesion to tackle percutaneously based on the length of the lesion and the size the downstream vessel.  Previous images to indicate that is a relatively sizable vessel, but now it appears to be less than 2 mm.  The distance is probably at least 30 if not 40 mm.   Assessment and Plan    1. CAD with angina pectoris - recent cath as reported above, essentially distal small vessel LAD disease not amenable to pci - no recent symptoms. Room to titrate norvasc or bisoprolol if needed. Did not tolerate imdur in the past, she is on highest ranexa dose -  due to disease burden have elected for indefinite DAPT     2. Chronic diastolic HF - she is euvolemic today, continue current meds     3. HTN -bp is at goal, no med changes   4. Hyperlipidemia - LDL at goal, continue current therapy  F/u 6 months   Antoine Poche, M.D.

## 2023-04-07 NOTE — Telephone Encounter (Signed)
PA pending 24 hrs

## 2023-04-08 ENCOUNTER — Telehealth: Payer: Self-pay | Admitting: *Deleted

## 2023-04-08 NOTE — Telephone Encounter (Signed)
Tramadol has been approved 04/07/23-10/04/23

## 2023-04-11 NOTE — Telephone Encounter (Signed)
Pt spouse notified pa approved.

## 2023-04-19 ENCOUNTER — Other Ambulatory Visit: Payer: Self-pay | Admitting: Family Medicine

## 2023-04-19 ENCOUNTER — Other Ambulatory Visit: Payer: Self-pay | Admitting: Cardiology

## 2023-04-19 DIAGNOSIS — E1165 Type 2 diabetes mellitus with hyperglycemia: Secondary | ICD-10-CM

## 2023-04-22 ENCOUNTER — Ambulatory Visit (HOSPITAL_COMMUNITY): Payer: Medicaid Other

## 2023-05-02 ENCOUNTER — Encounter: Payer: Medicaid Other | Admitting: Physical Medicine and Rehabilitation

## 2023-05-09 ENCOUNTER — Other Ambulatory Visit: Payer: Self-pay

## 2023-05-09 DIAGNOSIS — N200 Calculus of kidney: Secondary | ICD-10-CM

## 2023-05-11 ENCOUNTER — Ambulatory Visit: Payer: Medicaid Other | Admitting: Urology

## 2023-05-16 ENCOUNTER — Ambulatory Visit (HOSPITAL_COMMUNITY)
Admission: RE | Admit: 2023-05-16 | Discharge: 2023-05-16 | Disposition: A | Discharge status: Home / Self Care | Payer: Medicaid Other | Source: Ambulatory Visit | Attending: Urology | Admitting: Urology

## 2023-05-16 DIAGNOSIS — N200 Calculus of kidney: Secondary | ICD-10-CM | POA: Insufficient documentation

## 2023-05-17 ENCOUNTER — Other Ambulatory Visit: Payer: Self-pay | Admitting: Family Medicine

## 2023-05-17 ENCOUNTER — Other Ambulatory Visit: Payer: Self-pay | Admitting: Cardiology
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
## 2023-05-23 ENCOUNTER — Encounter: Payer: Self-pay | Admitting: Internal Medicine

## 2023-05-23 ENCOUNTER — Ambulatory Visit: Payer: Medicaid Other | Admitting: Internal Medicine

## 2023-05-23 VITALS — BP 112/73 | HR 100 | Ht 70.0 in | Wt 367.0 lb

## 2023-05-23 DIAGNOSIS — F17210 Nicotine dependence, cigarettes, uncomplicated: Secondary | ICD-10-CM

## 2023-05-23 DIAGNOSIS — J4489 Other specified chronic obstructive pulmonary disease: Secondary | ICD-10-CM

## 2023-05-23 NOTE — Assessment & Plan Note (Signed)
Body mass index is 52.66 kg/m.  -  trending up again  Lab Results  Component Value Date   TSH 0.608 04/08/2020      Contributing to doe and risk of GERD/dvt/ pe  >>>   reviewed the need and the process to achieve and maintain neg calorie balance > defer f/u primary care including intermittently monitoring thyroid status            Each maintenance medication was reviewed in detail including emphasizing most importantly the difference between maintenance and prns and under what circumstances the prns are to be triggered using an action plan format where appropriate.  Total time for H and P, chart review, counseling, reviewing hfa  device(s) and generating customized AVS unique to this office visit / same day charting = 30 min

## 2023-05-23 NOTE — Patient Instructions (Addendum)
To get the most out of exercise, you need to be continuously aware that you are short of breath, but never out of breath, for at least 30 minutes daily. As you improve, it will actually be easier for you to do the same amount of exercise  in  30 minutes so always push to the level where you are short of breath.     Make sure you check your oxygen saturations at highest level of activity and call if losing ground   The key is to stop smoking completely before smoking completely stops you! Suggested e-cigs as an optional  "one way bridge"  Off all tobacco products       Please schedule a follow up visit in 6 months but call sooner if needed

## 2023-05-23 NOTE — Assessment & Plan Note (Signed)

## 2023-05-23 NOTE — Assessment & Plan Note (Signed)
Active smoker - PFT's  08/12/21 complete reversible airflow obst - d/c coreg 08/17/2021  - 11/22/2022  After extensive coaching inhaler device,  effectiveness =  80%     All goals of chronic asthma control met including optimal function and elimination of symptoms with minimal need for rescue therapy.  Contingencies discussed in full including contacting this office immediately if not controlling the symptoms using the rule of two's.

## 2023-06-06 DIAGNOSIS — N200 Calculus of kidney: Secondary | ICD-10-CM | POA: Insufficient documentation

## 2023-06-06 NOTE — Progress Notes (Deleted)
Name: [name] DOB: November 11, 1980 MRN: [id number]  History of Present Illness: Ms. [name] is a 42 y.o. female who presents today for follow up visit at [facility] [place]. - GU History: 1. Kidney stones (recurrent). - 04/13/2022: Underwent left ureteroscopic stone extraction. Stone composition was calcium oxalate.   At last visit with Dr. Ronne Binning on 04/21/2022: The plan was metabolic evaluation due to history of recurrent CaOx calculi. ***Litholink not found per chart review  Since last visit: [facility] on 05/16/2023: 1. No acute abnormality identified. 2. Left kidney scarring identified. The previously noted 7 mm stone in the left kidney is not as well appreciated on the current exam.  ***order Litholink  Today: She {Actions; denies-reports:120008} recent episode of stone pain / passage. She {Actions; denies-reports:120008} acute flank pain / abdominal pain. She {Actions; denies-reports:120008} fevers.  She {Actions; denies-reports:120008} nausea/ vomiting.  She {Actions; denies-reports:120008} increased urinary urgency, frequency, nocturia, dysuria, gross hematuria, hesitancy, straining to void, or sensations of incomplete emptying.   Fall Screening: Do you usually have a device to assist in your mobility? {yes/no:20286}  ***cane / ***walker / ***wheelchair  Medications: Current Outpatient Medications  Medication Sig Dispense Refill   Accu-Chek Softclix Lancets lancets CHECK BOOD SUGAR FOUR TIMES DAILY Dx E11.9 400 each 3   acetaminophen (TYLENOL) 500 MG tablet Take 1,000 mg by mouth every 6 (six) hours as needed for moderate pain.     albuterol (VENTOLIN HFA) 108 (90 Base) MCG/ACT inhaler Inhale 2 puffs into the lungs every 4 (four) hours as needed for wheezing or shortness of breath. 8 g 2   amLODipine (NORVASC) 5 MG tablet TAKE 1 TABLET BY MOUTH DAILY 30 tablet 6   aspirin 81 MG chewable tablet Chew 81 mg by mouth daily.     bisoprolol (ZEBETA) 5 MG tablet  TAKE 1 TABLET BY MOUTH TWICE DAILY 60 tablet 3   blood glucose meter kit and supplies Dispense based on patient and insurance preference. Use up to four times daily as directed. (FOR ICD-10:  E11.22). Check BG daily 1 each 0   Blood Glucose Monitoring Suppl (ACCU-CHEK GUIDE) w/Device KIT Test BS 4 times daily Dx E11.9 1 kit 0   budesonide-formoterol (SYMBICORT) 160-4.5 MCG/ACT inhaler INHALE TWO PUFFS first thing IN THE MORNING AND THEN INHALE another 2 PUFFS about 12 HOURS LATER (Patient taking differently: 1 puff 2 (two) times daily.) 10.2 g 11   busPIRone (BUSPAR) 10 MG tablet TAKE 1 TABLET BY MOUTH TWICE DAILY 60 tablet 2   clopidogrel (PLAVIX) 75 MG tablet TAKE 1 TABLET BY MOUTH EVERY DAY 90 tablet 2   DULoxetine (CYMBALTA) 60 MG capsule Take 1 capsule (60 mg total) by mouth at bedtime. 90 capsule 1   ezetimibe (ZETIA) 10 MG tablet TAKE 1 TABLET BY MOUTH EVERY DAY 90 tablet 2   fenofibrate (TRICOR) 48 MG tablet TAKE 1 TABLET BY MOUTH DAILY 30 tablet 2   FLUoxetine (PROZAC) 20 MG capsule TAKE ONE CAPSULE BY MOUTH EVERY DAY. GIVE WITH CYMBALTA DUE TO NERVE PAIN AND ANXIETY/DEPRESSION 90 capsule 1   glucose blood (ACCU-CHEK GUIDE) test strip CHECK BOOD SUGAR FOUR TIMES DAILY Dx E11.9 400 strip 3   losartan (COZAAR) 25 MG tablet TAKE 1 TABLET BY MOUTH DAILY 90 tablet 2   medroxyPROGESTERone (PROVERA) 5 MG tablet TAKE 1 TABLET BY MOUTH DAILY 90 tablet 0   metFORMIN (GLUCOPHAGE) 1000 MG tablet TAKE 1 TABLET BY MOUTH TWICE DAILY 180 tablet 2   nitroGLYCERIN (NITROSTAT) 0.4 MG  SL tablet DISSOLVE 1 TABLET UNDER THE TONGUE EVERY 5 MINUTES AS NEEDED FOR CHEST PAIN. DO NOT EXCEED A TOTAL OF 3 DOSES IN 15 MINUTES. 25 tablet 3   potassium chloride SA (KLOR-CON M) 20 MEQ tablet TAKE 1 TABLET BY MOUTH EVERY DAY AS NEEDED (when taking torsemide) 90 tablet 3   pregabalin (LYRICA) 150 MG capsule Take 1 capsule (150 mg total) by mouth 2 (two) times daily. 60 capsule 5   ranolazine (RANEXA) 1000 MG SR tablet TAKE  1 TABLET BY MOUTH TWICE DAILY 180 tablet 2   REPATHA SURECLICK 140 MG/ML SOAJ INJECT 140 MG INTO THE SKIN EVERY 14 DAYS 2 mL 6   rosuvastatin (CRESTOR) 40 MG tablet TAKE 1 TABLET BY MOUTH DAILY 30 tablet 2   Semaglutide, 1 MG/DOSE, (OZEMPIC, 1 MG/DOSE,) 4 MG/3ML SOPN INJECT 1 MG WEEKLY (TO REPLACE 0.5MG  OZEMPIC) 9 mL 0   torsemide (DEMADEX) 20 MG tablet TAKE 2 TABLETS BY MOUTH DAILY AS NEEDED FOR swelling 180 tablet 3   traMADol (ULTRAM) 50 MG tablet Take 2 tablets (100 mg total) by mouth 2 (two) times daily. 120 tablet 5   VASCEPA 1 g capsule TAKE TWO CAPSULES BY MOUTH TWICE DAILY 360 capsule 2   No current facility-administered medications for this visit.    Allergies: Allergies  Allergen Reactions   Bee Venom Anaphylaxis and Swelling   Latex Itching   Misc. Sulfonamide Containing Compounds Nausea And Vomiting   Sulfa Drugs Cross Reactors Nausea And Vomiting    Past Medical History:  Diagnosis Date   Allergy    Anemia    Anginal pain (HCC)    Anxiety    Arthritis    CHF (congestive heart failure) (HCC)    Chronic asthmatic bronchitis 08/17/2021   Chronic kidney disease    history of kidney stones   Coronary artery disease 09/2011   a.s/p BMS to LAD and angioplasty of RI in 09/2011 b. patent stent by cath in 02/2012, 12/2013, and 02/2016 with most recent showing 30-40% dLAD and 30-40% RCA stenosis   Depression    Diabetes mellitus    Dyspnea    GERD (gastroesophageal reflux disease)    History of kidney stones    Hyperlipidemia    Hypertension    Midsternal chest pain 03/03/2012   Migraines    Morbid obesity (HCC)    Myocardial infarct (HCC) 09/2011   Myocardial infarction (HCC) 02/2016   Neuromuscular disorder (HCC)    Palpitations 02/17/2012   Ureteral stone with hydronephrosis    Urinary tract obstruction due to kidney stone 02/15/2017   Past Surgical History:  Procedure Laterality Date   BIOPSY  10/27/2020   Procedure: BIOPSY;  Surgeon: Corbin Ade, MD;   Location: [facility];  Service: Endoscopy;;   CARDIAC CATHETERIZATION  10/08/2011   LAD: 95% mid, Ramus: 95% ostial   CARPAL TUNNEL RELEASE     CESAREAN SECTION     CHOLECYSTECTOMY     CORONARY ANGIOPLASTY WITH STENT PLACEMENT  10/08/2011   LAD: BMS, Ramus: cutting balloon angioplasty   CORONARY STENT INTERVENTION N/A 01/14/2020   Procedure: CORONARY STENT INTERVENTION;  Surgeon: Marykay Lex, MD;  Location: [facility] INVASIVE CV LAB;  Service: Cardiovascular;  Laterality: N/A;   CYSTOSCOPY WITH RETROGRADE PYELOGRAM, URETEROSCOPY AND STENT PLACEMENT Left 02/16/2017   Procedure: CYSTOSCOPY WITH RETROGRADE PYELOGRAM, URETEROSCOPY AND STENT PLACEMENT;  Surgeon: Ihor Gully, MD;  Location: [facility] OR[facility];  Service: Urology;  Laterality: Left;   CYSTOSCOPY WITH RETROGRADE PYELOGRAM, URETEROSCOPY  AND STENT PLACEMENT Left 03/28/2017   Procedure: LEFT STENT REMOVAL LEFT RETROGRADE PYELOGRAM LEFT URETEROSCOPY   LASER LITHOTRIPSY  AND  STENT REPLACEMENT;  Surgeon: Malen Gauze, MD;  Location: [facility];  Service: Urology;  Laterality: Left;   CYSTOSCOPY WITH RETROGRADE PYELOGRAM, URETEROSCOPY AND STENT PLACEMENT Left 04/13/2022   Procedure: CYSTOSCOPY WITH RETROGRADE PYELOGRAM, URETEROSCOPY AND STENT PLACEMENT, BASKET EXTRACTION;  Surgeon: Malen Gauze, MD;  Location: [facility];  Service: Urology;  Laterality: Left;   ESOPHAGOGASTRODUODENOSCOPY (EGD) WITH PROPOFOL N/A 10/27/2020   Rourk: mild erosive reflux esophagitis, h.pylori gastritis   LEFT HEART CATH AND CORONARY ANGIOGRAPHY N/A 08/15/2018   Procedure: LEFT HEART CATH AND CORONARY ANGIOGRAPHY;  Surgeon: Swaziland, Peter M, MD;  Location: [facility] INVASIVE CV LAB;  Service: Cardiovascular;  Laterality: N/A;   LEFT HEART CATH AND CORONARY ANGIOGRAPHY N/A 01/14/2020   Procedure: LEFT HEART CATH AND CORONARY ANGIOGRAPHY;  Surgeon: Marykay Lex, MD;  Location: [facility] INVASIVE CV LAB;  Service: Cardiovascular;  Laterality: N/A;   LEFT HEART CATH AND CORONARY  ANGIOGRAPHY N/A 01/18/2023   Procedure: LEFT HEART CATH AND CORONARY ANGIOGRAPHY;  Surgeon: Marykay Lex, MD;  Location: [facility] INVASIVE CV LAB;  Service: Cardiovascular;  Laterality: N/A;   LEFT HEART CATHETERIZATION WITH CORONARY ANGIOGRAM N/A 10/08/2011   Procedure: LEFT HEART CATHETERIZATION WITH CORONARY ANGIOGRAM;  Surgeon: Peter M Swaziland, MD;  Location: [facility] CATH LAB;  Service: Cardiovascular;  Laterality: N/A;   LEFT HEART CATHETERIZATION WITH CORONARY ANGIOGRAM N/A 03/02/2012   Procedure: LEFT HEART CATHETERIZATION WITH CORONARY ANGIOGRAM;  Surgeon: Kathleene Hazel, MD;  Location: [facility] CATH LAB;  Service: Cardiovascular;  Laterality: N/A;   LEFT HEART CATHETERIZATION WITH CORONARY ANGIOGRAM N/A 01/04/2014   Procedure: LEFT HEART CATHETERIZATION WITH CORONARY ANGIOGRAM;  Surgeon: Lennette Bihari, MD;  Location: [facility] CATH LAB;  Service: Cardiovascular;  Laterality: N/A;   right hand pinky surgery     Family History  Problem Relation Age of Onset   Heart murmur Father    Diabetes Father    Hypertension Father    Hyperlipidemia Father    Cancer Father        throat   Diabetes Mother    Cancer Mother        breast.uterine, ovarian   Asthma Son    Appendicitis Son    Hernia Son    Mental illness Son        Bipolar, personality d/o   Stroke Maternal Grandmother    Cancer Maternal Grandmother        breast   Cancer Paternal Grandfather        lung   Colon cancer Neg Hx    Social History   Socioeconomic History   Marital status: Married    Spouse name: Ree Kida   Number of children: 2   Years of education: 10   Highest education level: Not on file  Occupational History   Not on file  Tobacco Use   Smoking status: Some Days    Current packs/day: 0.50    Average packs/day: 0.5 packs/day for 33.2 years (16.6 ttl pk-yrs)    Types: Cigarettes    Start date: 03/18/1990   Smokeless tobacco: Never   Tobacco comments:    Vapes currently   Vaping Use   Vaping status: Some Days    Substances: Nicotine  Substance and Sexual Activity   Alcohol use: Not Currently    Alcohol/week: 0.0 standard drinks of alcohol    Comment: maybe once a year  Drug use: No   Sexual activity: Yes    Birth control/protection: None    Comment: PCOS  Other Topics Concern   Not on file  Social History Narrative   Unemployed   Applied for disability   Leisure "take care of house and kids"   Walks when able      She is working to get on disability. She previously worked on Production designer, theatre/television/film.      Social Determinants of Health   Financial Resource Strain: Low Risk  (10/28/2017)   Overall Financial Resource Strain (CARDIA)    Difficulty of Paying Living Expenses: Not hard at all  Food Insecurity: No Food Insecurity (11/12/2022)   Hunger Vital Sign    Worried About Running Out of Food in the Last Year: Never true    Ran Out of Food in the Last Year: Never true  Transportation Needs: No Transportation Needs (11/12/2022)   PRAPARE - Administrator, Civil Service (Medical): No    Lack of Transportation (Non-Medical): No  Physical Activity: Inactive (10/28/2017)   Exercise Vital Sign    Days of Exercise per Week: 0 days    Minutes of Exercise per Session: 0 min  Stress: Stress Concern Present (10/28/2017)   [employer] of Occupational Health - Occupational Stress Questionnaire    Feeling of Stress : To some extent  Social Connections: Somewhat Isolated (10/28/2017)   Social Connection and Isolation Panel [NHANES]    Frequency of Communication with Friends and Family: More than three times a week    Frequency of Social Gatherings with Friends and Family: More than three times a week    Attends Religious Services: Never    Database administrator or Organizations: No    Attends Banker Meetings: Never    Marital Status: Married  Catering manager Violence: Not At Risk (11/12/2022)   Humiliation, Afraid, Rape, and Kick questionnaire    Fear of Current or  Ex-Partner: No    Emotionally Abused: No    Physically Abused: No    Sexually Abused: No    SUBJECTIVE  Review of Systems Constitutional: Patient ***denies any unintentional weight loss or change in strength lntegumentary: Patient ***denies any rashes or pruritus Eyes: Patient denies ***dry eyes ENT: Patient ***denies dry mouth Cardiovascular: Patient ***denies chest pain or syncope Respiratory: Patient ***denies shortness of breath Gastrointestinal: Patient ***denies nausea, vomiting, constipation, or diarrhea Musculoskeletal: Patient ***denies muscle cramps or weakness Neurologic: Patient ***denies convulsions or seizures Psychiatric: Patient ***denies memory problems Allergic/Immunologic: Patient ***denies recent allergic reaction(s) Hematologic/Lymphatic: Patient denies bleeding tendencies Endocrine: Patient ***denies heat/cold intolerance  GU: As per HPI.  OBJECTIVE There were no vitals filed for this visit. There is no height or weight on file to calculate BMI.  Physical Examination Constitutional: ***No obvious distress; patient is ***non-toxic appearing  Cardiovascular: ***No visible lower extremity edema.  Respiratory: The patient does ***not have audible wheezing/stridor; respirations do ***not appear labored  Gastrointestinal: Abdomen ***non-distended Musculoskeletal: ***Normal ROM of UEs  Skin: ***No obvious rashes/open sores  Neurologic: CN 2-12 grossly ***intact Psychiatric: Answered questions ***appropriately with ***normal affect  Hematologic/Lymphatic/Immunologic: ***No obvious bruises or sites of spontaneous bleeding  UA: ***negative / *** WBC/hpf, *** RBC/hpf, bacteria (***) PVR: *** ml  ASSESSMENT No diagnosis found.  ***We reviewed recent imaging results; ***awaiting radiology results, appears to have ***no acute findings.  ***For stone prevention: Advised adequate hydration and we discussed option to consider low oxalate diet given that calcium  oxalate is  the most common type of stone. Handout provided about stone prevention diet.  ***For recurrent stone formers: We discussed option to proceed with 24 hour urinalysis (Litholink) for metabolic evaluation, which may help with targeted recommendations for dietary I medication therapies for stone prevention. Patient elected to ***proceed/ ***hold off.  Will plan to follow up in ***6 months / ***1 year with ***KUB ***[facility] for stone surveillance or sooner if needed.  Pt verbalized understanding and agreement. All questions were answered.  PLAN Advised the following: Maintain adequate fluid intake daily. Drink citrus juice (lemon, lime or orange juice) routinely. Low oxalate diet. No follow-ups on file.  No orders of the defined types were placed in this encounter.   It has been explained that the patient is to follow regularly with their PCP in addition to all other providers involved in their care and to follow instructions provided by these respective offices. Patient advised to contact urology clinic if any urologic-pertaining questions, concerns, new symptoms or problems arise in the interim period.  There are no Patient Instructions on file for this visit.  Electronically signed by:  Donnita Falls, MSN, FNP-C, CUNP 06/06/2023 10:52 AM

## 2023-06-07 ENCOUNTER — Ambulatory Visit: Payer: Medicaid Other | Admitting: Urology

## 2023-06-07 DIAGNOSIS — N200 Calculus of kidney: Secondary | ICD-10-CM

## 2023-06-15 ENCOUNTER — Other Ambulatory Visit: Payer: Self-pay | Admitting: Physical Medicine & Rehabilitation

## 2023-06-15 ENCOUNTER — Other Ambulatory Visit: Payer: Self-pay | Admitting: Family Medicine

## 2023-06-15 ENCOUNTER — Other Ambulatory Visit: Payer: Self-pay | Admitting: Cardiology

## 2023-06-15 DIAGNOSIS — E1169 Type 2 diabetes mellitus with other specified complication: Secondary | ICD-10-CM

## 2023-06-15 DIAGNOSIS — F411 Generalized anxiety disorder: Secondary | ICD-10-CM

## 2023-06-15 DIAGNOSIS — F418 Other specified anxiety disorders: Secondary | ICD-10-CM

## 2023-06-21 ENCOUNTER — Other Ambulatory Visit: Payer: Self-pay | Admitting: Physical Medicine and Rehabilitation

## 2023-06-21 DIAGNOSIS — E1140 Type 2 diabetes mellitus with diabetic neuropathy, unspecified: Secondary | ICD-10-CM

## 2023-06-21 DIAGNOSIS — G8929 Other chronic pain: Secondary | ICD-10-CM

## 2023-06-24 ENCOUNTER — Inpatient Hospital Stay: Payer: Medicaid Other | Attending: Hematology

## 2023-06-24 DIAGNOSIS — D72829 Elevated white blood cell count, unspecified: Secondary | ICD-10-CM | POA: Insufficient documentation

## 2023-06-24 LAB — CBC WITH DIFFERENTIAL/PLATELET
Abs Immature Granulocytes: 0.05 10*3/uL (ref 0.00–0.07)
Basophils Absolute: 0 10*3/uL (ref 0.0–0.1)
Basophils Relative: 0 %
Eosinophils Absolute: 0.1 10*3/uL (ref 0.0–0.5)
Eosinophils Relative: 1 %
HCT: 39 % (ref 36.0–46.0)
Hemoglobin: 13.2 g/dL (ref 12.0–15.0)
Immature Granulocytes: 0 %
Lymphocytes Relative: 27 %
Lymphs Abs: 3.1 10*3/uL (ref 0.7–4.0)
MCH: 31.3 pg (ref 26.0–34.0)
MCHC: 33.8 g/dL (ref 30.0–36.0)
MCV: 92.4 fL (ref 80.0–100.0)
Monocytes Absolute: 0.7 10*3/uL (ref 0.1–1.0)
Monocytes Relative: 6 %
Neutro Abs: 7.4 10*3/uL (ref 1.7–7.7)
Neutrophils Relative %: 66 %
Platelets: 312 10*3/uL (ref 150–400)
RBC: 4.22 MIL/uL (ref 3.87–5.11)
RDW: 12.6 % (ref 11.5–15.5)
WBC: 11.4 10*3/uL — ABNORMAL HIGH (ref 4.0–10.5)
nRBC: 0 % (ref 0.0–0.2)

## 2023-06-28 ENCOUNTER — Ambulatory Visit: Payer: Medicaid Other | Admitting: Cardiology

## 2023-07-01 ENCOUNTER — Encounter
Payer: Medicaid Other | Attending: Physical Medicine and Rehabilitation | Admitting: Physical Medicine and Rehabilitation

## 2023-07-01 ENCOUNTER — Encounter: Payer: Self-pay | Admitting: Physical Medicine and Rehabilitation

## 2023-07-01 VITALS — BP 136/88 | HR 84 | Ht 70.0 in | Wt 271.0 lb

## 2023-07-01 DIAGNOSIS — E1140 Type 2 diabetes mellitus with diabetic neuropathy, unspecified: Secondary | ICD-10-CM | POA: Diagnosis not present

## 2023-07-01 DIAGNOSIS — M545 Low back pain, unspecified: Secondary | ICD-10-CM | POA: Insufficient documentation

## 2023-07-01 DIAGNOSIS — M797 Fibromyalgia: Secondary | ICD-10-CM | POA: Insufficient documentation

## 2023-07-01 DIAGNOSIS — G8929 Other chronic pain: Secondary | ICD-10-CM | POA: Diagnosis not present

## 2023-07-01 DIAGNOSIS — M7918 Myalgia, other site: Secondary | ICD-10-CM | POA: Insufficient documentation

## 2023-07-01 MED ORDER — LIDOCAINE HCL 1 % IJ SOLN
9.0000 mL | Freq: Once | INTRAMUSCULAR | Status: AC
Start: 2023-07-01 — End: 2023-07-01
  Administered 2023-07-01: 9 mL

## 2023-07-01 MED ORDER — DULOXETINE HCL 60 MG PO CPEP: 60.0000 mg | ORAL_CAPSULE | Freq: Every day | ORAL | 1 refills | Status: DC

## 2023-07-01 MED ORDER — PREDNISONE 20 MG PO TABS: 20.0000 mg | ORAL_TABLET | Freq: Every day | ORAL | 0 refills | Status: DC

## 2023-07-01 MED ORDER — PREGABALIN 150 MG PO CAPS
150.0000 mg | ORAL_CAPSULE | Freq: Three times a day (TID) | ORAL | 5 refills | Status: DC
Start: 2023-07-01 — End: 2023-12-26
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
## 2023-07-01 NOTE — Patient Instructions (Signed)
Plan: Needs refill of Duloxetine 60 mg daily-   2. Will try Prednisone 20 mg daily- x 4 days- to get over flare of pain - and see if can reset- let me know if Blood sugars go too high.   3. Patient here for trigger point injections for fibromyalgia  Consent done and on chart.  Cleaned areas with alcohol and injected using a 27 gauge 1.5 inch needle  Injected  9 cc-none wasted Using 1% Lidocaine with no EPI  Upper traps B/L  Levators- B/L Posterior scalenes Middle scalenes- b/l Splenius Capitus- B/l  Pectoralis Major- B/L  Rhomboids- B/L x3 Infraspinatus Teres Major/minor- B/L  Thoracic paraspinals- B/L x3 Lumbar paraspinals- B/L x2 Other injections-  B/L hands   Patient's level of pain prior was 9-10/10 Current level of pain after injections is 8/10- can turn head, so improvement  There was no bleeding or complications.  Patient was advised to drink a lot of water on day after injections to flush system Will have increased soreness for 12-48 hours after injections.  Can use Lidocaine patches the day AFTER injections Can use theracane on day of injections in places didn't inject Can use heating pad 4-6 hours AFTER injections  4. Will increase Pregabalin/Lyrica to 150 mg 3x/day instead of 2x/day- for nerve pain.    5. F/U in 3 months- trigger point injections-  Call me in 2-4 weeks or earlier if need be- mychart is fine.

## 2023-07-14 ENCOUNTER — Other Ambulatory Visit: Payer: Self-pay | Admitting: Family Medicine

## 2023-07-14 DIAGNOSIS — E7800 Pure hypercholesterolemia, unspecified: Secondary | ICD-10-CM

## 2023-07-14 DIAGNOSIS — I214 Non-ST elevation (NSTEMI) myocardial infarction: Secondary | ICD-10-CM

## 2023-07-25 ENCOUNTER — Ambulatory Visit: Payer: Medicaid Other | Admitting: Family Medicine

## 2023-07-25 ENCOUNTER — Encounter: Payer: Self-pay | Admitting: Family Medicine

## 2023-07-25 VITALS — BP 114/73 | HR 78 | Temp 98.3°F | Ht 70.0 in | Wt 270.0 lb

## 2023-07-25 DIAGNOSIS — E1140 Type 2 diabetes mellitus with diabetic neuropathy, unspecified: Secondary | ICD-10-CM | POA: Diagnosis not present

## 2023-07-25 DIAGNOSIS — E119 Type 2 diabetes mellitus without complications: Secondary | ICD-10-CM

## 2023-07-25 DIAGNOSIS — Z23 Encounter for immunization: Secondary | ICD-10-CM | POA: Diagnosis not present

## 2023-07-25 DIAGNOSIS — L821 Other seborrheic keratosis: Secondary | ICD-10-CM | POA: Diagnosis not present

## 2023-07-25 DIAGNOSIS — Z7985 Long-term (current) use of injectable non-insulin antidiabetic drugs: Secondary | ICD-10-CM

## 2023-07-25 DIAGNOSIS — L918 Other hypertrophic disorders of the skin: Secondary | ICD-10-CM

## 2023-07-25 DIAGNOSIS — I152 Hypertension secondary to endocrine disorders: Secondary | ICD-10-CM | POA: Diagnosis not present

## 2023-07-25 DIAGNOSIS — L819 Disorder of pigmentation, unspecified: Secondary | ICD-10-CM

## 2023-07-25 DIAGNOSIS — E785 Hyperlipidemia, unspecified: Secondary | ICD-10-CM | POA: Diagnosis not present

## 2023-07-25 DIAGNOSIS — E1169 Type 2 diabetes mellitus with other specified complication: Secondary | ICD-10-CM | POA: Diagnosis not present

## 2023-07-25 DIAGNOSIS — E1159 Type 2 diabetes mellitus with other circulatory complications: Secondary | ICD-10-CM

## 2023-07-25 LAB — BAYER DCA HB A1C WAIVED: HB A1C (BAYER DCA - WAIVED): 5.5 % (ref 4.8–5.6)

## 2023-07-25 NOTE — Progress Notes (Signed)
Subjective: CC:DM PCP: Raliegh Ip, DO AVW:[id number] [name] is a 42 y.o. female presenting to clinic today for:  1. Type 2 Diabetes with hypertension, hyperlipidemia:  High >400. No lows.  Not following a diet.  Father in law died in 2023-04-19.  She's been working through his belongings.  She's been stressed.  Compliant with meds  Diabetes Health Maintenance Due  Topic Date Due   HEMOGLOBIN A1C  08/21/2023   OPHTHALMOLOGY EXAM  09/29/2023   FOOT EXAM  10/21/2023    Last A1c:  Lab Results  Component Value Date   HGBA1C 6.5 (H) 02/18/2023    ROS: no CP, SOB, falls.  2.  Skin lesions Patient's main concern is a mole on the mid back.  She would like to see a dermatologist for this.  She also has some irritated skin tags that she would like to have removed on her flank.  Her last dermatology was back in [place] so she needs to establish 1 here in [facility]  ROS: Per HPI  Allergies  Allergen Reactions   Bee Venom Anaphylaxis and Swelling   Latex Itching   Misc. Sulfonamide Containing Compounds Nausea And Vomiting   Sulfa Drugs Cross Reactors Nausea And Vomiting   Past Medical History:  Diagnosis Date   Allergy    Anemia    Anginal pain (HCC)    Anxiety    Arthritis    CHF (congestive heart failure) (HCC)    Chronic asthmatic bronchitis (HCC) 08/17/2021   Chronic kidney disease    history of kidney stones   Coronary artery disease 09/2011   a.s/p BMS to LAD and angioplasty of RI in 09/2011 b. patent stent by cath in 02/2012, 12/2013, and 02/2016 with most recent showing 30-40% dLAD and 30-40% RCA stenosis   Depression    Diabetes mellitus    Dyspnea    GERD (gastroesophageal reflux disease)    History of kidney stones    Hyperlipidemia    Hypertension    Midsternal chest pain 03/03/2012   Migraines    Morbid obesity (HCC)    Myocardial infarct (HCC) 09/2011   Myocardial infarction (HCC) 02/2016   Neuromuscular disorder (HCC)    Palpitations  02/17/2012   Ureteral stone with hydronephrosis    Urinary tract obstruction due to kidney stone 02/15/2017    Current Outpatient Medications:    Accu-Chek Softclix Lancets lancets, CHECK BOOD SUGAR FOUR TIMES DAILY Dx E11.9, Disp: 400 each, Rfl: 3   acetaminophen (TYLENOL) 500 MG tablet, Take 1,000 mg by mouth every 6 (six) hours as needed for moderate pain., Disp: , Rfl:    albuterol (VENTOLIN HFA) 108 (90 Base) MCG/ACT inhaler, Inhale 2 puffs into the lungs every 4 (four) hours as needed for wheezing or shortness of breath., Disp: 8 g, Rfl: 2   amLODipine (NORVASC) 5 MG tablet, TAKE 1 TABLET BY MOUTH DAILY, Disp: 30 tablet, Rfl: 6   aspirin 81 MG chewable tablet, Chew 81 mg by mouth daily., Disp: , Rfl:    bisoprolol (ZEBETA) 5 MG tablet, TAKE 1 TABLET BY MOUTH TWICE DAILY, Disp: 60 tablet, Rfl: 3   blood glucose meter kit and supplies, Dispense based on patient and insurance preference. Use up to four times daily as directed. (FOR ICD-10:  E11.22). Check BG daily, Disp: 1 each, Rfl: 0   Blood Glucose Monitoring Suppl (ACCU-CHEK GUIDE) w/Device KIT, Test BS 4 times daily Dx E11.9, Disp: 1 kit, Rfl: 0   budesonide-formoterol (SYMBICORT) 160-4.5 MCG/ACT  inhaler, INHALE TWO PUFFS first thing IN THE MORNING AND THEN INHALE another 2 PUFFS about 12 HOURS LATER (Patient taking differently: 1 puff 2 (two) times daily.), Disp: 10.2 g, Rfl: 11   busPIRone (BUSPAR) 10 MG tablet, TAKE 1 TABLET BY MOUTH TWICE DAILY, Disp: 60 tablet, Rfl: 1   clopidogrel (PLAVIX) 75 MG tablet, TAKE 1 TABLET BY MOUTH EVERY DAY, Disp: 90 tablet, Rfl: 2   DULoxetine (CYMBALTA) 60 MG capsule, Take 1 capsule (60 mg total) by mouth at bedtime., Disp: 90 capsule, Rfl: 1   Evolocumab (REPATHA SURECLICK) 140 MG/ML SOAJ, INJECT 140 MG UNDER THE SKIN EVERY 14 DAYS, Disp: 2 mL, Rfl: 0   ezetimibe (ZETIA) 10 MG tablet, TAKE 1 TABLET BY MOUTH EVERY DAY, Disp: 90 tablet, Rfl: 2   fenofibrate (TRICOR) 48 MG tablet, TAKE 1 TABLET BY MOUTH  DAILY, Disp: 30 tablet, Rfl: 1   FLUoxetine (PROZAC) 20 MG capsule, TAKE ONE CAPSULE BY MOUTH EVERY DAY GIVE WITH CYMBALTA DUE TO NERVE PAIN AND ANXIETY/DEPRESSION, Disp: 90 capsule, Rfl: 1   glucose blood (ACCU-CHEK GUIDE) test strip, CHECK BOOD SUGAR FOUR TIMES DAILY Dx E11.9, Disp: 400 strip, Rfl: 3   losartan (COZAAR) 25 MG tablet, TAKE 1 TABLET BY MOUTH DAILY, Disp: 90 tablet, Rfl: 2   medroxyPROGESTERone (PROVERA) 5 MG tablet, TAKE 1 TABLET BY MOUTH DAILY, Disp: 90 tablet, Rfl: 0   metFORMIN (GLUCOPHAGE) 1000 MG tablet, TAKE 1 TABLET BY MOUTH TWICE DAILY, Disp: 180 tablet, Rfl: 2   nitroGLYCERIN (NITROSTAT) 0.4 MG SL tablet, DISSOLVE 1 TABLET UNDER THE TONGUE EVERY 5 MINUTES AS NEEDED FOR CHEST PAIN. DO NOT EXCEED A TOTAL OF 3 DOSES IN 15 MINUTES., Disp: 25 tablet, Rfl: 3   potassium chloride SA (KLOR-CON M) 20 MEQ tablet, TAKE 1 TABLET BY MOUTH EVERY DAY AS NEEDED (when taking torsemide), Disp: 90 tablet, Rfl: 3   pregabalin (LYRICA) 150 MG capsule, Take 1 capsule (150 mg total) by mouth 3 (three) times daily., Disp: 90 capsule, Rfl: 5   ranolazine (RANEXA) 1000 MG SR tablet, TAKE 1 TABLET BY MOUTH TWICE DAILY, Disp: 180 tablet, Rfl: 2   rosuvastatin (CRESTOR) 40 MG tablet, TAKE 1 TABLET BY MOUTH DAILY, Disp: 30 tablet, Rfl: 1   Semaglutide, 1 MG/DOSE, (OZEMPIC, 1 MG/DOSE,) 4 MG/3ML SOPN, INJECT 1 MG WEEKLY (TO REPLACE 0.5MG  OZEMPIC), Disp: 9 mL, Rfl: 0   torsemide (DEMADEX) 20 MG tablet, TAKE 2 TABLETS BY MOUTH DAILY AS NEEDED FOR swelling, Disp: 180 tablet, Rfl: 3   traMADol (ULTRAM) 50 MG tablet, TAKE 2 TABLETS BY MOUTH TWICE DAILY, Disp: 120 tablet, Rfl: 2   VASCEPA 1 g capsule, TAKE TWO CAPSULES BY MOUTH TWICE DAILY, Disp: 360 capsule, Rfl: 2 Social History   Socioeconomic History   Marital status: Married    Spouse name: Ree Kida   Number of children: 2   Years of education: 10   Highest education level: Not on file  Occupational History   Not on file  Tobacco Use   Smoking status:  Some Days    Current packs/day: 0.50    Average packs/day: 0.5 packs/day for 33.4 years (16.7 ttl pk-yrs)    Types: Cigarettes    Start date: 03/18/1990   Smokeless tobacco: Never   Tobacco comments:    Vapes currently   Vaping Use   Vaping status: Some Days   Substances: Nicotine  Substance and Sexual Activity   Alcohol use: Not Currently    Alcohol/week: 0.0 standard drinks of alcohol  Comment: maybe once a year   Drug use: No   Sexual activity: Yes    Birth control/protection: None    Comment: PCOS  Other Topics Concern   Not on file  Social History Narrative   Unemployed   Applied for disability   Leisure "take care of house and kids"   Walks when able      She is working to get on disability. She previously worked on Production designer, theatre/television/film.      Social Determinants of Health   Financial Resource Strain: Low Risk  (10/28/2017)   Overall Financial Resource Strain (CARDIA)    Difficulty of Paying Living Expenses: Not hard at all  Food Insecurity: No Food Insecurity (11/12/2022)   Hunger Vital Sign    Worried About Running Out of Food in the Last Year: Never true    Ran Out of Food in the Last Year: Never true  Transportation Needs: No Transportation Needs (11/12/2022)   PRAPARE - Administrator, Civil Service (Medical): No    Lack of Transportation (Non-Medical): No  Physical Activity: Inactive (10/28/2017)   Exercise Vital Sign    Days of Exercise per Week: 0 days    Minutes of Exercise per Session: 0 min  Stress: Stress Concern Present (10/28/2017)   [employer] of Occupational Health - Occupational Stress Questionnaire    Feeling of Stress : To some extent  Social Connections: Somewhat Isolated (10/28/2017)   Social Connection and Isolation Panel [NHANES]    Frequency of Communication with Friends and Family: More than three times a week    Frequency of Social Gatherings with Friends and Family: More than three times a week    Attends Religious Services:  Never    Database administrator or Organizations: No    Attends Banker Meetings: Never    Marital Status: Married  Catering manager Violence: Not At Risk (11/12/2022)   Humiliation, Afraid, Rape, and Kick questionnaire    Fear of Current or Ex-Partner: No    Emotionally Abused: No    Physically Abused: No    Sexually Abused: No   Family History  Problem Relation Age of Onset   Heart murmur Father    Diabetes Father    Hypertension Father    Hyperlipidemia Father    Cancer Father        throat   Diabetes Mother    Cancer Mother        breast.uterine, ovarian   Asthma Son    Appendicitis Son    Hernia Son    Mental illness Son        Bipolar, personality Katelyn/o   Stroke Maternal Grandmother    Cancer Maternal Grandmother        breast   Cancer Paternal Grandfather        lung   Colon cancer Neg Hx     Objective: Office vital signs reviewed. BP 114/73   Pulse 78   Temp 98.3 F (36.8 C)   Ht 5\' 10"  (1.778 m)   Wt 270 lb (122.5 kg)   SpO2 97%   BMI 38.74 kg/m   Physical Examination:  General: Awake, alert, well nourished, morbidly obese, No acute distress HEENT: sclera white, MMM Cardio: regular rate and rhythm, S1S2 heard, no murmurs appreciated Pulm: clear to auscultation bilaterally, no wheezes, rhonchi or rales; normal work of breathing on room air MSK: Ambulating independently with normal gait and station Skin: Large, asymmetrically pigmented seborrheic keratosis noted  along the mid back.  She has a skin tag along the left flank and along the right anterior lateral aspect of the abdomen.  Multiple pigmented skin lesions throughout the trunk and upper extremities  Assessment/ Plan: 42 y.o. female   Diabetes mellitus treated with injections of non-insulin medication (HCC) - Plan: CMP14+EGFR, Bayer DCA Hb A1c Waived  Hypertension associated with diabetes (HCC) - Plan: CMP14+EGFR  Hyperlipidemia associated with type 2 diabetes mellitus (HCC) - Plan:  CMP14+EGFR  Diabetic neuropathy, painful (HCC) - Plan: CMP14+EGFR  Seborrheic keratoses - Plan: Ambulatory referral to Dermatology  Inflamed skin tag - Plan: Ambulatory referral to Dermatology  Pigmented skin lesion of uncertain behavior of torso - Plan: Ambulatory referral to Dermatology   Sugar remains under excellent control.  No medication and changes needed.  Check renal function, liver enzymes  Blood pressure controlled  Continue cholesterol medications as prescribed.  Not yet due for fasting lipid.  Check liver enzymes  Continue to follow-up with pain medicine as directed.  Diabetic neuropathy is chronic and stable  Lesion of concern in the mid back appears consistent with seborrheic keratosis but she has several pigmented lesions that I think warrant full body exam.  I referred her to dermatology for this as well as to address the inflamed skin tags on her flank  Raliegh Ip, DO [facility] [phone]

## 2023-07-26 LAB — CMP14+EGFR
ALT: 16 [IU]/L (ref 0–32)
AST: 18 [IU]/L (ref 0–40)
Albumin: 4 g/dL (ref 3.9–4.9)
Alkaline Phosphatase: 63 [IU]/L (ref 44–121)
BUN/Creatinine Ratio: 16 (ref 9–23)
BUN: 12 mg/dL (ref 6–24)
Bilirubin Total: <0.2 mg/dL (ref 0.0–1.2)
CO2: 21 mmol/L (ref 20–29)
Calcium: 9 mg/dL (ref 8.7–10.2)
Chloride: 101 mmol/L (ref 96–106)
Creatinine, Ser: 0.73 mg/dL (ref 0.57–1.00)
Globulin, Total: 2.8 g/dL (ref 1.5–4.5)
Glucose: 104 mg/dL — ABNORMAL HIGH (ref 70–99)
Potassium: 4.2 mmol/L (ref 3.5–5.2)
Sodium: 137 mmol/L (ref 134–144)
Total Protein: 6.8 g/dL (ref 6.0–8.5)
eGFR: 104 mL/min/{1.73_m2} (ref >59)

## 2023-08-02 ENCOUNTER — Other Ambulatory Visit (HOSPITAL_COMMUNITY): Payer: Self-pay

## 2023-08-13 ENCOUNTER — Other Ambulatory Visit: Payer: Self-pay | Admitting: Family Medicine

## 2023-08-13 DIAGNOSIS — E7800 Pure hypercholesterolemia, unspecified: Secondary | ICD-10-CM

## 2023-08-13 DIAGNOSIS — I214 Non-ST elevation (NSTEMI) myocardial infarction: Secondary | ICD-10-CM

## 2023-08-15 ENCOUNTER — Other Ambulatory Visit: Payer: Self-pay | Admitting: Family Medicine

## 2023-08-15 DIAGNOSIS — F411 Generalized anxiety disorder: Secondary | ICD-10-CM

## 2023-08-15 DIAGNOSIS — F418 Other specified anxiety disorders: Secondary | ICD-10-CM

## 2023-08-15 DIAGNOSIS — E785 Hyperlipidemia, unspecified: Secondary | ICD-10-CM

## 2023-08-26 ENCOUNTER — Other Ambulatory Visit: Payer: Self-pay | Admitting: Family Medicine

## 2023-08-30 ENCOUNTER — Telehealth: Payer: Self-pay

## 2023-08-30 NOTE — Telephone Encounter (Signed)
[name] (Key: [id number]) PA Case ID #: [id number] Need Help? Call us at [phone] Status sent iconSent to Plan today Drug Repatha SureClick 140MG /ML auto-injectors ePA cloud logo Form [facility] [place] [employer] [place] Form [phone] NCPDP)

## 2023-08-30 NOTE — Telephone Encounter (Signed)
Pharmacy Patient Advocate Encounter  Received notification from [facility] that Prior Authorization for Repatha SureClick 140MG /ML auto-injectors has been APPROVED from 08/30/23 to 08/29/24   PA #/Case ID/Reference #: [id number]

## 2023-08-31 ENCOUNTER — Encounter: Payer: Self-pay | Admitting: Physical Medicine and Rehabilitation

## 2023-08-31 ENCOUNTER — Encounter
Payer: Medicaid Other | Attending: Physical Medicine and Rehabilitation | Admitting: Physical Medicine and Rehabilitation

## 2023-08-31 VITALS — BP 116/74 | HR 83 | Ht 70.0 in | Wt 369.0 lb

## 2023-08-31 DIAGNOSIS — M5442 Lumbago with sciatica, left side: Secondary | ICD-10-CM | POA: Diagnosis not present

## 2023-08-31 DIAGNOSIS — M797 Fibromyalgia: Secondary | ICD-10-CM | POA: Diagnosis not present

## 2023-08-31 DIAGNOSIS — M7918 Myalgia, other site: Secondary | ICD-10-CM | POA: Diagnosis not present

## 2023-08-31 DIAGNOSIS — M5441 Lumbago with sciatica, right side: Secondary | ICD-10-CM | POA: Diagnosis not present

## 2023-08-31 DIAGNOSIS — Z6841 Body Mass Index (BMI) 40.0 and over, adult: Secondary | ICD-10-CM

## 2023-08-31 DIAGNOSIS — G8929 Other chronic pain: Secondary | ICD-10-CM | POA: Diagnosis not present

## 2023-08-31 MED ORDER — TRAMADOL HCL 50 MG PO TABS: 100.0000 mg | ORAL_TABLET | Freq: Two times a day (BID) | ORAL | 5 refills | Status: DC

## 2023-08-31 MED ORDER — LIDOCAINE HCL 1 % IJ SOLN
9.0000 mL | Freq: Once | INTRAMUSCULAR | Status: AC
  Administered 2023-08-31: 9 mL

## 2023-08-31 NOTE — Patient Instructions (Signed)
Plan: Will refill Tramadol 100 mg 2x/day- #120-  5 refills.  Is due for refill.    2. Has refills for Cymbalta and Lyrica -doesn't need refills.    3. Patient here for trigger point injections for myofascial pain  Consent done and on chart.  Cleaned areas with alcohol and injected using a 27 gauge 1.5 inch needle  Injected  9 cc- none wasted Using 1% Lidocaine with no EPI  Upper traps B/L - x2 B/L  Levators- B/L  Posterior scalenes Middle scalenes- B/L  Splenius Capitus- B/L  Pectoralis Major- B/L  Rhomboids- B/L x3 Infraspinatus Teres Major/minor- B/L  Thoracic paraspinals- B/L x3 Lumbar paraspinals- B/L x2 Other injections-    Patient's level of pain prior was 10/10- cannot be higher than 10 Current level of pain after injections is neck is down to 4-5/10 and back is [place] 8/10- usually kicks in later  There was no bleeding or complications.  Patient was advised to drink a lot of water on day after injections to flush system Will have increased soreness for 12-48 hours after injections.  Can use Lidocaine patches the day AFTER injections Can use theracane on day of injections in places didn't inject Can use heating pad 4-6 hours AFTER injections   4. F/U in 2months- trp Injections.

## 2023-08-31 NOTE — Progress Notes (Signed)
Pt is a 42 yr old female with BMI of 56- weight 390 lbs- down to 271 lbs today, DM, GAD, HTN,  and  CHF- chronic pain- Appears to have L T8 foraminal thoracic stenosis.  As well as fibromyalgia and myofascial pain syndrome.   Here for f/u on Chronic pain and trigger point injection    Pain is increased due to pain. Was 16 degrees this AM.  Back is locked up right now.  Afraid to bend over and afraid will get stuck.   Shed 2 ft of hair.  Weight of hair hurt her scalp and rubbed across her skin and irritating.   Prednisone helped a little when took 2 months ago.    No real difference by increasing Lyrica to 150 mg TID- Hacker taking it.     Plan: Will refill Tramadol 100 mg 2x/day- #120-  5 refills.  Is due for refill.    2. Has refills for Cymbalta and Lyrica -doesn't need refills.    3. Patient here for trigger point injections for myofascial pain  Consent done and on chart.  Cleaned areas with alcohol and injected using a 27 gauge 1.5 inch needle  Injected  9 cc- none wasted Using 1% Lidocaine with no EPI  Upper traps B/L - x2 B/L  Levators- B/L  Posterior scalenes Middle scalenes- B/L  Splenius Capitus- B/L  Pectoralis Major- B/L  Rhomboids- B/L x3 Infraspinatus Teres Major/minor- B/L  Thoracic paraspinals- B/L x3 Lumbar paraspinals- B/L x2 Other injections-    Patient's level of pain prior was 10/10- cannot be higher than 10 Current level of pain after injections is neck is down to 4-5/10 and back is [place] 8/10- usually kicks in later  There was no bleeding or complications.  Patient was advised to drink a lot of water on day after injections to flush system Will have increased soreness for 12-48 hours after injections.  Can use Lidocaine patches the day AFTER injections Can use theracane on day of injections in places didn't inject Can use heating pad 4-6 hours AFTER injections   4. F/U in 2months- trp Injections.    5. Not due for UDS  today- last  done 12/20/22.

## 2023-09-15 ENCOUNTER — Other Ambulatory Visit: Payer: Self-pay | Admitting: Family Medicine

## 2023-09-15 DIAGNOSIS — E1169 Type 2 diabetes mellitus with other specified complication: Secondary | ICD-10-CM

## 2023-10-10 ENCOUNTER — Ambulatory Visit: Payer: Medicaid Other | Attending: Cardiology | Admitting: Cardiology

## 2023-10-10 ENCOUNTER — Encounter: Payer: Self-pay | Admitting: Cardiology

## 2023-10-10 VITALS — BP 111/70 | HR 86 | Ht 70.5 in | Wt 368.0 lb

## 2023-10-10 DIAGNOSIS — I5032 Chronic diastolic (congestive) heart failure: Secondary | ICD-10-CM | POA: Diagnosis not present

## 2023-10-10 DIAGNOSIS — I25119 Atherosclerotic heart disease of native coronary artery with unspecified angina pectoris: Secondary | ICD-10-CM

## 2023-10-10 DIAGNOSIS — I10 Essential (primary) hypertension: Secondary | ICD-10-CM

## 2023-10-10 DIAGNOSIS — E782 Mixed hyperlipidemia: Secondary | ICD-10-CM | POA: Diagnosis not present

## 2023-10-10 NOTE — Progress Notes (Signed)
 Clinical Summary [name] is a 43 y.o.female seen today for follow up of the following medical problems.      1. CAD - prior anterior MI 09/2011, 95% lesion in mid LAD and ramus with 95% ostial stenosis. LVEF 55% by LV gram, apical hypokinesis. BMS to mid LAD and cutting balloon angioplasty of ramus.   - repeat cath 02/2012 with patent vessels and stent - 09/2011 Echo: LVEF 50% - 12/2013 Lexiscan  large anteroseptal ischemia - cath 12/2013 with patent vessels  - cath 02/2016 [facility] in setting of NSTEMI showed LM patent, LAD patent with patent stent, distal LAD 30-40%, LCX patent, RCA 30-40%. LVEDP 40, PCWP 30 mean PA 32, CI 4.93 - echo 02/2016 [place] LVEF 50%, apical akinesis       - 07/2018 cath mild to moderate disease other than small D1 99% too small for PCI. Normal LVEDP - we tried imdur  15mg  daily. Reported some episodes of shaking on this medication. Changed to ranexa      12/2019 admitted with NSTEMI - cath with LM patent, ostial LAD 40%, D1 99%, ramus ostial 90%, LCX patent, RCA prox 50%. Received DES to ramus        12/2022 cath: LM normal, ostial LAD 40%, mid LAD 99% and mid to distal 95%. Ostial D1 99%, ramus patent stents, LCX minimal LIs, RCA prox to distal 50%. LAD not favorable for PCI -denies significant chest pains. Compliant with meds.   - no significant chest pains - compliant with meds     2. Chronic diastolic HF - takes torsemide  prn, seems to control. Takes about 4 days a week.    3. HTN -compliant withmeds   4. Hyperlipidemia  - she is on crestor  40, repatha , zetia  10mg  daily - followed by lipid clinic  - Jan 2024 TC 113 TG 123 HDL 37 LDL 54  - complaint with meds     SH: husband with recent stent for MI [name] her father in law Mcentee passed July   Past Medical History:  Diagnosis Date   Allergy    Anemia    Anginal pain (HCC)    Anxiety    Arthritis    CHF (congestive heart failure) (HCC)    Chronic asthmatic bronchitis  (HCC) 08/17/2021   Chronic kidney disease    history of kidney stones   Coronary artery disease 09/2011   a.s/p BMS to LAD and angioplasty of RI in 09/2011 b. patent stent by cath in 02/2012, 12/2013, and 02/2016 with most recent showing 30-40% dLAD and 30-40% RCA stenosis   Depression    Diabetes mellitus    Dyspnea    GERD (gastroesophageal reflux disease)    History of kidney stones    Hyperlipidemia    Hypertension    Midsternal chest pain 03/03/2012   Migraines    Morbid obesity (HCC)    Myocardial infarct (HCC) 09/2011   Myocardial infarction (HCC) 02/2016   Neuromuscular disorder (HCC)    Palpitations 02/17/2012   Ureteral stone with hydronephrosis    Urinary tract obstruction due to kidney stone 02/15/2017     Allergies  Allergen Reactions   Bee Venom Anaphylaxis and Swelling   Latex Itching   Misc. Sulfonamide Containing Compounds Nausea And Vomiting   Sulfa Drugs Cross Reactors Nausea And Vomiting     Current Outpatient Medications  Medication Sig Dispense Refill   Accu-Chek Softclix Lancets lancets CHECK BOOD SUGAR FOUR TIMES DAILY Dx E11.9 400 each 3  acetaminophen  (TYLENOL ) 500 MG tablet Take 1,000 mg by mouth every 6 (six) hours as needed for moderate pain.     albuterol  (VENTOLIN  HFA) 108 (90 Base) MCG/ACT inhaler Inhale 2 puffs into the lungs every 4 (four) hours as needed for wheezing or shortness of breath. 8 g 2   amLODipine  (NORVASC ) 5 MG tablet TAKE 1 TABLET BY MOUTH DAILY 30 tablet 6   aspirin  81 MG chewable tablet Chew 81 mg by mouth daily.     bisoprolol  (ZEBETA ) 5 MG tablet TAKE 1 TABLET BY MOUTH TWICE DAILY 60 tablet 3   blood glucose meter kit and supplies Dispense based on patient and insurance preference. Use up to four times daily as directed. (FOR ICD-10:  E11.22). Check BG daily 1 each 0   Blood Glucose Monitoring Suppl (ACCU-CHEK GUIDE) w/Device KIT Test BS 4 times daily Dx E11.9 1 kit 0   budesonide -formoterol  (SYMBICORT ) 160-4.5 MCG/ACT  inhaler INHALE TWO PUFFS first thing IN THE MORNING AND THEN INHALE another 2 PUFFS about 12 HOURS LATER (Patient taking differently: 1 puff 2 (two) times daily.) 10.2 g 11   busPIRone  (BUSPAR ) 10 MG tablet TAKE 1 TABLET BY MOUTH TWICE DAILY 60 tablet 1   clopidogrel  (PLAVIX ) 75 MG tablet TAKE 1 TABLET BY MOUTH EVERY DAY 90 tablet 2   DULoxetine  (CYMBALTA ) 60 MG capsule Take 1 capsule (60 mg total) by mouth at bedtime. 90 capsule 1   ezetimibe  (ZETIA ) 10 MG tablet TAKE 1 TABLET BY MOUTH EVERY DAY 90 tablet 2   fenofibrate  (TRICOR ) 48 MG tablet TAKE 1 TABLET BY MOUTH DAILY 30 tablet 1   FLUoxetine  (PROZAC ) 20 MG capsule TAKE ONE CAPSULE BY MOUTH EVERY DAY GIVE WITH CYMBALTA  DUE TO NERVE PAIN AND ANXIETY/DEPRESSION 90 capsule 1   glucose blood (ACCU-CHEK GUIDE TEST) test strip CHECK BOOD SUGAR FOUR TIMES DAILY Dx E11.9 400 strip 3   losartan  (COZAAR ) 25 MG tablet TAKE 1 TABLET BY MOUTH DAILY 90 tablet 2   medroxyPROGESTERone  (PROVERA ) 5 MG tablet TAKE 1 TABLET BY MOUTH DAILY 90 tablet 0   metFORMIN  (GLUCOPHAGE ) 1000 MG tablet TAKE 1 TABLET BY MOUTH TWICE DAILY 180 tablet 2   nitroGLYCERIN  (NITROSTAT ) 0.4 MG SL tablet DISSOLVE 1 TABLET UNDER THE TONGUE EVERY 5 MINUTES AS NEEDED FOR CHEST PAIN. DO NOT EXCEED A TOTAL OF 3 DOSES IN 15 MINUTES. 25 tablet 3   potassium chloride  SA (KLOR-CON  M) 20 MEQ tablet TAKE 1 TABLET BY MOUTH EVERY DAY AS NEEDED (when taking torsemide ) 90 tablet 3   pregabalin  (LYRICA ) 150 MG capsule Take 1 capsule (150 mg total) by mouth 3 (three) times daily. 90 capsule 5   ranolazine  (RANEXA ) 1000 MG Stafford tablet TAKE 1 TABLET BY MOUTH TWICE DAILY 180 tablet 2   REPATHA  SURECLICK 140 MG/ML SOAJ INJECT 140 MG UNDER THE SKIN EVERY 14 DAYS 2 mL 2   rosuvastatin  (CRESTOR ) 40 MG tablet TAKE 1 TABLET BY MOUTH DAILY 30 tablet 1   Semaglutide , 1 MG/DOSE, (OZEMPIC , 1 MG/DOSE,) 4 MG/3ML SOPN INJECT 1 MG WEEKLY (TO REPLACE 0.5MG  OZEMPIC ) 9 mL 0   torsemide  (DEMADEX ) 20 MG tablet TAKE 2 TABLETS BY  MOUTH DAILY AS NEEDED FOR swelling 180 tablet 3   traMADol  (ULTRAM ) 50 MG tablet Take 2 tablets (100 mg total) by mouth 2 (two) times daily. 120 tablet 5   VASCEPA  1 g capsule TAKE TWO CAPSULES BY MOUTH TWICE DAILY 360 capsule 2   No current facility-administered medications for this visit.  Past Surgical History:  Procedure Laterality Date   BIOPSY  10/27/2020   Procedure: BIOPSY;  Surgeon: Shaaron Lamar HERO, MD;  Location: [facility];  Service: Endoscopy;;   CARDIAC CATHETERIZATION  10/08/2011   LAD: 95% mid, Ramus: 95% ostial   CARPAL TUNNEL RELEASE     CESAREAN SECTION     CHOLECYSTECTOMY     CORONARY ANGIOPLASTY WITH STENT PLACEMENT  10/08/2011   LAD: BMS, Ramus: cutting balloon angioplasty   CORONARY STENT INTERVENTION N/A 01/14/2020   Procedure: CORONARY STENT INTERVENTION;  Surgeon: Anner Alm ORN, MD;  Location: [facility] INVASIVE CV LAB;  Service: Cardiovascular;  Laterality: N/A;   CYSTOSCOPY WITH RETROGRADE PYELOGRAM, URETEROSCOPY AND STENT PLACEMENT Left 02/16/2017   Procedure: CYSTOSCOPY WITH RETROGRADE PYELOGRAM, URETEROSCOPY AND STENT PLACEMENT;  Surgeon: Ottelin, Mark, MD;  Location: [facility];  Service: Urology;  Laterality: Left;   CYSTOSCOPY WITH RETROGRADE PYELOGRAM, URETEROSCOPY AND STENT PLACEMENT Left 03/28/2017   Procedure: LEFT STENT REMOVAL LEFT RETROGRADE PYELOGRAM LEFT URETEROSCOPY   LASER LITHOTRIPSY  AND  STENT REPLACEMENT;  Surgeon: Sherrilee Belvie CROME, MD;  Location: [facility];  Service: Urology;  Laterality: Left;   CYSTOSCOPY WITH RETROGRADE PYELOGRAM, URETEROSCOPY AND STENT PLACEMENT Left 04/13/2022   Procedure: CYSTOSCOPY WITH RETROGRADE PYELOGRAM, URETEROSCOPY AND STENT PLACEMENT, BASKET EXTRACTION;  Surgeon: Sherrilee Belvie CROME, MD;  Location: [facility];  Service: Urology;  Laterality: Left;   ESOPHAGOGASTRODUODENOSCOPY (EGD) WITH PROPOFOL  N/A 10/27/2020   Rourk: mild erosive reflux esophagitis, h.pylori gastritis   LEFT HEART CATH AND CORONARY ANGIOGRAPHY N/A  08/15/2018   Procedure: LEFT HEART CATH AND CORONARY ANGIOGRAPHY;  Surgeon: Jordan, Peter M, MD;  Location: [facility] INVASIVE CV LAB;  Service: Cardiovascular;  Laterality: N/A;   LEFT HEART CATH AND CORONARY ANGIOGRAPHY N/A 01/14/2020   Procedure: LEFT HEART CATH AND CORONARY ANGIOGRAPHY;  Surgeon: Anner Alm ORN, MD;  Location: [facility] INVASIVE CV LAB;  Service: Cardiovascular;  Laterality: N/A;   LEFT HEART CATH AND CORONARY ANGIOGRAPHY N/A 01/18/2023   Procedure: LEFT HEART CATH AND CORONARY ANGIOGRAPHY;  Surgeon: Anner Alm ORN, MD;  Location: [facility] INVASIVE CV LAB;  Service: Cardiovascular;  Laterality: N/A;   LEFT HEART CATHETERIZATION WITH CORONARY ANGIOGRAM N/A 10/08/2011   Procedure: LEFT HEART CATHETERIZATION WITH CORONARY ANGIOGRAM;  Surgeon: Peter M Jordan, MD;  Location: [facility] CATH LAB;  Service: Cardiovascular;  Laterality: N/A;   LEFT HEART CATHETERIZATION WITH CORONARY ANGIOGRAM N/A 03/02/2012   Procedure: LEFT HEART CATHETERIZATION WITH CORONARY ANGIOGRAM;  Surgeon: Lonni JONETTA Cash, MD;  Location: [facility] CATH LAB;  Service: Cardiovascular;  Laterality: N/A;   LEFT HEART CATHETERIZATION WITH CORONARY ANGIOGRAM N/A 01/04/2014   Procedure: LEFT HEART CATHETERIZATION WITH CORONARY ANGIOGRAM;  Surgeon: Debby DELENA Sor, MD;  Location: [facility] CATH LAB;  Service: Cardiovascular;  Laterality: N/A;   right hand pinky surgery       Allergies  Allergen Reactions   Bee Venom Anaphylaxis and Swelling   Latex Itching   Misc. Sulfonamide Containing Compounds Nausea And Vomiting   Sulfa Drugs Cross Reactors Nausea And Vomiting      Family History  Problem Relation Age of Onset   Heart murmur Father    Diabetes Father    Hypertension Father    Hyperlipidemia Father    Cancer Father        throat   Diabetes Mother    Cancer Mother        breast.uterine, ovarian   Asthma Son    Appendicitis Son    Hernia Son  Mental illness Son        Bipolar, personality d/o   Stroke Maternal Grandmother     Cancer Maternal Grandmother        breast   Cancer Paternal Grandfather        lung   Colon cancer Neg Hx      Social History Ms. Anello reports that she has been smoking cigarettes. She started smoking about 33 years ago. She has a 16.8 pack-year smoking history. She has never used smokeless tobacco. Ms. Boulais reports that she does not currently use alcohol.   Review of Systems CONSTITUTIONAL: No weight loss, fever, chills, weakness or fatigue.  HEENT: Eyes: No visual loss, blurred vision, double vision or yellow sclerae.No hearing loss, sneezing, congestion, runny nose or sore throat.  SKIN: No rash or itching.  CARDIOVASCULAR: per hpi RESPIRATORY: per hpi  GASTROINTESTINAL: No anorexia, nausea, vomiting or diarrhea. No abdominal pain or blood.  GENITOURINARY: No burning on urination, no polyuria NEUROLOGICAL: No headache, dizziness, syncope, paralysis, ataxia, numbness or tingling in the extremities. No change in bowel or bladder control.  MUSCULOSKELETAL: No muscle, back pain, joint pain or stiffness.  LYMPHATICS: No enlarged nodes. No history of splenectomy.  PSYCHIATRIC: No history of depression or anxiety.  ENDOCRINOLOGIC: No reports of sweating, cold or heat intolerance. No polyuria or polydipsia.  SABRA   Physical Examination Today's Vitals   10/10/23 1119  BP: 111/70  Pulse: 86  SpO2: 98%  Weight: (!) 368 lb (166.9 kg)  Height: 5' 10.5 (1.791 m)   Body mass index is 52.06 kg/m.  Gen: resting comfortably, no acute distress HEENT: no scleral icterus, pupils equal round and reactive, no palptable cervical adenopathy,  CV: RRR, no mrg, no jvd Resp: Clear to auscultation bilaterally GI: abdomen is soft, non-tender, non-distended, normal bowel sounds, no hepatosplenomegaly MSK: extremities are warm, no edema.  Skin: warm, no rash Neuro:  no focal deficits Psych: appropriate affect   Diagnostic Studies  02/2012 Cath Hemodynamic Findings:   Central aortic  pressure: 108/70   Left ventricular pressure: 123/10/21   Angiographic Findings:   Left main: No obstructive disease noted.   Left Anterior Descending Artery: Large caliber vessel that courses to the apex. There is a stent present in the mid vessel that is widely patent with no restenosis. The remainder of the LAD is disease free. There is a moderate sized diagonal Menucha Dicesare with 30% proximal stenosis.   Circumflex Artery: Dominant, large caliber vessel with no disease throughout the AV groove segment. There is small to moderate sized intermediate Jerren Flinchbaugh that has ostial 30% stenosis. The remainder of the Circumflex has no obstructive disease.   Right Coronary Artery: Small, non-dominant vessel. No disease noted.   Left Ventricular Angiogram: LVEF 50-55%.   Impression:   1. Double vessel CAD with patent stent mid LAD and patent angioplasty site at ostium of intermediate Ireland Chagnon.   2. Normal LV systolic function     09/2011 Cath PROCEDURAL FINDINGS   Hemodynamics:   AO 135/92 with a mean of 114 mmHg   LV 136/25 mmHg   Coronary angiography:   Coronary dominance: Left   Left mainstem: Normal.   Left anterior descending (LAD): There is a 95% stenosis in the mid LAD immediately after the takeoff of the first diagonal. The first diagonal Ester Mabe is moderate in size and appears normal. The LAD stenosis does appear to be hazy.   There is a moderately large ramus intermediate Cullan Launer which has a 95% ostial stenosis.  Left circumflex (LCx): This is a dominant vessel and appears normal throughout.   Right coronary artery (RCA): This is a small nondominant vessel and is normal.   Left ventriculography: Left ventricular systolic function is abnormal with apical hypokinesis. LVEF is estimated at 55%, there is no significant mitral regurgitation   PCI Note: Following the diagnostic procedure, the decision was made to proceed with PCI of the LAD. Weight-based bivalirudin  was given for anticoagulation. Once a  therapeutic ACT was achieved, a 5 French EBU guide catheter was inserted. A pro-water  coronary guidewire was used to cross the lesion. The lesion was predilated with a 2.5 mm balloon. The lesion was then stented with a 3.0 x 18 mm vision stent. The stent was postdilated with a 3.25 mm noncompliant balloon. Following PCI, there was 0% residual stenosis and TIMI-3 flow. At this point the patient's pain was improved but was Anthes of moderate intensity. We then proceeded to intervene on the ostial ramus intermediate lesion. This was crossed with the prowater wire. This was dilated using a 3.0 x 10 mm cutting balloon performing 3 inflations to 6 atmospheres. This showed a good angiographic result with less than 30% residual stenosis and TIMI grade 3 flow. Final angiography confirmed an excellent result. The patient tolerated the procedure well. There were no immediate procedural complications. A TR band was used for radial hemostasis. The patient was transferred to the post catheterization recovery area for further monitoring.   PCI Data:   Vessel - LAD/Segment - mid vessel immediately after the takeoff of the first diagonal.   Percent Stenosis (pre) 95%   TIMI-flow 3.   Stent 3.0 x 18 mm vision stent.   Percent Stenosis (post) 0%   TIMI-flow (post) 3.   Second vessel: Ostial ramus intermediate Tinslee Klare   Percent stenosis: 95%.   TIMI flow 3.   3.0 mm cutting balloon   Percent stenosis (post) less than 30%   TIMI flow 3   Final Conclusions:   1. Severe 2 vessel obstructive coronary disease.   2. Overall well preserved LV systolic function with apical wall motion abnormality and ejection fraction of 55%.   3. Successful intracoronary stenting of the mid LAD with a bare-metal stent.   4. Successful cutting balloon angioplasty of the ramus intermediate Dallon Dacosta.   Recommendations:   Continue therapy with Plavix . Patient reports a history of allergy to aspirin . Aggressive risk factor modification.    Disposition: Patient be observed in the intensive care unit tonight. If she has no complications she should be able to be transferred to telemetry in the morning.     12/2013 Cath HEMODYNAMICS:    Central Aorta: 100/60    Left Ventricle: 100/3   ANGIOGRAPHY:   1. Left main: Normal and trifurcated into an LAD, ramus intermediate vessel, and a dominant left circumflex coronary artery   2. LAD: Tamala gave rise to a proximal large first diagonal vessel.  The LAD just after the diagonal vessel had a widely patent stent.  The remainder of the LAD was free of significant disease and extended to the LV apex. 3. Ramus Intermediate: No evidence for restenosis at previous site of ostial cutting balloon intervention. 4. Left circumflex: Angiographically normal.  Dominant vessel, which gave rise to 2 marginal branches and in the PDA.SABRA   4. Right coronary artery: Angiographically normal, nondominant vessel   Left ventriculography revealed global LV function.  There was a suggestion of mild residual distal anterolateral hypocontractility.  There was catheter-induced mitral regurgitation.  IMPRESSION:   Normal LV function with mild distal anterolateral, residual hypocontractility   No significant residual coronary obstructive disease with evidence for widely patent LAD stent, no evidence for restenosis of the ramus intermediate vessel, normal dominant left circumflex coronary artery and normal nondominant right coronary artery.       12/2013 MPI IMPRESSION: 1.  Abnormal Lexiscan  for ischemia   2.  Large anteroseptal and apical area of ischemia   3. High risk study for major cardiac events based on area of myocardium at [facility]   4. Normal left ventricular systolic function, left ventricular ejection fraction 52%     12/2019 cath A drug-eluting stent was successfully placed using a STENT RESOLUTE ONYX 2.5X30. Postdilated to 2.7 mm Post intervention, there is a 0% residual stenosis  throughout the entire segment.SABRA ------------ Lida LAD lesion is 40% stenosed. Prox LAD previously placed BMS stent is 30% stenosed. Prox LAD to Mid LAD lesion is 20% stenosed. Ost 1st Diag lesion is 99% stenosed. Known from prior catheterizations Small, nondominant RCA: Prox RCA to Dist RCA lesion is 50% stenosed. -------------- The left ventricular systolic function is normal. The left ventricular ejection fraction is 55-65% by visual estimate. LV end diastolic pressure is normal.   CULPRIT LESION: Ostial and proximal RAMUS INTERMEDIUS (RI) 95-70%  Successful DES PCI of RI (RESOLUTE ONYX DES 2.5 mm x 30 mm--2.7 mm) Otherwise moderate diffuse disease: Diffuse moderate LAD 20-30% stenosis and known 99% subtotal occlusion of the very small caliber 1st Diag Preserved LVEF and normal EDP.     12/2022 cath Ost LAD lesion is 40% stenosed.   Previously placed proximal LAD bare-metal stent has roughly 10% restenosis.  The jailed caliber first diagonal Danae Oland has diffuse 99 % disease.   Prox LAD to Mid LAD lesion is 20% stenosed.   CULPRIT LESION SEGMENT: Distal LAD lesion is 99% stenosed followed by a long segment of 95% stenosis.  Not favorable for PCI.   Previously placed Ost Ramus to Ramus drug-eluting stent was is widely patent.   Small caliber, nondominant RCA: Prox RCA to Dist RCA lesion is 50% stenosed.   ------------------------------------------------   The left ventricular systolic function is normal.  The left ventricular ejection fraction is 55-65% by visual estimate.   LV end diastolic pressure is normal.   There is no aortic valve stenosis.   POST CATH DIAGNOSES Widely patent proximal LAD stent and ostial to optimal RI DES stent. Culprit lesion is distal LAD subtotal occlusion involving the apical portion of the wraparound LAD. Very long segment and the small caliber of also. Not favorable for PCI-recommend medical management. Otherwise normal LV function and LVEDP. No obvious wall  motion normality.      RECOMMENDATIONS Continue aggressive medical of anginal symptoms.  I have added Imdur  30 mg daily to Ranexa , bisoprolol  and amlodipine . Will review with interventional colleagues, however I do not think that this would be a favorable lesion to tackle percutaneously based on the length of the lesion and the size the downstream vessel.  Previous images to indicate that is a relatively sizable vessel, but now it appears to be less than 2 mm.  The distance is probably at least 30 if not 40 mm.     Assessment and Plan  1. CAD with angina pectoris - recent cath as reported above, essentially distal small vessel LAD disease not amenable to pci - no recent symptoms. Room to titrate norvasc  or bisoprolol  if needed. Did not tolerate imdur  in the past,  she is on highest ranexa  dose - due to disease burden have elected for indefinite DAPT - chronic stable infrequent symptoms, continue current meds     2. Chronic diastolic HF - euvolemic and without symptoms, continue current meds     3. HTN -at goal, continue current meds   4. Hyperlipidemia - at goal, continue current meds   F/u 6 months      Dorn PHEBE Ross, M.D.

## 2023-10-10 NOTE — Patient Instructions (Signed)
 Medication Instructions:  Your physician recommends that you continue on your current medications as directed. Please refer to the Current Medication list given to you today.  *If you need a refill on your cardiac medications before your next appointment, please call your pharmacy*   Lab Work: None If you have labs (blood work) drawn today and your tests are completely normal, you will receive your results only by: MyChart Message (if you have MyChart) OR A paper copy in the mail If you have any lab test that is abnormal or we need to change your treatment, we will call you to review the results.   Testing/Procedures: None   Follow-Up: At [facility], you and your health needs are our priority.  As part of our continuing mission to provide you with exceptional heart care, we have created designated Provider Care Teams.  These Care Teams include your primary Cardiologist (physician) and Advanced Practice Providers (APPs -  Physician Assistants and Nurse Practitioners) who all work together to provide you with the care you need, when you need it.  We recommend signing up for the patient portal called "MyChart".  Sign up information is provided on this After Visit Summary.  MyChart is used to connect with patients for Virtual Visits (Telemedicine).  Patients are able to view lab/test results, encounter notes, upcoming appointments, etc.  Non-urgent messages can be sent to your provider as well.   To learn more about what you can do with MyChart, go to [email or web address].    Your next appointment:   6 month(s)  Provider:   You may see Dina Rich, MD or one of the following Advanced Practice Providers on your designated Care Team:   Randall An, PA-C  Jacolyn Reedy, [place]     Other Instructions

## 2023-10-13 ENCOUNTER — Other Ambulatory Visit: Payer: Self-pay | Admitting: Family Medicine

## 2023-10-13 ENCOUNTER — Other Ambulatory Visit: Payer: Self-pay | Admitting: Cardiology

## 2023-10-13 DIAGNOSIS — F411 Generalized anxiety disorder: Secondary | ICD-10-CM

## 2023-10-13 DIAGNOSIS — E1169 Type 2 diabetes mellitus with other specified complication: Secondary | ICD-10-CM

## 2023-10-13 DIAGNOSIS — F418 Other specified anxiety disorders: Secondary | ICD-10-CM

## 2023-10-21 ENCOUNTER — Other Ambulatory Visit: Payer: Self-pay | Admitting: Cardiology

## 2023-10-26 ENCOUNTER — Ambulatory Visit (INDEPENDENT_AMBULATORY_CARE_PROVIDER_SITE_OTHER): Payer: Medicaid Other | Admitting: Family Medicine

## 2023-10-26 ENCOUNTER — Encounter: Payer: Self-pay | Admitting: Family Medicine

## 2023-10-26 ENCOUNTER — Telehealth: Payer: Self-pay

## 2023-10-26 VITALS — BP 120/76 | HR 82 | Temp 97.9°F | Ht 70.0 in | Wt 369.0 lb

## 2023-10-26 DIAGNOSIS — E1159 Type 2 diabetes mellitus with other circulatory complications: Secondary | ICD-10-CM

## 2023-10-26 DIAGNOSIS — Z7985 Long-term (current) use of injectable non-insulin antidiabetic drugs: Secondary | ICD-10-CM

## 2023-10-26 DIAGNOSIS — N3946 Mixed incontinence: Secondary | ICD-10-CM | POA: Diagnosis not present

## 2023-10-26 DIAGNOSIS — E785 Hyperlipidemia, unspecified: Secondary | ICD-10-CM | POA: Diagnosis not present

## 2023-10-26 DIAGNOSIS — E1169 Type 2 diabetes mellitus with other specified complication: Secondary | ICD-10-CM

## 2023-10-26 DIAGNOSIS — I152 Hypertension secondary to endocrine disorders: Secondary | ICD-10-CM | POA: Diagnosis not present

## 2023-10-26 DIAGNOSIS — R5382 Chronic fatigue, unspecified: Secondary | ICD-10-CM | POA: Diagnosis not present

## 2023-10-26 DIAGNOSIS — Z7901 Long term (current) use of anticoagulants: Secondary | ICD-10-CM

## 2023-10-26 DIAGNOSIS — E119 Type 2 diabetes mellitus without complications: Secondary | ICD-10-CM | POA: Diagnosis not present

## 2023-10-26 LAB — BAYER DCA HB A1C WAIVED: HB A1C (BAYER DCA - WAIVED): 5.2 % (ref 4.8–5.6)

## 2023-10-26 MED ORDER — METFORMIN HCL 1000 MG PO TABS: 1000.0000 mg | ORAL_TABLET | Freq: Every day | ORAL | 3 refills | Status: DC

## 2023-10-26 NOTE — Progress Notes (Signed)
Subjective: CC:DM PCP: Katelyn Ip, DO ZOX:[id number] [name] is a 43 y.o. female presenting to clinic today for:  1. Type 2 Diabetes with hypertension, hyperlipidemia with history of CAD/MI:  Compliant with all medications.  She has been under more stress lately.  She is smoking some and reports it is roughly 1 pack/week.  She reports no abnormal bleeding. Diabetes Health Maintenance Due  Topic Date Due   OPHTHALMOLOGY EXAM  09/29/2023   FOOT EXAM  10/21/2023   HEMOGLOBIN A1C  01/23/2024    Last A1c:  Lab Results  Component Value Date   HGBA1C 5.5 07/25/2023    ROS: Denies dizziness, LOC, polyuria, polydipsia, unintended weight loss/gain, foot ulcerations, numbness or tingling in extremities, shortness of breath or chest pain.  She reports fatigue has been ongoing for about 6 months.  No blood loss that she is seen  2.  Urinary incontinence This has been evaluated by urology, Dr. Ronne Binning.  She reports that it was felt to be may be secondary to an autonomic dysfunction in the setting of fibromyalgia.  She reports that she has urge incontinence and that it just flows out when she goes to stand to go to the bathroom.  She uses a pad up to 5 times daily.  No fecal incontinence  ROS: Per HPI  Allergies  Allergen Reactions   Bee Venom Anaphylaxis and Swelling   Latex Itching   Misc. Sulfonamide Containing Compounds Nausea And Vomiting   Sulfa Drugs Cross Reactors Nausea And Vomiting   Past Medical History:  Diagnosis Date   Allergy    Anemia    Anginal pain (HCC)    Anxiety    Arthritis    CHF (congestive heart failure) (HCC)    Chronic asthmatic bronchitis (HCC) 08/17/2021   Chronic kidney disease    history of kidney stones   Coronary artery disease 09/2011   a.s/p BMS to LAD and angioplasty of RI in 09/2011 b. patent stent by cath in 02/2012, 12/2013, and 02/2016 with most recent showing 30-40% dLAD and 30-40% RCA stenosis   Depression    Diabetes mellitus     Dyspnea    GERD (gastroesophageal reflux disease)    History of kidney stones    Hyperlipidemia    Hypertension    Midsternal chest pain 03/03/2012   Migraines    Morbid obesity (HCC)    Myocardial infarct (HCC) 09/2011   Myocardial infarction (HCC) 02/2016   Neuromuscular disorder (HCC)    Palpitations 02/17/2012   Ureteral stone with hydronephrosis    Urinary tract obstruction due to kidney stone 02/15/2017    Current Outpatient Medications:    Accu-Chek Softclix Lancets lancets, CHECK BOOD SUGAR FOUR TIMES DAILY Dx E11.9, Disp: 400 each, Rfl: 3   acetaminophen (TYLENOL) 500 MG tablet, Take 1,000 mg by mouth every 6 (six) hours as needed for moderate pain., Disp: , Rfl:    albuterol (VENTOLIN HFA) 108 (90 Base) MCG/ACT inhaler, Inhale 2 puffs into the lungs every 4 (four) hours as needed for wheezing or shortness of breath., Disp: 8 g, Rfl: 2   amLODipine (NORVASC) 5 MG tablet, TAKE 1 TABLET BY MOUTH DAILY, Disp: 30 tablet, Rfl: 6   aspirin 81 MG chewable tablet, Chew 81 mg by mouth daily., Disp: , Rfl:    bisoprolol (ZEBETA) 5 MG tablet, TAKE 1 TABLET BY MOUTH TWICE DAILY, Disp: 60 tablet, Rfl: 3   blood glucose meter kit and supplies, Dispense based on patient and insurance preference.  Use up to four times daily as directed. (FOR ICD-10:  E11.22). Check BG daily, Disp: 1 each, Rfl: 0   Blood Glucose Monitoring Suppl (ACCU-CHEK GUIDE) w/Device KIT, Test BS 4 times daily Dx E11.9, Disp: 1 kit, Rfl: 0   budesonide-formoterol (SYMBICORT) 160-4.5 MCG/ACT inhaler, INHALE TWO PUFFS first thing IN THE MORNING AND THEN INHALE another 2 PUFFS about 12 HOURS LATER (Patient taking differently: 1 puff 2 (two) times daily.), Disp: 10.2 g, Rfl: 11   busPIRone (BUSPAR) 10 MG tablet, TAKE 1 TABLET BY MOUTH TWICE DAILY, Disp: 60 tablet, Rfl: 0   clopidogrel (PLAVIX) 75 MG tablet, TAKE 1 TABLET BY MOUTH EVERY DAY, Disp: 90 tablet, Rfl: 2   DULoxetine (CYMBALTA) 60 MG capsule, Take 1 capsule (60 mg  total) by mouth at bedtime., Disp: 90 capsule, Rfl: 1   ezetimibe (ZETIA) 10 MG tablet, TAKE 1 TABLET BY MOUTH EVERY DAY, Disp: 90 tablet, Rfl: 2   fenofibrate (TRICOR) 48 MG tablet, TAKE 1 TABLET BY MOUTH DAILY, Disp: 30 tablet, Rfl: 0   FLUoxetine (PROZAC) 20 MG capsule, TAKE ONE CAPSULE BY MOUTH EVERY DAY GIVE WITH CYMBALTA DUE TO NERVE PAIN AND ANXIETY/DEPRESSION, Disp: 90 capsule, Rfl: 1   glucose blood (ACCU-CHEK GUIDE TEST) test strip, CHECK BOOD SUGAR FOUR TIMES DAILY Dx E11.9, Disp: 400 strip, Rfl: 3   losartan (COZAAR) 25 MG tablet, TAKE 1 TABLET BY MOUTH DAILY, Disp: 90 tablet, Rfl: 2   medroxyPROGESTERone (PROVERA) 5 MG tablet, TAKE 1 TABLET BY MOUTH DAILY, Disp: 90 tablet, Rfl: 0   metFORMIN (GLUCOPHAGE) 1000 MG tablet, TAKE 1 TABLET BY MOUTH TWICE DAILY, Disp: 180 tablet, Rfl: 2   nitroGLYCERIN (NITROSTAT) 0.4 MG SL tablet, DISSOLVE 1 TABLET UNDER THE TONGUE EVERY 5 MINUTES AS NEEDED FOR CHEST PAIN. DO NOT EXCEED A TOTAL OF 3 DOSES IN 15 MINUTES., Disp: 25 tablet, Rfl: 3   potassium chloride SA (KLOR-CON M) 20 MEQ tablet, TAKE 1 TABLET BY MOUTH EVERY DAY AS NEEDED (when taking torsemide), Disp: 90 tablet, Rfl: 3   pregabalin (LYRICA) 150 MG capsule, Take 1 capsule (150 mg total) by mouth 3 (three) times daily., Disp: 90 capsule, Rfl: 5   ranolazine (RANEXA) 1000 MG SR tablet, TAKE 1 TABLET BY MOUTH TWICE DAILY, Disp: 180 tablet, Rfl: 2   REPATHA SURECLICK 140 MG/ML SOAJ, INJECT 140 MG UNDER THE SKIN EVERY 14 DAYS, Disp: 2 mL, Rfl: 2   rosuvastatin (CRESTOR) 40 MG tablet, TAKE 1 TABLET BY MOUTH DAILY, Disp: 30 tablet, Rfl: 0   Semaglutide, 1 MG/DOSE, (OZEMPIC, 1 MG/DOSE,) 4 MG/3ML SOPN, INJECT 1 MG WEEKLY (TO REPLACE 0.5MG  OZEMPIC), Disp: 9 mL, Rfl: 0   torsemide (DEMADEX) 20 MG tablet, TAKE 2 TABLETS BY MOUTH DAILY AS NEEDED FOR swelling, Disp: 180 tablet, Rfl: 3   traMADol (ULTRAM) 50 MG tablet, Take 2 tablets (100 mg total) by mouth 2 (two) times daily., Disp: 120 tablet, Rfl: 5    VASCEPA 1 g capsule, TAKE TWO CAPSULES BY MOUTH TWICE DAILY, Disp: 360 capsule, Rfl: 2 Social History   Socioeconomic History   Marital status: Married    Spouse name: Ree Stafford   Number of children: 2   Years of education: 10   Highest education level: Not on file  Occupational History   Not on file  Tobacco Use   Smoking status: Some Days    Current packs/day: 0.50    Average packs/day: 0.5 packs/day for 33.6 years (16.8 ttl pk-yrs)    Types: Cigarettes  Start date: 03/18/1990   Smokeless tobacco: Never   Tobacco comments:    Vapes currently   Vaping Use   Vaping status: Some Days   Substances: Nicotine  Substance and Sexual Activity   Alcohol use: Not Currently    Alcohol/week: 0.0 standard drinks of alcohol    Comment: maybe once a year   Drug use: No   Sexual activity: Yes    Birth control/protection: None    Comment: PCOS  Other Topics Concern   Not on file  Social History Narrative   Unemployed   Applied for disability   Leisure "take care of house and kids"   Walks when able      She is working to get on disability. She previously worked on Production designer, theatre/television/film.      Social Drivers of Corporate investment banker Strain: Low Risk  (10/28/2017)   Overall Financial Resource Strain (CARDIA)    Difficulty of Paying Living Expenses: Not hard at all  Food Insecurity: No Food Insecurity (11/12/2022)   Hunger Vital Sign    Worried About Running Out of Food in the Last Year: Never true    Ran Out of Food in the Last Year: Never true  Transportation Needs: No Transportation Needs (11/12/2022)   PRAPARE - Administrator, Civil Service (Medical): No    Lack of Transportation (Non-Medical): No  Physical Activity: Inactive (10/28/2017)   Exercise Vital Sign    Days of Exercise per Week: 0 days    Minutes of Exercise per Session: 0 min  Stress: Stress Concern Present (10/28/2017)   [employer] of Occupational Health - Occupational Stress Questionnaire     Feeling of Stress : To some extent  Social Connections: Somewhat Isolated (10/28/2017)   Social Connection and Isolation Panel [NHANES]    Frequency of Communication with Friends and Family: More than three times a week    Frequency of Social Gatherings with Friends and Family: More than three times a week    Attends Religious Services: Never    Database administrator or Organizations: No    Attends Banker Meetings: Never    Marital Status: Married  Catering manager Violence: Not At Risk (11/12/2022)   Humiliation, Afraid, Rape, and Kick questionnaire    Fear of Current or Ex-Partner: No    Emotionally Abused: No    Physically Abused: No    Sexually Abused: No   Family History  Problem Relation Age of Onset   Heart murmur Father    Diabetes Father    Hypertension Father    Hyperlipidemia Father    Cancer Father        throat   Diabetes Mother    Cancer Mother        breast.uterine, ovarian   Asthma Son    Appendicitis Son    Hernia Son    Mental illness Son        Bipolar, personality d/o   Stroke Maternal Grandmother    Cancer Maternal Grandmother        breast   Cancer Paternal Grandfather        lung   Colon cancer Neg Hx     Objective: Office vital signs reviewed. BP 120/76   Pulse 82   Temp 97.9 F (36.6 C)   Ht 5\' 10"  (1.778 m)   Wt (!) 369 lb (167.4 kg)   SpO2 98%   BMI 52.95 kg/m   Physical Examination:  General:  Awake, alert, morbidly obese, No acute distress HEENT: sclera white, MMM Cardio: regular rate and rhythm, S1S2 heard, no murmurs appreciated Pulm: clear to auscultation bilaterally, no wheezes, rhonchi or rales; normal work of breathing on room air MSK: antalgic gait. Ambulating independently     10/26/2023   11:33 AM 07/25/2023    8:06 AM 07/01/2023   10:59 AM  Depression screen PHQ 2/9  Decreased Interest 2 2 1   Down, Depressed, Hopeless 0 1 1  PHQ - 2 Score 2 3 2   Altered sleeping 3 3   Tired, decreased energy 1 1    Change in appetite 0 0   Feeling bad or failure about yourself  2 1   Trouble concentrating 0 0   Moving slowly or fidgety/restless 0 0   Suicidal thoughts 0 0   PHQ-9 Score 8 8   Difficult doing work/chores Somewhat difficult Somewhat difficult       10/26/2023   11:33 AM 07/25/2023    8:06 AM 02/18/2023    8:02 AM 10/20/2022    8:02 AM  GAD 7 : Generalized Anxiety Score  Nervous, Anxious, on Edge 1 1 2 2   Control/stop worrying 1 1 1 1   Worry too much - different things 1 1 1 1   Trouble relaxing 2 2 2 2   Restless 0 0 0 0  Easily annoyed or irritable 2 2 2 2   Afraid - awful might happen 0 1 0 0  Total GAD 7 Score 7 8 8 8   Anxiety Difficulty Somewhat difficult Somewhat difficult Somewhat difficult Somewhat difficult    Assessment/ Plan: 43 y.o. female   Diabetes mellitus treated with injections of non-insulin medication (HCC) - Plan: Bayer DCA Hb A1c Waived, metFORMIN (GLUCOPHAGE) 1000 MG tablet  Hypertension associated with diabetes (HCC)  Hyperlipidemia associated with type 2 diabetes mellitus (HCC) - Plan: Lipid Panel  Chronic fatigue - Plan: Anemia Profile B, TSH + free T4  Chronic anticoagulation - Plan: Anemia Profile B  Mixed stress and urge urinary incontinence - Plan: Incontinence supply  Sugar well-controlled with A1c of 5.2.  Okay to reduce metformin to 1000 mg daily.  Continue Ozempic as prescribed  Blood pressure controlled.  No changes needed  Lipid panel collected.  Will CC to Dr. Wyline Mood  She reported chronic fatigue so we will check anemia profile, thyroid levels  I have ordered got incontinent supplies and these will be sent to appropriate facility.  Order given to [employer] for faxing.  Katelyn Ip, DO [facility] [phone]

## 2023-10-26 NOTE — Telephone Encounter (Signed)
PA for tramadol submitted   [name] (Key: [id number])

## 2023-10-27 LAB — LIPID PANEL
Chol/HDL Ratio: 3.1 {ratio} (ref 0.0–4.4)
Cholesterol, Total: 118 mg/dL (ref 100–199)
HDL: 38 mg/dL — ABNORMAL LOW (ref >39)
LDL Chol Calc (NIH): 55 mg/dL (ref 0–99)
Triglycerides: 145 mg/dL (ref 0–149)
VLDL Cholesterol Cal: 25 mg/dL (ref 5–40)

## 2023-10-27 LAB — ANEMIA PROFILE B
Basophils Absolute: 0 10*3/uL (ref 0.0–0.2)
Basos: 0 %
EOS (ABSOLUTE): 0.1 10*3/uL (ref 0.0–0.4)
Eos: 1 %
Ferritin: 52 ng/mL (ref 15–150)
Folate: 5.9 ng/mL (ref >3.0)
Hematocrit: 40.3 % (ref 34.0–46.6)
Hemoglobin: 13.6 g/dL (ref 11.1–15.9)
Immature Grans (Abs): 0.1 10*3/uL (ref 0.0–0.1)
Immature Granulocytes: 1 %
Iron Saturation: 17 % (ref 15–55)
Iron: 59 ug/dL (ref 27–159)
Lymphocytes Absolute: 2.3 10*3/uL (ref 0.7–3.1)
Lymphs: 21 %
MCH: 30.6 pg (ref 26.6–33.0)
MCHC: 33.7 g/dL (ref 31.5–35.7)
MCV: 91 fL (ref 79–97)
Monocytes Absolute: 0.6 10*3/uL (ref 0.1–0.9)
Monocytes: 6 %
Neutrophils Absolute: 7.7 10*3/uL — ABNORMAL HIGH (ref 1.4–7.0)
Neutrophils: 71 %
Platelets: 298 10*3/uL (ref 150–450)
RBC: 4.44 x10E6/uL (ref 3.77–5.28)
RDW: 12.7 % (ref 11.7–15.4)
Retic Ct Pct: 2.2 % (ref 0.6–2.6)
Total Iron Binding Capacity: 347 ug/dL (ref 250–450)
UIBC: 288 ug/dL (ref 131–425)
Vitamin B-12: 264 pg/mL (ref 232–1245)
WBC: 10.8 10*3/uL (ref 3.4–10.8)

## 2023-10-27 LAB — TSH+FREE T4
Free T4: 1.19 ng/dL (ref 0.82–1.77)
TSH: 0.757 u[IU]/mL (ref 0.450–4.500)

## 2023-10-27 NOTE — Telephone Encounter (Signed)
FYI  Copied from CRM [id number]. Topic: Medical Record Request - Provider/Facility Request >> Oct 27, 2023  8:37 AM Bobbye Morton wrote: Reason for CRM: Carollee Herter called to state she was faxing over some papers for pt that would need to be filled out and signed and sent back.

## 2023-10-28 ENCOUNTER — Encounter: Payer: Self-pay | Admitting: Family Medicine

## 2023-10-28 DIAGNOSIS — N3946 Mixed incontinence: Secondary | ICD-10-CM | POA: Diagnosis not present

## 2023-10-28 DIAGNOSIS — E1159 Type 2 diabetes mellitus with other circulatory complications: Secondary | ICD-10-CM | POA: Diagnosis not present

## 2023-10-28 DIAGNOSIS — E1169 Type 2 diabetes mellitus with other specified complication: Secondary | ICD-10-CM | POA: Diagnosis not present

## 2023-10-31 ENCOUNTER — Ambulatory Visit: Payer: Medicaid Other | Admitting: Physical Medicine and Rehabilitation

## 2023-11-02 ENCOUNTER — Encounter
Payer: Medicaid Other | Attending: Physical Medicine and Rehabilitation | Admitting: Physical Medicine and Rehabilitation

## 2023-11-02 ENCOUNTER — Encounter: Payer: Self-pay | Admitting: Physical Medicine and Rehabilitation

## 2023-11-02 VITALS — BP 96/65 | HR 82 | Ht 70.0 in | Wt 371.0 lb

## 2023-11-02 DIAGNOSIS — G894 Chronic pain syndrome: Secondary | ICD-10-CM | POA: Diagnosis not present

## 2023-11-02 DIAGNOSIS — G8929 Other chronic pain: Secondary | ICD-10-CM | POA: Diagnosis not present

## 2023-11-02 DIAGNOSIS — M7918 Myalgia, other site: Secondary | ICD-10-CM | POA: Insufficient documentation

## 2023-11-02 DIAGNOSIS — M545 Low back pain, unspecified: Secondary | ICD-10-CM | POA: Insufficient documentation

## 2023-11-02 DIAGNOSIS — M797 Fibromyalgia: Secondary | ICD-10-CM | POA: Insufficient documentation

## 2023-11-02 MED ORDER — LIDOCAINE HCL 1 % IJ SOLN
9.0000 mL | Freq: Once | INTRAMUSCULAR | Status: AC
  Administered 2023-11-02: 9 mL

## 2023-11-02 NOTE — Progress Notes (Signed)
 Pt is a 43 yr old female with BMI of 56- weight 390 lbs- down to 371 lbs today, DM, GAD, HTN,  and  CHF- chronic pain- Appears to have L T8 foraminal thoracic stenosis.  As well as fibromyalgia and myofascial pain syndrome.   Here for f/u on Chronic pain and trigger point injection       Pain mostly stress Husband had a massive MI Xmas eve-   Has been really stressful- trying to get resting Heart rate below 100.  And BP coming down slightly.   The weather has been so fluctuating, so hurting more as it' slabile.   Back Camino locked up.  Feels like it needs to pop.  Tried stretching and twisting- cannot get it to pop. Even yoga ball.   Took off 1 dose of metformin - her A1c is 5.2   Plan: Patient here for trigger point injections for  Consent done and on chart.  Cleaned areas with alcohol and injected using a 27 gauge 1.5 inch needle  Injected 9cc- wasted 1cc-  Using 1% Lidocaine  with no EPI  Upper traps B/L  Levators B/L  Posterior scalenes Middle scalenes B/L  Splenius Capitus- B/L  Pectoralis Major- B/L  Rhomboids Infraspinatus Teres Major/minor Thoracic paraspinals- B/L x2 Lumbar paraspinals- B/L x2 Other injections-    Patient's level of pain prior was 7-8/10 Current level of pain after injections is about the same  There was no bleeding or complications.  Patient was advised to drink a lot of water  on day after injections to flush system Will have increased soreness for 12-48 hours after injections.  Can use Lidocaine  patches the day AFTER injections Can use theracane on day of injections in places didn't inject Can use heating pad 4-6 hours AFTER injections  2. Referral to Dr Odis Mace- OMT- osteopathic manipulation-  Will place referral  3. Con't Duloxetine  60 mg daily-  Doesn't need refill today.    4. Con't Prozac  20 mg daily   5. Con't Lyrica  150 mg TID- doesn't need refills.    6. Con't Tramadol - has refills-    7. Is due for UDS-  toxassure- written for  8. F/U in 2 months- trP injections and f/u on pain.    I spent a total of 29   minutes on total care today- >50% coordination of care- due to 8 minutes on injections and rest discussing pain- referral for OMT-

## 2023-11-02 NOTE — Patient Instructions (Signed)
 Plan: Patient here for trigger point injections for  Consent done and on chart.  Cleaned areas with alcohol and injected using a 27 gauge 1.5 inch needle  Injected 9cc- wasted 1cc-  Using 1% Lidocaine  with no EPI  Upper traps B/L  Levators B/L  Posterior scalenes Middle scalenes B/L  Splenius Capitus- B/L  Pectoralis Major- B/L  Rhomboids Infraspinatus Teres Major/minor Thoracic paraspinals- B/L x2 Lumbar paraspinals- B/L x2 Other injections-    Patient's level of pain prior was 7-8/10 Current level of pain after injections is about the same  There was no bleeding or complications.  Patient was advised to drink a lot of water  on day after injections to flush system Will have increased soreness for 12-48 hours after injections.  Can use Lidocaine  patches the day AFTER injections Can use theracane on day of injections in places didn't inject Can use heating pad 4-6 hours AFTER injections  2. Referral to Dr Odis Mace- OMT- osteopathic manipulation-  Will place referral  3. Con't Duloxetine  60 mg daily-  Doesn't need refill today.    4. Con't Prozac  20 mg daily   5. Con't Lyrica  150 mg TID- doesn't need refills.    6. Con't Tramadol - has refills-    7. Is due for UDS- toxassure- written for  8. F/U in 2 months- trP injections and f/u on pain.
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
## 2023-11-04 ENCOUNTER — Ambulatory Visit: Payer: Medicaid Other | Admitting: Sports Medicine

## 2023-11-04 VITALS — BP 126/70 | HR 97 | Ht 70.0 in | Wt 372.0 lb

## 2023-11-04 DIAGNOSIS — M9902 Segmental and somatic dysfunction of thoracic region: Secondary | ICD-10-CM

## 2023-11-04 DIAGNOSIS — M47814 Spondylosis without myelopathy or radiculopathy, thoracic region: Secondary | ICD-10-CM | POA: Diagnosis not present

## 2023-11-04 DIAGNOSIS — M546 Pain in thoracic spine: Secondary | ICD-10-CM | POA: Diagnosis not present

## 2023-11-04 DIAGNOSIS — G8929 Other chronic pain: Secondary | ICD-10-CM | POA: Diagnosis not present

## 2023-11-04 DIAGNOSIS — M9903 Segmental and somatic dysfunction of lumbar region: Secondary | ICD-10-CM

## 2023-11-04 DIAGNOSIS — M47816 Spondylosis without myelopathy or radiculopathy, lumbar region: Secondary | ICD-10-CM | POA: Diagnosis not present

## 2023-11-04 DIAGNOSIS — M545 Low back pain, unspecified: Secondary | ICD-10-CM

## 2023-11-04 DIAGNOSIS — M9905 Segmental and somatic dysfunction of pelvic region: Secondary | ICD-10-CM

## 2023-11-04 NOTE — Patient Instructions (Addendum)
 Follow up in 4 weeks. MSK

## 2023-11-05 LAB — TOXASSURE SELECT,+ANTIDEPR,UR

## 2023-11-12 ENCOUNTER — Other Ambulatory Visit: Payer: Self-pay | Admitting: Family Medicine

## 2023-11-12 DIAGNOSIS — E7800 Pure hypercholesterolemia, unspecified: Secondary | ICD-10-CM

## 2023-11-12 DIAGNOSIS — I214 Non-ST elevation (NSTEMI) myocardial infarction: Secondary | ICD-10-CM

## 2023-11-14 ENCOUNTER — Other Ambulatory Visit: Payer: Self-pay | Admitting: Cardiology

## 2023-11-14 ENCOUNTER — Other Ambulatory Visit: Payer: Self-pay | Admitting: Family Medicine

## 2023-11-14 DIAGNOSIS — E1169 Type 2 diabetes mellitus with other specified complication: Secondary | ICD-10-CM

## 2023-11-14 DIAGNOSIS — F411 Generalized anxiety disorder: Secondary | ICD-10-CM

## 2023-11-14 DIAGNOSIS — F418 Other specified anxiety disorders: Secondary | ICD-10-CM

## 2023-11-28 DIAGNOSIS — N3946 Mixed incontinence: Secondary | ICD-10-CM | POA: Diagnosis not present

## 2023-11-28 DIAGNOSIS — E1159 Type 2 diabetes mellitus with other circulatory complications: Secondary | ICD-10-CM | POA: Diagnosis not present

## 2023-11-28 DIAGNOSIS — E1169 Type 2 diabetes mellitus with other specified complication: Secondary | ICD-10-CM | POA: Diagnosis not present
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
## 2023-12-05 ENCOUNTER — Ambulatory Visit: Payer: Medicaid Other | Admitting: Sports Medicine

## 2023-12-05 ENCOUNTER — Other Ambulatory Visit: Payer: Self-pay | Admitting: *Deleted

## 2023-12-05 DIAGNOSIS — E1169 Type 2 diabetes mellitus with other specified complication: Secondary | ICD-10-CM

## 2023-12-05 MED ORDER — ROSUVASTATIN CALCIUM 40 MG PO TABS: 40.0000 mg | ORAL_TABLET | Freq: Every day | ORAL | 0 refills | Status: DC

## 2023-12-06 ENCOUNTER — Encounter: Payer: Self-pay | Admitting: Sports Medicine

## 2023-12-07 ENCOUNTER — Ambulatory Visit: Admitting: Sports Medicine

## 2023-12-07 VITALS — Ht 70.0 in | Wt 368.0 lb

## 2023-12-07 DIAGNOSIS — M47814 Spondylosis without myelopathy or radiculopathy, thoracic region: Secondary | ICD-10-CM | POA: Diagnosis not present

## 2023-12-07 DIAGNOSIS — M9903 Segmental and somatic dysfunction of lumbar region: Secondary | ICD-10-CM

## 2023-12-07 DIAGNOSIS — M546 Pain in thoracic spine: Secondary | ICD-10-CM | POA: Diagnosis not present

## 2023-12-07 DIAGNOSIS — M9902 Segmental and somatic dysfunction of thoracic region: Secondary | ICD-10-CM

## 2023-12-07 DIAGNOSIS — G8929 Other chronic pain: Secondary | ICD-10-CM

## 2023-12-07 DIAGNOSIS — M545 Low back pain, unspecified: Secondary | ICD-10-CM

## 2023-12-07 DIAGNOSIS — M47816 Spondylosis without myelopathy or radiculopathy, lumbar region: Secondary | ICD-10-CM | POA: Diagnosis not present

## 2023-12-07 DIAGNOSIS — M9905 Segmental and somatic dysfunction of pelvic region: Secondary | ICD-10-CM
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
## 2023-12-12 ENCOUNTER — Other Ambulatory Visit: Payer: Self-pay | Admitting: Physical Medicine and Rehabilitation

## 2023-12-12 ENCOUNTER — Other Ambulatory Visit: Payer: Self-pay | Admitting: Cardiology

## 2023-12-12 ENCOUNTER — Other Ambulatory Visit: Payer: Self-pay | Admitting: Family Medicine

## 2023-12-12 DIAGNOSIS — E1169 Type 2 diabetes mellitus with other specified complication: Secondary | ICD-10-CM

## 2023-12-16 ENCOUNTER — Inpatient Hospital Stay: Payer: Medicaid Other | Attending: Hematology

## 2023-12-16 DIAGNOSIS — D72829 Elevated white blood cell count, unspecified: Secondary | ICD-10-CM | POA: Insufficient documentation

## 2023-12-16 LAB — CBC WITH DIFFERENTIAL/PLATELET
Abs Immature Granulocytes: 0.05 10*3/uL (ref 0.00–0.07)
Basophils Absolute: 0 10*3/uL (ref 0.0–0.1)
Basophils Relative: 0 %
Eosinophils Absolute: 0.1 10*3/uL (ref 0.0–0.5)
Eosinophils Relative: 1 %
HCT: 40.7 % (ref 36.0–46.0)
Hemoglobin: 13.5 g/dL (ref 12.0–15.0)
Immature Granulocytes: 1 %
Lymphocytes Relative: 24 %
Lymphs Abs: 2.4 10*3/uL (ref 0.7–4.0)
MCH: 30.5 pg (ref 26.0–34.0)
MCHC: 33.2 g/dL (ref 30.0–36.0)
MCV: 92.1 fL (ref 80.0–100.0)
Monocytes Absolute: 0.6 10*3/uL (ref 0.1–1.0)
Monocytes Relative: 6 %
Neutro Abs: 6.9 10*3/uL (ref 1.7–7.7)
Neutrophils Relative %: 68 %
Platelets: 291 10*3/uL (ref 150–400)
RBC: 4.42 MIL/uL (ref 3.87–5.11)
RDW: 12.7 % (ref 11.5–15.5)
WBC: 10.2 10*3/uL (ref 4.0–10.5)
nRBC: 0 % (ref 0.0–0.2)

## 2023-12-16 LAB — LACTATE DEHYDROGENASE: LDH: 117 U/L (ref 98–192)

## 2023-12-16 LAB — C-REACTIVE PROTEIN: CRP: 0.5 mg/dL (ref <1.0)

## 2023-12-16 LAB — SEDIMENTATION RATE: Sed Rate: 27 mm/h — ABNORMAL HIGH (ref 0–22)

## 2023-12-23 ENCOUNTER — Inpatient Hospital Stay: Payer: Medicaid Other | Admitting: Oncology

## 2023-12-23 NOTE — Progress Notes (Deleted)
 [facility] [address], [place] [address]   CLINIC:  Medical Oncology/Hematology  PCP:  Raliegh Ip, DO [address] [place] [address] [phone]   REASON FOR VISIT:  Follow-up for neutrophilic leukocytosis  PRIOR THERAPY: None  CURRENT THERAPY: Under workup  INTERVAL HISTORY:   [name] 43 y.o. female returns for routine follow-up of her neutrophilic leukocytosis.  She was last seen in clinic on 12/23/2022 by Rojelio Brenner, PA.  Since her last visit, she had a left heart cath due to chest pain and progressive angina on 01/18/2023.  Cath showed widely patent proximal LAD stent and ostial to optimal RI DES stent.  Culprit lesion is distal LAD subtotal occlusion involving the apical portion of the wraparound LAD.  Very long segment in the small caliber is not favorable for PCI.  Recommend medical management.  Otherwise normal LV function.  No obvious wall motion normality.  Imdur 30 mg daily was added along with her Ranexa this propranolol and amlodipine.  Patient has had kidney stones in the past and has annual renal ultrasound.  Most recent ultrasound did not reveal any abnormality.  She sees urology annually.  She met with pulmonology on 05/23/2023 for shortness of breath.  She continues Symbicort.  Symptoms appear stable.  At today's visit, she reports feeling fairly well.  No recent hospitalizations, surgeries, or changes in baseline health status.   She takes Symbicort twice daily, but no other steroid medications.  No history of connective tissue disorder or chronic inflammatory disease.  She denies any recent infections, but reportedly has UTIs once or twice each year.  She denies any B symptoms.   No new masses or lymphadenopathy.  She continues to smoke 0.25 PPD cigarettes, but is vaping instead.  She has 75% energy and 75% appetite. She endorses that she is maintaining a stable weight.  ASSESSMENT & PLAN:  1.  Leukocytosis, neutrophil  predominant - Intermittent leukocytosis since at least 2013, persistent since January 2022.  Neutrophil predominant. - No recurrent infections.  Occasional UTI once or twice per year. - No fevers or night sweats.  Lost 65 pounds in the last 1 year while being on Ozempic. - She uses Symbicort inhaler twice daily, no other steroids   - CTAP (05/18/2020): Spleen normal.  Fatty liver seen. - She smokes 0.5 PPD cigarettes x 30 years. - down to 0.25 PPD, but vaping instead  - She is morbidly obese (BMI 52.62) - Hematology workup (11/12/2022): JAK2, CALR, MPL, Exons 12-15 negative. BCR/ABL FISH negative. Elevated ESR 38.  Normal CRP, RF, ANA, LDH.  2.  Social/family history: - She lives at home with her husband.  She is currently not working and is trying to get on disability.  She used to work on cars, Arboriculturist. - Current active smoker, half pack per day for 30 years. - No family history of leukemia. - Maternal grandmother and maternal great grandmother had breast cancer.  Mother had breast cancer.  Father had throat cancer.  Paternal grandfather had lung cancer.  PLAN: 1. Leukocytosis, unspecified type (Primary) - Most recent CBC/differential (11/12/2022): WBC 15.2/ANC 11.5, otherwise normal CBC. - No lymphadenopathy palpated on exam.   - DIFFERENTIAL DIAGNOSIS favors reactive leukocytosis in the setting of tobacco use and morbid obesity - PLAN: CBC/differential only in 6 months.  Labs (CBC/D, ESR, CRP, LDH) and office visit in 1 year.  PLAN SUMMARY: >> Labs only in 6 months = CBC/differential >> Labs  in 1 year = CBC/differential, ESR, CRP, LDH >> OFFICE visit in 1 year     REVIEW OF SYSTEMS:   Review of Systems - Oncology   PHYSICAL EXAM:  ECOG PERFORMANCE STATUS: 0 - Asymptomatic  There were no vitals filed for this visit. There were no vitals filed for this visit. Physical Exam PAST MEDICAL/SURGICAL HISTORY:  Past Medical History:  Diagnosis Date   Allergy     Anemia    Anginal pain (HCC)    Anxiety    Arthritis    CHF (congestive heart failure) (HCC)    Chronic asthmatic bronchitis (HCC) 08/17/2021   Chronic kidney disease    history of kidney stones   Coronary artery disease 09/2011   a.s/p BMS to LAD and angioplasty of RI in 09/2011 b. patent stent by cath in 02/2012, 12/2013, and 02/2016 with most recent showing 30-40% dLAD and 30-40% RCA stenosis   Depression    Diabetes mellitus    Dyspnea    GERD (gastroesophageal reflux disease)    History of kidney stones    Hyperlipidemia    Hypertension    Midsternal chest pain 03/03/2012   Migraines    Morbid obesity (HCC)    Myocardial infarct (HCC) 09/2011   Myocardial infarction (HCC) 02/2016   Neuromuscular disorder (HCC)    Palpitations 02/17/2012   Ureteral stone with hydronephrosis    Urinary tract obstruction due to kidney stone 02/15/2017   Past Surgical History:  Procedure Laterality Date   BIOPSY  10/27/2020   Procedure: BIOPSY;  Surgeon: Corbin Ade, MD;  Location: [facility];  Service: Endoscopy;;   CARDIAC CATHETERIZATION  10/08/2011   LAD: 95% mid, Ramus: 95% ostial   CARPAL TUNNEL RELEASE     CESAREAN SECTION     CHOLECYSTECTOMY     CORONARY ANGIOPLASTY WITH STENT PLACEMENT  10/08/2011   LAD: BMS, Ramus: cutting balloon angioplasty   CORONARY STENT INTERVENTION N/A 01/14/2020   Procedure: CORONARY STENT INTERVENTION;  Surgeon: Marykay Lex, MD;  Location: [facility] IN[facility]AS[facility] CV LAB;  Service: Cardiovascular;  Laterality: N/A;   CYSTOSCOPY WITH RETROGRADE PYELOGRAM, URETEROSCOPY AND STENT PLACEMENT Left 02/16/2017   Procedure: CYSTOSCOPY WITH RETROGRADE PYELOGRAM, URETEROSCOPY AND STENT PLACEMENT;  Surgeon: Ihor Gully, MD;  Location: [facility];  Service: Urology;  Laterality: Left;   CYSTOSCOPY WITH RETROGRADE PYELOGRAM, URETEROSCOPY AND STENT PLACEMENT Left 03/28/2017   Procedure: LEFT STENT REMOVAL LEFT RETROGRADE PYELOGRAM LEFT URETEROSCOPY   LASER LITHOTRIPSY   AND  STENT REPLACEMENT;  Surgeon: Malen Gauze, MD;  Location: [facility];  Service: Urology;  Laterality: Left;   CYSTOSCOPY WITH RETROGRADE PYELOGRAM, URETEROSCOPY AND STENT PLACEMENT Left 04/13/2022   Procedure: CYSTOSCOPY WITH RETROGRADE PYELOGRAM, URETEROSCOPY AND STENT PLACEMENT, BASKET EXTRACTION;  Surgeon: Malen Gauze, MD;  Location: [facility];  Service: Urology;  Laterality: Left;   ESOPHAGOGASTRODUODENOSCOPY (EGD) WITH PROPOFOL N/A 10/27/2020   Rourk: mild erosive reflux esophagitis, h.pylori gastritis   LEFT HEART CATH AND CORONARY ANGIOGRAPHY N/A 08/15/2018   Procedure: LEFT HEART CATH AND CORONARY ANGIOGRAPHY;  Surgeon: Swaziland, Peter M, MD;  Location: [facility] INVAS[facility] CV LAB;  Service: Cardiovascular;  Laterality: N/A;   LEFT HEART CATH AND CORONARY ANGIOGRAPHY N/A 01/14/2020   Procedure: LEFT HEART CATH AND CORONARY ANGIOGRAPHY;  Surgeon: Marykay Lex, MD;  Location: [facility] INVASIVE CV LAB;  Service: Cardiovascular;  Laterality: N/A;   LEFT HEART CATH AND CORONARY ANGIOGRAPHY N/A 01/18/2023   Procedure: LEFT HEART CATH AND CORONARY ANGIOGRAPHY;  Surgeon: Bryan Lemma  W, MD;  Location: [facility] CV [facility];  Service: Cardiovascular;  Laterality: N/A;   LEFT HEART CATHETERIZATION WITH CORONARY ANGIOGRAM N/A 10/08/2011   Procedure: LEFT HEART CATHETERIZATION WITH CORONARY ANGIOGRAM;  Surgeon: Peter M Swaziland, MD;  Location: [facility] CATH LAB;  Service: Cardiovascular;  Laterality: N/A;   LEFT HEART CATHETERIZATION WITH CORONARY ANGIOGRAM N/A 03/02/2012   Procedure: LEFT HEART CATHETERIZATION WITH CORONARY ANGIOGRAM;  Surgeon: Kathleene Hazel, MD;  Location: [facility] CATH LAB;  Service: Cardiovascular;  Laterality: N/A;   LEFT HEART CATHETERIZATION WITH CORONARY ANGIOGRAM N/A 01/04/2014   Procedure: LEFT HEART CATHETERIZATION WITH CORONARY ANGIOGRAM;  Surgeon: Lennette Bihari, MD;  Location: [facility] CATH LAB;  Service: Cardiovascular;  Laterality: N/A;   right hand pinky surgery      SOCIAL  HISTORY:  Social History   Socioeconomic History   Marital status: Married    Spouse name: Ree Kida   Number of children: 2   Years of education: 10   Highest education level: Not on file  Occupational History   Not on file  Tobacco Use   Smoking status: Some Days    Current packs/day: 0.50    Average packs/day: 0.5 packs/day for 33.8 years (16.9 ttl pk-yrs)    Types: Cigarettes    Start date: 03/18/1990   Smokeless tobacco: Never   Tobacco comments:    Vapes currently   Vaping Use   Vaping status: Some Days   Substances: Nicotine  Substance and Sexual Activity   Alcohol use: Not Currently    Alcohol/week: 0.0 standard drinks of alcohol    Comment: maybe once a year   Drug use: No   Sexual activity: Yes    Birth control/protection: None    Comment: PCOS  Other Topics Concern   Not on file  Social History Narrative   Unemployed   Applied for disability   Leisure "take care of house and kids"   Walks when able      She is working to get on disability. She previously worked on Production designer, theatre/television/film.      Social Drivers of Corporate investment banker Strain: Low Risk  (10/28/2017)   Overall Financial Resource Strain (CARDIA)    Difficulty of Paying Living Expenses: Not hard at all  Food Insecurity: No Food Insecurity (11/12/2022)   Hunger Vital Sign    Worried About Running Out of Food in the Last Year: Never true    Ran Out of Food in the Last Year: Never true  Transportation Needs: No Transportation Needs (11/12/2022)   PRAPARE - Administrator, Civil Service (Medical): No    Lack of Transportation (Non-Medical): No  Physical Activity: Inactive (10/28/2017)   Exercise Vital Sign    Days of Exercise per Week: 0 days    Minutes of Exercise per Session: 0 min  Stress: Stress Concern Present (10/28/2017)   [employer] of Occupational Health - Occupational Stress Questionnaire    Feeling of Stress : To some extent  Social Connections: Somewhat Isolated  (10/28/2017)   Social Connection and Isolation Panel [NHANES]    Frequency of Communication with Friends and Family: More than three times a week    Frequency of Social Gatherings with Friends and Family: More than three times a week    Attends Religious Services: Never    Database administrator or Organizations: No    Attends Banker Meetings: Never    Marital Status: Married  Catering manager Violence: Not At  Risk (11/12/2022)   Humiliation, Afraid, Rape, and Kick questionnaire    Fear of Current or Ex-Partner: No    Emotionally Abused: No    Physically Abused: No    Sexually Abused: No    FAMILY HISTORY:  Family History  Problem Relation Age of Onset   Heart murmur Father    Diabetes Father    Hypertension Father    Hyperlipidemia Father    Cancer Father        throat   Diabetes Mother    Cancer Mother        breast.uterine, ovarian   Asthma Son    Appendicitis Son    Hernia Son    Mental illness Son        Bipolar, personality d/o   Stroke Maternal Grandmother    Cancer Maternal Grandmother        breast   Cancer Paternal Grandfather        lung   Colon cancer Neg Hx     CURRENT MEDICATIONS:  Outpatient Encounter Medications as of 12/23/2023  Medication Sig Note   Accu-Chek Softclix Lancets lancets CHECK BOOD SUGAR FOUR TIMES DAILY Dx E11.9    acetaminophen (TYLENOL) 500 MG tablet Take 1,000 mg by mouth every 6 (six) hours as needed for moderate pain.    albuterol (VENTOLIN HFA) 108 (90 Base) MCG/ACT inhaler Inhale 2 puffs into the lungs every 4 (four) hours as needed for wheezing or shortness of breath.    amLODipine (NORVASC) 5 MG tablet TAKE 1 TABLET BY MOUTH DAILY    aspirin 81 MG chewable tablet Chew 81 mg by mouth daily.    bisoprolol (ZEBETA) 5 MG tablet TAKE 1 TABLET BY MOUTH TWICE DAILY    blood glucose meter kit and supplies Dispense based on patient and insurance preference. Use up to four times daily as directed. (FOR ICD-10:  E11.22). Check  BG daily    Blood Glucose Monitoring Suppl (ACCU-CHEK GUIDE) w/Device KIT Test BS 4 times daily Dx E11.9    budesonide-formoterol (SYMBICORT) 160-4.5 MCG/ACT inhaler INHALE TWO PUFFS first thing IN THE MORNING AND THEN INHALE another 2 PUFFS about 12 HOURS LATER (Patient taking differently: 1 puff 2 (two) times daily.)    busPIRone (BUSPAR) 10 MG tablet TAKE 1 TABLET BY MOUTH TWICE DAILY    clopidogrel (PLAVIX) 75 MG tablet TAKE 1 TABLET BY MOUTH EVERY DAY    DULoxetine (CYMBALTA) 60 MG capsule Take 1 capsule (60 mg total) by mouth at bedtime.    Evolocumab (REPATHA SURECLICK) 140 MG/ML SOAJ INJECT 140 MG UNDER THE SKIN EVERY 14 DAYS    ezetimibe (ZETIA) 10 MG tablet TAKE 1 TABLET BY MOUTH EVERY DAY    fenofibrate (TRICOR) 48 MG tablet TAKE 1 TABLET BY MOUTH DAILY    FLUoxetine (PROZAC) 20 MG capsule TAKE ONE CAPSULE BY MOUTH EVERY DAY GIVE WITH CYMBALTA DUE TO NERVE PAIN AND ANXIETY/DEPRESSION    glucose blood (ACCU-CHEK GUIDE TEST) test strip CHECK BOOD SUGAR FOUR TIMES DAILY Dx E11.9    losartan (COZAAR) 25 MG tablet TAKE 1 TABLET BY MOUTH DAILY    medroxyPROGESTERone (PROVERA) 5 MG tablet TAKE 1 TABLET BY MOUTH DAILY    metFORMIN (GLUCOPHAGE) 1000 MG tablet Take 1 tablet (1,000 mg total) by mouth daily with breakfast.    nitroGLYCERIN (NITROSTAT) 0.4 MG SL tablet DISSOLVE 1 TABLET UNDER THE TONGUE EVERY 5 MINUTES AS NEEDED FOR CHEST PAIN. DO NOT EXCEED A TOTAL OF 3 DOSES IN 15 MINUTES.  OZEMPIC, 1 MG/DOSE, 4 MG/3ML SOPN INJECT 1 MG WEEKLY (TO REPLACE 0.5MG  OZEMPIC)    potassium chloride SA (KLOR-CON M) 20 MEQ tablet TAKE 1 TABLET BY MOUTH EVERY DAY AS NEEDED (when taking torsemide)    pregabalin (LYRICA) 150 MG capsule Take 1 capsule (150 mg total) by mouth 3 (three) times daily.    ranolazine (RANEXA) 1000 MG SR tablet TAKE 1 TABLET BY MOUTH TWICE DAILY    rosuvastatin (CRESTOR) 40 MG tablet Take 1 tablet (40 mg total) by mouth daily.    torsemide (DEMADEX) 20 MG tablet TAKE 2 TABLETS BY  MOUTH DAILY AS NEEDED FOR swelling    traMADol (ULTRAM) 50 MG tablet Take 2 tablets (100 mg total) by mouth 2 (two) times daily. 11/02/2023: #120 on 10/26/23 #110 today LD 11/02/23   VASCEPA 1 g capsule TAKE TWO CAPSULES BY MOUTH TWICE DAILY    No facility-administered encounter medications on file as of 12/23/2023.    ALLERGIES:  Allergies  Allergen Reactions   Bee Venom Anaphylaxis and Swelling   Latex Itching   Misc. Sulfonamide Containing Compounds Nausea And Vomiting   Sulfa Drugs Cross Reactors Nausea And Vomiting    LABORATORY DATA:  I have reviewed the labs as listed.  CBC    Component Value Date/Time   WBC 10.2 12/16/2023 1120   RBC 4.42 12/16/2023 1120   HGB 13.5 12/16/2023 1120   HGB 13.6 10/26/2023 1205   HCT 40.7 12/16/2023 1120   HCT 40.3 10/26/2023 1205   PLT 291 12/16/2023 1120   PLT 298 10/26/2023 1205   MCV 92.1 12/16/2023 1120   MCV 91 10/26/2023 1205   MCH 30.5 12/16/2023 1120   MCHC 33.2 12/16/2023 1120   RDW 12.7 12/16/2023 1120   RDW 12.7 10/26/2023 1205   LYMPHSABS 2.4 12/16/2023 1120   LYMPHSABS 2.3 10/26/2023 1205   MONOABS 0.6 12/16/2023 1120   EOSABS 0.1 12/16/2023 1120   EOSABS 0.1 10/26/2023 1205   BASOSABS 0.0 12/16/2023 1120   BASOSABS 0.0 10/26/2023 1205      Latest Ref Rng & Units 07/25/2023    8:19 AM 01/13/2023    9:26 AM 10/20/2022    8:21 AM  CMP  Glucose 70 - 99 mg/dL 829  562  130   BUN 6 - 24 mg/dL 12  9  11    Creatinine 0.57 - 1.00 mg/dL 8.65  7.84  6.96   Sodium 134 - 144 mmol/L 137  132  136   Potassium 3.5 - 5.2 mmol/L 4.2  3.8  4.2   Chloride 96 - 106 mmol/L 101  101  103   CO2 20 - 29 mmol/L 21  22  18    Calcium 8.7 - 10.2 mg/dL 9.0  8.8  9.1   Total Protein 6.0 - 8.5 g/dL 6.8   6.6   Total Bilirubin 0.0 - 1.2 mg/dL <2.9   0.3   Alkaline Phos 44 - 121 IU/L 63   69   AST 0 - 40 IU/L 18   13   ALT 0 - 32 IU/L 16   10     DIAGNOSTIC IMAGING:  I have independently reviewed the relevant imaging and discussed with the  patient.   WRAP UP:  All questions were answered. The patient knows to call the clinic with any problems, questions or concerns.  Medical decision making: Low  Time spent on visit: I spent 15 minutes counseling the patient face to face. The total time spent in the appointment was  22 minutes and more than 50% was on counseling.  Mauro Kaufmann, NP  12/23/23 9:00 AM

## 2023-12-26 ENCOUNTER — Other Ambulatory Visit: Payer: Self-pay | Admitting: Physical Medicine and Rehabilitation

## 2023-12-26 DIAGNOSIS — E1140 Type 2 diabetes mellitus with diabetic neuropathy, unspecified: Secondary | ICD-10-CM

## 2023-12-27 NOTE — Progress Notes (Unsigned)
 [facility] [address], [place] [address]   CLINIC:  Medical Oncology/Hematology  PCP:  Raliegh Ip, DO [address] [place] [address] [phone]   REASON FOR VISIT:  Follow-up for neutrophilic leukocytosis  PRIOR THERAPY: None  CURRENT THERAPY: Under workup  INTERVAL HISTORY:   [name] 43 y.o. female returns for routine follow-up of her neutrophilic leukocytosis.  She was last seen in clinic on 12/23/2022 by Rojelio Brenner, PA.  Since her last visit, she had a left heart cath due to chest pain and progressive angina on 01/18/2023.  Cath showed widely patent proximal LAD stent and ostial to optimal RI DES stent.  Culprit lesion is distal LAD subtotal occlusion involving the apical portion of the wraparound LAD.  Very long segment in the small caliber is not favorable for PCI.  Recommend medical management.  Otherwise normal LV function.  No obvious wall motion normality.  Imdur 30 mg daily was added along with her Ranexa, propranolol and amlodipine.  Patient has had kidney stones in the past and has annual renal ultrasound.  Most recent ultrasound did not reveal any abnormality.  She sees urology annually.  She met with pulmonology on 05/23/2023 for shortness of breath.  She continues Symbicort.  Symptoms appear stable.  At today's visit, she reports feeling fairly well.  No recent hospitalizations, surgeries, or changes in baseline health status.   She takes Symbicort twice daily, but no other steroid medications.   She denies any B symptoms.   No new masses or lymphadenopathy.    She continues to smoke 0.25 PPD cigarettes, but is vaping instead.  She has 75% energy and 75% appetite. She endorses that she is maintaining a stable weight.  ASSESSMENT & PLAN:  1.  Leukocytosis, neutrophil predominant - Intermittent leukocytosis since at least 2013, persistent since January 2022.  Neutrophil predominant. - No recurrent infections.  Occasional  UTI once or twice per year. - No fevers or night sweats.  Lost 65 pounds in the last 1 year while being on Ozempic. - She uses Symbicort inhaler twice daily, no other steroids   - CTAP (05/18/2020): Spleen normal.  Fatty liver seen. - She smokes 0.5 PPD cigarettes x 30 years. - down to 0.25 PPD, but vaping instead  - She is morbidly obese (BMI 52.62) - Hematology workup (11/12/2022): JAK2, CALR, MPL, Exons 12-15 negative. BCR/ABL FISH negative. Elevated ESR 38.  Normal CRP, RF, ANA, LDH.  2.  Social/family history: - She lives at home with her husband.  She is currently not working and is trying to get on disability.  She used to work on cars, Arboriculturist. - Current active smoker, half pack per day for 30 years. - No family history of leukemia. - Maternal grandmother and maternal great grandmother had breast cancer.  Mother had breast cancer.  Father had throat cancer.  Paternal grandfather had lung cancer.  PLAN: 1. Leukocytosis, unspecified type (Primary) - Most recent labs from 12/16/2023 show WBC of 10.2/ANC 6.9 with normal differential. - No lymphadenopathy palpated on exam.   - DIFFERENTIAL DIAGNOSIS favors reactive leukocytosis in the setting of tobacco use and morbid obesity - CBC/differential only in 6 months.  Labs (CBC/D, ESR, CRP, LDH) and office visit in 1 year.  PLAN SUMMARY: >> Labs only in 6 months = CBC/differential >> Labs in 1 year = CBC/differential, ESR, CRP, LDH >> OFFICE visit in 1 year     REVIEW OF SYSTEMS:  Review of Systems - Oncology   PHYSICAL EXAM:  ECOG PERFORMANCE STATUS: 0 - Asymptomatic  There were no vitals filed for this visit. There were no vitals filed for this visit. Physical Exam PAST MEDICAL/SURGICAL HISTORY:  Past Medical History:  Diagnosis Date   Allergy    Anemia    Anginal pain (HCC)    Anxiety    Arthritis    CHF (congestive heart failure) (HCC)    Chronic asthmatic bronchitis (HCC) 08/17/2021   Chronic kidney  disease    history of kidney stones   Coronary artery disease 09/2011   a.s/p BMS to LAD and angioplasty of RI in 09/2011 b. patent stent by cath in 02/2012, 12/2013, and 02/2016 with most recent showing 30-40% dLAD and 30-40% RCA stenosis   Depression    Diabetes mellitus    Dyspnea    GERD (gastroesophageal reflux disease)    History of kidney stones    Hyperlipidemia    Hypertension    Midsternal chest pain 03/03/2012   Migraines    Morbid obesity (HCC)    Myocardial infarct (HCC) 09/2011   Myocardial infarction (HCC) 02/2016   Neuromuscular disorder (HCC)    Palpitations 02/17/2012   Ureteral stone with hydronephrosis    Urinary tract obstruction due to kidney stone 02/15/2017   Past Surgical History:  Procedure Laterality Date   BIOPSY  10/27/2020   Procedure: BIOPSY;  Surgeon: Corbin Ade, MD;  Location: [facility];  Service: Endoscopy;;   CARDIAC CATHETERIZATION  10/08/2011   LAD: 95% mid, Ramus: 95% ostial   CARPAL TUNNEL RELEASE     CESAREAN SECTION     CHOLECYSTECTOMY     CORONARY ANGIOPLASTY WITH STENT PLACEMENT  10/08/2011   LAD: BMS, Ramus: cutting balloon angioplasty   CORONARY STENT INTERVENTION N/A 01/14/2020   Procedure: CORONARY STENT INTERVENTION;  Surgeon: Marykay Lex, MD;  Location: [facility] INVASIVE CV LAB;  Service: Cardiovascular;  Laterality: N/A;   CYSTOSCOPY WITH RETROGRADE PYELOGRAM, URETEROSCOPY AND STENT PLACEMENT Left 02/16/2017   Procedure: CYSTOSCOPY WITH RETROGRADE PYELOGRAM, URETEROSCOPY AND STENT PLACEMENT;  Surgeon: Ihor Gully, MD;  Location: [facility] ORS;  Service: Urology;  Laterality: Left;   CYSTOSCOPY WITH RETROGRADE PYELOGRAM, URETEROSCOPY AND STENT PLACEMENT Left 03/28/2017   Procedure: LEFT STENT REMOVAL LEFT RETROGRADE PYELOGRAM LEFT URETEROSCOPY   LASER LITHOTRIPSY  AND  STENT REPLACEMENT;  Surgeon: Malen Gauze, MD;  Location: [facility] ORS;  Service: Urology;  Laterality: Left;   CYSTOSCOPY WITH RETROGRADE PYELOGRAM,  URETEROSCOPY AND STENT PLACEMENT Left 04/13/2022   Procedure: CYSTOSCOPY WITH RETROGRADE PYELOGRAM, URETEROSCOPY AND STENT PLACEMENT, BASKET EXTRACTION;  Surgeon: Malen Gauze, MD;  Location: [facility];  Service: Urology;  Laterality: Left;   ESOPHAGOGASTRODUODENOSCOPY (EGD) WITH PROPOFOL N/A 10/27/2020   Rourk: mild erosive reflux esophagitis, h.pylori gastritis   LEFT HEART CATH AND CORONARY ANGIOGRAPHY N/A 08/15/2018   Procedure: LEFT HEART CATH AND CORONARY ANGIOGRAPHY;  Surgeon: Swaziland, Peter M, MD;  Location: [facility] INVASIVE CV LAB;  Service: Cardiovascular;  Laterality: N/A;   LEFT HEART CATH AND CORONARY ANGIOGRAPHY N/A 01/14/2020   Procedure: LEFT HEART CATH AND CORONARY ANGIOGRAPHY;  Surgeon: Marykay Lex, MD;  Location: [facility] INVASIVE CV LAB;  Service: Cardiovascular;  Laterality: N/A;   LEFT HEART CATH AND CORONARY ANGIOGRAPHY N/A 01/18/2023   Procedure: LEFT HEART CATH AND CORONARY ANGIOGRAPHY;  Surgeon: Marykay Lex, MD;  Location: [facility] INVASIVE CV LAB;  Service: Cardiovascular;  Laterality: N/A;   LEFT HEART CATHETERIZATION WITH CORONARY ANGIOGRAM N/A  10/08/2011   Procedure: LEFT HEART CATHETERIZATION WITH CORONARY ANGIOGRAM;  Surgeon: Peter M Swaziland, MD;  Location: [facility] CATH LAB;  Service: Cardiovascular;  Laterality: N/A;   LEFT HEART CATHETERIZATION WITH CORONARY ANGIOGRAM N/A 03/02/2012   Procedure: LEFT HEART CATHETERIZATION WITH CORONARY ANGIOGRAM;  Surgeon: Kathleene Hazel, MD;  Location: [facility] CATH LAB;  Service: Cardiovascular;  Laterality: N/A;   LEFT HEART CATHETERIZATION WITH CORONARY ANGIOGRAM N/A 01/04/2014   Procedure: LEFT HEART CATHETERIZATION WITH CORONARY ANGIOGRAM;  Surgeon: Lennette Bihari, MD;  Location: [facility] CATH LAB;  Service: Cardiovascular;  Laterality: N/A;   right hand pinky surgery      SOCIAL HISTORY:  Social History   Socioeconomic History   Marital status: Married    Spouse name: Ree Kida   Number of children: 2   Years of education: 10   Highest  education level: Not on file  Occupational History   Not on file  Tobacco Use   Smoking status: Some Days    Current packs/day: 0.50    Average packs/day: 0.5 packs/day for 33.8 years (16.9 ttl pk-yrs)    Types: Cigarettes    Start date: 03/18/1990   Smokeless tobacco: Never   Tobacco comments:    Vapes currently   Vaping Use   Vaping status: Some Days   Substances: Nicotine  Substance and Sexual Activity   Alcohol use: Not Currently    Alcohol/week: 0.0 standard drinks of alcohol    Comment: maybe once a year   Drug use: No   Sexual activity: Yes    Birth control/protection: None    Comment: PCOS  Other Topics Concern   Not on file  Social History Narrative   Unemployed   Applied for disability   Leisure "take care of house and kids"   Walks when able      She is working to get on disability. She previously worked on Production designer, theatre/television/film.      Social Drivers of Corporate investment banker Strain: Low Risk  (10/28/2017)   Overall Financial Resource Strain (CARDIA)    Difficulty of Paying Living Expenses: Not hard at all  Food Insecurity: No Food Insecurity (11/12/2022)   Hunger Vital Sign    Worried About Running Out of Food in the Last Year: Never true    Ran Out of Food in the Last Year: Never true  Transportation Needs: No Transportation Needs (11/12/2022)   PRAPARE - Administrator, Civil Service (Medical): No    Lack of Transportation (Non-Medical): No  Physical Activity: Inactive (10/28/2017)   Exercise Vital Sign    Days of Exercise per Week: 0 days    Minutes of Exercise per Session: 0 min  Stress: Stress Concern Present (10/28/2017)   [employer] of Occupational Health - Occupational Stress Questionnaire    Feeling of Stress : To some extent  Social Connections: Somewhat Isolated (10/28/2017)   Social Connection and Isolation Panel [NHANES]    Frequency of Communication with Friends and Family: More than three times a week    Frequency of Social  Gatherings with Friends and Family: More than three times a week    Attends Religious Services: Never    Database administrator or Organizations: No    Attends Banker Meetings: Never    Marital Status: Married  Catering manager Violence: Not At Risk (11/12/2022)   Humiliation, Afraid, Rape, and Kick questionnaire    Fear of Current or Ex-Partner: No    Emotionally  Abused: No    Physically Abused: No    Sexually Abused: No    FAMILY HISTORY:  Family History  Problem Relation Age of Onset   Heart murmur Father    Diabetes Father    Hypertension Father    Hyperlipidemia Father    Cancer Father        throat   Diabetes Mother    Cancer Mother        breast.uterine, ovarian   Asthma Son    Appendicitis Son    Hernia Son    Mental illness Son        Bipolar, personality d/o   Stroke Maternal Grandmother    Cancer Maternal Grandmother        breast   Cancer Paternal Grandfather        lung   Colon cancer Neg Hx     CURRENT MEDICATIONS:  Outpatient Encounter Medications as of 12/28/2023  Medication Sig Note   Accu-Chek Softclix Lancets lancets CHECK BOOD SUGAR FOUR TIMES DAILY Dx E11.9    acetaminophen (TYLENOL) 500 MG tablet Take 1,000 mg by mouth every 6 (six) hours as needed for moderate pain.    albuterol (VENTOLIN HFA) 108 (90 Base) MCG/ACT inhaler Inhale 2 puffs into the lungs every 4 (four) hours as needed for wheezing or shortness of breath.    amLODipine (NORVASC) 5 MG tablet TAKE 1 TABLET BY MOUTH DAILY    aspirin 81 MG chewable tablet Chew 81 mg by mouth daily.    bisoprolol (ZEBETA) 5 MG tablet TAKE 1 TABLET BY MOUTH TWICE DAILY    blood glucose meter kit and supplies Dispense based on patient and insurance preference. Use up to four times daily as directed. (FOR ICD-10:  E11.22). Check BG daily    Blood Glucose Monitoring Suppl (ACCU-CHEK GUIDE) w/Device KIT Test BS 4 times daily Dx E11.9    budesonide-formoterol (SYMBICORT) 160-4.5 MCG/ACT inhaler  INHALE TWO PUFFS first thing IN THE MORNING AND THEN INHALE another 2 PUFFS about 12 HOURS LATER (Patient taking differently: 1 puff 2 (two) times daily.)    busPIRone (BUSPAR) 10 MG tablet TAKE 1 TABLET BY MOUTH TWICE DAILY    clopidogrel (PLAVIX) 75 MG tablet TAKE 1 TABLET BY MOUTH EVERY DAY    DULoxetine (CYMBALTA) 60 MG capsule Take 1 capsule (60 mg total) by mouth at bedtime.    Evolocumab (REPATHA SURECLICK) 140 MG/ML SOAJ INJECT 140 MG UNDER THE SKIN EVERY 14 DAYS    ezetimibe (ZETIA) 10 MG tablet TAKE 1 TABLET BY MOUTH EVERY DAY    fenofibrate (TRICOR) 48 MG tablet TAKE 1 TABLET BY MOUTH DAILY    FLUoxetine (PROZAC) 20 MG capsule TAKE ONE CAPSULE BY MOUTH EVERY DAY GIVE WITH CYMBALTA DUE TO NERVE PAIN AND ANXIETY/DEPRESSION    glucose blood (ACCU-CHEK GUIDE TEST) test strip CHECK BOOD SUGAR FOUR TIMES DAILY Dx E11.9    losartan (COZAAR) 25 MG tablet TAKE 1 TABLET BY MOUTH DAILY    medroxyPROGESTERone (PROVERA) 5 MG tablet TAKE 1 TABLET BY MOUTH DAILY    metFORMIN (GLUCOPHAGE) 1000 MG tablet Take 1 tablet (1,000 mg total) by mouth daily with breakfast.    nitroGLYCERIN (NITROSTAT) 0.4 MG SL tablet DISSOLVE 1 TABLET UNDER THE TONGUE EVERY 5 MINUTES AS NEEDED FOR CHEST PAIN. DO NOT EXCEED A TOTAL OF 3 DOSES IN 15 MINUTES.    OZEMPIC, 1 MG/DOSE, 4 MG/3ML SOPN INJECT 1 MG WEEKLY (TO REPLACE 0.5MG  OZEMPIC)    potassium chloride SA (KLOR-CON M)  20 MEQ tablet TAKE 1 TABLET BY MOUTH EVERY DAY AS NEEDED (when taking torsemide)    pregabalin (LYRICA) 150 MG capsule TAKE ONE CAPSULE BY MOUTH THREE TIMES DAILY    ranolazine (RANEXA) 1000 MG SR tablet TAKE 1 TABLET BY MOUTH TWICE DAILY    rosuvastatin (CRESTOR) 40 MG tablet Take 1 tablet (40 mg total) by mouth daily.    torsemide (DEMADEX) 20 MG tablet TAKE 2 TABLETS BY MOUTH DAILY AS NEEDED FOR swelling    traMADol (ULTRAM) 50 MG tablet Take 2 tablets (100 mg total) by mouth 2 (two) times daily. 11/02/2023: #120 on 10/26/23 #110 today LD 11/02/23   VASCEPA  1 g capsule TAKE TWO CAPSULES BY MOUTH TWICE DAILY    No facility-administered encounter medications on file as of 12/28/2023.    ALLERGIES:  Allergies  Allergen Reactions   Bee Venom Anaphylaxis and Swelling   Latex Itching   Misc. Sulfonamide Containing Compounds Nausea And Vomiting   Sulfa Drugs Cross Reactors Nausea And Vomiting    LABORATORY DATA:  I have reviewed the labs as listed.  CBC    Component Value Date/Time   WBC 10.2 12/16/2023 1120   RBC 4.42 12/16/2023 1120   HGB 13.5 12/16/2023 1120   HGB 13.6 10/26/2023 1205   HCT 40.7 12/16/2023 1120   HCT 40.3 10/26/2023 1205   PLT 291 12/16/2023 1120   PLT 298 10/26/2023 1205   MCV 92.1 12/16/2023 1120   MCV 91 10/26/2023 1205   MCH 30.5 12/16/2023 1120   MCHC 33.2 12/16/2023 1120   RDW 12.7 12/16/2023 1120   RDW 12.7 10/26/2023 1205   LYMPHSABS 2.4 12/16/2023 1120   LYMPHSABS 2.3 10/26/2023 1205   MONOABS 0.6 12/16/2023 1120   EOSABS 0.1 12/16/2023 1120   EOSABS 0.1 10/26/2023 1205   BASOSABS 0.0 12/16/2023 1120   BASOSABS 0.0 10/26/2023 1205      Latest Ref Rng & Units 07/25/2023    8:19 AM 01/13/2023    9:26 AM 10/20/2022    8:21 AM  CMP  Glucose 70 - 99 mg/dL 604  540  981   BUN 6 - 24 mg/dL 12  9  11    Creatinine 0.57 - 1.00 mg/dL 1.91  4.78  2.95   Sodium 134 - 144 mmol/L 137  132  136   Potassium 3.5 - 5.2 mmol/L 4.2  3.8  4.2   Chloride 96 - 106 mmol/L 101  101  103   CO2 20 - 29 mmol/L 21  22  18    Calcium 8.7 - 10.2 mg/dL 9.0  8.8  9.1   Total Protein 6.0 - 8.5 g/dL 6.8   6.6   Total Bilirubin 0.0 - 1.2 mg/dL <6.2   0.3   Alkaline Phos 44 - 121 IU/L 63   69   AST 0 - 40 IU/L 18   13   ALT 0 - 32 IU/L 16   10     DIAGNOSTIC IMAGING:  I have independently reviewed the relevant imaging and discussed with the patient.   WRAP UP:  All questions were answered. The patient knows to call the clinic with any problems, questions or concerns.  Medical decision making: Low  Time spent on visit: I  spent 15 minutes counseling the patient face to face. The total time spent in the appointment was 22 minutes and more than 50% was on counseling.  Mauro Kaufmann, NP  12/27/23 1:47 PM

## 2023-12-28 ENCOUNTER — Inpatient Hospital Stay: Attending: Oncology | Admitting: Oncology

## 2023-12-28 DIAGNOSIS — Z79899 Other long term (current) drug therapy: Secondary | ICD-10-CM | POA: Diagnosis not present

## 2023-12-28 DIAGNOSIS — D72829 Elevated white blood cell count, unspecified: Secondary | ICD-10-CM | POA: Diagnosis not present

## 2023-12-28 DIAGNOSIS — Z801 Family history of malignant neoplasm of trachea, bronchus and lung: Secondary | ICD-10-CM | POA: Diagnosis not present

## 2023-12-28 DIAGNOSIS — Z7984 Long term (current) use of oral hypoglycemic drugs: Secondary | ICD-10-CM | POA: Diagnosis not present

## 2023-12-28 DIAGNOSIS — Z803 Family history of malignant neoplasm of breast: Secondary | ICD-10-CM | POA: Insufficient documentation

## 2023-12-28 DIAGNOSIS — Z6841 Body Mass Index (BMI) 40.0 and over, adult: Secondary | ICD-10-CM | POA: Insufficient documentation

## 2023-12-28 DIAGNOSIS — E119 Type 2 diabetes mellitus without complications: Secondary | ICD-10-CM | POA: Insufficient documentation

## 2023-12-28 DIAGNOSIS — F17210 Nicotine dependence, cigarettes, uncomplicated: Secondary | ICD-10-CM | POA: Diagnosis not present

## 2023-12-29 DIAGNOSIS — N3946 Mixed incontinence: Secondary | ICD-10-CM | POA: Diagnosis not present

## 2023-12-29 DIAGNOSIS — E1159 Type 2 diabetes mellitus with other circulatory complications: Secondary | ICD-10-CM | POA: Diagnosis not present

## 2023-12-29 DIAGNOSIS — E1169 Type 2 diabetes mellitus with other specified complication: Secondary | ICD-10-CM | POA: Diagnosis not present

## 2024-01-02 ENCOUNTER — Encounter: Payer: Self-pay | Admitting: Physical Medicine and Rehabilitation

## 2024-01-02 ENCOUNTER — Encounter
Payer: Medicaid Other | Attending: Physical Medicine and Rehabilitation | Admitting: Physical Medicine and Rehabilitation

## 2024-01-02 VITALS — BP 126/77 | HR 77 | Ht 70.0 in | Wt 370.0 lb

## 2024-01-02 DIAGNOSIS — M545 Low back pain, unspecified: Secondary | ICD-10-CM | POA: Diagnosis not present

## 2024-01-02 DIAGNOSIS — M7918 Myalgia, other site: Secondary | ICD-10-CM | POA: Diagnosis not present

## 2024-01-02 DIAGNOSIS — M62838 Other muscle spasm: Secondary | ICD-10-CM | POA: Insufficient documentation

## 2024-01-02 DIAGNOSIS — G8929 Other chronic pain: Secondary | ICD-10-CM

## 2024-01-02 DIAGNOSIS — M797 Fibromyalgia: Secondary | ICD-10-CM | POA: Insufficient documentation

## 2024-01-02 MED ORDER — LIDOCAINE HCL 1 % IJ SOLN
9.0000 mL | Freq: Once | INTRAMUSCULAR | Status: AC
  Administered 2024-01-02: 9 mL

## 2024-01-02 MED ORDER — DULOXETINE HCL 60 MG PO CPEP: 60.0000 mg | ORAL_CAPSULE | Freq: Every day | ORAL | 1 refills | Status: DC

## 2024-01-02 MED ORDER — CYCLOBENZAPRINE HCL 10 MG PO TABS: 10.0000 mg | ORAL_TABLET | Freq: Every evening | ORAL | 5 refills | Status: DC | PRN

## 2024-01-02 NOTE — Progress Notes (Signed)
 Pt is a 43 yr old female with BMI of 56- weight 390 lbs- down to 371 lbs today, DM, GAD, HTN,  and  CHF- chronic pain- Appears to have L T8 foraminal thoracic stenosis.  As well as fibromyalgia and myofascial pain syndrome.   Here for f/u on Chronic pain and trigger point injection             Hasn't slept for 3 days.  Mind won't "shut off".  So much going on- so won't shut off at night- looks at ceiling.   Cannot also get comfortable and that doesn't help things.   Back less locked up- can move more recently.   Saw Dr Jean Rosenthal- 12/07/23-  Done something to her hip- can hug him because won't "pop out of socket" anymore. Managed to get her ot pop as well!  Sees him 2 days from now.  Roets feels like needs to pop all the time- but nothing pops.   Uses pack my meds to package meds.   Coughing- said doesn't have cold- cleaning out father's home- giving ot bank- covered with [employer].   Allergies really bothering her.   Plan: 1 Last UDS was 11/02/23- was appropriate- per clinic policy, doesn't need today.    2. Last refill of Lyrica 150 mg 3x/day- last filled 3/31- not due   3. Is due for refill of Duloxetine- 60 mg daily- sent in refills.    4. Con't Prozac- has refills; filled 11/2023  5. Con't Tramadol- 100 mg- 2x/day- has refills.    6. Patient here for trigger point injections for  Consent done and on chart.  Cleaned areas with alcohol and injected using a 27 gauge 1.5 inch needle  Injected 8 cc- wasted 1 cc Using 1% Lidocaine with no EPI  Upper traps B/L Levators- B/L  Posterior scalenes Middle scalenes- B/L  Splenius Capitus- B/L  Pectoralis Major- B/L  Rhomboids- B/L x2 Infraspinatus Teres Major/minor Thoracic paraspinals- B/L x2 Lumbar paraspinals- B/L x2 Other injections-  B/L forearms and B/L hands   Patient's level of pain prior was Current level of pain after injections is  There was no bleeding or complications.  Patient was advised to drink  a lot of water on day after injections to flush system Will have increased soreness for 12-48 hours after injections.  Can use Lidocaine patches the day AFTER injections Can use theracane on day of injections in places didn't inject Can use heating pad 4-6 hours AFTER injections  7. We discussed Magnesium- I use Magnesium - for muscle spasms. Never tried Flexeril or Robaxin-  Will try Flexeril/Cyclobenzaprine-  10 mg nightly as needed- try to take no more than 4-5x/week -take at least 2 days off/week so doesn't develop tolerance.     8.  F/U in 2 months- chronic pain/trp injections   9. Trying to take off Metformin and continue Ozempic.     I spent a total of 29   minutes on total care today- >50% coordination of care- due to d/w pt about  stressors, and grieving with son moving out- as well as medical issues- refills and adding Flexeril-

## 2024-01-02 NOTE — Patient Instructions (Signed)
 Plan: 1 Last UDS was 11/02/23- was appropriate- per clinic policy, doesn't need today.    2. Last refill of Lyrica 150 mg 3x/day- last filled 3/31- not due   3. Is due for refill of Duloxetine- 60 mg daily- sent in refills.    4. Con't Prozac- has refills; filled 11/2023  5. Con't Tramadol- 100 mg- 2x/day- has refills.    6. Patient here for trigger point injections for  Consent done and on chart.  Cleaned areas with alcohol and injected using a 27 gauge 1.5 inch needle  Injected 8 cc- wasted 1 cc Using 1% Lidocaine with no EPI  Upper traps B/L Levators- B/L  Posterior scalenes Middle scalenes- B/L  Splenius Capitus- B/L  Pectoralis Major- B/L  Rhomboids- B/L x2 Infraspinatus Teres Major/minor Thoracic paraspinals- B/L x2 Lumbar paraspinals- B/L x2 Other injections-  B/L forearms and B/L hands   Patient's level of pain prior was Current level of pain after injections is  There was no bleeding or complications.  Patient was advised to drink a lot of water on day after injections to flush system Will have increased soreness for 12-48 hours after injections.  Can use Lidocaine patches the day AFTER injections Can use theracane on day of injections in places didn't inject Can use heating pad 4-6 hours AFTER injections  7. We discussed Magnesium- I use Magnesium - for muscle spasms. Never tried Flexeril or Robaxin-  Will try Flexeril/Cyclobenzaprine-  10 mg nightly as needed- try to take no more than 4-5x/week -take at least 2 days off/week so doesn't develop tolerance.     8.  F/U in 2 months- chronic pain/trp injections   9. Trying to take off Metformin and continue Ozempic.
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
## 2024-01-04 ENCOUNTER — Ambulatory Visit: Admitting: Sports Medicine

## 2024-01-15 ENCOUNTER — Other Ambulatory Visit: Payer: Self-pay | Admitting: Internal Medicine

## 2024-01-15 ENCOUNTER — Other Ambulatory Visit: Payer: Self-pay | Admitting: Cardiology

## 2024-01-25 ENCOUNTER — Encounter: Payer: Self-pay | Admitting: Family Medicine

## 2024-01-25 ENCOUNTER — Ambulatory Visit: Payer: Medicaid Other | Admitting: Family Medicine

## 2024-01-25 NOTE — Progress Notes (Deleted)
 Subjective: CC:DM PCP: Katelyn Guerin, DO ZOX:[id number] [name] is a 43 y.o. female presenting to clinic today for:  1. Type 2 Diabetes with hypertension, hyperlipidemia:  Metformin  reduced at last visit.  She is here for 27-month follow-up. ***  Diabetes Health Maintenance Due  Topic Date Due   OPHTHALMOLOGY EXAM  09/29/2023   FOOT EXAM  10/21/2023   HEMOGLOBIN A1C  04/24/2024    Last A1c:  Lab Results  Component Value Date   HGBA1C 5.2 10/26/2023    ROS: ***dizziness, LOC, polyuria, polydipsia, unintended weight loss/gain, foot ulcerations, numbness or tingling in extremities, shortness of breath or chest pain.   ROS: Per HPI  Allergies  Allergen Reactions   Bee Venom Anaphylaxis and Swelling   Latex Itching   Misc. Sulfonamide Containing Compounds Nausea And Vomiting   Sulfa Drugs Cross Reactors Nausea And Vomiting   Past Medical History:  Diagnosis Date   Allergy    Anemia    Anginal pain (HCC)    Anxiety    Arthritis    CHF (congestive heart failure) (HCC)    Chronic asthmatic bronchitis (HCC) 08/17/2021   Chronic kidney disease    history of kidney stones   Coronary artery disease 09/2011   a.s/p BMS to LAD and angioplasty of RI in 09/2011 b. patent stent by cath in 02/2012, 12/2013, and 02/2016 with most recent showing 30-40% dLAD and 30-40% RCA stenosis   Depression    Diabetes mellitus    Dyspnea    GERD (gastroesophageal reflux disease)    History of kidney stones    Hyperlipidemia    Hypertension    Midsternal chest pain 03/03/2012   Migraines    Morbid obesity (HCC)    Myocardial infarct (HCC) 09/2011   Myocardial infarction (HCC) 02/2016   Neuromuscular disorder (HCC)    Palpitations 02/17/2012   Ureteral stone with hydronephrosis    Urinary tract obstruction due to kidney stone 02/15/2017    Current Outpatient Medications:    Accu-Chek Softclix Lancets lancets, CHECK BOOD SUGAR FOUR TIMES DAILY Dx E11.9, Disp: 400 each, Rfl: 3    acetaminophen  (TYLENOL ) 500 MG tablet, Take 1,000 mg by mouth every 6 (six) hours as needed for moderate pain., Disp: , Rfl:    albuterol  (VENTOLIN  HFA) 108 (90 Base) MCG/ACT inhaler, Inhale 2 puffs into the lungs every 4 (four) hours as needed for wheezing or shortness of breath., Disp: 8 g, Rfl: 2   amLODipine  (NORVASC ) 5 MG tablet, TAKE 1 TABLET BY MOUTH DAILY, Disp: 30 tablet, Rfl: 6   aspirin  81 MG chewable tablet, Chew 81 mg by mouth daily., Disp: , Rfl:    bisoprolol  (ZEBETA ) 5 MG tablet, TAKE 1 TABLET BY MOUTH TWICE DAILY, Disp: 60 tablet, Rfl: 3   blood glucose meter kit and supplies, Dispense based on patient and insurance preference. Use up to four times daily as directed. (FOR ICD-10:  E11.22). Check BG daily, Disp: 1 each, Rfl: 0   Blood Glucose Monitoring Suppl (ACCU-CHEK GUIDE) w/Device KIT, Test BS 4 times daily Dx E11.9, Disp: 1 kit, Rfl: 0   budesonide -formoterol  (SYMBICORT ) 160-4.5 MCG/ACT inhaler, INHALE TWO PUFFS first thing IN THE MORNING AND THEN INHALE another 2 PUFFS about 12 HOURS LATER, Disp: 10.2 g, Rfl: 11   busPIRone  (BUSPAR ) 10 MG tablet, TAKE 1 TABLET BY MOUTH TWICE DAILY, Disp: 60 tablet, Rfl: 2   clopidogrel  (PLAVIX ) 75 MG tablet, TAKE 1 TABLET BY MOUTH EVERY DAY, Disp: 90 tablet, Rfl: 2  cyclobenzaprine  (FLEXERIL ) 10 MG tablet, Take 1 tablet (10 mg total) by mouth at bedtime as needed for muscle spasms., Disp: 30 tablet, Rfl: 5   DULoxetine  (CYMBALTA ) 60 MG capsule, Take 1 capsule (60 mg total) by mouth at bedtime., Disp: 90 capsule, Rfl: 1   Evolocumab  (REPATHA  SURECLICK) 140 MG/ML SOAJ, INJECT 140 MG UNDER THE SKIN EVERY 14 DAYS, Disp: 6 mL, Rfl: 2   ezetimibe  (ZETIA ) 10 MG tablet, TAKE 1 TABLET BY MOUTH EVERY DAY, Disp: 90 tablet, Rfl: 2   fenofibrate  (TRICOR ) 48 MG tablet, TAKE 1 TABLET BY MOUTH DAILY, Disp: 30 tablet, Rfl: 2   FLUoxetine  (PROZAC ) 20 MG capsule, TAKE ONE CAPSULE BY MOUTH EVERY DAY GIVE WITH CYMBALTA  DUE TO NERVE PAIN AND ANXIETY/DEPRESSION,  Disp: 90 capsule, Rfl: 1   glucose blood (ACCU-CHEK GUIDE TEST) test strip, CHECK BOOD SUGAR FOUR TIMES DAILY Dx E11.9, Disp: 400 strip, Rfl: 3   icosapent  Ethyl (VASCEPA ) 1 g capsule, TAKE TWO CAPSULES BY MOUTH TWICE DAILY, Disp: 360 capsule, Rfl: 2   losartan  (COZAAR ) 25 MG tablet, TAKE 1 TABLET BY MOUTH DAILY, Disp: 90 tablet, Rfl: 2   medroxyPROGESTERone  (PROVERA ) 5 MG tablet, TAKE 1 TABLET BY MOUTH DAILY, Disp: 90 tablet, Rfl: 2   metFORMIN  (GLUCOPHAGE ) 1000 MG tablet, Take 1 tablet (1,000 mg total) by mouth daily with breakfast., Disp: 90 tablet, Rfl: 3   nitroGLYCERIN  (NITROSTAT ) 0.4 MG SL tablet, DISSOLVE 1 TABLET UNDER THE TONGUE EVERY 5 MINUTES AS NEEDED FOR CHEST PAIN. DO NOT EXCEED A TOTAL OF 3 DOSES IN 15 MINUTES., Disp: 25 tablet, Rfl: 3   OZEMPIC , 1 MG/DOSE, 4 MG/3ML SOPN, INJECT 1 MG WEEKLY (TO REPLACE 0.5MG  OZEMPIC ), Disp: 9 mL, Rfl: 2   potassium chloride  SA (KLOR-CON  M) 20 MEQ tablet, TAKE 1 TABLET BY MOUTH EVERY DAY AS NEEDED (when taking torsemide ), Disp: 90 tablet, Rfl: 3   pregabalin  (LYRICA ) 150 MG capsule, TAKE ONE CAPSULE BY MOUTH THREE TIMES DAILY, Disp: 90 capsule, Rfl: 5   ranolazine  (RANEXA ) 1000 MG SR tablet, TAKE 1 TABLET BY MOUTH TWICE DAILY, Disp: 180 tablet, Rfl: 2   rosuvastatin  (CRESTOR ) 40 MG tablet, Take 1 tablet (40 mg total) by mouth daily., Disp: 90 tablet, Rfl: 0   torsemide  (DEMADEX ) 20 MG tablet, TAKE 2 TABLETS BY MOUTH DAILY AS NEEDED FOR swelling, Disp: 180 tablet, Rfl: 3   traMADol  (ULTRAM ) 50 MG tablet, Take 2 tablets (100 mg total) by mouth 2 (two) times daily., Disp: 120 tablet, Rfl: 5 Social History   Socioeconomic History   Marital status: Married    Spouse name: [name]   Number of children: 2   Years of education: 10   Highest education level: Not on file  Occupational History   Not on file  Tobacco Use   Smoking status: Some Days    Current packs/day: 0.50    Average packs/day: 0.5 packs/day for 33.9 years (16.9 ttl pk-yrs)    Types:  Cigarettes    Start date: 03/18/1990   Smokeless tobacco: Never   Tobacco comments:    Vapes currently   Vaping Use   Vaping status: Some Days   Substances: Nicotine   Substance and Sexual Activity   Alcohol use: Not Currently    Alcohol/week: 0.0 standard drinks of alcohol    Comment: maybe once a year   Drug use: No   Sexual activity: Yes    Birth control/protection: None    Comment: PCOS  Other Topics Concern   Not on file  Social History Narrative   Unemployed   Applied for disability   Leisure "take care of house and kids"   Walks when able      She is working to get on disability. She previously worked on Production designer, theatre/television/film.      Social Drivers of Corporate investment banker Strain: Low Risk  (10/28/2017)   Overall Financial Resource Strain (CARDIA)    Difficulty of Paying Living Expenses: Not hard at all  Food Insecurity: No Food Insecurity (11/12/2022)   Hunger Vital Sign    Worried About Running Out of Food in the Last Year: Never true    Ran Out of Food in the Last Year: Never true  Transportation Needs: No Transportation Needs (11/12/2022)   PRAPARE - Administrator, Civil Service (Medical): No    Lack of Transportation (Non-Medical): No  Physical Activity: Inactive (10/28/2017)   Exercise Vital Sign    Days of Exercise per Week: 0 days    Minutes of Exercise per Session: 0 min  Stress: Stress Concern Present (10/28/2017)   [employer] of Occupational Health - Occupational Stress Questionnaire    Feeling of Stress : To some extent  Social Connections: Somewhat Isolated (10/28/2017)   Social Connection and Isolation Panel [NHANES]    Frequency of Communication with Friends and Family: More than three times a week    Frequency of Social Gatherings with Friends and Family: More than three times a week    Attends Religious Services: Never    Database administrator or Organizations: No    Attends Banker Meetings: Never    Marital Status:  Married  Catering manager Violence: Not At Risk (11/12/2022)   Humiliation, Afraid, Rape, and Kick questionnaire    Fear of Current or Ex-Partner: No    Emotionally Abused: No    Physically Abused: No    Sexually Abused: No   Family History  Problem Relation Age of Onset   Heart murmur Father    Diabetes Father    Hypertension Father    Hyperlipidemia Father    Cancer Father        throat   Diabetes Mother    Cancer Mother        breast.uterine, ovarian   Asthma Son    Appendicitis Son    Hernia Son    Mental illness Son        Bipolar, personality Katelyn/o   Stroke Maternal Grandmother    Cancer Maternal Grandmother        breast   Cancer Paternal Grandfather        lung   Colon cancer Neg Hx     Objective: Office vital signs reviewed. There were no vitals taken for this visit.  Physical Examination:  General: Awake, alert, *** nourished, No acute distress HEENT: Normal    Neck: No masses palpated. No lymphadenopathy    Ears: Tympanic membranes intact, normal light reflex, no erythema, no bulging    Eyes: PERRLA, extraocular membranes intact, sclera ***    Nose: nasal turbinates moist, *** nasal discharge    Throat: moist mucus membranes, no erythema, *** tonsillar exudate.  Airway is patent Cardio: regular rate and rhythm, S1S2 heard, no murmurs appreciated Pulm: clear to auscultation bilaterally, no wheezes, rhonchi or rales; normal work of breathing on room air GI: soft, non-tender, non-distended, bowel sounds present x4, no hepatomegaly, no splenomegaly, no masses GU: external vaginal tissue ***, cervix ***, *** punctate lesions on  cervix appreciated, *** discharge from cervical os, *** bleeding, *** cervical motion tenderness, *** abdominal/ adnexal masses Extremities: warm, well perfused, No edema, cyanosis or clubbing; +*** pulses bilaterally MSK: *** gait and *** station Skin: dry; intact; no rashes or lesions Neuro: *** Strength and light touch sensation grossly  intact, *** DTRs ***/4  Diabetic Foot Exam - Simple   No data filed      Assessment/ Plan: 43 y.o. female   Diabetes mellitus treated with injections of non-insulin  medication (HCC)  Hypertension associated with diabetes (HCC)  Hyperlipidemia associated with type 2 diabetes mellitus (HCC)   ***  [name] Bambi Bonine, DO [facility] [phone]

## 2024-01-29 DIAGNOSIS — N3946 Mixed incontinence: Secondary | ICD-10-CM | POA: Diagnosis not present

## 2024-01-29 DIAGNOSIS — E1159 Type 2 diabetes mellitus with other circulatory complications: Secondary | ICD-10-CM | POA: Diagnosis not present

## 2024-01-29 DIAGNOSIS — E1169 Type 2 diabetes mellitus with other specified complication: Secondary | ICD-10-CM | POA: Diagnosis not present

## 2024-02-12 ENCOUNTER — Other Ambulatory Visit: Payer: Self-pay | Admitting: Cardiology

## 2024-02-12 ENCOUNTER — Other Ambulatory Visit: Payer: Self-pay | Admitting: Family Medicine

## 2024-02-12 DIAGNOSIS — E1169 Type 2 diabetes mellitus with other specified complication: Secondary | ICD-10-CM

## 2024-02-12 DIAGNOSIS — F411 Generalized anxiety disorder: Secondary | ICD-10-CM

## 2024-02-12 DIAGNOSIS — F418 Other specified anxiety disorders: Secondary | ICD-10-CM

## 2024-02-17 DIAGNOSIS — E1169 Type 2 diabetes mellitus with other specified complication: Secondary | ICD-10-CM | POA: Diagnosis not present

## 2024-02-17 DIAGNOSIS — E1159 Type 2 diabetes mellitus with other circulatory complications: Secondary | ICD-10-CM | POA: Diagnosis not present

## 2024-02-17 DIAGNOSIS — N3946 Mixed incontinence: Secondary | ICD-10-CM | POA: Diagnosis not present

## 2024-03-02 ENCOUNTER — Ambulatory Visit: Admitting: Family Medicine

## 2024-03-12 ENCOUNTER — Other Ambulatory Visit: Payer: Self-pay | Admitting: Family Medicine

## 2024-03-12 ENCOUNTER — Other Ambulatory Visit: Payer: Self-pay | Admitting: Physical Medicine and Rehabilitation

## 2024-03-12 DIAGNOSIS — E1169 Type 2 diabetes mellitus with other specified complication: Secondary | ICD-10-CM

## 2024-03-12 DIAGNOSIS — F418 Other specified anxiety disorders: Secondary | ICD-10-CM

## 2024-03-12 DIAGNOSIS — F411 Generalized anxiety disorder: Secondary | ICD-10-CM

## 2024-03-12 DIAGNOSIS — G8929 Other chronic pain: Secondary | ICD-10-CM

## 2024-03-12 NOTE — Telephone Encounter (Signed)
 PMP was Reviews,  Dr Raynaldo Call note was reviewd.  Tramadol  e-scribed to pharmacy.

## 2024-03-13 ENCOUNTER — Encounter: Payer: Self-pay | Admitting: Family Medicine

## 2024-03-13 ENCOUNTER — Ambulatory Visit (INDEPENDENT_AMBULATORY_CARE_PROVIDER_SITE_OTHER): Admitting: Family Medicine

## 2024-03-13 VITALS — BP 111/64 | HR 60 | Temp 98.6°F | Ht 70.0 in | Wt 379.0 lb

## 2024-03-13 DIAGNOSIS — F411 Generalized anxiety disorder: Secondary | ICD-10-CM

## 2024-03-13 DIAGNOSIS — E119 Type 2 diabetes mellitus without complications: Secondary | ICD-10-CM | POA: Diagnosis not present

## 2024-03-13 DIAGNOSIS — R0602 Shortness of breath: Secondary | ICD-10-CM | POA: Diagnosis not present

## 2024-03-13 DIAGNOSIS — Z7985 Long-term (current) use of injectable non-insulin antidiabetic drugs: Secondary | ICD-10-CM | POA: Diagnosis not present

## 2024-03-13 DIAGNOSIS — E1169 Type 2 diabetes mellitus with other specified complication: Secondary | ICD-10-CM

## 2024-03-13 DIAGNOSIS — E1140 Type 2 diabetes mellitus with diabetic neuropathy, unspecified: Secondary | ICD-10-CM

## 2024-03-13 DIAGNOSIS — E785 Hyperlipidemia, unspecified: Secondary | ICD-10-CM

## 2024-03-13 DIAGNOSIS — I152 Hypertension secondary to endocrine disorders: Secondary | ICD-10-CM

## 2024-03-13 DIAGNOSIS — F418 Other specified anxiety disorders: Secondary | ICD-10-CM | POA: Diagnosis not present

## 2024-03-13 DIAGNOSIS — E1159 Type 2 diabetes mellitus with other circulatory complications: Secondary | ICD-10-CM | POA: Diagnosis not present

## 2024-03-13 LAB — BAYER DCA HB A1C WAIVED: HB A1C (BAYER DCA - WAIVED): 5.6 % (ref 4.8–5.6)

## 2024-03-13 MED ORDER — ROSUVASTATIN CALCIUM 40 MG PO TABS: 40.0000 mg | ORAL_TABLET | Freq: Every day | ORAL | 3 refills | Status: DC

## 2024-03-13 MED ORDER — ALBUTEROL SULFATE HFA 108 (90 BASE) MCG/ACT IN AERS: 2.0000 | INHALATION_SPRAY | RESPIRATORY_TRACT | 2 refills | Status: AC | PRN

## 2024-03-13 MED ORDER — BUSPIRONE HCL 10 MG PO TABS: 10.0000 mg | ORAL_TABLET | Freq: Two times a day (BID) | ORAL | 3 refills | Status: DC

## 2024-03-13 NOTE — Progress Notes (Signed)
 Subjective: CC:DM PCP: Eliodoro Guerin, DO ZOX:[id number] D Lachapelle is a 43 y.o. female presenting to clinic today for:  1. Type 2 Diabetes with hypertension, hyperlipidemia w/ neuropathy:  compliant with all medications.  Continues to suffer with neuropathy and mobility.  Needs a letter to give to social services explaining that she has medical issues which require additional assistance.  Diabetes Health Maintenance Due  Topic Date Due   OPHTHALMOLOGY EXAM  09/29/2023   FOOT EXAM  10/21/2023   HEMOGLOBIN A1C  04/24/2024    Last A1c:  Lab Results  Component Value Date   HGBA1C 5.2 10/26/2023    ROS: Has chronic numbness of the feet.  Denies any chest pain, shortness of breath outside of baseline but does need a refill on her albuterol  inhaler for seasonal use   ROS: Per HPI  Allergies  Allergen Reactions   Bee Venom Anaphylaxis and Swelling   Latex Itching   Misc. Sulfonamide Containing Compounds Nausea And Vomiting   Sulfa Drugs Cross Reactors Nausea And Vomiting   Past Medical History:  Diagnosis Date   Allergy    Anemia    Anginal pain (HCC)    Anxiety    Arthritis    CHF (congestive heart failure) (HCC)    Chronic asthmatic bronchitis (HCC) 08/17/2021   Chronic kidney disease    history of kidney stones   Coronary artery disease 09/2011   a.s/p BMS to LAD and angioplasty of RI in 09/2011 b. patent stent by cath in 02/2012, 12/2013, and 02/2016 with most recent showing 30-40% dLAD and 30-40% RCA stenosis   Depression    Diabetes mellitus    Dyspnea    GERD (gastroesophageal reflux disease)    History of kidney stones    Hyperlipidemia    Hypertension    Midsternal chest pain 03/03/2012   Migraines    Morbid obesity (HCC)    Myocardial infarct (HCC) 09/2011   Myocardial infarction (HCC) 02/2016   Neuromuscular disorder (HCC)    Palpitations 02/17/2012   Ureteral stone with hydronephrosis    Urinary tract obstruction due to kidney stone 02/15/2017     Current Outpatient Medications:    Accu-Chek Softclix Lancets lancets, CHECK BOOD SUGAR FOUR TIMES DAILY Dx E11.9, Disp: 400 each, Rfl: 3   acetaminophen  (TYLENOL ) 500 MG tablet, Take 1,000 mg by mouth every 6 (six) hours as needed for moderate pain., Disp: , Rfl:    albuterol  (VENTOLIN  HFA) 108 (90 Base) MCG/ACT inhaler, Inhale 2 puffs into the lungs every 4 (four) hours as needed for wheezing or shortness of breath., Disp: 8 g, Rfl: 2   amLODipine  (NORVASC ) 5 MG tablet, TAKE 1 TABLET BY MOUTH DAILY, Disp: 30 tablet, Rfl: 6   aspirin  81 MG chewable tablet, Chew 81 mg by mouth daily., Disp: , Rfl:    bisoprolol  (ZEBETA ) 5 MG tablet, TAKE 1 TABLET BY MOUTH TWICE DAILY, Disp: 60 tablet, Rfl: 3   blood glucose meter kit and supplies, Dispense based on patient and insurance preference. Use up to four times daily as directed. (FOR ICD-10:  E11.22). Check BG daily, Disp: 1 each, Rfl: 0   Blood Glucose Monitoring Suppl (ACCU-CHEK GUIDE) w/Device KIT, Test BS 4 times daily Dx E11.9, Disp: 1 kit, Rfl: 0   budesonide -formoterol  (SYMBICORT ) 160-4.5 MCG/ACT inhaler, INHALE TWO PUFFS first thing IN THE MORNING AND THEN INHALE another 2 PUFFS about 12 HOURS LATER, Disp: 10.2 g, Rfl: 11   busPIRone  (BUSPAR ) 10 MG tablet, TAKE 1 TABLET  BY MOUTH TWICE DAILY, Disp: 60 tablet, Rfl: 0   clopidogrel  (PLAVIX ) 75 MG tablet, TAKE 1 TABLET BY MOUTH EVERY DAY, Disp: 90 tablet, Rfl: 2   cyclobenzaprine  (FLEXERIL ) 10 MG tablet, Take 1 tablet (10 mg total) by mouth at bedtime as needed for muscle spasms., Disp: 30 tablet, Rfl: 5   DULoxetine  (CYMBALTA ) 60 MG capsule, Take 1 capsule (60 mg total) by mouth at bedtime., Disp: 90 capsule, Rfl: 1   Evolocumab  (REPATHA  SURECLICK) 140 MG/ML SOAJ, INJECT 140 MG UNDER THE SKIN EVERY 14 DAYS, Disp: 6 mL, Rfl: 2   ezetimibe  (ZETIA ) 10 MG tablet, TAKE 1 TABLET BY MOUTH EVERY DAY, Disp: 90 tablet, Rfl: 2   fenofibrate  (TRICOR ) 48 MG tablet, TAKE 1 TABLET BY MOUTH DAILY, Disp: 30  tablet, Rfl: 0   FLUoxetine  (PROZAC ) 20 MG capsule, TAKE ONE CAPSULE BY MOUTH EVERY DAY GIVE WITH CYMBALTA  DUE TO NERVE PAIN AND ANXIETY/DEPRESSION, Disp: 90 capsule, Rfl: 1   glucose blood (ACCU-CHEK GUIDE TEST) test strip, CHECK BOOD SUGAR FOUR TIMES DAILY Dx E11.9, Disp: 400 strip, Rfl: 3   icosapent  Ethyl (VASCEPA ) 1 g capsule, TAKE TWO CAPSULES BY MOUTH TWICE DAILY, Disp: 360 capsule, Rfl: 2   losartan  (COZAAR ) 25 MG tablet, TAKE 1 TABLET BY MOUTH DAILY, Disp: 90 tablet, Rfl: 2   medroxyPROGESTERone  (PROVERA ) 5 MG tablet, TAKE 1 TABLET BY MOUTH DAILY, Disp: 90 tablet, Rfl: 2   metFORMIN  (GLUCOPHAGE ) 1000 MG tablet, Take 1 tablet (1,000 mg total) by mouth daily with breakfast., Disp: 90 tablet, Rfl: 3   nitroGLYCERIN  (NITROSTAT ) 0.4 MG SL tablet, DISSOLVE 1 TABLET UNDER THE TONGUE EVERY 5 MINUTES AS NEEDED FOR CHEST PAIN. DO NOT EXCEED A TOTAL OF 3 DOSES IN 15 MINUTES., Disp: 25 tablet, Rfl: 3   OZEMPIC , 1 MG/DOSE, 4 MG/3ML SOPN, INJECT 1 MG WEEKLY (TO REPLACE 0.5MG  OZEMPIC ), Disp: 9 mL, Rfl: 2   potassium chloride  SA (KLOR-CON  M) 20 MEQ tablet, TAKE 1 TABLET BY MOUTH EVERY DAY AS NEEDED (when taking torsemide ), Disp: 90 tablet, Rfl: 3   pregabalin  (LYRICA ) 150 MG capsule, TAKE ONE CAPSULE BY MOUTH THREE TIMES DAILY, Disp: 90 capsule, Rfl: 5   ranolazine  (RANEXA ) 1000 MG SR tablet, TAKE 1 TABLET BY MOUTH TWICE DAILY, Disp: 180 tablet, Rfl: 2   rosuvastatin  (CRESTOR ) 40 MG tablet, Take 1 tablet (40 mg total) by mouth daily., Disp: 90 tablet, Rfl: 0   torsemide  (DEMADEX ) 20 MG tablet, TAKE 2 TABLETS BY MOUTH DAILY AS NEEDED FOR swelling, Disp: 180 tablet, Rfl: 3   traMADol  (ULTRAM ) 50 MG tablet, TAKE 2 TABLETS BY MOUTH TWICE DAILY, Disp: 120 tablet, Rfl: 2 Social History   Socioeconomic History   Marital status: Married    Spouse name: Marylou Sobers   Number of children: 2   Years of education: 10   Highest education level: Not on file  Occupational History   Not on file  Tobacco Use   Smoking  status: Some Days    Current packs/day: 0.50    Average packs/day: 0.5 packs/day for 34.0 years (17.0 ttl pk-yrs)    Types: Cigarettes    Start date: 03/18/1990   Smokeless tobacco: Never   Tobacco comments:    Vapes currently   Vaping Use   Vaping status: Some Days   Substances: Nicotine   Substance and Sexual Activity   Alcohol use: Not Currently    Alcohol/week: 0.0 standard drinks of alcohol    Comment: maybe once a year   Drug use: No  Sexual activity: Yes    Birth control/protection: None    Comment: PCOS  Other Topics Concern   Not on file  Social History Narrative   Unemployed   Applied for disability   Leisure take care of house and kids   Walks when able      She is working to get on disability. She previously worked on Production designer, theatre/television/film.      Social Drivers of Corporate investment banker Strain: Low Risk  (10/28/2017)   Overall Financial Resource Strain (CARDIA)    Difficulty of Paying Living Expenses: Not hard at all  Food Insecurity: No Food Insecurity (11/12/2022)   Hunger Vital Sign    Worried About Running Out of Food in the Last Year: Never true    Ran Out of Food in the Last Year: Never true  Transportation Needs: No Transportation Needs (11/12/2022)   PRAPARE - Administrator, Civil Service (Medical): No    Lack of Transportation (Non-Medical): No  Physical Activity: Inactive (10/28/2017)   Exercise Vital Sign    Days of Exercise per Week: 0 days    Minutes of Exercise per Session: 0 min  Stress: Stress Concern Present (10/28/2017)   [employer] of Occupational Health - Occupational Stress Questionnaire    Feeling of Stress : To some extent  Social Connections: Somewhat Isolated (10/28/2017)   Social Connection and Isolation Panel    Frequency of Communication with Friends and Family: More than three times a week    Frequency of Social Gatherings with Friends and Family: More than three times a week    Attends Religious Services: Never     Database administrator or Organizations: No    Attends Banker Meetings: Never    Marital Status: Married  Catering manager Violence: Not At Risk (11/12/2022)   Humiliation, Afraid, Rape, and Kick questionnaire    Fear of Current or Ex-Partner: No    Emotionally Abused: No    Physically Abused: No    Sexually Abused: No   Family History  Problem Relation Age of Onset   Heart murmur Father    Diabetes Father    Hypertension Father    Hyperlipidemia Father    Cancer Father        throat   Diabetes Mother    Cancer Mother        breast.uterine, ovarian   Asthma Son    Appendicitis Son    Hernia Son    Mental illness Son        Bipolar, personality d/o   Stroke Maternal Grandmother    Cancer Maternal Grandmother        breast   Cancer Paternal Grandfather        lung   Colon cancer Neg Hx     Objective: Office vital signs reviewed. BP 111/64   Pulse 60   Temp 98.6 F (37 C)   Ht 5' 10 (1.778 m)   Wt (!) 379 lb (171.9 kg)   SpO2 97%   BMI 54.38 kg/m   Physical Examination:  General: Awake, alert, super morbidly obese, No acute distress HEENT: Sclera white.  Moist mucous membranes Cardio: regular rate and rhythm, S1S2 heard, no murmurs appreciated Pulm: clear to auscultation bilaterally, no wheezes, rhonchi or rales; normal work of breathing on room air  Diabetic Foot Exam - Simple   Simple Foot Form Diabetic Foot exam was performed with the following findings: Yes 03/13/2024 10:25 AM  Visual Inspection See comments: Yes Sensation Testing See comments: Yes Pulse Check Posterior Tibialis and Dorsalis pulse intact bilaterally: Yes Comments Total absent monofilament sensation.  She has some onychomycotic changes to the nails bilaterally.  No ulcerations or skin breakdown.  Callus formation on the heels bilaterally      Assessment/ Plan: 43 y.o. female   Diabetes mellitus treated with injections of non-insulin  medication (HCC) - Plan:  Microalbumin / creatinine urine ratio, Bayer DCA Hb A1c Waived  Hypertension associated with diabetes (HCC)  Hyperlipidemia associated with type 2 diabetes mellitus (HCC) - Plan: rosuvastatin  (CRESTOR ) 40 MG tablet  Diabetic neuropathy, painful (HCC)  Shortness of breath - Plan: albuterol  (VENTOLIN  HFA) 108 (90 Base) MCG/ACT inhaler  Generalized anxiety disorder - Plan: busPIRone  (BUSPAR ) 10 MG tablet  Situational anxiety - Plan: busPIRone  (BUSPAR ) 10 MG tablet  A1c controlled at 5.6 today.  Urine microalbumin ordered  Blood pressure controlled  She will continue multi therapy treatment for cholesterol and secondary prevention of heart disease  Her neuropathy is managed by pain specialist.  Her foot exam was notable for total absent monofilament sensation in the feet bilaterally.  Letter provided as requested  I have renewed her albuterol  for as needed use  Did not discuss anxiety disorder but needed refills of BuSpar  so this was sent  Eliodoro Guerin, DO [facility] [phone]

## 2024-03-14 ENCOUNTER — Ambulatory Visit: Payer: Self-pay | Admitting: Family Medicine

## 2024-03-14 LAB — MICROALBUMIN / CREATININE URINE RATIO
Creatinine, Urine: 159.1 mg/dL
Microalb/Creat Ratio: 70 mg/g{creat} — ABNORMAL HIGH (ref 0–29)
Microalbumin, Urine: 111.4 ug/mL

## 2024-03-19 DIAGNOSIS — E1159 Type 2 diabetes mellitus with other circulatory complications: Secondary | ICD-10-CM | POA: Diagnosis not present

## 2024-03-19 DIAGNOSIS — E1169 Type 2 diabetes mellitus with other specified complication: Secondary | ICD-10-CM | POA: Diagnosis not present

## 2024-03-19 DIAGNOSIS — N3946 Mixed incontinence: Secondary | ICD-10-CM | POA: Diagnosis not present

## 2024-03-27 ENCOUNTER — Observation Stay (HOSPITAL_COMMUNITY)

## 2024-03-27 ENCOUNTER — Observation Stay (HOSPITAL_COMMUNITY)
Admission: EM | Admit: 2024-03-27 | Discharge: 2024-03-28 | Disposition: A | Discharge status: Home / Self Care | Attending: Family Medicine | Admitting: Family Medicine

## 2024-03-27 ENCOUNTER — Other Ambulatory Visit: Payer: Self-pay

## 2024-03-27 ENCOUNTER — Encounter (HOSPITAL_COMMUNITY): Payer: Self-pay | Admitting: Internal Medicine

## 2024-03-27 ENCOUNTER — Emergency Department (HOSPITAL_COMMUNITY)

## 2024-03-27 DIAGNOSIS — Z720 Tobacco use: Secondary | ICD-10-CM | POA: Diagnosis present

## 2024-03-27 DIAGNOSIS — E785 Hyperlipidemia, unspecified: Secondary | ICD-10-CM | POA: Insufficient documentation

## 2024-03-27 DIAGNOSIS — Z7982 Long term (current) use of aspirin: Secondary | ICD-10-CM | POA: Insufficient documentation

## 2024-03-27 DIAGNOSIS — I130 Hypertensive heart and chronic kidney disease with heart failure and stage 1 through stage 4 chronic kidney disease, or unspecified chronic kidney disease: Secondary | ICD-10-CM | POA: Insufficient documentation

## 2024-03-27 DIAGNOSIS — F17200 Nicotine dependence, unspecified, uncomplicated: Secondary | ICD-10-CM | POA: Insufficient documentation

## 2024-03-27 DIAGNOSIS — I2510 Atherosclerotic heart disease of native coronary artery without angina pectoris: Secondary | ICD-10-CM

## 2024-03-27 DIAGNOSIS — R079 Chest pain, unspecified: Principal | ICD-10-CM | POA: Diagnosis present

## 2024-03-27 DIAGNOSIS — I5032 Chronic diastolic (congestive) heart failure: Secondary | ICD-10-CM

## 2024-03-27 DIAGNOSIS — E119 Type 2 diabetes mellitus without complications: Secondary | ICD-10-CM | POA: Diagnosis not present

## 2024-03-27 DIAGNOSIS — Z6841 Body Mass Index (BMI) 40.0 and over, adult: Secondary | ICD-10-CM | POA: Diagnosis not present

## 2024-03-27 DIAGNOSIS — Z7901 Long term (current) use of anticoagulants: Secondary | ICD-10-CM | POA: Insufficient documentation

## 2024-03-27 DIAGNOSIS — R9389 Abnormal findings on diagnostic imaging of other specified body structures: Secondary | ICD-10-CM | POA: Diagnosis not present

## 2024-03-27 DIAGNOSIS — I152 Hypertension secondary to endocrine disorders: Secondary | ICD-10-CM | POA: Diagnosis present

## 2024-03-27 DIAGNOSIS — Z7985 Long-term (current) use of injectable non-insulin antidiabetic drugs: Secondary | ICD-10-CM | POA: Diagnosis not present

## 2024-03-27 DIAGNOSIS — I200 Unstable angina: Principal | ICD-10-CM

## 2024-03-27 DIAGNOSIS — R42 Dizziness and giddiness: Secondary | ICD-10-CM | POA: Diagnosis not present

## 2024-03-27 DIAGNOSIS — I10 Essential (primary) hypertension: Secondary | ICD-10-CM | POA: Diagnosis not present

## 2024-03-27 DIAGNOSIS — Z79899 Other long term (current) drug therapy: Secondary | ICD-10-CM | POA: Insufficient documentation

## 2024-03-27 DIAGNOSIS — E1169 Type 2 diabetes mellitus with other specified complication: Secondary | ICD-10-CM | POA: Diagnosis present

## 2024-03-27 DIAGNOSIS — E876 Hypokalemia: Secondary | ICD-10-CM | POA: Diagnosis not present

## 2024-03-27 DIAGNOSIS — N189 Chronic kidney disease, unspecified: Secondary | ICD-10-CM | POA: Diagnosis not present

## 2024-03-27 DIAGNOSIS — E782 Mixed hyperlipidemia: Secondary | ICD-10-CM | POA: Insufficient documentation

## 2024-03-27 DIAGNOSIS — R0602 Shortness of breath: Secondary | ICD-10-CM | POA: Diagnosis not present

## 2024-03-27 LAB — HEMOGLOBIN A1C
Hgb A1c MFr Bld: 5.2 % (ref 4.8–5.6)
Mean Plasma Glucose: 102.54 mg/dL

## 2024-03-27 LAB — ECHOCARDIOGRAM COMPLETE
Area-P 1/2: 5.02 cm2
Calc EF: 67.7 %
Height: 70 in
S' Lateral: 2.7 cm
Single Plane A2C EF: 64.5 %
Single Plane A4C EF: 66.5 %
Weight: 6060 [oz_av]

## 2024-03-27 LAB — CBC
HCT: 38.2 % (ref 36.0–46.0)
Hemoglobin: 12.9 g/dL (ref 12.0–15.0)
MCH: 30.5 pg (ref 26.0–34.0)
MCHC: 33.8 g/dL (ref 30.0–36.0)
MCV: 90.3 fL (ref 80.0–100.0)
Platelets: 282 10*3/uL (ref 150–400)
RBC: 4.23 MIL/uL (ref 3.87–5.11)
RDW: 12.3 % (ref 11.5–15.5)
WBC: 11.6 10*3/uL — ABNORMAL HIGH (ref 4.0–10.5)
nRBC: 0 % (ref 0.0–0.2)

## 2024-03-27 LAB — BASIC METABOLIC PANEL WITH GFR
Anion gap: 12 (ref 5–15)
BUN: 15 mg/dL (ref 6–20)
CO2: 20 mmol/L — ABNORMAL LOW (ref 22–32)
Calcium: 9.4 mg/dL (ref 8.9–10.3)
Chloride: 102 mmol/L (ref 98–111)
Creatinine, Ser: 0.77 mg/dL (ref 0.44–1.00)
GFR, Estimated: >60 mL/min (ref >60)
Glucose, Bld: 119 mg/dL — ABNORMAL HIGH (ref 70–99)
Potassium: 3.2 mmol/L — ABNORMAL LOW (ref 3.5–5.1)
Sodium: 134 mmol/L — ABNORMAL LOW (ref 135–145)

## 2024-03-27 LAB — MAGNESIUM: Magnesium: 1.8 mg/dL (ref 1.7–2.4)

## 2024-03-27 LAB — TROPONIN I (HIGH SENSITIVITY)
Troponin I (High Sensitivity): 3 ng/L (ref <18)
Troponin I (High Sensitivity): 2 ng/L (ref <18)

## 2024-03-27 LAB — GLUCOSE, CAPILLARY
Glucose-Capillary: 118 mg/dL — ABNORMAL HIGH (ref 70–99)
Glucose-Capillary: 109 mg/dL — ABNORMAL HIGH (ref 70–99)
Glucose-Capillary: 99 mg/dL (ref 70–99)
Glucose-Capillary: 103 mg/dL — ABNORMAL HIGH (ref 70–99)

## 2024-03-27 LAB — HIV ANTIBODY (ROUTINE TESTING W REFLEX): HIV Screen 4th Generation wRfx: NONREACTIVE

## 2024-03-27 LAB — POC URINE PREG, ED: Preg Test, Ur: NEGATIVE

## 2024-03-27 LAB — PHOSPHORUS: Phosphorus: 4.3 mg/dL (ref 2.5–4.6)

## 2024-03-27 MED ORDER — DULOXETINE HCL 60 MG PO CPEP
60.0000 mg | ORAL_CAPSULE | Freq: Every day | ORAL | Status: DC
  Administered 2024-03-27: 60 mg via ORAL
  Filled 2024-03-27: qty 1

## 2024-03-27 MED ORDER — ONDANSETRON HCL 4 MG/2ML IJ SOLN: 4.0000 mg | Freq: Four times a day (QID) | INTRAMUSCULAR | Status: DC | PRN

## 2024-03-27 MED ORDER — FENOFIBRATE 54 MG PO TABS
54.0000 mg | ORAL_TABLET | Freq: Every day | ORAL | Status: DC
  Administered 2024-03-27 – 2024-03-28 (×2): 54 mg via ORAL
  Filled 2024-03-27 (×3): qty 1

## 2024-03-27 MED ORDER — ACETAMINOPHEN 650 MG RE SUPP: 650.0000 mg | Freq: Four times a day (QID) | RECTAL | Status: DC | PRN

## 2024-03-27 MED ORDER — ICOSAPENT ETHYL 1 G PO CAPS
2.0000 g | ORAL_CAPSULE | Freq: Two times a day (BID) | ORAL | Status: DC
  Administered 2024-03-27 – 2024-03-28 (×3): 2 g via ORAL
  Filled 2024-03-27 (×3): qty 2

## 2024-03-27 MED ORDER — ACETAMINOPHEN 325 MG PO TABS
650.0000 mg | ORAL_TABLET | Freq: Four times a day (QID) | ORAL | Status: DC | PRN
Start: 2024-03-27 — End: 2024-03-28

## 2024-03-27 MED ORDER — ONDANSETRON HCL 4 MG PO TABS: 4.0000 mg | ORAL_TABLET | Freq: Four times a day (QID) | ORAL | Status: DC | PRN

## 2024-03-27 MED ORDER — RANOLAZINE ER 500 MG PO TB12
1000.0000 mg | ORAL_TABLET | Freq: Two times a day (BID) | ORAL | Status: DC
  Administered 2024-03-27 – 2024-03-28 (×3): 1000 mg via ORAL
  Filled 2024-03-27 (×3): qty 2

## 2024-03-27 MED ORDER — PREGABALIN 75 MG PO CAPS
150.0000 mg | ORAL_CAPSULE | Freq: Three times a day (TID) | ORAL | Status: DC
  Administered 2024-03-28: 150 mg via ORAL
  Filled 2024-03-27: qty 2

## 2024-03-27 MED ORDER — POTASSIUM CHLORIDE CRYS ER 20 MEQ PO TBCR
40.0000 meq | EXTENDED_RELEASE_TABLET | Freq: Once | ORAL | Status: AC
  Administered 2024-03-27: 40 meq via ORAL
  Filled 2024-03-27: qty 2

## 2024-03-27 MED ORDER — AMLODIPINE BESYLATE 5 MG PO TABS: 5.0000 mg | ORAL_TABLET | Freq: Every day | ORAL | Status: DC

## 2024-03-27 MED ORDER — INSULIN ASPART 100 UNIT/ML IJ SOLN: 0.0000 [IU] | Freq: Three times a day (TID) | INTRAMUSCULAR | Status: DC

## 2024-03-27 MED ORDER — PERFLUTREN LIPID MICROSPHERE
1.0000 mL | INTRAVENOUS | Status: AC | PRN
  Administered 2024-03-27: 2 mL via INTRAVENOUS

## 2024-03-27 MED ORDER — ROSUVASTATIN CALCIUM 20 MG PO TABS
40.0000 mg | ORAL_TABLET | Freq: Every day | ORAL | Status: DC
  Administered 2024-03-27 – 2024-03-28 (×2): 40 mg via ORAL
  Filled 2024-03-27 (×2): qty 2

## 2024-03-27 MED ORDER — AMLODIPINE BESYLATE 5 MG PO TABS
2.5000 mg | ORAL_TABLET | Freq: Every day | ORAL | Status: DC
  Administered 2024-03-27 – 2024-03-28 (×2): 2.5 mg via ORAL
  Filled 2024-03-27 (×2): qty 1

## 2024-03-27 MED ORDER — ENOXAPARIN SODIUM 80 MG/0.8ML IJ SOSY
80.0000 mg | PREFILLED_SYRINGE | INTRAMUSCULAR | Status: DC
  Administered 2024-03-27 – 2024-03-28 (×2): 80 mg via SUBCUTANEOUS
  Filled 2024-03-27 (×2): qty 0.8

## 2024-03-27 MED ORDER — EZETIMIBE 10 MG PO TABS
10.0000 mg | ORAL_TABLET | Freq: Every day | ORAL | Status: DC
  Administered 2024-03-27 – 2024-03-28 (×2): 10 mg via ORAL
  Filled 2024-03-27 (×2): qty 1

## 2024-03-27 MED ORDER — TRAMADOL HCL 50 MG PO TABS
100.0000 mg | ORAL_TABLET | Freq: Two times a day (BID) | ORAL | Status: DC
  Administered 2024-03-27 – 2024-03-28 (×2): 100 mg via ORAL
  Filled 2024-03-27 (×2): qty 2

## 2024-03-27 MED ORDER — BISOPROLOL FUMARATE 5 MG PO TABS
5.0000 mg | ORAL_TABLET | Freq: Two times a day (BID) | ORAL | Status: DC
  Administered 2024-03-27 – 2024-03-28 (×3): 5 mg via ORAL
  Filled 2024-03-27 (×3): qty 1

## 2024-03-27 MED ORDER — ASPIRIN 81 MG PO CHEW
81.0000 mg | CHEWABLE_TABLET | Freq: Every day | ORAL | Status: DC
  Administered 2024-03-27 – 2024-03-28 (×2): 81 mg via ORAL
  Filled 2024-03-27 (×2): qty 1

## 2024-03-27 MED ORDER — NITROGLYCERIN 0.4 MG SL SUBL
0.4000 mg | SUBLINGUAL_TABLET | SUBLINGUAL | Status: DC | PRN
  Administered 2024-03-27: 0.4 mg via SUBLINGUAL
  Filled 2024-03-27: qty 1

## 2024-03-27 NOTE — Progress Notes (Signed)
 Transition of Care Department [facility]) has reviewed patient and no other TOC needs have been identified at this time. We will continue to monitor patient advancement through interdisciplinary progression rounds. If new patient transition needs arise, please place a TOC consult.   03/27/24 0754  TOC Brief Assessment  Insurance and Status Reviewed  Patient has primary care physician Yes  Home environment has been reviewed Lives with husband and children.  Prior level of function: Independent.  Prior/Current Home Services No current home services  Social Drivers of Health Review SDOH reviewed no interventions necessary  Readmission risk has been reviewed Yes  Transition of care needs no transition of care needs at this time

## 2024-03-27 NOTE — ED Triage Notes (Signed)
 Pt bib [facility] c/o chest pressure and dizziness since yesterday. States she took a nitroglycerin  this morning and the pain went away. Pain came back tonight but pt did not want to take another nitroglycerin 

## 2024-03-27 NOTE — Progress Notes (Signed)
 Patient seen and evaluated, chart reviewed, please see EMR for updated orders. Please see full H&P dictated by admitting physician Dr Adefeso for same date of service.   Brief Summary:- 43 y.o. female with medical history significant of  , hypertension, hyperlipidemia, type 2 diabetes mellitus, morbid obesity, tobacco use  CAD s/p BMA to LAD with angioplasty of RI in 2013 and patent stent by repeat caths in 02/2012, 12/2013, and 02/2016. Most recent cath in 07/2018 showed patent mid-LAD stent with 99% D1 stenosis, 50% RCA, and 35% RI stenosis with medical management recommended admitted on 03/27/2024 with chest pains  A/p 1)Chest Pains--- -ruling out for ACS at this time -Cardiology consult appreciated -Continue aspirin , bisoprolol  and Crestor  as well as Ranexa  -Dr. Debera recommends observation overnight, cardiology make a decision in a.m. regarding possible repeat cardiac cath  2)Morbid Obesity- -Low calorie diet, portion control and increase physical activity discussed with patient -Body mass index is 54.34 kg/m.  3)HTN--continue amlodipine  2.5 mg daily, bisoprolol  5 mg twice daily  4)DM2--A1c 5.2 reflecting excellent diabetic control PTA Use Novolog /Humalog Sliding scale insulin  with Accu-Cheks/Fingersticks as ordered   5)Hyponatremia/hypokalemia--avoid dehydration, replace potassium Most likely due to torsemide  use -Hold torsemide  - Awaiting cardiology decision on possible LHC in a.m. -Keep n.p.o. overnight  Patient seen and evaluated, chart reviewed, please see EMR for updated orders. Please see full H&P dictated by admitting physician Dr Adefeso for same date of service.   Rendall Carwin, MD

## 2024-03-27 NOTE — Progress Notes (Signed)
*  PRELIMINARY RESULTS* Echocardiogram 2D Echocardiogram has been performed.  [name] 03/27/2024, 12:57 PM

## 2024-03-27 NOTE — Plan of Care (Signed)
   Problem: Education: Goal: Knowledge of General Education information will improve Description: Including pain rating scale, medication(s)/side effects and non-pharmacologic comfort measures Outcome: Progressing   Problem: Clinical Measurements: Goal: Ability to maintain clinical measurements within normal limits will improve Outcome: Progressing Goal: Diagnostic test results will improve Outcome: Progressing

## 2024-03-27 NOTE — Progress Notes (Signed)
 Mobility Specialist Progress Note:    03/27/24 1353  Mobility  Activity Ambulated with assistance in room;Transferred from bed to chair  Level of Assistance Independent  Assistive Device None  Distance Ambulated (ft) 15 ft  Range of Motion/Exercises Active;All extremities  Activity Response Tolerated well  Mobility Referral Yes  Mobility visit 1 Mobility  Mobility Specialist Start Time (ACUTE ONLY) 1334  Mobility Specialist Stop Time (ACUTE ONLY) 1353  Mobility Specialist Time Calculation (min) (ACUTE ONLY) 19 min   Pt received in bed, agreeable to mobility. Independently able to stand and ambulate with no AD. Tolerated well, asx throughout. Left in chair, call bell in reach. NT in room, all needs met.   Sherrilee Ditty Mobility Specialist Please contact via Special educational needs teacher or  Rehab office at [phone]

## 2024-03-27 NOTE — Consult Note (Addendum)
 Cardiology Consultation   Patient ID: OLETA GUNNOE MRN: [id number]; DOB: May 18, 1981  Admit date: 03/27/2024 Date of Consult: 03/27/2024  PCP:  Jolinda Norene HERO, DO   [facility] Providers Cardiologist:  Alvan Carrier, MD     Patient Profile: Shavonta Gossen Flanigan is a 43 y.o. female with a hx of CAD (s/p BMA to LAD with angioplasty of RI in 2013 and patent stent by repeat caths in 02/2012, 12/2013, and 02/2016; cath 07/2018: small D1 99% too small for PCI; cath 12/2019 DES to ramus; most recent cath 12/2022 showed ostial LAD 40%, mid LAD 99%, mid to distal LAD 95%, ostial D1 99%, ramus patent stent, LCx minimal LI's, RCA proximal to distal 50%, LAD not favorable for PCI, recommended medical management), HFpEF (2024 cath 55-65%), HTN, HLD, DM2, morbid obesity, tobacco use who is being seen 03/27/2024 for the evaluation of chest pain at the request of Dr. Pearlean.  History of Present Illness: Ms. Hansman was last seen in heart care OV 10/10/2023 with Dr. Alvan for follow-up.  No cardiac complaints and doing well from cardiac standpoint.  Noted to titrate Norvasc  or bisoprolol  if needed.  No med changes or additional recommendations.  Patient presents to [facility] ED for chest pressure and dizziness.  K3.2, CR 0.77, TN 3>2, WBC 11.6.  CXR without active disease.  EKG showed, NSR, HR 83, chronic anterior Q waves.  ED treated with NTG X1.   On interview, reports CP and dizziness since yesterday. CP described as pressure, 9/10, radiated down right arm, some relieve with laying on left side, no exacerbating factors and similar to previous CP episodes. Reports episode of CP around 6 am yesterday while sleeping that lasted 30-45 mins and resolved with NTG. Around 2:30 pm, started feeling lightheaded and BP was 88/71.  Had another CP episode around 10:30 pm but reports BP at home was borderline low so she did not take additional NTG but took 4 baby ASA. After about 1 hours, CP and dizziness Po  present then called EMS for transport. CP did not improve given NTG in ED. Associated with SOB, nausea, diaphoresis. She reports similar episode of CP over the past months x2 per week, however relieved with NTG. Currently, CP 3/10 and [place] dizzy. Reports chronic unchanged SOB with minimal exertion, relieved with rest. Sleeps with 4-5 pillows. Denies palpitations, edema, orthopnea, syncope. Smoke <1/2 PPD. Denies ETOH and drug use. Reports medication compliance except yesterday after taking NTG.    Past Medical History:  Diagnosis Date   Allergy    Anemia    Anginal pain (HCC)    Anxiety    Arthritis    CHF (congestive heart failure) (HCC)    Chronic asthmatic bronchitis (HCC) 08/17/2021   Chronic kidney disease    history of kidney stones   Coronary artery disease 09/2011   a.s/p BMS to LAD and angioplasty of RI in 09/2011 b. patent stent by cath in 02/2012, 12/2013, and 02/2016 with most recent showing 30-40% dLAD and 30-40% RCA stenosis   Depression    Diabetes mellitus    Dyspnea    GERD (gastroesophageal reflux disease)    History of kidney stones    Hyperlipidemia    Hypertension    Midsternal chest pain 03/03/2012   Migraines    Morbid obesity (HCC)    Myocardial infarct (HCC) 09/2011   Myocardial infarction (HCC) 02/2016   Neuromuscular disorder (HCC)    Palpitations 02/17/2012   Ureteral stone with hydronephrosis  Urinary tract obstruction due to kidney stone 02/15/2017    Past Surgical History:  Procedure Laterality Date   BIOPSY  10/27/2020   Procedure: BIOPSY;  Surgeon: Shaaron Lamar HERO, MD;  Location: [facility];  Service: Endoscopy;;   CARDIAC CATHETERIZATION  10/08/2011   LAD: 95% mid, Ramus: 95% ostial   CARPAL TUNNEL RELEASE     CESAREAN SECTION     CHOLECYSTECTOMY     CORONARY ANGIOPLASTY WITH STENT PLACEMENT  10/08/2011   LAD: BMS, Ramus: cutting balloon angioplasty   CORONARY STENT INTERVENTION N/A 01/14/2020   Procedure: CORONARY STENT  INTERVENTION;  Surgeon: Anner Alm ORN, MD;  Location: [facility] INVASIVE CV LAB;  Service: Cardiovascular;  Laterality: N/A;   CYSTOSCOPY WITH RETROGRADE PYELOGRAM, URETEROSCOPY AND STENT PLACEMENT Left 02/16/2017   Procedure: CYSTOSCOPY WITH RETROGRADE PYELOGRAM, URETEROSCOPY AND STENT PLACEMENT;  Surgeon: Ottelin, Mark, MD;  Location: [facility] ORS;  Service: Urology;  Laterality: Left;   CYSTOSCOPY WITH RETROGRADE PYELOGRAM, URETEROSCOPY AND STENT PLACEMENT Left 03/28/2017   Procedure: LEFT STENT REMOVAL LEFT RETROGRADE PYELOGRAM LEFT URETEROSCOPY   LASER LITHOTRIPSY  AND  STENT REPLACEMENT;  Surgeon: Sherrilee Belvie CROME, MD;  Location: [facility];  Service: Urology;  Laterality: Left;   CYSTOSCOPY WITH RETROGRADE PYELOGRAM, URETEROSCOPY AND STENT PLACEMENT Left 04/13/2022   Procedure: CYSTOSCOPY WITH RETROGRADE PYELOGRAM, URETEROSCOPY AND STENT PLACEMENT, BASKET EXTRACTION;  Surgeon: Sherrilee Belvie CROME, MD;  Location: [facility];  Service: Urology;  Laterality: Left;   ESOPHAGOGASTRODUODENOSCOPY (EGD) WITH PROPOFOL  N/A 10/27/2020   Rourk: mild erosive reflux esophagitis, h.pylori gastritis   LEFT HEART CATH AND CORONARY ANGIOGRAPHY N/A 08/15/2018   Procedure: LEFT HEART CATH AND CORONARY ANGIOGRAPHY;  Surgeon: Swaziland, Peter M, MD;  Location: [facility] INVASIVE CV LAB;  Service: Cardiovascular;  Laterality: N/A;   LEFT HEART CATH AND CORONARY ANGIOGRAPHY N/A 01/14/2020   Procedure: LEFT HEART CATH AND CORONARY ANGIOGRAPHY;  Surgeon: Anner Alm ORN, MD;  Location: [facility] INVASIVE CV LAB;  Service: Cardiovascular;  Laterality: N/A;   LEFT HEART CATH AND CORONARY ANGIOGRAPHY N/A 01/18/2023   Procedure: LEFT HEART CATH AND CORONARY ANGIOGRAPHY;  Surgeon: Anner Alm ORN, MD;  Location: [facility] INVASIVE CV LAB;  Service: Cardiovascular;  Laterality: N/A;   LEFT HEART CATHETERIZATION WITH CORONARY ANGIOGRAM N/A 10/08/2011   Procedure: LEFT HEART CATHETERIZATION WITH CORONARY ANGIOGRAM;  Surgeon: Peter M Swaziland, MD;  Location: [facility] CATH  LAB;  Service: Cardiovascular;  Laterality: N/A;   LEFT HEART CATHETERIZATION WITH CORONARY ANGIOGRAM N/A 03/02/2012   Procedure: LEFT HEART CATHETERIZATION WITH CORONARY ANGIOGRAM;  Surgeon: Lonni JONETTA Cash, MD;  Location: [facility] CATH LAB;  Service: Cardiovascular;  Laterality: N/A;   LEFT HEART CATHETERIZATION WITH CORONARY ANGIOGRAM N/A 01/04/2014   Procedure: LEFT HEART CATHETERIZATION WITH CORONARY ANGIOGRAM;  Surgeon: Debby DELENA Sor, MD;  Location: [facility] CATH LAB;  Service: Cardiovascular;  Laterality: N/A;   right hand pinky surgery       Home Medications:  Prior to Admission medications   Medication Sig Start Date End Date Taking? Authorizing Provider  Accu-Chek Softclix Lancets lancets CHECK BOOD SUGAR FOUR TIMES DAILY Dx E11.9 08/29/23   Jolinda Potter M, DO  acetaminophen  (TYLENOL ) 500 MG tablet Take 1,000 mg by mouth every 6 (six) hours as needed for moderate pain.    [provider]  albuterol  (VENTOLIN  HFA) 108 (90 Base) MCG/ACT inhaler Inhale 2 puffs into the lungs every 4 (four) hours as needed for wheezing or shortness of breath. 03/13/24   Jolinda Potter HERO, DO  amLODipine  (NORVASC ) 5  MG tablet TAKE 1 TABLET BY MOUTH DAILY 12/12/23   Alvan Dorn FALCON, MD  aspirin  81 MG chewable tablet Chew 81 mg by mouth daily.    [provider]  bisoprolol  (ZEBETA ) 5 MG tablet TAKE 1 TABLET BY MOUTH TWICE DAILY 02/13/24   Alvan Dorn FALCON, MD  blood glucose meter kit and supplies Dispense based on patient and insurance preference. Use up to four times daily as directed. (FOR ICD-10:  E11.22). Check BG daily 06/26/18   Jolinda Potter M, DO  Blood Glucose Monitoring Suppl (ACCU-CHEK GUIDE) w/Device KIT Test BS 4 times daily Dx E11.9 05/14/22   Jolinda Potter M, DO  budesonide -formoterol  (SYMBICORT ) 160-4.5 MCG/ACT inhaler INHALE TWO PUFFS first thing IN THE MORNING AND THEN INHALE another 2 PUFFS about 12 HOURS LATER 01/16/24   Darlean Ozell NOVAK, MD  busPIRone  (BUSPAR )  10 MG tablet Take 1 tablet (10 mg total) by mouth 2 (two) times daily. 03/13/24   Jolinda Potter HERO, DO  clopidogrel  (PLAVIX ) 75 MG tablet TAKE 1 TABLET BY MOUTH EVERY DAY 01/16/24   Alvan Dorn FALCON, MD  cyclobenzaprine  (FLEXERIL ) 10 MG tablet Take 1 tablet (10 mg total) by mouth at bedtime as needed for muscle spasms. 01/02/24   Lovorn, Megan, MD  DULoxetine  (CYMBALTA ) 60 MG capsule Take 1 capsule (60 mg total) by mouth at bedtime. 01/02/24   Lovorn, Megan, MD  Evolocumab  (REPATHA  SURECLICK) 140 MG/ML SOAJ INJECT 140 MG UNDER THE SKIN EVERY 14 DAYS 11/14/23   Jolinda Potter M, DO  ezetimibe  (ZETIA ) 10 MG tablet TAKE 1 TABLET BY MOUTH EVERY DAY 11/14/23   Alvan Dorn FALCON, MD  fenofibrate  (TRICOR ) 48 MG tablet TAKE 1 TABLET BY MOUTH DAILY 03/12/24   Jolinda Potter M, DO  FLUoxetine  (PROZAC ) 20 MG capsule TAKE ONE CAPSULE BY MOUTH EVERY DAY GIVE WITH CYMBALTA  DUE TO NERVE PAIN AND ANXIETY/DEPRESSION 12/12/23   Lovorn, Megan, MD  glucose blood (ACCU-CHEK GUIDE TEST) test strip CHECK BOOD SUGAR FOUR TIMES DAILY Dx E11.9 08/29/23   Jolinda Potter M, DO  icosapent  Ethyl (VASCEPA ) 1 g capsule TAKE TWO CAPSULES BY MOUTH TWICE DAILY 01/16/24   Alvan Dorn FALCON, MD  losartan  (COZAAR ) 25 MG tablet TAKE 1 TABLET BY MOUTH DAILY 11/14/23   Alvan Dorn FALCON, MD  medroxyPROGESTERone  (PROVERA ) 5 MG tablet TAKE 1 TABLET BY MOUTH DAILY 12/12/23   Jolinda Potter M, DO  metFORMIN  (GLUCOPHAGE ) 1000 MG tablet Take 1 tablet (1,000 mg total) by mouth daily with breakfast. 10/26/23   Jolinda Potter M, DO  nitroGLYCERIN  (NITROSTAT ) 0.4 MG SL tablet DISSOLVE 1 TABLET UNDER THE TONGUE EVERY 5 MINUTES AS NEEDED FOR CHEST PAIN. DO NOT EXCEED A TOTAL OF 3 DOSES IN 15 MINUTES. 01/19/22   Branch, Dorn FALCON, MD  OZEMPIC , 1 MG/DOSE, 4 MG/3ML SOPN INJECT 1 MG WEEKLY (TO REPLACE 0.5MG  OZEMPIC ) 12/12/23   Jolinda Potter HERO, DO  potassium chloride  SA (KLOR-CON  M) 20 MEQ tablet TAKE 1 TABLET BY MOUTH EVERY DAY AS NEEDED (when  taking torsemide ) 10/21/23   Alvan Dorn FALCON, MD  pregabalin  (LYRICA ) 150 MG capsule TAKE ONE CAPSULE BY MOUTH THREE TIMES DAILY 12/26/23   Lovorn, Megan, MD  ranolazine  (RANEXA ) 1000 MG SR tablet TAKE 1 TABLET BY MOUTH TWICE DAILY 01/16/24   Alvan Dorn FALCON, MD  rosuvastatin  (CRESTOR ) 40 MG tablet Take 1 tablet (40 mg total) by mouth daily. 03/13/24   Jolinda Potter HERO, DO  torsemide  (DEMADEX ) 20 MG tablet TAKE 2 TABLETS BY MOUTH DAILY AS  NEEDED FOR swelling 02/13/24   Alvan Dorn FALCON, MD  traMADol  (ULTRAM ) 50 MG tablet TAKE 2 TABLETS BY MOUTH TWICE DAILY 03/12/24   Thomas, Eunice L, NP    Scheduled Meds:  aspirin   81 mg Oral Daily   enoxaparin  (LOVENOX ) injection  80 mg Subcutaneous Q24H   ezetimibe   10 mg Oral Daily   fenofibrate   54 mg Oral Daily   icosapent  Ethyl  2 g Oral BID   insulin  aspart  0-15 Units Subcutaneous TID WC   rosuvastatin   40 mg Oral Daily    PRN Meds: acetaminophen  **OR** acetaminophen , nitroGLYCERIN , ondansetron  **OR** ondansetron  (ZOFRAN ) IV  Allergies:    Allergies  Allergen Reactions   Bee Venom Anaphylaxis and Swelling   Latex Itching   Misc. Sulfonamide Containing Compounds Nausea And Vomiting   Sulfa Drugs Cross Reactors Nausea And Vomiting    Social History:   Social History   Tobacco Use   Smoking status: Some Days    Current packs/day: 0.50    Average packs/day: 0.5 packs/day for 34.0 years (17.0 ttl pk-yrs)    Types: Cigarettes    Start date: 03/18/1990   Smokeless tobacco: Never   Tobacco comments:    Vapes currently   Substance Use Topics   Alcohol use: Not Currently    Alcohol/week: 0.0 standard drinks of alcohol    Comment: maybe once a year     Family History:   Family History  Problem Relation Age of Onset   Heart murmur Father    Diabetes Father    Hypertension Father    Hyperlipidemia Father    Cancer Father        throat   Diabetes Mother    Cancer Mother        breast.uterine, ovarian   Asthma Son     Appendicitis Son    Hernia Son    Mental illness Son        Bipolar, personality d/o   Stroke Maternal Grandmother    Cancer Maternal Grandmother        breast   Cancer Paternal Grandfather        lung   Colon cancer Neg Hx      ROS:  Please see the history of present illness.  All other ROS reviewed and negative.     Physical Exam/Data: Vitals:   03/27/24 0400 03/27/24 0500 03/27/24 0537 03/27/24 0546  BP: (!) 112/48 134/75 127/87 127/87  Pulse: 75 76 77 77  Resp: (!) 21 (!) 21 16 16   Temp:   98.1 F (36.7 C) 98.1 F (36.7 C)  TempSrc:   Oral Oral  SpO2: 96% 96% 97%   Weight:   (!) 171.8 kg (!) 171.8 kg  Height:    5' 10 (1.778 m)   No intake or output data in the 24 hours ending 03/27/24 0827    03/27/2024    5:46 AM 03/27/2024    5:37 AM 03/13/2024    9:58 AM  Last 3 Weights  Weight (lbs) 378 lb 12 oz 378 lb 12 oz 379 lb  Weight (kg) 171.8 kg 171.8 kg 171.913 kg     Body mass index is 54.34 kg/m.  General:  Well nourished, well developed, in no acute distress HEENT: normal Neck: no JVD Vascular: No carotid bruits; Distal pulses 2+ bilaterally Cardiac:  normal S1, S2; RRR; no murmur  Lungs:  clear to auscultation bilaterally, no wheezing, rhonchi or rales  Abd: soft, nontender, no hepatomegaly  Ext: no  edema Musculoskeletal:  No deformities, BUE and BLE strength normal and equal Skin: warm and dry  Neuro:  CNs 2-12 intact, no focal abnormalities noted Psych:  Normal affect   EKG:  The EKG was personally reviewed and demonstrates:  NSR, HR 83, chronic anterior Q waves.  Telemetry:  Telemetry was personally reviewed and demonstrates:  NSR, 70-80's  Relevant CV Studies: Cath 12/2022   Ost LAD lesion is 40% stenosed.   Previously placed proximal LAD bare-metal stent has roughly 10% restenosis.  The jailed caliber first diagonal branch has diffuse 99 % disease.   Prox LAD to Mid LAD lesion is 20% stenosed.   CULPRIT LESION SEGMENT: Distal LAD lesion is 99%  stenosed followed by a long segment of 95% stenosis.  Not favorable for PCI.   Previously placed Ost Ramus to Ramus drug-eluting stent was is widely patent.   Small caliber, nondominant RCA: Prox RCA to Dist RCA lesion is 50% stenosed.   ------------------------------------------------   The left ventricular systolic function is normal.  The left ventricular ejection fraction is 55-65% by visual estimate.   LV end diastolic pressure is normal.   There is no aortic valve stenosis.   POST CATH DIAGNOSES Widely patent proximal LAD stent and ostial to optimal RI DES stent. Culprit lesion is distal LAD subtotal occlusion involving the apical portion of the wraparound LAD. Very long segment and the small caliber of also. Not favorable for PCI-recommend medical management. Otherwise normal LV function and LVEDP. No obvious wall motion normality.      RECOMMENDATIONS Continue aggressive medical of anginal symptoms.  I have added Imdur  30 mg daily to Ranexa , bisoprolol  and amlodipine . Will review with interventional colleagues, however I do not think that this would be a favorable lesion to tackle percutaneously based on the length of the lesion and the size the downstream vessel.  Previous images to indicate that is a relatively sizable vessel, but now it appears to be less than 2 mm.  The distance is probably at least 30 if not 40 mm. Diagnostic Dominance: Left      Laboratory Data: High Sensitivity Troponin:   Recent Labs  Lab 03/27/24 0121 03/27/24 0316  TROPONINIHS 3 2     Chemistry Recent Labs  Lab 03/27/24 0121 03/27/24 0316  NA 134*  --   K 3.2*  --   CL 102  --   CO2 20*  --   GLUCOSE 119*  --   BUN 15  --   CREATININE 0.77  --   CALCIUM  9.4  --   MG  --  1.8  GFRNONAA >60  --   ANIONGAP 12  --      Hematology Recent Labs  Lab 03/27/24 0121  WBC 11.6*  RBC 4.23  HGB 12.9  HCT 38.2  MCV 90.3  MCH 30.5  MCHC 33.8  RDW 12.3  PLT 282    Radiology/Studies:   DG Chest Port 1 View Result Date: 03/27/2024 CLINICAL DATA:  Chest pain with shortness of breath, right arm pain, headache and dizziness. EXAM: PORTABLE CHEST 1 VIEW COMPARISON:  May 14, 2021 FINDINGS: The heart size and mediastinal contours are within normal limits. Low lung volumes are noted with mild, stable elevation of the right hemidiaphragm. Both lungs are clear. The visualized skeletal structures are unremarkable. IMPRESSION: No active disease. Electronically Signed   By: Suzen Dials M.D.   On: 03/27/2024 01:54    Assessment and Plan: CAD s/p BMA to LAD with angioplasty  of RI in 2013 and patent stent by repeat caths in 02/2012, 12/2013, and 02/2016;  cath 07/2018: small D1 99% too small for PCI; cath 12/2019 DES to ramus;  most recent cath 12/2022 showed ostial LAD 40%, mid LAD 99%, mid to distal LAD 95%, ostial D1 99%, ramus patent stent, LCx minimal LI's, RCA proximal to distal 50%, LAD not favorable for PCI, recommended medical management Negative troponin and EKG without ischemic changes. However with typical presentation as above, extensive CAD history and ongoing tobacco use,  I am concerned for ischemia. We discussed cath as an option. Can also consider ECHO. Will discuss with MD.  Per Dr. Debera, for now we will do medical management and titrate angina medications. Order Echo. Can reassess. Ordered home meds: Bisoprolol  5 mg BID, Amlodipine  2.5 mg daily, Ranexa  1000 mg BID  Continue on ASA 81 mg, Crestor  40 mg, Zetia  10 mg, fenofibrate  54 mg, Vascepa  2G twice daily Previously noted, did not tolerate Imdur  in the past and is on highest Ranexa  dose.  Due to disease burden have elected for indefinite DAPT.  Chronic HFpEF Echo 02/2016 [place] LVEF 50%, apical akinesis  4/ 2024 cath 55-65% Reports chronic unchanged SOB with minimal exertion, relieved with rest. Sleeps with 4-5 pillows.  Appears euvolemic. No need for diuresis.  Ordered home meds: Bisoprolol  5 mg BID, amlodipine   2.5 mg  HTN BP well-controlled: 127/87 Ordered home meds: Bisoprolol  5 mg BID, Amlodipine  2.5 mg  Can additional home medications as BP allows: Losartan  25 mg daily   Hypokalemia K 3.2. Treated with potassium supplement.  Continue to follow  Risk Assessment/Risk Scores: TIMI Risk Score for Unstable Angina or Non-ST Elevation MI:   The patient's TIMI risk score is 4, which indicates a 20% risk of all cause mortality, new or recurrent myocardial infarction or need for urgent revascularization in the next 14 days.{  [place]  Heart Association (NYHA) Functional Class NYHA Class III    For questions or updates, please contact [facility] Please consult [email or web address] for contact info under    Signed, Lorette CINDERELLA Kapur, PA-C  03/27/2024 8:27 AM    Attending note:  Patient seen and examined.  I reviewed her records and discussed the case with Ms. Dunlap PA-C.  I met with the patient and also spoke with her husband via speaker phone.  Cardiac history includes BMS to LAD in 2013, subsequent DES to ramus in 2021, and follow-up cardiac catheterization in April 2024 demonstrating patent LAD and ramus stent sites however branch vessel and distal LAD disease that was not favorable for PCI with recommendation to optimize medical therapy.  She did not tolerate Imdur  in the past.  Most recent regimen includes Ranexa , Norvasc , bisoprolol , Plavix , losartan , and lipid-lowering agents.  She reports compliance with therapy and indicates regular angina on a weekly basis that resolved with a single nitroglycerin .  She has been under a lot of stress recently, also working in the heat.  Within the last 24 hours she has had 3 separate episodes of chest pressure at rest, improved with nitroglycerin , also reportedly associated with interval hypotension and diaphoresis.  Cardiac enzymes are normal and ECG shows chronic changes, no acute ST segment abnormalities.  On examination she reports no active  symptoms.  She is afebrile, heart rate in the 70s in sinus rhythm by telemetry which I reviewed.  Recent systolics 120s to 130s.  Lungs are clear.  Cardiac exam with RRR no gallop.  Pertinent lab work includes potassium 3.2,  creatinine 0.77 with GFR greater than 60, high-sensitivity troponin I levels of 3 and 2 respectively, hemoglobin 12.9, WBC 11.6, platelets 282, hemoglobin A1c 5.2%, urine pregnancy negative.  ECG shows sinus rhythm with poor R wave progression consistent with prior anterior wall infarct as before.  Chest x-ray reports no active process.  Patient presents with chest discomfort concerning for unstable angina, although cardiac enzymes are normal and her ECG shows no acute ST segment change.  Cardiac catheterization from last year did not show clear PCI options given diffuse LAD disease and otherwise branch vessel disease, previously placed stents were patent.  Question at this point however is whether she has developed a de novo stenosis which may be amenable to PCI.  She reports compliance with a good anginal regimen.  Situation discussed with the patient and her husband, at this point they prefer to hold off on cardiac catheterization if possible.  We will plan to reinstitute her medications and titrate as able although recent blood pressures have been lower.  Obtain echocardiogram and observe today.  If she continues to have recurring chest discomfort, would favor a diagnostic cardiac catheterization.  Jayson JUDITHANN Sierras, M.D., F.A.C.C.

## 2024-03-27 NOTE — H&P (Signed)
 History and Physical    Patient: [name] FMW:[id number] DOB: January 06, 1981 DOA: 03/27/2024 DOS: the patient was seen and examined on 03/27/2024 PCP: Jolinda Norene HERO, DO  Patient coming from: Home  Chief Complaint:  Chief Complaint  Patient presents with   Chest Pain   HPI: [name] is a 43 y.o. female with medical history significant of  , hypertension, hyperlipidemia, type 2 diabetes mellitus, morbid obesity, tobacco use  CAD s/p BMA to LAD with angioplasty of RI in 2013 and patent stent by repeat caths in 02/2012, 12/2013, and 02/2016. Most recent cath in 07/2018 showed patent mid-LAD stent with 99% D1 stenosis, 50% RCA, and 35% RI stenosis with medical management recommended.  She complained of mid-sternal chest pain described as pressure-like which started yesterday in the morning.  Chest pain was rated as 10/10 on pain scale and was nonradiating, it resolved with nitroglycerin .  She had another episode of chest pain in the evening around 5:30 PM -6 pm, but due to BP being soft she did not take any additional nitroglycerin , but took 4 baby aspirin  and activated EMS that brought her to the ED for further evaluation and management.  ED Course:  In the emergency department, she was hemodynamically stable, though BP was soft at 108/66, workup in the ED showed normal CBC except for WBC of 11.6, BMP was normal except for sodium 134 potassium 3.2, bicarb 20, blood glucose 119, troponin x 2 was normal. Chest x-ray showed no acute disease She was treated with nitroglycerin  0.4 mg sublingual.  TRH was asked to admit patient  Review of Systems: Review of systems as noted in the HPI. All other systems reviewed and are negative.   Past Medical History:  Diagnosis Date   Allergy    Anemia    Anginal pain (HCC)    Anxiety    Arthritis    CHF (congestive heart failure) (HCC)    Chronic asthmatic bronchitis (HCC) 08/17/2021   Chronic kidney disease    history of kidney stones    Coronary artery disease 09/2011   a.s/p BMS to LAD and angioplasty of RI in 09/2011 b. patent stent by cath in 02/2012, 12/2013, and 02/2016 with most recent showing 30-40% dLAD and 30-40% RCA stenosis   Depression    Diabetes mellitus    Dyspnea    GERD (gastroesophageal reflux disease)    History of kidney stones    Hyperlipidemia    Hypertension    Midsternal chest pain 03/03/2012   Migraines    Morbid obesity (HCC)    Myocardial infarct (HCC) 09/2011   Myocardial infarction (HCC) 02/2016   Neuromuscular disorder (HCC)    Palpitations 02/17/2012   Ureteral stone with hydronephrosis    Urinary tract obstruction due to kidney stone 02/15/2017   Past Surgical History:  Procedure Laterality Date   BIOPSY  10/27/2020   Procedure: BIOPSY;  Surgeon: Shaaron Lamar HERO, MD;  Location: [facility];  Service: Endoscopy;;   CARDIAC CATHETERIZATION  10/08/2011   LAD: 95% mid, Ramus: 95% ostial   CARPAL TUNNEL RELEASE     CESAREAN SECTION     CHOLECYSTECTOMY     CORONARY ANGIOPLASTY WITH STENT PLACEMENT  10/08/2011   LAD: BMS, Ramus: cutting balloon angioplasty   CORONARY STENT INTERVENTION N/A 01/14/2020   Procedure: CORONARY STENT INTERVENTION;  Surgeon: Anner Alm ORN, MD;  Location: [facility] INVASIVE CV LAB;  Service: Cardiovascular;  Laterality: N/A;   CYSTOSCOPY WITH RETROGRADE PYELOGRAM, URETEROSCOPY AND STENT PLACEMENT  Left 02/16/2017   Procedure: CYSTOSCOPY WITH RETROGRADE PYELOGRAM, URETEROSCOPY AND STENT PLACEMENT;  Surgeon: Ottelin, Mark, MD;  Location: [facility];  Service: Urology;  Laterality: Left;   CYSTOSCOPY WITH RETROGRADE PYELOGRAM, URETEROSCOPY AND STENT PLACEMENT Left 03/28/2017   Procedure: LEFT STENT REMOVAL LEFT RETROGRADE PYELOGRAM LEFT URETEROSCOPY   LASER LITHOTRIPSY  AND  STENT REPLACEMENT;  Surgeon: Sherrilee Belvie CROME, MD;  Location: [facility];  Service: Urology;  Laterality: Left;   CYSTOSCOPY WITH RETROGRADE PYELOGRAM, URETEROSCOPY AND STENT PLACEMENT Left 04/13/2022    Procedure: CYSTOSCOPY WITH RETROGRADE PYELOGRAM, URETEROSCOPY AND STENT PLACEMENT, BASKET EXTRACTION;  Surgeon: Sherrilee Belvie CROME, MD;  Location: [facility];  Service: Urology;  Laterality: Left;   ESOPHAGOGASTRODUODENOSCOPY (EGD) WITH PROPOFOL  N/A 10/27/2020   Rourk: mild erosive reflux esophagitis, h.pylori gastritis   LEFT HEART CATH AND CORONARY ANGIOGRAPHY N/A 08/15/2018   Procedure: LEFT HEART CATH AND CORONARY ANGIOGRAPHY;  Surgeon: Swaziland, Peter M, MD;  Location: [facility] INVASIVE CV LAB;  Service: Cardiovascular;  Laterality: N/A;   LEFT HEART CATH AND CORONARY ANGIOGRAPHY N/A 01/14/2020   Procedure: LEFT HEART CATH AND CORONARY ANGIOGRAPHY;  Surgeon: Anner Alm ORN, MD;  Location: [facility] INVASIVE CV LAB;  Service: Cardiovascular;  Laterality: N/A;   LEFT HEART CATH AND CORONARY ANGIOGRAPHY N/A 01/18/2023   Procedure: LEFT HEART CATH AND CORONARY ANGIOGRAPHY;  Surgeon: Anner Alm ORN, MD;  Location: [facility] INVASIVE CV LAB;  Service: Cardiovascular;  Laterality: N/A;   LEFT HEART CATHETERIZATION WITH CORONARY ANGIOGRAM N/A 10/08/2011   Procedure: LEFT HEART CATHETERIZATION WITH CORONARY ANGIOGRAM;  Surgeon: Peter M Swaziland, MD;  Location: [facility] CATH LAB;  Service: Cardiovascular;  Laterality: N/A;   LEFT HEART CATHETERIZATION WITH CORONARY ANGIOGRAM N/A 03/02/2012   Procedure: LEFT HEART CATHETERIZATION WITH CORONARY ANGIOGRAM;  Surgeon: Lonni JONETTA Cash, MD;  Location: [facility] CATH LAB;  Service: Cardiovascular;  Laterality: N/A;   LEFT HEART CATHETERIZATION WITH CORONARY ANGIOGRAM N/A 01/04/2014   Procedure: LEFT HEART CATHETERIZATION WITH CORONARY ANGIOGRAM;  Surgeon: Debby DELENA Sor, MD;  Location: [facility] CATH LAB;  Service: Cardiovascular;  Laterality: N/A;   right hand pinky surgery      Social History:  reports that she has been smoking cigarettes. She started smoking about 34 years ago. She has a 17 pack-year smoking history. She has never used smokeless tobacco. She reports that she does not currently  use alcohol. She reports that she does not use drugs.   Allergies  Allergen Reactions   Bee Venom Anaphylaxis and Swelling   Latex Itching   Misc. Sulfonamide Containing Compounds Nausea And Vomiting   Sulfa Drugs Cross Reactors Nausea And Vomiting    Family History  Problem Relation Age of Onset   Heart murmur Father    Diabetes Father    Hypertension Father    Hyperlipidemia Father    Cancer Father        throat   Diabetes Mother    Cancer Mother        breast.uterine, ovarian   Asthma Son    Appendicitis Son    Hernia Son    Mental illness Son        Bipolar, personality d/o   Stroke Maternal Grandmother    Cancer Maternal Grandmother        breast   Cancer Paternal Grandfather        lung   Colon cancer Neg Hx      Prior to Admission medications   Medication Sig Start Date End Date Taking? Authorizing Provider  Accu-Chek  Softclix Lancets lancets CHECK BOOD SUGAR FOUR TIMES DAILY Dx E11.9 08/29/23   Jolinda Potter M, DO  acetaminophen  (TYLENOL ) 500 MG tablet Take 1,000 mg by mouth every 6 (six) hours as needed for moderate pain.    [provider]  albuterol  (VENTOLIN  HFA) 108 (90 Base) MCG/ACT inhaler Inhale 2 puffs into the lungs every 4 (four) hours as needed for wheezing or shortness of breath. 03/13/24   Jolinda Potter M, DO  amLODipine  (NORVASC ) 5 MG tablet TAKE 1 TABLET BY MOUTH DAILY 12/12/23   Alvan Dorn FALCON, MD  aspirin  81 MG chewable tablet Chew 81 mg by mouth daily.    [provider]  bisoprolol  (ZEBETA ) 5 MG tablet TAKE 1 TABLET BY MOUTH TWICE DAILY 02/13/24   Alvan Dorn FALCON, MD  blood glucose meter kit and supplies Dispense based on patient and insurance preference. Use up to four times daily as directed. (FOR ICD-10:  E11.22). Check BG daily 06/26/18   Jolinda Potter M, DO  Blood Glucose Monitoring Suppl (ACCU-CHEK GUIDE) w/Device KIT Test BS 4 times daily Dx E11.9 05/14/22   Jolinda Potter M, DO  budesonide -formoterol   (SYMBICORT ) 160-4.5 MCG/ACT inhaler INHALE TWO PUFFS first thing IN THE MORNING AND THEN INHALE another 2 PUFFS about 12 HOURS LATER 01/16/24   Darlean Ozell NOVAK, MD  busPIRone  (BUSPAR ) 10 MG tablet Take 1 tablet (10 mg total) by mouth 2 (two) times daily. 03/13/24   Jolinda Potter HERO, DO  clopidogrel  (PLAVIX ) 75 MG tablet TAKE 1 TABLET BY MOUTH EVERY DAY 01/16/24   Alvan Dorn FALCON, MD  cyclobenzaprine  (FLEXERIL ) 10 MG tablet Take 1 tablet (10 mg total) by mouth at bedtime as needed for muscle spasms. 01/02/24   Lovorn, Megan, MD  DULoxetine  (CYMBALTA ) 60 MG capsule Take 1 capsule (60 mg total) by mouth at bedtime. 01/02/24   Lovorn, Megan, MD  Evolocumab  (REPATHA  SURECLICK) 140 MG/ML SOAJ INJECT 140 MG UNDER THE SKIN EVERY 14 DAYS 11/14/23   Jolinda Potter M, DO  ezetimibe  (ZETIA ) 10 MG tablet TAKE 1 TABLET BY MOUTH EVERY DAY 11/14/23   Alvan Dorn FALCON, MD  fenofibrate  (TRICOR ) 48 MG tablet TAKE 1 TABLET BY MOUTH DAILY 03/12/24   Jolinda Potter M, DO  FLUoxetine  (PROZAC ) 20 MG capsule TAKE ONE CAPSULE BY MOUTH EVERY DAY GIVE WITH CYMBALTA  DUE TO NERVE PAIN AND ANXIETY/DEPRESSION 12/12/23   Lovorn, Megan, MD  glucose blood (ACCU-CHEK GUIDE TEST) test strip CHECK BOOD SUGAR FOUR TIMES DAILY Dx E11.9 08/29/23   Jolinda Potter M, DO  icosapent  Ethyl (VASCEPA ) 1 g capsule TAKE TWO CAPSULES BY MOUTH TWICE DAILY 01/16/24   Alvan Dorn FALCON, MD  losartan  (COZAAR ) 25 MG tablet TAKE 1 TABLET BY MOUTH DAILY 11/14/23   Alvan Dorn FALCON, MD  medroxyPROGESTERone  (PROVERA ) 5 MG tablet TAKE 1 TABLET BY MOUTH DAILY 12/12/23   Jolinda Potter M, DO  metFORMIN  (GLUCOPHAGE ) 1000 MG tablet Take 1 tablet (1,000 mg total) by mouth daily with breakfast. 10/26/23   Jolinda Potter M, DO  nitroGLYCERIN  (NITROSTAT ) 0.4 MG SL tablet DISSOLVE 1 TABLET UNDER THE TONGUE EVERY 5 MINUTES AS NEEDED FOR CHEST PAIN. DO NOT EXCEED A TOTAL OF 3 DOSES IN 15 MINUTES. 01/19/22   Branch, Dorn FALCON, MD  OZEMPIC , 1 MG/DOSE, 4 MG/3ML  SOPN INJECT 1 MG WEEKLY (TO REPLACE 0.5MG  OZEMPIC ) 12/12/23   Jolinda Potter HERO, DO  potassium chloride  SA (KLOR-CON  M) 20 MEQ tablet TAKE 1 TABLET BY MOUTH EVERY DAY AS NEEDED (when taking torsemide ) 10/21/23  Alvan Dorn FALCON, MD  pregabalin  (LYRICA ) 150 MG capsule TAKE ONE CAPSULE BY MOUTH THREE TIMES DAILY 12/26/23   Lovorn, Megan, MD  ranolazine  (RANEXA ) 1000 MG SR tablet TAKE 1 TABLET BY MOUTH TWICE DAILY 01/16/24   Alvan Dorn FALCON, MD  rosuvastatin  (CRESTOR ) 40 MG tablet Take 1 tablet (40 mg total) by mouth daily. 03/13/24   Jolinda Potter M, DO  torsemide  (DEMADEX ) 20 MG tablet TAKE 2 TABLETS BY MOUTH DAILY AS NEEDED FOR swelling 02/13/24   Alvan Dorn FALCON, MD  traMADol  (ULTRAM ) 50 MG tablet TAKE 2 TABLETS BY MOUTH TWICE DAILY 03/12/24   Debby Fidela CROME, NP    Physical Exam: BP 127/87   Pulse 77   Temp 98.1 F (36.7 C) (Oral)   Resp 16   Wt (!) 171.8 kg   SpO2 97%   BMI 54.34 kg/m   General: 43 y.o. year-old female well developed well nourished in no acute distress.  Alert and oriented x3. HEENT: NCAT, EOMI Neck: Supple, trachea medial Cardiovascular: Regular rate and rhythm with no rubs or gallops.  No thyromegaly or JVD noted.  No lower extremity edema. 2/4 pulses in all 4 extremities. Respiratory: Clear to auscultation with no wheezes or rales. Good inspiratory effort. Abdomen: Soft, nontender nondistended with normal bowel sounds x4 quadrants. Muskuloskeletal: Chest wall tenderness.  No cyanosis, clubbing or edema noted bilaterally Neuro: CN II-XII intact, strength 5/5 x 4, sensation, reflexes intact Skin: No ulcerative lesions noted or rashes Psychiatry: Judgement and insight appear normal. Mood is appropriate for condition and setting          Labs on Admission:  Basic Metabolic Panel: Recent Labs  Lab 03/27/24 0121  NA 134*  K 3.2*  CL 102  CO2 20*  GLUCOSE 119*  BUN 15  CREATININE 0.77  CALCIUM  9.4   Liver Function Tests: No results for  input(s): AST, ALT, ALKPHOS, BILITOT, PROT, ALBUMIN in the last 168 hours. No results for input(s): LIPASE, AMYLASE in the last 168 hours. No results for input(s): AMMONIA in the last 168 hours. CBC: Recent Labs  Lab 03/27/24 0121  WBC 11.6*  HGB 12.9  HCT 38.2  MCV 90.3  PLT 282   Cardiac Enzymes: No results for input(s): CKTOTAL, CKMB, CKMBINDEX, TROPONINI in the last 168 hours.  BNP (last 3 results) No results for input(s): BNP in the last 8760 hours.  ProBNP (last 3 results) No results for input(s): PROBNP in the last 8760 hours.  CBG: No results for input(s): GLUCAP in the last 168 hours.  Radiological Exams on Admission: DG Chest Port 1 View Result Date: 03/27/2024 CLINICAL DATA:  Chest pain with shortness of breath, right arm pain, headache and dizziness. EXAM: PORTABLE CHEST 1 VIEW COMPARISON:  May 14, 2021 FINDINGS: The heart size and mediastinal contours are within normal limits. Low lung volumes are noted with mild, stable elevation of the right hemidiaphragm. Both lungs are clear. The visualized skeletal structures are unremarkable. IMPRESSION: No active disease. Electronically Signed   By: Suzen Dials M.D.   On: 03/27/2024 01:54    EKG: I independently viewed the EKG done and my findings are as followed: Normal sinus rhythm at a rate of 83 bpm  Assessment/Plan Present on Admission:  Chest pain  Essential hypertension  Mixed hyperlipidemia  Tobacco abuse  Morbid obesity (HCC)  Coronary artery disease  Principal Problem:   Chest pain Active Problems:   Morbid obesity (HCC)   Tobacco abuse   Mixed hyperlipidemia  Diabetes mellitus treated with injections of non-insulin  medication (HCC)   Coronary artery disease   Essential hypertension   Hypokalemia  Chest pain rule out ACS Continue telemetry  Troponins x2 - 3 > 2 EKG showed normal sinus rhythm at a rate of 83 bpm Cardiology will be consulted to help decide if  Stress test is needed in am Versus other  diagnostic modalities.    Continue aspirin  81 mg p.o. daily, nitroglycerin  prn  Hypokalemia K+ 3.2, this will be replenished  Essential hypertension BP meds will be held at this time due to soft BP  Mixed hyperlipidemia Continue Crestor , Vascepa , fenofibrate  and Zetia   T2DM Hemoglobin A1c on 03/13/2024 was 5.6 per medical record Continue ISS and hypoglycemia protocol  Morbid obesity (BMI 54.4) Diet and lifestyle medication Patient may need to follow-up with outpatient PCP for weight loss  CAD s/p stent placement Continue aspirin , Crestor    DVT prophylaxis: Lovenox   Code Status: Full code  Family Communication: None at bedside  Consults: Cardiology  Severity of Illness: The appropriate patient status for this patient is OBSERVATION. Observation status is judged to be reasonable and necessary in order to provide the required intensity of service to ensure the patient's safety. The patient's presenting symptoms, physical exam findings, and initial radiographic and laboratory data in the context of their medical condition is felt to place them at decreased risk for further clinical deterioration. Furthermore, it is anticipated that the patient will be medically stable for discharge from the hospital within 2 midnights of admission.   Author: Mosie Angus, DO 03/27/2024 5:44 AM  For on call review [email or web address].

## 2024-03-27 NOTE — ED Provider Notes (Signed)
 [facility]ENC[facility] DEPARTMENT AT [facility]  Provider Note  CSN: [id number] Arrival date & time: 03/27/24 0117  History Chief Complaint  Patient presents with   Chest Pain    [name] is a 43 y.o. female with history of CAD, s/p MI with stent has had episodes of angina/NSTEMI over the intervening years, most recent cath showed stenosis not amenable to PCI and so she is currently medical management only. She reports an episode of chest pain earlier in the day, resolved with NTG. Had another episode around midnight but reports BP at home was borderline low so she did not take any additional NTG.    Home Medications Prior to Admission medications   Medication Sig Start Date End Date Taking? Authorizing Provider  Accu-Chek Softclix Lancets lancets CHECK BOOD SUGAR FOUR TIMES DAILY Dx E11.9 08/29/23   Jolinda Potter M, DO  acetaminophen  (TYLENOL ) 500 MG tablet Take 1,000 mg by mouth every 6 (six) hours as needed for moderate pain.    [provider]  albuterol  (VENTOLIN  HFA) 108 (90 Base) MCG/ACT inhaler Inhale 2 puffs into the lungs every 4 (four) hours as needed for wheezing or shortness of breath. 03/13/24   Jolinda Potter HERO, DO  amLODipine  (NORVASC ) 5 MG tablet TAKE 1 TABLET BY MOUTH DAILY 12/12/23   Alvan Dorn FALCON, MD  aspirin  81 MG chewable tablet Chew 81 mg by mouth daily.    [provider]  bisoprolol  (ZEBETA ) 5 MG tablet TAKE 1 TABLET BY MOUTH TWICE DAILY 02/13/24   Alvan Dorn FALCON, MD  blood glucose meter kit and supplies Dispense based on patient and insurance preference. Use up to four times daily as directed. (FOR ICD-10:  E11.22). Check BG daily 06/26/18   Jolinda Potter M, DO  Blood Glucose Monitoring Suppl (ACCU-CHEK GUIDE) w/Device KIT Test BS 4 times daily Dx E11.9 05/14/22   Jolinda Potter M, DO  budesonide -formoterol  (SYMBICORT ) 160-4.5 MCG/ACT inhaler INHALE TWO PUFFS first thing IN THE MORNING AND THEN INHALE another 2  PUFFS about 12 HOURS LATER 01/16/24   Darlean Ozell NOVAK, MD  busPIRone  (BUSPAR ) 10 MG tablet Take 1 tablet (10 mg total) by mouth 2 (two) times daily. 03/13/24   Jolinda Potter HERO, DO  clopidogrel  (PLAVIX ) 75 MG tablet TAKE 1 TABLET BY MOUTH EVERY DAY 01/16/24   Alvan Dorn FALCON, MD  cyclobenzaprine  (FLEXERIL ) 10 MG tablet Take 1 tablet (10 mg total) by mouth at bedtime as needed for muscle spasms. 01/02/24   Lovorn, Megan, MD  DULoxetine  (CYMBALTA ) 60 MG capsule Take 1 capsule (60 mg total) by mouth at bedtime. 01/02/24   Lovorn, Megan, MD  Evolocumab  (REPATHA  SURECLICK) 140 MG/ML SOAJ INJECT 140 MG UNDER THE SKIN EVERY 14 DAYS 11/14/23   Jolinda Potter M, DO  ezetimibe  (ZETIA ) 10 MG tablet TAKE 1 TABLET BY MOUTH EVERY DAY 11/14/23   Alvan Dorn FALCON, MD  fenofibrate  (TRICOR ) 48 MG tablet TAKE 1 TABLET BY MOUTH DAILY 03/12/24   Jolinda Potter M, DO  FLUoxetine  (PROZAC ) 20 MG capsule TAKE ONE CAPSULE BY MOUTH EVERY DAY GIVE WITH CYMBALTA  DUE TO NERVE PAIN AND ANXIETY/DEPRESSION 12/12/23   Lovorn, Megan, MD  glucose blood (ACCU-CHEK GUIDE TEST) test strip CHECK BOOD SUGAR FOUR TIMES DAILY Dx E11.9 08/29/23   Jolinda Potter M, DO  icosapent  Ethyl (VASCEPA ) 1 g capsule TAKE TWO CAPSULES BY MOUTH TWICE DAILY 01/16/24   Alvan Dorn FALCON, MD  losartan  (COZAAR ) 25 MG tablet TAKE 1 TABLET BY MOUTH  DAILY 11/14/23   Alvan Dorn FALCON, MD  medroxyPROGESTERone  (PROVERA ) 5 MG tablet TAKE 1 TABLET BY MOUTH DAILY 12/12/23   Jolinda Potter M, DO  metFORMIN  (GLUCOPHAGE ) 1000 MG tablet Take 1 tablet (1,000 mg total) by mouth daily with breakfast. 10/26/23   Jolinda Potter M, DO  nitroGLYCERIN  (NITROSTAT ) 0.4 MG SL tablet DISSOLVE 1 TABLET UNDER THE TONGUE EVERY 5 MINUTES AS NEEDED FOR CHEST PAIN. DO NOT EXCEED A TOTAL OF 3 DOSES IN 15 MINUTES. 01/19/22   Alvan Dorn FALCON, MD  OZEMPIC , 1 MG/DOSE, 4 MG/3ML SOPN INJECT 1 MG WEEKLY (TO REPLACE 0.5MG  OZEMPIC ) 12/12/23   Jolinda Potter M, DO  potassium chloride  SA  (KLOR-CON  M) 20 MEQ tablet TAKE 1 TABLET BY MOUTH EVERY DAY AS NEEDED (when taking torsemide ) 10/21/23   Alvan Dorn FALCON, MD  pregabalin  (LYRICA ) 150 MG capsule TAKE ONE CAPSULE BY MOUTH THREE TIMES DAILY 12/26/23   Lovorn, Megan, MD  ranolazine  (RANEXA ) 1000 MG SR tablet TAKE 1 TABLET BY MOUTH TWICE DAILY 01/16/24   Alvan Dorn FALCON, MD  rosuvastatin  (CRESTOR ) 40 MG tablet Take 1 tablet (40 mg total) by mouth daily. 03/13/24   Jolinda Potter HERO, DO  torsemide  (DEMADEX ) 20 MG tablet TAKE 2 TABLETS BY MOUTH DAILY AS NEEDED FOR swelling 02/13/24   Alvan Dorn FALCON, MD  traMADol  (ULTRAM ) 50 MG tablet TAKE 2 TABLETS BY MOUTH TWICE DAILY 03/12/24   Debby Fidela CROME, NP     Allergies    Bee venom, Latex, Misc. sulfonamide containing compounds, and Sulfa drugs cross reactors   Review of Systems   Review of Systems Please see HPI for pertinent positives and negatives  Physical Exam BP (!) 112/48   Pulse 75   Temp 98.2 F (36.8 C) (Oral)   Resp (!) 21   SpO2 96%   Physical Exam Vitals and nursing note reviewed.  Constitutional:      Appearance: Normal appearance. She is obese.  HENT:     Head: Normocephalic and atraumatic.     Nose: Nose normal.     Mouth/Throat:     Mouth: Mucous membranes are moist.   Eyes:     Extraocular Movements: Extraocular movements intact.     Conjunctiva/sclera: Conjunctivae normal.    Cardiovascular:     Rate and Rhythm: Normal rate.  Pulmonary:     Effort: Pulmonary effort is normal.     Breath sounds: Normal breath sounds.  Chest:     Chest wall: Tenderness present.  Abdominal:     General: Abdomen is flat.     Palpations: Abdomen is soft.     Tenderness: There is no abdominal tenderness.   Musculoskeletal:        General: No swelling. Normal range of motion.     Cervical back: Neck supple.   Skin:    General: Skin is warm and dry.   Neurological:     General: No focal deficit present.     Mental Status: She is alert.    Psychiatric:        Mood and Affect: Mood normal.     ED Results / Procedures / Treatments   EKG EKG Interpretation Date/Time:  Tuesday March 27 2024 01:26:57 EDT Ventricular Rate:  83 PR Interval:  161 QRS Duration:  100 QT Interval:  405 QTC Calculation: 476 R Axis:   36  Text Interpretation: Sinus rhythm Anterior infarct, old No significant change since last tracing Confirmed by Roselyn Dunnings [phone]) on 03/27/2024 1:46:34 AM  Procedures Procedures  Medications Ordered in the ED Medications  nitroGLYCERIN  (NITROSTAT ) SL tablet 0.4 mg (0.4 mg Sublingual Given 03/27/24 0200)    Initial Impression and Plan  Patient with known CAD here with episodes of chest pain concerning for angina/ACS. EKG without ischemic changes. Will give NTG, took ASA at home. Check labs and CXR.   ED Course   Clinical Course as of 03/27/24 0433  Tue Mar 27, 2024  0205 CBC is unremarkable. I personally viewed the images from radiology studies and agree with radiologist interpretation: CXR is clear [CS]  0307 BMP is unremarkable. Initial Trop is neg.  [CS]  0418 Repeat Trop is neg. Patient reports pain improved with NTG. Will plan admission for unstable angina. Hospitalist paged.  [CS]  [phone] Spoke with Dr. Manfred, Hospitalist, who will evaluate for admission.  [CS]    Clinical Course User Index [CS] Roselyn Carlin NOVAK, MD     MDM Rules/Calculators/A&P Medical Decision Making Problems Addressed: Unstable angina [facility]): acute illness or injury  Amount and/or Complexity of Data Reviewed Labs: ordered. Decision-making details documented in ED Course. Radiology: ordered and independent interpretation performed. Decision-making details documented in ED Course. ECG/medicine tests: ordered and independent interpretation performed. Decision-making details documented in ED Course.  Risk Prescription drug management. Decision regarding hospitalization.     Final Clinical Impression(s) / ED  Diagnoses Final diagnoses:  Unstable angina [facility])    Rx / DC Orders ED Discharge Orders     None        Roselyn Carlin NOVAK, MD 03/27/24 [phone]

## 2024-03-28 DIAGNOSIS — I2510 Atherosclerotic heart disease of native coronary artery without angina pectoris: Secondary | ICD-10-CM | POA: Diagnosis not present

## 2024-03-28 DIAGNOSIS — E119 Type 2 diabetes mellitus without complications: Secondary | ICD-10-CM

## 2024-03-28 DIAGNOSIS — I25119 Atherosclerotic heart disease of native coronary artery with unspecified angina pectoris: Secondary | ICD-10-CM | POA: Diagnosis not present

## 2024-03-28 DIAGNOSIS — E876 Hypokalemia: Secondary | ICD-10-CM | POA: Diagnosis not present

## 2024-03-28 DIAGNOSIS — I10 Essential (primary) hypertension: Secondary | ICD-10-CM | POA: Diagnosis not present

## 2024-03-28 DIAGNOSIS — Z7985 Long-term (current) use of injectable non-insulin antidiabetic drugs: Secondary | ICD-10-CM | POA: Diagnosis not present

## 2024-03-28 DIAGNOSIS — R079 Chest pain, unspecified: Secondary | ICD-10-CM | POA: Diagnosis not present

## 2024-03-28 DIAGNOSIS — I5032 Chronic diastolic (congestive) heart failure: Secondary | ICD-10-CM | POA: Diagnosis not present

## 2024-03-28 DIAGNOSIS — E782 Mixed hyperlipidemia: Secondary | ICD-10-CM | POA: Diagnosis not present

## 2024-03-28 LAB — COMPREHENSIVE METABOLIC PANEL WITH GFR
ALT: 15 U/L (ref 0–44)
AST: 18 U/L (ref 15–41)
Albumin: 3.3 g/dL — ABNORMAL LOW (ref 3.5–5.0)
Alkaline Phosphatase: 45 U/L (ref 38–126)
Anion gap: 10 (ref 5–15)
BUN: 9 mg/dL (ref 6–20)
CO2: 21 mmol/L — ABNORMAL LOW (ref 22–32)
Calcium: 9 mg/dL (ref 8.9–10.3)
Chloride: 106 mmol/L (ref 98–111)
Creatinine, Ser: 0.67 mg/dL (ref 0.44–1.00)
GFR, Estimated: >60 mL/min (ref >60)
Glucose, Bld: 91 mg/dL (ref 70–99)
Potassium: 3.5 mmol/L (ref 3.5–5.1)
Sodium: 137 mmol/L (ref 135–145)
Total Bilirubin: 0.8 mg/dL (ref 0.0–1.2)
Total Protein: 7.1 g/dL (ref 6.5–8.1)

## 2024-03-28 LAB — GLUCOSE, CAPILLARY
Glucose-Capillary: 104 mg/dL — ABNORMAL HIGH (ref 70–99)
Glucose-Capillary: 101 mg/dL — ABNORMAL HIGH (ref 70–99)

## 2024-03-28 LAB — CBC
HCT: 38.6 % (ref 36.0–46.0)
Hemoglobin: 12.9 g/dL (ref 12.0–15.0)
MCH: 31.2 pg (ref 26.0–34.0)
MCHC: 33.4 g/dL (ref 30.0–36.0)
MCV: 93.5 fL (ref 80.0–100.0)
Platelets: 232 10*3/uL (ref 150–400)
RBC: 4.13 MIL/uL (ref 3.87–5.11)
RDW: 12.3 % (ref 11.5–15.5)
WBC: 9.9 10*3/uL (ref 4.0–10.5)
nRBC: 0 % (ref 0.0–0.2)

## 2024-03-28 MED ORDER — AMLODIPINE BESYLATE 5 MG PO TABS
2.5000 mg | ORAL_TABLET | Freq: Every day | ORAL | Status: AC
  Administered 2024-03-28: 2.5 mg via ORAL
  Filled 2024-03-28: qty 1

## 2024-03-28 MED ORDER — AMLODIPINE BESYLATE 5 MG PO TABS: 5.0000 mg | ORAL_TABLET | Freq: Every day | ORAL | Status: DC

## 2024-03-28 NOTE — Progress Notes (Addendum)
 Rounding Note   Patient Name: [name] Date of Encounter: 03/28/2024  [facility] Cardiologist: Alvan Carrier, MD   Subjective Hang dizzy but relates to poor sleep. Denies any CP or SOB. She Flegel prefers to continue with medical management and avoid cath at this time.   Scheduled Meds:  amLODipine   2.5 mg Oral Daily   aspirin   81 mg Oral Daily   bisoprolol   5 mg Oral BID   DULoxetine   60 mg Oral QHS   enoxaparin  (LOVENOX ) injection  80 mg Subcutaneous Q24H   ezetimibe   10 mg Oral Daily   fenofibrate   54 mg Oral Daily   icosapent  Ethyl  2 g Oral BID   insulin  aspart  0-15 Units Subcutaneous TID WC   pregabalin   150 mg Oral TID   ranolazine   1,000 mg Oral BID   rosuvastatin   40 mg Oral Daily   traMADol   100 mg Oral BID    PRN Meds: acetaminophen  **OR** acetaminophen , nitroGLYCERIN , ondansetron  **OR** ondansetron  (ZOFRAN ) IV   Vital Signs  Vitals:   03/27/24 0900 03/27/24 1954 03/28/24 0323 03/28/24 0847  BP: 114/67 (!) 141/80 (!) 108/56 (!) 141/82  Pulse: 75 79 85   Resp:      Temp: 98.4 F (36.9 C) 98.3 F (36.8 C) 98.1 F (36.7 C)   TempSrc: Axillary Oral Oral   SpO2: 100% 99% 99%   Weight:      Height:        Intake/Output Summary (Last 24 hours) at 03/28/2024 0858 Last data filed at 03/27/2024 1700 Gross per 24 hour  Intake 720 ml  Output --  Net 720 ml      03/27/2024    5:46 AM 03/27/2024    5:37 AM 03/13/2024    9:58 AM  Last 3 Weights  Weight (lbs) 378 lb 12 oz 378 lb 12 oz 379 lb  Weight (kg) 171.8 kg 171.8 kg 171.913 kg      Telemetry NSR, HR 70-80's - Personally Reviewed  Physical Exam  GEN: Sitting upright in bed in No acute distress.   Neck: No JVD Cardiac: RRR, no murmurs, rubs, or gallops.  Respiratory: Clear to auscultation bilaterally. GI: Soft, nontender, non-distended  MS: No edema; No deformity. Neuro:  Nonfocal  Psych: Normal affect   Labs High Sensitivity Troponin:   Recent Labs  Lab 03/27/24 0121  03/27/24 0316  TROPONINIHS 3 2     Chemistry Recent Labs  Lab 03/27/24 0121 03/27/24 0316 03/28/24 0518  NA 134*  --  137  K 3.2*  --  3.5  CL 102  --  106  CO2 20*  --  21*  GLUCOSE 119*  --  91  BUN 15  --  9  CREATININE 0.77  --  0.67  CALCIUM  9.4  --  9.0  MG  --  1.8  --   PROT  --   --  7.1  ALBUMIN  --   --  3.3*  AST  --   --  18  ALT  --   --  15  ALKPHOS  --   --  45  BILITOT  --   --  0.8  GFRNONAA >60  --  >60  ANIONGAP 12  --  10     Hematology Recent Labs  Lab 03/27/24 0121 03/28/24 0518  WBC 11.6* 9.9  RBC 4.23 4.13  HGB 12.9 12.9  HCT 38.2 38.6  MCV 90.3 93.5  MCH 30.5 31.2  MCHC 33.8 33.4  RDW 12.3 12.3  PLT 282 232    Radiology  ECHOCARDIOGRAM COMPLETE Result Date: 03/27/2024    ECHOCARDIOGRAM REPORT   Patient Name:   [name] Date of Exam: 03/27/2024 Medical Rec #:  [id number]         Height:       70.0 in Accession #:    [id number]        Weight:       378.7 lb Date of Birth:  12/17/1980         BSA:          2.738 m Patient Age:    43 years          BP:           114/67 mmHg Patient Gender: F                 HR:           79 bpm. Exam Location:  [facility] Procedure: 2D Echo, Cardiac Doppler and Color Doppler (Both Spectral and Color            Flow Doppler were utilized during procedure). Indications:    R07.9* Chest pain, unspecified  History:        Patient has prior history of Echocardiogram examinations, most                 recent 03/27/2016.  Sonographer:    Eva Lash Referring Phys: [id number] SCOTESIA Y DUNLAP IMPRESSIONS  1. Left ventricular ejection fraction, by estimation, is 60 to 65%. The left ventricle has normal function. The left ventricle has no regional wall motion abnormalities. There is moderate concentric left ventricular hypertrophy. Left ventricular diastolic parameters were normal.  2. Right ventricular systolic function is normal. The right ventricular size is normal. Tricuspid regurgitation signal is inadequate for  assessing PA pressure.  3. The mitral valve is grossly normal. Trivial mitral valve regurgitation.  4. The aortic valve is grossly normal. Aortic valve regurgitation is not visualized.  5. The inferior vena cava is normal in size with greater than 50% respiratory variability, suggesting right atrial pressure of 3 mmHg. Comparison(s): Prior images reviewed side by side. LVEF 60-65% with moderate LVH. FINDINGS  Left Ventricle: Left ventricular ejection fraction, by estimation, is 60 to 65%. The left ventricle has normal function. The left ventricle has no regional wall motion abnormalities. Definity contrast agent was given IV to delineate the left ventricular  endocardial borders. The left ventricular internal cavity size was normal in size. There is moderate concentric left ventricular hypertrophy. Left ventricular diastolic parameters were normal. Right Ventricle: The right ventricular size is normal. No increase in right ventricular wall thickness. Right ventricular systolic function is normal. Tricuspid regurgitation signal is inadequate for assessing PA pressure. Left Atrium: Left atrial size was normal in size. Right Atrium: Right atrial size was normal in size. Pericardium: There is no evidence of pericardial effusion. Presence of epicardial fat layer. Mitral Valve: The mitral valve is grossly normal. Trivial mitral valve regurgitation. Tricuspid Valve: The tricuspid valve is grossly normal. Tricuspid valve regurgitation is trivial. Aortic Valve: The aortic valve is grossly normal. Aortic valve regurgitation is not visualized. Pulmonic Valve: The pulmonic valve was grossly normal. Pulmonic valve regurgitation is trivial. Aorta: The aortic root and ascending aorta are structurally normal, with no evidence of dilitation. Venous: The inferior vena cava is normal in size with greater than 50% respiratory variability, suggesting right atrial pressure of 3  mmHg. IAS/Shunts: No atrial level shunt detected by color  flow Doppler. Additional Comments: 3D was performed not requiring image post processing on an independent workstation and was indeterminate.  LEFT VENTRICLE PLAX 2D LVIDd:         3.90 cm      Diastology LVIDs:         2.70 cm      LV e' medial:    7.29 cm/s LV PW:         1.60 cm      LV E/e' medial:  10.9 LV IVS:        1.50 cm      LV e' lateral:   8.49 cm/s LVOT diam:     2.60 cm      LV E/e' lateral: 9.4 LVOT Area:     5.31 cm  LV Volumes (MOD) LV vol d, MOD A2C: 59.8 ml LV vol d, MOD A4C: 180.0 ml LV vol s, MOD A2C: 21.2 ml LV vol s, MOD A4C: 60.3 ml LV SV MOD A2C:     38.6 ml LV SV MOD A4C:     180.0 ml LV SV MOD BP:      85.7 ml RIGHT VENTRICLE RV S prime:     15.30 cm/s TAPSE (M-mode): 2.8 cm LEFT ATRIUM             Index LA diam:        5.40 cm 1.97 cm/m LA Vol (A2C):   94.7 ml 34.59 ml/m LA Vol (A4C):   76.4 ml 27.90 ml/m LA Biplane Vol: 86.4 ml 31.55 ml/m   AORTA Ao Asc diam: 3.60 cm MITRAL VALVE MV Area (PHT): 5.02 cm    SHUNTS MV Decel Time: 151 msec    Systemic Diam: 2.60 cm MV E velocity: 79.70 cm/s MV A velocity: 65.00 cm/s MV E/A ratio:  1.23 Jayson Sierras MD Electronically signed by Jayson Sierras MD Signature Date/Time: 03/27/2024/2:29:54 PM    Final    DG Chest Port 1 View Result Date: 03/27/2024 CLINICAL DATA:  Chest pain with shortness of breath, right arm pain, headache and dizziness. EXAM: PORTABLE CHEST 1 VIEW COMPARISON:  May 14, 2021 FINDINGS: The heart size and mediastinal contours are within normal limits. Low lung volumes are noted with mild, stable elevation of the right hemidiaphragm. Both lungs are clear. The visualized skeletal structures are unremarkable. IMPRESSION: No active disease. Electronically Signed   By: Suzen Dials M.D.   On: 03/27/2024 01:54    Patient Profile   43 y.o. female with a hx of CAD (s/p BMA to LAD with angioplasty of RI in 2013 and patent stent by repeat caths in 02/2012, 12/2013, and 02/2016; cath 07/2018: small D1 99% too small for PCI;  cath 12/2019 DES to ramus; most recent cath 12/2022 showed ostial LAD 40%, mid LAD 99%, mid to distal LAD 95%, ostial D1 99%, ramus patent stent, LCx minimal LI's, RCA proximal to distal 50%, LAD not favorable for PCI, recommended medical management), HFpEF (2024 cath 55-65%), HTN, HLD, DM2, morbid obesity, tobacco use who is being seen 03/27/2024 for the evaluation of chest pain at the request of Dr. Pearlean.   Assessment & Plan   CAD s/p BMA to LAD with angioplasty of RI in 2013 and patent stent by repeat caths in 02/2012, 12/2013, and 02/2016;  cath 07/2018: small D1 99% too small for PCI; cath 12/2019 DES to ramus;  most recent cath 12/2022 showed ostial LAD 40%, mid LAD 99%,  mid to distal LAD 95%, ostial D1 99%, ramus patent stent, LCx minimal LI's, RCA proximal to distal 50%, LAD not favorable for PCI, recommended medical management Negative troponin and EKG without ischemic changes.  ECHO 03/27/2024: EF 60-65%, no RWMA, moderate LVH, trivial MVR Per Dr. Debera, patient prefers medical management and titrate angina medications instead of cath if possible. This am, denies any CP and would prefer to continue this plan. ECHO was reassuring. Patient inquired about going home today. She is stable w/o any CP or SOB. Discussed continuing to follow BP at home. Will discuss with MD.  Continue on ASA 81 mg, Crestor  40 mg, Zetia  10 mg, fenofibrate  54 mg, Vascepa  2G twice daily, Bisoprolol  5 mg BID, Ranexa  1000 mg BID  Previously noted, did not tolerate Imdur  in the past and is on highest Ranexa  dose.  Due to disease burden have elected for indefinite DAPT.   Chronic HFpEF Echo 02/2016 [place] LVEF 50%, apical akinesis  4/ 2024 cath 55-65% Reports chronic unchanged SOB with minimal exertion, relieved with rest. Sleeps with 4-5 pillows. This am, denies any SOB.  Appears euvolemic. No need for diuresis.  Continue Bisoprolol  5 mg BID Continue to monitor BP at home. If SBP > 120, then can add losartan  25 mg back.     HTN BP mildly elevated: 141/82 Continue  Bisoprolol  5 mg BID Increase amlodipine  to 5 mg  Continue to monitor BP at home. If SBP > 120, then can add losartan  25 mg back.     Hypokalemia K 3.2. Treated with potassium supplement. This am K 3.5.  Continue to follow      For questions or updates, please contact [facility] Please consult [email or web address] for contact info under     Signed, Lorette CINDERELLA Kapur, PA-C  03/28/2024, 8:58 AM      Attending note:  Patient seen and examined.  Case discussed with Ms. Dunlap PA-C.  This Wyer reports no recurring chest discomfort under observation and would like to go home today.  She continues to prefer medical management of CAD with angina if possible, we have discussed potential cardiac catheterization if symptoms escalate and we will arrange office follow-up.  Interval vital signs reviewed, medical therapy was resumed yesterday at somewhat lower dose in terms of antihypertensive regimen based on presentation with relatively low blood pressures.  Pertinent lab work includes potassium 3.5, creatinine 0.67, hemoglobin 12.9, platelets 232.  Anticipate discharge home today.  We will arrange office follow-up for review of symptoms on medical therapy.  Would resume prior cardiac regimen as outpatient although hold on resuming losartan  until she continues to track blood pressure further at home.  Jayson JUDITHANN Debera, M.D., F.A.C.C.

## 2024-03-28 NOTE — Progress Notes (Signed)
 Reviewed discharge information with patient who verbalized understanding. IV and telemetry discontinued.

## 2024-03-28 NOTE — Discharge Summary (Addendum)
 Physician Discharge Summary  Fanchon Papania Drotar FMW:[id number] DOB: Dec 22, 1980 DOA: 03/27/2024  PCP: Jolinda Norene HERO, DO  Admit date: 03/27/2024 Discharge date: 03/28/2024  Admitted From:  Home  Disposition: Home   Recommendations for Outpatient Follow-up:  Follow up with PCP in 1 weeks Follow up with cardiology on 04/11/24 as scheduled  Discharge Condition: STABLE   CODE STATUS: FULL DIET: Heart Healthy foods recommended    Brief Hospitalization Summary: Please see all hospital notes, images, labs for full details of the hospitalization. Admission provider HPI:  43 y.o. female with medical history significant of  , hypertension, hyperlipidemia, type 2 diabetes mellitus, morbid obesity, tobacco use  CAD s/p BMA to LAD with angioplasty of RI in 2013 and patent stent by repeat caths in 02/2012, 12/2013, and 02/2016. Most recent cath in 07/2018 showed patent mid-LAD stent with 99% D1 stenosis, 50% RCA, and 35% RI stenosis with medical management recommended.  She complained of mid-sternal chest pain described as pressure-like which started yesterday in the morning.  Chest pain was rated as 10/10 on pain scale and was nonradiating, it resolved with nitroglycerin .  She had another episode of chest pain in the evening around 5:30 PM -6 pm, but due to BP being soft she did not take any additional nitroglycerin , but took 4 baby aspirin  and activated EMS that brought her to the ED for further evaluation and management.   ED Course:  In the emergency department, she was hemodynamically stable, though BP was soft at 108/66, workup in the ED showed normal CBC except for WBC of 11.6, BMP was normal except for sodium 134 potassium 3.2, bicarb 20, blood glucose 119, troponin x 2 was normal. Chest x-ray showed no acute disease She was treated with nitroglycerin  0.4 mg sublingual.  TRH was asked to admit patient  Hospital Course This patient was admitted to the hospital with chest pain with known history of  coronary artery disease.  She is ruled out for ACS.  Cardiology consulted while she was being observed in the hospital and had initially recommended for inpatient cardiac catheterization however patient has declined and preferred observation and medical management.  She was evaluated again this morning and reported that her symptoms have resolved and she feels stable and back to her baseline on medical treatment only and would like to discharge home today.  Cardiology saw her again today and confirm she did declined to pursue cardiac catheterization and would prefer medical management of her CAD and close outpatient follow-up.  She has an appointment with cardiology scheduled for 04/11/2024.  Discharge Diagnoses:  Principal Problem:   Chest pain Active Problems:   Morbid obesity (HCC)   Tobacco abuse   Mixed hyperlipidemia   Diabetes mellitus treated with injections of non-insulin  medication (HCC)   Coronary artery disease   Essential hypertension   Hypokalemia   Discharge Instructions:  Allergies as of 03/28/2024       Reactions   Bee Venom Anaphylaxis, Swelling   Misc. Sulfonamide Containing Compounds Nausea And Vomiting   Sulfa Drugs Cross Reactors Nausea And Vomiting   Latex Itching        Medication List     STOP taking these medications    losartan  25 MG tablet Commonly known as: COZAAR        TAKE these medications    Accu-Chek Guide Test test strip Generic drug: glucose blood CHECK BOOD SUGAR FOUR TIMES DAILY Dx E11.9   Accu-Chek Guide w/Device Kit Test BS 4 times daily Dx E11.9  Accu-Chek Softclix Lancets lancets CHECK BOOD SUGAR FOUR TIMES DAILY Dx E11.9   acetaminophen  500 MG tablet Commonly known as: TYLENOL  Take 1,000 mg by mouth every 6 (six) hours as needed for moderate pain.   albuterol  108 (90 Base) MCG/ACT inhaler Commonly known as: VENTOLIN  HFA Inhale 2 puffs into the lungs every 4 (four) hours as needed for wheezing or shortness of breath.    amLODipine  5 MG tablet Commonly known as: NORVASC  TAKE 1 TABLET BY MOUTH DAILY   aspirin  81 MG chewable tablet Chew 81 mg by mouth daily.   bisoprolol  5 MG tablet Commonly known as: ZEBETA  TAKE 1 TABLET BY MOUTH TWICE DAILY   blood glucose meter kit and supplies Dispense based on patient and insurance preference. Use up to four times daily as directed. (FOR ICD-10:  E11.22). Check BG daily   budesonide -formoterol  160-4.5 MCG/ACT inhaler Commonly known as: Symbicort  INHALE TWO PUFFS first thing IN THE MORNING AND THEN INHALE another 2 PUFFS about 12 HOURS LATER   busPIRone  10 MG tablet Commonly known as: BUSPAR  Take 1 tablet (10 mg total) by mouth 2 (two) times daily.   calcium  carbonate 500 MG chewable tablet Commonly known as: TUMS - dosed in mg elemental calcium  Chew 1 tablet by mouth daily as needed for indigestion or heartburn.   clopidogrel  75 MG tablet Commonly known as: PLAVIX  TAKE 1 TABLET BY MOUTH EVERY DAY   cyclobenzaprine  10 MG tablet Commonly known as: FLEXERIL  Take 1 tablet (10 mg total) by mouth at bedtime as needed for muscle spasms.   DULoxetine  60 MG capsule Commonly known as: CYMBALTA  Take 1 capsule (60 mg total) by mouth at bedtime.   ezetimibe  10 MG tablet Commonly known as: ZETIA  TAKE 1 TABLET BY MOUTH EVERY DAY   fenofibrate  48 MG tablet Commonly known as: TRICOR  TAKE 1 TABLET BY MOUTH DAILY   FLUoxetine  20 MG capsule Commonly known as: PROZAC  TAKE ONE CAPSULE BY MOUTH EVERY DAY GIVE WITH CYMBALTA  DUE TO NERVE PAIN AND ANXIETY/DEPRESSION   icosapent  Ethyl 1 g capsule Commonly known as: VASCEPA  TAKE TWO CAPSULES BY MOUTH TWICE DAILY   medroxyPROGESTERone  5 MG tablet Commonly known as: PROVERA  TAKE 1 TABLET BY MOUTH DAILY   metFORMIN  1000 MG tablet Commonly known as: GLUCOPHAGE  Take 1 tablet (1,000 mg total) by mouth daily with breakfast.   nitroGLYCERIN  0.4 MG SL tablet Commonly known as: NITROSTAT  DISSOLVE 1 TABLET UNDER THE  TONGUE EVERY 5 MINUTES AS NEEDED FOR CHEST PAIN. DO NOT EXCEED A TOTAL OF 3 DOSES IN 15 MINUTES.   Ozempic  (1 MG/DOSE) 4 MG/3ML Sopn Generic drug: Semaglutide  (1 MG/DOSE) INJECT 1 MG WEEKLY (TO REPLACE 0.5MG  OZEMPIC )   potassium chloride  SA 20 MEQ tablet Commonly known as: KLOR-CON  M TAKE 1 TABLET BY MOUTH EVERY DAY AS NEEDED (when taking torsemide )   pregabalin  150 MG capsule Commonly known as: LYRICA  TAKE ONE CAPSULE BY MOUTH THREE TIMES DAILY   ranolazine  1000 MG SR tablet Commonly known as: RANEXA  TAKE 1 TABLET BY MOUTH TWICE DAILY   Repatha  SureClick 140 MG/ML Soaj Generic drug: Evolocumab  INJECT 140 MG UNDER THE SKIN EVERY 14 DAYS   rosuvastatin  40 MG tablet Commonly known as: CRESTOR  Take 1 tablet (40 mg total) by mouth daily.   torsemide  20 MG tablet Commonly known as: DEMADEX  TAKE 2 TABLETS BY MOUTH DAILY AS NEEDED FOR swelling   traMADol  50 MG tablet Commonly known as: ULTRAM  TAKE 2 TABLETS BY MOUTH TWICE DAILY  Follow-up Information     Sheron Lorette GRADE, PA-C Follow up on 04/11/2024.   Specialties: Physician Assistant, Cardiology Why: Follow up on 04/11/2024 at 9:30 pm with Lorette (Scottie) Sheron. Appointment says with Laymon Qua but will see Scottie. Contact information: [address] [place] [address] [phone]                Allergies  Allergen Reactions   Bee Venom Anaphylaxis and Swelling   Misc. Sulfonamide Containing Compounds Nausea And Vomiting   Sulfa Drugs Cross Reactors Nausea And Vomiting   Latex Itching   Allergies as of 03/28/2024       Reactions   Bee Venom Anaphylaxis, Swelling   Misc. Sulfonamide Containing Compounds Nausea And Vomiting   Sulfa Drugs Cross Reactors Nausea And Vomiting   Latex Itching        Medication List     STOP taking these medications    losartan  25 MG tablet Commonly known as: COZAAR        TAKE these medications    Accu-Chek Guide Test test strip Generic drug:  glucose blood CHECK BOOD SUGAR FOUR TIMES DAILY Dx E11.9   Accu-Chek Guide w/Device Kit Test BS 4 times daily Dx E11.9   Accu-Chek Softclix Lancets lancets CHECK BOOD SUGAR FOUR TIMES DAILY Dx E11.9   acetaminophen  500 MG tablet Commonly known as: TYLENOL  Take 1,000 mg by mouth every 6 (six) hours as needed for moderate pain.   albuterol  108 (90 Base) MCG/ACT inhaler Commonly known as: VENTOLIN  HFA Inhale 2 puffs into the lungs every 4 (four) hours as needed for wheezing or shortness of breath.   amLODipine  5 MG tablet Commonly known as: NORVASC  TAKE 1 TABLET BY MOUTH DAILY   aspirin  81 MG chewable tablet Chew 81 mg by mouth daily.   bisoprolol  5 MG tablet Commonly known as: ZEBETA  TAKE 1 TABLET BY MOUTH TWICE DAILY   blood glucose meter kit and supplies Dispense based on patient and insurance preference. Use up to four times daily as directed. (FOR ICD-10:  E11.22). Check BG daily   budesonide -formoterol  160-4.5 MCG/ACT inhaler Commonly known as: Symbicort  INHALE TWO PUFFS first thing IN THE MORNING AND THEN INHALE another 2 PUFFS about 12 HOURS LATER   busPIRone  10 MG tablet Commonly known as: BUSPAR  Take 1 tablet (10 mg total) by mouth 2 (two) times daily.   calcium  carbonate 500 MG chewable tablet Commonly known as: TUMS - dosed in mg elemental calcium  Chew 1 tablet by mouth daily as needed for indigestion or heartburn.   clopidogrel  75 MG tablet Commonly known as: PLAVIX  TAKE 1 TABLET BY MOUTH EVERY DAY   cyclobenzaprine  10 MG tablet Commonly known as: FLEXERIL  Take 1 tablet (10 mg total) by mouth at bedtime as needed for muscle spasms.   DULoxetine  60 MG capsule Commonly known as: CYMBALTA  Take 1 capsule (60 mg total) by mouth at bedtime.   ezetimibe  10 MG tablet Commonly known as: ZETIA  TAKE 1 TABLET BY MOUTH EVERY DAY   fenofibrate  48 MG tablet Commonly known as: TRICOR  TAKE 1 TABLET BY MOUTH DAILY   FLUoxetine  20 MG capsule Commonly known as:  PROZAC  TAKE ONE CAPSULE BY MOUTH EVERY DAY GIVE WITH CYMBALTA  DUE TO NERVE PAIN AND ANXIETY/DEPRESSION   icosapent  Ethyl 1 g capsule Commonly known as: VASCEPA  TAKE TWO CAPSULES BY MOUTH TWICE DAILY   medroxyPROGESTERone  5 MG tablet Commonly known as: PROVERA  TAKE 1 TABLET BY MOUTH DAILY   metFORMIN  1000 MG tablet Commonly known  as: GLUCOPHAGE  Take 1 tablet (1,000 mg total) by mouth daily with breakfast.   nitroGLYCERIN  0.4 MG SL tablet Commonly known as: NITROSTAT  DISSOLVE 1 TABLET UNDER THE TONGUE EVERY 5 MINUTES AS NEEDED FOR CHEST PAIN. DO NOT EXCEED A TOTAL OF 3 DOSES IN 15 MINUTES.   Ozempic  (1 MG/DOSE) 4 MG/3ML Sopn Generic drug: Semaglutide  (1 MG/DOSE) INJECT 1 MG WEEKLY (TO REPLACE 0.5MG  OZEMPIC )   potassium chloride  SA 20 MEQ tablet Commonly known as: KLOR-CON  M TAKE 1 TABLET BY MOUTH EVERY DAY AS NEEDED (when taking torsemide )   pregabalin  150 MG capsule Commonly known as: LYRICA  TAKE ONE CAPSULE BY MOUTH THREE TIMES DAILY   ranolazine  1000 MG SR tablet Commonly known as: RANEXA  TAKE 1 TABLET BY MOUTH TWICE DAILY   Repatha  SureClick 140 MG/ML Soaj Generic drug: Evolocumab  INJECT 140 MG UNDER THE SKIN EVERY 14 DAYS   rosuvastatin  40 MG tablet Commonly known as: CRESTOR  Take 1 tablet (40 mg total) by mouth daily.   torsemide  20 MG tablet Commonly known as: DEMADEX  TAKE 2 TABLETS BY MOUTH DAILY AS NEEDED FOR swelling   traMADol  50 MG tablet Commonly known as: ULTRAM  TAKE 2 TABLETS BY MOUTH TWICE DAILY        Procedures/Studies: ECHOCARDIOGRAM COMPLETE Result Date: 03/27/2024    ECHOCARDIOGRAM REPORT   Patient Name:   Bobbiejo D Schlereth Date of Exam: 03/27/2024 Medical Rec #:  [id number]         Height:       70.0 in Accession #:    [id number]        Weight:       378.7 lb Date of Birth:  03-Sep-1981         BSA:          2.738 m Patient Age:    43 years          BP:           114/67 mmHg Patient Gender: F                 HR:           79 bpm. Exam Location:   [facility] Procedure: 2D Echo, Cardiac Doppler and Color Doppler (Both Spectral and Color            Flow Doppler were utilized during procedure). Indications:    R07.9* Chest pain, unspecified  History:        Patient has prior history of Echocardiogram examinations, most                 recent 03/27/2016.  Sonographer:    Eva Lash Referring Phys: [id number] SCOTESIA Y DUNLAP IMPRESSIONS  1. Left ventricular ejection fraction, by estimation, is 60 to 65%. The left ventricle has normal function. The left ventricle has no regional wall motion abnormalities. There is moderate concentric left ventricular hypertrophy. Left ventricular diastolic parameters were normal.  2. Right ventricular systolic function is normal. The right ventricular size is normal. Tricuspid regurgitation signal is inadequate for assessing PA pressure.  3. The mitral valve is grossly normal. Trivial mitral valve regurgitation.  4. The aortic valve is grossly normal. Aortic valve regurgitation is not visualized.  5. The inferior vena cava is normal in size with greater than 50% respiratory variability, suggesting right atrial pressure of 3 mmHg. Comparison(s): Prior images reviewed side by side. LVEF 60-65% with moderate LVH. FINDINGS  Left Ventricle: Left ventricular ejection fraction, by estimation, is 60 to 65%. The left ventricle  has normal function. The left ventricle has no regional wall motion abnormalities. Definity contrast agent was given IV to delineate the left ventricular  endocardial borders. The left ventricular internal cavity size was normal in size. There is moderate concentric left ventricular hypertrophy. Left ventricular diastolic parameters were normal. Right Ventricle: The right ventricular size is normal. No increase in right ventricular wall thickness. Right ventricular systolic function is normal. Tricuspid regurgitation signal is inadequate for assessing PA pressure. Left Atrium: Left atrial size was normal in size. Right  Atrium: Right atrial size was normal in size. Pericardium: There is no evidence of pericardial effusion. Presence of epicardial fat layer. Mitral Valve: The mitral valve is grossly normal. Trivial mitral valve regurgitation. Tricuspid Valve: The tricuspid valve is grossly normal. Tricuspid valve regurgitation is trivial. Aortic Valve: The aortic valve is grossly normal. Aortic valve regurgitation is not visualized. Pulmonic Valve: The pulmonic valve was grossly normal. Pulmonic valve regurgitation is trivial. Aorta: The aortic root and ascending aorta are structurally normal, with no evidence of dilitation. Venous: The inferior vena cava is normal in size with greater than 50% respiratory variability, suggesting right atrial pressure of 3 mmHg. IAS/Shunts: No atrial level shunt detected by color flow Doppler. Additional Comments: 3D was performed not requiring image post processing on an independent workstation and was indeterminate.  LEFT VENTRICLE PLAX 2D LVIDd:         3.90 cm      Diastology LVIDs:         2.70 cm      LV e' medial:    7.29 cm/s LV PW:         1.60 cm      LV E/e' medial:  10.9 LV IVS:        1.50 cm      LV e' lateral:   8.49 cm/s LVOT diam:     2.60 cm      LV E/e' lateral: 9.4 LVOT Area:     5.31 cm  LV Volumes (MOD) LV vol d, MOD A2C: 59.8 ml LV vol d, MOD A4C: 180.0 ml LV vol s, MOD A2C: 21.2 ml LV vol s, MOD A4C: 60.3 ml LV SV MOD A2C:     38.6 ml LV SV MOD A4C:     180.0 ml LV SV MOD BP:      85.7 ml RIGHT VENTRICLE RV S prime:     15.30 cm/s TAPSE (M-mode): 2.8 cm LEFT ATRIUM             Index LA diam:        5.40 cm 1.97 cm/m LA Vol (A2C):   94.7 ml 34.59 ml/m LA Vol (A4C):   76.4 ml 27.90 ml/m LA Biplane Vol: 86.4 ml 31.55 ml/m   AORTA Ao Asc diam: 3.60 cm MITRAL VALVE MV Area (PHT): 5.02 cm    SHUNTS MV Decel Time: 151 msec    Systemic Diam: 2.60 cm MV E velocity: 79.70 cm/s MV A velocity: 65.00 cm/s MV E/A ratio:  1.23 Jayson Sierras MD Electronically signed by Jayson Sierras  MD Signature Date/Time: 03/27/2024/2:29:54 PM    Final    DG Chest Port 1 View Result Date: 03/27/2024 CLINICAL DATA:  Chest pain with shortness of breath, right arm pain, headache and dizziness. EXAM: PORTABLE CHEST 1 VIEW COMPARISON:  May 14, 2021 FINDINGS: The heart size and mediastinal contours are within normal limits. Low lung volumes are noted with mild, stable elevation of the right hemidiaphragm. Both lungs  are clear. The visualized skeletal structures are unremarkable. IMPRESSION: No active disease. Electronically Signed   By: Suzen Dials M.D.   On: 03/27/2024 01:54     Subjective: Pt reports feeling better today, no further chest pain, she declined to pursue cath and wants to go home today.   Discharge Exam: Vitals:   03/28/24 0323 03/28/24 0847  BP: (!) 108/56 (!) 141/82  Pulse: 85   Resp:    Temp: 98.1 F (36.7 C)   SpO2: 99%    Vitals:   03/27/24 0900 03/27/24 1954 03/28/24 0323 03/28/24 0847  BP: 114/67 (!) 141/80 (!) 108/56 (!) 141/82  Pulse: 75 79 85   Resp:      Temp: 98.4 F (36.9 C) 98.3 F (36.8 C) 98.1 F (36.7 C)   TempSrc: Axillary Oral Oral   SpO2: 100% 99% 99%   Weight:      Height:        General: Pt is alert, awake, not in acute distress Cardiovascular: normal S1/S2 +, no rubs, no gallops Respiratory: CTA bilaterally, no wheezing, no rhonchi Abdominal: Soft, NT, ND, bowel sounds + Extremities: trace bilateral pretibial edema, no cyanosis   The results of significant diagnostics from this hospitalization (including imaging, microbiology, ancillary and laboratory) are listed below for reference.     Microbiology: No results found for this or any previous visit (from the past 240 hours).   Labs: BNP (last 3 results) No results for input(s): BNP in the last 8760 hours. Basic Metabolic Panel: Recent Labs  Lab 03/27/24 0121 03/27/24 0316 03/28/24 0518  NA 134*  --  137  K 3.2*  --  3.5  CL 102  --  106  CO2 20*  --  21*  GLUCOSE  119*  --  91  BUN 15  --  9  CREATININE 0.77  --  0.67  CALCIUM  9.4  --  9.0  MG  --  1.8  --   PHOS  --  4.3  --    Liver Function Tests: Recent Labs  Lab 03/28/24 0518  AST 18  ALT 15  ALKPHOS 45  BILITOT 0.8  PROT 7.1  ALBUMIN 3.3*   No results for input(s): LIPASE, AMYLASE in the last 168 hours. No results for input(s): AMMONIA in the last 168 hours. CBC: Recent Labs  Lab 03/27/24 0121 03/28/24 0518  WBC 11.6* 9.9  HGB 12.9 12.9  HCT 38.2 38.6  MCV 90.3 93.5  PLT 282 232   Cardiac Enzymes: No results for input(s): CKTOTAL, CKMB, CKMBINDEX, TROPONINI in the last 168 hours. BNP: Invalid input(s): POCBNP CBG: Recent Labs  Lab 03/27/24 1136 03/27/24 1623 03/27/24 2126 03/28/24 0717 03/28/24 1108  GLUCAP 109* 99 103* 104* 101*   D-Dimer No results for input(s): DDIMER in the last 72 hours. Hgb A1c Recent Labs    03/27/24 0121  HGBA1C 5.2   Lipid Profile No results for input(s): CHOL, HDL, LDLCALC, TRIG, CHOLHDL, LDLDIRECT in the last 72 hours. Thyroid  function studies No results for input(s): TSH, T4TOTAL, T3FREE, THYROIDAB in the last 72 hours.  Invalid input(s): FREET3 Anemia work up No results for input(s): VITAMINB12, FOLATE, FERRITIN, TIBC, IRON, RETICCTPCT in the last 72 hours. Urinalysis    Component Value Date/Time   COLORURINE YELLOW 01/27/2018 1523   APPEARANCEUR Hazy (A) 04/21/2022 1447   LABSPEC 1.015 01/27/2018 1523   PHURINE 5.0 01/27/2018 1523   GLUCOSEU Negative 04/21/2022 1447   HGBUR MODERATE (A) 01/27/2018 1523   BILIRUBINUR  Negative 04/21/2022 1447   KETONESUR NEGATIVE 01/27/2018 1523   PROTEINUR 2+ (A) 04/21/2022 1447   PROTEINUR NEGATIVE 01/27/2018 1523   NITRITE Positive (A) 04/21/2022 1447   NITRITE POSITIVE (A) 01/27/2018 1523   LEUKOCYTESUR 1+ (A) 04/21/2022 1447   Sepsis Labs Recent Labs  Lab 03/27/24 0121 03/28/24 0518  WBC 11.6* 9.9   Microbiology No  results found for this or any previous visit (from the past 240 hours).  Time coordinating discharge:  45 mins  SIGNED:  Afton Louder, MD  [facility] 03/28/2024, 11:45 AM How to contact the [facility] Attending or Consulting provider 7A - 7P or covering provider during after hours 7P -7A, for this patient?  Check the care team in [facility] and look for a) attending/consulting [facility] provider listed and b) the [facility] team listed Log into [email or web address] and use [place]'s universal password to access. If you do not have the password, please contact the hospital operator. Locate the [facility] provider you are looking for under [facility] and page to a number that you can be directly reached. If you [place] have difficulty reaching the provider, please page the [facility] (Director on Call) for the Hospitalists listed on amion for assistance.

## 2024-03-28 NOTE — Discharge Instructions (Signed)
IMPORTANT INFORMATION: PAY CLOSE ATTENTION   PHYSICIAN DISCHARGE INSTRUCTIONS  Follow with Primary care provider  Raliegh Ip, DO  and other consultants as instructed by your Hospitalist Physician  SEEK MEDICAL CARE OR RETURN TO EMERGENCY ROOM IF SYMPTOMS COME BACK, WORSEN OR NEW PROBLEM DEVELOPS   Please note: You were cared for by a hospitalist during your hospital stay. Every effort will be made to forward records to your primary care provider.  You can request that your primary care provider send for your hospital records if they have not received them.  Once you are discharged, your primary care physician will handle any further medical issues. Please note that NO REFILLS for any discharge medications will be authorized once you are discharged, as it is imperative that you return to your primary care physician (or establish a relationship with a primary care physician if you do not have one) for your post hospital discharge needs so that they can reassess your need for medications and monitor your lab values.  Please get a complete blood count and chemistry panel checked by your Primary MD at your next visit, and again as instructed by your Primary MD.  Get Medicines reviewed and adjusted: Please take all your medications with you for your next visit with your Primary MD  Laboratory/radiological data: Please request your Primary MD to go over all hospital tests and procedure/radiological results at the follow up, please ask your primary care provider to get all Hospital records sent to his/her office.  In some cases, they will be blood work, cultures and biopsy results pending at the time of your discharge. Please request that your primary care provider follow up on these results.  If you are diabetic, please bring your blood sugar readings with you to your follow up appointment with primary care.    Please call and make your follow up appointments as soon as possible.    Also  Note the following: If you experience worsening of your admission symptoms, develop shortness of breath, life threatening emergency, suicidal or homicidal thoughts you must seek medical attention immediately by calling 911 or calling your MD immediately  if symptoms less severe.  You must read complete instructions/literature along with all the possible adverse reactions/side effects for all the Medicines you take and that have been prescribed to you. Take any new Medicines after you have completely understood and accpet all the possible adverse reactions/side effects.   Do not drive when taking Pain medications or sleeping medications (Benzodiazepines)  Do not take more than prescribed Pain, Sleep and Anxiety Medications. It is not advisable to combine anxiety,sleep and pain medications without talking with your primary care practitioner  Special Instructions: If you have smoked or chewed Tobacco  in the last 2 yrs please stop smoking, stop any regular Alcohol  and or any Recreational drug use.  Wear Seat belts while driving.  Do not drive if taking any narcotic, mind altering or controlled substances or recreational drugs or alcohol.

## 2024-03-28 NOTE — Plan of Care (Signed)

## 2024-03-29 ENCOUNTER — Telehealth: Payer: Self-pay

## 2024-03-29 NOTE — Transitions of Care (Post Inpatient/ED Visit) (Signed)
 03/29/2024  Name: [name] MRN: [id number] DOB: Jul 31, 1981  Today's TOC FU Call Status: Today's TOC FU Call Status:: Successful TOC FU Call Completed TOC FU Call Complete Date: 03/29/24 Patient's Name and Date of Birth confirmed.  Transition Care Management Follow-up Telephone Call Date of Discharge: 03/28/24 [facility]charge [facility] Type of Discharge: Inpatient Admission Primary Inpatient Discharge Diagnosis:: angina How have you been since you were released from the hospital?: Better Any questions or concerns?: No  Items Reviewed: Did you receive and understand the discharge instructions provided?: Yes Medications obtained,verified, and reconciled?: Yes (Medications Reviewed) Any new allergies since your discharge?: No Dietary orders reviewed?: Yes Do you have support at home?: Yes People in Home [RPT]: spouse  Medications Reviewed Today: Medications Reviewed Today     Reviewed by Emmitt Pan, LPN (Licensed Practical Nurse) on 03/29/24 at 1143  Med List Status: <None>   Medication Order Taking? Sig Documenting Provider Last Dose Status Informant  Accu-Chek Softclix Lancets lancets [id number] Yes CHECK BOOD SUGAR FOUR TIMES DAILY Dx E11.9 Jolinda Norene HERO, DO  Active Self, Pharmacy Records  acetaminophen  (TYLENOL ) 500 MG tablet [id number] Yes Take 1,000 mg by mouth every 6 (six) hours as needed for moderate pain. [provider]  Active Self, Pharmacy Records  albuterol  (VENTOLIN  HFA) 108 [phone] Base) MCG/ACT inhaler [id number] Yes Inhale 2 puffs into the lungs every 4 (four) hours as needed for wheezing or shortness of breath. Jolinda Norene HERO, DO  Active Self, Pharmacy Records  amLODipine  (NORVASC ) 5 MG tablet [id number] Yes TAKE 1 TABLET BY MOUTH DAILY Branch, Dorn FALCON, MD  Active Self, Pharmacy Records  aspirin  81 MG chewable tablet [id number] Yes Chew 81 mg by mouth daily. [provider]  Active Self, Pharmacy Records  bisoprolol   (ZEBETA ) 5 MG tablet [id number] Yes TAKE 1 TABLET BY MOUTH TWICE DAILY Branch, Dorn FALCON, MD  Active Self, Pharmacy Records  blood glucose meter kit and supplies [id number] Yes Dispense based on patient and insurance preference. Use up to four times daily as directed. (FOR ICD-10:  E11.22). Check BG daily Jolinda Norene HERO, DO  Active Self, Pharmacy Records  Blood Glucose Monitoring Suppl (ACCU-CHEK GUIDE) w/Device KIT [id number] Yes Test BS 4 times daily Dx E11.9 Jolinda Norene HERO, DO  Active Self, Pharmacy Records  budesonide -formoterol  (SYMBICORT ) 160-4.5 MCG/ACT inhaler [id number] Yes INHALE TWO PUFFS first thing IN THE MORNING AND THEN INHALE another 2 PUFFS about 12 HOURS LATER Darlean Ozell NOVAK, MD  Active Self, Pharmacy Records  busPIRone  (BUSPAR ) 10 MG tablet [id number] Yes Take 1 tablet (10 mg total) by mouth 2 (two) times daily. Jolinda Norene HERO, DO  Active Self, Pharmacy Records  calcium  carbonate (TUMS - DOSED IN MG ELEMENTAL CALCIUM ) 500 MG chewable tablet [id number] Yes Chew 1 tablet by mouth daily as needed for indigestion or heartburn. [provider]  Active Self, Pharmacy Records  clopidogrel  (PLAVIX ) 75 MG tablet [id number] Yes TAKE 1 TABLET BY MOUTH EVERY DAY Branch, Dorn FALCON, MD  Active Self, Pharmacy Records  cyclobenzaprine  (FLEXERIL ) 10 MG tablet [id number] Yes Take 1 tablet (10 mg total) by mouth at bedtime as needed for muscle spasms. Lovorn, Megan, MD  Active Self, Pharmacy Records  DULoxetine  (CYMBALTA ) 60 MG capsule [id number] Yes Take 1 capsule (60 mg total) by mouth at bedtime. Lovorn, Megan, MD  Active Self, Pharmacy Records  Evolocumab  (REPATHA  SURECLICK) 140 MG/ML SOAJ [id number] Yes INJECT 140 MG UNDER THE SKIN EVERY 14 DAYS Jolinda Norene M, [place]  Active  Self, Pharmacy Records  ezetimibe  (ZETIA ) 10 MG tablet [id number] Yes TAKE 1 TABLET BY MOUTH EVERY DAY Branch, Dorn FALCON, MD  Active Self, Pharmacy Records  fenofibrate  (TRICOR ) 48 MG tablet [id number] Yes TAKE  1 TABLET BY MOUTH DAILY Jolinda Norene HERO, DO  Active Self, Pharmacy Records  FLUoxetine  (PROZAC ) 20 MG capsule [id number] Yes TAKE ONE CAPSULE BY MOUTH EVERY DAY GIVE WITH CYMBALTA  DUE TO NERVE PAIN AND ANXIETY/DEPRESSION Lovorn, Duwaine, MD  Active Self, Pharmacy Records  glucose blood (ACCU-CHEK GUIDE TEST) test strip [id number] Yes CHECK BOOD SUGAR FOUR TIMES DAILY Dx E11.9 Jolinda Norene HERO, DO  Active Self, Pharmacy Records  icosapent  Ethyl (VASCEPA ) 1 g capsule [id number] Yes TAKE TWO CAPSULES BY MOUTH TWICE DAILY Branch, Dorn FALCON, MD  Active Self, Pharmacy Records  medroxyPROGESTERone  (PROVERA ) 5 MG tablet [id number] Yes TAKE 1 TABLET BY MOUTH DAILY Jolinda Norene HERO, DO  Active Self, Pharmacy Records  metFORMIN  (GLUCOPHAGE ) 1000 MG tablet [id number] Yes Take 1 tablet (1,000 mg total) by mouth daily with breakfast. Jolinda Norene HERO, DO  Active Self, Pharmacy Records  nitroGLYCERIN  (NITROSTAT ) 0.4 MG SL tablet [id number] Yes DISSOLVE 1 TABLET UNDER THE TONGUE EVERY 5 MINUTES AS NEEDED FOR CHEST PAIN. DO NOT EXCEED A TOTAL OF 3 DOSES IN 15 MINUTES. Alvan Dorn FALCON, MD  Active Self, Pharmacy Records  OZEMPIC , 1 MG/DOSE, 4 MG/3ML SOPN [id number] Yes INJECT 1 MG WEEKLY (TO REPLACE 0.5MG  OZEMPIC ) Jolinda Norene HERO, DO  Active Self, Pharmacy Records  potassium chloride  SA (KLOR-CON  M) 20 MEQ tablet [id number] Yes TAKE 1 TABLET BY MOUTH EVERY DAY AS NEEDED (when taking torsemide ) Branch, Dorn FALCON, MD  Active Self, Pharmacy Records  pregabalin  (LYRICA ) 150 MG capsule [id number] Yes TAKE ONE CAPSULE BY MOUTH THREE TIMES DAILY Lovorn, Duwaine, MD  Active Self, Pharmacy Records  ranolazine  (RANEXA ) 1000 MG SR tablet [id number] Yes TAKE 1 TABLET BY MOUTH TWICE DAILY Branch, Dorn FALCON, MD  Active Self, Pharmacy Records  rosuvastatin  (CRESTOR ) 40 MG tablet [id number] Yes Take 1 tablet (40 mg total) by mouth daily. Jolinda Norene HERO, DO  Active Self, Pharmacy Records  torsemide  (DEMADEX ) 20 MG tablet  [id number] Yes TAKE 2 TABLETS BY MOUTH DAILY AS NEEDED FOR swelling Alvan Dorn FALCON, MD  Active Self, Pharmacy Records  traMADol  (ULTRAM ) 50 MG tablet [id number] Yes TAKE 2 TABLETS BY MOUTH TWICE DAILY Debby Fidela CROME, NP  Active Self, Pharmacy Records  Med List Note (Ward, Angelica, CPhT 03/27/24 1430): Pt gets meds dose packed at [facility], call [facility] for active med list, pt can verify last doses            Home Care and Equipment/Supplies: Were Home Health Services Ordered?: NA Any new equipment or medical supplies ordered?: NA  Functional Questionnaire: Do you need assistance with bathing/showering or dressing?: No Do you need assistance with meal preparation?: No Do you need assistance with eating?: No Do you have difficulty maintaining continence: No Do you need assistance with getting out of bed/getting out of a chair/moving?: No Do you have difficulty managing or taking your medications?: No  Follow up appointments reviewed: PCP Follow-up appointment confirmed?: No (declined) MD Provider Line [phone] Given: No Specialist Hospital Follow-up appointment confirmed?: Yes Date of Specialist follow-up appointment?: 04/11/24 Follow-Up Specialty Provider:: cardio Do you need transportation to your follow-up appointment?: No Do you understand care options if your condition(s) worsen?: Yes-patient verbalized understanding    SIGNATURE Julian Lemmings, LPN [facility] Nurse Health Advisor Direct Dial [phone]

## 2024-04-10 NOTE — Progress Notes (Unsigned)
 Cardiology Office Note:  .   Date:  04/11/2024  ID:  [name], DOB October 18, 1980, MRN [id number] PCP: Jolinda Norene HERO, DO  [facility] Providers Cardiologist:  Alvan Carrier, MD Cardiology APP:  Sheron Lorette GRADE, PA-C {  History of Present Illness: [name] is a 43 y.o. female  with PMHx of CAD (s/p BMS to LAD with angioplasty of RI in 2013 and patent stent by repeat caths in 02/2012, 12/2013, and 02/2016; cath 07/2018: small D1 99% too small for PCI; cath 12/2019 DES to ramus; most recent cath 12/2022 showed ostial LAD 40%, mid LAD 99%, mid to distal LAD 95%, ostial D1 99%, ramus patent stent, LCx minimal LI's, RCA proximal to distal 50%, LAD not favorable for PCI, recommended medical management), HFpEF (2024 cath 55-65%, 03/2024 ECHO EF 60-65%), HTN, HLD, DM2, morbid obesity, tobacco use who reports to [facility] office for follow up.   Recent hospitalization 7/1-10/2023 for chest pain.  Negative troponins and EKG without ischemic changes.  Echo showed EF 60 to 65%, no RWMA, moderate LVH and trivial MVR.  Patient preferred medical management and titrating anginal medications instead of cath.  Discontinued Losartan . Discharged on increased amlodipine  to 5 mg. Continue on ASA 81 mg, Crestor  40 mg, Zetia  10 mg, fenofibrate  54 mg, Vascepa  2G twice daily, Bisoprolol  5 mg BID, Ranexa  1000 mg BID.   Today, reports improvement in chest pain since hospitalization. Chronic CP [name] occurs 1-2 x per week. CP described as similar to previous episodes as pressure, 7/10. She not taken NTG since being home from hospital. Reports improvement in SOB since hospitalization and no longer occurring with minimal exertion. SOB occurs when in the heat. Denies any dizziness, lightheaded, presyncope, edema, or syncope. Reports home BP this am was 120/60. Reports compliance with medications and only take Losartan  25 mg when SBP > 120, which she did take today. Reports daily 64 oz water  on most  days, however poor hydration today. She was outside picking up trash for 2 hours prior to appointment . She is able to do house work and yard work without any concerns. Smoke < 1/2 PPD. Denies Binging ETOH/drug use   ROS: 10 point review of system has been reviewed and considered negative except ones been listed in the HPI.   Studies Reviewed: Katelyn   Cath 12/2022   Ost LAD lesion is 40% stenosed.   Previously placed proximal LAD bare-metal stent has roughly 10% restenosis.  The jailed caliber first diagonal branch has diffuse 99 % disease.   Prox LAD to Mid LAD lesion is 20% stenosed.   CULPRIT LESION SEGMENT: Distal LAD lesion is 99% stenosed followed by a long segment of 95% stenosis.  Not favorable for PCI.   Previously placed Ost Ramus to Ramus drug-eluting stent was is widely patent.   Small caliber, nondominant RCA: Prox RCA to Dist RCA lesion is 50% stenosed.   ------------------------------------------------   The left ventricular systolic function is normal.  The left ventricular ejection fraction is 55-65% by visual estimate.   LV end diastolic pressure is normal.   There is no aortic valve stenosis.   POST CATH DIAGNOSES Widely patent proximal LAD stent and ostial to optimal RI DES stent. Culprit lesion is distal LAD subtotal occlusion involving the apical portion of the wraparound LAD. Very long segment and the small caliber of also. Not favorable for PCI-recommend medical management. Otherwise normal LV function and LVEDP. No obvious wall motion normality.  RECOMMENDATIONS Continue aggressive medical of anginal symptoms.  I have added Imdur  30 mg daily to Ranexa , bisoprolol  and amlodipine . Will review with interventional colleagues, however I do not think that this would be a favorable lesion to tackle percutaneously based on the length of the lesion and the size the downstream vessel.  Previous images to indicate that is a relatively sizable vessel, but now it appears to be less  than 2 mm.  The distance is probably at least 30 if not 40 mm. Diagnostic Dominance: Left   ECHO IMPRESSIONS 03/27/2024  1. Left ventricular ejection fraction, by estimation, is 60 to 65%. The  left ventricle has normal function. The left ventricle has no regional  wall motion abnormalities. There is moderate concentric left ventricular  hypertrophy. Left ventricular  diastolic parameters were normal.   2. Right ventricular systolic function is normal. The right ventricular  size is normal. Tricuspid regurgitation signal is inadequate for assessing  PA pressure.   3. The mitral valve is grossly normal. Trivial mitral valve  regurgitation.   4. The aortic valve is grossly normal. Aortic valve regurgitation is not  visualized.   5. The inferior vena cava is normal in size with greater than 50%  respiratory variability, suggesting right atrial pressure of 3 mmHg.   Comparison(s): Prior images reviewed side by side. LVEF 60-65% with  moderate LVH.   Physical Exam:   VS:  BP 100/70 (BP Location: Right Arm, Cuff Size: Normal)   Pulse 91   Ht 5' 10.5 (1.791 m)   Wt (!) 376 lb (170.6 kg)   SpO2 97%   BMI 53.19 kg/m    Wt Readings from Last 3 Encounters:  04/11/24 (!) 376 lb (170.6 kg)  03/27/24 (!) 378 lb 12 oz (171.8 kg)  03/13/24 (!) 379 lb (171.9 kg)    GEN: Well nourished, well developed in no acute distress while sitting in chair.  NECK: No JVD; No carotid bruits CARDIAC: RRR, no murmurs, rubs, gallops RESPIRATORY:  Clear to auscultation without rales, wheezing or rhonchi  ABDOMEN: Soft, non-tender, non-distended EXTREMITIES:  No edema; No deformity   ASSESSMENT AND PLAN: .   CAD HLD, LDL goal < 55 s/p BMS to LAD with angioplasty of RI in 2013 and patent stent by repeat caths in 02/2012, 12/2013, and 02/2016;  cath 07/2018: small D1 99% too small for PCI; cath 12/2019 DES to ramus;  most recent cath 12/2022 showed ostial LAD 40%, mid LAD 99%, mid to distal LAD 95%, ostial D1  99%, ramus patent stent, LCx minimal LI's, RCA proximal to distal 50%, LAD not favorable for PCI, recommended medical management 09/2023 LDL 55 03/2024 LFT WNL  Reports improvement in chest pain since hospitalization. Chronic CP [name] occurs 1-2 x per week. CP described as similar to previous episodes as pressure, 7/10. She not taken NTG since being home from hospital. Continue on ASA 81 mg, Crestor  40 mg, Zetia  10 mg, fenofibrate  54 mg, Vascepa  2G twice daily, Bisoprolol  5 mg BID, Ranexa  1000 mg BID (max dose)  Previously noted, did not tolerate Imdur  in the past and is on highest Ranexa  dose.  Due to disease burden have elected for indefinite DAPT.  Chronic HFpEF Echo 02/2016 [place] LVEF 50%, apical akinesis  4/ 2024 cath 55-65% ECHO 03/27/2024: EF 60-65%, no RWMA, moderate LVH, trivial MVR  Reports improvement in SOB since hospitalization and no longer occurring with minimal exertion. SOB occurs when in the heat. Sleeps with 4-5 pillows.  Continue bisoprolol  as  above  HTN BP soft in OV today: 98/64 repeat BP 100/70.  Reports home BP this am was 120/60. Reports compliance with medications and only take Losartan  25 mg when SBP > 120, which she did take today. Reports daily 64 oz water  on most days, however poor hydration today. Encourage daily adequate hydration. Continue amlodipine  5 mg and bisoprolol  as above.  Discussed decreasing losartan . Patient agrees.  Continue to monitor BP at home. If SBP > 120, then can take decreased losartan  12.5 mg as needed based on BP.    Tobacco use Smoke <1/2 PPD  Discussed importance of cessation. She is working to decrease amount and switch to vaping. Reported that pulmonary would prefer her to smoke vapes over smoking cigarettes.  Advised that complete cessation of both is encouraged.   DM 2  03/2024 A1C 5.2 Managed by PCP     Dispo: Follow up with Branch as scheduled.   Signed, Lorette CINDERELLA Kapur, PA-C

## 2024-04-11 ENCOUNTER — Ambulatory Visit: Attending: Student | Admitting: Physician Assistant

## 2024-04-11 ENCOUNTER — Encounter: Payer: Self-pay | Admitting: Physician Assistant

## 2024-04-11 VITALS — BP 100/70 | HR 91 | Ht 70.5 in | Wt 376.0 lb

## 2024-04-11 DIAGNOSIS — Z7985 Long-term (current) use of injectable non-insulin antidiabetic drugs: Secondary | ICD-10-CM | POA: Insufficient documentation

## 2024-04-11 DIAGNOSIS — I10 Essential (primary) hypertension: Secondary | ICD-10-CM | POA: Diagnosis not present

## 2024-04-11 DIAGNOSIS — E119 Type 2 diabetes mellitus without complications: Secondary | ICD-10-CM | POA: Diagnosis not present

## 2024-04-11 DIAGNOSIS — Z720 Tobacco use: Secondary | ICD-10-CM | POA: Diagnosis not present

## 2024-04-11 DIAGNOSIS — I25119 Atherosclerotic heart disease of native coronary artery with unspecified angina pectoris: Secondary | ICD-10-CM | POA: Insufficient documentation

## 2024-04-11 DIAGNOSIS — I5032 Chronic diastolic (congestive) heart failure: Secondary | ICD-10-CM | POA: Insufficient documentation

## 2024-04-11 DIAGNOSIS — E782 Mixed hyperlipidemia: Secondary | ICD-10-CM | POA: Diagnosis not present

## 2024-04-11 MED ORDER — LOSARTAN POTASSIUM 25 MG PO TABS: 12.5000 mg | ORAL_TABLET | Freq: Every day | ORAL | 3 refills | Status: AC

## 2024-04-11 NOTE — Patient Instructions (Signed)
 Medication Instructions:  Your physician has recommended you make the following change in your medication:   Decrease Losartan  to 12.5 mg Daily   Increase Water  Intake   *If you need a refill on your cardiac medications before your next appointment, please call your pharmacy*  Lab Work: NONE   If you have labs (blood work) drawn today and your tests are completely normal, you will receive your results only by: MyChart Message (if you have MyChart) OR A paper copy in the mail If you have any lab test that is abnormal or we need to change your treatment, we will call you to review the results.  Testing/Procedures: NONE   Follow-Up: At [facility], you and your health needs are our priority.  As part of our continuing mission to provide you with exceptional heart care, our providers are all part of one team.  This team includes your primary Cardiologist (physician) and Advanced Practice Providers or APPs (Physician Assistants and Nurse Practitioners) who all work together to provide you with the care you need, when you need it.  Your next appointment:    September   Provider:   You may see Alvan Carrier, MD or one of the following Advanced Practice Providers on your designated Care Team:   Laymon Qua, PA-C  Scotesia Randleman, [place] Olivia Pavy, [place]     We recommend signing up for the patient portal called MyChart.  Sign up information is provided on this After Visit Summary.  MyChart is used to connect with patients for Virtual Visits (Telemedicine).  Patients are able to view lab/test results, encounter notes, upcoming appointments, etc.  Non-urgent messages can be sent to your provider as well.   To learn more about what you can do with MyChart, go to [email or web address].   Other Instructions Thank you for choosing [facility]!

## 2024-04-18 ENCOUNTER — Encounter: Payer: Self-pay | Admitting: Physical Medicine and Rehabilitation

## 2024-04-18 ENCOUNTER — Encounter: Attending: Physical Medicine and Rehabilitation | Admitting: Physical Medicine and Rehabilitation

## 2024-04-18 VITALS — BP 130/78 | HR 77 | Ht 70.5 in | Wt 378.0 lb

## 2024-04-18 DIAGNOSIS — M7918 Myalgia, other site: Secondary | ICD-10-CM | POA: Insufficient documentation

## 2024-04-18 DIAGNOSIS — G8929 Other chronic pain: Secondary | ICD-10-CM | POA: Diagnosis not present

## 2024-04-18 DIAGNOSIS — G43709 Chronic migraine without aura, not intractable, without status migrainosus: Secondary | ICD-10-CM | POA: Insufficient documentation

## 2024-04-18 DIAGNOSIS — M545 Low back pain, unspecified: Secondary | ICD-10-CM | POA: Diagnosis not present

## 2024-04-18 DIAGNOSIS — M797 Fibromyalgia: Secondary | ICD-10-CM | POA: Insufficient documentation

## 2024-04-18 MED ORDER — LIDOCAINE HCL 1 % IJ SOLN
9.0000 mL | Freq: Once | INTRAMUSCULAR | Status: AC
  Administered 2024-04-18: 9 mL

## 2024-04-18 NOTE — Patient Instructions (Addendum)
 Plan: For L shoulder pain- Cannot have NSAIDs- due to heart issues-  can use topical anti-inflammatories- Voltaren or Diclofenac  gel 4x/day-  Apply to top, front, back and side, and underneath shoulder  2.  Not due for UDS- since don't check as often when on Tramadol -   3. Last refilled Tramadol  03/12/24- not due today.    4. Last refill Lyrica  12/26/23- not due -con't reigmen  5. Last refill of Duloxetine  01/02/24- not due today- con't regimen.   6. Last Flexeril  rx- 01/02/24- not due- will refill at next appointment.   7. Patient here for trigger point injections for  Consent done and on chart.  Cleaned areas with alcohol and injected using a 27 gauge 1.5 inch needle  Injected 7.5cc- wasted 1.5 cc Using 1% Lidocaine  with no EPI  Upper traps B/L Levators- B/L  Posterior scalenes Middle scalenes- B/L  Splenius Capitus- B/L  Pectoralis Major- B/L  Rhomboids- B/L x3 Infraspinatus Teres Major/minor- B/L  Thoracic paraspinals- B/L x2 Lumbar paraspinals- B/L x3 Other injections- L deltoid x2   Patient's level of pain prior was Current level of pain after injections is is already doing better  There was no bleeding or complications.  Patient was advised to drink a lot of water  on day after injections to flush system Will have increased soreness for 12-48 hours after injections.  Can use Lidocaine  patches the day AFTER injections Can use theracane on day of injections in places didn't inject Can use heating pad 4-6 hours AFTER injections   8. F/U in 2 months- has refills- f/u on FMS/Chronic pain syndrome

## 2024-04-18 NOTE — Progress Notes (Signed)
 Pt is a 43 yr old female with BMI of 56- weight 390 lbs- down to 371 lbs today, DM, GAD, HTN,  and  CHF- chronic pain- Appears to have L T8 foraminal thoracic stenosis.  As well as fibromyalgia and myofascial pain syndrome.   Here for f/u on Chronic pain and trigger point injection               Shoulder is really bothering her- L shoulder- wondering if slept wrong- when moves it,it feels like ripping the muscle.  Brings tears to her- started 3 weeks ago- no trauma- woke up with the pain.  ?slept funky?  Spent 2 days in hospital in beginning of July - DBP in 40's- SBP  80's -low 100's-    Wants trigger point injections today.  Is hurting pretty bad,   Hair up for me, but hurts SO bad     Plan: For L shoulder pain- Cannot have NSAIDs- due to heart issues-  can use topical anti-inflammatories- Voltaren or Diclofenac  gel 4x/day-  Apply to top, front, back and side, and underneath shoulder  2.  Not due for UDS- since don't check as often when on Tramadol -   3. Last refilled Tramadol  03/12/24- not due today.    4. Last refill Lyrica  12/26/23- not due -con't reigmen  5. Last refill of Duloxetine  01/02/24- not due today- con't regimen.   6. Last Flexeril  rx- 01/02/24- not due- will refill at next appointment.   7. Patient here for trigger point injections for  Consent done and on chart.  Cleaned areas with alcohol and injected using a 27 gauge 1.5 inch needle  Injected 7.5cc- wasted 1.5 cc Using 1% Lidocaine  with no EPI  Upper traps B/L Levators- B/L  Posterior scalenes Middle scalenes- B/L  Splenius Capitus- B/L  Pectoralis Major- B/L  Rhomboids- B/L x3 Infraspinatus Teres Major/minor- B/L  Thoracic paraspinals- B/L x2 Lumbar paraspinals- B/L x3 Other injections- L deltoid x2   Patient's level of pain prior was Current level of pain after injections is is already doing better  There was no bleeding or complications.  Patient was advised to drink a lot of water   on day after injections to flush system Will have increased soreness for 12-48 hours after injections.  Can use Lidocaine  patches the day AFTER injections Can use theracane on day of injections in places didn't inject Can use heating pad 4-6 hours AFTER injections   8. F/U in 3 months- has refills- f/u on FMS/Chronic pain syndrome   I spent a total of 27   minutes on total care today- >50% coordination of care- due to  7 minutes on trP injections and f/u on discussion of L shoulder pain- and how to treat- wait on L shoulder injections since in deltoid, not actual shoulder joint.

## 2024-04-19 DIAGNOSIS — E1169 Type 2 diabetes mellitus with other specified complication: Secondary | ICD-10-CM | POA: Diagnosis not present

## 2024-04-19 DIAGNOSIS — E1159 Type 2 diabetes mellitus with other circulatory complications: Secondary | ICD-10-CM | POA: Diagnosis not present

## 2024-04-19 DIAGNOSIS — N3946 Mixed incontinence: Secondary | ICD-10-CM | POA: Diagnosis not present

## 2024-04-20 ENCOUNTER — Other Ambulatory Visit: Payer: Self-pay | Admitting: Family Medicine

## 2024-04-20 DIAGNOSIS — E1169 Type 2 diabetes mellitus with other specified complication: Secondary | ICD-10-CM

## 2024-05-01 ENCOUNTER — Encounter: Payer: Self-pay | Admitting: Family Medicine

## 2024-05-17 DIAGNOSIS — M25521 Pain in right elbow: Secondary | ICD-10-CM | POA: Diagnosis not present

## 2024-05-17 DIAGNOSIS — S5001XA Contusion of right elbow, initial encounter: Secondary | ICD-10-CM | POA: Diagnosis not present

## 2024-05-20 DIAGNOSIS — N3946 Mixed incontinence: Secondary | ICD-10-CM | POA: Diagnosis not present

## 2024-05-20 DIAGNOSIS — E1169 Type 2 diabetes mellitus with other specified complication: Secondary | ICD-10-CM | POA: Diagnosis not present

## 2024-05-20 DIAGNOSIS — E1159 Type 2 diabetes mellitus with other circulatory complications: Secondary | ICD-10-CM | POA: Diagnosis not present

## 2024-05-23 ENCOUNTER — Telehealth: Payer: Self-pay

## 2024-05-23 NOTE — Telephone Encounter (Signed)
(  Key: [id number]) PA Case ID #: [id number] Rx #: [id number] Need Help? Call us  at [phone] Status sent iconSent to Plan today Drug traMADol  HCl 50MG  tablets ePA cloud logo Form [facility] [place]  [place] Electronic PA Form [phone] NCPDP

## 2024-05-23 NOTE — Telephone Encounter (Signed)
 PA for Tramadol  50 MG approved 05/23/2024 to 11/19/2024.

## 2024-05-31 ENCOUNTER — Ambulatory Visit: Attending: Cardiology | Admitting: Cardiology

## 2024-05-31 VITALS — BP 119/72 | HR 79 | Ht 70.0 in | Wt 382.0 lb

## 2024-05-31 DIAGNOSIS — I10 Essential (primary) hypertension: Secondary | ICD-10-CM | POA: Insufficient documentation

## 2024-05-31 DIAGNOSIS — I5032 Chronic diastolic (congestive) heart failure: Secondary | ICD-10-CM | POA: Diagnosis not present

## 2024-05-31 DIAGNOSIS — E782 Mixed hyperlipidemia: Secondary | ICD-10-CM | POA: Diagnosis not present

## 2024-05-31 DIAGNOSIS — I25119 Atherosclerotic heart disease of native coronary artery with unspecified angina pectoris: Secondary | ICD-10-CM | POA: Diagnosis not present

## 2024-05-31 NOTE — Patient Instructions (Signed)
 Medication Instructions:  Your physician recommends that you continue on your current medications as directed. Please refer to the Current Medication list given to you today.   Labwork: None today  Testing/Procedures: None today  Follow-Up: 6 months  Any Other Special Instructions Will Be Listed Below (If Applicable).  If you need a refill on your cardiac medications before your next appointment, please call your pharmacy.

## 2024-05-31 NOTE — Progress Notes (Signed)
 Clinical Summary [name] is a 43 y.o.female seen today for follow up of the following medical problems.      1. CAD - prior anterior MI 09/2011, 95% lesion in mid LAD and ramus with 95% ostial stenosis. LVEF 55% by LV gram, apical hypokinesis. BMS to mid LAD and cutting balloon angioplasty of ramus.   - repeat cath 02/2012 with patent vessels and stent - 09/2011 Echo: LVEF 50% - 12/2013 Lexiscan  large anteroseptal ischemia - cath 12/2013 with patent vessels  - cath 02/2016 [facility] in setting of NSTEMI showed LM patent, LAD patent with patent stent, distal LAD 30-40%, LCX patent, RCA 30-40%. LVEDP 40, PCWP 30 mean PA 32, CI 4.93 - echo 02/2016 [place] LVEF 50%, apical akinesis       - 07/2018 cath mild to moderate disease other than small D1 99% too small for PCI. Normal LVEDP - we tried imdur  15mg  daily. Reported some episodes of shaking on this medication. Changed to ranexa      12/2019 admitted with NSTEMI - cath with LM patent, ostial LAD 40%, D1 99%, ramus ostial 90%, LCX patent, RCA prox 50%. Received DES to ramus        12/2022 cath: LM normal, ostial LAD 40%, mid LAD 99% and mid to distal 95%. Ostial D1 99%, ramus patent stents, LCX minimal LIs, RCA prox to distal 50%. LAD not favorable for PCI  03/2024 admit with chest pain - trops negative -03/2024 echo LVEF 60-65%, no WMAs, normal diastolic - mild chronic symptoms, has not needed repeat NG since ER visit in 7/29025     2. Chronic diastolic HF - takes torsemide  prn which overall controlls is.    3. HTN -compliant withmeds   4. Hyperlipidemia  - she is on crestor  40, repatha , zetia  10mg  daily - followed by lipid clinic  - Jan 2024 TC 113 TG 123 HDL 37 LDL 54 - Jan 2025 TC 118 TG 145 HDL 38 LDL 55  - complaint with meds       SH: husband with recent stent for MI [name] her father in law [name] passed July   Past Medical History:  Diagnosis Date   Allergy    Anemia    Anginal pain (HCC)    Anxiety     Arthritis    CHF (congestive heart failure) (HCC)    Chronic asthmatic bronchitis (HCC) 08/17/2021   Chronic kidney disease    history of kidney stones   Coronary artery disease 09/2011   a.s/p BMS to LAD and angioplasty of RI in 09/2011 b. patent stent by cath in 02/2012, 12/2013, and 02/2016 with most recent showing 30-40% dLAD and 30-40% RCA stenosis   Depression    Diabetes mellitus    Dyspnea    GERD (gastroesophageal reflux disease)    History of kidney stones    Hyperlipidemia    Hypertension    Midsternal chest pain 03/03/2012   Migraines    Morbid obesity (HCC)    Myocardial infarct (HCC) 09/2011   Myocardial infarction (HCC) 02/2016   Neuromuscular disorder (HCC)    Palpitations 02/17/2012   Ureteral stone with hydronephrosis    Urinary tract obstruction due to kidney stone 02/15/2017     Allergies  Allergen Reactions   Bee Venom Anaphylaxis and Swelling   Misc. Sulfonamide Containing Compounds Nausea And Vomiting   Sulfa Drugs Cross Reactors Nausea And Vomiting   Latex Itching     Current Outpatient Medications  Medication  Sig Dispense Refill   Accu-Chek Softclix Lancets lancets CHECK BOOD SUGAR FOUR TIMES DAILY Dx E11.9 400 each 3   acetaminophen  (TYLENOL ) 500 MG tablet Take 1,000 mg by mouth every 6 (six) hours as needed for moderate pain.     albuterol  (VENTOLIN  HFA) 108 (90 Base) MCG/ACT inhaler Inhale 2 puffs into the lungs every 4 (four) hours as needed for wheezing or shortness of breath. 8 g 2   amLODipine  (NORVASC ) 5 MG tablet TAKE 1 TABLET BY MOUTH DAILY 30 tablet 6   aspirin  81 MG chewable tablet Chew 81 mg by mouth daily.     bisoprolol  (ZEBETA ) 5 MG tablet TAKE 1 TABLET BY MOUTH TWICE DAILY 60 tablet 3   blood glucose meter kit and supplies Dispense based on patient and insurance preference. Use up to four times daily as directed. (FOR ICD-10:  E11.22). Check BG daily 1 each 0   Blood Glucose Monitoring Suppl (ACCU-CHEK GUIDE) w/Device KIT Test  BS 4 times daily Dx E11.9 1 kit 0   budesonide -formoterol  (SYMBICORT ) 160-4.5 MCG/ACT inhaler INHALE TWO PUFFS first thing IN THE MORNING AND THEN INHALE another 2 PUFFS about 12 HOURS LATER 10.2 g 11   busPIRone  (BUSPAR ) 10 MG tablet Take 1 tablet (10 mg total) by mouth 2 (two) times daily. 180 tablet 3   calcium  carbonate (TUMS - DOSED IN MG ELEMENTAL CALCIUM ) 500 MG chewable tablet Chew 1 tablet by mouth daily as needed for indigestion or heartburn.     clopidogrel  (PLAVIX ) 75 MG tablet TAKE 1 TABLET BY MOUTH EVERY DAY 90 tablet 2   cyclobenzaprine  (FLEXERIL ) 10 MG tablet Take 1 tablet (10 mg total) by mouth at bedtime as needed for muscle spasms. 30 tablet 5   DULoxetine  (CYMBALTA ) 60 MG capsule Take 1 capsule (60 mg total) by mouth at bedtime. 90 capsule 1   Evolocumab  (REPATHA  SURECLICK) 140 MG/ML SOAJ INJECT 140 MG UNDER THE SKIN EVERY 14 DAYS 6 mL 2   ezetimibe  (ZETIA ) 10 MG tablet TAKE 1 TABLET BY MOUTH EVERY DAY 90 tablet 2   fenofibrate  (TRICOR ) 48 MG tablet TAKE 1 TABLET BY MOUTH DAILY 30 tablet 5   FLUoxetine  (PROZAC ) 20 MG capsule TAKE ONE CAPSULE BY MOUTH EVERY DAY GIVE WITH CYMBALTA  DUE TO NERVE PAIN AND ANXIETY/DEPRESSION 90 capsule 1   glucose blood (ACCU-CHEK GUIDE TEST) test strip CHECK BOOD SUGAR FOUR TIMES DAILY Dx E11.9 400 strip 3   icosapent  Ethyl (VASCEPA ) 1 g capsule TAKE TWO CAPSULES BY MOUTH TWICE DAILY 360 capsule 2   losartan  (COZAAR ) 25 MG tablet Take 0.5 tablets (12.5 mg total) by mouth daily. 45 tablet 3   medroxyPROGESTERone  (PROVERA ) 5 MG tablet TAKE 1 TABLET BY MOUTH DAILY 90 tablet 2   metFORMIN  (GLUCOPHAGE ) 1000 MG tablet Take 1 tablet (1,000 mg total) by mouth daily with breakfast. 90 tablet 3   nitroGLYCERIN  (NITROSTAT ) 0.4 MG SL tablet DISSOLVE 1 TABLET UNDER THE TONGUE EVERY 5 MINUTES AS NEEDED FOR CHEST PAIN. DO NOT EXCEED A TOTAL OF 3 DOSES IN 15 MINUTES. 25 tablet 3   OZEMPIC , 1 MG/DOSE, 4 MG/3ML SOPN INJECT 1 MG WEEKLY (TO REPLACE 0.5MG  OZEMPIC ) 9 mL 2    potassium chloride  SA (KLOR-CON  M) 20 MEQ tablet TAKE 1 TABLET BY MOUTH EVERY DAY AS NEEDED (when taking torsemide ) 90 tablet 3   pregabalin  (LYRICA ) 150 MG capsule TAKE ONE CAPSULE BY MOUTH THREE TIMES DAILY 90 capsule 5   ranolazine  (RANEXA ) 1000 MG Stafford tablet TAKE 1 TABLET  BY MOUTH TWICE DAILY 180 tablet 2   rosuvastatin  (CRESTOR ) 40 MG tablet Take 1 tablet (40 mg total) by mouth daily. 90 tablet 3   torsemide  (DEMADEX ) 20 MG tablet TAKE 2 TABLETS BY MOUTH DAILY AS NEEDED FOR swelling 180 tablet 3   traMADol  (ULTRAM ) 50 MG tablet TAKE 2 TABLETS BY MOUTH TWICE DAILY 120 tablet 2   No current facility-administered medications for this visit.     Past Surgical History:  Procedure Laterality Date   BIOPSY  10/27/2020   Procedure: BIOPSY;  Surgeon: Shaaron Lamar HERO, MD;  Location: [facility];  Service: Endoscopy;;   CARDIAC CATHETERIZATION  10/08/2011   LAD: 95% mid, Ramus: 95% ostial   CARPAL TUNNEL RELEASE     CESAREAN SECTION     CHOLECYSTECTOMY     CORONARY ANGIOPLASTY WITH STENT PLACEMENT  10/08/2011   LAD: BMS, Ramus: cutting balloon angioplasty   CORONARY STENT INTERVENTION N/A 01/14/2020   Procedure: CORONARY STENT INTERVENTION;  Surgeon: Anner Alm ORN, MD;  Location: [facility] INVASIVE CV LAB;  Service: Cardiovascular;  Laterality: N/A;   CYSTOSCOPY WITH RETROGRADE PYELOGRAM, URETEROSCOPY AND STENT PLACEMENT Left 02/16/2017   Procedure: CYSTOSCOPY WITH RETROGRADE PYELOGRAM, URETEROSCOPY AND STENT PLACEMENT;  Surgeon: Ottelin, Mark, MD;  Location: [facility];  Service: Urology;  Laterality: Left;   CYSTOSCOPY WITH RETROGRADE PYELOGRAM, URETEROSCOPY AND STENT PLACEMENT Left 03/28/2017   Procedure: LEFT STENT REMOVAL LEFT RETROGRADE PYELOGRAM LEFT URETEROSCOPY   LASER LITHOTRIPSY  AND  STENT REPLACEMENT;  Surgeon: Sherrilee Belvie CROME, MD;  Location: [facility];  Service: Urology;  Laterality: Left;   CYSTOSCOPY WITH RETROGRADE PYELOGRAM, URETEROSCOPY AND STENT PLACEMENT Left 04/13/2022    Procedure: CYSTOSCOPY WITH RETROGRADE PYELOGRAM, URETEROSCOPY AND STENT PLACEMENT, BASKET EXTRACTION;  Surgeon: Sherrilee Belvie CROME, MD;  Location: [facility];  Service: Urology;  Laterality: Left;   ESOPHAGOGASTRODUODENOSCOPY (EGD) WITH PROPOFOL  N/A 10/27/2020   Rourk: mild erosive reflux esophagitis, h.pylori gastritis   LEFT HEART CATH AND CORONARY ANGIOGRAPHY N/A 08/15/2018   Procedure: LEFT HEART CATH AND CORONARY ANGIOGRAPHY;  Surgeon: Swaziland, Peter M, MD;  Location: [facility] INVASIVE CV LAB;  Service: Cardiovascular;  Laterality: N/A;   LEFT HEART CATH AND CORONARY ANGIOGRAPHY N/A 01/14/2020   Procedure: LEFT HEART CATH AND CORONARY ANGIOGRAPHY;  Surgeon: Anner Alm ORN, MD;  Location: [facility] INVASIVE CV LAB;  Service: Cardiovascular;  Laterality: N/A;   LEFT HEART CATH AND CORONARY ANGIOGRAPHY N/A 01/18/2023   Procedure: LEFT HEART CATH AND CORONARY ANGIOGRAPHY;  Surgeon: Anner Alm ORN, MD;  Location: [facility] INVASIVE CV LAB;  Service: Cardiovascular;  Laterality: N/A;   LEFT HEART CATHETERIZATION WITH CORONARY ANGIOGRAM N/A 10/08/2011   Procedure: LEFT HEART CATHETERIZATION WITH CORONARY ANGIOGRAM;  Surgeon: Peter M Swaziland, MD;  Location: [facility] CATH LAB;  Service: Cardiovascular;  Laterality: N/A;   LEFT HEART CATHETERIZATION WITH CORONARY ANGIOGRAM N/A 03/02/2012   Procedure: LEFT HEART CATHETERIZATION WITH CORONARY ANGIOGRAM;  Surgeon: Lonni JONETTA Cash, MD;  Location: [facility] CATH LAB;  Service: Cardiovascular;  Laterality: N/A;   LEFT HEART CATHETERIZATION WITH CORONARY ANGIOGRAM N/A 01/04/2014   Procedure: LEFT HEART CATHETERIZATION WITH CORONARY ANGIOGRAM;  Surgeon: Debby DELENA Sor, MD;  Location: [facility] CATH LAB;  Service: Cardiovascular;  Laterality: N/A;   right hand pinky surgery       Allergies  Allergen Reactions   Bee Venom Anaphylaxis and Swelling   Misc. Sulfonamide Containing Compounds Nausea And Vomiting   Sulfa Drugs Cross Reactors Nausea And Vomiting   Latex Itching  Family  History  Problem Relation Age of Onset   Heart murmur Father    Diabetes Father    Hypertension Father    Hyperlipidemia Father    Cancer Father        throat   Diabetes Mother    Cancer Mother        breast.uterine, ovarian   Asthma Son    Appendicitis Son    Hernia Son    Mental illness Son        Bipolar, personality d/o   Stroke Maternal Grandmother    Cancer Maternal Grandmother        breast   Cancer Paternal Grandfather        lung   Colon cancer Neg Hx      Social History Ms. Gallaher reports that she has been smoking cigarettes. She started smoking about 34 years ago. She has a 17.1 pack-year smoking history. She has never used smokeless tobacco. Ms. Rebello reports that she does not currently use alcohol.    Physical Examination Today's Vitals   05/31/24 1354  BP: 119/72  Pulse: 79  SpO2: 99%  Weight: (!) 382 lb (173.3 kg)  Height: 5' 10 (1.778 m)   Body mass index is 54.81 kg/m.  Gen: resting comfortably, no acute distress HEENT: no scleral icterus, pupils equal round and reactive, no palptable cervical adenopathy,  CV: RRR, no m/rg, no jvd Resp: Clear to auscultation bilaterally GI: abdomen is soft, non-tender, non-distended, normal bowel sounds, no hepatosplenomegaly MSK: extremities are warm, no edema.  Skin: warm, no rash Neuro:  no focal deficits Psych: appropriate affect   Diagnostic Studies  02/2012 Cath Hemodynamic Findings:   Central aortic pressure: 108/70   Left ventricular pressure: 123/10/21   Angiographic Findings:   Left main: No obstructive disease noted.   Left Anterior Descending Artery: Large caliber vessel that courses to the apex. There is a stent present in the mid vessel that is widely patent with no restenosis. The remainder of the LAD is disease free. There is a moderate sized diagonal Nanea Jared with 30% proximal stenosis.   Circumflex Artery: Dominant, large caliber vessel with no disease throughout the AV groove segment.  There is small to moderate sized intermediate Catlynn Grondahl that has ostial 30% stenosis. The remainder of the Circumflex has no obstructive disease.   Right Coronary Artery: Small, non-dominant vessel. No disease noted.   Left Ventricular Angiogram: LVEF 50-55%.   Impression:   1. Double vessel CAD with patent stent mid LAD and patent angioplasty site at ostium of intermediate Britanie Harshman.   2. Normal LV systolic function     09/2011 Cath PROCEDURAL FINDINGS   Hemodynamics:   AO 135/92 with a mean of 114 mmHg   LV 136/25 mmHg   Coronary angiography:   Coronary dominance: Left   Left mainstem: Normal.   Left anterior descending (LAD): There is a 95% stenosis in the mid LAD immediately after the takeoff of the first diagonal. The first diagonal Hridhaan Yohn is moderate in size and appears normal. The LAD stenosis does appear to be hazy.   There is a moderately large ramus intermediate Albaraa Swingle which has a 95% ostial stenosis.   Left circumflex (LCx): This is a dominant vessel and appears normal throughout.   Right coronary artery (RCA): This is a small nondominant vessel and is normal.   Left ventriculography: Left ventricular systolic function is abnormal with apical hypokinesis. LVEF is estimated at 55%, there is no significant mitral regurgitation   PCI  Note: Following the diagnostic procedure, the decision was made to proceed with PCI of the LAD. Weight-based bivalirudin  was given for anticoagulation. Once a therapeutic ACT was achieved, a 5 [place] EBU guide catheter was inserted. A pro-water  coronary guidewire was used to cross the lesion. The lesion was predilated with a 2.5 mm balloon. The lesion was then stented with a 3.0 x 18 mm vision stent. The stent was postdilated with a 3.25 mm noncompliant balloon. Following PCI, there was 0% residual stenosis and TIMI-3 flow. At this point the patient's pain was improved but was Sand of moderate intensity. We then proceeded to intervene on the ostial ramus  intermediate lesion. This was crossed with the prowater wire. This was dilated using a 3.0 x 10 mm cutting balloon performing 3 inflations to 6 atmospheres. This showed a good angiographic result with less than 30% residual stenosis and TIMI grade 3 flow. Final angiography confirmed an excellent result. The patient tolerated the procedure well. There were no immediate procedural complications. A TR band was used for radial hemostasis. The patient was transferred to the post catheterization recovery area for further monitoring.   PCI Data:   Vessel - LAD/Segment - mid vessel immediately after the takeoff of the first diagonal.   Percent Stenosis (pre) 95%   TIMI-flow 3.   Stent 3.0 x 18 mm vision stent.   Percent Stenosis (post) 0%   TIMI-flow (post) 3.   Second vessel: Ostial ramus intermediate Alece Koppel   Percent stenosis: 95%.   TIMI flow 3.   3.0 mm cutting balloon   Percent stenosis (post) less than 30%   TIMI flow 3   Final Conclusions:   1. Severe 2 vessel obstructive coronary disease.   2. Overall well preserved LV systolic function with apical wall motion abnormality and ejection fraction of 55%.   3. Successful intracoronary stenting of the mid LAD with a bare-metal stent.   4. Successful cutting balloon angioplasty of the ramus intermediate Jolean Madariaga.   Recommendations:   Continue therapy with Plavix . Patient reports a history of allergy to aspirin . Aggressive risk factor modification.   Disposition: Patient be observed in the intensive care unit tonight. If she has no complications she should be able to be transferred to telemetry in the morning.     12/2013 Cath HEMODYNAMICS:    Central Aorta: 100/60    Left Ventricle: 100/3   ANGIOGRAPHY:   1. Left main: Normal and trifurcated into an LAD, ramus intermediate vessel, and a dominant left circumflex coronary artery   2. LAD: Tamala gave rise to a proximal large first diagonal vessel.  The LAD just after the diagonal vessel had a  widely patent stent.  The remainder of the LAD was free of significant disease and extended to the LV apex. 3. Ramus Intermediate: No evidence for restenosis at previous site of ostial cutting balloon intervention. 4. Left circumflex: Angiographically normal.  Dominant vessel, which gave rise to 2 marginal branches and in the PDA.SABRA   4. Right coronary artery: Angiographically normal, nondominant vessel   Left ventriculography revealed global LV function.  There was a suggestion of mild residual distal anterolateral hypocontractility.  There was catheter-induced mitral regurgitation.   IMPRESSION:   Normal LV function with mild distal anterolateral, residual hypocontractility   No significant residual coronary obstructive disease with evidence for widely patent LAD stent, no evidence for restenosis of the ramus intermediate vessel, normal dominant left circumflex coronary artery and normal nondominant right coronary artery.  12/2013 MPI IMPRESSION: 1.  Abnormal Lexiscan  for ischemia   2.  Large anteroseptal and apical area of ischemia   3. High risk study for major cardiac events based on area of myocardium at [facility]   4. Normal left ventricular systolic function, left ventricular ejection fraction 52%     12/2019 cath A drug-eluting stent was successfully placed using a STENT RESOLUTE ONYX 2.5X30. Postdilated to 2.7 mm Post intervention, there is a 0% residual stenosis throughout the entire segment.SABRA ------------ Lida LAD lesion is 40% stenosed. Prox LAD previously placed BMS stent is 30% stenosed. Prox LAD to Mid LAD lesion is 20% stenosed. Ost 1st Diag lesion is 99% stenosed. Known from prior catheterizations Small, nondominant RCA: Prox RCA to Dist RCA lesion is 50% stenosed. -------------- The left ventricular systolic function is normal. The left ventricular ejection fraction is 55-65% by visual estimate. LV end diastolic pressure is normal.   CULPRIT LESION: Ostial and  proximal RAMUS INTERMEDIUS (RI) 95-70%  Successful DES PCI of RI (RESOLUTE ONYX DES 2.5 mm x 30 mm--2.7 mm) Otherwise moderate diffuse disease: Diffuse moderate LAD 20-30% stenosis and known 99% subtotal occlusion of the very small caliber 1st Diag Preserved LVEF and normal EDP.     12/2022 cath Ost LAD lesion is 40% stenosed.   Previously placed proximal LAD bare-metal stent has roughly 10% restenosis.  The jailed caliber first diagonal Awilda Covin has diffuse 99 % disease.   Prox LAD to Mid LAD lesion is 20% stenosed.   CULPRIT LESION SEGMENT: Distal LAD lesion is 99% stenosed followed by a long segment of 95% stenosis.  Not favorable for PCI.   Previously placed Ost Ramus to Ramus drug-eluting stent was is widely patent.   Small caliber, nondominant RCA: Prox RCA to Dist RCA lesion is 50% stenosed.   ------------------------------------------------   The left ventricular systolic function is normal.  The left ventricular ejection fraction is 55-65% by visual estimate.   LV end diastolic pressure is normal.   There is no aortic valve stenosis.   POST CATH DIAGNOSES Widely patent proximal LAD stent and ostial to optimal RI DES stent. Culprit lesion is distal LAD subtotal occlusion involving the apical portion of the wraparound LAD. Very long segment and the small caliber of also. Not favorable for PCI-recommend medical management. Otherwise normal LV function and LVEDP. No obvious wall motion normality.      RECOMMENDATIONS Continue aggressive medical of anginal symptoms.  I have added Imdur  30 mg daily to Ranexa , bisoprolol  and amlodipine . Will review with interventional colleagues, however I do not think that this would be a favorable lesion to tackle percutaneously based on the length of the lesion and the size the downstream vessel.  Previous images to indicate that is a relatively sizable vessel, but now it appears to be less than 2 mm.  The distance is probably at least 30 if not 40 mm.     03/2024 echo 1. Left ventricular ejection fraction, by estimation, is 60 to 65%. The  left ventricle has normal function. The left ventricle has no regional  wall motion abnormalities. There is moderate concentric left ventricular  hypertrophy. Left ventricular  diastolic parameters were normal.   2. Right ventricular systolic function is normal. The right ventricular  size is normal. Tricuspid regurgitation signal is inadequate for assessing  PA pressure.   3. The mitral valve is grossly normal. Trivial mitral valve  regurgitation.   4. The aortic valve is grossly normal. Aortic valve regurgitation is not  visualized.   5. The inferior vena cava is normal in size with greater than 50%  respiratory variability, suggesting right atrial pressure of 3 mmHg.    Assessment and Plan    1. CAD with chronic stable angina pectoris - recent cath as reported above, essentially distal small vessel LAD disease not amenable to pci -   Did not tolerate imdur  in the past, she is on highest ranexa  dose - due to disease burden have elected for indefinite DAPT - continue current meds, mild chronic stable anginal symptoms     2. Chronic diastolic HF - continue prn torsemide , overall controlling her fluid status.    3. HTN -bp is at goal, continue current meds   4. Hyperlipidemia - at goal, continue current meds.    Dorn PHEBE Ross, M.D.

## 2024-06-04 ENCOUNTER — Encounter: Payer: Self-pay | Admitting: Dermatology

## 2024-06-04 ENCOUNTER — Ambulatory Visit: Admitting: Dermatology

## 2024-06-04 VITALS — BP 128/73 | HR 82

## 2024-06-04 DIAGNOSIS — L918 Other hypertrophic disorders of the skin: Secondary | ICD-10-CM | POA: Diagnosis not present

## 2024-06-04 NOTE — Progress Notes (Signed)
   New Patient Visit   Subjective  [name] is a 43 y.o. female who presents for the following: Irritated skin tags  Patient states she has irritated skin tags located at the abdomen that she would like to have examined. Patient reports the areas have been there for 3 year. She reports the areas are not bothersome patient reports that they are tender and uncomfortable when trying to wear a bra.Patient rates irritation 6 out of 10. She states that the areas have not spread. Patient reports she has not previously been treated for these areas. Patient denies Hx of bx. Patient unaware family history of skin cancer(s).  The patient has spots, moles and lesions to be evaluated, some may be new or changing and the patient may have concern these could be cancer.   The following portions of the chart were reviewed this encounter and updated as appropriate: medications, allergies, medical history  Review of Systems:  No other skin or systemic complaints except as noted in HPI or Assessment and Plan.  Objective  Well appearing patient in no apparent distress; mood and affect are within normal limits.   A focused examination was performed of the following areas: abdomen   Relevant exam findings are noted in the Assessment and Plan.    Assessment & Plan   1. Skin Tags with  Irritation - Assessment: Patient reports two skin tags present for a couple of years. One skin tag has been growing in size over the past year and is causing discomfort when wearing a bra, impacting daily activities. Physical examination confirms the presence of two skin tags, with one notably larger than the other. Patient expresses preference for immediate removal. - Plan:    Perform excisional removal of both skin tags using scissors    Apply local anesthetic prior to procedure    Cauterize the base of the excision sites    Provide post-procedure care instructions including keeping area clean and dry    Apply  antibiotic ointment and Band-Aid to the sites    Schedule follow-up appointment for skin cancer screening and evaluation of two additional areas of concern    INFLAMED SKIN TAG (2) Left Flank, Right Flank Destruction of lesion - Left Flank, Right Flank Complexity: simple   Destruction method: electrodesiccation and curettage   Informed consent: discussed and consent obtained   Timeout:  patient name, date of birth, surgical site, and procedure verified Outcome: patient tolerated procedure well with no complications   Post-procedure details: wound care instructions given     Return in about 7 months (around 01/02/2025) for TBSE follow up.  I, Doyce Pan, CMA, am acting as scribe for [employer], DO.   Documentation: I have reviewed the above documentation for accuracy and completeness, and I agree with the above.  Delon Lenis, DO

## 2024-06-04 NOTE — Patient Instructions (Signed)

## 2024-06-11 ENCOUNTER — Other Ambulatory Visit: Payer: Self-pay | Admitting: Registered Nurse

## 2024-06-11 ENCOUNTER — Other Ambulatory Visit: Payer: Self-pay | Admitting: Physical Medicine and Rehabilitation

## 2024-06-11 ENCOUNTER — Other Ambulatory Visit: Payer: Self-pay | Admitting: Cardiology

## 2024-06-11 DIAGNOSIS — E1140 Type 2 diabetes mellitus with diabetic neuropathy, unspecified: Secondary | ICD-10-CM

## 2024-06-11 DIAGNOSIS — G8929 Other chronic pain: Secondary | ICD-10-CM

## 2024-06-13 ENCOUNTER — Encounter: Payer: Self-pay | Admitting: Physical Medicine and Rehabilitation

## 2024-06-13 ENCOUNTER — Encounter: Attending: Physical Medicine and Rehabilitation | Admitting: Physical Medicine and Rehabilitation

## 2024-06-13 VITALS — BP 112/76 | HR 77 | Ht 70.0 in | Wt 390.0 lb

## 2024-06-13 DIAGNOSIS — Z5181 Encounter for therapeutic drug level monitoring: Secondary | ICD-10-CM | POA: Insufficient documentation

## 2024-06-13 DIAGNOSIS — M7918 Myalgia, other site: Secondary | ICD-10-CM | POA: Insufficient documentation

## 2024-06-13 DIAGNOSIS — Z79891 Long term (current) use of opiate analgesic: Secondary | ICD-10-CM | POA: Insufficient documentation

## 2024-06-13 DIAGNOSIS — G894 Chronic pain syndrome: Secondary | ICD-10-CM | POA: Diagnosis not present

## 2024-06-13 DIAGNOSIS — M797 Fibromyalgia: Secondary | ICD-10-CM | POA: Diagnosis not present

## 2024-06-13 DIAGNOSIS — Z6841 Body Mass Index (BMI) 40.0 and over, adult: Secondary | ICD-10-CM | POA: Diagnosis not present

## 2024-06-13 MED ORDER — LIDOCAINE HCL 1 % IJ SOLN: 3.0000 mL | Freq: Once | INTRAMUSCULAR | Status: AC

## 2024-06-13 NOTE — Patient Instructions (Signed)
 Plan: Patient here for trigger point injections for  Consent done and on chart.  Cleaned areas with alcohol and injected using a 27 gauge 1.5 inch needle  Injected  Using 1% Lidocaine  with no EPI  Upper traps B/L  Levators B/L  Posterior scalenes Middle scalenes- B/L  Splenius Capitus- B/L  Pectoralis Major Rhomboids Infraspinatus Teres Major/minor Thoracic paraspinals Lumbar paraspinals Other injections-  Didn't do more, because she's hurting so much!  Can turn head better!  There was no bleeding or complications.  Patient was advised to drink a lot of water  on day after injections to flush system Will have increased soreness for 12-48 hours after injections.  Can use Lidocaine  patches the day AFTER injections Can use theracane on day of injections in places didn't inject Can use heating pad 4-6 hours AFTER injections  2. F/U in 3 months-   3. We discussed not overdoing before injections.

## 2024-06-13 NOTE — Progress Notes (Signed)
 Pt is a 43 yr old female with BMI of 56- weight 390 lbs- down to 371 lbs today, DM, GAD, HTN,  and  CHF- chronic pain- Appears to have L T8 foraminal thoracic stenosis.  As well as fibromyalgia and myofascial pain syndrome.   Here for f/u on Chronic pain and trigger point injections.   Beyond injections.  Hurting so bad.   Did the typical FMS stuff- overdid and today regrets it and life'.   Cannot turn head to drive, so needs injections   Decided to clean out motor home.    Plan: Patient here for trigger point injections for  Consent done and on chart.  Cleaned areas with alcohol and injected using a 27 gauge 1.5 inch needle  Injected  Using 1% Lidocaine  with no EPI  Upper traps B/L  Levators B/L  Posterior scalenes Middle scalenes- B/L  Splenius Capitus- B/L  Pectoralis Major Rhomboids Infraspinatus Teres Major/minor Thoracic paraspinals Lumbar paraspinals Other injections-  Didn't do more, because she's hurting so much!  Can turn head better!  There was no bleeding or complications.  Patient was advised to drink a lot of water  on day after injections to flush system Will have increased soreness for 12-48 hours after injections.  Can use Lidocaine  patches the day AFTER injections Can use theracane on day of injections in places didn't inject Can use heating pad 4-6 hours AFTER injections  2. F/U in 3 months-   3. We discussed not overdoing before injections.

## 2024-06-18 LAB — DRUG TOX MONITOR 1 W/CONF, ORAL FLD
Amphetamines: NEGATIVE ng/mL (ref <10)
Barbiturates: NEGATIVE ng/mL (ref <10)
Benzodiazepines: NEGATIVE ng/mL (ref <0.50)
Buprenorphine: NEGATIVE ng/mL (ref <0.10)
Cocaine: NEGATIVE ng/mL (ref <5.0)
Cotinine: 105.2 ng/mL — ABNORMAL HIGH (ref <5.0)
Fentanyl: NEGATIVE ng/mL (ref <0.10)
Heroin Metabolite: NEGATIVE ng/mL (ref <1.0)
MARIJUANA: NEGATIVE ng/mL (ref <2.5)
MDMA: NEGATIVE ng/mL (ref <10)
Meprobamate: NEGATIVE ng/mL (ref <2.5)
Methadone: NEGATIVE ng/mL (ref <5.0)
Nicotine Metabolite: POSITIVE ng/mL — AB (ref <5.0)
Opiates: NEGATIVE ng/mL (ref <2.5)
Phencyclidine: NEGATIVE ng/mL (ref <10)
Tapentadol: NEGATIVE ng/mL (ref <5.0)
Tramadol: >500 ng/mL — ABNORMAL HIGH (ref <5.0)
Tramadol: POSITIVE ng/mL — AB (ref <5.0)
Zolpidem: NEGATIVE ng/mL (ref <5.0)

## 2024-06-18 LAB — DRUG TOX ALC METAB W/CON, ORAL FLD: Alcohol Metabolite: NEGATIVE ng/mL (ref <25)

## 2024-06-20 DIAGNOSIS — N3946 Mixed incontinence: Secondary | ICD-10-CM | POA: Diagnosis not present

## 2024-06-20 DIAGNOSIS — E1159 Type 2 diabetes mellitus with other circulatory complications: Secondary | ICD-10-CM | POA: Diagnosis not present

## 2024-06-20 DIAGNOSIS — E1169 Type 2 diabetes mellitus with other specified complication: Secondary | ICD-10-CM | POA: Diagnosis not present

## 2024-06-28 ENCOUNTER — Other Ambulatory Visit

## 2024-07-09 ENCOUNTER — Other Ambulatory Visit: Payer: Self-pay | Admitting: Physical Medicine and Rehabilitation

## 2024-07-09 ENCOUNTER — Other Ambulatory Visit: Payer: Self-pay | Admitting: Cardiology

## 2024-07-09 ENCOUNTER — Other Ambulatory Visit: Payer: Self-pay | Admitting: Family Medicine

## 2024-07-09 DIAGNOSIS — E1169 Type 2 diabetes mellitus with other specified complication: Secondary | ICD-10-CM

## 2024-07-09 DIAGNOSIS — E7800 Pure hypercholesterolemia, unspecified: Secondary | ICD-10-CM

## 2024-07-09 DIAGNOSIS — I214 Non-ST elevation (NSTEMI) myocardial infarction: Secondary | ICD-10-CM

## 2024-07-12 LAB — OPHTHALMOLOGY REPORT-SCANNED

## 2024-07-13 ENCOUNTER — Encounter: Payer: Self-pay | Admitting: Family Medicine

## 2024-07-23 ENCOUNTER — Emergency Department (HOSPITAL_COMMUNITY)

## 2024-07-23 ENCOUNTER — Emergency Department (HOSPITAL_COMMUNITY)
Admission: EM | Admit: 2024-07-23 | Discharge: 2024-07-23 | Disposition: A | Discharge status: Home / Self Care | Attending: Emergency Medicine | Admitting: Emergency Medicine

## 2024-07-23 ENCOUNTER — Encounter (HOSPITAL_COMMUNITY): Payer: Self-pay

## 2024-07-23 ENCOUNTER — Other Ambulatory Visit: Payer: Self-pay

## 2024-07-23 DIAGNOSIS — E1169 Type 2 diabetes mellitus with other specified complication: Secondary | ICD-10-CM | POA: Diagnosis not present

## 2024-07-23 DIAGNOSIS — N12 Tubulo-interstitial nephritis, not specified as acute or chronic: Secondary | ICD-10-CM | POA: Diagnosis not present

## 2024-07-23 DIAGNOSIS — Z7951 Long term (current) use of inhaled steroids: Secondary | ICD-10-CM | POA: Diagnosis not present

## 2024-07-23 DIAGNOSIS — I130 Hypertensive heart and chronic kidney disease with heart failure and stage 1 through stage 4 chronic kidney disease, or unspecified chronic kidney disease: Secondary | ICD-10-CM | POA: Diagnosis not present

## 2024-07-23 DIAGNOSIS — I2510 Atherosclerotic heart disease of native coronary artery without angina pectoris: Secondary | ICD-10-CM | POA: Insufficient documentation

## 2024-07-23 DIAGNOSIS — R109 Unspecified abdominal pain: Secondary | ICD-10-CM | POA: Diagnosis not present

## 2024-07-23 DIAGNOSIS — N189 Chronic kidney disease, unspecified: Secondary | ICD-10-CM | POA: Diagnosis not present

## 2024-07-23 DIAGNOSIS — N3946 Mixed incontinence: Secondary | ICD-10-CM | POA: Diagnosis not present

## 2024-07-23 DIAGNOSIS — E1122 Type 2 diabetes mellitus with diabetic chronic kidney disease: Secondary | ICD-10-CM | POA: Diagnosis not present

## 2024-07-23 DIAGNOSIS — Z7982 Long term (current) use of aspirin: Secondary | ICD-10-CM | POA: Insufficient documentation

## 2024-07-23 DIAGNOSIS — N200 Calculus of kidney: Secondary | ICD-10-CM | POA: Diagnosis not present

## 2024-07-23 DIAGNOSIS — E1159 Type 2 diabetes mellitus with other circulatory complications: Secondary | ICD-10-CM | POA: Diagnosis not present

## 2024-07-23 DIAGNOSIS — Z7902 Long term (current) use of antithrombotics/antiplatelets: Secondary | ICD-10-CM | POA: Insufficient documentation

## 2024-07-23 DIAGNOSIS — Z7984 Long term (current) use of oral hypoglycemic drugs: Secondary | ICD-10-CM | POA: Diagnosis not present

## 2024-07-23 DIAGNOSIS — I509 Heart failure, unspecified: Secondary | ICD-10-CM | POA: Insufficient documentation

## 2024-07-23 DIAGNOSIS — Z79899 Other long term (current) drug therapy: Secondary | ICD-10-CM | POA: Diagnosis not present

## 2024-07-23 DIAGNOSIS — Z91040 Latex allergy status: Secondary | ICD-10-CM | POA: Diagnosis not present

## 2024-07-23 LAB — COMPREHENSIVE METABOLIC PANEL WITH GFR
ALT: 15 U/L (ref 0–44)
AST: 17 U/L (ref 15–41)
Albumin: 3.7 g/dL (ref 3.5–5.0)
Alkaline Phosphatase: 50 U/L (ref 38–126)
Anion gap: 13 (ref 5–15)
BUN: 14 mg/dL (ref 6–20)
CO2: 18 mmol/L — ABNORMAL LOW (ref 22–32)
Calcium: 9.4 mg/dL (ref 8.9–10.3)
Chloride: 103 mmol/L (ref 98–111)
Creatinine, Ser: 0.75 mg/dL (ref 0.44–1.00)
GFR, Estimated: >60 mL/min (ref >60)
Glucose, Bld: 137 mg/dL — ABNORMAL HIGH (ref 70–99)
Potassium: 3.7 mmol/L (ref 3.5–5.1)
Sodium: 134 mmol/L — ABNORMAL LOW (ref 135–145)
Total Bilirubin: 0.6 mg/dL (ref 0.0–1.2)
Total Protein: 7.3 g/dL (ref 6.5–8.1)

## 2024-07-23 LAB — CBC WITH DIFFERENTIAL/PLATELET
Abs Immature Granulocytes: 0.14 K/uL — ABNORMAL HIGH (ref 0.00–0.07)
Basophils Absolute: 0 K/uL (ref 0.0–0.1)
Basophils Relative: 0 %
Eosinophils Absolute: 0.1 K/uL (ref 0.0–0.5)
Eosinophils Relative: 1 %
HCT: 36.3 % (ref 36.0–46.0)
Hemoglobin: 12.2 g/dL (ref 12.0–15.0)
Immature Granulocytes: 1 %
Lymphocytes Relative: 9 %
Lymphs Abs: 1.6 K/uL (ref 0.7–4.0)
MCH: 31.3 pg (ref 26.0–34.0)
MCHC: 33.6 g/dL (ref 30.0–36.0)
MCV: 93.1 fL (ref 80.0–100.0)
Monocytes Absolute: 0.8 K/uL (ref 0.1–1.0)
Monocytes Relative: 5 %
Neutro Abs: 15 K/uL — ABNORMAL HIGH (ref 1.7–7.7)
Neutrophils Relative %: 84 %
Platelets: 253 K/uL (ref 150–400)
RBC: 3.9 MIL/uL (ref 3.87–5.11)
RDW: 13 % (ref 11.5–15.5)
WBC: 17.7 K/uL — ABNORMAL HIGH (ref 4.0–10.5)
nRBC: 0 % (ref 0.0–0.2)

## 2024-07-23 LAB — URINALYSIS, ROUTINE W REFLEX MICROSCOPIC
Bilirubin Urine: NEGATIVE
Glucose, UA: NEGATIVE mg/dL
Ketones, ur: NEGATIVE mg/dL
Nitrite: POSITIVE — AB
Protein, ur: 100 mg/dL — AB
Specific Gravity, Urine: 1.021 (ref 1.005–1.030)
WBC, UA: >50 WBC/hpf (ref 0–5)
pH: 5 (ref 5.0–8.0)

## 2024-07-23 LAB — LIPASE, BLOOD: Lipase: 22 U/L (ref 11–51)

## 2024-07-23 LAB — POC URINE PREG, ED: Preg Test, Ur: NEGATIVE

## 2024-07-23 MED ORDER — ONDANSETRON HCL 4 MG/2ML IJ SOLN
4.0000 mg | Freq: Once | INTRAMUSCULAR | Status: AC
  Administered 2024-07-23: 4 mg via INTRAVENOUS
  Filled 2024-07-23: qty 2

## 2024-07-23 MED ORDER — FENTANYL CITRATE (PF) 100 MCG/2ML IJ SOLN
50.0000 ug | Freq: Once | INTRAMUSCULAR | Status: AC
  Administered 2024-07-23: 50 ug via INTRAVENOUS
  Filled 2024-07-23: qty 2

## 2024-07-23 MED ORDER — CEPHALEXIN 500 MG PO CAPS: 500.0000 mg | ORAL_CAPSULE | Freq: Four times a day (QID) | ORAL | 0 refills | Status: AC

## 2024-07-23 MED ORDER — SODIUM CHLORIDE 0.9 % IV BOLUS
1000.0000 mL | Freq: Once | INTRAVENOUS | Status: AC
  Administered 2024-07-23: 1000 mL via INTRAVENOUS

## 2024-07-23 MED ORDER — SODIUM CHLORIDE 0.9 % IV SOLN
1.0000 g | Freq: Once | INTRAVENOUS | Status: AC
  Administered 2024-07-23: 1 g via INTRAVENOUS
  Filled 2024-07-23: qty 10

## 2024-07-23 NOTE — ED Provider Notes (Signed)
 [place] EMERGENCY DEPARTMENT AT [facility] Provider Note   CSN: [id number] Arrival date & time: 07/23/24  1432     Patient presents with: Abdominal Pain   [name] is a 43 y.o. female.   43 year old female presents today for concern of abdominal pain that radiates to her flank.  Not a boring pain into her back.  She does have history of kidney stones.  States this feels like her very first kidney stone she had.  She states she gets bilateral flank pain with her kidney stones.  She states she has not been able to tolerate p.o. intake since Saturday morning.  She states this started after she had an urge to urinate and attempted to hold it in until she mated to the bathroom.  She states when she did this she felt something pop and the pain has been persisting since.  She states the urge to go to the bathroom as normal for her.  She always attempts to hold it in until she makes it to the bathroom as well.  Denies hematuria, dysuria.  The history is provided by the patient. No language interpreter was used.       Prior to Admission medications   Medication Sig Start Date End Date Taking? Authorizing Provider  Accu-Chek Softclix Lancets lancets CHECK BOOD SUGAR FOUR TIMES DAILY Dx E11.9 08/29/23   Jolinda Potter M, DO  acetaminophen  (TYLENOL ) 500 MG tablet Take 1,000 mg by mouth every 6 (six) hours as needed for moderate pain.    [provider]  albuterol  (VENTOLIN  HFA) 108 (90 Base) MCG/ACT inhaler Inhale 2 puffs into the lungs every 4 (four) hours as needed for wheezing or shortness of breath. 03/13/24   Jolinda Potter M, DO  amLODipine  (NORVASC ) 5 MG tablet TAKE 1 TABLET BY MOUTH DAILY 07/10/24   Alvan Dorn FALCON, MD  aspirin  81 MG chewable tablet Chew 81 mg by mouth daily.    [provider]  bisoprolol  (ZEBETA ) 5 MG tablet TAKE 1 TABLET BY MOUTH TWICE DAILY 06/12/24   Alvan Dorn FALCON, MD  blood glucose meter kit and supplies Dispense  based on patient and insurance preference. Use up to four times daily as directed. (FOR ICD-10:  E11.22). Check BG daily 06/26/18   Jolinda Potter M, DO  Blood Glucose Monitoring Suppl (ACCU-CHEK GUIDE) w/Device KIT Test BS 4 times daily Dx E11.9 05/14/22   Jolinda Potter M, DO  budesonide -formoterol  (SYMBICORT ) 160-4.5 MCG/ACT inhaler INHALE TWO PUFFS first thing IN THE MORNING AND THEN INHALE another 2 PUFFS about 12 HOURS LATER 01/16/24   Darlean Ozell NOVAK, MD  busPIRone  (BUSPAR ) 10 MG tablet Take 1 tablet (10 mg total) by mouth 2 (two) times daily. 03/13/24   Jolinda Potter HERO, DO  calcium  carbonate (TUMS - DOSED IN MG ELEMENTAL CALCIUM ) 500 MG chewable tablet Chew 1 tablet by mouth daily as needed for indigestion or heartburn.    [provider]  clopidogrel  (PLAVIX ) 75 MG tablet TAKE 1 TABLET BY MOUTH EVERY DAY 01/16/24   Alvan Dorn FALCON, MD  cyclobenzaprine  (FLEXERIL ) 10 MG tablet Take 1 tablet (10 mg total) by mouth at bedtime as needed for muscle spasms. 01/02/24   Lovorn, Megan, MD  DULoxetine  (CYMBALTA ) 60 MG capsule TAKE ONE CAPSULE BY MOUTH AT BEDTIME 07/11/24   Lovorn, Megan, MD  ezetimibe  (ZETIA ) 10 MG tablet TAKE 1 TABLET BY MOUTH EVERY DAY 11/14/23   Alvan Dorn FALCON, MD  fenofibrate  (TRICOR ) 48 MG tablet TAKE  1 TABLET BY MOUTH DAILY 04/20/24   Jolinda Potter M, DO  FLUoxetine  (PROZAC ) 20 MG capsule TAKE ONE CAPSULE BY MOUTH EVERY DAY GIVE WITH CYMBALTA  DUE TO NERVE PAIN AND ANXIETY/DEPRESSION 06/12/24   Lovorn, Megan, MD  glucose blood (ACCU-CHEK GUIDE TEST) test strip CHECK BOOD SUGAR FOUR TIMES DAILY Dx E11.9 08/29/23   Jolinda Potter M, DO  icosapent  Ethyl (VASCEPA ) 1 g capsule TAKE TWO CAPSULES BY MOUTH TWICE DAILY 01/16/24   Alvan Dorn FALCON, MD  losartan  (COZAAR ) 25 MG tablet Take 0.5 tablets (12.5 mg total) by mouth daily. 04/11/24 07/10/24  Sheron Lorette GRADE, PA-C  medroxyPROGESTERone  (PROVERA ) 5 MG tablet TAKE 1 TABLET BY MOUTH DAILY 12/12/23   Jolinda Potter M, DO  metFORMIN  (GLUCOPHAGE ) 1000 MG tablet Take 1 tablet (1,000 mg total) by mouth daily with breakfast. 10/26/23   Jolinda Potter M, DO  nitroGLYCERIN  (NITROSTAT ) 0.4 MG SL tablet DISSOLVE 1 TABLET UNDER THE TONGUE EVERY 5 MINUTES AS NEEDED FOR CHEST PAIN. DO NOT EXCEED A TOTAL OF 3 DOSES IN 15 MINUTES. 01/19/22   Branch, Dorn FALCON, MD  OZEMPIC , 1 MG/DOSE, 4 MG/3ML SOPN INJECT 1 MG WEEKLY (TO REPLACE 0.5MG  OZEMPIC ) 07/10/24   Jolinda Potter M, DO  potassium chloride  SA (KLOR-CON  M) 20 MEQ tablet TAKE 1 TABLET BY MOUTH EVERY DAY AS NEEDED (when taking torsemide ) 10/21/23   Alvan Dorn FALCON, MD  pregabalin  (LYRICA ) 150 MG capsule TAKE ONE CAPSULE BY MOUTH THREE TIMES DAILY 06/12/24   Lovorn, Megan, MD  ranolazine  (RANEXA ) 1000 MG SR tablet TAKE 1 TABLET BY MOUTH TWICE DAILY 01/16/24   Alvan Dorn FALCON, MD  REPATHA  SURECLICK 140 MG/ML SOAJ INJECT 140 MG UNDER THE SKIN EVERY 14 DAYS 07/10/24   Jolinda Potter M, DO  rosuvastatin  (CRESTOR ) 40 MG tablet Take 1 tablet (40 mg total) by mouth daily. 03/13/24   Jolinda Potter HERO, DO  torsemide  (DEMADEX ) 20 MG tablet TAKE 2 TABLETS BY MOUTH DAILY AS NEEDED FOR swelling 02/13/24   Branch, Dorn FALCON, MD  traMADol  (ULTRAM ) 50 MG tablet TAKE 2 TABLETS BY MOUTH TWICE DAILY 06/12/24   Lovorn, Megan, MD    Allergies: Bee venom, Misc. sulfonamide containing compounds, Sulfa drugs cross reactors, and Latex    Review of Systems  Constitutional:  Negative for chills and fever.  Gastrointestinal:  Positive for abdominal pain, nausea and vomiting.  Genitourinary:  Positive for flank pain. Negative for dysuria and hematuria.  Neurological:  Negative for light-headedness.  All other systems reviewed and are negative.   Updated Vital Signs BP 115/74 (BP Location: Right Arm)   Pulse 100   Temp 98 F (36.7 C) (Temporal)   Resp 18   Ht 5' 10 (1.778 m)   Wt (!) 176.9 kg   LMP 07/09/2024 (Exact Date)   SpO2 99%   BMI 55.96 kg/m   Physical  Exam Vitals and nursing note reviewed.  Constitutional:      General: She is not in acute distress.    Appearance: Normal appearance. She is not ill-appearing.  HENT:     Head: Normocephalic and atraumatic.     Nose: Nose normal.  Eyes:     Conjunctiva/sclera: Conjunctivae normal.  Cardiovascular:     Rate and Rhythm: Normal rate and regular rhythm.  Pulmonary:     Effort: Pulmonary effort is normal. No respiratory distress.  Abdominal:     General: There is no distension.     Palpations: Abdomen is soft.     Tenderness: There  is abdominal tenderness. There is right CVA tenderness and left CVA tenderness. There is no guarding.  Musculoskeletal:        General: No deformity.  Skin:    Findings: No rash.  Neurological:     Mental Status: She is alert.     (all labs ordered are listed, but only abnormal results are displayed) Labs Reviewed  URINALYSIS, ROUTINE W REFLEX MICROSCOPIC  CBC WITH DIFFERENTIAL/PLATELET  COMPREHENSIVE METABOLIC PANEL WITH GFR  LIPASE, BLOOD  POC URINE PREG, ED    EKG: None  Radiology: No results found.   Procedures   Medications Ordered in the ED - No data to display                                  Medical Decision Making Amount and/or Complexity of Data Reviewed Labs: ordered. Radiology: ordered.  Risk Prescription drug management.   Medical Decision Making / ED Course   This patient presents to the ED for concern of abdominal pain, this involves an extensive number of treatment options, and is a complaint that carries with it a high risk of complications and morbidity.  The differential diagnosis includes pyelonephritis, UTI, nephrolithiasis, colitis  MDM: 43 year old female presents today for concern of abdominal pain that radiates into her flank.  Will obtain labs, CT imaging, UA.  Labs are reassuring. CT imaging shows some chronic changes around her left kidney unsure if this is acute.  Pending UA.  UA positive for  urinary tract infection. Likely acute Pilo. Will send urine culture. Will start on Keflex.  Dose of Rocephin  given in the emergency department  Discharged in stable condition.  Return precautions discussed.  Lab Tests: -I ordered, reviewed, and interpreted labs.   The pertinent results include:   Labs Reviewed  URINALYSIS, ROUTINE W REFLEX MICROSCOPIC  CBC WITH DIFFERENTIAL/PLATELET  COMPREHENSIVE METABOLIC PANEL WITH GFR  LIPASE, BLOOD  POC URINE PREG, ED      EKG  EKG Interpretation Date/Time:    Ventricular Rate:    PR Interval:    QRS Duration:    QT Interval:    QTC Calculation:   R Axis:      Text Interpretation:           Imaging Studies ordered: I ordered imaging studies including CT renal stone study I independently visualized and interpreted imaging. I agree with the radiologist interpretation   Medicines ordered and prescription drug management: No orders of the defined types were placed in this encounter.   -I have reviewed the patients home medicines and have made adjustments as needed   Reevaluation: After the interventions noted above, I reevaluated the patient and found that they have :improved  Co morbidities that complicate the patient evaluation  Past Medical History:  Diagnosis Date   Allergy    Anemia    Anginal pain    Anxiety    Arthritis    CHF (congestive heart failure) (HCC)    Chronic asthmatic bronchitis (HCC) 08/17/2021   Chronic kidney disease    history of kidney stones   Coronary artery disease 09/2011   a.s/p BMS to LAD and angioplasty of RI in 09/2011 b. patent stent by cath in 02/2012, 12/2013, and 02/2016 with most recent showing 30-40% dLAD and 30-40% RCA stenosis   Depression    Diabetes mellitus    Dyspnea    GERD (gastroesophageal reflux disease)    History of kidney  stones    Hyperlipidemia    Hypertension    Midsternal chest pain 03/03/2012   Migraines    Morbid obesity (HCC)    Myocardial infarct  (HCC) 09/2011   Myocardial infarction (HCC) 02/2016   Neuromuscular disorder (HCC)    Palpitations 02/17/2012   Ureteral stone with hydronephrosis    Urinary tract obstruction due to kidney stone 02/15/2017      Dispostion: Discharged in stable condition.  Return precaution discussed.  Patient voices understanding and is in agreement with plan.  Final diagnoses:  Pyelonephritis    ED Discharge Orders          Ordered    cephALEXin (KEFLEX) 500 MG capsule  4 times daily        07/23/24 2128               Hildegard Loge, PA-C 07/23/24 2128    Dean Clarity, MD 07/23/24 2142

## 2024-07-23 NOTE — ED Notes (Signed)
 ED Provider at bedside.

## 2024-07-23 NOTE — ED Triage Notes (Signed)
 Pt arrived via POV c/o umbilical abdominal pain that radiates to both sides of her back. Pt reports Hx of kidney stones. Pt denies hematuria.

## 2024-07-23 NOTE — Discharge Instructions (Addendum)
 You have a urinary tract infection which may have traveled up to your kidneys causing kidney infection.  You were started on antibiotics.  Please follow-up with your primary care doctor.  Return for any emergent symptoms.  Remainder of your workup was reassuring.

## 2024-07-25 ENCOUNTER — Encounter: Payer: Self-pay | Admitting: Family Medicine

## 2024-07-25 NOTE — Telephone Encounter (Signed)
 Appointment scheduled per office policy.

## 2024-07-26 ENCOUNTER — Telehealth: Admitting: Family Medicine

## 2024-07-26 ENCOUNTER — Other Ambulatory Visit: Payer: Self-pay | Admitting: Family Medicine

## 2024-07-26 DIAGNOSIS — B3731 Acute candidiasis of vulva and vagina: Secondary | ICD-10-CM | POA: Diagnosis not present

## 2024-07-26 MED ORDER — FLUCONAZOLE 150 MG PO TABS: 150.0000 mg | ORAL_TABLET | Freq: Every day | ORAL | 0 refills | Status: DC

## 2024-07-26 NOTE — Progress Notes (Signed)
 Subjective:    Patient ID: [name], female    DOB: [birth year], 43 y.o.   MRN: [id number]   HPI: [name] is a 43 y.o. female presenting for Discussed the use of AI scribe software for clinical note transcription with the patient, who gave verbal consent to proceed.  History of Present Illness [name] is a 43 year old female who presents with a yeast infection following treatment for a severe kidney infection and UTI.  She was seen in the emergency room on Monday, July 23, 2024, with severe abdominal pain and was diagnosed with a severe kidney infection and a urinary tract infection (UTI). She was prescribed Keflex, a broad-spectrum antibiotic. Following the initiation of antibiotic treatment, she developed a yeast infection, characterized by burning and peeling skin in the vaginal area, but no noticeable discharge. She has a history of similar reactions to antibiotics.  Her abdominal pain has significantly decreased, allowing her to stand without severe discomfort.  She has a history of fibromyalgia, which has been exacerbated by recent weather changes, causing her usual symptoms to flare up.        06/13/2024   12:07 PM 03/13/2024    9:52 AM 01/02/2024    9:46 AM 10/26/2023   11:33 AM 07/25/2023    8:06 AM  Depression screen PHQ 2/9  Decreased Interest 1 1 1 2 2   Down, Depressed, Hopeless 1 1 1  0 1  PHQ - 2 Score 2 2 2 2 3   Altered sleeping  1  3 3   Tired, decreased energy  0  1 1  Change in appetite  0  0 0  Feeling bad or failure about yourself   1  2 1   Trouble concentrating  0  0 0  Moving slowly or fidgety/restless  0  0 0  Suicidal thoughts  0  0 0  PHQ-9 Score  4  8 8   Difficult doing work/chores  Somewhat difficult  Somewhat difficult Somewhat difficult     Relevant past medical, surgical, family and social history reviewed and updated as indicated.  Interim medical history since our last visit reviewed. Allergies and medications  reviewed and updated.  ROS:  Review of Systems  Constitutional: Negative.   HENT: Negative.    Eyes:  Negative for visual disturbance.  Respiratory:  Negative for shortness of breath.   Cardiovascular:  Negative for chest pain.  Gastrointestinal:  Positive for abdominal pain.  Genitourinary:  Positive for vaginal pain.  Musculoskeletal:  Negative for arthralgias.     Social History   Tobacco Use  Smoking Status Some Days   Current packs/day: 0.50   Average packs/day: 0.5 packs/day for 34.4 years (17.2 ttl pk-yrs)   Types: Cigarettes   Start date: 03/18/1990  Smokeless Tobacco Never  Tobacco Comments   Vapes currently        Objective:     Wt Readings from Last 3 Encounters:  07/23/24 (!) 389 lb 15.9 oz (176.9 kg)  06/13/24 (!) 390 lb (176.9 kg)  05/31/24 (!) 382 lb (173.3 kg)     Exam deferred. Video visit performed.   Assessment & Plan:  Yeast vaginitis  Other orders -     Fluconazole ; Take 1 tablet (150 mg total) by mouth daily for 5 doses.  Dispense: 5 tablet; Refill: 0      Diagnoses and all orders for this visit:  Yeast vaginitis  Other orders -     fluconazole  (DIFLUCAN ) 150 MG  tablet; Take 1 tablet (150 mg total) by mouth daily for 5 doses.    Virtual Visit  Note  I discussed the limitations, risks, security and privacy concerns of performing an evaluation and management service by video and the availability of in person appointments. The patient was identified with two identifiers. Pt.expressed understanding and agreed to proceed. Pt. Is at home. Dr. Zollie is in his office.  Follow Up Instructions:   I discussed the assessment and treatment plan with the patient. The patient was provided an opportunity to ask questions and all were answered. The patient agreed with the plan and demonstrated an understanding of the instructions.   The patient was advised to call back or seek an in-person evaluation if the symptoms worsen or if the condition fails  to improve as anticipated.     Follow up plan: No follow-ups on file.  Butler Zollie, MD [facility]

## 2024-07-27 ENCOUNTER — Telehealth: Payer: Self-pay

## 2024-07-27 NOTE — Telephone Encounter (Signed)
 Recommend using over-the-counter vaginal antifungal like Monistat

## 2024-07-27 NOTE — Telephone Encounter (Signed)
 I called and spoke with patient and made her aware of Dr Melba advise. Patient voiced understanding. She says she will try the Monistat cream but will let us  know if that doesn't help. Says she has taken Diflucan  before with her heart medication and didn't have any issues.

## 2024-07-27 NOTE — Telephone Encounter (Signed)
 Copied from CRM [id number]. Topic: Clinical - Prescription Issue >> Jul 26, 2024  4:22 PM Susanna ORN wrote: Reason for CRM: [name], with [facility], called stating that they received a prescription today from Dr. Zollie for this patient. She states fluconazole  (DIFLUCAN ) 150 MG tablet contraindicates with patient's heart medication, ranolazine  (RANEXA ) 1000 MG SR tablet. She's needing to know what he would like to do as far as a judgement call & they are needing clarification. Please give her a call back. CB #: [phone].

## 2024-07-29 ENCOUNTER — Encounter: Payer: Self-pay | Admitting: Family Medicine

## 2024-07-31 ENCOUNTER — Encounter: Payer: Self-pay | Admitting: Family Medicine

## 2024-07-31 MED ORDER — FLUCONAZOLE 100 MG PO TABS: ORAL_TABLET | ORAL | 0 refills | Status: AC

## 2024-08-01 ENCOUNTER — Other Ambulatory Visit (HOSPITAL_COMMUNITY): Payer: Self-pay

## 2024-08-01 ENCOUNTER — Telehealth: Payer: Self-pay | Admitting: Pharmacy Technician

## 2024-08-01 NOTE — Telephone Encounter (Signed)
 Notified via MyChart. LS

## 2024-08-01 NOTE — Telephone Encounter (Signed)
 Pharmacy Patient Advocate Encounter  Received notification from [employer] that Prior Authorization for Repatha  SureClick 140MG /ML auto-injectors has been APPROVED from 08/01/24 to 08/01/25   PA #/Case ID/Reference #: [id number]

## 2024-08-01 NOTE — Telephone Encounter (Signed)
 Pharmacy Patient Advocate Encounter   Received notification from Onbase that prior authorization for Repatha  SureClick 140MG /ML auto-injectors is due for renewal.   Insurance verification completed.   The patient is insured through [employer].  Action: PA required; PA submitted to above mentioned insurance via CoverMyMeds Key/confirmation #/EOC [place] [id number] Status is pending

## 2024-08-06 ENCOUNTER — Other Ambulatory Visit: Payer: Self-pay | Admitting: Cardiology

## 2024-08-08 ENCOUNTER — Encounter: Attending: Physical Medicine and Rehabilitation | Admitting: Physical Medicine and Rehabilitation

## 2024-08-08 ENCOUNTER — Ambulatory Visit: Admitting: Physical Medicine and Rehabilitation

## 2024-08-08 DIAGNOSIS — G894 Chronic pain syndrome: Secondary | ICD-10-CM | POA: Insufficient documentation

## 2024-08-08 DIAGNOSIS — Z5181 Encounter for therapeutic drug level monitoring: Secondary | ICD-10-CM | POA: Insufficient documentation

## 2024-08-08 DIAGNOSIS — Z79891 Long term (current) use of opiate analgesic: Secondary | ICD-10-CM | POA: Insufficient documentation

## 2024-08-08 DIAGNOSIS — M797 Fibromyalgia: Secondary | ICD-10-CM | POA: Insufficient documentation

## 2024-08-08 DIAGNOSIS — M7918 Myalgia, other site: Secondary | ICD-10-CM | POA: Insufficient documentation

## 2024-08-08 DIAGNOSIS — Z6841 Body Mass Index (BMI) 40.0 and over, adult: Secondary | ICD-10-CM | POA: Insufficient documentation

## 2024-08-16 ENCOUNTER — Other Ambulatory Visit: Payer: Self-pay | Admitting: Physical Medicine and Rehabilitation

## 2024-08-16 ENCOUNTER — Other Ambulatory Visit: Payer: Self-pay | Admitting: Family Medicine

## 2024-08-16 ENCOUNTER — Other Ambulatory Visit: Payer: Self-pay | Admitting: Registered Nurse

## 2024-08-16 ENCOUNTER — Other Ambulatory Visit: Payer: Self-pay | Admitting: Cardiology

## 2024-08-16 DIAGNOSIS — G8929 Other chronic pain: Secondary | ICD-10-CM

## 2024-08-16 DIAGNOSIS — E1140 Type 2 diabetes mellitus with diabetic neuropathy, unspecified: Secondary | ICD-10-CM

## 2024-08-23 DIAGNOSIS — N3946 Mixed incontinence: Secondary | ICD-10-CM | POA: Diagnosis not present

## 2024-08-23 DIAGNOSIS — E1159 Type 2 diabetes mellitus with other circulatory complications: Secondary | ICD-10-CM | POA: Diagnosis not present

## 2024-08-23 DIAGNOSIS — E1169 Type 2 diabetes mellitus with other specified complication: Secondary | ICD-10-CM | POA: Diagnosis not present

## 2024-09-04 ENCOUNTER — Encounter: Payer: Self-pay | Admitting: Family Medicine

## 2024-09-05 ENCOUNTER — Other Ambulatory Visit: Payer: Self-pay | Admitting: Family Medicine

## 2024-09-12 ENCOUNTER — Other Ambulatory Visit: Payer: Self-pay | Admitting: Family Medicine

## 2024-09-12 DIAGNOSIS — I214 Non-ST elevation (NSTEMI) myocardial infarction: Secondary | ICD-10-CM

## 2024-09-12 DIAGNOSIS — E7800 Pure hypercholesterolemia, unspecified: Secondary | ICD-10-CM

## 2024-09-22 ENCOUNTER — Other Ambulatory Visit (HOSPITAL_COMMUNITY): Payer: Self-pay | Admitting: Family Medicine

## 2024-09-22 DIAGNOSIS — Z1231 Encounter for screening mammogram for malignant neoplasm of breast: Secondary | ICD-10-CM

## 2024-10-02 ENCOUNTER — Other Ambulatory Visit: Payer: Self-pay | Admitting: Cardiology

## 2024-10-02 DIAGNOSIS — Z7985 Long-term (current) use of injectable non-insulin antidiabetic drugs: Secondary | ICD-10-CM

## 2024-10-02 DIAGNOSIS — E1169 Type 2 diabetes mellitus with other specified complication: Secondary | ICD-10-CM

## 2024-10-03 ENCOUNTER — Ambulatory Visit: Payer: Self-pay | Admitting: Family Medicine

## 2024-10-03 ENCOUNTER — Encounter: Payer: Self-pay | Admitting: Family Medicine

## 2024-10-03 VITALS — BP 115/72 | HR 81 | Temp 97.6°F | Ht 70.0 in | Wt 386.5 lb

## 2024-10-03 DIAGNOSIS — E1140 Type 2 diabetes mellitus with diabetic neuropathy, unspecified: Secondary | ICD-10-CM | POA: Diagnosis not present

## 2024-10-03 DIAGNOSIS — Z720 Tobacco use: Secondary | ICD-10-CM

## 2024-10-03 DIAGNOSIS — Z789 Other specified health status: Secondary | ICD-10-CM

## 2024-10-03 DIAGNOSIS — F418 Other specified anxiety disorders: Secondary | ICD-10-CM

## 2024-10-03 DIAGNOSIS — E1159 Type 2 diabetes mellitus with other circulatory complications: Secondary | ICD-10-CM | POA: Diagnosis not present

## 2024-10-03 DIAGNOSIS — I2510 Atherosclerotic heart disease of native coronary artery without angina pectoris: Secondary | ICD-10-CM

## 2024-10-03 DIAGNOSIS — Z7985 Long-term (current) use of injectable non-insulin antidiabetic drugs: Secondary | ICD-10-CM

## 2024-10-03 DIAGNOSIS — I152 Hypertension secondary to endocrine disorders: Secondary | ICD-10-CM | POA: Diagnosis not present

## 2024-10-03 DIAGNOSIS — Z6841 Body Mass Index (BMI) 40.0 and over, adult: Secondary | ICD-10-CM | POA: Diagnosis not present

## 2024-10-03 DIAGNOSIS — I5032 Chronic diastolic (congestive) heart failure: Secondary | ICD-10-CM | POA: Diagnosis not present

## 2024-10-03 DIAGNOSIS — E559 Vitamin D deficiency, unspecified: Secondary | ICD-10-CM

## 2024-10-03 DIAGNOSIS — F411 Generalized anxiety disorder: Secondary | ICD-10-CM

## 2024-10-03 DIAGNOSIS — Z23 Encounter for immunization: Secondary | ICD-10-CM

## 2024-10-03 DIAGNOSIS — Z0001 Encounter for general adult medical examination with abnormal findings: Secondary | ICD-10-CM | POA: Diagnosis not present

## 2024-10-03 DIAGNOSIS — E1169 Type 2 diabetes mellitus with other specified complication: Secondary | ICD-10-CM | POA: Diagnosis not present

## 2024-10-03 DIAGNOSIS — L739 Follicular disorder, unspecified: Secondary | ICD-10-CM

## 2024-10-03 DIAGNOSIS — E119 Type 2 diabetes mellitus without complications: Secondary | ICD-10-CM

## 2024-10-03 DIAGNOSIS — Z0000 Encounter for general adult medical examination without abnormal findings: Secondary | ICD-10-CM

## 2024-10-03 LAB — BAYER DCA HB A1C WAIVED: HB A1C (BAYER DCA - WAIVED): 5.8 % — ABNORMAL HIGH (ref 4.8–5.6)

## 2024-10-03 MED ORDER — ROSUVASTATIN CALCIUM 40 MG PO TABS: 40.0000 mg | ORAL_TABLET | Freq: Every day | ORAL | 3 refills | Status: AC

## 2024-10-03 MED ORDER — FENOFIBRATE 48 MG PO TABS: 48.0000 mg | ORAL_TABLET | Freq: Every day | ORAL | 3 refills | Status: AC

## 2024-10-03 MED ORDER — REPATHA SURECLICK 140 MG/ML ~~LOC~~ SOAJ: 140.0000 mg | SUBCUTANEOUS | 3 refills | Status: AC

## 2024-10-03 MED ORDER — MUPIROCIN 2 % EX OINT: 1.0000 | TOPICAL_OINTMENT | Freq: Two times a day (BID) | CUTANEOUS | 0 refills | Status: AC

## 2024-10-03 MED ORDER — OZEMPIC (1 MG/DOSE) 4 MG/3ML ~~LOC~~ SOPN: 1.0000 mg | PEN_INJECTOR | SUBCUTANEOUS | 3 refills | Status: AC

## 2024-10-03 MED ORDER — BUSPIRONE HCL 10 MG PO TABS: 10.0000 mg | ORAL_TABLET | Freq: Two times a day (BID) | ORAL | 3 refills | Status: AC

## 2024-10-03 MED ORDER — METFORMIN HCL 1000 MG PO TABS: 1000.0000 mg | ORAL_TABLET | Freq: Every day | ORAL | 3 refills | Status: AC

## 2024-10-03 NOTE — Progress Notes (Signed)
 "  [name] is a 44 y.o. female presents to office today for annual physical exam examination.    Overall she reports that she has been doing okay.  Her pain is flared up but she is due to see Dr. Lavorn soon for injection therapies and medication management.  She notes that she had a spot on the left inner nose that has been tender to touch sometimes.  This has been ongoing for several weeks.  No reports of nasal swelling or erythema.  It comes and goes.  Denies any rectal bleeding, unplanned weight loss.  No reports of chest pain, visual disturbance or shortness of breath.  Marital status: married, Substance use: tobacco use Health Maintenance Due  Topic Date Due   Hepatitis B Vaccines 19-59 Average Risk (1 of 3 - 19+ 3-dose series) Never done   Influenza Vaccine  04/27/2024   HEMOGLOBIN A1C  09/27/2024    Immunization History  Administered Date(s) Administered   Influenza Split 10/10/2011   Influenza, Seasonal, Injecte, Preservative Fre 07/25/2023   Influenza,inj,Quad PF,6+ Mos 10/28/2017, 06/26/2018, 06/05/2019, 08/19/2020, 06/18/2021, 10/20/2022   Influenza-Unspecified 06/26/2018, 06/05/2019   PFIZER(Purple Top)SARS-COV-2 Vaccination 03/27/2020   Pneumococcal Conjugate-13 10/28/2017   Pneumococcal Polysaccharide-23 10/10/2011   Tdap 01/25/2018   Past Medical History:  Diagnosis Date   Allergy    Anemia    Anginal pain    Anxiety    Arthritis    CHF (congestive heart failure) (HCC)    Chronic asthmatic bronchitis (HCC) 08/17/2021   Chronic kidney disease    history of kidney stones   Coronary artery disease 09/2011   a.s/p BMS to LAD and angioplasty of RI in 09/2011 b. patent stent by cath in 02/2012, 12/2013, and 02/2016 with most recent showing 30-40% dLAD and 30-40% RCA stenosis   Depression    Diabetes mellitus    Dyspnea    GERD (gastroesophageal reflux disease)    History of kidney stones    Hyperlipidemia    Hypertension    Midsternal chest pain  03/03/2012   Migraines    Morbid obesity (HCC)    Myocardial infarct (HCC) 09/2011   Myocardial infarction (HCC) 02/2016   Neuromuscular disorder (HCC)    NSTEMI (non-ST elevated myocardial infarction) (HCC) 01/13/2020   Palpitations 02/17/2012   Ureteral stone with hydronephrosis    Urinary tract obstruction due to kidney stone 02/15/2017   Social History   Socioeconomic History   Marital status: Married    Spouse name: Marinell   Number of children: 2   Years of education: 10   Highest education level: Not on file  Occupational History   Not on file  Tobacco Use   Smoking status: Some Days    Current packs/day: 0.50    Average packs/day: 0.5 packs/day for 34.5 years (17.3 ttl pk-yrs)    Types: Cigarettes    Start date: 03/18/1990   Smokeless tobacco: Never   Tobacco comments:    Vapes currently   Vaping Use   Vaping status: Some Days   Substances: Nicotine   Substance and Sexual Activity   Alcohol use: Not Currently    Alcohol/week: 0.0 standard drinks of alcohol    Comment: maybe once a year   Drug use: No   Sexual activity: Yes    Birth control/protection: None    Comment: PCOS  Other Topics Concern   Not on file  Social History Narrative   Unemployed   Applied for disability   Leisure take care of house  and kids   Walks when able      She is working to get on disability. She previously worked on production designer, theatre/television/film.      Social Drivers of Health   Tobacco Use: High Risk (10/03/2024)   Patient History    Smoking Tobacco Use: Some Days    Smokeless Tobacco Use: Never    Passive Exposure: Not on file  Financial Resource Strain: Not on file  Food Insecurity: No Food Insecurity (03/27/2024)   Epic    Worried About Programme Researcher, Broadcasting/film/video in the Last Year: Never true    Ran Out of Food in the Last Year: Never true  Transportation Needs: No Transportation Needs (03/27/2024)   Epic    Lack of Transportation (Medical): No    Lack of Transportation (Non-Medical): No   Physical Activity: Not on file  Stress: Not on file  Social Connections: Moderately Isolated (03/27/2024)   Social Connection and Isolation Panel    Frequency of Communication with Friends and Family: More than three times a week    Frequency of Social Gatherings with Friends and Family: More than three times a week    Attends Religious Services: Never    Database Administrator or Organizations: No    Attends Banker Meetings: Never    Marital Status: Married  Catering Manager Violence: Not At Risk (03/27/2024)   Epic    Fear of Current or Ex-Partner: No    Emotionally Abused: No    Physically Abused: No    Sexually Abused: No  Depression (PHQ2-9): Low Risk (06/13/2024)   Depression (PHQ2-9)    PHQ-2 Score: 2  Alcohol Screen: Not on file  Housing: Low Risk (03/27/2024)   Epic    Unable to Pay for Housing in the Last Year: No    Number of Times Moved in the Last Year: 0    Homeless in the Last Year: No  Utilities: Not At Risk (03/27/2024)   Epic    Threatened with loss of utilities: No  Health Literacy: Low Risk (12/29/2021)   Received from [facility] Literacy    How often do you need to have someone help you when you read instructions, pamphlets, or other written material from your doctor or pharmacy?: Never   Past Surgical History:  Procedure Laterality Date   BIOPSY  10/27/2020   Procedure: BIOPSY;  Surgeon: Shaaron Lamar HERO, MD;  Location: [facility];  Service: Endoscopy;;   CARDIAC CATHETERIZATION  10/08/2011   LAD: 95% mid, Ramus: 95% ostial   CARPAL TUNNEL RELEASE     CESAREAN SECTION     CHOLECYSTECTOMY     CORONARY ANGIOPLASTY WITH STENT PLACEMENT  10/08/2011   LAD: BMS, Ramus: cutting balloon angioplasty   CORONARY STENT INTERVENTION N/A 01/14/2020   Procedure: CORONARY STENT INTERVENTION;  Surgeon: Anner Alm ORN, MD;  Location: [facility] INVASIVE CV LAB;  Service: Cardiovascular;  Laterality: N/A;   CYSTOSCOPY WITH RETROGRADE PYELOGRAM,  URETEROSCOPY AND STENT PLACEMENT Left 02/16/2017   Procedure: CYSTOSCOPY WITH RETROGRADE PYELOGRAM, URETEROSCOPY AND STENT PLACEMENT;  Surgeon: Ottelin, Mark, MD;  Location: [facility];  Service: Urology;  Laterality: Left;   CYSTOSCOPY WITH RETROGRADE PYELOGRAM, URETEROSCOPY AND STENT PLACEMENT Left 03/28/2017   Procedure: LEFT STENT REMOVAL LEFT RETROGRADE PYELOGRAM LEFT URETEROSCOPY   LASER LITHOTRIPSY  AND  STENT REPLACEMENT;  Surgeon: Sherrilee Belvie CROME, MD;  Location: [facility];  Service: Urology;  Laterality: Left;   CYSTOSCOPY WITH RETROGRADE PYELOGRAM,  URETEROSCOPY AND STENT PLACEMENT Left 04/13/2022   Procedure: CYSTOSCOPY WITH RETROGRADE PYELOGRAM, URETEROSCOPY AND STENT PLACEMENT, BASKET EXTRACTION;  Surgeon: Sherrilee Belvie CROME, MD;  Location: [facility];  Service: Urology;  Laterality: Left;   ESOPHAGOGASTRODUODENOSCOPY (EGD) WITH PROPOFOL  N/A 10/27/2020   Rourk: mild erosive reflux esophagitis, h.pylori gastritis   LEFT HEART CATH AND CORONARY ANGIOGRAPHY N/A 08/15/2018   Procedure: LEFT HEART CATH AND CORONARY ANGIOGRAPHY;  Surgeon: Jordan, Peter M, MD;  [facility] INVASIVE CV LAB;  Service: Cardiovascular;  Laterality: N/A;   LEFT HEART CATH AND CORONARY ANGIOGRAPHY N/A 01/14/2020   Procedure: LEFT HEART CATH AND CORONARY ANGIOGRAPHY;  Surgeon: Anner Alm ORN, MD;  Location: [facility] INVASIVE CV LAB;  Service: Cardiovascular;  Laterality: N/A;   LEFT HEART CATH AND CORONARY ANGIOGRAPHY N/A 01/18/2023   Procedure: LEFT HEART CATH AND CORONARY ANGIOGRAPHY;  Surgeon: Anner Alm ORN, MD;  Location: [facility] INVASIVE CV LAB;  Service: Cardiovascular;  Laterality: N/A;   LEFT HEART CATHETERIZATION WITH CORONARY ANGIOGRAM N/A 10/08/2011   Procedure: LEFT HEART CATHETERIZATION WITH CORONARY ANGIOGRAM;  Surgeon: Peter M Jordan, MD;  Location: [facility] CATH LAB;  Service: Cardiovascular;  Laterality: N/A;   LEFT HEART CATHETERIZATION WITH CORONARY ANGIOGRAM N/A 03/02/2012   Procedure: LEFT HEART CATHETERIZATION WITH  CORONARY ANGIOGRAM;  Surgeon: Lonni JONETTA Cash, MD;  Location: [facility] CATH LAB;  Service: Cardiovascular;  Laterality: N/A;   LEFT HEART CATHETERIZATION WITH CORONARY ANGIOGRAM N/A 01/04/2014   Procedure: LEFT HEART CATHETERIZATION WITH CORONARY ANGIOGRAM;  Surgeon: Debby DELENA Sor, MD;  Location: [facility] CATH LAB;  Service: Cardiovascular;  Laterality: N/A;   right hand pinky surgery     Family History  Problem Relation Age of Onset   Heart murmur Father    Diabetes Father    Hypertension Father    Hyperlipidemia Father    Cancer Father        throat   Diabetes Mother    Cancer Mother        breast.uterine, ovarian   Asthma Son    Appendicitis Son    Hernia Son    Mental illness Son        Bipolar, personality d/o   Stroke Maternal Grandmother    Cancer Maternal Grandmother        breast   Cancer Paternal Grandfather        lung   Colon cancer Neg Hx    Current Medications[1]  Allergies[2]   ROS: Review of Systems Pertinent items noted in HPI and remainder of comprehensive ROS otherwise negative.    Physical exam BP 115/72   Pulse 81   Temp 97.6 F (36.4 C)   Ht 5' 10 (1.778 m)   Wt (!) 386 lb 8 oz (175.3 kg)   SpO2 98%   BMI 55.46 kg/m  General appearance: alert, cooperative, appears stated age, no distress, and morbidly obese Head: Normocephalic, without obvious abnormality, atraumatic Eyes: negative findings: lids and lashes normal, conjunctivae and sclerae normal, corneas clear, and pupils equal, round, reactive to light and accomodation Ears: normal TM's and external ear canals both ears Nose: Nares normal. Septum midline. Mucosa normal. No drainage or sinus tenderness. Throat: lips, mucosa, and tongue normal; teeth and gums normal Neck: No lymphadenopathy.  No thyromegaly. Back: symmetric, no curvature. ROM normal. No CVA tenderness. Lungs: clear to auscultation bilaterally Heart: regular rate and rhythm, S1, S2 normal, no murmur, click, rub or  gallop Abdomen: Obese, nondistended Extremities: extremities normal, atraumatic, no cyanosis or edema Pulses: 2+ and symmetric  Skin: Skin color, texture, turgor normal. No rashes or lesions Lymph nodes: No supraclavicular or anterior cervical lymph node enlargement Neurologic: Alert and oriented X 3, normal strength and tone. Normal symmetric reflexes. Normal coordination and gait      10/03/2024   12:02 PM 06/13/2024   12:07 PM 03/13/2024    9:52 AM  Depression screen PHQ 2/9  Decreased Interest 1 1 1   Down, Depressed, Hopeless 1 1 1   PHQ - 2 Score 2 2 2   Altered sleeping 3  1  Tired, decreased energy 2  0  Change in appetite 2  0  Feeling bad or failure about yourself  1  1  Trouble concentrating 1  0  Moving slowly or fidgety/restless 0  0  Suicidal thoughts 0  0  PHQ-9 Score 11  4   Difficult doing work/chores Somewhat difficult  Somewhat difficult     Data saved with a previous flowsheet row definition      10/03/2024   12:02 PM 03/13/2024    9:52 AM 10/26/2023   11:33 AM 07/25/2023    8:06 AM  GAD 7 : Generalized Anxiety Score  Nervous, Anxious, on Edge 1 1 1 1   Control/stop worrying 1 1 1 1   Worry too much - different things 1 0 1 1  Trouble relaxing 2 0 2 2  Restless 1 0 0 0  Easily annoyed or irritable 1 2 2 2   Afraid - awful might happen 0 0 0 1  Total GAD 7 Score 7 4 7 8   Anxiety Difficulty Somewhat difficult Somewhat difficult Somewhat difficult Somewhat difficult    Recent Results (from the past 2160 hours)  OPHTHALMOLOGY REPORT-SCANNED     Status: None   Collection Time: 07/12/24  7:20 AM  Result Value Ref Range   HM Diabetic Eye Exam No Retinopathy No Retinopathy    Comment: abst by him   A Comment    CBC with Differential     Status: Abnormal   Collection Time: 07/23/24  3:29 PM  Result Value Ref Range   WBC 17.7 (H) 4.0 - 10.5 K/uL   RBC 3.90 3.87 - 5.11 MIL/uL   Hemoglobin 12.2 12.0 - 15.0 g/dL   HCT 63.6 63.9 - 53.9 %   MCV 93.1 80.0 - 100.0 fL    MCH 31.3 26.0 - 34.0 pg   MCHC 33.6 30.0 - 36.0 g/dL   RDW 86.9 88.4 - 84.4 %   Platelets 253 150 - 400 K/uL   nRBC 0.0 0.0 - 0.2 %   Neutrophils Relative % 84 %   Neutro Abs 15.0 (H) 1.7 - 7.7 K/uL   Lymphocytes Relative 9 %   Lymphs Abs 1.6 0.7 - 4.0 K/uL   Monocytes Relative 5 %   Monocytes Absolute 0.8 0.1 - 1.0 K/uL   Eosinophils Relative 1 %   Eosinophils Absolute 0.1 0.0 - 0.5 K/uL   Basophils Relative 0 %   Basophils Absolute 0.0 0.0 - 0.1 K/uL   Immature Granulocytes 1 %   Abs Immature Granulocytes 0.14 (H) 0.00 - 0.07 K/uL    Comment: Performed at [facility], [address] [place], [place] [address]  Comprehensive metabolic panel     Status: Abnormal   Collection Time: 07/23/24  3:29 PM  Result Value Ref Range   Sodium 134 (L) 135 - 145 mmol/L   Potassium 3.7 3.5 - 5.1 mmol/L   Chloride 103 98 - 111 mmol/L   CO2 18 (L) 22 -  32 mmol/L   Glucose, Bld 137 (H) 70 - 99 mg/dL    Comment: Glucose reference range applies only to samples taken after fasting for at least 8 hours.   BUN 14 6 - 20 mg/dL   Creatinine, Ser 9.24 0.44 - 1.00 mg/dL   Calcium  9.4 8.9 - 10.3 mg/dL   Total Protein 7.3 6.5 - 8.1 g/dL   Albumin 3.7 3.5 - 5.0 g/dL   AST 17 15 - 41 U/L   ALT 15 0 - 44 U/L   Alkaline Phosphatase 50 38 - 126 U/L   Total Bilirubin 0.6 0.0 - 1.2 mg/dL   GFR, Estimated >39 >39 mL/min    Comment: (NOTE) Calculated using the CKD-EPI Creatinine Equation (2021)    Anion gap 13 5 - 15    Comment: Performed at [facility], [address] [place], [place] [address]  Lipase, blood     Status: None   Collection Time: 07/23/24  3:29 PM  Result Value Ref Range   Lipase 22 11 - 51 U/L    Comment: Performed at [facility], [address], [place], [place] [address]  Urinalysis, Routine w reflex microscopic -Urine, Clean Catch     Status: Abnormal   Collection Time: 07/23/24  7:53 PM  Result Value Ref Range   Color, Urine AMBER (A) YELLOW    Comment: BIOCHEMICALS MAY BE  AFFECTED BY COLOR   APPearance CLOUDY (A) CLEAR   Specific Gravity, Urine 1.021 1.005 - 1.030   pH 5.0 5.0 - 8.0   Glucose, UA NEGATIVE NEGATIVE mg/dL   Hgb urine dipstick SMALL (A) NEGATIVE   Bilirubin Urine NEGATIVE NEGATIVE   Ketones, ur NEGATIVE NEGATIVE mg/dL   Protein, ur 899 (A) NEGATIVE mg/dL   Nitrite POSITIVE (A) NEGATIVE   Leukocytes,Ua MODERATE (A) NEGATIVE   RBC / HPF 0-5 0 - 5 RBC/hpf   WBC, UA >50 0 - 5 WBC/hpf   Bacteria, UA MANY (A) NONE SEEN   Squamous Epithelial / HPF 6-10 0 - 5 /HPF   Mucus PRESENT     Comment: Performed at [facility], [address], [place], [place] [address]  POC urine preg, ED     Status: None   Collection Time: 07/23/24  7:56 PM  Result Value Ref Range   Preg Test, Ur NEGATIVE NEGATIVE    Comment:        THE SENSITIVITY OF THIS METHODOLOGY IS >20 mIU/mL.      Assessment/ Plan: [name] here for annual physical exam.   Annual physical exam  Diabetes mellitus treated with injections of non-insulin  medication (HCC) - Plan: Bayer DCA Hb A1c Waived, CMP14+EGFR, Microalbumin / creatinine urine ratio, metFORMIN  (GLUCOPHAGE ) 1000 MG tablet, Semaglutide , 1 MG/DOSE, (OZEMPIC , 1 MG/DOSE,) 4 MG/3ML SOPN  Hyperlipidemia associated with type 2 diabetes mellitus (HCC) - Plan: CMP14+EGFR, Lipid Panel, TSH, fenofibrate  (TRICOR ) 48 MG tablet, Semaglutide , 1 MG/DOSE, (OZEMPIC , 1 MG/DOSE,) 4 MG/3ML SOPN, Evolocumab  (REPATHA  SURECLICK) 140 MG/ML SOAJ, rosuvastatin  (CRESTOR ) 40 MG tablet  Hypertension associated with diabetes (HCC) - Plan: CMP14+EGFR  Diabetic neuropathy, painful (HCC) - Plan: CMP14+EGFR  Coronary artery disease involving native coronary artery of native heart without angina pectoris - Plan: CMP14+EGFR, Lipid Panel, TSH  BMI 50.0-59.9, adult (HCC) - Plan: CMP14+EGFR, VITAMIN D  25 Hydroxy (Vit-D Deficiency, Fractures)  Chronic diastolic CHF (congestive heart failure) (HCC) - Plan: CMP14+EGFR, CBC with Differential  Tobacco  abuse - Plan: CBC with Differential  Generalized anxiety disorder - Plan: busPIRone  (BUSPAR ) 10  MG tablet  Situational anxiety - Plan: busPIRone  (BUSPAR ) 10 MG tablet  Hepatitis B vaccination status unknown - Plan: Hepatitis B surface antibody,quantitative  Immunization due - Plan: Pneumococcal conjugate vaccine 20-valent (Prevnar 20), Flu vaccine trivalent PF, 6mos and older(Flulaval,Afluria,Fluarix,Fluzone)  Nasal folliculitis - Plan: mupirocin  ointment (BACTROBAN ) 2 %   Medications renewed.  Her PHQ and GAD-7 scores are elevated this may be situational as she has a lot going on at home.  Hopefully when she is done with the holidays things will settle down but if not, low threshold to initiate additional intervention.  She is on both duloxetine  and fluoxetine  as well as BuSpar .  May need to consider referral to integrated behavioral health services for counseling sessions and/or referral to psychiatry for medication management.  Fasting labs were collected today.  Will CC results to cardiology given CAD.  BMI remains high risk despite use of GLP.  May need to strongly consider switching her to Mounjaro instead even though the GLP has strong the data from a cardiovascular risk reduction.  Will check hepatitis B titers per her request.  Pneumococcal and influenza vaccinations administered.  I sent over Bactroban  ointment for presumed nasal folliculitis but exam was largely unremarkable today  Counseled on healthy lifestyle choices, including diet (rich in fruits, vegetables and lean meats and low in salt and simple carbohydrates) and exercise (at least 30 minutes of moderate physical activity daily).  Patient to follow up 4-30m for DM  Kirti Carl M. Caitlynn Ju, DO        [1]  Current Outpatient Medications:    Accu-Chek Softclix Lancets lancets, CHECK BOOD SUGAR FOUR TIMES DAILY Dx E11.9, Disp: 400 each, Rfl: 3   acetaminophen  (TYLENOL ) 500 MG tablet, Take 1,000 mg by mouth every 6 (six)  hours as needed for moderate pain., Disp: , Rfl:    albuterol  (VENTOLIN  HFA) 108 (90 Base) MCG/ACT inhaler, Inhale 2 puffs into the lungs every 4 (four) hours as needed for wheezing or shortness of breath., Disp: 8 g, Rfl: 2   amLODipine  (NORVASC ) 5 MG tablet, TAKE 1 TABLET BY MOUTH DAILY, Disp: 90 tablet, Rfl: 3   aspirin  81 MG chewable tablet, Chew 81 mg by mouth daily., Disp: , Rfl:    bisoprolol  (ZEBETA ) 5 MG tablet, TAKE 1 TABLET BY MOUTH TWICE DAILY, Disp: 180 tablet, Rfl: 3   blood glucose meter kit and supplies, Dispense based on patient and insurance preference. Use up to four times daily as directed. (FOR ICD-10:  E11.22). Check BG daily, Disp: 1 each, Rfl: 0   Blood Glucose Monitoring Suppl (ACCU-CHEK GUIDE) w/Device KIT, Test BS 4 times daily Dx E11.9, Disp: 1 kit, Rfl: 0   budesonide -formoterol  (SYMBICORT ) 160-4.5 MCG/ACT inhaler, INHALE TWO PUFFS first thing IN THE MORNING AND THEN INHALE another 2 PUFFS about 12 HOURS LATER, Disp: 10.2 g, Rfl: 11   clopidogrel  (PLAVIX ) 75 MG tablet, TAKE 1 TABLET BY MOUTH EVERY DAY, Disp: 90 tablet, Rfl: 2   cyclobenzaprine  (FLEXERIL ) 10 MG tablet, Take 1 tablet (10 mg total) by mouth at bedtime as needed for muscle spasms., Disp: 30 tablet, Rfl: 5   DULoxetine  (CYMBALTA ) 60 MG capsule, TAKE ONE CAPSULE BY MOUTH AT BEDTIME, Disp: 90 capsule, Rfl: 1   ezetimibe  (ZETIA ) 10 MG tablet, TAKE 1 TABLET BY MOUTH EVERY DAY, Disp: 90 tablet, Rfl: 3   FLUoxetine  (PROZAC ) 20 MG capsule, TAKE ONE CAPSULE BY MOUTH EVERY DAY GIVE WITH CYMBALTA  DUE TO NERVE PAIN AND ANXIETY/DEPRESSION, Disp: 90 capsule, Rfl: 1  glucose blood (ACCU-CHEK GUIDE TEST) test strip, CHECK BOOD SUGAR FOUR TIMES DAILY Dx E11.9, Disp: 400 strip, Rfl: 3   icosapent  Ethyl (VASCEPA ) 1 g capsule, TAKE TWO CAPSULES BY MOUTH TWICE DAILY, Disp: 360 capsule, Rfl: 2   losartan  (COZAAR ) 25 MG tablet, Take 0.5 tablets (12.5 mg total) by mouth daily., Disp: 45 tablet, Rfl: 3   nitroGLYCERIN  (NITROSTAT )  0.4 MG SL tablet, DISSOLVE 1 TABLET UNDER THE TONGUE EVERY 5 MINUTES AS NEEDED FOR CHEST PAIN. DO NOT EXCEED A TOTAL OF 3 DOSES IN 15 MINUTES., Disp: 25 tablet, Rfl: 3   potassium chloride  SA (KLOR-CON  M) 20 MEQ tablet, TAKE 1 TABLET BY MOUTH EVERY DAY AS NEEDED (when taking torsemide ), Disp: 90 tablet, Rfl: 3   pregabalin  (LYRICA ) 150 MG capsule, TAKE ONE CAPSULE BY MOUTH THREE TIMES DAILY, Disp: 90 capsule, Rfl: 5   ranolazine  (RANEXA ) 1000 MG SR tablet, TAKE 1 TABLET BY MOUTH TWICE DAILY, Disp: 180 tablet, Rfl: 2   torsemide  (DEMADEX ) 20 MG tablet, TAKE 2 TABLETS BY MOUTH DAILY AS NEEDED FOR swelling, Disp: 180 tablet, Rfl: 3   traMADol  (ULTRAM ) 50 MG tablet, TAKE 2 TABLETS BY MOUTH TWICE DAILY, Disp: 120 tablet, Rfl: 5   busPIRone  (BUSPAR ) 10 MG tablet, Take 1 tablet (10 mg total) by mouth 2 (two) times daily., Disp: 180 tablet, Rfl: 3   calcium  carbonate (TUMS - DOSED IN MG ELEMENTAL CALCIUM ) 500 MG chewable tablet, Chew 1 tablet by mouth daily as needed for indigestion or heartburn. (Patient not taking: Reported on 10/03/2024), Disp: , Rfl:    Evolocumab  (REPATHA  SURECLICK) 140 MG/ML SOAJ, Inject 140 mg into the skin every 14 (fourteen) days., Disp: 6 mL, Rfl: 3   fenofibrate  (TRICOR ) 48 MG tablet, Take 1 tablet (48 mg total) by mouth daily., Disp: 90 tablet, Rfl: 3   fluconazole  (DIFLUCAN ) 100 MG tablet, Take two with first dose. Then starting the next day take one daily until all are taken. (Patient not taking: Reported on 10/03/2024), Disp: 15 tablet, Rfl: 0   medroxyPROGESTERone  (PROVERA ) 5 MG tablet, TAKE 1 TABLET BY MOUTH DAILY (Patient not taking: Reported on 10/03/2024), Disp: 90 tablet, Rfl: 0   metFORMIN  (GLUCOPHAGE ) 1000 MG tablet, Take 1 tablet (1,000 mg total) by mouth daily with breakfast., Disp: 90 tablet, Rfl: 3   rosuvastatin  (CRESTOR ) 40 MG tablet, Take 1 tablet (40 mg total) by mouth daily., Disp: 90 tablet, Rfl: 3   Semaglutide , 1 MG/DOSE, (OZEMPIC , 1 MG/DOSE,) 4 MG/3ML SOPN, Inject  1 mg into the skin every 7 (seven) days., Disp: 9 mL, Rfl: 3  Current Facility-Administered Medications:    lidocaine  (XYLOCAINE ) 1 % (with pres) injection 3 mL, 3 mL, Other, Once,  [2]  Allergies Allergen Reactions   Bee Venom Anaphylaxis and Swelling   Misc. Sulfonamide Containing Compounds Nausea And Vomiting   Sulfa Drugs Cross Reactors Nausea And Vomiting   Latex Itching   "

## 2024-10-04 LAB — CBC WITH DIFFERENTIAL/PLATELET
Basophils Absolute: 0 x10E3/uL (ref 0.0–0.2)
Basos: 0 %
EOS (ABSOLUTE): 0.1 x10E3/uL (ref 0.0–0.4)
Eos: 1 %
Hematocrit: 43.7 % (ref 34.0–46.6)
Hemoglobin: 14.3 g/dL (ref 11.1–15.9)
Immature Grans (Abs): 0.1 x10E3/uL (ref 0.0–0.1)
Immature Granulocytes: 1 %
Lymphocytes Absolute: 2.9 x10E3/uL (ref 0.7–3.1)
Lymphs: 26 %
MCH: 30.5 pg (ref 26.6–33.0)
MCHC: 32.7 g/dL (ref 31.5–35.7)
MCV: 93 fL (ref 79–97)
Monocytes Absolute: 0.8 x10E3/uL (ref 0.1–0.9)
Monocytes: 7 %
Neutrophils Absolute: 7.2 x10E3/uL — ABNORMAL HIGH (ref 1.4–7.0)
Neutrophils: 65 %
Platelets: 313 x10E3/uL (ref 150–450)
RBC: 4.69 x10E6/uL (ref 3.77–5.28)
RDW: 13 % (ref 11.7–15.4)
WBC: 11.1 x10E3/uL — ABNORMAL HIGH (ref 3.4–10.8)

## 2024-10-04 LAB — CMP14+EGFR
ALT: 13 IU/L (ref 0–32)
AST: 16 IU/L (ref 0–40)
Albumin: 4.2 g/dL (ref 3.9–4.9)
Alkaline Phosphatase: 63 IU/L (ref 41–116)
BUN/Creatinine Ratio: 10 (ref 9–23)
BUN: 8 mg/dL (ref 6–24)
Bilirubin Total: 0.4 mg/dL (ref 0.0–1.2)
CO2: 18 mmol/L — ABNORMAL LOW (ref 20–29)
Calcium: 9.3 mg/dL (ref 8.7–10.2)
Chloride: 104 mmol/L (ref 96–106)
Creatinine, Ser: 0.79 mg/dL (ref 0.57–1.00)
Globulin, Total: 2.8 g/dL (ref 1.5–4.5)
Glucose: 108 mg/dL — ABNORMAL HIGH (ref 70–99)
Potassium: 4.3 mmol/L (ref 3.5–5.2)
Sodium: 136 mmol/L (ref 134–144)
Total Protein: 7 g/dL (ref 6.0–8.5)
eGFR: 94 mL/min/1.73

## 2024-10-04 LAB — LIPID PANEL
Chol/HDL Ratio: 2.8 ratio (ref 0.0–4.4)
Cholesterol, Total: 111 mg/dL (ref 100–199)
HDL: 39 mg/dL — ABNORMAL LOW
LDL Chol Calc (NIH): 52 mg/dL (ref 0–99)
Triglycerides: 106 mg/dL (ref 0–149)
VLDL Cholesterol Cal: 20 mg/dL (ref 5–40)

## 2024-10-04 LAB — MICROALBUMIN / CREATININE URINE RATIO
Creatinine, Urine: 238.4 mg/dL
Microalb/Creat Ratio: 176 mg/g{creat} — ABNORMAL HIGH (ref 0–29)
Microalbumin, Urine: 419.9 ug/mL

## 2024-10-04 LAB — TSH: TSH: 0.972 u[IU]/mL (ref 0.450–4.500)

## 2024-10-04 LAB — HEPATITIS B SURFACE ANTIBODY, QUANTITATIVE: Hepatitis B Surf Ab Quant: <3.5 m[IU]/mL — ABNORMAL LOW

## 2024-10-04 LAB — VITAMIN D 25 HYDROXY (VIT D DEFICIENCY, FRACTURES): Vit D, 25-Hydroxy: 10 ng/mL — ABNORMAL LOW (ref 30.0–100.0)

## 2024-10-05 ENCOUNTER — Encounter: Attending: Physical Medicine and Rehabilitation | Admitting: Physical Medicine and Rehabilitation

## 2024-10-05 ENCOUNTER — Encounter: Payer: Self-pay | Admitting: Physical Medicine and Rehabilitation

## 2024-10-05 VITALS — BP 112/75 | HR 82 | Ht 70.0 in | Wt 388.2 lb

## 2024-10-05 DIAGNOSIS — M7918 Myalgia, other site: Secondary | ICD-10-CM | POA: Diagnosis present

## 2024-10-05 DIAGNOSIS — M5442 Lumbago with sciatica, left side: Secondary | ICD-10-CM | POA: Diagnosis not present

## 2024-10-05 DIAGNOSIS — M5441 Lumbago with sciatica, right side: Secondary | ICD-10-CM | POA: Insufficient documentation

## 2024-10-05 DIAGNOSIS — M67959 Unspecified disorder of synovium and tendon, unspecified thigh: Secondary | ICD-10-CM | POA: Insufficient documentation

## 2024-10-05 DIAGNOSIS — G8929 Other chronic pain: Secondary | ICD-10-CM | POA: Insufficient documentation

## 2024-10-05 DIAGNOSIS — M797 Fibromyalgia: Secondary | ICD-10-CM | POA: Diagnosis not present

## 2024-10-05 MED ORDER — LIDOCAINE HCL 1 % IJ SOLN
9.0000 mL | Freq: Once | INTRAMUSCULAR | Status: AC
  Administered 2024-10-05: 9 mL

## 2024-10-05 MED ORDER — VITAMIN D (ERGOCALCIFEROL) 1.25 MG (50000 UNIT) PO CAPS: 50000.0000 [IU] | ORAL_CAPSULE | ORAL | 3 refills | Status: AC

## 2024-10-05 MED ORDER — CYCLOBENZAPRINE HCL 10 MG PO TABS: 10.0000 mg | ORAL_TABLET | Freq: Every day | ORAL | 1 refills | Status: AC

## 2024-10-05 MED ORDER — TRAMADOL HCL 50 MG PO TABS: 100.0000 mg | ORAL_TABLET | Freq: Three times a day (TID) | ORAL | 5 refills | Status: AC

## 2024-10-05 NOTE — Patient Instructions (Signed)
 Plan:   Started having menopause- 5 months ago was LMP-  is likely to be at least contributing to why she's hrting more with Estrogen loss causing  gluteus medius tendinitis-   2. Vit D deficiency- Once you finish 50,000 units 1 x/week- you need to be on a MINIMUM of 5000 units/day-  goal is ~50-60- and normal is 30-100.  Then we will recheck in 6 months, and then see if you need 10,00 units/day over the counter   3.  Orthopedics referral for Gluteus medius tendinopathy injections B/L for lateral hip pain   4. Worth talking to Gyn about  estrogen replacement therapy- but grandmother had breast CA in 40's per pt and mother in 20's- so cannot have HRT, most likely.. (Hormone replacement therapy).  5. Patient here for trigger point injections for  Consent done and on chart.  Cleaned areas with alcohol and injected using a 27 gauge 1.5 inch needle  Injected 9cc- none wasted Using 1% Lidocaine  with no EPI  Upper traps B/L x2 Levators B/L  Posterior scalenes Middle scalenes B/L  Splenius Capitus- b/L  Pectoralis Major- B/L  Rhomboids- B/L x3 Infraspinatus Teres Major/minor Thoracic paraspinals B/L x3 Lumbar paraspinals - B/L  Other injections- B/L extensor part of forearms   There was no bleeding or complications.  Patient was advised to drink a lot of water  on day after injections to flush system Will have increased soreness for 12-48 hours after injections.  Can use Lidocaine  patches the day AFTER injections Can use theracane on day of injections in places didn't inject Can use heating pad 4-6 hours AFTER injections   6.  Takes Tramadol  100 mg 2x/day-  try to increase Tramadol  to 3x/day- 100 mg 3x/day- sent in for pain control.   7.  Con't Lyrica  150 mg 3x/day- doesn't need refills   8. Last Oral drug screen  9/25- so not due today.    9. Con't Prozac - 20 mg daily- and Duloxetine - 60 mg- cannot increase either, since on Both-  10. Let's try Flexeril - 10 mg at bedtime-   had stopped working, had worked prior- but had stopped- so maybe will restart helping since been off for 2+ months.   11. F/U on MPS, FMS and chronic pain syndrome With Trp Injections.

## 2024-10-05 NOTE — Progress Notes (Signed)
 Pt is a 44 yr old female with BMI of 56- weight 390 lbs- down to 371 lbs today, DM, GAD, HTN,  and  CHF- chronic pain- Appears to have L T8 foraminal thoracic stenosis.  As well as fibromyalgia and myofascial pain syndrome.   Here for f/u on Chronic pain and trigger point injections.    Has been going on for 1-2 months, pain getting worse.   Missed last appt in 11/25.   Been going on since November.   Meds aren't working' -tylenol  not touching it.  And tylenol  arthritis isn't helping either.     Was taking Flexeril  at night- but felt like wasn't helping much. So stopped.  Stopped in November midway.  When pain has gotten worse.   Hips are hurting more- has gained 10 lbs.   Lateral hip area  Has had bursitis in past and gotten trochanteric bursitis  injections- did help the hip pain.   Doesn't see ortho- currently, but wants to see Dr Onesimo.     Not having cycle- stopped 5 months ago-  So   Vit D level also 10-  Sent Rx for 50k units of Vit D 1x/week.  Just checked.   Cannot take estrogen due ot breast CA in family at young age and strokes, but can take and has taken progesterone in past-    Got new bed- helix mattress Hybrid memory foam and springs-  Causing more pain to get off it.     Exam: Awake, alert, appropriate, appears ot have gained some weight, NAD Very TTP over gluteus medius tendons B/L-  Almost impossible to turn more than 10 degrees of cervical rotation.    Plan:   Started having menopause- 5 months ago was LMP-  is likely to be at least contributing to why she's hrting more with Estrogen loss causing  gluteus medius tendinitis-   2. Vit D deficiency- Once you finish 50,000 units 1 x/week- you need to be on a MINIMUM of 5000 units/day-  goal is ~50-60- and normal is 30-100.  Then we will recheck in 6 months, and then see if you need 10,00 units/day over the counter   3.  Orthopedics referral for Gluteus medius tendinopathy injections B/L for  lateral hip pain   4. Worth talking to Gyn about  estrogen replacement therapy- but grandmother had breast CA in 40's per pt and mother in 20's- so cannot have HRT, most likely.. (Hormone replacement therapy).  5. Patient here for trigger point injections for  Consent done and on chart.  Cleaned areas with alcohol and injected using a 27 gauge 1.5 inch needle  Injected 9cc- none wasted Using 1% Lidocaine  with no EPI  Upper traps B/L x2 Levators B/L  Posterior scalenes Middle scalenes B/L  Splenius Capitus- b/L  Pectoralis Major- B/L  Rhomboids- B/L x3 Infraspinatus Teres Major/minor Thoracic paraspinals B/L x3 Lumbar paraspinals - B/L  Other injections- B/L extensor part of forearms   There was no bleeding or complications.  Patient was advised to drink a lot of water  on day after injections to flush system Will have increased soreness for 12-48 hours after injections.  Can use Lidocaine  patches the day AFTER injections Can use theracane on day of injections in places didn't inject Can use heating pad 4-6 hours AFTER injections   6.  Takes Tramadol  100 mg 2x/day-  try to increase Tramadol  to 3x/day- 100 mg 3x/day- sent in for pain control.   7.  Con't Lyrica  150 mg 3x/day-  doesn't need refills   8. Last Oral drug screen  9/25- so not due today.    9. Con't Prozac - 20 mg daily- and Duloxetine - 60 mg- cannot increase either, since on Both-  10. Let's try Flexeril - 10 mg at bedtime-  had stopped working, had worked prior- but had stopped- so maybe will restart helping since been off for 2+ months.   11. F/U on MPS, FMS and chronic pain syndrome With Trp Injections.    I spent a total of 59   minutes on total care today- >50% coordination of care- due to f/u on pain and issues as detailed above- Vit D def, menopause and trp injections were 9 minutes- answered a lot of questions asdetaile dabove.

## 2024-10-10 NOTE — Progress Notes (Signed)
 Thank you. I will follow-up.   Delon Hope, AGNP-C Department of Hematology/Oncology [facility]  Phone: [phone]  10/10/2024 8:24 AM

## 2024-10-18 ENCOUNTER — Inpatient Hospital Stay (HOSPITAL_COMMUNITY): Admission: RE | Admit: 2024-10-18 | Source: Ambulatory Visit

## 2024-10-24 ENCOUNTER — Telehealth: Payer: Self-pay

## 2024-10-24 NOTE — Telephone Encounter (Signed)
 Tramadol  50 MG - Your information has been sent to [facility] [place]  Medicaid.

## 2024-10-26 ENCOUNTER — Ambulatory Visit (HOSPITAL_COMMUNITY)
Admission: RE | Admit: 2024-10-26 | Discharge: 2024-10-26 | Disposition: A | Source: Ambulatory Visit | Attending: Family Medicine

## 2024-10-26 ENCOUNTER — Other Ambulatory Visit (HOSPITAL_COMMUNITY): Payer: Self-pay

## 2024-10-26 DIAGNOSIS — Z1231 Encounter for screening mammogram for malignant neoplasm of breast: Secondary | ICD-10-CM | POA: Insufficient documentation

## 2024-10-26 NOTE — Telephone Encounter (Signed)
 PA denied. Sent again with contract and marked urgent on 10/27/2023. Per [facility] there is a 24-48 hour turn around reply.

## 2024-10-29 ENCOUNTER — Other Ambulatory Visit (HOSPITAL_COMMUNITY): Payer: Self-pay

## 2024-11-02 ENCOUNTER — Ambulatory Visit: Admitting: Orthopedic Surgery

## 2024-11-16 ENCOUNTER — Ambulatory Visit: Admitting: Orthopedic Surgery

## 2024-11-30 ENCOUNTER — Ambulatory Visit: Admitting: Cardiology

## 2024-11-30 ENCOUNTER — Ambulatory Visit: Admitting: Physical Medicine and Rehabilitation

## 2024-12-27 ENCOUNTER — Other Ambulatory Visit

## 2025-01-02 ENCOUNTER — Ambulatory Visit: Admitting: Dermatology

## 2025-01-03 ENCOUNTER — Ambulatory Visit: Admitting: Oncology

## 2025-01-03 ENCOUNTER — Inpatient Hospital Stay: Admitting: Oncology

## 2025-01-08 ENCOUNTER — Inpatient Hospital Stay: Admitting: Oncology

## 2025-02-01 ENCOUNTER — Encounter: Admitting: Physical Medicine and Rehabilitation

## 2025-04-05 ENCOUNTER — Ambulatory Visit: Admitting: Family Medicine

## 2025-04-08 ENCOUNTER — Encounter: Admitting: Physical Medicine and Rehabilitation

## 2025-06-10 ENCOUNTER — Encounter: Admitting: Physical Medicine and Rehabilitation
